# Patient Record
Sex: Female | Born: 1965
Health system: Southern US, Community
[De-identification: ages and names within clinical notes are randomized; demographics above are authoritative.]

## PROBLEM LIST (undated history)

## (undated) DIAGNOSIS — Z21 Asymptomatic human immunodeficiency virus [HIV] infection status: Secondary | ICD-10-CM

## (undated) DIAGNOSIS — I739 Peripheral vascular disease, unspecified: Secondary | ICD-10-CM

## (undated) DIAGNOSIS — Z973 Presence of spectacles and contact lenses: Secondary | ICD-10-CM

## (undated) DIAGNOSIS — E119 Type 2 diabetes mellitus without complications: Secondary | ICD-10-CM

## (undated) DIAGNOSIS — I499 Cardiac arrhythmia, unspecified: Secondary | ICD-10-CM

## (undated) DIAGNOSIS — N289 Disorder of kidney and ureter, unspecified: Secondary | ICD-10-CM

## (undated) DIAGNOSIS — C801 Malignant (primary) neoplasm, unspecified: Secondary | ICD-10-CM

## (undated) DIAGNOSIS — B20 Human immunodeficiency virus [HIV] disease: Secondary | ICD-10-CM

## (undated) DIAGNOSIS — T8859XA Other complications of anesthesia, initial encounter: Secondary | ICD-10-CM

## (undated) DIAGNOSIS — J45909 Unspecified asthma, uncomplicated: Secondary | ICD-10-CM

## (undated) DIAGNOSIS — H548 Legal blindness, as defined in USA: Secondary | ICD-10-CM

## (undated) DIAGNOSIS — Z8619 Personal history of other infectious and parasitic diseases: Secondary | ICD-10-CM

## (undated) DIAGNOSIS — Z9889 Other specified postprocedural states: Secondary | ICD-10-CM

## (undated) DIAGNOSIS — R002 Palpitations: Secondary | ICD-10-CM

## (undated) DIAGNOSIS — N939 Abnormal uterine and vaginal bleeding, unspecified: Secondary | ICD-10-CM

## (undated) DIAGNOSIS — M1990 Unspecified osteoarthritis, unspecified site: Secondary | ICD-10-CM

## (undated) DIAGNOSIS — I10 Essential (primary) hypertension: Secondary | ICD-10-CM

## (undated) DIAGNOSIS — A159 Respiratory tuberculosis unspecified: Secondary | ICD-10-CM

## (undated) DIAGNOSIS — D071 Carcinoma in situ of vulva: Secondary | ICD-10-CM

## (undated) DIAGNOSIS — E7800 Pure hypercholesterolemia, unspecified: Secondary | ICD-10-CM

## (undated) DIAGNOSIS — Q248 Other specified congenital malformations of heart: Secondary | ICD-10-CM

## (undated) DIAGNOSIS — J189 Pneumonia, unspecified organism: Secondary | ICD-10-CM

## (undated) DIAGNOSIS — D259 Leiomyoma of uterus, unspecified: Secondary | ICD-10-CM

## (undated) DIAGNOSIS — N182 Chronic kidney disease, stage 2 (mild): Secondary | ICD-10-CM

## (undated) DIAGNOSIS — K219 Gastro-esophageal reflux disease without esophagitis: Secondary | ICD-10-CM

## (undated) DIAGNOSIS — R7989 Other specified abnormal findings of blood chemistry: Secondary | ICD-10-CM

## (undated) HISTORY — PX: EYE SURGERY: SHX253

---

## 2002-12-18 DIAGNOSIS — G510 Bell's palsy: Secondary | ICD-10-CM

## 2002-12-18 HISTORY — PX: OTHER SURGICAL HISTORY: SHX169

## 2002-12-18 HISTORY — DX: Bell's palsy: G51.0

## 2004-12-18 HISTORY — PX: CORNEAL TRANSPLANT: SHX108

## 2006-12-18 HISTORY — PX: KNEE ARTHROSCOPY W/ ACL RECONSTRUCTION: SHX1858

## 2015-01-02 ENCOUNTER — Emergency Department (HOSPITAL_BASED_OUTPATIENT_CLINIC_OR_DEPARTMENT_OTHER)
Admission: EM | Admit: 2015-01-02 | Discharge: 2015-01-02 | Disposition: A | Discharge status: Home / Self Care | Payer: Medicare Other | Attending: Emergency Medicine | Admitting: Emergency Medicine

## 2015-01-02 ENCOUNTER — Encounter (HOSPITAL_BASED_OUTPATIENT_CLINIC_OR_DEPARTMENT_OTHER): Payer: Self-pay | Admitting: Emergency Medicine

## 2015-01-02 DIAGNOSIS — R519 Headache, unspecified: Secondary | ICD-10-CM

## 2015-01-02 DIAGNOSIS — I10 Essential (primary) hypertension: Secondary | ICD-10-CM | POA: Diagnosis not present

## 2015-01-02 DIAGNOSIS — Z794 Long term (current) use of insulin: Secondary | ICD-10-CM | POA: Insufficient documentation

## 2015-01-02 DIAGNOSIS — R51 Headache: Secondary | ICD-10-CM | POA: Diagnosis present

## 2015-01-02 DIAGNOSIS — E1165 Type 2 diabetes mellitus with hyperglycemia: Secondary | ICD-10-CM | POA: Insufficient documentation

## 2015-01-02 DIAGNOSIS — Z79899 Other long term (current) drug therapy: Secondary | ICD-10-CM | POA: Insufficient documentation

## 2015-01-02 DIAGNOSIS — R030 Elevated blood-pressure reading, without diagnosis of hypertension: Secondary | ICD-10-CM

## 2015-01-02 DIAGNOSIS — IMO0001 Reserved for inherently not codable concepts without codable children: Secondary | ICD-10-CM

## 2015-01-02 DIAGNOSIS — Z760 Encounter for issue of repeat prescription: Secondary | ICD-10-CM | POA: Diagnosis not present

## 2015-01-02 DIAGNOSIS — R739 Hyperglycemia, unspecified: Secondary | ICD-10-CM

## 2015-01-02 HISTORY — DX: Type 2 diabetes mellitus without complications: E11.9

## 2015-01-02 HISTORY — DX: Essential (primary) hypertension: I10

## 2015-01-02 LAB — CBG MONITORING, ED: Glucose-Capillary: 275 mg/dL — ABNORMAL HIGH (ref 70–99)

## 2015-01-02 MED ORDER — LISINOPRIL 10 MG PO TABS: 10.0000 mg | ORAL_TABLET | Freq: Every day | ORAL | Status: DC

## 2015-01-02 MED ORDER — ACETAMINOPHEN 500 MG PO TABS
1000.0000 mg | ORAL_TABLET | Freq: Once | ORAL | Status: AC
  Administered 2015-01-02: 1000 mg via ORAL
  Filled 2015-01-02: qty 2

## 2015-01-02 MED ORDER — INSULIN NPH ISOPHANE & REGULAR (70-30) 100 UNIT/ML ~~LOC~~ SUSP: 37.0000 [IU] | Freq: Two times a day (BID) | SUBCUTANEOUS | Status: DC

## 2015-01-02 MED ORDER — HYDROCHLOROTHIAZIDE 25 MG PO TABS: 25.0000 mg | ORAL_TABLET | Freq: Every day | ORAL | Status: DC

## 2015-01-02 MED ORDER — INSULIN ASPART 100 UNIT/ML ~~LOC~~ SOLN
10.0000 [IU] | Freq: Once | SUBCUTANEOUS | Status: AC
  Administered 2015-01-02: 10 [IU] via SUBCUTANEOUS
  Filled 2015-01-02: qty 1

## 2015-01-02 MED ORDER — LISINOPRIL 10 MG PO TABS
10.0000 mg | ORAL_TABLET | Freq: Once | ORAL | Status: AC
  Administered 2015-01-02: 10 mg via ORAL
  Filled 2015-01-02: qty 1

## 2015-01-02 MED ORDER — HYDROCHLOROTHIAZIDE 25 MG PO TABS
25.0000 mg | ORAL_TABLET | Freq: Once | ORAL | Status: AC
  Administered 2015-01-02: 25 mg via ORAL
  Filled 2015-01-02: qty 1

## 2015-01-02 MED ORDER — METFORMIN HCL 500 MG PO TABS: 500.0000 mg | ORAL_TABLET | Freq: Two times a day (BID) | ORAL | Status: DC

## 2015-01-02 NOTE — ED Provider Notes (Signed)
CSN: [id number]     Arrival date & time 01/02/15  1344 History   First MD Initiated Contact with Patient 01/02/15 1401     Chief Complaint  Patient presents with  . Headache     (Consider location/radiation/quality/duration/timing/severity/associated sxs/prior Treatment) HPI Comments: 49 year old female with a past medical history of diabetes, hypertension and enlarged heart presenting complaining of gradual onset headache 5 days. Patient reports headaches have been intermittent, located behind her eyes described as pressure-like and throbbing, worse at night. She feels like her blood pressure is high. She recently moved to the area from [place] and has been out of her blood pressure and diabetes medications over the past few days. She decreased her insulin dosing in order to get through until she could find doctor. She is supposed to take metformin, 500 mg twice a day, lisinopril 10 mg and hydrochlorothiazide 25 mg daily. She also takes NovoLog insulin twice daily, 37 mg. Denies vision changes, confusion, numbness, tingling, weakness, nausea, vomiting, photophobia or phonophobia, chest pain, shortness of breath, fever or chills.  Patient is a 49 y.o. female presenting with headaches. The history is provided by the patient and a friend.  Headache   Past Medical History  Diagnosis Date  . Diabetes mellitus without complication   . Hypertension   . Enlarged heart    Past Surgical History  Procedure Laterality Date  . Eye surgery    . Corneal transplant    . Cesarean section     No family history on file. History  Substance Use Topics  . Smoking status: Never Smoker   . Smokeless tobacco: Not on file  . Alcohol Use: Not on file   OB History    No data available     Review of Systems  Neurological: Positive for headaches.  All other systems reviewed and are negative.     Allergies  Bactrim  Home Medications   Prior to Admission medications   Medication Sig Start Date  End Date Taking? Authorizing Provider  hydrochlorothiazide (HYDRODIURIL) 25 MG tablet Take 1 tablet (25 mg total) by mouth daily. 01/02/15   Siobhan Zaro M Lesslie Mckeehan, PA-C  insulin NPH-regular Human (NOVOLIN 70/30) (70-30) 100 UNIT/ML injection Inject 37 Units into the skin 2 (two) times daily. 01/02/15   Gadge Hermiz M Kacelyn Rowzee, PA-C  lisinopril (PRINIVIL,ZESTRIL) 10 MG tablet Take 1 tablet (10 mg total) by mouth daily. 01/02/15   Carman Ching, PA-C  metFORMIN (GLUCOPHAGE) 500 MG tablet Take 1 tablet (500 mg total) by mouth 2 (two) times daily with a meal. 01/02/15   Shubh Chiara M Jona Erkkila, PA-C   BP 184/101 mmHg  Pulse 91  Temp(Src) 98.3 F (36.8 C) (Oral)  Resp 18  Ht 5\' 9"  (1.753 m)  Wt 247 lb (112.038 kg)  BMI 36.46 kg/m2  SpO2 98%  LMP 08/02/2014 Physical Exam  Constitutional: She is oriented to person, place, and time. She appears well-developed and well-nourished. No distress.  HENT:  Head: Normocephalic and atraumatic.  Mouth/Throat: Oropharynx is clear and moist.  Eyes: Conjunctivae and EOM are normal.  Right pupil round and reactive to light, left corneal transplant.  Neck: Normal range of motion. Neck supple.  No meningeal signs.  Cardiovascular: Normal rate, regular rhythm, normal heart sounds and intact distal pulses.   Pulmonary/Chest: Effort normal and breath sounds normal. No respiratory distress.  Abdominal: Soft. Bowel sounds are normal. There is no tenderness.  Musculoskeletal: Normal range of motion. She exhibits no edema.  Neurological: She is alert and oriented  to person, place, and time. She has normal strength. No cranial nerve deficit or sensory deficit. Coordination and gait normal.  Speech fluent, goal oriented. Equal grip strength bilateral. Moves limbs without ataxia.  Skin: Skin is warm and dry. No rash noted. She is not diaphoretic.  Psychiatric: She has a normal mood and affect. Her behavior is normal.  Nursing note and vitals reviewed.   ED Course  Procedures (including critical  care time) Labs Review Labs Reviewed  CBG MONITORING, ED - Abnormal; Notable for the following:    Glucose-Capillary 275 (*)    All other components within normal limits    Imaging Review No results found.   EKG Interpretation None      MDM   Final diagnoses:  Generalized headache  Elevated blood pressure  Hyperglycemia  Medication refill   Patient in no apparent distress. Hypertensive on arrival at 184/101, vitals otherwise stable. Headache most likely from high blood pressure. No associated chest pain, shortness of breath or vision changes. No red flags concerning patient's headache. No meningeal signs, no thunderclap appearance, no focal neurologic deficits. Doubt CVA, SAH, ICH or meningitis. She has been off of her medications as stated above. Headache significantly improved after receiving her daily blood pressure medications. She was also given her metformin and insulin dose for the day. Discussed importance of medication compliance, prescriptions for her home medications given. Resources given for PCP follow-up. BP at discharge 176/90. Stable for discharge. Return precautions given. Patient states understanding of treatment care plan and is agreeable.  Carman Ching, PA-C 01/02/15 [place], DO 01/03/15 [id number]

## 2015-01-02 NOTE — ED Notes (Signed)
Pt presents to ED with complaints of headache since monday

## 2015-01-02 NOTE — Discharge Instructions (Signed)
It is very important for you to take your medications daily. Follow up with one of the resources below to establish care with a primary care doctor, or the [facility].  Headaches, Frequently Asked Questions MIGRAINE HEADACHES Q: What is migraine? What causes it? How can I treat it? A: Generally, migraine headaches begin as a dull ache. Then they develop into a constant, throbbing, and pulsating pain. You may experience pain at the temples. You may experience pain at the front or back of one or both sides of the head. The pain is usually accompanied by a combination of:  Nausea.  Vomiting.  Sensitivity to light and noise. Some people (about 15%) experience an aura (see below) before an attack. The cause of migraine is believed to be chemical reactions in the brain. Treatment for migraine may include over-the-counter or prescription medications. It may also include self-help techniques. These include relaxation training and biofeedback.  Q: What is an aura? A: About 15% of people with migraine get an "aura". This is a sign of neurological symptoms that occur before a migraine headache. You may see wavy or jagged lines, dots, or flashing lights. You might experience tunnel vision or blind spots in one or both eyes. The aura can include visual or auditory hallucinations (something imagined). It may include disruptions in smell (such as strange odors), taste or touch. Other symptoms include:  Numbness.  A "pins and needles" sensation.  Difficulty in recalling or speaking the correct word. These neurological events may last as long as 60 minutes. These symptoms will fade as the headache begins. Q: What is a trigger? A: Certain physical or environmental factors can lead to or "trigger" a migraine. These include:  Foods.  Hormonal changes.  Weather.  Stress. It is important to remember that triggers are different for everyone. To help prevent migraine attacks, you need to figure out  which triggers affect you. Keep a headache diary. This is a good way to track triggers. The diary will help you talk to your healthcare professional about your condition. Q: Does weather affect migraines? A: Bright sunshine, hot, humid conditions, and drastic changes in barometric pressure may lead to, or "trigger," a migraine attack in some people. But studies have shown that weather does not act as a trigger for everyone with migraines. Q: What is the link between migraine and hormones? A: Hormones start and regulate many of your body's functions. Hormones keep your body in balance within a constantly changing environment. The levels of hormones in your body are unbalanced at times. Examples are during menstruation, pregnancy, or menopause. That can lead to a migraine attack. In fact, about three quarters of all women with migraine report that their attacks are related to the menstrual cycle.  Q: Is there an increased risk of stroke for migraine sufferers? A: The likelihood of a migraine attack causing a stroke is very remote. That is not to say that migraine sufferers cannot have a stroke associated with their migraines. In persons under age 87, the most common associated factor for stroke is migraine headache. But over the course of a person's normal life span, the occurrence of migraine headache may actually be associated with a reduced risk of dying from cerebrovascular disease due to stroke.  Q: What are acute medications for migraine? A: Acute medications are used to treat the pain of the headache after it has started. Examples over-the-counter medications, NSAIDs, ergots, and triptans.  Q: What are the triptans? A: Triptans are the newest  class of abortive medications. They are specifically targeted to treat migraine. Triptans are vasoconstrictors. They moderate some chemical reactions in the brain. The triptans work on receptors in your brain. Triptans help to restore the balance of a  neurotransmitter called serotonin. Fluctuations in levels of serotonin are thought to be a main cause of migraine.  Q: Are over-the-counter medications for migraine effective? A: Over-the-counter, or "OTC," medications may be effective in relieving mild to moderate pain and associated symptoms of migraine. But you should see your caregiver before beginning any treatment regimen for migraine.  Q: What are preventive medications for migraine? A: Preventive medications for migraine are sometimes referred to as "prophylactic" treatments. They are used to reduce the frequency, severity, and length of migraine attacks. Examples of preventive medications include antiepileptic medications, antidepressants, beta-blockers, calcium channel blockers, and NSAIDs (nonsteroidal anti-inflammatory drugs). Q: Why are anticonvulsants used to treat migraine? A: During the past few years, there has been an increased interest in antiepileptic drugs for the prevention of migraine. They are sometimes referred to as "anticonvulsants". Both epilepsy and migraine may be caused by similar reactions in the brain.  Q: Why are antidepressants used to treat migraine? A: Antidepressants are typically used to treat people with depression. They may reduce migraine frequency by regulating chemical levels, such as serotonin, in the brain.  Q: What alternative therapies are used to treat migraine? A: The term "alternative therapies" is often used to describe treatments considered outside the scope of conventional Western medicine. Examples of alternative therapy include acupuncture, acupressure, and yoga. Another common alternative treatment is herbal therapy. Some herbs are believed to relieve headache pain. Always discuss alternative therapies with your caregiver before proceeding. Some herbal products contain arsenic and other toxins. TENSION HEADACHES Q: What is a tension-type headache? What causes it? How can I treat it? A:  Tension-type headaches occur randomly. They are often the result of temporary stress, anxiety, fatigue, or anger. Symptoms include soreness in your temples, a tightening band-like sensation around your head (a "vice-like" ache). Symptoms can also include a pulling feeling, pressure sensations, and contracting head and neck muscles. The headache begins in your forehead, temples, or the back of your head and neck. Treatment for tension-type headache may include over-the-counter or prescription medications. Treatment may also include self-help techniques such as relaxation training and biofeedback. CLUSTER HEADACHES Q: What is a cluster headache? What causes it? How can I treat it? A: Cluster headache gets its name because the attacks come in groups. The pain arrives with little, if any, warning. It is usually on one side of the head. A tearing or bloodshot eye and a runny nose on the same side of the headache may also accompany the pain. Cluster headaches are believed to be caused by chemical reactions in the brain. They have been described as the most severe and intense of any headache type. Treatment for cluster headache includes prescription medication and oxygen. SINUS HEADACHES Q: What is a sinus headache? What causes it? How can I treat it? A: When a cavity in the bones of the face and skull (a sinus) becomes inflamed, the inflammation will cause localized pain. This condition is usually the result of an allergic reaction, a tumor, or an infection. If your headache is caused by a sinus blockage, such as an infection, you will probably have a fever. An x-ray will confirm a sinus blockage. Your caregiver's treatment might include antibiotics for the infection, as well as antihistamines or decongestants.  REBOUND HEADACHES Q:  What is a rebound headache? What causes it? How can I treat it? A: A pattern of taking acute headache medications too often can lead to a condition known as "rebound headache." A  pattern of taking too much headache medication includes taking it more than 2 days per week or in excessive amounts. That means more than the label or a caregiver advises. With rebound headaches, your medications not only stop relieving pain, they actually begin to cause headaches. Doctors treat rebound headache by tapering the medication that is being overused. Sometimes your caregiver will gradually substitute a different type of treatment or medication. Stopping may be a challenge. Regularly overusing a medication increases the potential for serious side effects. Consult a caregiver if you regularly use headache medications more than 2 days per week or more than the label advises. ADDITIONAL QUESTIONS AND ANSWERS Q: What is biofeedback? A: Biofeedback is a self-help treatment. Biofeedback uses special equipment to monitor your body's involuntary physical responses. Biofeedback monitors:  Breathing.  Pulse.  Heart rate.  Temperature.  Muscle tension.  Brain activity. Biofeedback helps you refine and perfect your relaxation exercises. You learn to control the physical responses that are related to stress. Once the technique has been mastered, you do not need the equipment any more. Q: Are headaches hereditary? A: Four out of five (80%) of people that suffer report a family history of migraine. Scientists are not sure if this is genetic or a family predisposition. Despite the uncertainty, a child has a 50% chance of having migraine if one parent suffers. The child has a 75% chance if both parents suffer.  Q: Can children get headaches? A: By the time they reach high school, most young people have experienced some type of headache. Many safe and effective approaches or medications can prevent a headache from occurring or stop it after it has begun.  Q: What type of doctor should I see to diagnose and treat my headache? A: Start with your primary caregiver. Discuss his or her experience and  approach to headaches. Discuss methods of classification, diagnosis, and treatment. Your caregiver may decide to recommend you to a headache specialist, depending upon your symptoms or other physical conditions. Having diabetes, allergies, etc., may require a more comprehensive and inclusive approach to your headache. The National Headache Foundation will provide, upon request, a list of [facility] physician members in your state. Document Released: 02/24/2004 Document Revised: 02/26/2012 Document Reviewed: 08/03/2008 [facility] Patient Information 2015 [place], [place]. This information is not intended to replace advice given to you by your health care provider. Make sure you discuss any questions you have with your health care provider.  Hypertension Hypertension, commonly called high blood pressure, is when the force of blood pumping through your arteries is too strong. Your arteries are the blood vessels that carry blood from your heart throughout your body. A blood pressure reading consists of a higher number over a lower number, such as 110/72. The higher number (systolic) is the pressure inside your arteries when your heart pumps. The lower number (diastolic) is the pressure inside your arteries when your heart relaxes. Ideally you want your blood pressure below 120/80. Hypertension forces your heart to work harder to pump blood. Your arteries may become narrow or stiff. Having hypertension puts you at risk for heart disease, stroke, and other problems.  RISK FACTORS Some risk factors for high blood pressure are controllable. Others are not.  Risk factors you cannot control include:   Race. You may be at  higher risk if you are African American.  Age. Risk increases with age.  Gender. Men are at higher risk than women before age 93 years. After age 15, women are at higher risk than men. Risk factors you can control include:  Not getting enough exercise or physical activity.  Being  overweight.  Getting too much fat, sugar, calories, or salt in your diet.  Drinking too much alcohol. SIGNS AND SYMPTOMS Hypertension does not usually cause signs or symptoms. Extremely high blood pressure (hypertensive crisis) may cause headache, anxiety, shortness of breath, and nosebleed. DIAGNOSIS  To check if you have hypertension, your health care provider will measure your blood pressure while you are seated, with your arm held at the level of your heart. It should be measured at least twice using the same arm. Certain conditions can cause a difference in blood pressure between your right and left arms. A blood pressure reading that is higher than normal on one occasion does not mean that you need treatment. If one blood pressure reading is high, ask your health care provider about having it checked again. TREATMENT  Treating high blood pressure includes making lifestyle changes and possibly taking medicine. Living a healthy lifestyle can help lower high blood pressure. You may need to change some of your habits. Lifestyle changes may include:  Following the DASH diet. This diet is high in fruits, vegetables, and whole grains. It is low in salt, red meat, and added sugars.  Getting at least 2 hours of brisk physical activity every week.  Losing weight if necessary.  Not smoking.  Limiting alcoholic beverages.  Learning ways to reduce stress. If lifestyle changes are not enough to get your blood pressure under control, your health care provider may prescribe medicine. You may need to take more than one. Work closely with your health care provider to understand the risks and benefits. HOME CARE INSTRUCTIONS  Have your blood pressure rechecked as directed by your health care provider.   Take medicines only as directed by your health care provider. Follow the directions carefully. Blood pressure medicines must be taken as prescribed. The medicine does not work as well when you skip  doses. Skipping doses also puts you at risk for problems.   Do not smoke.   Monitor your blood pressure at home as directed by your health care provider. SEEK MEDICAL CARE IF:   You think you are having a reaction to medicines taken.  You have recurrent headaches or feel dizzy.  You have swelling in your ankles.  You have trouble with your vision. SEEK IMMEDIATE MEDICAL CARE IF:  You develop a severe headache or confusion.  You have unusual weakness, numbness, or feel faint.  You have severe chest or abdominal pain.  You vomit repeatedly.  You have trouble breathing. MAKE SURE YOU:   Understand these instructions.  Will watch your condition.  Will get help right away if you are not doing well or get worse. Document Released: 12/04/2005 Document Revised: 04/20/2014 Document Reviewed: 09/26/2013 [facility] Patient Information 2015 [place], [place]. This information is not intended to replace advice given to you by your health care provider. Make sure you discuss any questions you have with your health care provider.  Hyperglycemia Hyperglycemia occurs when the glucose (sugar) in your blood is too high. Hyperglycemia can happen for many reasons, but it most often happens to people who do not know they have diabetes or are not managing their diabetes properly.  CAUSES  Whether you have  diabetes or not, there are other causes of hyperglycemia. Hyperglycemia can occur when you have diabetes, but it can also occur in other situations that you might not be as aware of, such as: Diabetes  If you have diabetes and are having problems controlling your blood glucose, hyperglycemia could occur because of some of the following reasons:  Not following your meal plan.  Not taking your diabetes medications or not taking it properly.  Exercising less or doing less activity than you normally do.  Being sick. Pre-diabetes  This cannot be ignored. Before people develop Type 2 diabetes,  they almost always have "pre-diabetes." This is when your blood glucose levels are higher than normal, but not yet high enough to be diagnosed as diabetes. Research has shown that some long-term damage to the body, especially the heart and circulatory system, may already be occurring during pre-diabetes. If you take action to manage your blood glucose when you have pre-diabetes, you may delay or prevent Type 2 diabetes from developing. Stress  If you have diabetes, you may be "diet" controlled or on oral medications or insulin to control your diabetes. However, you may find that your blood glucose is higher than usual in the hospital whether you have diabetes or not. This is often referred to as "stress hyperglycemia." Stress can elevate your blood glucose. This happens because of hormones put out by the body during times of stress. If stress has been the cause of your high blood glucose, it can be followed regularly by your caregiver. That way he/she can make sure your hyperglycemia does not continue to get worse or progress to diabetes. Steroids  Steroids are medications that act on the infection fighting system (immune system) to block inflammation or infection. One side effect can be a rise in blood glucose. Most people can produce enough extra insulin to allow for this rise, but for those who cannot, steroids make blood glucose levels go even higher. It is not unusual for steroid treatments to "uncover" diabetes that is developing. It is not always possible to determine if the hyperglycemia will go away after the steroids are stopped. A special blood test called an A1c is sometimes done to determine if your blood glucose was elevated before the steroids were started. SYMPTOMS  Thirsty.  Frequent urination.  Dry mouth.  Blurred vision.  Tired or fatigue.  Weakness.  Sleepy.  Tingling in feet or leg. DIAGNOSIS  Diagnosis is made by monitoring blood glucose in one or all of the following  ways:  A1c test. This is a chemical found in your blood.  Fingerstick blood glucose monitoring.  Laboratory results. TREATMENT  First, knowing the cause of the hyperglycemia is important before the hyperglycemia can be treated. Treatment may include, but is not be limited to:  Education.  Change or adjustment in medications.  Change or adjustment in meal plan.  Treatment for an illness, infection, etc.  More frequent blood glucose monitoring.  Change in exercise plan.  Decreasing or stopping steroids.  Lifestyle changes. HOME CARE INSTRUCTIONS   Test your blood glucose as directed.  Exercise regularly. Your caregiver will give you instructions about exercise. Pre-diabetes or diabetes which comes on with stress is helped by exercising.  Eat wholesome, balanced meals. Eat often and at regular, fixed times. Your caregiver or nutritionist will give you a meal plan to guide your sugar intake.  Being at an ideal weight is important. If needed, losing as little as 10 to 15 pounds may help improve  blood glucose levels. SEEK MEDICAL CARE IF:   You have questions about medicine, activity, or diet.  You continue to have symptoms (problems such as increased thirst, urination, or weight gain). SEEK IMMEDIATE MEDICAL CARE IF:   You are vomiting or have diarrhea.  Your breath smells fruity.  You are breathing faster or slower.  You are very sleepy or incoherent.  You have numbness, tingling, or pain in your feet or hands.  You have chest pain.  Your symptoms get worse even though you have been following your caregiver's orders.  If you have any other questions or concerns. Document Released: 05/30/2001 Document Revised: 02/26/2012 Document Reviewed: 04/01/2012 [facility] Patient Information 2015 [place], [place]. This information is not intended to replace advice given to you by your health care provider. Make sure you discuss any questions you have with your health care  provider.

## 2015-04-06 ENCOUNTER — Ambulatory Visit: Payer: Medicare Other

## 2015-09-28 ENCOUNTER — Other Ambulatory Visit: Payer: Self-pay | Admitting: Obstetrics and Gynecology

## 2015-09-28 ENCOUNTER — Other Ambulatory Visit (HOSPITAL_COMMUNITY)
Admission: RE | Admit: 2015-09-28 | Discharge: 2015-09-28 | Disposition: A | Discharge status: Home / Self Care | Payer: Medicare Other | Source: Ambulatory Visit | Attending: Obstetrics and Gynecology | Admitting: Obstetrics and Gynecology

## 2015-09-28 DIAGNOSIS — Z01419 Encounter for gynecological examination (general) (routine) without abnormal findings: Secondary | ICD-10-CM | POA: Diagnosis present

## 2015-09-28 DIAGNOSIS — Z1151 Encounter for screening for human papillomavirus (HPV): Secondary | ICD-10-CM | POA: Insufficient documentation

## 2015-09-29 LAB — CYTOLOGY - PAP

## 2015-10-13 ENCOUNTER — Other Ambulatory Visit (HOSPITAL_COMMUNITY)
Admission: RE | Admit: 2015-10-13 | Discharge: 2015-10-13 | Disposition: A | Discharge status: Home / Self Care | Payer: Medicare Other | Source: Ambulatory Visit | Attending: Internal Medicine | Admitting: Internal Medicine

## 2015-10-13 ENCOUNTER — Other Ambulatory Visit: Payer: Medicare Other

## 2015-10-13 ENCOUNTER — Other Ambulatory Visit: Payer: Self-pay

## 2015-10-13 DIAGNOSIS — Z113 Encounter for screening for infections with a predominantly sexual mode of transmission: Secondary | ICD-10-CM | POA: Diagnosis present

## 2015-10-13 DIAGNOSIS — B20 Human immunodeficiency virus [HIV] disease: Secondary | ICD-10-CM

## 2015-10-13 DIAGNOSIS — Z21 Asymptomatic human immunodeficiency virus [HIV] infection status: Secondary | ICD-10-CM

## 2015-10-13 LAB — CBC WITH DIFFERENTIAL/PLATELET
Basophils Absolute: 0 10*3/uL (ref 0.0–0.1)
Basophils Relative: 0 % (ref 0–1)
Eosinophils Absolute: 0.1 10*3/uL (ref 0.0–0.7)
Eosinophils Relative: 1 % (ref 0–5)
HCT: 37.4 % (ref 36.0–46.0)
Hemoglobin: 13.2 g/dL (ref 12.0–15.0)
Lymphocytes Relative: 32 % (ref 12–46)
Lymphs Abs: 3.1 10*3/uL (ref 0.7–4.0)
MCH: 32 pg (ref 26.0–34.0)
MCHC: 35.3 g/dL (ref 30.0–36.0)
MCV: 90.8 fL (ref 78.0–100.0)
MPV: 9.7 fL (ref 8.6–12.4)
Monocytes Absolute: 0.5 10*3/uL (ref 0.1–1.0)
Monocytes Relative: 5 % (ref 3–12)
Neutro Abs: 6.1 10*3/uL (ref 1.7–7.7)
Neutrophils Relative %: 62 % (ref 43–77)
Platelets: 427 10*3/uL — ABNORMAL HIGH (ref 150–400)
RBC: 4.12 MIL/uL (ref 3.87–5.11)
RDW: 13.5 % (ref 11.5–15.5)
WBC: 9.8 10*3/uL (ref 4.0–10.5)

## 2015-10-13 LAB — COMPLETE METABOLIC PANEL WITH GFR
ALT: 22 U/L (ref 6–29)
AST: 21 U/L (ref 10–35)
Albumin: 3.9 g/dL (ref 3.6–5.1)
Alkaline Phosphatase: 85 U/L (ref 33–115)
BUN: 15 mg/dL (ref 7–25)
CO2: 27 mmol/L (ref 20–31)
Calcium: 9.9 mg/dL (ref 8.6–10.2)
Chloride: 104 mmol/L (ref 98–110)
Creat: 0.74 mg/dL (ref 0.50–1.10)
GFR, Est African American: >89 mL/min (ref >=60)
GFR, Est Non African American: >89 mL/min (ref >=60)
Glucose, Bld: 91 mg/dL (ref 65–99)
Potassium: 4.4 mmol/L (ref 3.5–5.3)
Sodium: 142 mmol/L (ref 135–146)
Total Bilirubin: 0.3 mg/dL (ref 0.2–1.2)
Total Protein: 7.6 g/dL (ref 6.1–8.1)

## 2015-10-13 LAB — LIPID PANEL
Cholesterol: 203 mg/dL — ABNORMAL HIGH (ref 125–200)
HDL: 85 mg/dL (ref >=46)
LDL Cholesterol: 101 mg/dL (ref <130)
Total CHOL/HDL Ratio: 2.4 Ratio (ref <=5.0)
Triglycerides: 86 mg/dL (ref <150)
VLDL: 17 mg/dL (ref <30)

## 2015-10-14 LAB — T-HELPER CELL (CD4) - (RCID CLINIC ONLY)
CD4 % Helper T Cell: 16 % — ABNORMAL LOW (ref 33–55)
CD4 T Cell Abs: 500 /uL (ref 400–2700)

## 2015-10-14 LAB — RPR

## 2015-10-14 LAB — URINE CYTOLOGY ANCILLARY ONLY
Chlamydia: NEGATIVE
Neisseria Gonorrhea: NEGATIVE

## 2015-10-14 LAB — HEPATITIS C ANTIBODY: HCV Ab: NEGATIVE

## 2015-10-14 LAB — HIV-1 RNA ULTRAQUANT REFLEX TO GENTYP+
HIV 1 RNA Quant: 553 copies/mL — ABNORMAL HIGH (ref <20)
HIV-1 RNA Quant, Log: 2.74 Log copies/mL — ABNORMAL HIGH (ref <1.30)

## 2015-10-14 LAB — HEPATITIS B SURFACE ANTIBODY,QUALITATIVE: Hep B S Ab: NEGATIVE

## 2015-10-14 LAB — HEPATITIS B SURFACE ANTIGEN: Hepatitis B Surface Ag: NEGATIVE

## 2015-10-14 LAB — HEPATITIS B CORE ANTIBODY, TOTAL: Hep B Core Total Ab: NONREACTIVE

## 2015-10-14 LAB — HEPATITIS A ANTIBODY, TOTAL: Hep A Total Ab: NONREACTIVE

## 2015-10-15 ENCOUNTER — Encounter: Payer: Self-pay | Admitting: Gynecology

## 2015-10-15 ENCOUNTER — Ambulatory Visit: Payer: Medicare Other | Attending: Gynecology | Admitting: Gynecology

## 2015-10-15 VITALS — BP 153/80 | HR 18 | Temp 98.0°F | Resp 18 | Ht 69.0 in | Wt 242.3 lb

## 2015-10-15 DIAGNOSIS — D071 Carcinoma in situ of vulva: Secondary | ICD-10-CM | POA: Diagnosis not present

## 2015-10-15 NOTE — Patient Instructions (Signed)
Plan for a CO2 laser procedure of the vulva and wide local excision of the lesion on the mons pubis on December 9 with Dr. Fermin Schwab at the [facility]).  You will receive a phone call from the pre-surgical RN at the [facility] one to two days prior to your procedure to discuss your pre-op instructions.  Please call for any questions or concerns prior to that time.

## 2015-10-15 NOTE — Progress Notes (Signed)
Consult Note: Gyn-Onc   Katrina Wolfe 49 y.o. female  Chief Complaint  Patient presents with  . 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  Plan: I recommend laser vaporization of the vulvar lesions and wide local excision of the lesion on the mons. We will schedule the procedure be done as an outpatient on December 49  HPI: 49 year old [place] American female seen in consultation the request of Dr.Varnado regarding management of recurrent VIN 3. Patient presented with lesions on the vulva and mons which been biopsied showing both with severe dysplasia. Patient reports that she had laser vaporization of the vulva in 2010 in [place] for similar condition. She is not on any immunosuppressive therapies. Pap smears in the past of a normal period the patient is not had a menstrual period since July 2016.  Review of Systems:10 point review of systems is negative except as noted in interval history.   Vitals: Blood pressure 153/80, pulse 18, temperature 98 F (36.7 C), temperature source Oral, resp. rate 18, height 5\' 9"  (1.753 m), weight 242 lb 4.8 oz (109.907 kg), SpO2 100 %.  Physical Exam: General : The patient is a healthy woman in no acute distress.  HEENT: normocephalic, extraoccular movements normal; neck is supple without thyromegally  Lynphnodes: Supraclavicular and inguinal nodes not enlarged  Abdomen: Soft, non-tender, no ascites, no organomegally, no masses, no hernias  Pelvic:   Mons: There is a 2 cm lesion on the left mid mons which is somewhat exophytic. EGBUS: Normal female . There are raised lesions on the left labia minora and at the introitus in the midline and over to the right. There is no involvement of the anus. Vagina: Normal, no lesions  Urethra and Bladder: Normal, non-tender  Cervix: Surgically absent  Uterus: Surgically absent  Bi-manual examination: Non-tender; no adenxal  masses or nodularity  Rectal: normal sphincter tone, no masses, no blood  Lower extremities: No edema or varicosities. Normal range of motion      Allergies  Allergen Reactions  . Bactrim [Sulfamethoxazole-Trimethoprim] Hives    Past Medical History  Diagnosis Date  . Diabetes mellitus without complication ([place])   . Hypertension   . Enlarged heart     Past Surgical History  Procedure Laterality Date  . Eye surgery    . Corneal transplant    . Cesarean section    . Knee surgery Bilateral 01/2007    [place], [place] - per pt, she fell and had knee reconstruction    Current Outpatient Prescriptions  Medication Sig Dispense Refill  . atorvastatin (LIPITOR) 10 MG tablet     . ATRIPLA 600-200-300 MG tablet     . cloNIDine (CATAPRES) 0.3 MG tablet     . HUMALOG MIX 75/25 KWIKPEN (75-25) 100 UNIT/ML Kwikpen     . hydrochlorothiazide (HYDRODIURIL) 25 MG tablet Take 1 tablet (25 mg total) by mouth daily. 30 tablet 0  . lisinopril (PRINIVIL,ZESTRIL) 10 MG tablet Take 1 tablet (10 mg total) by mouth daily. 30 tablet 0  . metFORMIN (GLUCOPHAGE) 1000 MG tablet      No current facility-administered medications for this visit.    Social History   Social History  . Marital Status: Single    Spouse Name: N/A  . Number of Children: N/A  . Years of Education: N/A   Occupational History  . Not on file.   Social History Main Topics  .  Smoking status: Never Smoker   . Smokeless tobacco: Not on file  . Alcohol Use: No  . Drug Use: No  . Sexual Activity: Yes   Other Topics Concern  . Not on file   Social History Narrative    Family History  Problem Relation Age of Onset  . Hypertension Mother   . Diabetes Father   . Cancer Brother   . Breast cancer Layla Barter, MD 10/15/2015, 1:11 PM

## 2015-10-20 LAB — HLA B*5701: HLA-B*5701 w/rflx HLA-B High: NEGATIVE

## 2015-10-27 ENCOUNTER — Encounter: Payer: Self-pay | Admitting: Internal Medicine

## 2015-10-27 ENCOUNTER — Encounter: Payer: Self-pay | Admitting: *Deleted

## 2015-10-27 ENCOUNTER — Ambulatory Visit (INDEPENDENT_AMBULATORY_CARE_PROVIDER_SITE_OTHER): Payer: Medicare Other | Admitting: Internal Medicine

## 2015-10-27 VITALS — BP 174/96 | HR 71 | Temp 97.5°F | Ht 69.0 in | Wt 248.0 lb

## 2015-10-27 DIAGNOSIS — B20 Human immunodeficiency virus [HIV] disease: Secondary | ICD-10-CM | POA: Diagnosis not present

## 2015-10-27 DIAGNOSIS — E1122 Type 2 diabetes mellitus with diabetic chronic kidney disease: Secondary | ICD-10-CM

## 2015-10-27 DIAGNOSIS — I10 Essential (primary) hypertension: Secondary | ICD-10-CM

## 2015-10-27 DIAGNOSIS — E785 Hyperlipidemia, unspecified: Secondary | ICD-10-CM

## 2015-10-27 DIAGNOSIS — Z23 Encounter for immunization: Secondary | ICD-10-CM | POA: Diagnosis not present

## 2015-10-27 DIAGNOSIS — E119 Type 2 diabetes mellitus without complications: Secondary | ICD-10-CM | POA: Insufficient documentation

## 2015-10-27 MED ORDER — ELVITEG-COBIC-EMTRICIT-TENOFAF 150-150-200-10 MG PO TABS: 1.0000 | ORAL_TABLET | Freq: Every day | ORAL | Status: DC

## 2015-10-27 NOTE — Progress Notes (Signed)
Patient ID: Katrina Wolfe, female   DOB: 05/16/1966, 49 y.o.   MRN: [id number]      Rfv: establish care for HIV  Patient ID: Katrina Wolfe, female   DOB: [birth year], 49 y.o.   MRN: [id number]  HPI 49yo F with HIV disease, CD 4 count of 500/VL 553 ( oct 2016) on atripla. Also hx of HTN, DM, HLD.  Dr. Laney Potash, [place] followed her HIV disease. Dx 03/31/1997. Treated since 1998-03-31. Previously on kaletra/combivir then switched in 03-31-09 to [place]. Did run out of her atripla in the month august. Restarted on her atripla in September as dispensed by Dr. Deforest Hoyles.  Soc hx: Moved from [place], [place] roughly 1 year ago. Moved to be closer to her sister. Her husband is also HIV infected, who is also affiliated with the clinic but having difficult access to meds. Her son is 15yo. With autism. Goes to [employer]. She is a stay at home mom. Also has 19yo Son lives in [place]  PCP: dr. Lorenda Hatchet from [place].  Allergies  Allergen Reactions  . Bactrim [Sulfamethoxazole-Trimethoprim] Hives    Outpatient Encounter Prescriptions as of 10/27/2015  Medication Sig  . atorvastatin (LIPITOR) 10 MG tablet   . ATRIPLA 600-200-300 MG tablet   . cloNIDine (CATAPRES) 0.3 MG tablet   . HUMALOG MIX 75/25 KWIKPEN (75-25) 100 UNIT/ML Kwikpen   . hydrochlorothiazide (HYDRODIURIL) 25 MG tablet Take 1 tablet (25 mg total) by mouth daily.  Marland Kitchen lisinopril (PRINIVIL,ZESTRIL) 10 MG tablet Take 1 tablet (10 mg total) by mouth daily.  . metFORMIN (GLUCOPHAGE) 1000 MG tablet    No facility-administered encounter medications on file as of 10/27/2015.     Patient Active Problem List   Diagnosis Date Noted  . Severe vulvar dysplasia, histologically confirmed 10/15/2015  - hiv - htn - iddm2, poorly controlled - left eye blindness from childhood injury - bilateral knee surgery 08 - cornea transplant 06 - csection x 2;  84, 2001.  - hand surgery 01-Apr-2011 - cardiomyopathy? Childhood/ palpitations   Health Maintenance Due    Topic Date Due  . TETANUS/TDAP  08/16/1985  . INFLUENZA VACCINE  07/19/2015    Social History  Substance Use Topics  . Smoking status: Never Smoker   . Smokeless tobacco: Never Used  . Alcohol Use: No  family history includes Breast cancer in her cousin; Cancer in her brother; Diabetes in her father; Hypertension in her mother. young brother died of prostate ca in 03-31-00.   Review of Systems Review of Systems  Constitutional: Negative for fever, chills, diaphoresis, activity change, appetite change, fatigue and unexpected weight change.  HENT: Negative for congestion, sore throat, rhinorrhea, sneezing, trouble swallowing and sinus pressure.  Eyes: Negative for photophobia and visual disturbance.  Respiratory: Negative for cough, chest tightness, shortness of breath, wheezing and stridor.  Cardiovascular: Negative for chest pain, palpitations and leg swelling.  Gastrointestinal: Negative for nausea, vomiting, abdominal pain, diarrhea, constipation, blood in stool, abdominal distention and anal bleeding.  Genitourinary: Negative for dysuria, hematuria, flank pain and difficulty urinating.  Musculoskeletal: Negative for myalgias, back pain, joint swelling, arthralgias and gait problem.  Skin: Negative for color change, pallor, rash and wound.  Neurological: Negative for dizziness, tremors, weakness and light-headedness.  Hematological: Negative for adenopathy. Does not bruise/bleed easily.  Psychiatric/Behavioral: Negative for behavioral problems, confusion, sleep disturbance, dysphoric mood, decreased concentration and agitation.    Physical Exam   BP 174/96 mmHg  Pulse 71  Temp(Src) 97.5 F (36.4 C) (Oral)  Ht 5\' 9"  (1.753 m)  Wt 248 lb (112.492 kg)  BMI 36.61 kg/m2  LMP 06/18/2015 Physical Exam  Constitutional:  oriented to person, place, and time. appears well-developed and well-nourished. No distress.  HENT: [place]/AT, PERRLA, no scleral icterus Mouth/Throat: Oropharynx is clear  and moist. No oropharyngeal exudate.  Cardiovascular: Normal rate, regular rhythm and normal heart sounds. Exam reveals no gallop and no friction rub.  No murmur heard.  Pulmonary/Chest: Effort normal and breath sounds normal. No respiratory distress.  has no wheezes.  Neck = supple, no nuchal rigidity Abdominal: Soft. Bowel sounds are normal.  exhibits no distension. There is no tenderness.  Lymphadenopathy: no cervical adenopathy. No axillary adenopathy Neurological: alert and oriented to person, place, and time.  Skin: Skin is warm and dry. No rash noted. No erythema.  Psychiatric: a normal mood and affect.  behavior is normal.   Lab Results  Component Value Date   CD4TCELL 16* 10/13/2015   Lab Results  Component Value Date   CD4TABS 500 10/13/2015   Lab Results  Component Value Date   HIV1RNAQUANT 553* 10/13/2015   Lab Results  Component Value Date   HEPBSAB NEG 10/13/2015   No results found for: RPR  CBC Lab Results  Component Value Date   WBC 9.8 10/13/2015   RBC 4.12 10/13/2015   HGB 13.2 10/13/2015   HCT 37.4 10/13/2015   PLT 427* 10/13/2015   MCV 90.8 10/13/2015   MCH 32.0 10/13/2015   MCHC 35.3 10/13/2015   RDW 13.5 10/13/2015   LYMPHSABS 3.1 10/13/2015   MONOABS 0.5 10/13/2015   EOSABS 0.1 10/13/2015   BASOSABS 0.0 10/13/2015   BMET Lab Results  Component Value Date   NA 142 10/13/2015   K 4.4 10/13/2015   CL 104 10/13/2015   CO2 27 10/13/2015   GLUCOSE 91 10/13/2015   BUN 15 10/13/2015   CREATININE 0.74 10/13/2015   CALCIUM 9.9 10/13/2015   GFRNONAA >89 10/13/2015   GFRAA >89 10/13/2015     Assessment and Plan  hiv disease = will change her to genvoya. Will give co pay card, also discuss how to do adherence  Immunization = will give flu vaccination  htn = will have her follow up with dr. Carlisle Cater, insulin dependent = continue on current regimen. Encourage to check hgba1c at next visit to see if at goal  Hx of vulvar dysplasia  =will need to check pap at next visit  Hyperlipidemia = will need to check lipids. See if need to change dose on lipid lowering agent

## 2015-11-22 ENCOUNTER — Encounter (HOSPITAL_BASED_OUTPATIENT_CLINIC_OR_DEPARTMENT_OTHER): Payer: Self-pay | Admitting: *Deleted

## 2015-11-22 NOTE — Progress Notes (Signed)
NPO AFTER MN.  ARRIVE AT [id number].  NEED ISTAT AND EKG.  WILL TAKE LIPITOR, GENVOYA, AND CLONIDINE AM DOS W/ SIPS OF WATER.   PT HAS MEDICAL TRANSPORT TO AND FROM FACILITY, CONTACT [phone].  HUSBAND WILL BE CAREGIVER AT HOME.

## 2015-11-24 ENCOUNTER — Other Ambulatory Visit: Payer: Medicare Other

## 2015-11-24 DIAGNOSIS — B20 Human immunodeficiency virus [HIV] disease: Secondary | ICD-10-CM

## 2015-11-25 LAB — T-HELPER CELL (CD4) - (RCID CLINIC ONLY)
CD4 % Helper T Cell: 15 % — ABNORMAL LOW (ref 33–55)
CD4 T Cell Abs: 440 /uL (ref 400–2700)

## 2015-11-25 LAB — HIV-1 RNA QUANT-NO REFLEX-BLD
HIV 1 RNA Quant: 71 copies/mL — ABNORMAL HIGH (ref <20)
HIV-1 RNA Quant, Log: 1.85 Log copies/mL — ABNORMAL HIGH (ref <1.30)

## 2015-11-25 NOTE — Anesthesia Preprocedure Evaluation (Addendum)
Anesthesia Evaluation  Patient identified by MRN, date of birth, ID band Patient awake    Reviewed: Allergy & Precautions, NPO status , Patient's Chart, lab work & pertinent test results  Airway Mallampati: II  TM Distance: >3 FB Neck ROM: Full    Dental no notable dental hx.    Pulmonary neg pulmonary ROS,    Pulmonary exam normal breath sounds clear to auscultation       Cardiovascular hypertension, Pt. on medications Normal cardiovascular exam Rhythm:Regular Rate:Normal     Neuro/Psych negative neurological ROS  negative psych ROS   GI/Hepatic negative GI ROS, Neg liver ROS, GERD  ,  Endo/Other  diabetes, Type 2, Insulin Dependent, Oral Hypoglycemic Agents  Renal/GU Renal disease  negative genitourinary   Musculoskeletal negative musculoskeletal ROS (+)   Abdominal (+) + obese,   Peds negative pediatric ROS (+)  Hematology negative hematology ROS (+)   Anesthesia Other Findings   Reproductive/Obstetrics negative OB ROS                            Anesthesia Physical Anesthesia Plan  ASA: II  Anesthesia Plan: MAC   Post-op Pain Management:    Induction: Intravenous  Airway Management Planned:   Additional Equipment:   Intra-op Plan:   Post-operative Plan:   Informed Consent: I have reviewed the patients History and Physical, chart, labs and discussed the procedure including the risks, benefits and alternatives for the proposed anesthesia with the patient or authorized representative who has indicated his/her understanding and acceptance.   Dental advisory given  Plan Discussed with: CRNA  Anesthesia Plan Comments:         Anesthesia Quick Evaluation

## 2015-11-26 ENCOUNTER — Encounter (HOSPITAL_BASED_OUTPATIENT_CLINIC_OR_DEPARTMENT_OTHER)
Admission: RE | Disposition: A | Discharge status: Home / Self Care | Payer: Self-pay | Source: Ambulatory Visit | Attending: Gynecology

## 2015-11-26 ENCOUNTER — Emergency Department (HOSPITAL_COMMUNITY)
Admission: EM | Admit: 2015-11-26 | Discharge: 2015-11-26 | Disposition: A | Discharge status: Home / Self Care | Payer: Medicare Other | Source: Home / Self Care | Attending: Emergency Medicine | Admitting: Emergency Medicine

## 2015-11-26 ENCOUNTER — Ambulatory Visit (HOSPITAL_BASED_OUTPATIENT_CLINIC_OR_DEPARTMENT_OTHER): Payer: Medicare Other | Admitting: Anesthesiology

## 2015-11-26 ENCOUNTER — Encounter (HOSPITAL_COMMUNITY): Payer: Self-pay | Admitting: *Deleted

## 2015-11-26 ENCOUNTER — Encounter (HOSPITAL_BASED_OUTPATIENT_CLINIC_OR_DEPARTMENT_OTHER): Payer: Self-pay

## 2015-11-26 ENCOUNTER — Ambulatory Visit (HOSPITAL_BASED_OUTPATIENT_CLINIC_OR_DEPARTMENT_OTHER)
Admission: RE | Admit: 2015-11-26 | Discharge: 2015-11-26 | Disposition: A | Discharge status: Home / Self Care | Payer: Medicare Other | Source: Ambulatory Visit | Attending: Gynecology | Admitting: Gynecology

## 2015-11-26 DIAGNOSIS — Z794 Long term (current) use of insulin: Secondary | ICD-10-CM

## 2015-11-26 DIAGNOSIS — Z7984 Long term (current) use of oral hypoglycemic drugs: Secondary | ICD-10-CM | POA: Insufficient documentation

## 2015-11-26 DIAGNOSIS — Z86018 Personal history of other benign neoplasm: Secondary | ICD-10-CM | POA: Insufficient documentation

## 2015-11-26 DIAGNOSIS — R739 Hyperglycemia, unspecified: Secondary | ICD-10-CM

## 2015-11-26 DIAGNOSIS — Z8739 Personal history of other diseases of the musculoskeletal system and connective tissue: Secondary | ICD-10-CM

## 2015-11-26 DIAGNOSIS — M170 Bilateral primary osteoarthritis of knee: Secondary | ICD-10-CM | POA: Diagnosis not present

## 2015-11-26 DIAGNOSIS — N182 Chronic kidney disease, stage 2 (mild): Secondary | ICD-10-CM | POA: Insufficient documentation

## 2015-11-26 DIAGNOSIS — K219 Gastro-esophageal reflux disease without esophagitis: Secondary | ICD-10-CM | POA: Insufficient documentation

## 2015-11-26 DIAGNOSIS — Z8742 Personal history of other diseases of the female genital tract: Secondary | ICD-10-CM

## 2015-11-26 DIAGNOSIS — Z87412 Personal history of vulvar dysplasia: Secondary | ICD-10-CM | POA: Insufficient documentation

## 2015-11-26 DIAGNOSIS — Q248 Other specified congenital malformations of heart: Secondary | ICD-10-CM

## 2015-11-26 DIAGNOSIS — Z5309 Procedure and treatment not carried out because of other contraindication: Secondary | ICD-10-CM | POA: Insufficient documentation

## 2015-11-26 DIAGNOSIS — Z79899 Other long term (current) drug therapy: Secondary | ICD-10-CM | POA: Insufficient documentation

## 2015-11-26 DIAGNOSIS — B20 Human immunodeficiency virus [HIV] disease: Secondary | ICD-10-CM

## 2015-11-26 DIAGNOSIS — N9489 Other specified conditions associated with female genital organs and menstrual cycle: Secondary | ICD-10-CM | POA: Diagnosis not present

## 2015-11-26 DIAGNOSIS — E1165 Type 2 diabetes mellitus with hyperglycemia: Secondary | ICD-10-CM | POA: Insufficient documentation

## 2015-11-26 DIAGNOSIS — I129 Hypertensive chronic kidney disease with stage 1 through stage 4 chronic kidney disease, or unspecified chronic kidney disease: Secondary | ICD-10-CM | POA: Diagnosis not present

## 2015-11-26 DIAGNOSIS — E1122 Type 2 diabetes mellitus with diabetic chronic kidney disease: Secondary | ICD-10-CM

## 2015-11-26 DIAGNOSIS — H548 Legal blindness, as defined in USA: Secondary | ICD-10-CM

## 2015-11-26 DIAGNOSIS — I2510 Atherosclerotic heart disease of native coronary artery without angina pectoris: Secondary | ICD-10-CM | POA: Diagnosis not present

## 2015-11-26 DIAGNOSIS — H5442 Blindness, left eye, normal vision right eye: Secondary | ICD-10-CM | POA: Diagnosis not present

## 2015-11-26 HISTORY — DX: Human immunodeficiency virus (HIV) disease: B20

## 2015-11-26 HISTORY — DX: Asymptomatic human immunodeficiency virus (hiv) infection status: Z21

## 2015-11-26 HISTORY — DX: Gastro-esophageal reflux disease without esophagitis: K21.9

## 2015-11-26 HISTORY — DX: Other specified congenital malformations of heart: Q24.8

## 2015-11-26 HISTORY — DX: Chronic kidney disease, stage 2 (mild): N18.2

## 2015-11-26 HISTORY — DX: Abnormal uterine and vaginal bleeding, unspecified: N93.9

## 2015-11-26 HISTORY — DX: Leiomyoma of uterus, unspecified: D25.9

## 2015-11-26 HISTORY — DX: Carcinoma in situ of vulva: D07.1

## 2015-11-26 HISTORY — DX: Personal history of other infectious and parasitic diseases: Z86.19

## 2015-11-26 HISTORY — PX: CO2 LASER APPLICATION: SHX5778

## 2015-11-26 HISTORY — DX: Palpitations: R00.2

## 2015-11-26 HISTORY — DX: Presence of spectacles and contact lenses: Z97.3

## 2015-11-26 HISTORY — DX: Legal blindness, as defined in USA: H54.8

## 2015-11-26 HISTORY — DX: Unspecified osteoarthritis, unspecified site: M19.90

## 2015-11-26 HISTORY — DX: Type 2 diabetes mellitus without complications: E11.9

## 2015-11-26 LAB — CBC WITH DIFFERENTIAL/PLATELET
Basophils Absolute: 0 10*3/uL (ref 0.0–0.1)
Basophils Relative: 0 %
Eosinophils Absolute: 0.1 10*3/uL (ref 0.0–0.7)
Eosinophils Relative: 1 %
HCT: 38.5 % (ref 36.0–46.0)
Hemoglobin: 13 g/dL (ref 12.0–15.0)
Lymphocytes Relative: 25 %
Lymphs Abs: 2.3 10*3/uL (ref 0.7–4.0)
MCH: 31.9 pg (ref 26.0–34.0)
MCHC: 33.8 g/dL (ref 30.0–36.0)
MCV: 94.6 fL (ref 78.0–100.0)
Monocytes Absolute: 0.7 10*3/uL (ref 0.1–1.0)
Monocytes Relative: 7 %
Neutro Abs: 6.1 10*3/uL (ref 1.7–7.7)
Neutrophils Relative %: 67 %
Platelets: 285 10*3/uL (ref 150–400)
RBC: 4.07 MIL/uL (ref 3.87–5.11)
RDW: 13.1 % (ref 11.5–15.5)
WBC: 9.2 10*3/uL (ref 4.0–10.5)

## 2015-11-26 LAB — COMPREHENSIVE METABOLIC PANEL
ALT: 14 U/L (ref 14–54)
AST: 15 U/L (ref 15–41)
Albumin: 3.7 g/dL (ref 3.5–5.0)
Alkaline Phosphatase: 69 U/L (ref 38–126)
Anion gap: 10 (ref 5–15)
BUN: 21 mg/dL — ABNORMAL HIGH (ref 6–20)
CO2: 22 mmol/L (ref 22–32)
Calcium: 9.3 mg/dL (ref 8.9–10.3)
Chloride: 104 mmol/L (ref 101–111)
Creatinine, Ser: 1.21 mg/dL — ABNORMAL HIGH (ref 0.44–1.00)
GFR calc Af Amer: 60 mL/min — ABNORMAL LOW (ref >60)
GFR calc non Af Amer: 52 mL/min — ABNORMAL LOW (ref >60)
Glucose, Bld: 345 mg/dL — ABNORMAL HIGH (ref 65–99)
Potassium: 4 mmol/L (ref 3.5–5.1)
Sodium: 136 mmol/L (ref 135–145)
Total Bilirubin: 0.6 mg/dL (ref 0.3–1.2)
Total Protein: 7.6 g/dL (ref 6.5–8.1)

## 2015-11-26 LAB — URINALYSIS, ROUTINE W REFLEX MICROSCOPIC
Bilirubin Urine: NEGATIVE
Glucose, UA: >1000 mg/dL — AB
Hgb urine dipstick: NEGATIVE
Ketones, ur: NEGATIVE mg/dL
Leukocytes, UA: NEGATIVE
Nitrite: NEGATIVE
Protein, ur: NEGATIVE mg/dL
Specific Gravity, Urine: 1.022 (ref 1.005–1.030)
pH: 5.5 (ref 5.0–8.0)

## 2015-11-26 LAB — CBG MONITORING, ED
Glucose-Capillary: 353 mg/dL — ABNORMAL HIGH (ref 65–99)
Glucose-Capillary: 133 mg/dL — ABNORMAL HIGH (ref 65–99)

## 2015-11-26 LAB — URINE MICROSCOPIC-ADD ON: Bacteria, UA: NONE SEEN

## 2015-11-26 LAB — LIPASE, BLOOD: Lipase: 36 U/L (ref 11–51)

## 2015-11-26 LAB — POCT I-STAT 4, (NA,K, GLUC, HGB,HCT)
Glucose, Bld: 356 mg/dL — ABNORMAL HIGH (ref 65–99)
HCT: 40 % (ref 36.0–46.0)
Hemoglobin: 13.6 g/dL (ref 12.0–15.0)
Potassium: 4.2 mmol/L (ref 3.5–5.1)
Sodium: 137 mmol/L (ref 135–145)

## 2015-11-26 SURGERY — CO2 LASER APPLICATION
Anesthesia: Monitor Anesthesia Care

## 2015-11-26 MED ORDER — ONDANSETRON HCL 4 MG/2ML IJ SOLN
INTRAMUSCULAR | Status: AC
  Filled 2015-11-26: qty 2

## 2015-11-26 MED ORDER — ACETIC ACID 5 % SOLN
Status: AC
  Filled 2015-11-26: qty 500

## 2015-11-26 MED ORDER — "INSULIN SYRINGE 31G X 5/16"" 1 ML MISC": 1.0000 [IU] | Freq: Two times a day (BID) | Status: AC

## 2015-11-26 MED ORDER — PROPOFOL 500 MG/50ML IV EMUL
INTRAVENOUS | Status: AC
  Filled 2015-11-26: qty 50

## 2015-11-26 MED ORDER — MIDAZOLAM HCL 2 MG/2ML IJ SOLN
INTRAMUSCULAR | Status: AC
  Filled 2015-11-26: qty 2

## 2015-11-26 MED ORDER — KETOROLAC TROMETHAMINE 30 MG/ML IJ SOLN
INTRAMUSCULAR | Status: AC
  Filled 2015-11-26: qty 1

## 2015-11-26 MED ORDER — INSULIN LISPRO PROT & LISPRO (75-25 MIX) 100 UNIT/ML KWIKPEN: 44.0000 [IU] | PEN_INJECTOR | Freq: Once | SUBCUTANEOUS | Status: DC

## 2015-11-26 MED ORDER — SILVER SULFADIAZINE 1 % EX CREA
TOPICAL_CREAM | CUTANEOUS | Status: AC
  Filled 2015-11-26: qty 50

## 2015-11-26 MED ORDER — FERRIC SUBSULFATE 259 MG/GM EX SOLN
CUTANEOUS | Status: AC
  Filled 2015-11-26: qty 8

## 2015-11-26 MED ORDER — SODIUM CHLORIDE 0.9 % IV BOLUS (SEPSIS)
1000.0000 mL | Freq: Once | INTRAVENOUS | Status: AC
  Administered 2015-11-26: 1000 mL via INTRAVENOUS

## 2015-11-26 MED ORDER — LACTATED RINGERS IV SOLN
INTRAVENOUS | Status: DC
  Administered 2015-11-26: 08:00:00 via INTRAVENOUS
  Filled 2015-11-26: qty 1000

## 2015-11-26 MED ORDER — DEXAMETHASONE SODIUM PHOSPHATE 10 MG/ML IJ SOLN
INTRAMUSCULAR | Status: AC
  Filled 2015-11-26: qty 1

## 2015-11-26 MED ORDER — INSULIN ASPART PROT & ASPART (70-30 MIX) 100 UNIT/ML ~~LOC~~ SUSP
44.0000 [IU] | Freq: Once | SUBCUTANEOUS | Status: AC
  Administered 2015-11-26: 44 [IU] via SUBCUTANEOUS
  Filled 2015-11-26: qty 10

## 2015-11-26 MED ORDER — FENTANYL CITRATE (PF) 100 MCG/2ML IJ SOLN
INTRAMUSCULAR | Status: AC
  Filled 2015-11-26: qty 2

## 2015-11-26 MED ORDER — LIDOCAINE HCL (CARDIAC) 20 MG/ML IV SOLN
INTRAVENOUS | Status: AC
  Filled 2015-11-26: qty 5

## 2015-11-26 SURGICAL SUPPLY — 45 items
APPLICATOR COTTON TIP 6IN STRL (MISCELLANEOUS) ×4 IMPLANT
BAG URINE DRAINAGE (UROLOGICAL SUPPLIES) IMPLANT
BAG URINE LEG 19OZ MD ST LTX (BAG) IMPLANT
BLADE SURG 15 STRL LF DISP TIS (BLADE) IMPLANT
BLADE SURG 15 STRL SS (BLADE)
CANISTER SUCTION 1200CC (MISCELLANEOUS) IMPLANT
CANISTER SUCTION 2500CC (MISCELLANEOUS) IMPLANT
CATH FOLEY 2WAY SLVR  5CC 16FR (CATHETERS)
CATH FOLEY 2WAY SLVR 5CC 16FR (CATHETERS) IMPLANT
CATH ROBINSON RED A/P 16FR (CATHETERS) IMPLANT
CLOTH BEACON ORANGE TIMEOUT ST (SAFETY) ×2 IMPLANT
COVER BACK TABLE 60X90IN (DRAPES) ×2 IMPLANT
DEPRESSOR TONGUE BLADE STERILE (MISCELLANEOUS) ×2 IMPLANT
DRAPE LG THREE QUARTER DISP (DRAPES) IMPLANT
DRAPE UNDERBUTTOCKS STRL (DRAPE) ×2 IMPLANT
DRSG TELFA 3X8 NADH (GAUZE/BANDAGES/DRESSINGS) IMPLANT
ELECT BALL LEEP 3MM BLK (ELECTRODE) IMPLANT
ELECT REM PT RETURN 9FT ADLT (ELECTROSURGICAL) ×2
ELECTRODE REM PT RTRN 9FT ADLT (ELECTROSURGICAL) ×1 IMPLANT
GLOVE BIO SURGEON STRL SZ7.5 (GLOVE) ×4 IMPLANT
GOWN STRL REUS W/ TWL LRG LVL3 (GOWN DISPOSABLE) ×2 IMPLANT
GOWN STRL REUS W/TWL LRG LVL3 (GOWN DISPOSABLE) ×2
KIT ROOM TURNOVER WOR (KITS) ×2 IMPLANT
LEGGING LITHOTOMY PAIR STRL (DRAPES) IMPLANT
MANIFOLD NEPTUNE II (INSTRUMENTS) IMPLANT
NEEDLE HYPO 25X1 1.5 SAFETY (NEEDLE) IMPLANT
NS IRRIG 500ML POUR BTL (IV SOLUTION) IMPLANT
PACK BASIN DAY SURGERY FS (CUSTOM PROCEDURE TRAY) ×2 IMPLANT
PAD OB MATERNITY 4.3X12.25 (PERSONAL CARE ITEMS) ×2 IMPLANT
PAD PREP 24X48 CUFFED NSTRL (MISCELLANEOUS) ×2 IMPLANT
PENCIL BUTTON HOLSTER BLD 10FT (ELECTRODE) IMPLANT
SCOPETTES 8  STERILE (MISCELLANEOUS) ×2
SCOPETTES 8 STERILE (MISCELLANEOUS) ×2 IMPLANT
SUT VIC AB 2-0 SH 27 (SUTURE)
SUT VIC AB 2-0 SH 27XBRD (SUTURE) IMPLANT
SUT VIC AB 3-0 PS2 18 (SUTURE)
SUT VIC AB 3-0 PS2 18XBRD (SUTURE) IMPLANT
SYR CONTROL 10ML LL (SYRINGE) IMPLANT
TOWEL OR 17X24 6PK STRL BLUE (TOWEL DISPOSABLE) ×4 IMPLANT
TRAY DSU PREP LF (CUSTOM PROCEDURE TRAY) ×2 IMPLANT
TUBE CONNECTING 12X1/4 (SUCTIONS) ×2 IMPLANT
VACUUM HOSE 7/8X10 W/ WAND (MISCELLANEOUS) IMPLANT
VACUUM HOSE/TUBING 7/8INX6FT (MISCELLANEOUS) ×2 IMPLANT
WATER STERILE IRR 500ML POUR (IV SOLUTION) ×2 IMPLANT
YANKAUER SUCT BULB TIP NO VENT (SUCTIONS) ×2 IMPLANT

## 2015-11-26 NOTE — ED Provider Notes (Signed)
CSN: [id number]     Arrival date & time 11/26/15  [id number] History   First MD Initiated Contact with Patient 11/26/15 0802     Chief Complaint  Patient presents with  . Hyperglycemia     (Consider location/radiation/quality/duration/timing/severity/associated sxs/prior Treatment) HPI 49 year old female who presents with hyperglycemia. She has a history of HIV with a CD4 count of 440 in 11/24/2015, type 2 diabetes on insulin, stage II chronic kidney disease, and blindness out of her left eye. States that she was getting a preop evaluation for a pelvic procedure today, when she had an elevated point-of-care glucose. Was sent to the ED for evaluation. States that she has had long-standing hyperglycemia that has been difficult to control through her primary care physician. States that her brand of insulin was recently changed from Humalog to NovoLog one month ago into a higher dosage. States that she had forgotten to give herself insulin for a full day yesterday. States that she has had increased urination and increased thirst. Denies any chest pain, difficulty breathing, nausea or vomiting, diarrhea, severe abdominal pain, dysuria, fevers or chills, vision or speech changes, numbness or weakness.  Past Medical History  Diagnosis Date  . Hypertension   . HIV (human immunodeficiency virus infection) ([place])     [place]-- DR Carlyle Basques  . Type 2 diabetes mellitus ([place])   . VIN III (vulvar intraepithelial neoplasia III)     and Verrucoid lesion on the mons  . CKD (chronic kidney disease), stage II   . Congenital cardiomegaly     per pt has always been told since childhood and siblings   . Intermittent palpitations     mild-- no meds  . GERD (gastroesophageal reflux disease)   . Arthritis     knees  . Wears glasses   . Legally blind in left eye, as defined in [place]     trauma as child  . Uterine fibroid   . Abnormal uterine bleeding (AUB)   . History of  genital warts    Past Surgical History  Procedure Laterality Date  . Corneal transplant Left 2006    failed  . Cesarean section  2001  &  1984  . D & c hysteroscopy /  resection fibroid  2004  . Knee arthroscopy w/ acl reconstruction Bilateral 2008   Family History  Problem Relation Age of Onset  . Hypertension Mother   . Diabetes Father   . Cancer Brother   . Breast cancer Cousin    Social History  Substance Use Topics  . Smoking status: Never Smoker   . Smokeless tobacco: Never Used  . Alcohol Use: No   OB History    No data available     Review of Systems 10/14 systems reviewed and are negative other than those stated in the HPI    Allergies  Shellfish allergy and Bactrim  Home Medications   Prior to Admission medications   Medication Sig Start Date End Date Taking? Authorizing Provider  atorvastatin (LIPITOR) 10 MG tablet Take 10 mg by mouth every morning.  10/12/15  Yes Historical Provider, MD  calcium carbonate (TUMS - DOSED IN MG ELEMENTAL CALCIUM) 500 MG chewable tablet Chew 1 tablet by mouth as needed for indigestion or heartburn.   Yes Historical Provider, MD  CALCIUM PO Take 2 tablets by mouth daily.   Yes Historical Provider, MD  Cats Claw, Uncaria tomentosa, 500 MG CAPS Take 2 capsules by mouth every morning.  Yes Historical Provider, MD  cloNIDine (CATAPRES) 0.3 MG tablet Take 0.3 mg by mouth 2 (two) times daily.  10/06/15  Yes Historical Provider, MD  Coconut Oil 1000 MG CAPS Take 2 capsules by mouth daily.   Yes Historical Provider, MD  elvitegravir-cobicistat-emtricitabine-tenofovir (GENVOYA) 150-150-200-10 MG TABS tablet Take 1 tablet by mouth daily with breakfast. 10/27/15  Yes Carlyle Basques, MD  HUMALOG MIX 75/25 KWIKPEN (75-25) 100 UNIT/ML Kwikpen Inject 45 Units into the skin 2 (two) times daily.  10/06/15  Yes Historical Provider, MD  hydrochlorothiazide (HYDRODIURIL) 25 MG tablet Take 1 tablet (25 mg total) by mouth daily. 01/02/15  Yes Robyn M  Hess, PA-C  lisinopril (PRINIVIL,ZESTRIL) 10 MG tablet Take 1 tablet (10 mg total) by mouth daily. 01/02/15  Yes Robyn M Hess, PA-C  metFORMIN (GLUCOPHAGE) 1000 MG tablet Take 1,000 mg by mouth 2 (two) times daily with a meal.  09/09/15  Yes Historical Provider, MD  Insulin Syringe-Needle U-100 (INSULIN SYRINGE 1CC/31GX5/16") 31G X 5/16" 1 ML MISC 1 Units by Does not apply route 2 (two) times daily. 11/26/15   Forde Dandy, MD   BP 111/75 mmHg  Pulse 69  Temp(Src) 97.7 F (36.5 C) (Oral)  Resp 16  SpO2 100%  LMP 07/07/2015 (Exact Date) Physical Exam Physical Exam  Nursing note and vitals reviewed. Constitutional: Well developed, well nourished, non-toxic, and in no acute distress Head: Normocephalic and atraumatic.  Mouth/Throat: Oropharynx is clear and moist.  Neck: Normal range of motion. Neck supple.  Cardiovascular: Normal rate and regular rhythm.   Pulmonary/Chest: Effort normal and breath sounds normal.  Abdominal: Soft. There is no tenderness. There is no rebound and no guarding.  Musculoskeletal: Normal range of motion.  Neurological: Alert, no facial droop, fluent speech, moves all extremities symmetrically Skin: Skin is warm and dry.  Psychiatric: Cooperative  ED Course  Procedures (including critical care time) Labs Review Labs Reviewed  COMPREHENSIVE METABOLIC PANEL - Abnormal; Notable for the following:    Glucose, Bld 345 (*)    BUN 21 (*)    Creatinine, Ser 1.21 (*)    GFR calc non Af Amer 52 (*)    GFR calc Af Amer 60 (*)    All other components within normal limits  URINALYSIS, ROUTINE W REFLEX MICROSCOPIC (NOT AT [facility]) - Abnormal; Notable for the following:    Glucose, UA >1000 (*)    All other components within normal limits  URINE MICROSCOPIC-ADD ON - Abnormal; Notable for the following:    Squamous Epithelial / LPF 6-30 (*)    Casts HYALINE CASTS (*)    All other components within normal limits  CBG MONITORING, ED - Abnormal; Notable for the following:     Glucose-Capillary 353 (*)    All other components within normal limits  CBG MONITORING, ED - Abnormal; Notable for the following:    Glucose-Capillary 133 (*)    All other components within normal limits  CBC WITH DIFFERENTIAL/PLATELET  LIPASE, BLOOD    I have personally reviewed and evaluated these images and lab results as part of my medical decision-making.    MDM   Final diagnoses:  Hyperglycemia  Type 2 diabetes mellitus with stage 2 chronic kidney disease, with long-term current use of insulin ([place])    49 year old female with history of type 2 diabetes and CAD who presents with hyperglycemia. He is well-appearing and in no acute distress. Vital signs are non-concerning. Minimally dry on exam, but the remainder of exam is unremarkable. She has  point-of-care glucose in the 300s, without anion gap or evidence of metabolic acidosis. No other major electrolyte, metabolic derangements. No evidence of infection. Given IV fluids, and repeat point-of-care glucose is 133. She also received her home dose of long-acting insulin prior to this. It is felt appropriate for discharge home. Strict return and follow-up instructions are reviewed. Discussed the importance of medication compliance. Given a refill on her insulin syringes. She expressed understanding of discharge instructions felt comfortable to plan of care.    Forde Dandy, MD 11/26/15 [phone]

## 2015-11-26 NOTE — ED Notes (Signed)
Bed: WLPT2 Expected date:  Expected time:  Means of arrival:  Comments: Surgery center, hyperglycemia

## 2015-11-26 NOTE — Discharge Instructions (Signed)
Please take your insulin and diabetes medications as prescribed. Follow-up with your primary care doctor closely. Return without fail for worsening symptoms including vomiting and unable to keep down food or fluids, confusion, severe abdominal pain, chest pain, or any other symptoms concerning to you. We did not find any complications of your elevated blood sugar today.  Hyperglycemia High blood sugar (hyperglycemia) means that the level of sugar in your blood is higher than it should be. Signs of high blood sugar include:  Feeling thirsty.  Frequent peeing (urinating).  Feeling tired or sleepy.  Dry mouth.  Vision changes.  Feeling weak.  Feeling hungry but losing weight.  Numbness and tingling in your hands or feet.  Headache. When you ignore these signs, your blood sugar may keep going up. These problems may get worse, and other problems may begin. HOME CARE  Check your blood sugars as told by your doctor. Write down the numbers with the date and time.  Take the right amount of insulin or diabetes pills at the right time. Write down the dose with date and time.  Refill your insulin or diabetes pills before running out.  Watch what you eat. Follow your meal plan.  Drink liquids without sugar, such as water. Check with your doctor if you have kidney or heart disease.  Follow your doctor's orders for exercise. Exercise at the same time of day.  Keep your doctor's appointments. GET HELP RIGHT AWAY IF:   You have trouble thinking or are confused.  You have fast breathing with fruity smelling breath.  You pass out (faint).  You have 2 to 3 days of high blood sugars and you do not know why.  You have chest pain.  You are feeling sick to your stomach (nauseous) or throwing up (vomiting).  You have sudden vision changes. MAKE SURE YOU:   Understand these instructions.  Will watch your condition.  Will get help right away if you are not doing well or get worse.     This information is not intended to replace advice given to you by your health care provider. Make sure you discuss any questions you have with your health care provider.   Document Released: 10/01/2009 Document Revised: 12/25/2014 Document Reviewed: 08/10/2015 Elsevier Interactive Patient Education [employer].

## 2015-11-26 NOTE — ED Notes (Signed)
Pt hx HIV, DM, blind in left eye. Pt was scheduled to have laser eye surgery, left eye, but CBG was elevated at 350. Was sent to ED. CBG in ED 353. Denies pain.

## 2015-11-29 ENCOUNTER — Encounter (HOSPITAL_BASED_OUTPATIENT_CLINIC_OR_DEPARTMENT_OTHER): Payer: Self-pay | Admitting: Gynecology

## 2015-11-30 ENCOUNTER — Telehealth: Payer: Self-pay | Admitting: *Deleted

## 2015-11-30 NOTE — Telephone Encounter (Signed)
Called pt regarding increased blood sugars, pt will need medical clearance prior to rescheduling her procedure. Pt checking BS 2x day. Pt currently taking metformin and insulin. Pt advised her PCP is Dr. Lysle Rubens. Katrina Wolfe [id number] Dec 30 pt will see MD.  Hulen Skains Dr Lysle Rubens office request made for medical clearance on pt prior to procedure. Faxed request to [phone]

## 2015-12-08 ENCOUNTER — Ambulatory Visit (INDEPENDENT_AMBULATORY_CARE_PROVIDER_SITE_OTHER): Payer: Medicare Other | Admitting: Internal Medicine

## 2015-12-08 ENCOUNTER — Encounter: Payer: Self-pay | Admitting: Internal Medicine

## 2015-12-08 VITALS — BP 168/97 | HR 78 | Temp 98.8°F | Wt 247.0 lb

## 2015-12-08 DIAGNOSIS — E1122 Type 2 diabetes mellitus with diabetic chronic kidney disease: Secondary | ICD-10-CM | POA: Diagnosis not present

## 2015-12-08 DIAGNOSIS — I10 Essential (primary) hypertension: Secondary | ICD-10-CM

## 2015-12-08 DIAGNOSIS — B20 Human immunodeficiency virus [HIV] disease: Secondary | ICD-10-CM | POA: Diagnosis not present

## 2015-12-08 LAB — BASIC METABOLIC PANEL WITH GFR
BUN: 13 mg/dL (ref 7–25)
CO2: 29 mmol/L (ref 20–31)
Calcium: 10.3 mg/dL — ABNORMAL HIGH (ref 8.6–10.2)
Chloride: 104 mmol/L (ref 98–110)
Creat: 0.84 mg/dL (ref 0.50–1.10)
GFR, Est African American: >89 mL/min (ref >=60)
GFR, Est Non African American: 82 mL/min (ref >=60)
Glucose, Bld: 61 mg/dL — ABNORMAL LOW (ref 65–99)
Potassium: 4.9 mmol/L (ref 3.5–5.3)
Sodium: 142 mmol/L (ref 135–146)

## 2015-12-08 NOTE — Progress Notes (Signed)
Patient ID: Katrina Wolfe, female   DOB: 03-Feb-1966, 49 y.o.   MRN: [id number]       Patient ID: Katrina Wolfe, female   DOB: 05-23-1966, 49 y.o.   MRN: [id number]  HPI 49yo F with hiv disease ,CD 4 count of 440/VL 71 on genvoya. She is doing well from tolerating switch from [place]. She has noticed slightly loose stools but not increased in frequency. No diarrhea. She was to have surgical wart removal but postponed due to elevated BS. She had ran out of insulin needles. Now back on track with insulin plus metformin. Her am BS 101 today. She dr. Lorenda Hatchet on 12/30 for evaluation.  Outpatient Encounter Prescriptions as of 12/08/2015  Medication Sig  . atorvastatin (LIPITOR) 10 MG tablet Take 10 mg by mouth every morning.   . calcium carbonate (TUMS - DOSED IN MG ELEMENTAL CALCIUM) 500 MG chewable tablet Chew 1 tablet by mouth as needed for indigestion or heartburn.  Marland Kitchen CALCIUM PO Take 2 tablets by mouth daily.  . Cats Claw, Uncaria tomentosa, 500 MG CAPS Take 2 capsules by mouth every morning.   . cloNIDine (CATAPRES) 0.3 MG tablet Take 0.3 mg by mouth 2 (two) times daily.   . Coconut Oil 1000 MG CAPS Take 2 capsules by mouth daily.  Marland Kitchen elvitegravir-cobicistat-emtricitabine-tenofovir (GENVOYA) 150-150-200-10 MG TABS tablet Take 1 tablet by mouth daily with breakfast.  . HUMALOG MIX 75/25 KWIKPEN (75-25) 100 UNIT/ML Kwikpen Inject 45 Units into the skin 2 (two) times daily.   . hydrochlorothiazide (HYDRODIURIL) 25 MG tablet Take 1 tablet (25 mg total) by mouth daily.  . Insulin Syringe-Needle U-100 (INSULIN SYRINGE 1CC/31GX5/16") 31G X 5/16" 1 ML MISC 1 Units by Does not apply route 2 (two) times daily.  Marland Kitchen lisinopril (PRINIVIL,ZESTRIL) 10 MG tablet Take 1 tablet (10 mg total) by mouth daily.  . metFORMIN (GLUCOPHAGE) 1000 MG tablet Take 1,000 mg by mouth 2 (two) times daily with a meal.    No facility-administered encounter medications on file as of 12/08/2015.     Patient Active Problem List   Diagnosis Date Noted  . HIV disease ([place]) 10/27/2015  . DM type 2 (diabetes mellitus, type 2) ([place]) 10/27/2015  . Hyperlipidemia 10/27/2015  . Essential hypertension 10/27/2015  . Severe vulvar dysplasia, histologically confirmed 10/15/2015     Health Maintenance Due  Topic Date Due  . HEMOGLOBIN A1C  02/18/1966  . FOOT EXAM  08/16/1976  . OPHTHALMOLOGY EXAM  08/16/1976  . TETANUS/TDAP  08/16/1985     Review of Systems Positive pertinents listed in hpi Physical Exam   BP 168/97 mmHg  Pulse 78  Temp(Src) 98.8 F (37.1 C) (Oral)  Wt 247 lb (112.038 kg)  LMP 07/07/2015 (Exact Date) Physical Exam  Constitutional:  oriented to person, place, and time. appears well-developed and well-nourished. No distress.  HENT: [place]/AT,  no scleral icterus, left eye lens defect/loss of vision Mouth/Throat: Oropharynx is clear and moist. No oropharyngeal exudate.  Cardiovascular: Normal rate, regular rhythm and normal heart sounds. Exam reveals no gallop and no friction rub.  No murmur heard.  Pulmonary/Chest: Effort normal and breath sounds normal. No respiratory distress.  has no wheezes.  Neck = supple, no nuchal rigidity Abdominal: Soft. Bowel sounds are normal.  exhibits no distension. There is no tenderness.  Lymphadenopathy: no cervical adenopathy. No axillary adenopathy Neurological: alert and oriented to person, place, and time.  Skin: Skin is warm and dry. No rash noted. No erythema.  Psychiatric: a normal mood and affect.  behavior is normal.   Lab Results  Component Value Date   CD4TCELL 15* 11/24/2015   Lab Results  Component Value Date   CD4TABS 440 11/24/2015   CD4TABS 500 10/13/2015   Lab Results  Component Value Date   HIV1RNAQUANT 71* 11/24/2015   Lab Results  Component Value Date   HEPBSAB NEG 10/13/2015   No results found for: RPR  CBC Lab Results  Component Value Date   WBC 9.2 11/26/2015   RBC 4.07 11/26/2015   HGB 13.0 11/26/2015   HCT 38.5 11/26/2015    PLT 285 11/26/2015   MCV 94.6 11/26/2015   MCH 31.9 11/26/2015   MCHC 33.8 11/26/2015   RDW 13.1 11/26/2015   LYMPHSABS 2.3 11/26/2015   MONOABS 0.7 11/26/2015   EOSABS 0.1 11/26/2015   BASOSABS 0.0 11/26/2015   BMET Lab Results  Component Value Date   NA 136 11/26/2015   K 4.0 11/26/2015   CL 104 11/26/2015   CO2 22 11/26/2015   GLUCOSE 345* 11/26/2015   BUN 21* 11/26/2015   CREATININE 1.21* 11/26/2015   CALCIUM 9.3 11/26/2015   GFRNONAA 52* 11/26/2015   GFRAA 60* 11/26/2015     Assessment and Plan HIV disease = appears to tolerate switch. Will continue on genvoya. Will ask her to stop "cats claw" medication for possible drug interaction  Poorly controlled DM = appears to be restarting better regimen. Followed by Dr. Lorenda Hatchet  HTN = poorly controlled but also on several agents. To see dr. Lorenda Hatchet fon 12/30 for eval  Health maintenance = quantiferon  ckd 2=  Will recheck bmp

## 2015-12-10 LAB — QUANTIFERON TB GOLD ASSAY (BLOOD)
Interferon Gamma Release Assay: NEGATIVE
Mitogen value: >10 IU/mL
Quantiferon Nil Value: 0.11 IU/mL
Quantiferon Tb Ag Minus Nil Value: 0.13 IU/mL
TB Ag value: 0.24 IU/mL

## 2015-12-17 ENCOUNTER — Telehealth: Payer: Self-pay | Admitting: *Deleted

## 2015-12-17 NOTE — Telephone Encounter (Signed)
Received a call from Dr. Glenna Durand Office stating that Dr. Lysle Rubens was unable to give surgical clearance for Katrina Wolfe due to her blood sugars.The office will fax a letter to [facility] stating that they are unable to give clearance

## 2015-12-31 ENCOUNTER — Encounter (HOSPITAL_BASED_OUTPATIENT_CLINIC_OR_DEPARTMENT_OTHER): Payer: Self-pay | Admitting: *Deleted

## 2016-01-03 ENCOUNTER — Encounter (HOSPITAL_BASED_OUTPATIENT_CLINIC_OR_DEPARTMENT_OTHER): Payer: Self-pay | Admitting: *Deleted

## 2016-01-03 ENCOUNTER — Encounter: Payer: Self-pay | Admitting: Gynecologic Oncology

## 2016-01-03 NOTE — Progress Notes (Signed)
12/17/15: Received a phone call later today from Dr. Glenna Durand office stating the patient had medical clearance to proceed with the procedure.  Note faxed to the office.  01/03/16: Office note faxed to [facility] at [facility] with medical clearance.

## 2016-01-03 NOTE — Progress Notes (Addendum)
NPO AFTER MN.  ARRIVE AT 0715.  NEEDS ISTAT 8.  CURRENT EKG IN CHART AND EPIC.  WILL TAKE CLONIDINE AND LIPITOR AM DOS W/ SIPS OF WATER.  PT HAS MEDICAL TRANSPORT TO AND FROM FACILITY, CONTACT [phone]. HUSBAND WILL BE CAREGIVER AT HOME.

## 2016-01-06 NOTE — Anesthesia Preprocedure Evaluation (Addendum)
Anesthesia Evaluation  Patient identified by MRN, date of birth, ID band Patient awake    Reviewed: Allergy & Precautions, NPO status , Patient's Chart, lab work & pertinent test results  History of Anesthesia Complications Negative for: history of anesthetic complications  Airway Mallampati: II   Neck ROM: Full    Dental  (+) Teeth Intact, Dental Advisory Given   Pulmonary neg pulmonary ROS,    Pulmonary exam normal        Cardiovascular hypertension, Pt. on medications Normal cardiovascular exam     Neuro/Psych negative neurological ROS  negative psych ROS   GI/Hepatic GERD  Medicated,  Endo/Other  diabetesMorbid obesity  Renal/GU      Musculoskeletal   Abdominal   Peds  Hematology  (+) HIV,   Anesthesia Other Findings   Reproductive/Obstetrics                            Anesthesia Physical Anesthesia Plan  ASA: III  Anesthesia Plan: General   Post-op Pain Management:    Induction: Intravenous  Airway Management Planned: LMA and Oral ETT  Additional Equipment:   Intra-op Plan:   Post-operative Plan: Extubation in OR  Informed Consent: I have reviewed the patients History and Physical, chart, labs and discussed the procedure including the risks, benefits and alternatives for the proposed anesthesia with the patient or authorized representative who has indicated his/her understanding and acceptance.   Dental advisory given  Plan Discussed with: CRNA, Anesthesiologist and Surgeon  Anesthesia Plan Comments:        Anesthesia Quick Evaluation

## 2016-01-07 ENCOUNTER — Encounter (HOSPITAL_BASED_OUTPATIENT_CLINIC_OR_DEPARTMENT_OTHER): Payer: Self-pay | Admitting: Certified Registered"

## 2016-01-07 ENCOUNTER — Ambulatory Visit (HOSPITAL_BASED_OUTPATIENT_CLINIC_OR_DEPARTMENT_OTHER): Payer: Medicare Other | Admitting: Anesthesiology

## 2016-01-07 ENCOUNTER — Ambulatory Visit (HOSPITAL_BASED_OUTPATIENT_CLINIC_OR_DEPARTMENT_OTHER)
Admission: RE | Admit: 2016-01-07 | Discharge: 2016-01-07 | Disposition: A | Discharge status: Home / Self Care | Payer: Medicare Other | Source: Ambulatory Visit | Attending: Gynecology | Admitting: Gynecology

## 2016-01-07 ENCOUNTER — Encounter (HOSPITAL_BASED_OUTPATIENT_CLINIC_OR_DEPARTMENT_OTHER)
Admission: RE | Disposition: A | Discharge status: Home / Self Care | Payer: Self-pay | Source: Ambulatory Visit | Attending: Gynecology

## 2016-01-07 ENCOUNTER — Other Ambulatory Visit: Payer: Self-pay | Admitting: Gynecologic Oncology

## 2016-01-07 DIAGNOSIS — Z6836 Body mass index (BMI) 36.0-36.9, adult: Secondary | ICD-10-CM | POA: Insufficient documentation

## 2016-01-07 DIAGNOSIS — D071 Carcinoma in situ of vulva: Secondary | ICD-10-CM

## 2016-01-07 DIAGNOSIS — I10 Essential (primary) hypertension: Secondary | ICD-10-CM | POA: Diagnosis not present

## 2016-01-07 DIAGNOSIS — G8918 Other acute postprocedural pain: Secondary | ICD-10-CM

## 2016-01-07 DIAGNOSIS — Z794 Long term (current) use of insulin: Secondary | ICD-10-CM | POA: Insufficient documentation

## 2016-01-07 DIAGNOSIS — K219 Gastro-esophageal reflux disease without esophagitis: Secondary | ICD-10-CM | POA: Diagnosis not present

## 2016-01-07 DIAGNOSIS — Z79899 Other long term (current) drug therapy: Secondary | ICD-10-CM | POA: Diagnosis not present

## 2016-01-07 DIAGNOSIS — E119 Type 2 diabetes mellitus without complications: Secondary | ICD-10-CM | POA: Diagnosis not present

## 2016-01-07 DIAGNOSIS — N909 Noninflammatory disorder of vulva and perineum, unspecified: Secondary | ICD-10-CM | POA: Diagnosis present

## 2016-01-07 HISTORY — PX: VULVECTOMY: SHX1086

## 2016-01-07 HISTORY — PX: CO2 LASER APPLICATION: SHX5778

## 2016-01-07 LAB — POCT I-STAT, CHEM 8
BUN: 20 mg/dL (ref 6–20)
Calcium, Ion: 1.29 mmol/L — ABNORMAL HIGH (ref 1.12–1.23)
Chloride: 104 mmol/L (ref 101–111)
Creatinine, Ser: 1 mg/dL (ref 0.44–1.00)
Glucose, Bld: 143 mg/dL — ABNORMAL HIGH (ref 65–99)
HCT: 43 % (ref 36.0–46.0)
Hemoglobin: 14.6 g/dL (ref 12.0–15.0)
Potassium: 4.6 mmol/L (ref 3.5–5.1)
Sodium: 141 mmol/L (ref 135–145)
TCO2: 26 mmol/L (ref 0–100)

## 2016-01-07 LAB — GLUCOSE, CAPILLARY: Glucose-Capillary: 131 mg/dL — ABNORMAL HIGH (ref 65–99)

## 2016-01-07 SURGERY — CO2 LASER APPLICATION
Anesthesia: General | Site: Vulva

## 2016-01-07 MED ORDER — MIDAZOLAM HCL 5 MG/5ML IJ SOLN
INTRAMUSCULAR | Status: DC | PRN
  Administered 2016-01-07: 2 mg via INTRAVENOUS

## 2016-01-07 MED ORDER — STERILE WATER FOR IRRIGATION IR SOLN
Status: DC | PRN
  Administered 2016-01-07: 1000 mL

## 2016-01-07 MED ORDER — ACETIC ACID 5 % SOLN
Status: DC | PRN
  Administered 2016-01-07: 1 via TOPICAL

## 2016-01-07 MED ORDER — PROPOFOL 10 MG/ML IV BOLUS
INTRAVENOUS | Status: DC | PRN
  Administered 2016-01-07: 180 mg via INTRAVENOUS

## 2016-01-07 MED ORDER — ACETAMINOPHEN 650 MG RE SUPP
650.0000 mg | RECTAL | Status: DC | PRN
  Filled 2016-01-07: qty 1

## 2016-01-07 MED ORDER — LIDOCAINE HCL (CARDIAC) 20 MG/ML IV SOLN
INTRAVENOUS | Status: AC
  Filled 2016-01-07: qty 5

## 2016-01-07 MED ORDER — LIDOCAINE HCL (CARDIAC) 20 MG/ML IV SOLN
INTRAVENOUS | Status: DC | PRN
  Administered 2016-01-07: 80 mg via INTRAVENOUS

## 2016-01-07 MED ORDER — SODIUM CHLORIDE 0.9 % IV SOLN
250.0000 mL | INTRAVENOUS | Status: DC | PRN
  Filled 2016-01-07: qty 250

## 2016-01-07 MED ORDER — SCOPOLAMINE 1 MG/3DAYS TD PT72
MEDICATED_PATCH | TRANSDERMAL | Status: AC
  Filled 2016-01-07: qty 1

## 2016-01-07 MED ORDER — SODIUM CHLORIDE 0.9 % IJ SOLN
3.0000 mL | Freq: Two times a day (BID) | INTRAMUSCULAR | Status: DC
  Filled 2016-01-07: qty 3

## 2016-01-07 MED ORDER — FENTANYL CITRATE (PF) 100 MCG/2ML IJ SOLN
INTRAMUSCULAR | Status: AC
  Filled 2016-01-07: qty 2

## 2016-01-07 MED ORDER — MIDAZOLAM HCL 2 MG/2ML IJ SOLN
INTRAMUSCULAR | Status: AC
  Filled 2016-01-07: qty 2

## 2016-01-07 MED ORDER — SILVER SULFADIAZINE 1 % EX CREA
TOPICAL_CREAM | CUTANEOUS | Status: DC | PRN
  Administered 2016-01-07: 1 via TOPICAL

## 2016-01-07 MED ORDER — SODIUM CHLORIDE 0.9 % IJ SOLN
3.0000 mL | INTRAMUSCULAR | Status: DC | PRN
  Filled 2016-01-07: qty 3

## 2016-01-07 MED ORDER — LACTATED RINGERS IV SOLN
INTRAVENOUS | Status: DC
  Administered 2016-01-07: 08:00:00 via INTRAVENOUS
  Filled 2016-01-07: qty 1000

## 2016-01-07 MED ORDER — KETOROLAC TROMETHAMINE 15 MG/ML IJ SOLN
15.0000 mg | Freq: Four times a day (QID) | INTRAMUSCULAR | Status: DC
  Filled 2016-01-07: qty 1

## 2016-01-07 MED ORDER — OXYCODONE HCL 5 MG PO TABS
5.0000 mg | ORAL_TABLET | ORAL | Status: DC | PRN
  Filled 2016-01-07: qty 2

## 2016-01-07 MED ORDER — ACETAMINOPHEN 325 MG PO TABS
650.0000 mg | ORAL_TABLET | ORAL | Status: DC | PRN
  Filled 2016-01-07: qty 2

## 2016-01-07 MED ORDER — ONDANSETRON HCL 4 MG/2ML IJ SOLN
INTRAMUSCULAR | Status: DC | PRN
  Administered 2016-01-07: 4 mg via INTRAVENOUS

## 2016-01-07 MED ORDER — SILVER SULFADIAZINE 1 % EX CREA: 1.0000 "application " | TOPICAL_CREAM | Freq: Two times a day (BID) | CUTANEOUS | Status: DC

## 2016-01-07 MED ORDER — ONDANSETRON HCL 4 MG/2ML IJ SOLN
INTRAMUSCULAR | Status: AC
  Filled 2016-01-07: qty 2

## 2016-01-07 MED ORDER — OXYCODONE-ACETAMINOPHEN 5-325 MG PO TABS: 1.0000 | ORAL_TABLET | Freq: Four times a day (QID) | ORAL | Status: DC | PRN

## 2016-01-07 MED ORDER — FENTANYL CITRATE (PF) 100 MCG/2ML IJ SOLN
INTRAMUSCULAR | Status: DC | PRN
  Administered 2016-01-07 (×2): 25 ug via INTRAVENOUS
  Administered 2016-01-07: 50 ug via INTRAVENOUS
  Administered 2016-01-07: 25 ug via INTRAVENOUS

## 2016-01-07 MED ORDER — ACETAMINOPHEN 500 MG PO TABS
1000.0000 mg | ORAL_TABLET | Freq: Four times a day (QID) | ORAL | Status: DC
  Filled 2016-01-07: qty 2

## 2016-01-07 MED ORDER — PROPOFOL 10 MG/ML IV BOLUS
INTRAVENOUS | Status: AC
  Filled 2016-01-07: qty 20

## 2016-01-07 MED ORDER — SCOPOLAMINE 1 MG/3DAYS TD PT72
1.0000 | MEDICATED_PATCH | TRANSDERMAL | Status: DC
  Administered 2016-01-07: 1.5 mg via TRANSDERMAL
  Filled 2016-01-07: qty 1

## 2016-01-07 SURGICAL SUPPLY — 49 items
APPLICATOR COTTON TIP 6IN STRL (MISCELLANEOUS) ×6 IMPLANT
BAG URINE DRAINAGE (UROLOGICAL SUPPLIES) IMPLANT
BAG URINE LEG 19OZ MD ST LTX (BAG) IMPLANT
BLADE SURG 15 STRL LF DISP TIS (BLADE) ×2 IMPLANT
BLADE SURG 15 STRL SS (BLADE) ×1
CANISTER SUCTION 1200CC (MISCELLANEOUS) ×3 IMPLANT
CANISTER SUCTION 2500CC (MISCELLANEOUS) IMPLANT
CATH FOLEY 2WAY SLVR  5CC 16FR (CATHETERS)
CATH FOLEY 2WAY SLVR 5CC 16FR (CATHETERS) IMPLANT
CATH ROBINSON RED A/P 16FR (CATHETERS) IMPLANT
CLOTH BEACON ORANGE TIMEOUT ST (SAFETY) ×3 IMPLANT
COVER BACK TABLE 60X90IN (DRAPES) ×3 IMPLANT
DEPRESSOR TONGUE BLADE STERILE (MISCELLANEOUS) ×3 IMPLANT
DRAPE LG THREE QUARTER DISP (DRAPES) IMPLANT
DRAPE UNDERBUTTOCKS STRL (DRAPE) ×3 IMPLANT
DRSG TELFA 3X8 NADH (GAUZE/BANDAGES/DRESSINGS) IMPLANT
ELECT BALL LEEP 3MM BLK (ELECTRODE) IMPLANT
ELECT REM PT RETURN 9FT ADLT (ELECTROSURGICAL) ×3
ELECTRODE REM PT RTRN 9FT ADLT (ELECTROSURGICAL) ×2 IMPLANT
GLOVE BIO SURGEON STRL SZ 6.5 (GLOVE) ×3 IMPLANT
GLOVE BIO SURGEON STRL SZ7.5 (GLOVE) ×6 IMPLANT
GLOVE BIOGEL PI IND STRL 6.5 (GLOVE) ×4 IMPLANT
GLOVE BIOGEL PI INDICATOR 6.5 (GLOVE) ×2
GLOVE SURG SS PI 6.5 STRL IVOR (GLOVE) ×3 IMPLANT
GOWN STRL REUS W/ TWL LRG LVL3 (GOWN DISPOSABLE) ×6 IMPLANT
GOWN STRL REUS W/TWL LRG LVL3 (GOWN DISPOSABLE) ×3
KIT ROOM TURNOVER WOR (KITS) ×3 IMPLANT
LEGGING LITHOTOMY PAIR STRL (DRAPES) ×3 IMPLANT
MANIFOLD NEPTUNE II (INSTRUMENTS) IMPLANT
NEEDLE HYPO 25X1 1.5 SAFETY (NEEDLE) ×3 IMPLANT
NS IRRIG 500ML POUR BTL (IV SOLUTION) IMPLANT
PACK BASIN DAY SURGERY FS (CUSTOM PROCEDURE TRAY) ×3 IMPLANT
PAD OB MATERNITY 4.3X12.25 (PERSONAL CARE ITEMS) ×3 IMPLANT
PAD PREP 24X48 CUFFED NSTRL (MISCELLANEOUS) ×3 IMPLANT
PENCIL BUTTON HOLSTER BLD 10FT (ELECTRODE) IMPLANT
SCOPETTES 8  STERILE (MISCELLANEOUS) ×2
SCOPETTES 8 STERILE (MISCELLANEOUS) ×4 IMPLANT
SUT VIC AB 2-0 SH 27 (SUTURE)
SUT VIC AB 2-0 SH 27XBRD (SUTURE) IMPLANT
SUT VIC AB 3-0 PS2 18 (SUTURE)
SUT VIC AB 3-0 PS2 18XBRD (SUTURE) IMPLANT
SYR CONTROL 10ML LL (SYRINGE) IMPLANT
TOWEL OR 17X24 6PK STRL BLUE (TOWEL DISPOSABLE) ×6 IMPLANT
TRAY DSU PREP LF (CUSTOM PROCEDURE TRAY) ×3 IMPLANT
TUBE CONNECTING 12X1/4 (SUCTIONS) ×3 IMPLANT
VACUUM HOSE 7/8X10 W/ WAND (MISCELLANEOUS) IMPLANT
VACUUM HOSE/TUBING 7/8INX6FT (MISCELLANEOUS) ×3 IMPLANT
WATER STERILE IRR 500ML POUR (IV SOLUTION) ×3 IMPLANT
YANKAUER SUCT BULB TIP NO VENT (SUCTIONS) ×3 IMPLANT

## 2016-01-07 NOTE — Transfer of Care (Signed)
Immediate Anesthesia Transfer of Care Note  Patient: Katrina Wolfe  Procedure(s) Performed: Procedure(s) (LRB): CO2 LASER OF VULVAR (N/A) WIDE LOCAL EXCISION (N/A)  Patient Location: PACU  Anesthesia Type: General  Level of Consciousness: awake, oriented, sedated and patient cooperative  Airway & Oxygen Therapy: Patient Spontanous Breathing and Patient connected to face mask oxygen  Post-op Assessment: Report given to PACU RN and Post -op Vital signs reviewed and stable  Post vital signs: Reviewed and stable  Complications: No apparent anesthesia complications

## 2016-01-07 NOTE — Anesthesia Postprocedure Evaluation (Signed)
Anesthesia Post Note  Patient: Katrina Wolfe  Procedure(s) Performed: Procedure(s) (LRB): CO2 LASER OF VULVAR (N/A) WIDE LOCAL EXCISION (N/A)  Patient location during evaluation: PACU Anesthesia Type: General Level of consciousness: sedated Pain management: pain level controlled Vital Signs Assessment: post-procedure vital signs reviewed and stable Respiratory status: spontaneous breathing and respiratory function stable Cardiovascular status: stable Anesthetic complications: no    Last Vitals:  Filed Vitals:   01/07/16 1000 01/07/16 1015  BP: 132/88 139/95  Pulse: 72 70  Temp:    Resp: 19 17    Last Pain:  Filed Vitals:   01/07/16 1021  PainSc: [place]

## 2016-01-07 NOTE — H&P (Signed)
Katrina Wolfe 50 y.o. female  Chief Complaint  Patient presents with  . 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  Plan: I recommend laser vaporization of the vulvar lesions and wide local excision of the lesion on the mons. We will schedule the procedure be done as an outpatient on January 07, 3472  HPI: 50 year old [place] American female seen in consultation the request of Dr.Varnado regarding management of recurrent VIN 3. Patient presented with lesions on the vulva and mons which been biopsied showing both with severe dysplasia. Patient reports that she had laser vaporization of the vulva in 2010 in [place] for similar condition. She is not on any immunosuppressive therapies. Pap smears in the past of a normal period the patient is not had a menstrual period since July 2016.  Review of Systems:10 point review of systems is negative except as noted in interval history.   Vitals: Blood pressure 153/80, pulse 18, temperature 98 F (36.7 C), temperature source Oral, resp. rate 18, height 5\' 9"  (1.753 m), weight 242 lb 4.8 oz (109.907 kg), SpO2 100 %.  Physical Exam: General : The patient is a healthy woman in no acute distress.  HEENT: normocephalic, extraoccular movements normal; neck is supple without thyromegally  Lynphnodes: Supraclavicular and inguinal nodes not enlarged  Abdomen: Soft, non-tender, no ascites, no organomegally, no masses, no hernias  Pelvic:   Mons: There is a 2 cm lesion on the left mid mons which is somewhat exophytic. EGBUS: Normal female . There are raised lesions on the left labia minora and at the introitus in the midline and over to the right. There is no involvement of the anus. Vagina: Normal, no lesions  Urethra and Bladder: Normal, non-tender  Cervix: Surgically absent  Uterus: Surgically absent  Bi-manual examination: Non-tender; no adenxal  masses or nodularity  Rectal: normal sphincter tone, no masses, no blood  Lower extremities: No edema or varicosities. Normal range of motion      Allergies  Allergen Reactions  . Bactrim [Sulfamethoxazole-Trimethoprim] Hives    Past Medical History  Diagnosis Date  . Diabetes mellitus without complication ([facility])   . Hypertension   . Enlarged heart     Past Surgical History  Procedure Laterality Date  . Eye surgery    . Corneal transplant    . Cesarean section    . Knee surgery Bilateral 01/2007    [place], [place] - per pt, she fell and had knee reconstruction    Current Outpatient Prescriptions  Medication Sig Dispense Refill  . atorvastatin (LIPITOR) 10 MG tablet     . ATRIPLA 600-200-300 MG tablet     . cloNIDine (CATAPRES) 0.3 MG tablet     . HUMALOG MIX 75/25 KWIKPEN (75-25) 100 UNIT/ML Kwikpen     . hydrochlorothiazide (HYDRODIURIL) 25 MG tablet Take 1 tablet (25 mg total) by mouth daily. 30 tablet 0  . lisinopril (PRINIVIL,ZESTRIL) 10 MG tablet Take 1 tablet (10 mg total) by mouth daily. 30 tablet 0  . metFORMIN (GLUCOPHAGE) 1000 MG tablet      No current facility-administered medications for this visit.    Social History   Social History  . Marital Status: Single    Spouse Name: N/A  . Number of Children: N/A  . Years of Education: N/A   Occupational History  . Not on file.   Social History Main Topics  . Smoking status:  Never Smoker   . Smokeless tobacco: Not on file  . Alcohol Use: No  . Drug Use: No  . Sexual Activity: Yes   Other Topics Concern  . Not on file   Social History Narrative    Family History  Problem Relation Age of Onset  . Hypertension Mother   . Diabetes Father   . Cancer Brother   . Breast cancer Cousin       Alvino Chapel, MD

## 2016-01-07 NOTE — Interval H&P Note (Signed)
History and Physical Interval Note:  01/07/2016 8:35 AM  Katrina Wolfe  has presented today for surgery, with the diagnosis of VULVAR DYSPLASIA  The various methods of treatment have been discussed with the patient and family. After consideration of risks, benefits and other options for treatment, the patient has consented to  Procedure(s): CO2 LASER OF VULVAR (N/A) WIDE LOCAL EXCISION (N/A) as a surgical intervention .  The patient's history has been reviewed, patient examined, no change in status, stable for surgery.  I have reviewed the patient's chart and labs.  Questions were answered to the patient's satisfaction.     CLARKE-PEARSON,[name]

## 2016-01-07 NOTE — Discharge Instructions (Addendum)
Call your surgeon if you experience:   1.  Fever over 101.0. 2.  Inability to urinate. 3.  Nausea and/or vomiting. 4.  Extreme swelling or bruising at the surgical site. 5.  Continued bleeding from the incision. 6.  Increased pain, redness or drainage from the incision. 7.  Problems related to your pain medication. 8. Any problems or concerns. Post Anesthesia Home Care Instructions  Activity: Get plenty of rest for the remainder of the day. A responsible adult should stay with you for 24 hours following the procedure.  For the next 24 hours, DO NOT: -Drive a car -Paediatric nurse -Drink alcoholic beverages -Take any medication unless instructed by your physician -Make any legal decisions or sign important papers.  Meals: Start with liquid foods such as gelatin or soup. Progress to regular foods as tolerated. Avoid greasy, spicy, heavy foods. If nausea and/or vomiting occur, drink only clear liquids until the nausea and/or vomiting subsides. Call your physician if vomiting continues.  Special Instructions/Symptoms: Your throat may feel dry or sore from the anesthesia or the breathing tube placed in your throat during surgery. If this causes discomfort, gargle with warm salt water. The discomfort should disappear within 24 hours.  If you had a scopolamine patch placed behind your ear for the management of post- operative nausea and/or vomiting:  1. The medication in the patch is effective for 72 hours, after which it should be removed.  Wrap patch in a tissue and discard in the trash. Wash hands thoroughly with soap and water. 2. You may remove the patch earlier than 72 hours if you experience unpleasant side effects which may include dry mouth, dizziness or visual disturbances. 3. Avoid touching the patch. Wash your hands with soap and water after contact with the patch.

## 2016-01-07 NOTE — Anesthesia Procedure Notes (Signed)
Procedure Name: LMA Insertion Date/Time: 01/07/2016 8:47 AM Performed by: Denna Haggard D Pre-anesthesia Checklist: Patient identified, Emergency Drugs available, Suction available and Patient being monitored Patient Re-evaluated:Patient Re-evaluated prior to inductionOxygen Delivery Method: Circle System Utilized Preoxygenation: Pre-oxygenation with 100% oxygen Intubation Type: IV induction Ventilation: Mask ventilation without difficulty LMA: LMA inserted LMA Size: 4.0 Number of attempts: 1 Airway Equipment and Method: Bite block Placement Confirmation: positive ETCO2 Tube secured with: Tape Dental Injury: Teeth and Oropharynx as per pre-operative assessment

## 2016-01-07 NOTE — Op Note (Signed)
Katrina Wolfe  female MEDICAL RECORD [id number] DATE OF BIRTH: [birth year] PHYSICIAN: Marti Sleigh, M.D  01/07/2016   OPERATIVE REPORT  PREOPERATIVE DIAGNOSIS: Verrucous lesion on mons. VIN 3 of vulva  POSTOPERATIVE DIAGNOSIS: Same  PROCEDURE: Wide local excision of lesion on mons. CO2 laser vaporization of vulvar and buttocks lesion.  SURGEON: Marti Sleigh, M.D ASSISTANT:  ANESTHESIA: LMA  ESTIMATED BLOOD LOSS: Minimal  SURGICAL FINDINGS: On the left side of the mons there was a 2 cm exophytic lesion which was very friable. There was an exophytic lesion on the posterior right buttocks and white epithelium consistent with VIN 3 on the perineum and extending up the left labia minora.  PROCEDURE: The patient was brought to the operating room and after satisfactory attainment of general anesthesia was placed in lithotomy position in [place]. Surgical timeout was taken. The patient was prepped and draped. An elliptical incision was created around the lesion on the mons and the lesion resected. The lesion was marked on the left side with a suture. Hemostasis achieved with cautery. The skin was reapproximated with a running subcuticular suture of 3-0 Vicryl.  Attention was turned to the vulva. A slightly exophytic lesion on the buttocks was vaporized with the CO2 laser. Similarly, after applying acetic acid, all areas of white epithelium on the perineum and left labia minora were vaporized the CO2 laser down to the second surgical plane. Hemostasis was excellent. Silvadene cream was applied. The patient was awakened from anesthesia and taken to the recovery room in satisfactory condition. Sponge needle and isthmic counts correct 2  Marti Sleigh, M.D

## 2016-01-11 ENCOUNTER — Encounter (HOSPITAL_BASED_OUTPATIENT_CLINIC_OR_DEPARTMENT_OTHER): Payer: Self-pay | Admitting: Gynecology

## 2016-01-11 ENCOUNTER — Telehealth: Payer: Self-pay | Admitting: Gynecologic Oncology

## 2016-01-11 NOTE — Telephone Encounter (Signed)
Patient informed of path results.  Stating she is doing well post-operatively.  No concerns voiced.  Follow up appointment given.  Advised to call for any questions or concerns.

## 2016-01-28 ENCOUNTER — Ambulatory Visit: Payer: Medicare Other | Attending: Gynecology | Admitting: Gynecology

## 2016-01-28 ENCOUNTER — Encounter: Payer: Self-pay | Admitting: Gynecology

## 2016-01-28 VITALS — BP 131/76 | HR 65 | Temp 98.3°F | Resp 18 | Ht 69.0 in | Wt 242.1 lb

## 2016-01-28 DIAGNOSIS — N182 Chronic kidney disease, stage 2 (mild): Secondary | ICD-10-CM | POA: Diagnosis not present

## 2016-01-28 DIAGNOSIS — D071 Carcinoma in situ of vulva: Secondary | ICD-10-CM | POA: Insufficient documentation

## 2016-01-28 DIAGNOSIS — D259 Leiomyoma of uterus, unspecified: Secondary | ICD-10-CM | POA: Diagnosis not present

## 2016-01-28 DIAGNOSIS — Z8249 Family history of ischemic heart disease and other diseases of the circulatory system: Secondary | ICD-10-CM | POA: Diagnosis not present

## 2016-01-28 DIAGNOSIS — E1122 Type 2 diabetes mellitus with diabetic chronic kidney disease: Secondary | ICD-10-CM | POA: Insufficient documentation

## 2016-01-28 DIAGNOSIS — I129 Hypertensive chronic kidney disease with stage 1 through stage 4 chronic kidney disease, or unspecified chronic kidney disease: Secondary | ICD-10-CM | POA: Insufficient documentation

## 2016-01-28 DIAGNOSIS — Q248 Other specified congenital malformations of heart: Secondary | ICD-10-CM | POA: Diagnosis not present

## 2016-01-28 DIAGNOSIS — H5442 Blindness, left eye, normal vision right eye: Secondary | ICD-10-CM | POA: Insufficient documentation

## 2016-01-28 DIAGNOSIS — Z809 Family history of malignant neoplasm, unspecified: Secondary | ICD-10-CM | POA: Diagnosis not present

## 2016-01-28 DIAGNOSIS — K219 Gastro-esophageal reflux disease without esophagitis: Secondary | ICD-10-CM | POA: Diagnosis not present

## 2016-01-28 DIAGNOSIS — E119 Type 2 diabetes mellitus without complications: Secondary | ICD-10-CM | POA: Diagnosis not present

## 2016-01-28 DIAGNOSIS — M1990 Unspecified osteoarthritis, unspecified site: Secondary | ICD-10-CM | POA: Insufficient documentation

## 2016-01-28 DIAGNOSIS — Z7984 Long term (current) use of oral hypoglycemic drugs: Secondary | ICD-10-CM | POA: Insufficient documentation

## 2016-01-28 DIAGNOSIS — Z833 Family history of diabetes mellitus: Secondary | ICD-10-CM | POA: Diagnosis not present

## 2016-01-28 NOTE — Patient Instructions (Signed)
Doing well.  Continue with tub baths several times a day and use of silvadene cream.  Plan to follow up on March 27 or sooner if needed.

## 2016-01-28 NOTE — Progress Notes (Signed)
Consult Note: Gyn-Onc   Katrina Wolfe 50 y.o. female  Chief Complaint  Patient presents with  . VIN III    Post-Op follow up    Assessment : VIN 3 of the vulva and  mons. Good postoperative recovery today.  Plan:  Patient continue sitz baths and application Silvadene cream to the lasered areas on the vulva. She return to see me on March 27 for follow-up or as needed. She's encouraged to continue to keep her diabetes under control.  Interval history: Patient underwent wide local excision of a bruit choroid lesion of her mons on 01/07/2016. She also had extensive laser vaporization of vulvar VIN 3. Final pathology of the excisional biopsy of the mons showed VIN 3 with negative margins.  Since hospital discharge she has been using sitz baths twice a day and applying Silvadene cream. She reports that the vulvar areas are not in pain and she's had no difficulty.  HPI: 50 year old African American female seen in consultation the request of Dr.Varnado regarding management of recurrent VIN 3. Patient presented with lesions on the vulva and mons which been biopsied showing both with severe dysplasia. Patient reports that she had laser vaporization of the vulva in 2010 in [place] for similar condition. She is not on any immunosuppressive therapies. Pap smears in the past of a normal period the patient is not had a menstrual period since July 2016.  She underwent wide local excision of the lesion on her mons (VIN-III) as well as laser vaporization of several lesions of the vulva on 01/07/2016.  Review of Systems:10 point review of systems is negative except as noted in interval history.   Vitals: Blood pressure 131/76, pulse 65, temperature 98.3 F (36.8 C), temperature source Oral, resp. rate 18, height 5\' 9"  (1.753 m), weight 242 lb 1.6 oz (109.816 kg), last menstrual period 07/07/2015, SpO2 100 %.  Physical Exam: General : The patient is a healthy woman in no acute distress.  HEENT:  normocephalic, extraoccular movements normal; neck is supple without thyromegally  Lynphnodes: Supraclavicular and inguinal nodes not enlarged  Abdomen: Soft, non-tender, no ascites, no organomegally, no masses, no hernias  Pelvic:   Mons: The incision on the mons is healing well.. EGBUS: Normal female .  The 3 major areas were lasered on the vulva are all healing well and about 50% epithelialized. His no evidence of erythema cellulitis or infection. Vagina: Normal, no lesions  Urethra and Bladder: Normal, non-tender  Cervix: Surgically absent  Uterus: Surgically absent  Bi-manual examination: Non-tender; no adenxal masses or nodularity  Rectal: normal sphincter tone, no masses, no blood  Lower extremities: No edema or varicosities. Normal range of motion      Allergies  Allergen Reactions  . Shellfish Allergy Anaphylaxis    All types shellfish  . Bactrim [Sulfamethoxazole-Trimethoprim] Hives    Past Medical History  Diagnosis Date  . Hypertension   . HIV (human immunodeficiency virus infection) ([place])     [place]-- DR Carlyle Basques  . Type 2 diabetes mellitus ([place])   . VIN III (vulvar intraepithelial neoplasia III)     and Verrucoid lesion on the mons  . CKD (chronic kidney disease), stage II   . Congenital cardiomegaly     per pt has always been told since childhood and siblings   . Intermittent palpitations     mild-- no meds  . GERD (gastroesophageal reflux disease)   . Arthritis     knees  . Wears  glasses   . Legally blind in left eye, as defined in [place]     trauma as child  . Uterine fibroid   . Abnormal uterine bleeding (AUB)   . History of genital warts     Past Surgical History  Procedure Laterality Date  . Corneal transplant Left 2006    failed  . Cesarean section  2001  &  1984  . D & c hysteroscopy /  resection fibroid  2004  . Knee arthroscopy w/ acl reconstruction Bilateral 2008  . Co2 laser application N/A  [id number]    Procedure: CO2 LASER APPLICATION AND WIDE VOCAL EXCSION OF LESION ON MONS PUBIS;  Surgeon: Marti Sleigh, MD;  Location: [facility];  Service: Gynecology;  Laterality: N/A;   CASE CANCELLED   . Co2 laser application N/A [id number]    Procedure: CO2 LASER OF VULVAR;  Surgeon: Marti Sleigh, MD;  Location[facility];  Service: Gynecology;  Laterality: N/A;  . Vulvectomy N/A 01/07/2016    Procedure: WIDE LOCAL EXCISION;  Surgeon: Marti Sleigh, MD;  Location: [facility];  Service: Gynecology;  Laterality: N/A;  mons pubis as site    Current Outpatient Prescriptions  Medication Sig Dispense Refill  . atorvastatin (LIPITOR) 10 MG tablet Take 10 mg by mouth every morning.     . calcium carbonate (TUMS - DOSED IN MG ELEMENTAL CALCIUM) 500 MG chewable tablet Chew 1 tablet by mouth as needed for indigestion or heartburn.    Marland Kitchen CALCIUM PO Take 2 tablets by mouth daily.    . Cats Claw, Uncaria tomentosa, 500 MG CAPS Take 2 capsules by mouth every morning.     . cloNIDine (CATAPRES) 0.3 MG tablet Take 0.3 mg by mouth 2 (two) times daily.     . Coconut Oil 1000 MG CAPS Take 2 capsules by mouth daily.    Marland Kitchen elvitegravir-cobicistat-emtricitabine-tenofovir (GENVOYA) 150-150-200-10 MG TABS tablet Take 1 tablet by mouth daily with breakfast. (Patient taking differently: Take 1 tablet by mouth every evening. ) 30 tablet 11  . HUMALOG MIX 75/25 KWIKPEN (75-25) 100 UNIT/ML Kwikpen Inject 50 Units into the skin 2 (two) times daily.     . hydrochlorothiazide (HYDRODIURIL) 25 MG tablet Take 1 tablet (25 mg total) by mouth daily. 30 tablet 0  . Insulin Syringe-Needle U-100 (INSULIN SYRINGE 1CC/31GX5/16") 31G X 5/16" 1 ML MISC 1 Units by Does not apply route 2 (two) times daily. 30 each 0  . Lancets (ONETOUCH ULTRASOFT) lancets     . lisinopril (PRINIVIL,ZESTRIL) 10 MG tablet Take 1 tablet (10 mg total) by mouth daily. 30 tablet 0  .  metFORMIN (GLUCOPHAGE) 1000 MG tablet Take 1,000 mg by mouth 2 (two) times daily with a meal.     . ONE TOUCH ULTRA TEST test strip     . oxyCODONE-acetaminophen (PERCOCET/ROXICET) 5-325 MG tablet Take 1 tablet by mouth every 6 (six) hours as needed for severe pain. 20 tablet 0  . silver sulfADIAZINE (SILVADENE) 1 % cream Apply 1 application topically 2 (two) times daily. Apply to vulva twice daily 50 g 0   No current facility-administered medications for this visit.    Social History   Social History  . Marital Status: Married    Spouse Name: N/A  . Number of Children: N/A  . Years of Education: N/A   Occupational History  . Not on file.   Social History Main Topics  . Smoking status: Never Smoker   . Smokeless tobacco:  Never Used  . Alcohol Use: No  . Drug Use: No  . Sexual Activity: Yes    Birth Control/ Protection: None   Other Topics Concern  . Not on file   Social History Narrative    Family History  Problem Relation Age of Onset  . Hypertension Mother   . Diabetes Father   . Cancer Brother   . Breast cancer Layla Barter, MD 01/28/2016, 12:07 PM

## 2016-03-07 ENCOUNTER — Ambulatory Visit (INDEPENDENT_AMBULATORY_CARE_PROVIDER_SITE_OTHER): Payer: Medicare Other | Admitting: Internal Medicine

## 2016-03-07 ENCOUNTER — Encounter: Payer: Self-pay | Admitting: Internal Medicine

## 2016-03-07 VITALS — BP 147/85 | HR 83 | Temp 97.5°F | Wt 250.0 lb

## 2016-03-07 DIAGNOSIS — I10 Essential (primary) hypertension: Secondary | ICD-10-CM | POA: Diagnosis not present

## 2016-03-07 DIAGNOSIS — E1122 Type 2 diabetes mellitus with diabetic chronic kidney disease: Secondary | ICD-10-CM | POA: Diagnosis not present

## 2016-03-07 DIAGNOSIS — B20 Human immunodeficiency virus [HIV] disease: Secondary | ICD-10-CM

## 2016-03-07 DIAGNOSIS — Z87412 Personal history of vulvar dysplasia: Secondary | ICD-10-CM | POA: Diagnosis not present

## 2016-03-07 LAB — COMPLETE METABOLIC PANEL WITH GFR
ALT: 8 U/L (ref 6–29)
AST: 9 U/L — ABNORMAL LOW (ref 10–35)
Albumin: 3.6 g/dL (ref 3.6–5.1)
Alkaline Phosphatase: 62 U/L (ref 33–115)
BUN: 12 mg/dL (ref 7–25)
CO2: 24 mmol/L (ref 20–31)
Calcium: 9.2 mg/dL (ref 8.6–10.2)
Chloride: 103 mmol/L (ref 98–110)
Creat: 0.84 mg/dL (ref 0.50–1.10)
GFR, Est African American: >89 mL/min (ref >=60)
GFR, Est Non African American: 82 mL/min (ref >=60)
Glucose, Bld: 260 mg/dL — ABNORMAL HIGH (ref 65–99)
Potassium: 4.5 mmol/L (ref 3.5–5.3)
Sodium: 138 mmol/L (ref 135–146)
Total Bilirubin: 0.3 mg/dL (ref 0.2–1.2)
Total Protein: 7 g/dL (ref 6.1–8.1)

## 2016-03-07 LAB — CBC WITH DIFFERENTIAL/PLATELET
Basophils Absolute: 0 10*3/uL (ref 0.0–0.1)
Basophils Relative: 0 % (ref 0–1)
Eosinophils Absolute: 0.2 10*3/uL (ref 0.0–0.7)
Eosinophils Relative: 2 % (ref 0–5)
HCT: 35.4 % — ABNORMAL LOW (ref 36.0–46.0)
Hemoglobin: 12.2 g/dL (ref 12.0–15.0)
Lymphocytes Relative: 26 % (ref 12–46)
Lymphs Abs: 2.3 10*3/uL (ref 0.7–4.0)
MCH: 33.2 pg (ref 26.0–34.0)
MCHC: 34.5 g/dL (ref 30.0–36.0)
MCV: 96.5 fL (ref 78.0–100.0)
MPV: 9.8 fL (ref 8.6–12.4)
Monocytes Absolute: 0.5 10*3/uL (ref 0.1–1.0)
Monocytes Relative: 6 % (ref 3–12)
Neutro Abs: 5.9 10*3/uL (ref 1.7–7.7)
Neutrophils Relative %: 66 % (ref 43–77)
Platelets: 340 10*3/uL (ref 150–400)
RBC: 3.67 MIL/uL — ABNORMAL LOW (ref 3.87–5.11)
RDW: 14.3 % (ref 11.5–15.5)
WBC: 8.9 10*3/uL (ref 4.0–10.5)

## 2016-03-07 NOTE — Progress Notes (Signed)
Patient ID: Katrina Wolfe, female   DOB: 04/13/1966, 50 y.o.   MRN: [id number]       Patient ID: Katrina Wolfe, female   DOB: 03/17/1966, 50 y.o.   MRN: [id number]  HPI CD 4 count 440/VL71 on genvoya doing well with adherence. 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  Outpatient Encounter Prescriptions as of 03/07/2016  Medication Sig  . atorvastatin (LIPITOR) 10 MG tablet Take 10 mg by mouth every morning.   . calcium carbonate (TUMS - DOSED IN MG ELEMENTAL CALCIUM) 500 MG chewable tablet Chew 1 tablet by mouth as needed for indigestion or heartburn.  Marland Kitchen CALCIUM PO Take 2 tablets by mouth daily.  . Cats Claw, Uncaria tomentosa, 500 MG CAPS Take 2 capsules by mouth every morning.   . cloNIDine (CATAPRES) 0.3 MG tablet Take 0.3 mg by mouth 2 (two) times daily.   . Coconut Oil 1000 MG CAPS Take 2 capsules by mouth daily.  Marland Kitchen elvitegravir-cobicistat-emtricitabine-tenofovir (GENVOYA) 150-150-200-10 MG TABS tablet Take 1 tablet by mouth daily with breakfast. (Patient taking differently: Take 1 tablet by mouth every evening. )  . HUMALOG MIX 75/25 KWIKPEN (75-25) 100 UNIT/ML Kwikpen Inject 50 Units into the skin 2 (two) times daily.   . hydrochlorothiazide (HYDRODIURIL) 25 MG tablet Take 1 tablet (25 mg total) by mouth daily.  . Insulin Syringe-Needle U-100 (INSULIN SYRINGE 1CC/31GX5/16") 31G X 5/16" 1 ML MISC 1 Units by Does not apply route 2 (two) times daily.  . Lancets (ONETOUCH ULTRASOFT) lancets   . lisinopril (PRINIVIL,ZESTRIL) 10 MG tablet Take 1 tablet (10 mg total) by mouth daily.  . metFORMIN (GLUCOPHAGE) 1000 MG tablet Take 1,000 mg by mouth 2 (two) times daily with a meal.   . ONE TOUCH ULTRA TEST test strip   . silver sulfADIAZINE (SILVADENE) 1 % cream Apply 1 application topically 2 (two) times daily. Apply to vulva twice daily  . oxyCODONE-acetaminophen (PERCOCET/ROXICET) 5-325 MG tablet Take 1 tablet by mouth every 6 (six) hours  as needed for severe pain. (Patient not taking: Reported on 03/07/2016)   No facility-administered encounter medications on file as of 03/07/2016.     Patient Active Problem List   Diagnosis Date Noted  . HIV disease ([place]) 10/27/2015  . DM type 2 (diabetes mellitus, type 2) ([place]) 10/27/2015  . Hyperlipidemia 10/27/2015  . Essential hypertension 10/27/2015  . Severe vulvar dysplasia, histologically confirmed 10/15/2015     Health Maintenance Due  Topic Date Due  . HEMOGLOBIN A1C  21-Dec-1965  . FOOT EXAM  08/16/1976  . OPHTHALMOLOGY EXAM  08/16/1976  . TETANUS/TDAP  08/16/1985     Review of Systems Review of Systems  Constitutional: Negative for fever, chills, diaphoresis, activity change, appetite change, fatigue and unexpected weight change.  HENT: Negative for congestion, sore throat, rhinorrhea, sneezing, trouble swallowing and sinus pressure.  Eyes: Negative for photophobia and visual disturbance.  Respiratory: Negative for cough, chest tightness, shortness of breath, wheezing and stridor.  Cardiovascular: Negative for chest pain, palpitations and leg swelling.  Gastrointestinal: Negative for nausea, vomiting, abdominal pain, diarrhea, constipation, blood in stool, abdominal distention and anal bleeding.  Genitourinary: Negative for dysuria, hematuria, flank pain and difficulty urinating.  Musculoskeletal: Negative for myalgias, back pain, joint swelling, arthralgias and gait problem.  Skin: Negative for color change, pallor, rash and wound.  Neurological: Negative for dizziness, tremors, weakness and light-headedness.  Hematological: Negative for adenopathy. Does not bruise/bleed easily.  Psychiatric/Behavioral:  Negative for behavioral problems, confusion, sleep disturbance, dysphoric mood, decreased concentration and agitation.    Physical Exam   BP 147/85 mmHg  Pulse 83  Temp(Src) 97.5 F (36.4 C) (Oral)  Wt 250 lb (113.399 kg)  LMP 07/07/2015 (Exact Date)  Lab  Results  Component Value Date   CD4TCELL 15* 11/24/2015   Lab Results  Component Value Date   CD4TABS 440 11/24/2015   CD4TABS 500 10/13/2015   Lab Results  Component Value Date   HIV1RNAQUANT 71* 11/24/2015   Lab Results  Component Value Date   HEPBSAB NEG 10/13/2015   No results found for: RPR  CBC Lab Results  Component Value Date   WBC 9.2 11/26/2015   RBC 4.07 11/26/2015   HGB 14.6 01/07/2016   HCT 43.0 01/07/2016   PLT 285 11/26/2015   MCV 94.6 11/26/2015   MCH 31.9 11/26/2015   MCHC 33.8 11/26/2015   RDW 13.1 11/26/2015   LYMPHSABS 2.3 11/26/2015   MONOABS 0.7 11/26/2015   EOSABS 0.1 11/26/2015   BASOSABS 0.0 11/26/2015   BMET Lab Results  Component Value Date   NA 141 01/07/2016   K 4.6 01/07/2016   CL 104 01/07/2016   CO2 29 12/08/2015   GLUCOSE 143* 01/07/2016   BUN 20 01/07/2016   CREATININE 1.00 01/07/2016   CALCIUM 10.3* 12/08/2015   GFRNONAA 82 12/08/2015   GFRAA >89 12/08/2015     Assessment and Plan   HIV = will continue on genvoya and wil check labs today  htn = will repeat BP, not at goal. May need to titrate up lisinopril. She is unclear if she takes 10 vs. 20mg . i have asked her to take 20mg  and follow up with Dr. Lorenda Hatchet.  DM, poorly controlled = slowly better control. BS this am at 150s. She uses metformin 1000mg  bid plus 50 u humalog bid. Sees dr. Lorenda Hatchet soon for titration of meds. Still concern that she needs better optimization  Hx of severe vulvar dysplasia = she is continuing to do sitz bath. Her pathology showed VIN3 with clear margins which is good news. Will need to do routine follow up  rtc in 3 months

## 2016-03-08 LAB — T-HELPER CELL (CD4) - (RCID CLINIC ONLY)
CD4 % Helper T Cell: 15 % — ABNORMAL LOW (ref 33–55)
CD4 T Cell Abs: 330 /uL — ABNORMAL LOW (ref 400–2700)

## 2016-03-09 LAB — HIV-1 RNA QUANT-NO REFLEX-BLD
HIV 1 RNA Quant: <20 copies/mL (ref <20)
HIV-1 RNA Quant, Log: <1.3 Log copies/mL (ref <1.30)

## 2016-03-13 ENCOUNTER — Encounter: Payer: Self-pay | Admitting: Gynecology

## 2016-03-13 ENCOUNTER — Ambulatory Visit: Payer: Medicare Other | Attending: Gynecology | Admitting: Gynecology

## 2016-03-13 VITALS — BP 123/70 | HR 67 | Temp 97.6°F | Resp 19 | Wt 251.1 lb

## 2016-03-13 DIAGNOSIS — Z86008 Personal history of in-situ neoplasm of other site: Secondary | ICD-10-CM

## 2016-03-13 DIAGNOSIS — K219 Gastro-esophageal reflux disease without esophagitis: Secondary | ICD-10-CM | POA: Insufficient documentation

## 2016-03-13 DIAGNOSIS — Z794 Long term (current) use of insulin: Secondary | ICD-10-CM | POA: Insufficient documentation

## 2016-03-13 DIAGNOSIS — E119 Type 2 diabetes mellitus without complications: Secondary | ICD-10-CM | POA: Diagnosis not present

## 2016-03-13 DIAGNOSIS — H5442 Blindness, left eye, normal vision right eye: Secondary | ICD-10-CM | POA: Diagnosis not present

## 2016-03-13 DIAGNOSIS — N182 Chronic kidney disease, stage 2 (mild): Secondary | ICD-10-CM | POA: Insufficient documentation

## 2016-03-13 DIAGNOSIS — D071 Carcinoma in situ of vulva: Secondary | ICD-10-CM

## 2016-03-13 DIAGNOSIS — M1990 Unspecified osteoarthritis, unspecified site: Secondary | ICD-10-CM | POA: Insufficient documentation

## 2016-03-13 DIAGNOSIS — Z809 Family history of malignant neoplasm, unspecified: Secondary | ICD-10-CM | POA: Insufficient documentation

## 2016-03-13 DIAGNOSIS — Z8249 Family history of ischemic heart disease and other diseases of the circulatory system: Secondary | ICD-10-CM | POA: Diagnosis not present

## 2016-03-13 DIAGNOSIS — I129 Hypertensive chronic kidney disease with stage 1 through stage 4 chronic kidney disease, or unspecified chronic kidney disease: Secondary | ICD-10-CM | POA: Diagnosis not present

## 2016-03-13 DIAGNOSIS — Z833 Family history of diabetes mellitus: Secondary | ICD-10-CM | POA: Insufficient documentation

## 2016-03-13 DIAGNOSIS — D259 Leiomyoma of uterus, unspecified: Secondary | ICD-10-CM | POA: Diagnosis not present

## 2016-03-13 NOTE — Progress Notes (Signed)
Consult Note: Gyn-Onc   Katrina Wolfe 50 y.o. female  Chief Complaint  Patient presents with  . 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 No evidence of recurrent disease.  Plan:   The patient given information regarding symptoms associated with recurrent disease. She'll contact me she develops any new problems. Otherwise she return in 6 months.  Interval history: Patient underwent wide local excision of a verrocoid lesion of her mons on 01/07/2016. She also had extensive laser vaporization of vulvar VIN 3. Final pathology of the excisional biopsy of the mons showed VIN 3 with negative margins.   She reports that all surgical sites are now well-healed. She's not having any pain. She denies any other pelvic symptoms bleeding or discharge.  HPI: 50 year old African American female seen in consultation the request of Dr.Varnado regarding management of recurrent VIN 3. Patient presented with lesions on the vulva and mons which been biopsied showing both with severe dysplasia. Patient reports that she had laser vaporization of the vulva in 2010 in [place] for similar condition. She is not on any immunosuppressive therapies. Pap smears in the past of a normal period the patient is not had a menstrual period since July 2016.  She underwent wide local excision of the lesion on her mons (VIN-III) as well as laser vaporization of several lesions of the vulva on 01/07/2016.  Review of Systems:10 point review of systems is negative except as noted in interval history.   Vitals: Blood pressure 123/70, pulse 67, temperature 97.6 F (36.4 C), temperature source Oral, resp. rate 19, weight 251 lb 1.6 oz (113.898 kg), last menstrual period 07/07/2015, SpO2 100 %.  Physical Exam: General : The patient is a healthy woman in no acute distress.  HEENT: normocephalic, extraoccular movements normal; neck is supple without thyromegally  Lynphnodes: Supraclavicular and  inguinal nodes not enlarged  Abdomen: Soft, non-tender, no ascites, no organomegally, no masses, no hernias  Pelvic:   Mons: The incision on the mons is healed.. EGBUS: Normal female .  The 3 major areas were lasered on the vulva are completely healed. His no evidence of erythema cellulitis or infection. Vagina: Normal, no lesions  Urethra and Bladder: Normal, non-tender  Cervix: Surgically absent  Uterus: Surgically absent  Bi-manual examination: Non-tender; no adenxal masses or nodularity  Rectal: normal sphincter tone, no masses, no blood  Lower extremities: No edema or varicosities. Normal range of motion      Allergies  Allergen Reactions  . Shellfish Allergy Anaphylaxis    All types shellfish  . Bactrim [Sulfamethoxazole-Trimethoprim] Hives    Past Medical History  Diagnosis Date  . Hypertension   . HIV (human immunodeficiency virus infection) ([place])     [place]-- DR Carlyle Basques  . Type 2 diabetes mellitus ([place])   . VIN III (vulvar intraepithelial neoplasia III)     and Verrucoid lesion on the mons  . CKD (chronic kidney disease), stage II   . Congenital cardiomegaly     per pt has always been told since childhood and siblings   . Intermittent palpitations     mild-- no meds  . GERD (gastroesophageal reflux disease)   . Arthritis     knees  . Wears glasses   . Legally blind in left eye, as defined in [place]     trauma as child  . Uterine fibroid   . Abnormal uterine bleeding (  AUB)   . History of genital warts     Past Surgical History  Procedure Laterality Date  . Corneal transplant Left 2006    failed  . Cesarean section  2001  &  1984  . D & c hysteroscopy /  resection fibroid  2004  . Knee arthroscopy w/ acl reconstruction Bilateral 2008  . Co2 laser application N/A [id number]    Procedure: CO2 LASER APPLICATION AND WIDE VOCAL EXCSION OF LESION ON MONS PUBIS;  Surgeon: Marti Sleigh, MD;  Location: [facility];  Service: Gynecology;  Laterality: N/A;   CASE CANCELLED   . Co2 laser application N/A [id number]    Procedure: CO2 LASER OF VULVAR;  Surgeon: Marti Sleigh, MD;  Location[facility];  Service: Gynecology;  Laterality: N/A;  . Vulvectomy N/A 01/07/2016    Procedure: WIDE LOCAL EXCISION;  Surgeon: Marti Sleigh, MD;  Location[facility];  Service: Gynecology;  Laterality: N/A;  mons pubis as site    Current Outpatient Prescriptions  Medication Sig Dispense Refill  . atorvastatin (LIPITOR) 10 MG tablet Take 10 mg by mouth every morning.     . calcium carbonate (TUMS - DOSED IN MG ELEMENTAL CALCIUM) 500 MG chewable tablet Chew 1 tablet by mouth as needed for indigestion or heartburn.    Marland Kitchen CALCIUM PO Take 2 tablets by mouth daily.    . Cats Claw, Uncaria tomentosa, 500 MG CAPS Take 2 capsules by mouth every morning.     . cloNIDine (CATAPRES) 0.3 MG tablet Take 0.3 mg by mouth 2 (two) times daily.     . Coconut Oil 1000 MG CAPS Take 2 capsules by mouth daily.    Marland Kitchen elvitegravir-cobicistat-emtricitabine-tenofovir (GENVOYA) 150-150-200-10 MG TABS tablet Take 1 tablet by mouth daily with breakfast. (Patient taking differently: Take 1 tablet by mouth every evening. ) 30 tablet 11  . HUMALOG MIX 75/25 KWIKPEN (75-25) 100 UNIT/ML Kwikpen Inject 50 Units into the skin 2 (two) times daily.     . hydrochlorothiazide (HYDRODIURIL) 25 MG tablet Take 1 tablet (25 mg total) by mouth daily. 30 tablet 0  . Insulin Syringe-Needle U-100 (INSULIN SYRINGE 1CC/31GX5/16") 31G X 5/16" 1 ML MISC 1 Units by Does not apply route 2 (two) times daily. 30 each 0  . Lancets (ONETOUCH ULTRASOFT) lancets     . lisinopril (PRINIVIL,ZESTRIL) 10 MG tablet Take 1 tablet (10 mg total) by mouth daily. 30 tablet 0  . metFORMIN (GLUCOPHAGE) 1000 MG tablet Take 1,000 mg by mouth 2 (two) times daily with a meal.     . ONE TOUCH ULTRA TEST test strip     . silver sulfADIAZINE  (SILVADENE) 1 % cream Apply 1 application topically 2 (two) times daily. Apply to vulva twice daily 50 g 0   No current facility-administered medications for this visit.    Social History   Social History  . Marital Status: Married    Spouse Name: N/A  . Number of Children: N/A  . Years of Education: N/A   Occupational History  . Not on file.   Social History Main Topics  . Smoking status: Never Smoker   . Smokeless tobacco: Never Used  . Alcohol Use: No  . Drug Use: No  . Sexual Activity: Yes    Birth Control/ Protection: None   Other Topics Concern  . Not on file   Social History Narrative    Family History  Problem Relation Age of Onset  . Hypertension Mother   .  Diabetes Father   . Cancer Brother   . Breast cancer Layla Barter, MD 03/13/2016, 10:16 AM

## 2016-03-13 NOTE — Patient Instructions (Addendum)
Return to see [place] in 6 months or as needed , please call in late July to scheduled your appointment .  Thank you !

## 2016-06-08 ENCOUNTER — Other Ambulatory Visit: Payer: Self-pay | Admitting: Internal Medicine

## 2016-06-08 DIAGNOSIS — Z1231 Encounter for screening mammogram for malignant neoplasm of breast: Secondary | ICD-10-CM

## 2016-06-15 ENCOUNTER — Encounter: Payer: Self-pay | Admitting: Internal Medicine

## 2016-06-15 ENCOUNTER — Ambulatory Visit (INDEPENDENT_AMBULATORY_CARE_PROVIDER_SITE_OTHER): Payer: Medicare Other | Admitting: Internal Medicine

## 2016-06-15 VITALS — BP 138/86 | HR 68 | Temp 98.0°F | Ht 70.0 in | Wt 257.0 lb

## 2016-06-15 DIAGNOSIS — E1122 Type 2 diabetes mellitus with diabetic chronic kidney disease: Secondary | ICD-10-CM | POA: Diagnosis not present

## 2016-06-15 DIAGNOSIS — I10 Essential (primary) hypertension: Secondary | ICD-10-CM

## 2016-06-15 DIAGNOSIS — B20 Human immunodeficiency virus [HIV] disease: Secondary | ICD-10-CM

## 2016-06-15 DIAGNOSIS — H18892 Other specified disorders of cornea, left eye: Secondary | ICD-10-CM

## 2016-06-15 DIAGNOSIS — Z87412 Personal history of vulvar dysplasia: Secondary | ICD-10-CM | POA: Diagnosis not present

## 2016-06-15 NOTE — Progress Notes (Signed)
Patient ID: Katrina Wolfe, female   DOB: 1966-09-04, 50 y.o.   MRN: [id number]       Patient ID: Katrina Wolfe, female   DOB: February 15, 1966, 50 y.o.   MRN: [id number]  HPI 50yo F with hiv disease, CD 4 count 330/VL<20, on genvoya. Doing well with her adherence to hiv regimen. Has been seeing her pcp for diabetes, under better control. Recently saw optho and considering redoing corneal transplant to left eye  Outpatient Encounter Prescriptions as of 06/15/2016  Medication Sig  . atorvastatin (LIPITOR) 10 MG tablet Take 10 mg by mouth every morning.   . calcium carbonate (TUMS - DOSED IN MG ELEMENTAL CALCIUM) 500 MG chewable tablet Chew 1 tablet by mouth as needed for indigestion or heartburn.  Marland Kitchen CALCIUM PO Take 2 tablets by mouth daily.  . Cats Claw, Uncaria tomentosa, 500 MG CAPS Take 2 capsules by mouth every morning.   . cloNIDine (CATAPRES) 0.3 MG tablet Take 0.3 mg by mouth 2 (two) times daily.   . Coconut Oil 1000 MG CAPS Take 2 capsules by mouth daily.  Marland Kitchen elvitegravir-cobicistat-emtricitabine-tenofovir (GENVOYA) 150-150-200-10 MG TABS tablet Take 1 tablet by mouth daily with breakfast. (Patient taking differently: Take 1 tablet by mouth every evening. )  . HUMALOG MIX 75/25 KWIKPEN (75-25) 100 UNIT/ML Kwikpen Inject 50 Units into the skin 2 (two) times daily.   . hydrochlorothiazide (HYDRODIURIL) 25 MG tablet Take 1 tablet (25 mg total) by mouth daily.  . Insulin Syringe-Needle U-100 (INSULIN SYRINGE 1CC/31GX5/16") 31G X 5/16" 1 ML MISC 1 Units by Does not apply route 2 (two) times daily.  . Lancets (ONETOUCH ULTRASOFT) lancets   . lisinopril (PRINIVIL,ZESTRIL) 10 MG tablet Take 1 tablet (10 mg total) by mouth daily.  . metFORMIN (GLUCOPHAGE) 1000 MG tablet Take 1,000 mg by mouth 2 (two) times daily with a meal.   . ONE TOUCH ULTRA TEST test strip   . silver sulfADIAZINE (SILVADENE) 1 % cream Apply 1 application topically 2 (two) times daily. Apply to vulva twice daily (Patient not taking:  Reported on 06/15/2016)   No facility-administered encounter medications on file as of 06/15/2016.     Patient Active Problem List   Diagnosis Date Noted  . HIV disease ([place]) 10/27/2015  . DM type 2 (diabetes mellitus, type 2) ([place]) 10/27/2015  . Hyperlipidemia 10/27/2015  . Essential hypertension 10/27/2015  . Severe vulvar dysplasia, histologically confirmed 10/15/2015     Health Maintenance Due  Topic Date Due  . HEMOGLOBIN A1C  [birth year]  . FOOT EXAM  08/16/1976  . OPHTHALMOLOGY EXAM  08/16/1976  . TETANUS/TDAP  08/16/1985     Review of Systems Review of Systems  Constitutional: Negative for fever, chills, diaphoresis, activity change, appetite change, fatigue and unexpected weight change.  HENT: Negative for congestion, sore throat, rhinorrhea, sneezing, trouble swallowing and sinus pressure.  Eyes: Negative for photophobia and visual disturbance.  Respiratory: Negative for cough, chest tightness, shortness of breath, wheezing and stridor.  Cardiovascular: Negative for chest pain, palpitations and leg swelling.  Gastrointestinal: Negative for nausea, vomiting, abdominal pain, diarrhea, constipation, blood in stool, abdominal distention and anal bleeding.  Genitourinary: Negative for dysuria, hematuria, flank pain and difficulty urinating.  Musculoskeletal: Negative for myalgias, back pain, joint swelling, arthralgias and gait problem.  Skin: Negative for color change, pallor, rash and wound.  Neurological: Negative for dizziness, tremors, weakness and light-headedness.  Hematological: Negative for adenopathy. Does not bruise/bleed easily.  Psychiatric/Behavioral: Negative for behavioral problems, confusion, sleep disturbance, dysphoric mood, decreased  concentration and agitation.    Physical Exam   BP 138/86 mmHg  Pulse 68  Temp(Src) 98 F (36.7 C) (Oral)  Ht 5\' 10"  (1.778 m)  Wt 257 lb (116.574 kg)  BMI 36.88 kg/m2  LMP 07/07/2015 (Exact Date) Physical Exam    Constitutional:  oriented to person, place, and time. appears well-developed and well-nourished. No distress.  HENT: [place]/AT, left eye clouded lens, no scleral icterus Mouth/Throat: Oropharynx is clear and moist. No oropharyngeal exudate.  Cardiovascular: Normal rate, regular rhythm and normal heart sounds. Exam reveals no gallop and no friction rub.  No murmur heard.  Pulmonary/Chest: Effort normal and breath sounds normal. No respiratory distress.  has no wheezes.  Neck = supple, no nuchal rigidity Abdominal: Soft. Bowel sounds are normal.  exhibits no distension. There is no tenderness.  Lymphadenopathy: no cervical adenopathy. No axillary adenopathy Neurological: alert and oriented to person, place, and time.  Skin: Skin is warm and dry. No rash noted. No erythema.  Psychiatric: a normal mood and affect.  behavior is normal.    Lab Results  Component Value Date   CD4TCELL 15* 03/07/2016   Lab Results  Component Value Date   CD4TABS 330* 03/07/2016   CD4TABS 440 11/24/2015   CD4TABS 500 10/13/2015   Lab Results  Component Value Date   HIV1RNAQUANT <20 03/07/2016   Lab Results  Component Value Date   HEPBSAB NEG 10/13/2015   No results found for: RPR  CBC Lab Results  Component Value Date   WBC 8.9 03/07/2016   RBC 3.67* 03/07/2016   HGB 12.2 03/07/2016   HCT 35.4* 03/07/2016   PLT 340 03/07/2016   MCV 96.5 03/07/2016   MCH 33.2 03/07/2016   MCHC 34.5 03/07/2016   RDW 14.3 03/07/2016   LYMPHSABS 2.3 03/07/2016   MONOABS 0.5 03/07/2016   EOSABS 0.2 03/07/2016   BASOSABS 0.0 03/07/2016   BMET Lab Results  Component Value Date   NA 138 03/07/2016   K 4.5 03/07/2016   CL 103 03/07/2016   CO2 24 03/07/2016   GLUCOSE 260* 03/07/2016   BUN 12 03/07/2016   CREATININE 0.84 03/07/2016   CALCIUM 9.2 03/07/2016   GFRNONAA 82 03/07/2016   GFRAA >89 03/07/2016     Assessment and Plan   hiv disease = well controlled. Continue on genvoya  htn = fairly good  control at lisinopril 10mg  dosing. If it worsens at next visit, will increase dose  iddm 2 = reading march suggests poor control. She states that she has not had reading at home greater than 200. She has done some significant diet modification, thus not requiring increase in her insulin. She is following up with pcp  Vin 3 = has follow up appt in sept with gynecology  Left eye partial blindness =she is thinking about redoing corneal transplant to left eye. Previous childhood injury but her current cornea is clouded, optho offered redo to have some improvement in vision

## 2016-06-22 ENCOUNTER — Ambulatory Visit
Admission: RE | Admit: 2016-06-22 | Discharge: 2016-06-22 | Disposition: A | Discharge status: Home / Self Care | Payer: Medicare Other | Source: Ambulatory Visit | Attending: Internal Medicine | Admitting: Internal Medicine

## 2016-06-22 DIAGNOSIS — Z1231 Encounter for screening mammogram for malignant neoplasm of breast: Secondary | ICD-10-CM

## 2016-06-22 IMAGING — MG DIGITAL SCREENING BILATERAL MAMMOGRAM WITH CAD
4 series · 4 of 4 positions shown · non-contrast
Comparison: None.

CLINICAL DATA: Screening.

EXAM:
DIGITAL SCREENING BILATERAL MAMMOGRAM WITH CAD

[R CC]
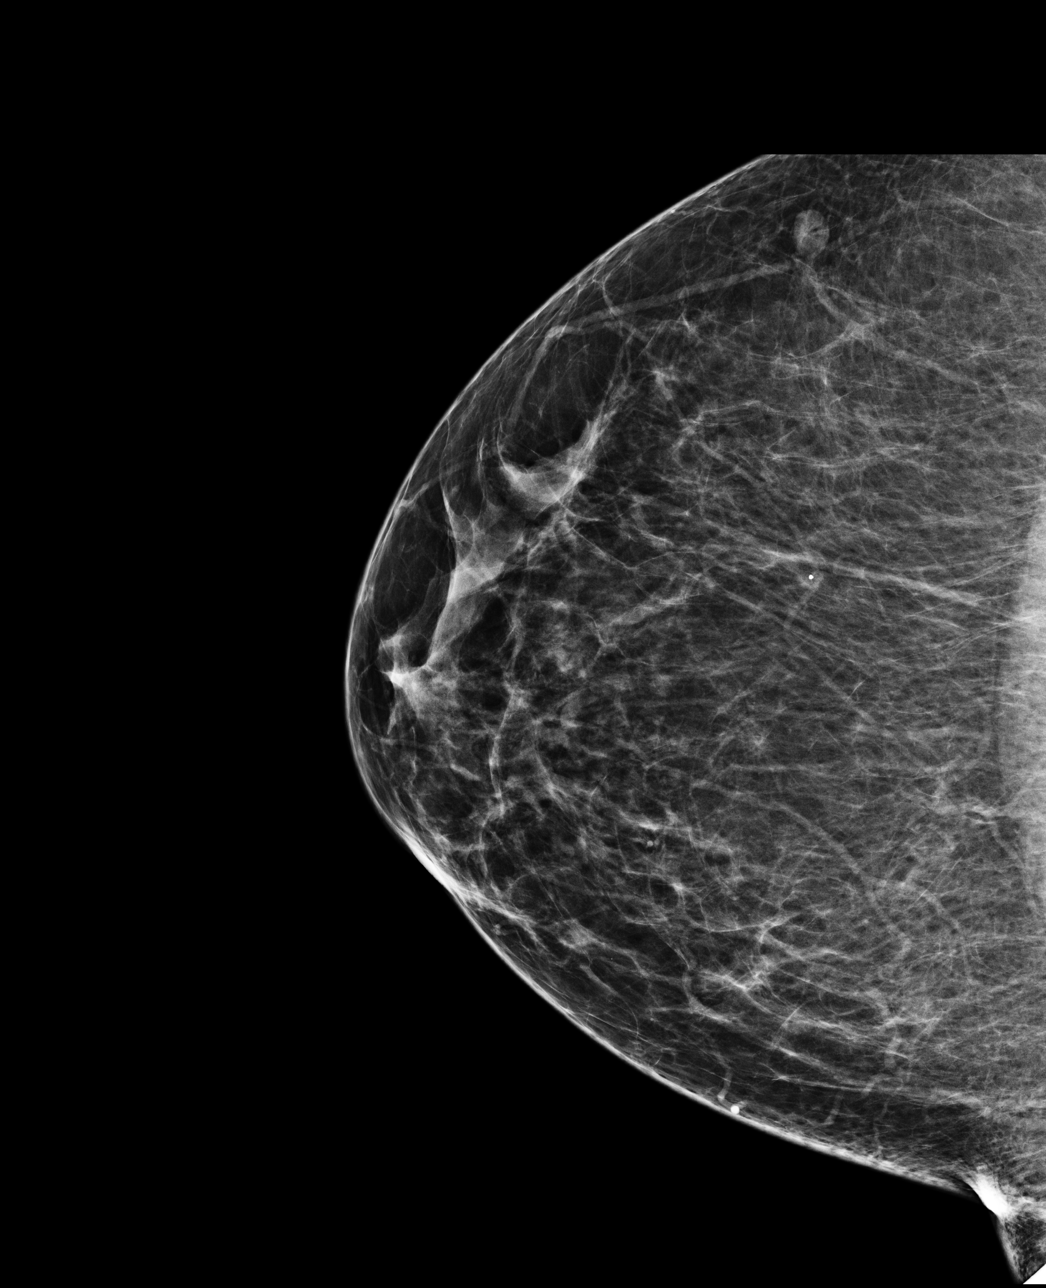

[L MLO]
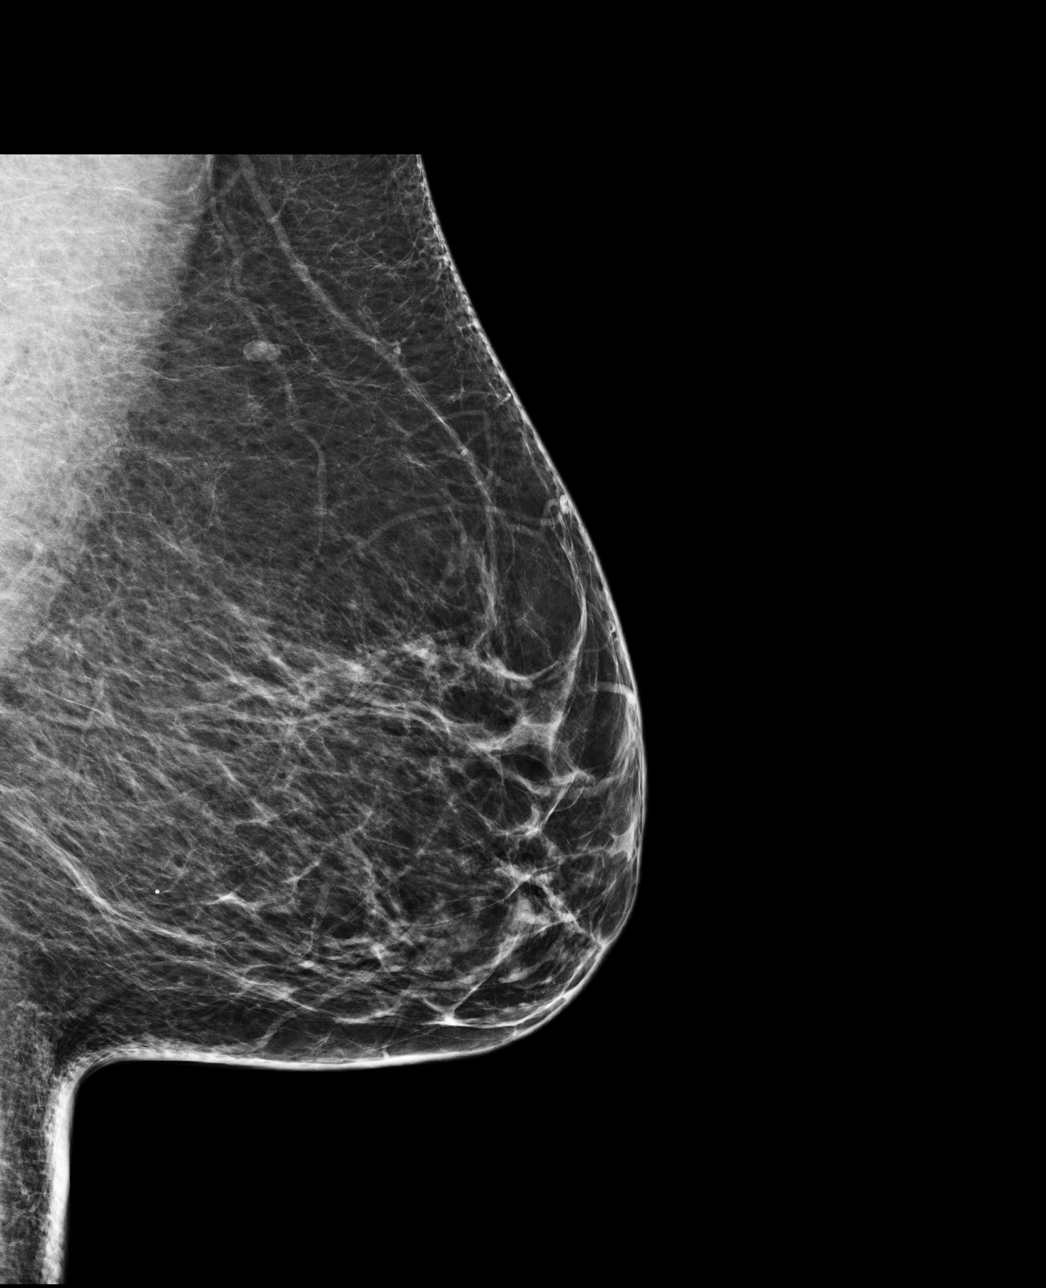

[R MLO]
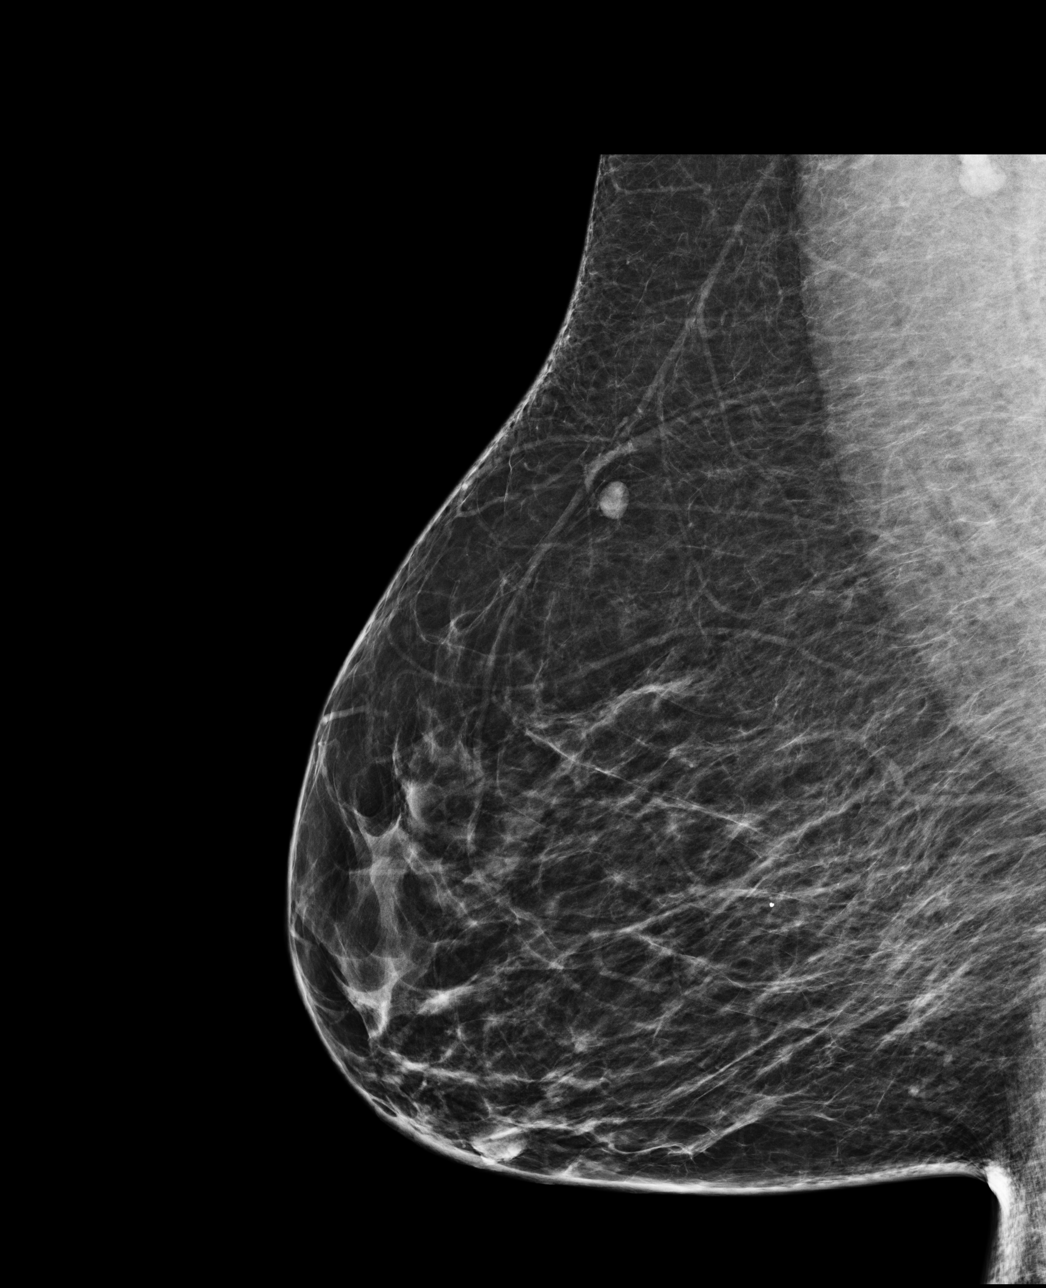

[L CC]
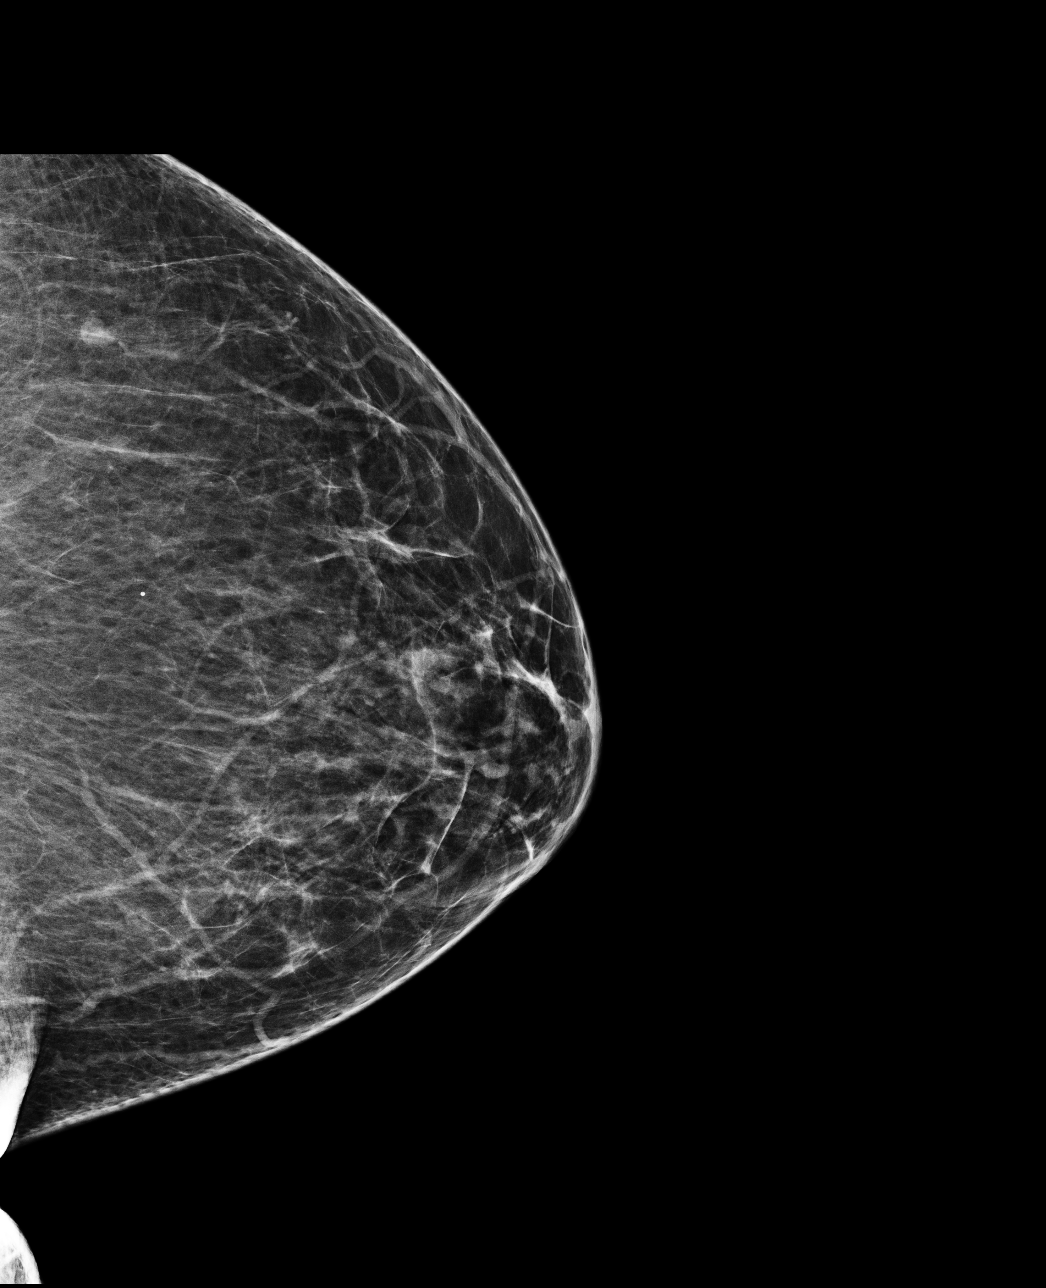

[4 of 4 positions shown; findings below may reference images not displayed]

ACR Breast Density Category b: There are scattered areas of
fibroglandular density.
FINDINGS: There are no findings suspicious for malignancy. Images were
processed with CAD.
IMPRESSION: No mammographic evidence of malignancy. A result letter of this
screening mammogram will be mailed directly to the patient.

RECOMMENDATION:
Screening mammogram in one year. (Code:[GD])

BI-RADS CATEGORY  1: Negative.

## 2016-08-17 ENCOUNTER — Other Ambulatory Visit: Payer: Medicare Other

## 2016-08-17 DIAGNOSIS — B20 Human immunodeficiency virus [HIV] disease: Secondary | ICD-10-CM

## 2016-08-17 DIAGNOSIS — Z79899 Other long term (current) drug therapy: Secondary | ICD-10-CM

## 2016-08-17 DIAGNOSIS — Z113 Encounter for screening for infections with a predominantly sexual mode of transmission: Secondary | ICD-10-CM

## 2016-08-17 LAB — CBC WITH DIFFERENTIAL/PLATELET
Basophils Absolute: 0 cells/uL (ref 0–200)
Basophils Relative: 0 %
Eosinophils Absolute: 660 cells/uL — ABNORMAL HIGH (ref 15–500)
Eosinophils Relative: 6 %
HCT: 34.1 % — ABNORMAL LOW (ref 35.0–45.0)
Hemoglobin: 11.5 g/dL — ABNORMAL LOW (ref 11.7–15.5)
Lymphocytes Relative: 26 %
Lymphs Abs: 2860 cells/uL (ref 850–3900)
MCH: 32.1 pg (ref 27.0–33.0)
MCHC: 33.7 g/dL (ref 32.0–36.0)
MCV: 95.3 fL (ref 80.0–100.0)
MPV: 10.4 fL (ref 7.5–12.5)
Monocytes Absolute: 770 cells/uL (ref 200–950)
Monocytes Relative: 7 %
Neutro Abs: 6710 cells/uL (ref 1500–7800)
Neutrophils Relative %: 61 %
Platelets: 347 10*3/uL (ref 140–400)
RBC: 3.58 MIL/uL — ABNORMAL LOW (ref 3.80–5.10)
RDW: 14.3 % (ref 11.0–15.0)
WBC: 11 10*3/uL — ABNORMAL HIGH (ref 3.8–10.8)

## 2016-08-17 LAB — COMPLETE METABOLIC PANEL WITH GFR
ALT: 8 U/L (ref 6–29)
AST: 10 U/L (ref 10–35)
Albumin: 3.6 g/dL (ref 3.6–5.1)
Alkaline Phosphatase: 66 U/L (ref 33–130)
BUN: 13 mg/dL (ref 7–25)
CO2: 20 mmol/L (ref 20–31)
Calcium: 9 mg/dL (ref 8.6–10.4)
Chloride: 110 mmol/L (ref 98–110)
Creat: 0.98 mg/dL (ref 0.50–1.05)
GFR, Est African American: 78 mL/min (ref >=60)
GFR, Est Non African American: 67 mL/min (ref >=60)
Glucose, Bld: 84 mg/dL (ref 65–99)
Potassium: 4 mmol/L (ref 3.5–5.3)
Sodium: 142 mmol/L (ref 135–146)
Total Bilirubin: 0.3 mg/dL (ref 0.2–1.2)
Total Protein: 7 g/dL (ref 6.1–8.1)

## 2016-08-17 LAB — LIPID PANEL
Cholesterol: 218 mg/dL — ABNORMAL HIGH (ref 125–200)
HDL: 90 mg/dL (ref >=46)
LDL Cholesterol: 112 mg/dL (ref <130)
Total CHOL/HDL Ratio: 2.4 Ratio (ref <=5.0)
Triglycerides: 80 mg/dL (ref <150)
VLDL: 16 mg/dL (ref <30)

## 2016-08-18 LAB — HIV-1 RNA QUANT-NO REFLEX-BLD
HIV 1 RNA Quant: <20 copies/mL (ref <20)
HIV-1 RNA Quant, Log: <1.3 Log copies/mL (ref <1.30)

## 2016-08-18 LAB — T-HELPER CELL (CD4) - (RCID CLINIC ONLY)
CD4 % Helper T Cell: 18 % — ABNORMAL LOW (ref 33–55)
CD4 T Cell Abs: 520 /uL (ref 400–2700)

## 2016-08-18 LAB — RPR

## 2016-08-31 ENCOUNTER — Encounter: Payer: Self-pay | Admitting: Internal Medicine

## 2016-08-31 ENCOUNTER — Ambulatory Visit (INDEPENDENT_AMBULATORY_CARE_PROVIDER_SITE_OTHER): Payer: Medicare Other | Admitting: Internal Medicine

## 2016-08-31 DIAGNOSIS — Z23 Encounter for immunization: Secondary | ICD-10-CM | POA: Diagnosis not present

## 2016-08-31 NOTE — Progress Notes (Signed)
Patient ID: Katrina Wolfe, female   DOB: 1966/02/23, 50 y.o.   MRN: [id number]  HPI Katrina Wolfe is a pleasant 50yo F with HIV disease, diabetes, HTN, HLD. CD 4 count of 520/VL<20, currently on genvoya.she is doing well with her medication. She denies any recent illnesses.  Outpatient Encounter Prescriptions as of 08/31/2016  Medication Sig  . atorvastatin (LIPITOR) 10 MG tablet Take 10 mg by mouth every morning.   . calcium carbonate (TUMS - DOSED IN MG ELEMENTAL CALCIUM) 500 MG chewable tablet Chew 1 tablet by mouth as needed for indigestion or heartburn.  Marland Kitchen CALCIUM PO Take 2 tablets by mouth daily.  . Cats Claw, Uncaria tomentosa, 500 MG CAPS Take 2 capsules by mouth every morning.   . cloNIDine (CATAPRES) 0.3 MG tablet Take 0.3 mg by mouth 2 (two) times daily.   . Coconut Oil 1000 MG CAPS Take 2 capsules by mouth daily.  Marland Kitchen elvitegravir-cobicistat-emtricitabine-tenofovir (GENVOYA) 150-150-200-10 MG TABS tablet Take 1 tablet by mouth daily with breakfast. (Patient taking differently: Take 1 tablet by mouth every evening. )  . HUMALOG MIX 75/25 KWIKPEN (75-25) 100 UNIT/ML Kwikpen Inject 50 Units into the skin 2 (two) times daily.   . hydrochlorothiazide (HYDRODIURIL) 25 MG tablet Take 1 tablet (25 mg total) by mouth daily.  . Insulin Syringe-Needle U-100 (INSULIN SYRINGE 1CC/31GX5/16") 31G X 5/16" 1 ML MISC 1 Units by Does not apply route 2 (two) times daily.  . Lancets (ONETOUCH ULTRASOFT) lancets   . lisinopril (PRINIVIL,ZESTRIL) 10 MG tablet Take 1 tablet (10 mg total) by mouth daily.  . metFORMIN (GLUCOPHAGE) 1000 MG tablet Take 1,000 mg by mouth 2 (two) times daily with a meal.   . ONE TOUCH ULTRA TEST test strip   . silver sulfADIAZINE (SILVADENE) 1 % cream Apply 1 application topically 2 (two) times daily. Apply to vulva twice daily (Patient not taking: Reported on 08/31/2016)   No facility-administered encounter medications on file as of 08/31/2016.      Patient Active Problem  List   Diagnosis Date Noted  . HIV disease ([place]) 10/27/2015  . DM type 2 (diabetes mellitus, type 2) ([place]) 10/27/2015  . Hyperlipidemia 10/27/2015  . Essential hypertension 10/27/2015  . Severe vulvar dysplasia, histologically confirmed 10/15/2015     Health Maintenance Due  Topic Date Due  . HEMOGLOBIN A1C  02[birth year]  . FOOT EXAM  08/16/1976  . OPHTHALMOLOGY EXAM  08/16/1976  . TETANUS/TDAP  08/16/1985  . INFLUENZA VACCINE  07/18/2016  . COLONOSCOPY  08/16/2016     Review of Systems 10 point ros is negative Physical Exam   BP (!) 147/92   Pulse 74   Temp 97.6 F (36.4 C)   Wt 258 lb (117 kg)   LMP 07/07/2015 Comment: AUB  SpO2 99%   BMI 37.02 kg/m   Constitutional:  oriented to person, place, and time. appears well-developed and well-nourished. No distress.  HENT: [place]/AT, left lens cloudy, no scleral icterus Mouth/Throat: Oropharynx is clear and moist. No oropharyngeal exudate.  Cardiovascular: Normal rate, regular rhythm and normal heart sounds. Exam reveals no gallop and no friction rub.  No murmur heard.  Pulmonary/Chest: Effort normal and breath sounds normal. No respiratory distress.  has no wheezes.  Neck = supple, no nuchal rigidity Abdominal: Soft. Bowel sounds are normal.  exhibits no distension. There is no tenderness.  Lymphadenopathy: no cervical adenopathy. No axillary adenopathy Neurological: alert and oriented to person, place, and time.  Skin: Skin is warm and dry. No rash  noted. No erythema.  Psychiatric: a normal mood and affect.  behavior is normal.   Lab Results  Component Value Date   CD4TCELL 18 (L) 08/17/2016   Lab Results  Component Value Date   CD4TABS 520 08/17/2016   CD4TABS 330 (L) 03/07/2016   CD4TABS 440 11/24/2015   Lab Results  Component Value Date   HIV1RNAQUANT <20 08/17/2016   Lab Results  Component Value Date   HEPBSAB NEG 10/13/2015   No results found for: RPR  CBC Lab Results  Component Value Date   WBC 11.0  (H) 08/17/2016   RBC 3.58 (L) 08/17/2016   HGB 11.5 (L) 08/17/2016   HCT 34.1 (L) 08/17/2016   PLT 347 08/17/2016   MCV 95.3 08/17/2016   MCH 32.1 08/17/2016   MCHC 33.7 08/17/2016   RDW 14.3 08/17/2016   LYMPHSABS 2,860 08/17/2016   MONOABS 770 08/17/2016   EOSABS 660 (H) 08/17/2016   BASOSABS 0 08/17/2016   BMET Lab Results  Component Value Date   NA 142 08/17/2016   K 4.0 08/17/2016   CL 110 08/17/2016   CO2 20 08/17/2016   GLUCOSE 84 08/17/2016   BUN 13 08/17/2016   CREATININE 0.98 08/17/2016   CALCIUM 9.0 08/17/2016   GFRNONAA 67 08/17/2016   GFRAA 78 08/17/2016     Assessment and Plan  hiv disease =well controlled,continue on genvoya  Health maintenance = plan on getting flu vaccine today  htn = repeat BP, sBP 140s. will increase lisinopril to 20mg  daily if still elevated. She sees her pcp at the end of the month, dr Lorenda Hatchet to re-evaluate  Left eye corneal cloudiness = has referral to ophtho for further eval

## 2016-10-29 ENCOUNTER — Other Ambulatory Visit: Payer: Self-pay | Admitting: Internal Medicine

## 2016-10-29 DIAGNOSIS — B20 Human immunodeficiency virus [HIV] disease: Secondary | ICD-10-CM

## 2016-11-23 ENCOUNTER — Other Ambulatory Visit (HOSPITAL_COMMUNITY)
Admission: RE | Admit: 2016-11-23 | Discharge: 2016-11-23 | Disposition: A | Discharge status: Home / Self Care | Payer: Medicare Other | Source: Ambulatory Visit | Attending: Infectious Diseases | Admitting: Infectious Diseases

## 2016-11-23 ENCOUNTER — Other Ambulatory Visit: Payer: Medicare Other

## 2016-11-23 DIAGNOSIS — Z113 Encounter for screening for infections with a predominantly sexual mode of transmission: Secondary | ICD-10-CM | POA: Insufficient documentation

## 2016-11-23 DIAGNOSIS — Z21 Asymptomatic human immunodeficiency virus [HIV] infection status: Secondary | ICD-10-CM

## 2016-11-23 DIAGNOSIS — B20 Human immunodeficiency virus [HIV] disease: Secondary | ICD-10-CM

## 2016-11-23 LAB — CBC
HCT: 37.2 % (ref 35.0–45.0)
Hemoglobin: 12.3 g/dL (ref 11.7–15.5)
MCH: 32.3 pg (ref 27.0–33.0)
MCHC: 33.1 g/dL (ref 32.0–36.0)
MCV: 97.6 fL (ref 80.0–100.0)
MPV: 10.5 fL (ref 7.5–12.5)
Platelets: 353 10*3/uL (ref 140–400)
RBC: 3.81 MIL/uL (ref 3.80–5.10)
RDW: 14 % (ref 11.0–15.0)
WBC: 9.1 10*3/uL (ref 3.8–10.8)

## 2016-11-23 LAB — COMPREHENSIVE METABOLIC PANEL
ALT: 14 U/L (ref 6–29)
AST: 14 U/L (ref 10–35)
Albumin: 4.2 g/dL (ref 3.6–5.1)
Alkaline Phosphatase: 68 U/L (ref 33–130)
BUN: 22 mg/dL (ref 7–25)
CO2: 23 mmol/L (ref 20–31)
Calcium: 10 mg/dL (ref 8.6–10.4)
Chloride: 103 mmol/L (ref 98–110)
Creat: 1.17 mg/dL — ABNORMAL HIGH (ref 0.50–1.05)
Glucose, Bld: 148 mg/dL — ABNORMAL HIGH (ref 65–99)
Potassium: 4.6 mmol/L (ref 3.5–5.3)
Sodium: 138 mmol/L (ref 135–146)
Total Bilirubin: 0.3 mg/dL (ref 0.2–1.2)
Total Protein: 7.8 g/dL (ref 6.1–8.1)

## 2016-11-24 LAB — T-HELPER CELL (CD4) - (RCID CLINIC ONLY)
CD4 % Helper T Cell: 19 % — ABNORMAL LOW (ref 33–55)
CD4 T Cell Abs: 550 /uL (ref 400–2700)

## 2016-11-24 LAB — URINE CYTOLOGY ANCILLARY ONLY
Chlamydia: NEGATIVE
Neisseria Gonorrhea: NEGATIVE

## 2016-11-25 LAB — HIV-1 RNA QUANT-NO REFLEX-BLD
HIV 1 RNA Quant: <20 copies/mL (ref <20)
HIV-1 RNA Quant, Log: <1.3 Log copies/mL (ref <1.30)

## 2016-11-28 ENCOUNTER — Other Ambulatory Visit: Payer: Self-pay | Admitting: Internal Medicine

## 2016-11-28 DIAGNOSIS — B20 Human immunodeficiency virus [HIV] disease: Secondary | ICD-10-CM

## 2016-12-07 ENCOUNTER — Encounter: Payer: Self-pay | Admitting: Internal Medicine

## 2016-12-07 ENCOUNTER — Telehealth: Payer: Self-pay | Admitting: Gynecology

## 2016-12-07 ENCOUNTER — Ambulatory Visit (INDEPENDENT_AMBULATORY_CARE_PROVIDER_SITE_OTHER): Payer: Medicare Other | Admitting: Internal Medicine

## 2016-12-07 VITALS — BP 117/74 | HR 92 | Temp 98.3°F | Ht 69.0 in | Wt 264.0 lb

## 2016-12-07 DIAGNOSIS — I10 Essential (primary) hypertension: Secondary | ICD-10-CM | POA: Diagnosis not present

## 2016-12-07 DIAGNOSIS — Z87412 Personal history of vulvar dysplasia: Secondary | ICD-10-CM | POA: Diagnosis not present

## 2016-12-07 DIAGNOSIS — Z21 Asymptomatic human immunodeficiency virus [HIV] infection status: Secondary | ICD-10-CM | POA: Diagnosis not present

## 2016-12-07 MED ORDER — MENINGOCOCCAL A C Y&W-135 OLIG IM SOLR
0.5000 mL | Freq: Once | INTRAMUSCULAR | Status: AC
  Administered 2016-12-07: 0.5 mL via INTRAMUSCULAR

## 2016-12-07 NOTE — Telephone Encounter (Signed)
Appt scheduled w/Clarke-Pearson on 12/29 at 815am for a f/u.

## 2016-12-07 NOTE — Patient Instructions (Signed)
Remember to get labs 2 wk prior to next visit

## 2016-12-07 NOTE — Progress Notes (Signed)
Rfv: hiv disease Patient ID: Katrina Wolfe, female   DOB: 07/28/1966, 50 y.o.   MRN: [id number]  HPI 50yo F with hiv disease, CD 4 count of 550/VL<20 on genvoya. She reports doing well with her adherence. She has also better control of her BS, keeping them below 200. She was to follow up with gyn-onc  With dr Carlis AbbottDianah Field for follow up on VIN III, but having difficult getting through to the clinic for appt that she was to have in sep 2017.  Outpatient Encounter Prescriptions as of 12/07/2016  Medication Sig  . atorvastatin (LIPITOR) 10 MG tablet Take 10 mg by mouth every morning.   . calcium carbonate (TUMS - DOSED IN MG ELEMENTAL CALCIUM) 500 MG chewable tablet Chew 1 tablet by mouth as needed for indigestion or heartburn.  Marland Kitchen CALCIUM PO Take 2 tablets by mouth daily.  . Cats Claw, Uncaria tomentosa, 500 MG CAPS Take 2 capsules by mouth every morning.   . cloNIDine (CATAPRES) 0.3 MG tablet Take 0.3 mg by mouth 2 (two) times daily.   . Coconut Oil 1000 MG CAPS Take 2 capsules by mouth daily.  . GENVOYA 150-150-200-10 MG TABS tablet TAKE 1 TABLET BY MOUTH DAILY WITH BREAKFAST  . HUMALOG MIX 75/25 KWIKPEN (75-25) 100 UNIT/ML Kwikpen Inject 50 Units into the skin 2 (two) times daily.   . hydrochlorothiazide (HYDRODIURIL) 25 MG tablet Take 1 tablet (25 mg total) by mouth daily.  . Insulin Syringe-Needle U-100 (INSULIN SYRINGE 1CC/31GX5/16") 31G X 5/16" 1 ML MISC 1 Units by Does not apply route 2 (two) times daily.  . Lancets (ONETOUCH ULTRASOFT) lancets   . lisinopril (PRINIVIL,ZESTRIL) 10 MG tablet Take 1 tablet (10 mg total) by mouth daily.  . metFORMIN (GLUCOPHAGE) 1000 MG tablet Take 1,000 mg by mouth 2 (two) times daily with a meal.   . ONE TOUCH ULTRA TEST test strip   . [DISCONTINUED] silver sulfADIAZINE (SILVADENE) 1 % cream Apply 1 application topically 2 (two) times daily. Apply to vulva twice daily   No facility-administered encounter medications on file as of 12/07/2016.       Patient Active Problem List   Diagnosis Date Noted  . HIV disease ([place]) 10/27/2015  . DM type 2 (diabetes mellitus, type 2) ([place]) 10/27/2015  . Hyperlipidemia 10/27/2015  . Essential hypertension 10/27/2015  . Severe vulvar dysplasia, histologically confirmed 10/15/2015     Health Maintenance Due  Topic Date Due  . HEMOGLOBIN A1C  Jul 30, 1966  . FOOT EXAM  08/16/1976  . OPHTHALMOLOGY EXAM  08/16/1976  . TETANUS/TDAP  08/16/1985  . COLONOSCOPY  08/16/2016     Review of Systems Review of Systems  Constitutional: Negative for fever, chills, diaphoresis, activity change, appetite change, fatigue and unexpected weight change.  HENT: Negative for congestion, sore throat, rhinorrhea, sneezing, trouble swallowing and sinus pressure.  Eyes: Negative for photophobia and visual disturbance.  Respiratory: Negative for cough, chest tightness, shortness of breath, wheezing and stridor.  Cardiovascular: Negative for chest pain, palpitations and leg swelling.  Gastrointestinal: Negative for nausea, vomiting, abdominal pain, diarrhea, constipation, blood in stool, abdominal distention and anal bleeding.  Genitourinary: Negative for dysuria, hematuria, flank pain and difficulty urinating.  Musculoskeletal: Negative for myalgias, back pain, joint swelling, arthralgias and gait problem.  Skin: Negative for color change, pallor, rash and wound.  Neurological: Negative for dizziness, tremors, weakness and light-headedness.  Hematological: Negative for adenopathy. Does not bruise/bleed easily.  Psychiatric/Behavioral: Negative for behavioral problems, confusion, sleep disturbance, dysphoric mood, decreased concentration  and agitation.    Physical Exam   BP 117/74   Pulse 92   Temp 98.3 F (36.8 C) (Oral)   Ht 5\' 9"  (1.753 m)   Wt 264 lb (119.7 kg)   LMP 07/07/2015 Comment: AUB  BMI 38.99 kg/m  Physical Exam  Constitutional:  oriented to person, place, and time. appears well-developed and  well-nourished. No distress.  HENT: [place]/AT, left eye blindness, no scleral icterus Mouth/Throat: Oropharynx is clear and moist. No oropharyngeal exudate.  Cardiovascular: Normal rate, regular rhythm and normal heart sounds. Exam reveals no gallop and no friction rub.  No murmur heard.  Pulmonary/Chest: Effort normal and breath sounds normal. No respiratory distress.  has no wheezes.  Neck = supple, no nuchal rigidity Abdominal: Soft. Bowel sounds are normal.  exhibits no distension. There is no tenderness.  Lymphadenopathy: no cervical adenopathy. No axillary adenopathy Neurological: alert and oriented to person, place, and time.  Skin: Skin is warm and dry. No rash noted. No erythema.  Psychiatric: a normal mood and affect.  behavior is normal.   Lab Results  Component Value Date   CD4TCELL 19 (L) 11/23/2016   Lab Results  Component Value Date   CD4TABS 550 11/23/2016   CD4TABS 520 08/17/2016   CD4TABS 330 (L) 03/07/2016   Lab Results  Component Value Date   HIV1RNAQUANT <20 11/23/2016   Lab Results  Component Value Date   HEPBSAB NEG 10/13/2015   No results found for: RPR  CBC Lab Results  Component Value Date   WBC 9.1 11/23/2016   RBC 3.81 11/23/2016   HGB 12.3 11/23/2016   HCT 37.2 11/23/2016   PLT 353 11/23/2016   MCV 97.6 11/23/2016   MCH 32.3 11/23/2016   MCHC 33.1 11/23/2016   RDW 14.0 11/23/2016   LYMPHSABS 2,860 08/17/2016   MONOABS 770 08/17/2016   EOSABS 660 (H) 08/17/2016   BASOSABS 0 08/17/2016   BMET Lab Results  Component Value Date   NA 138 11/23/2016   K 4.6 11/23/2016   CL 103 11/23/2016   CO2 23 11/23/2016   GLUCOSE 148 (H) 11/23/2016   BUN 22 11/23/2016   CREATININE 1.17 (H) 11/23/2016   CALCIUM 10.0 11/23/2016   GFRNONAA 67 08/17/2016   GFRAA 78 08/17/2016     Assessment and Plan hiv disease = well controlled, continue with genvoya  htn = no change in meds needed at this time well controlled  Health maintenance = will give  meningococcal vaccine  VIN III = have arranged follow up appt with dr Dianah Field clarke so that can make plans for next pap and surveillance studies

## 2016-12-15 ENCOUNTER — Ambulatory Visit: Payer: Medicare Other | Attending: Gynecology | Admitting: Gynecology

## 2016-12-15 ENCOUNTER — Encounter: Payer: Self-pay | Admitting: Gynecology

## 2016-12-15 VITALS — BP 137/77 | HR 65 | Temp 98.3°F | Resp 18 | Ht 69.0 in | Wt 267.1 lb

## 2016-12-15 DIAGNOSIS — Z833 Family history of diabetes mellitus: Secondary | ICD-10-CM | POA: Insufficient documentation

## 2016-12-15 DIAGNOSIS — Z91013 Allergy to seafood: Secondary | ICD-10-CM | POA: Insufficient documentation

## 2016-12-15 DIAGNOSIS — Z9889 Other specified postprocedural states: Secondary | ICD-10-CM | POA: Insufficient documentation

## 2016-12-15 DIAGNOSIS — Z809 Family history of malignant neoplasm, unspecified: Secondary | ICD-10-CM | POA: Insufficient documentation

## 2016-12-15 DIAGNOSIS — Z803 Family history of malignant neoplasm of breast: Secondary | ICD-10-CM | POA: Insufficient documentation

## 2016-12-15 DIAGNOSIS — Z86008 Personal history of in-situ neoplasm of other site: Secondary | ICD-10-CM

## 2016-12-15 DIAGNOSIS — H5462 Unqualified visual loss, left eye, normal vision right eye: Secondary | ICD-10-CM | POA: Diagnosis not present

## 2016-12-15 DIAGNOSIS — Z794 Long term (current) use of insulin: Secondary | ICD-10-CM | POA: Insufficient documentation

## 2016-12-15 DIAGNOSIS — D071 Carcinoma in situ of vulva: Secondary | ICD-10-CM | POA: Insufficient documentation

## 2016-12-15 DIAGNOSIS — I129 Hypertensive chronic kidney disease with stage 1 through stage 4 chronic kidney disease, or unspecified chronic kidney disease: Secondary | ICD-10-CM | POA: Diagnosis not present

## 2016-12-15 DIAGNOSIS — N182 Chronic kidney disease, stage 2 (mild): Secondary | ICD-10-CM | POA: Diagnosis not present

## 2016-12-15 DIAGNOSIS — Z8249 Family history of ischemic heart disease and other diseases of the circulatory system: Secondary | ICD-10-CM | POA: Insufficient documentation

## 2016-12-15 DIAGNOSIS — K219 Gastro-esophageal reflux disease without esophagitis: Secondary | ICD-10-CM | POA: Diagnosis not present

## 2016-12-15 DIAGNOSIS — E1122 Type 2 diabetes mellitus with diabetic chronic kidney disease: Secondary | ICD-10-CM | POA: Insufficient documentation

## 2016-12-15 DIAGNOSIS — Z882 Allergy status to sulfonamides status: Secondary | ICD-10-CM | POA: Diagnosis not present

## 2016-12-15 DIAGNOSIS — Z888 Allergy status to other drugs, medicaments and biological substances status: Secondary | ICD-10-CM | POA: Diagnosis not present

## 2016-12-15 NOTE — Progress Notes (Signed)
Consult Note: Gyn-Onc   Katrina Wolfe 50 y.o. female  No chief complaint on file.   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 No evidence of recurrent disease.  Plan:   The patient given information regarding symptoms associated with recurrent disease. She'll contact me she develops any new problems. Otherwise she return in 6 months.  Interval history: Patient underwent wide local excision of a verrocoid lesion of her mons on 01/07/2016. She also had extensive laser vaporization of vulvar VIN 3. Final pathology of the excisional biopsy of the mons showed VIN 3 with negative margins.   She has not noted any new lesions and has no symptoms. Specifically she is not having any pruritus. Otherwise her health has been good in the interim.. She denies any other pelvic symptoms bleeding or discharge. She denies any GI or GU symptoms.  HPI: 50 year old African American female seen in consultation the request of Dr.Varnado regarding management of recurrent VIN 3. Patient presented with lesions on the vulva and mons which been biopsied showing both with severe dysplasia. Patient reports that she had laser vaporization of the vulva in 2010 in [place] for similar condition. She is not on any immunosuppressive therapies. Pap smears in the past of a normal period the patient is not had a menstrual period since July 2016.  She underwent wide local excision of the lesion on her mons (VIN-III) as well as laser vaporization of several lesions of the vulva on 01/07/2016.  Review of Systems:10 point review of systems is negative except as noted in interval history.   Vitals: Blood pressure 137/77, pulse 65, temperature 98.3 F (36.8 C), temperature source Oral, resp. rate 18, height 5\' 9"  (1.753 m), weight 267 lb 1.6 oz (121.2 kg), last menstrual period 07/07/2015, SpO2 100 %.  Physical Exam: General : The patient is a healthy woman in no acute distress.  HEENT: normocephalic, extraoccular movements normal;  neck is supple without thyromegally  Lynphnodes: Supraclavicular and inguinal nodes not enlarged  Abdomen: Soft, non-tender, no ascites, no organomegally, no masses, no hernias  Pelvic:   Mons: The incision on the mons is healed. There are no new lesions... EGBUS: Normal female .   There are no lesions noted. Vagina: Normal, no lesions  Urethra and Bladder: Normal, non-tender  Cervix: Surgically absent  Uterus: Surgically absent  Bi-manual examination: Non-tender; no adenxal masses or nodularity  Rectal: normal sphincter tone, no masses, no blood , inspection the anus shows no lesions. Lower extremities: No edema or varicosities. Normal range of motion      Allergies  Allergen Reactions  . Shellfish Allergy Anaphylaxis    All types shellfish  . Bactrim [Sulfamethoxazole-Trimethoprim] Hives    Past Medical History:  Diagnosis Date  . Abnormal uterine bleeding (AUB)   . Arthritis    knees  . CKD (chronic kidney disease), stage II   . Congenital cardiomegaly    per pt has always been told since childhood and siblings   . GERD (gastroesophageal reflux disease)   . History of genital warts   . HIV (human immunodeficiency virus infection) ([place])    [place]-- DR Carlyle Basques  . Hypertension   . Intermittent palpitations    mild-- no meds  . Legally blind in left eye, as defined in [place]    trauma as child  . Type 2 diabetes mellitus ([place])   . Uterine fibroid   . VIN III (vulvar intraepithelial neoplasia III)  and Verrucoid lesion on the mons  . Wears glasses     Past Surgical History:  Procedure Laterality Date  . CESAREAN SECTION  2001  &  1984  . CO2 LASER APPLICATION N/A 29/08/2425   Procedure: CO2 LASER APPLICATION AND WIDE VOCAL EXCSION OF LESION ON MONS PUBIS;  Surgeon: Marti Sleigh, MD;  Location: [facility];  Service: Gynecology;  Laterality: N/A;   CASE CANCELLED   . CO2 LASER APPLICATION N/A 8/34/1962    Procedure: CO2 LASER OF VULVAR;  Surgeon: Marti Sleigh, MD;  Location: [facility];  Service: Gynecology;  Laterality: N/A;  . CORNEAL TRANSPLANT Left 2006   failed  . D & C HYSTEROSCOPY /  RESECTION FIBROID  2004  . KNEE ARTHROSCOPY W/ ACL RECONSTRUCTION Bilateral 2008  . VULVECTOMY N/A 01/07/2016   Procedure: WIDE LOCAL EXCISION;  Surgeon: Marti Sleigh, MD;  Location: [facility];  Service: Gynecology;  Laterality: N/A;  mons pubis as site    Current Outpatient Prescriptions  Medication Sig Dispense Refill  . atorvastatin (LIPITOR) 10 MG tablet Take 10 mg by mouth every morning.     . calcium carbonate (TUMS - DOSED IN MG ELEMENTAL CALCIUM) 500 MG chewable tablet Chew 1 tablet by mouth as needed for indigestion or heartburn.    Marland Kitchen CALCIUM PO Take 2 tablets by mouth daily.    . Cats Claw, Uncaria tomentosa, 500 MG CAPS Take 2 capsules by mouth every morning.     . cloNIDine (CATAPRES) 0.3 MG tablet Take 0.3 mg by mouth 2 (two) times daily.     . Coconut Oil 1000 MG CAPS Take 2 capsules by mouth daily.    . GENVOYA 150-150-200-10 MG TABS tablet TAKE 1 TABLET BY MOUTH DAILY WITH BREAKFAST 30 tablet 4  . HUMALOG MIX 75/25 KWIKPEN (75-25) 100 UNIT/ML Kwikpen Inject 50 Units into the skin 2 (two) times daily.     . hydrochlorothiazide (HYDRODIURIL) 25 MG tablet Take 1 tablet (25 mg total) by mouth daily. 30 tablet 0  . Insulin Syringe-Needle U-100 (INSULIN SYRINGE 1CC/31GX5/16") 31G X 5/16" 1 ML MISC 1 Units by Does not apply route 2 (two) times daily. 30 each 0  . Lancets (ONETOUCH ULTRASOFT) lancets     . lisinopril (PRINIVIL,ZESTRIL) 10 MG tablet Take 1 tablet (10 mg total) by mouth daily. 30 tablet 0  . metFORMIN (GLUCOPHAGE) 1000 MG tablet Take 1,000 mg by mouth 2 (two) times daily with a meal.     . ONE TOUCH ULTRA TEST test strip      No current facility-administered medications for this visit.     Social History   Social History  .  Marital status: Married    Spouse name: N/A  . Number of children: N/A  . Years of education: N/A   Occupational History  . Not on file.   Social History Main Topics  . Smoking status: Never Smoker  . Smokeless tobacco: Never Used  . Alcohol use No  . Drug use: No  . Sexual activity: Yes    Birth control/ protection: None   Other Topics Concern  . Not on file   Social History Narrative  . No narrative on file    Family History  Problem Relation Age of Onset  . Hypertension Mother   . Diabetes Father   . Cancer Brother   . Breast cancer Cousin       Marti Sleigh, MD 12/15/2016, 8:21 AM

## 2016-12-15 NOTE — Patient Instructions (Signed)
Return to see [place] in 6 months.

## 2017-05-06 ENCOUNTER — Other Ambulatory Visit: Payer: Self-pay | Admitting: Internal Medicine

## 2017-05-06 DIAGNOSIS — B20 Human immunodeficiency virus [HIV] disease: Secondary | ICD-10-CM

## 2017-05-17 ENCOUNTER — Telehealth: Payer: Self-pay | Admitting: *Deleted

## 2017-05-17 NOTE — Telephone Encounter (Signed)
Received fax from [facility] noting potential gap in care. Patient is diabetic and not on a statin. Patient's chart shows atorvastatin 10mg  as historic medication, this is not an active prescription. Please advise if you would like to restart this. Notice also received that patient is diabetic and diagnosed with hypertension They note she is not on an ACE, ARB, thaizide diuretic, or ccb. These were last prescribed 2016. Patient is following up on 6/21. Notice placed in MD box. Landis Gandy, RN

## 2017-05-18 ENCOUNTER — Other Ambulatory Visit: Payer: Medicare Other

## 2017-05-18 ENCOUNTER — Encounter: Payer: Self-pay | Admitting: Gynecology

## 2017-05-18 ENCOUNTER — Ambulatory Visit: Payer: Medicare Other | Attending: Gynecology | Admitting: Gynecology

## 2017-05-18 VITALS — BP 151/77 | HR 64 | Temp 98.1°F | Resp 18 | Wt 275.9 lb

## 2017-05-18 DIAGNOSIS — Z803 Family history of malignant neoplasm of breast: Secondary | ICD-10-CM | POA: Insufficient documentation

## 2017-05-18 DIAGNOSIS — Z8249 Family history of ischemic heart disease and other diseases of the circulatory system: Secondary | ICD-10-CM | POA: Insufficient documentation

## 2017-05-18 DIAGNOSIS — Z7984 Long term (current) use of oral hypoglycemic drugs: Secondary | ICD-10-CM | POA: Diagnosis not present

## 2017-05-18 DIAGNOSIS — Z86008 Personal history of in-situ neoplasm of other site: Secondary | ICD-10-CM | POA: Diagnosis present

## 2017-05-18 DIAGNOSIS — Z91013 Allergy to seafood: Secondary | ICD-10-CM | POA: Diagnosis not present

## 2017-05-18 DIAGNOSIS — Z833 Family history of diabetes mellitus: Secondary | ICD-10-CM | POA: Diagnosis not present

## 2017-05-18 DIAGNOSIS — K219 Gastro-esophageal reflux disease without esophagitis: Secondary | ICD-10-CM | POA: Diagnosis not present

## 2017-05-18 DIAGNOSIS — I129 Hypertensive chronic kidney disease with stage 1 through stage 4 chronic kidney disease, or unspecified chronic kidney disease: Secondary | ICD-10-CM | POA: Insufficient documentation

## 2017-05-18 DIAGNOSIS — Z882 Allergy status to sulfonamides status: Secondary | ICD-10-CM | POA: Insufficient documentation

## 2017-05-18 DIAGNOSIS — E1122 Type 2 diabetes mellitus with diabetic chronic kidney disease: Secondary | ICD-10-CM | POA: Insufficient documentation

## 2017-05-18 DIAGNOSIS — D071 Carcinoma in situ of vulva: Secondary | ICD-10-CM

## 2017-05-18 DIAGNOSIS — N182 Chronic kidney disease, stage 2 (mild): Secondary | ICD-10-CM | POA: Diagnosis not present

## 2017-05-18 DIAGNOSIS — H5462 Unqualified visual loss, left eye, normal vision right eye: Secondary | ICD-10-CM | POA: Insufficient documentation

## 2017-05-18 DIAGNOSIS — Z9889 Other specified postprocedural states: Secondary | ICD-10-CM | POA: Insufficient documentation

## 2017-05-18 DIAGNOSIS — B20 Human immunodeficiency virus [HIV] disease: Secondary | ICD-10-CM | POA: Diagnosis not present

## 2017-05-18 DIAGNOSIS — Z79899 Other long term (current) drug therapy: Secondary | ICD-10-CM | POA: Diagnosis not present

## 2017-05-18 NOTE — Progress Notes (Signed)
Consult Note: Gyn-Onc   Katrina Wolfe 51 y.o. female  Chief Complaint  Patient presents with  . 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 No evidence of recurrent disease.  Plan:   The patient given information regarding symptoms associated with recurrent disease. She'll contact me she develops any new problems. Otherwise she return in 6 months.  Interval history: Patient underwent wide local excision of a verrocoid lesion of her mons on 01/07/2016. She also had extensive laser vaporization of vulvar VIN 3. Final pathology of the excisional biopsy of the mons showed VIN 3 with negative margins.   She has not noted any new lesions and has no symptoms. Specifically she is not having any pruritus. Otherwise her health has been good in the interim.. She denies any other pelvic symptoms bleeding or discharge. She denies any GI or GU symptoms.  HPI: 51 year old African American female seen in consultation the request of Dr.Varnado regarding management of recurrent VIN 3. Patient presented with lesions on the vulva and mons which been biopsied showing both with severe dysplasia. Patient reports that she had laser vaporization of the vulva in 2010 in [place] for similar condition. She is not on any immunosuppressive therapies. Pap smears in the past of a normal period the patient is not had a menstrual period since July 2016.  She underwent wide local excision of the lesion on her mons (VIN-III) as well as laser vaporization of several lesions of the vulva on 01/07/2016.  Review of Systems:10 point review of systems is negative except as noted in interval history.   Vitals: Blood pressure (!) 151/77, pulse 64, temperature 98.1 F (36.7 C), resp. rate 18, weight 275 lb 14.4 oz (125.1 kg), last menstrual period 07/07/2015.  Physical Exam: General : The patient is a healthy woman in no acute distress.  HEENT: normocephalic, extraoccular movements normal; neck is supple without  thyromegally  Lynphnodes: Supraclavicular and inguinal nodes not enlarged  Abdomen: Soft, non-tender, no ascites, no organomegally, no masses, no hernias  Pelvic:   Mons. There are no new lesions... EGBUS: Normal female .   There are no lesions noted.   Lower extremities: No edema or varicosities. Normal range of motion      Allergies  Allergen Reactions  . Shellfish Allergy Anaphylaxis    All types shellfish  . Bactrim [Sulfamethoxazole-Trimethoprim] Hives    Past Medical History:  Diagnosis Date  . Abnormal uterine bleeding (AUB)   . Arthritis    knees  . CKD (chronic kidney disease), stage II   . Congenital cardiomegaly    per pt has always been told since childhood and siblings   . GERD (gastroesophageal reflux disease)   . History of genital warts   . HIV (human immunodeficiency virus infection) ([place])    [place]-- DR Carlyle Basques  . Hypertension   . Intermittent palpitations    mild-- no meds  . Legally blind in left eye, as defined in [place]    trauma as child  . Type 2 diabetes mellitus ([place])   . Uterine fibroid   . VIN III (vulvar intraepithelial neoplasia III)    and Verrucoid lesion on the mons  . Wears glasses     Past Surgical History:  Procedure Laterality Date  . CESAREAN SECTION  2001  &  1984  . CO2 LASER APPLICATION N/A 59/12/6382   Procedure: CO2 LASER APPLICATION AND WIDE VOCAL EXCSION OF LESION ON  MONS PUBIS;  Surgeon: Marti Sleigh, MD;  Location: [facility];  Service: Gynecology;  Laterality: N/A;   CASE CANCELLED   . CO2 LASER APPLICATION N/A 01/09/4824   Procedure: CO2 LASER OF VULVAR;  Surgeon: Marti Sleigh, MD;  Location: [facility];  Service: Gynecology;  Laterality: N/A;  . CORNEAL TRANSPLANT Left 2006   failed  . D & C HYSTEROSCOPY /  RESECTION FIBROID  2004  . KNEE ARTHROSCOPY W/ ACL RECONSTRUCTION Bilateral 2008  . VULVECTOMY N/A 01/07/2016    Procedure: WIDE LOCAL EXCISION;  Surgeon: Marti Sleigh, MD;  Location: [facility];  Service: Gynecology;  Laterality: N/A;  mons pubis as site    Current Outpatient Prescriptions  Medication Sig Dispense Refill  . atorvastatin (LIPITOR) 10 MG tablet Take 10 mg by mouth every morning.     . calcium carbonate (TUMS - DOSED IN MG ELEMENTAL CALCIUM) 500 MG chewable tablet Chew 1 tablet by mouth as needed for indigestion or heartburn.    Marland Kitchen CALCIUM PO Take 2 tablets by mouth daily.    . Cats Claw, Uncaria tomentosa, 500 MG CAPS Take 2 capsules by mouth every morning.     . cloNIDine (CATAPRES) 0.3 MG tablet Take 0.3 mg by mouth 2 (two) times daily.     . Coconut Oil 1000 MG CAPS Take 2 capsules by mouth daily.    . GENVOYA 150-150-200-10 MG TABS tablet TAKE 1 TABLET BY MOUTH DAILY WITH BREAKFAST 30 tablet 1  . HUMALOG MIX 75/25 KWIKPEN (75-25) 100 UNIT/ML Kwikpen Inject 50 Units into the skin 2 (two) times daily.     . hydrochlorothiazide (HYDRODIURIL) 25 MG tablet Take 1 tablet (25 mg total) by mouth daily. 30 tablet 0  . Insulin Syringe-Needle U-100 (INSULIN SYRINGE 1CC/31GX5/16") 31G X 5/16" 1 ML MISC 1 Units by Does not apply route 2 (two) times daily. 30 each 0  . Lancets (ONETOUCH ULTRASOFT) lancets     . lisinopril (PRINIVIL,ZESTRIL) 10 MG tablet Take 1 tablet (10 mg total) by mouth daily. 30 tablet 0  . metFORMIN (GLUCOPHAGE) 1000 MG tablet Take 1,000 mg by mouth 2 (two) times daily with a meal.     . ONE TOUCH ULTRA TEST test strip      No current facility-administered medications for this visit.     Social History   Social History  . Marital status: Married    Spouse name: N/A  . Number of children: N/A  . Years of education: N/A   Occupational History  . Not on file.   Social History Main Topics  . Smoking status: Never Smoker  . Smokeless tobacco: Never Used  . Alcohol use Not on file  . Drug use: Unknown  . Sexual activity: Yes    Birth control/  protection: None   Other Topics Concern  . Not on file   Social History Narrative  . No narrative on file    Family History  Problem Relation Age of Onset  . Hypertension Mother   . Diabetes Father   . Cancer Brother   . Breast cancer Cousin       Marti Sleigh, MD 05/18/2017, 8:36 AM

## 2017-05-18 NOTE — Patient Instructions (Signed)
Plan to follow up in six months or sooner.  Call for any new symptoms.

## 2017-05-24 ENCOUNTER — Other Ambulatory Visit: Payer: Medicare Other

## 2017-05-24 DIAGNOSIS — B20 Human immunodeficiency virus [HIV] disease: Secondary | ICD-10-CM

## 2017-05-24 LAB — CBC WITH DIFFERENTIAL/PLATELET
Basophils Absolute: 0 cells/uL (ref 0–200)
Basophils Relative: 0 %
Eosinophils Absolute: 240 cells/uL (ref 15–500)
Eosinophils Relative: 2 %
HCT: 35.4 % (ref 35.0–45.0)
Hemoglobin: 12 g/dL (ref 11.7–15.5)
Lymphocytes Relative: 21 %
Lymphs Abs: 2520 cells/uL (ref 850–3900)
MCH: 32.4 pg (ref 27.0–33.0)
MCHC: 33.9 g/dL (ref 32.0–36.0)
MCV: 95.7 fL (ref 80.0–100.0)
MPV: 10.2 fL (ref 7.5–12.5)
Monocytes Absolute: 840 cells/uL (ref 200–950)
Monocytes Relative: 7 %
Neutro Abs: 8400 cells/uL — ABNORMAL HIGH (ref 1500–7800)
Neutrophils Relative %: 70 %
Platelets: 354 10*3/uL (ref 140–400)
RBC: 3.7 MIL/uL — ABNORMAL LOW (ref 3.80–5.10)
RDW: 14.4 % (ref 11.0–15.0)
WBC: 12 10*3/uL — ABNORMAL HIGH (ref 3.8–10.8)

## 2017-05-24 LAB — COMPLETE METABOLIC PANEL WITH GFR
ALT: 11 U/L (ref 6–29)
AST: 11 U/L (ref 10–35)
Albumin: 3.5 g/dL — ABNORMAL LOW (ref 3.6–5.1)
Alkaline Phosphatase: 71 U/L (ref 33–130)
BUN: 10 mg/dL (ref 7–25)
CO2: 23 mmol/L (ref 20–31)
Calcium: 9.4 mg/dL (ref 8.6–10.4)
Chloride: 106 mmol/L (ref 98–110)
Creat: 1.05 mg/dL (ref 0.50–1.05)
GFR, Est African American: 72 mL/min (ref >=60)
GFR, Est Non African American: 62 mL/min (ref >=60)
Glucose, Bld: 137 mg/dL — ABNORMAL HIGH (ref 65–99)
Potassium: 4.2 mmol/L (ref 3.5–5.3)
Sodium: 140 mmol/L (ref 135–146)
Total Bilirubin: 0.3 mg/dL (ref 0.2–1.2)
Total Protein: 7.1 g/dL (ref 6.1–8.1)

## 2017-05-25 LAB — T-HELPER CELL (CD4) - (RCID CLINIC ONLY)
CD4 % Helper T Cell: 20 % — ABNORMAL LOW (ref 33–55)
CD4 T Cell Abs: 480 /uL (ref 400–2700)

## 2017-05-29 LAB — HIV-1 RNA QUANT-NO REFLEX-BLD
HIV 1 RNA Quant: <20 copies/mL
HIV-1 RNA Quant, Log: <1.3 Log copies/mL

## 2017-06-07 ENCOUNTER — Ambulatory Visit (INDEPENDENT_AMBULATORY_CARE_PROVIDER_SITE_OTHER): Payer: Medicare Other | Admitting: Internal Medicine

## 2017-06-07 VITALS — BP 175/92 | HR 79 | Temp 97.6°F | Ht 69.0 in | Wt 274.0 lb

## 2017-06-07 DIAGNOSIS — Z87412 Personal history of vulvar dysplasia: Secondary | ICD-10-CM

## 2017-06-07 DIAGNOSIS — J301 Allergic rhinitis due to pollen: Secondary | ICD-10-CM | POA: Diagnosis not present

## 2017-06-07 DIAGNOSIS — I10 Essential (primary) hypertension: Secondary | ICD-10-CM

## 2017-06-07 DIAGNOSIS — Z21 Asymptomatic human immunodeficiency virus [HIV] infection status: Secondary | ICD-10-CM

## 2017-06-07 NOTE — Progress Notes (Signed)
RFV: follow up for HIV disease  Patient ID: Katrina Wolfe, female   DOB: 30-Nov-1966, 51 y.o.   MRN: [id number]  HPI Katrina Wolfe is a 51yo F with hiv disease, Cd 4 count of 480/VL<20, June 2018, on genvoya. Overall well controlled. Also hx of VIN 3 saw ob/gyn beginning of June Who was reassured with exam, - has follow up in June. The patient denies any other issues. She states that she has been taking her BP meds at night, vitamins in the morning  But had headache yesterday that improved after taking her BP meds. No headache presently -- though BP is 150-170s. No recent illnesses other than nasal congestion which She thinks is due to seasonal allergies  Outpatient Encounter Prescriptions as of 06/07/2017  Medication Sig  . calcium carbonate (TUMS - DOSED IN MG ELEMENTAL CALCIUM) 500 MG chewable tablet Chew 1 tablet by mouth as needed for indigestion or heartburn.  . Cats Claw, Uncaria tomentosa, 500 MG CAPS Take 2 capsules by mouth every morning.   . cloNIDine (CATAPRES) 0.3 MG tablet Take 0.3 mg by mouth 2 (two) times daily.   . Coconut Oil 1000 MG CAPS Take 2 capsules by mouth daily.  . GENVOYA 150-150-200-10 MG TABS tablet TAKE 1 TABLET BY MOUTH DAILY WITH BREAKFAST  . HUMALOG MIX 75/25 KWIKPEN (75-25) 100 UNIT/ML Kwikpen Inject 50 Units into the skin 2 (two) times daily.   . hydrochlorothiazide (HYDRODIURIL) 25 MG tablet Take 1 tablet (25 mg total) by mouth daily.  . Insulin Syringe-Needle U-100 (INSULIN SYRINGE 1CC/31GX5/16") 31G X 5/16" 1 ML MISC 1 Units by Does not apply route 2 (two) times daily.  . Lancets (ONETOUCH ULTRASOFT) lancets   . metFORMIN (GLUCOPHAGE) 1000 MG tablet Take 1,000 mg by mouth 2 (two) times daily with a meal.   . ONE TOUCH ULTRA TEST test strip   . atorvastatin (LIPITOR) 10 MG tablet Take 10 mg by mouth every morning.   Marland Kitchen CALCIUM PO Take 2 tablets by mouth daily.  Marland Kitchen lisinopril (PRINIVIL,ZESTRIL) 10 MG tablet Take 1 tablet (10 mg total) by mouth daily. (Patient  not taking: Reported on 06/07/2017)   No facility-administered encounter medications on file as of 06/07/2017.      Patient Active Problem List   Diagnosis Date Noted  . HIV disease ([place]) 10/27/2015  . DM type 2 (diabetes mellitus, type 2) ([place]) 10/27/2015  . Hyperlipidemia 10/27/2015  . Essential hypertension 10/27/2015  . Severe vulvar dysplasia, histologically confirmed 10/15/2015     Health Maintenance Due  Topic Date Due  . HEMOGLOBIN A1C  11/28/1966  . FOOT EXAM  08/16/1976  . OPHTHALMOLOGY EXAM  08/16/1976  . TETANUS/TDAP  08/16/1985  . COLONOSCOPY  08/16/2016    Social History  Substance Use Topics  . Smoking status: Never Smoker  . Smokeless tobacco: Never Used  . Alcohol use Not on file   Review of Systems Per hpi, otherwise 12 point ros is negative Physical Exam   BP (!) 175/92   Pulse 79   Temp 97.6 F (36.4 C) (Oral)   Ht 5\' 9"  (1.753 m)   Wt 274 lb (124.3 kg)   LMP 07/07/2015 (Exact Date)   BMI 40.46 kg/m   Physical Exam  Constitutional:  oriented to person, place, and time. appears well-developed and well-nourished. No distress.  HENT: [place]/AT, PERRLA, no scleral icterus Mouth/Throat: Oropharynx is clear and moist. No oropharyngeal exudate.  Cardiovascular: Normal rate, regular rhythm and normal heart sounds. Exam reveals no gallop and  no friction rub.  No murmur heard.  Pulmonary/Chest: Effort normal and breath sounds normal. No respiratory distress.  has no wheezes.  Neck = supple, no nuchal rigidity Lymphadenopathy: no cervical adenopathy. No axillary adenopathy Neurological: alert and oriented to person, place, and time.  Skin: Skin is warm and dry. No rash noted. No erythema.  Psychiatric: a normal mood and affect.  behavior is normal.   Lab Results  Component Value Date   CD4TCELL 20 (L) 05/24/2017   Lab Results  Component Value Date   CD4TABS 480 05/24/2017   CD4TABS 550 11/23/2016   CD4TABS 520 08/17/2016   Lab Results  Component  Value Date   HIV1RNAQUANT <20 NOT DETECTED 05/24/2017   Lab Results  Component Value Date   HEPBSAB NEG 10/13/2015   Lab Results  Component Value Date   LABRPR NON REAC 08/17/2016    CBC Lab Results  Component Value Date   WBC 12.0 (H) 05/24/2017   RBC 3.70 (L) 05/24/2017   HGB 12.0 05/24/2017   HCT 35.4 05/24/2017   PLT 354 05/24/2017   MCV 95.7 05/24/2017   MCH 32.4 05/24/2017   MCHC 33.9 05/24/2017   RDW 14.4 05/24/2017   LYMPHSABS 2,520 05/24/2017   MONOABS 840 05/24/2017   EOSABS 240 05/24/2017    BMET Lab Results  Component Value Date   NA 140 05/24/2017   K 4.2 05/24/2017   CL 106 05/24/2017   CO2 23 05/24/2017   GLUCOSE 137 (H) 05/24/2017   BUN 10 05/24/2017   CREATININE 1.05 05/24/2017   CALCIUM 9.4 05/24/2017   GFRNONAA 62 05/24/2017   GFRAA 72 05/24/2017      Assessment and Plan  hiv disease = well controlled  HTN = poorly controlled at this visit, but unclear if she took her  BP meds this morning. Recommend to take meds in the am. She has follow up with dr Lorenda Hatchet next week for follow up on BP.   Seasonal allergies = reocmmend to use zyrtec instead of sudafed (which can also increase her BP)  Health maintenance = had colonoscopy at 61 yr to rule out cancer associated with VIN 3 which was negative. mammo next month.flu vac in the fall  VIN 3= in June had good report has follow up in sept

## 2017-06-11 ENCOUNTER — Other Ambulatory Visit: Payer: Self-pay | Admitting: Internal Medicine

## 2017-06-11 DIAGNOSIS — Z1231 Encounter for screening mammogram for malignant neoplasm of breast: Secondary | ICD-10-CM

## 2017-06-25 ENCOUNTER — Ambulatory Visit
Admission: RE | Admit: 2017-06-25 | Discharge: 2017-06-25 | Disposition: A | Discharge status: Home / Self Care | Payer: Medicare Other | Source: Ambulatory Visit | Attending: Internal Medicine | Admitting: Internal Medicine

## 2017-06-25 DIAGNOSIS — Z1231 Encounter for screening mammogram for malignant neoplasm of breast: Secondary | ICD-10-CM

## 2017-06-25 IMAGING — MG 2D DIGITAL SCREENING BILATERAL MAMMOGRAM WITH CAD AND ADJUNCT TO
8 of 12 series · 8 of 28 positions shown · non-contrast
Comparison: Previous exam(s).

CLINICAL DATA: Screening.

EXAM:
2D DIGITAL SCREENING BILATERAL MAMMOGRAM WITH CAD AND ADJUNCT TOMO

[R MLO synth-2D]
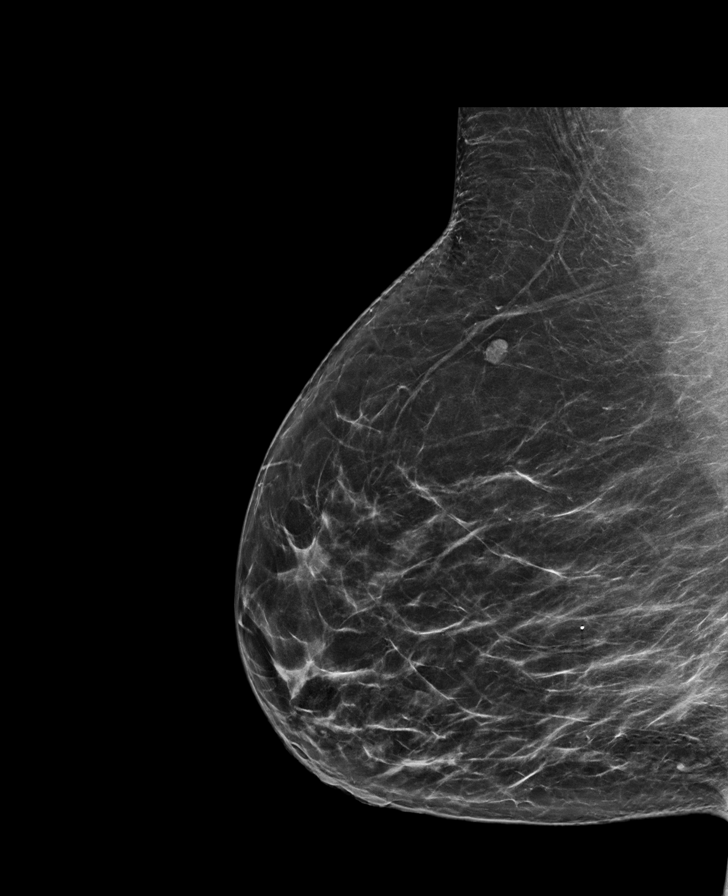

[L CC]
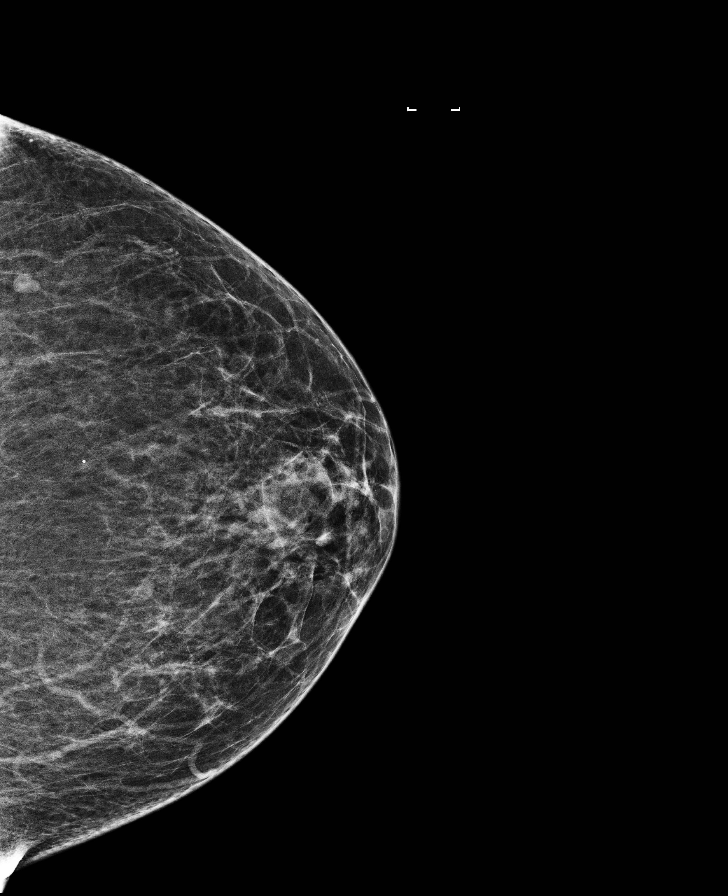

[R CC]
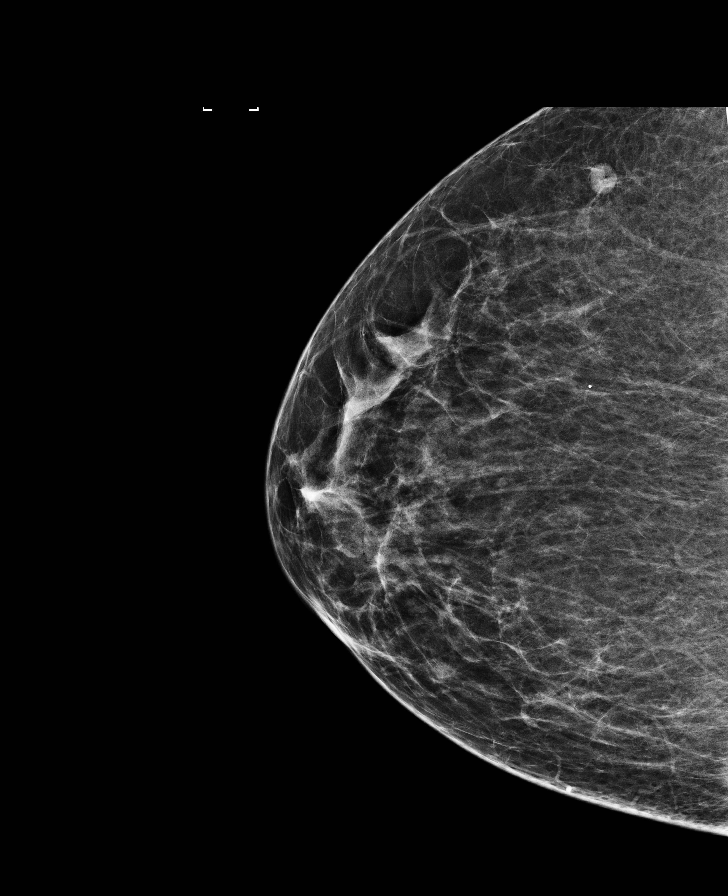

[R MLO]
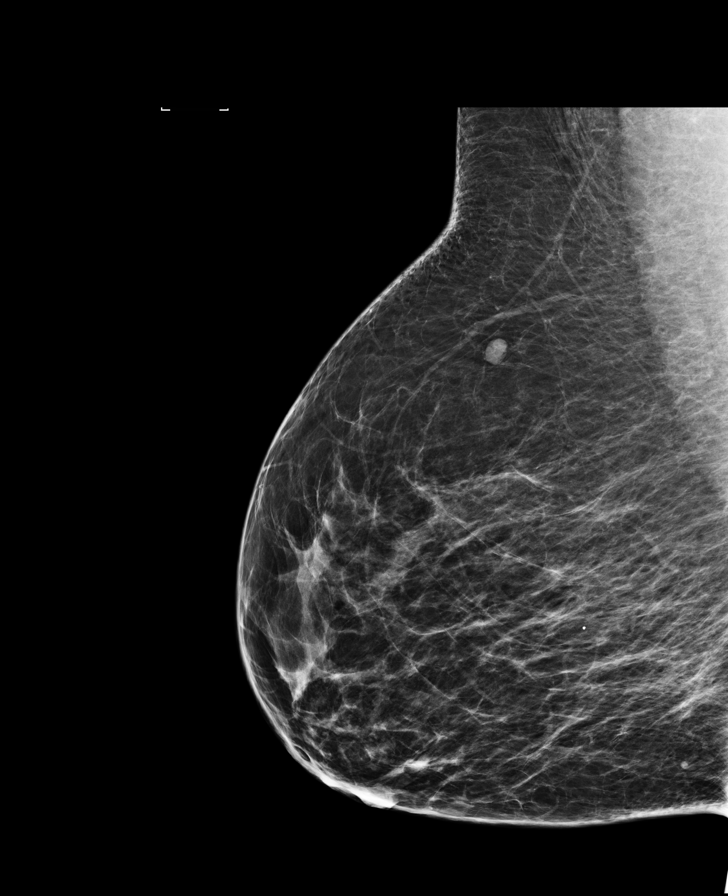

[L CC synth-2D]
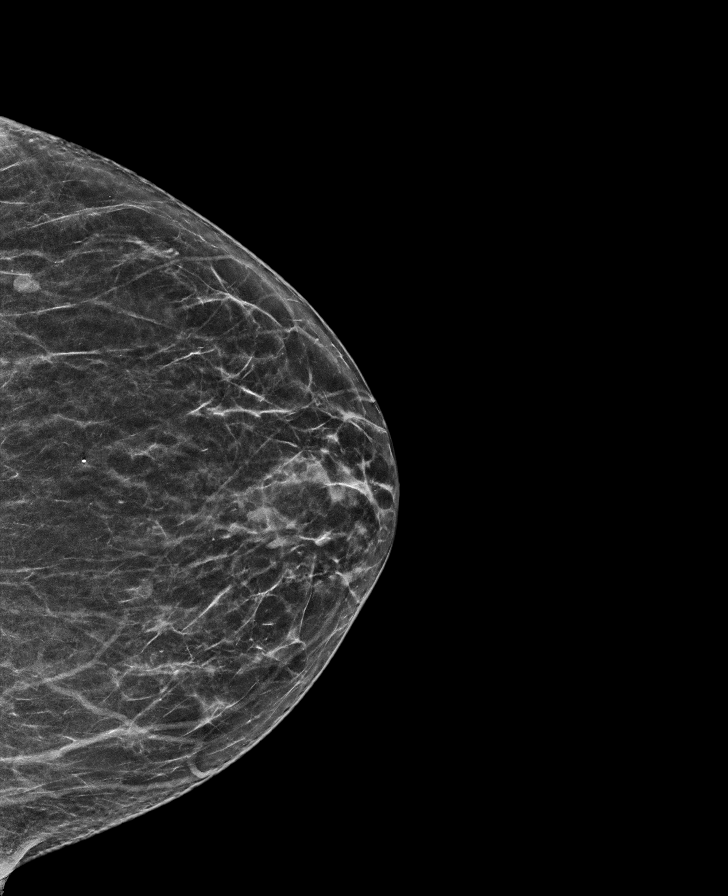

[L MLO synth-2D]
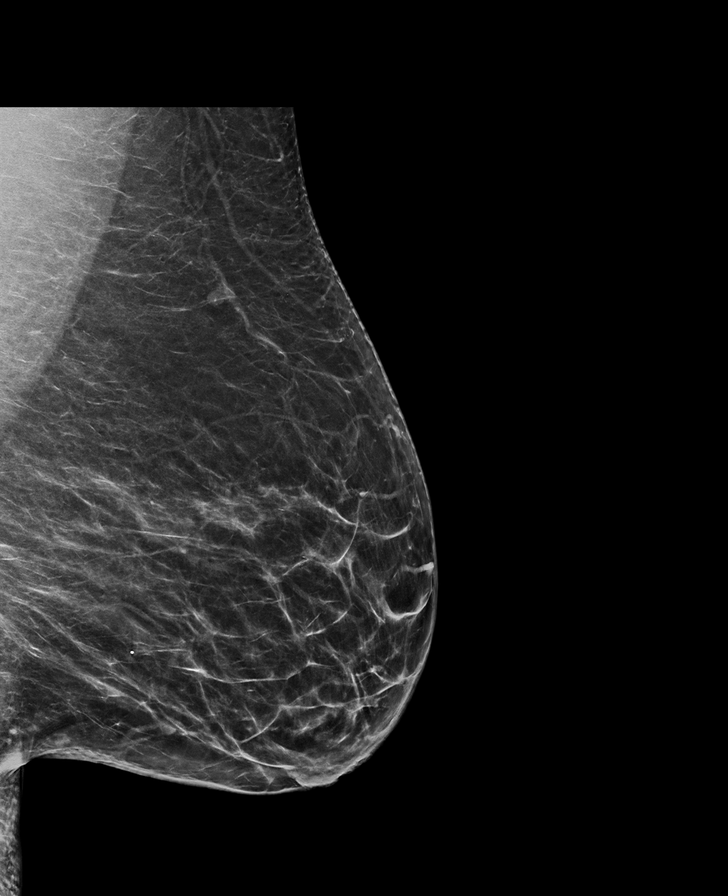

[R CC synth-2D]
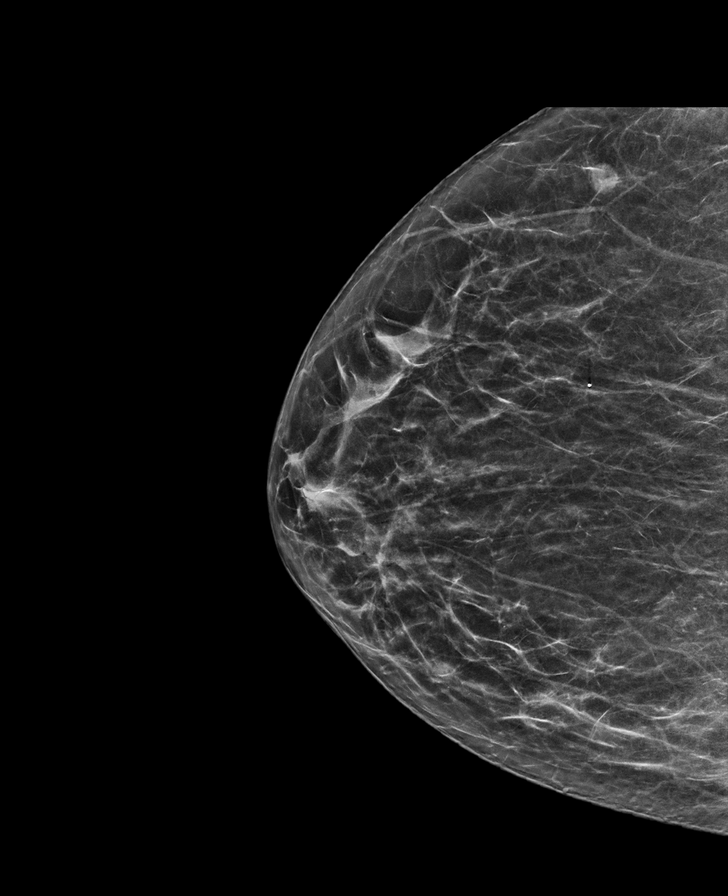

[L MLO]
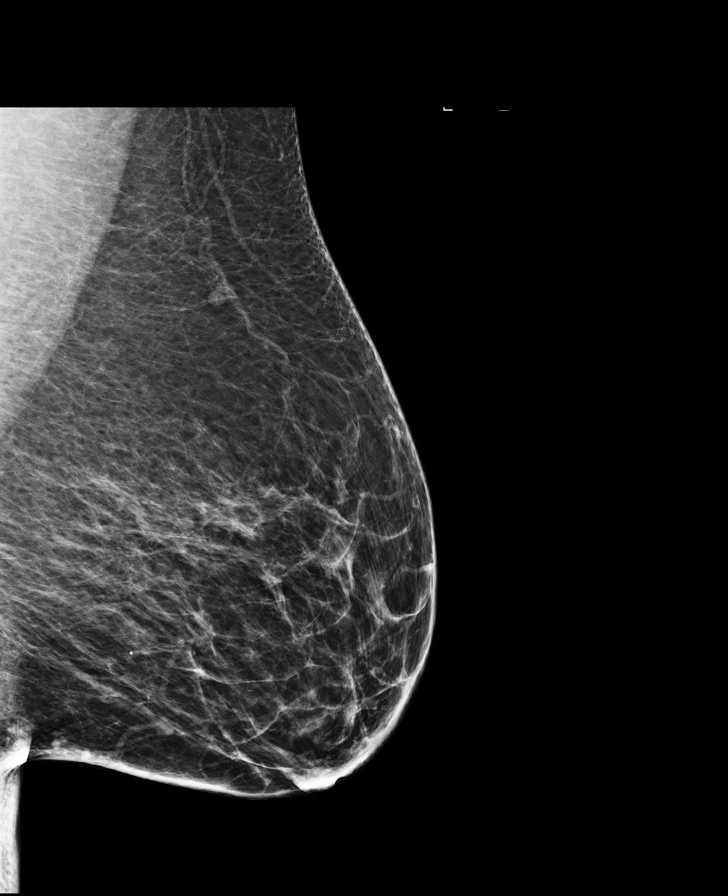

[8 of 28 positions shown; findings below may reference images not displayed]

ACR Breast Density Category b: There are scattered areas of
fibroglandular density.
FINDINGS: In the left breast, a possible mass warrants further evaluation. In
the right breast, no findings suspicious for malignancy.

Images were processed with CAD.
IMPRESSION: Further evaluation is suggested for possible mass in the left
breast.

RECOMMENDATION:
Diagnostic mammogram and possibly ultrasound of the left breast.
(Code:[WX])

The patient will be contacted regarding the findings, and additional
imaging will be scheduled.

BI-RADS CATEGORY  0: Incomplete. Need additional imaging evaluation
and/or prior mammograms for comparison.

## 2017-06-26 ENCOUNTER — Other Ambulatory Visit: Payer: Self-pay | Admitting: Internal Medicine

## 2017-06-26 DIAGNOSIS — R928 Other abnormal and inconclusive findings on diagnostic imaging of breast: Secondary | ICD-10-CM

## 2017-06-30 ENCOUNTER — Emergency Department (HOSPITAL_COMMUNITY)
Admission: EM | Admit: 2017-06-30 | Discharge: 2017-06-30 | Disposition: A | Discharge status: Home / Self Care | Payer: Medicare Other | Attending: Emergency Medicine | Admitting: Emergency Medicine

## 2017-06-30 ENCOUNTER — Emergency Department (HOSPITAL_COMMUNITY): Payer: Medicare Other

## 2017-06-30 ENCOUNTER — Encounter (HOSPITAL_COMMUNITY): Payer: Self-pay | Admitting: *Deleted

## 2017-06-30 ENCOUNTER — Emergency Department (HOSPITAL_BASED_OUTPATIENT_CLINIC_OR_DEPARTMENT_OTHER)
Admission: EM | Admit: 2017-06-30 | Discharge: 2017-06-30 | Disposition: A | Discharge status: Home / Self Care | Payer: Medicare Other | Source: Home / Self Care

## 2017-06-30 DIAGNOSIS — M7989 Other specified soft tissue disorders: Secondary | ICD-10-CM | POA: Diagnosis not present

## 2017-06-30 DIAGNOSIS — Y92000 Kitchen of unspecified non-institutional (private) residence as  the place of occurrence of the external cause: Secondary | ICD-10-CM | POA: Insufficient documentation

## 2017-06-30 DIAGNOSIS — Y999 Unspecified external cause status: Secondary | ICD-10-CM | POA: Diagnosis not present

## 2017-06-30 DIAGNOSIS — Y9301 Activity, walking, marching and hiking: Secondary | ICD-10-CM | POA: Insufficient documentation

## 2017-06-30 DIAGNOSIS — S99922A Unspecified injury of left foot, initial encounter: Secondary | ICD-10-CM | POA: Diagnosis present

## 2017-06-30 DIAGNOSIS — N182 Chronic kidney disease, stage 2 (mild): Secondary | ICD-10-CM | POA: Diagnosis not present

## 2017-06-30 DIAGNOSIS — I129 Hypertensive chronic kidney disease with stage 1 through stage 4 chronic kidney disease, or unspecified chronic kidney disease: Secondary | ICD-10-CM | POA: Diagnosis not present

## 2017-06-30 DIAGNOSIS — Z79899 Other long term (current) drug therapy: Secondary | ICD-10-CM | POA: Insufficient documentation

## 2017-06-30 DIAGNOSIS — S92352A Displaced fracture of fifth metatarsal bone, left foot, initial encounter for closed fracture: Secondary | ICD-10-CM | POA: Diagnosis not present

## 2017-06-30 DIAGNOSIS — Z794 Long term (current) use of insulin: Secondary | ICD-10-CM | POA: Insufficient documentation

## 2017-06-30 DIAGNOSIS — E1122 Type 2 diabetes mellitus with diabetic chronic kidney disease: Secondary | ICD-10-CM | POA: Insufficient documentation

## 2017-06-30 DIAGNOSIS — W228XXA Striking against or struck by other objects, initial encounter: Secondary | ICD-10-CM | POA: Diagnosis not present

## 2017-06-30 IMAGING — CR DG ANKLE COMPLETE 3+V*L*
3 series · 3 of 3 positions shown · non-contrast
Comparison: None

CLINICAL DATA: Took a step in her kitchen a week ago and heard
something crack, developed foot and ankle swelling, pain for 1 week,
initially got better for 3 days but now with worsening symptoms,
history hypertension, HIV, diabetes mellitus, stage II chronic
kidney disease

EXAM:
LEFT ANKLE COMPLETE - 3+ VIEW

[x ankle ap left]
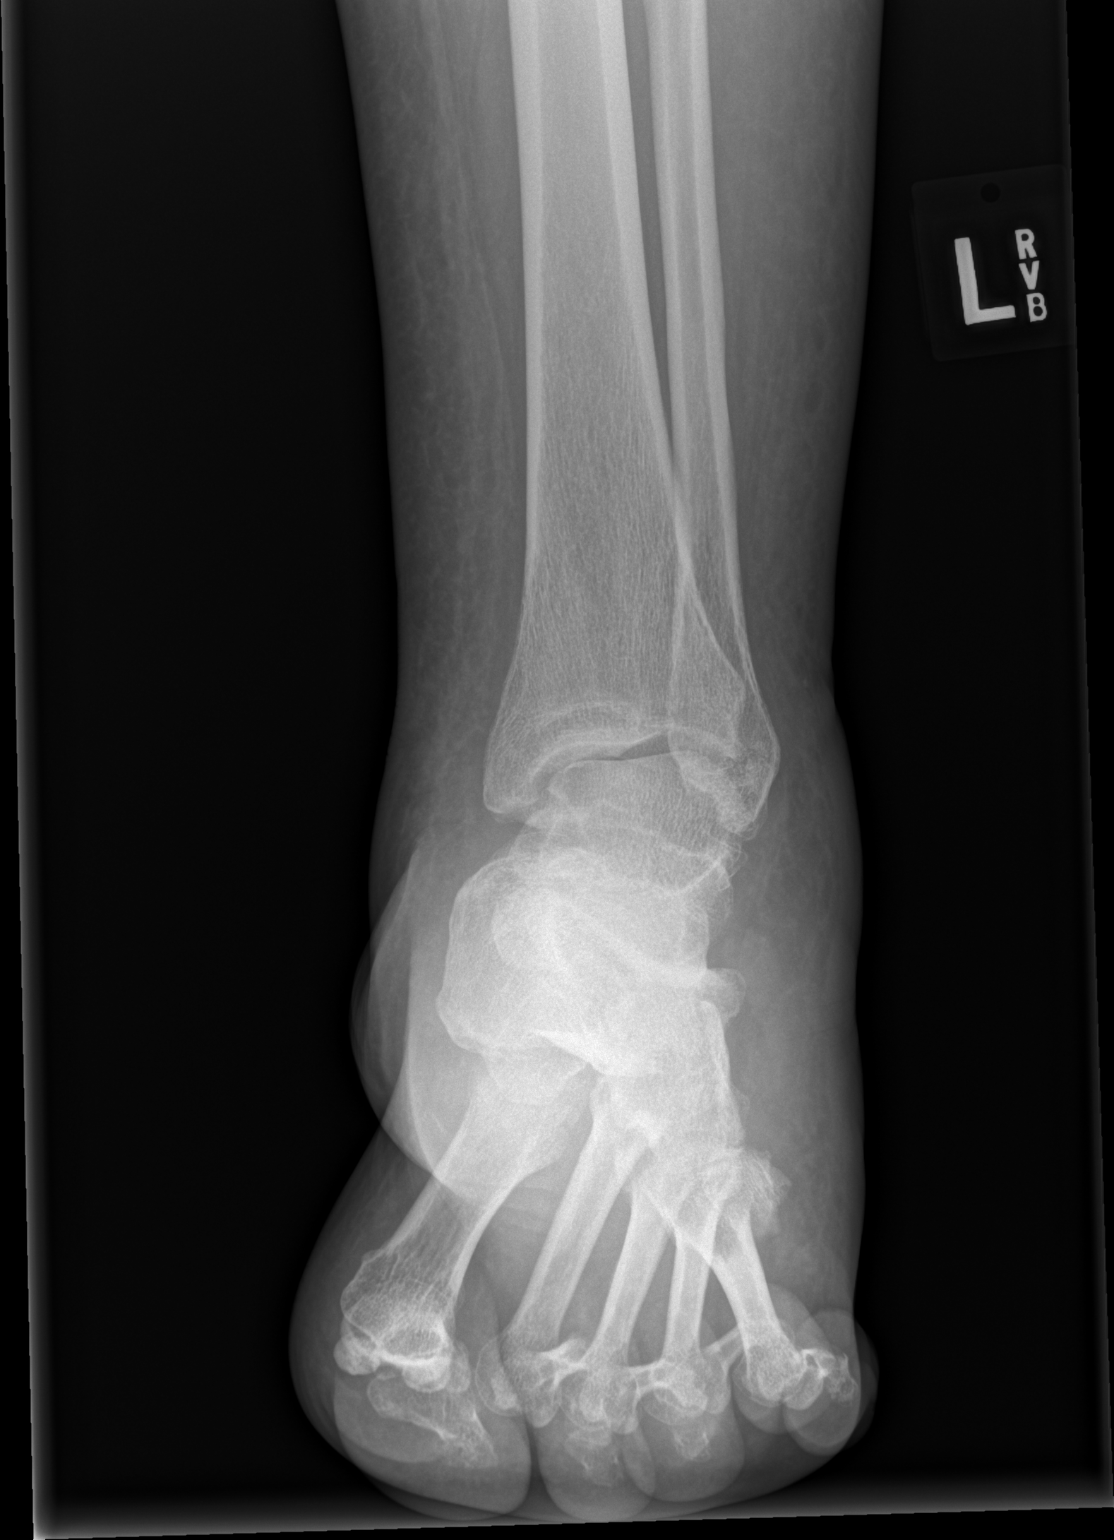

[x ankle obl left]
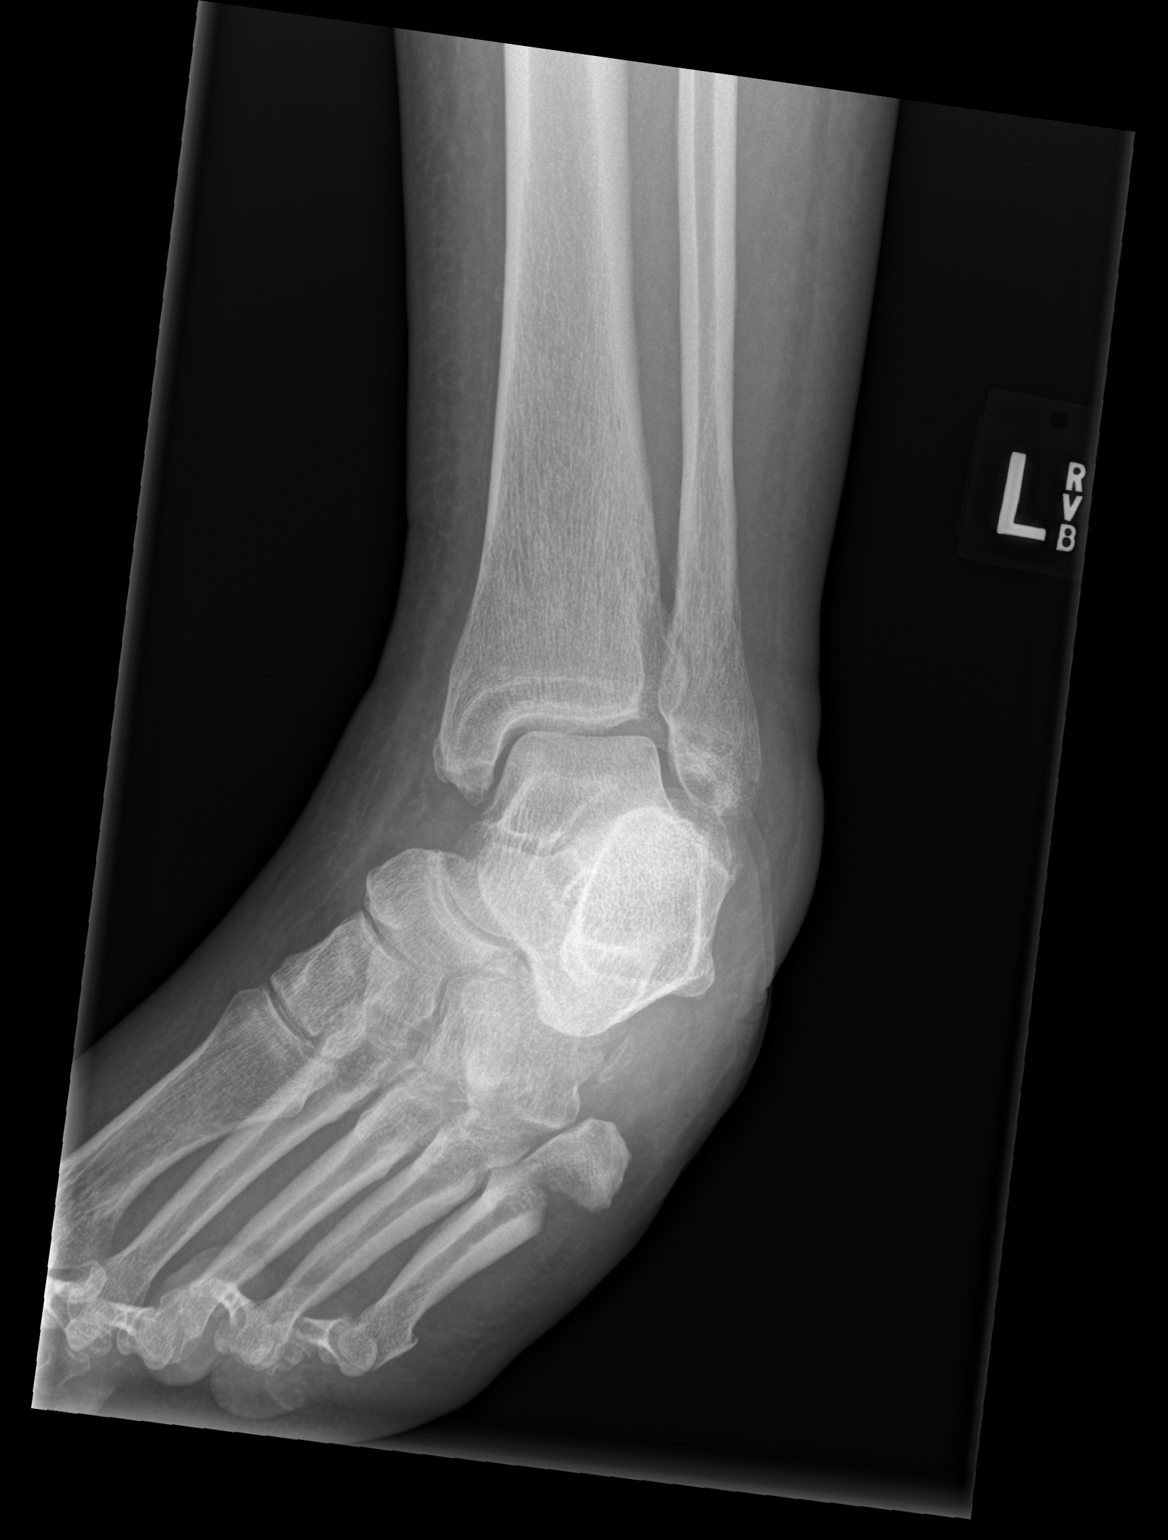

[x ankle lat left]
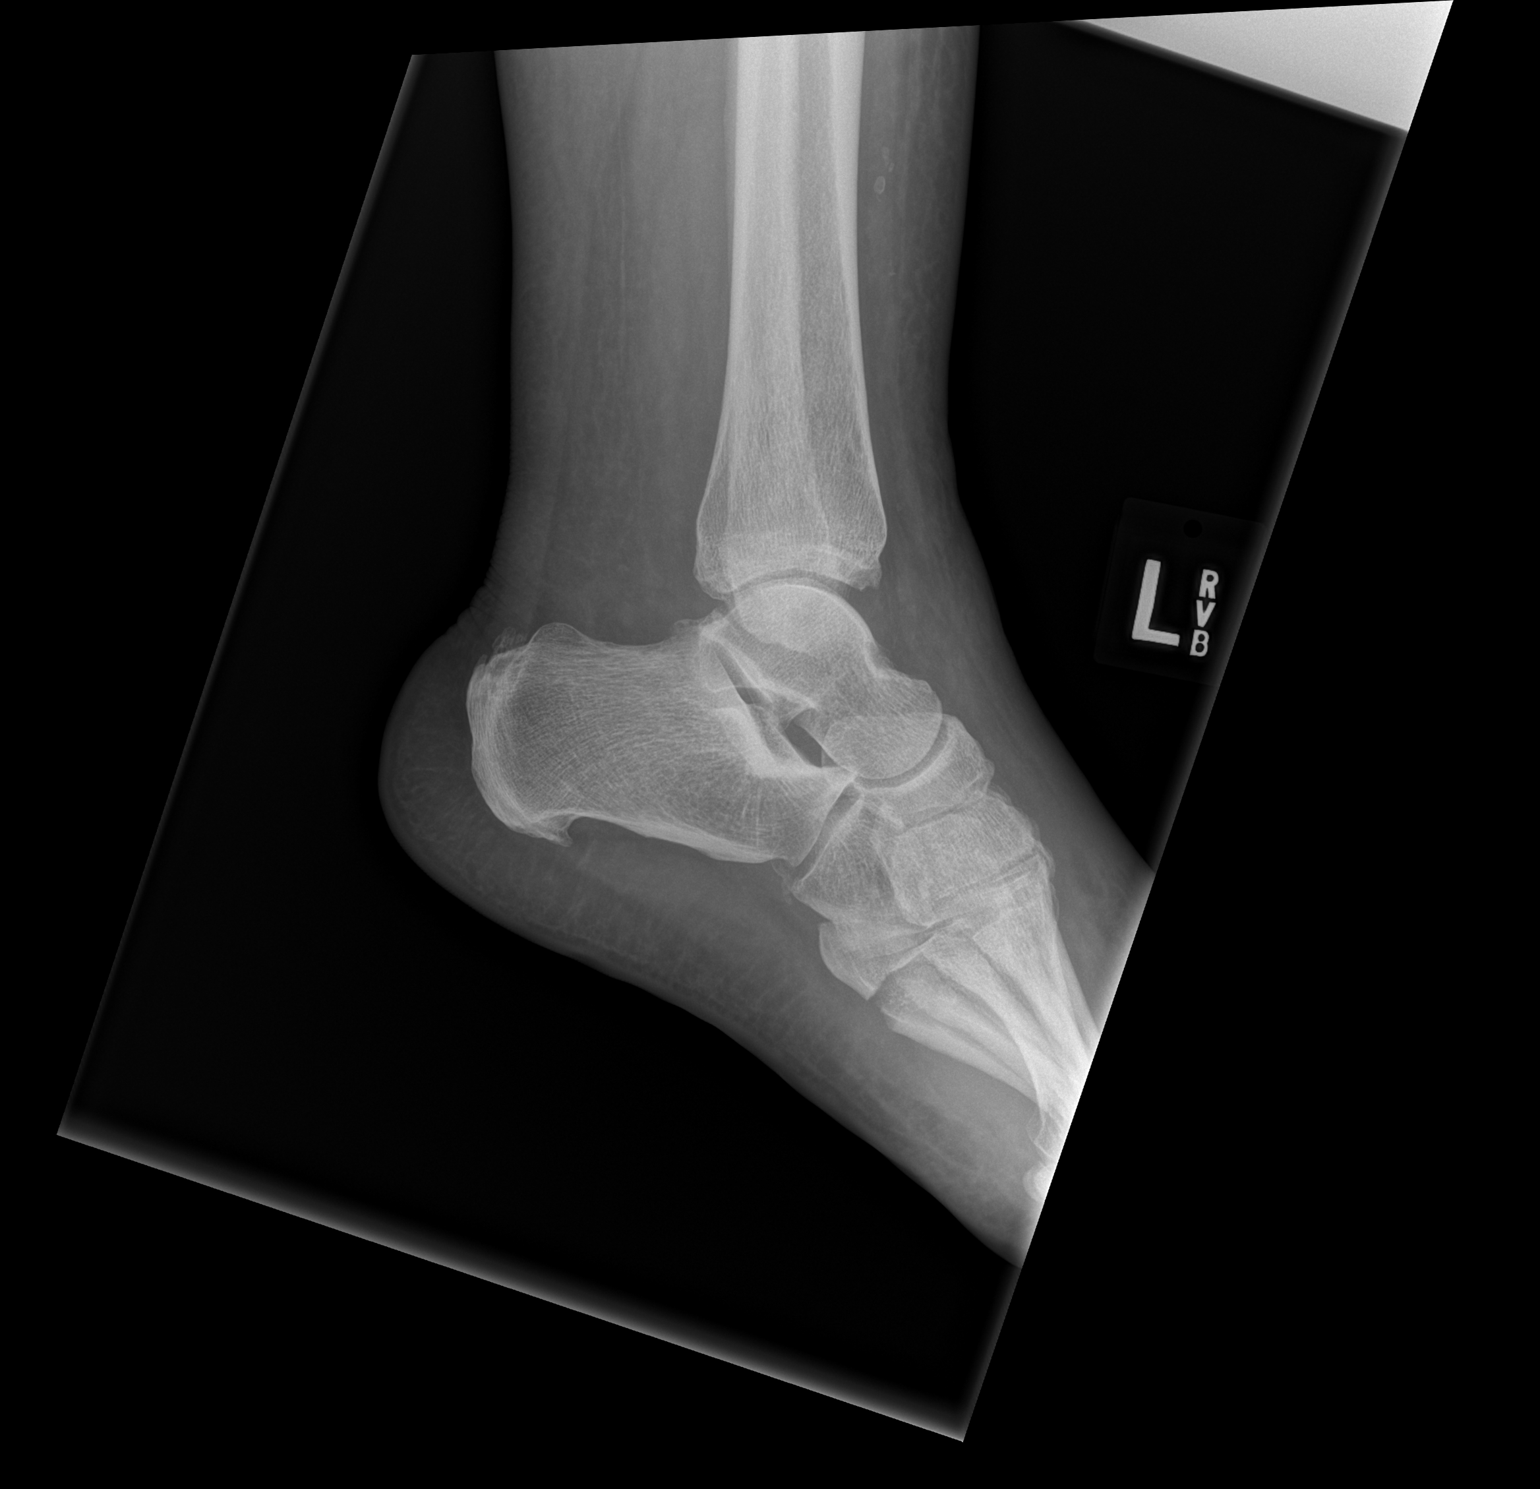

[3 of 3 positions shown; findings below may reference images not displayed]

FINDINGS: Diffuse soft tissue swelling lower leg, ankle, and into foot.

Osseous mineralization normal.

Joint spaces preserved.

Displaced fracture at base of fifth metatarsal.

Minimal bony resorption at fracture site consistent with subacute
age.

No additional fracture, dislocation, or bone destruction.

Small plantar calcaneal spur.
IMPRESSION: Displaced subacute fracture at base of LEFT fifth metatarsal.

## 2017-06-30 IMAGING — CR DG FOOT COMPLETE 3+V*L*
4 series · 4 of 4 positions shown · non-contrast
Comparison: None.

CLINICAL DATA: Took a step in her kitchen a week ago and heard
something crack, developed foot and ankle swelling, pain for 1 week,
initially got better for 3 days but now with worsening symptoms,
history hypertension, HIV, diabetes mellitus, stage II chronic
kidney disease

EXAM:
LEFT FOOT - COMPLETE 3+ VIEW

[x foot lat left]
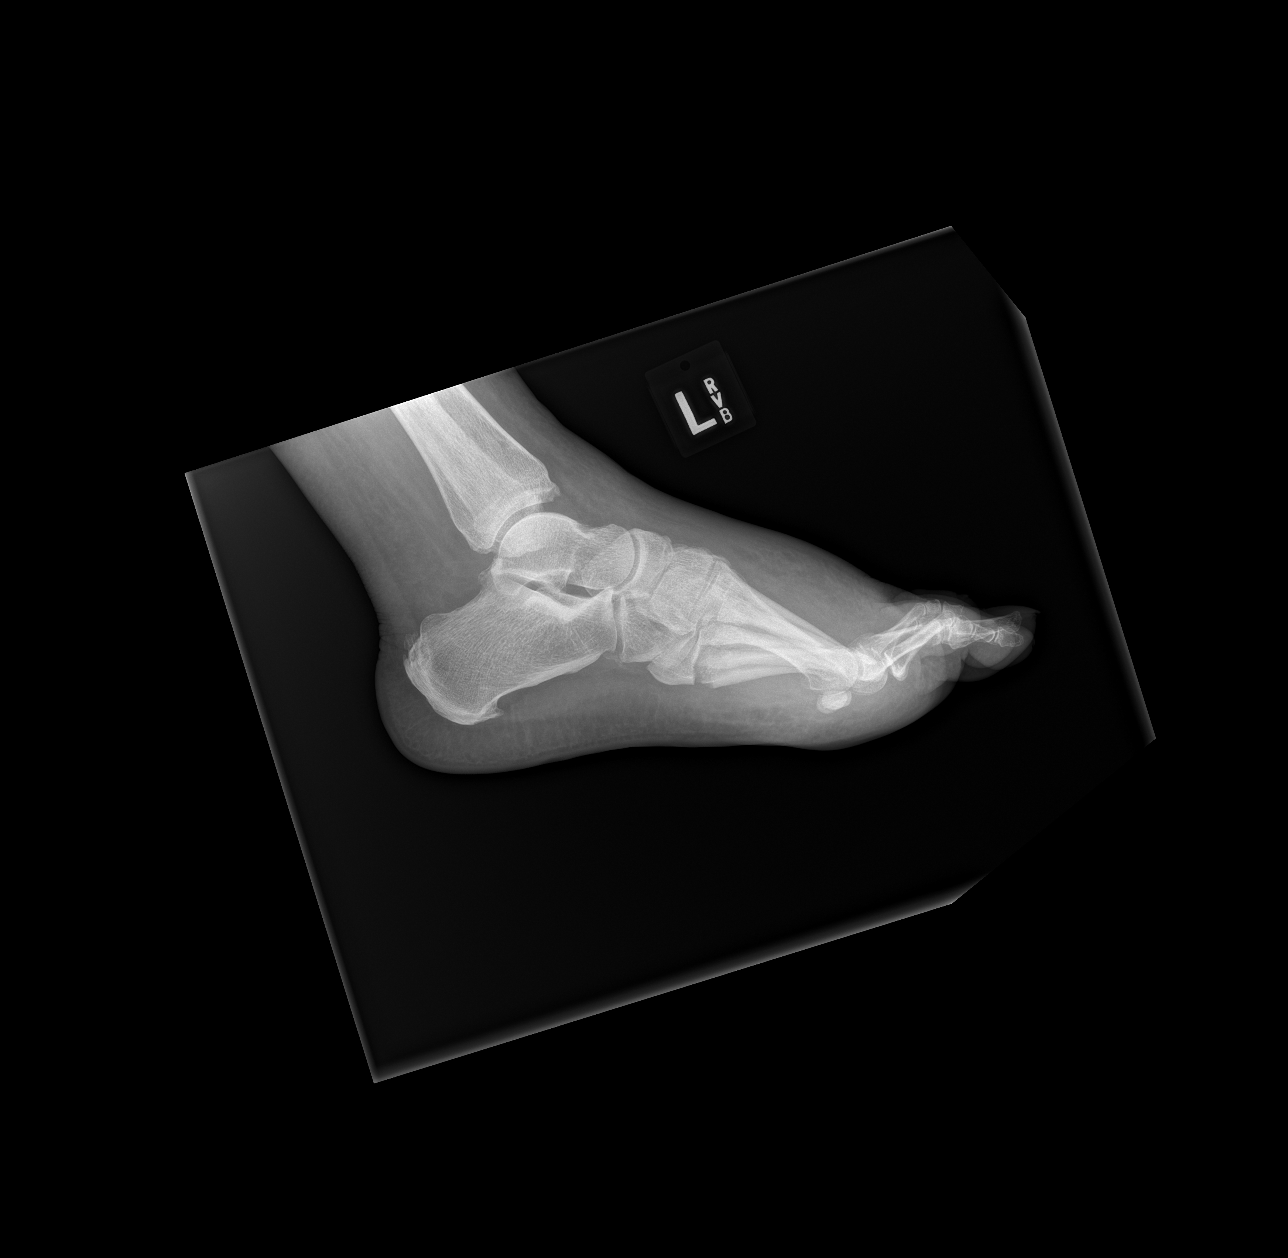

[x foot ap left]
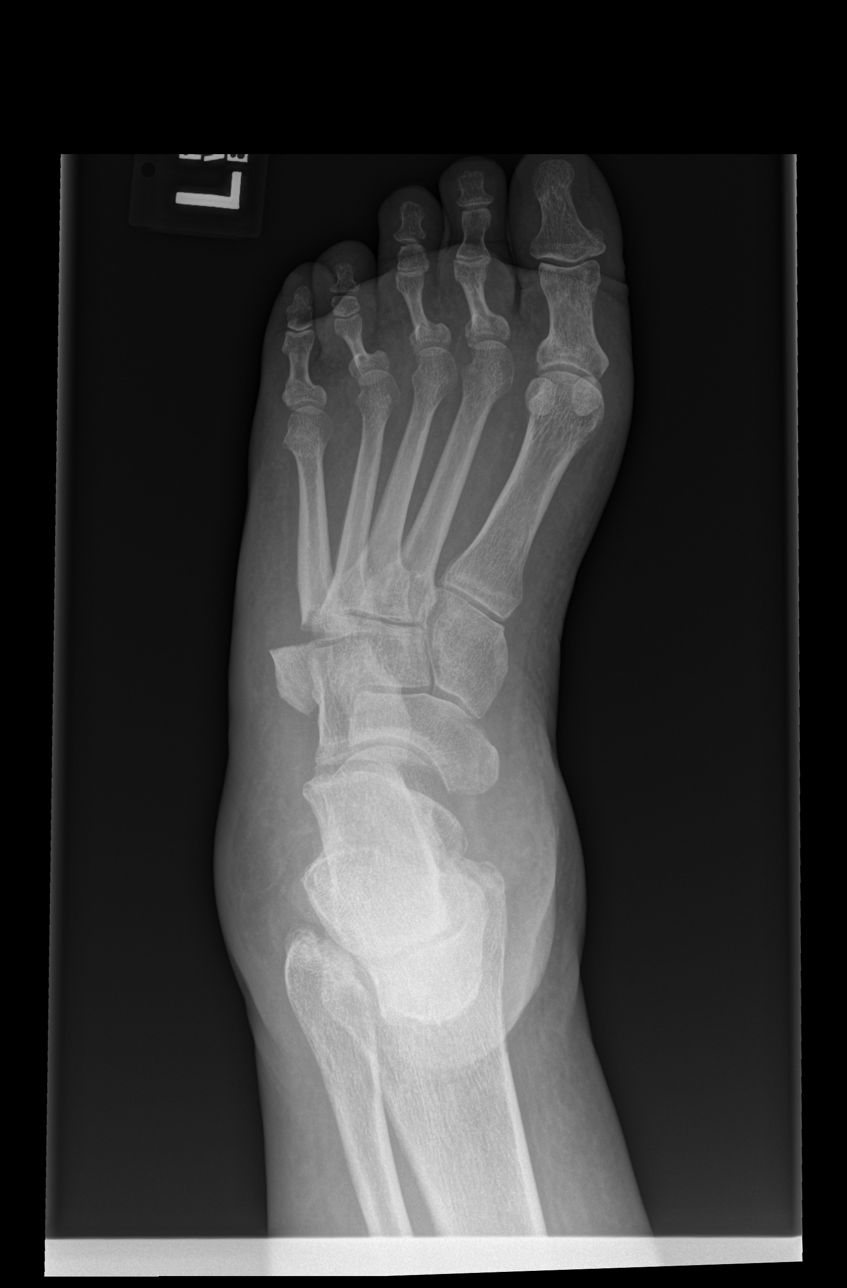

[x foot obl left (1 of 2)]
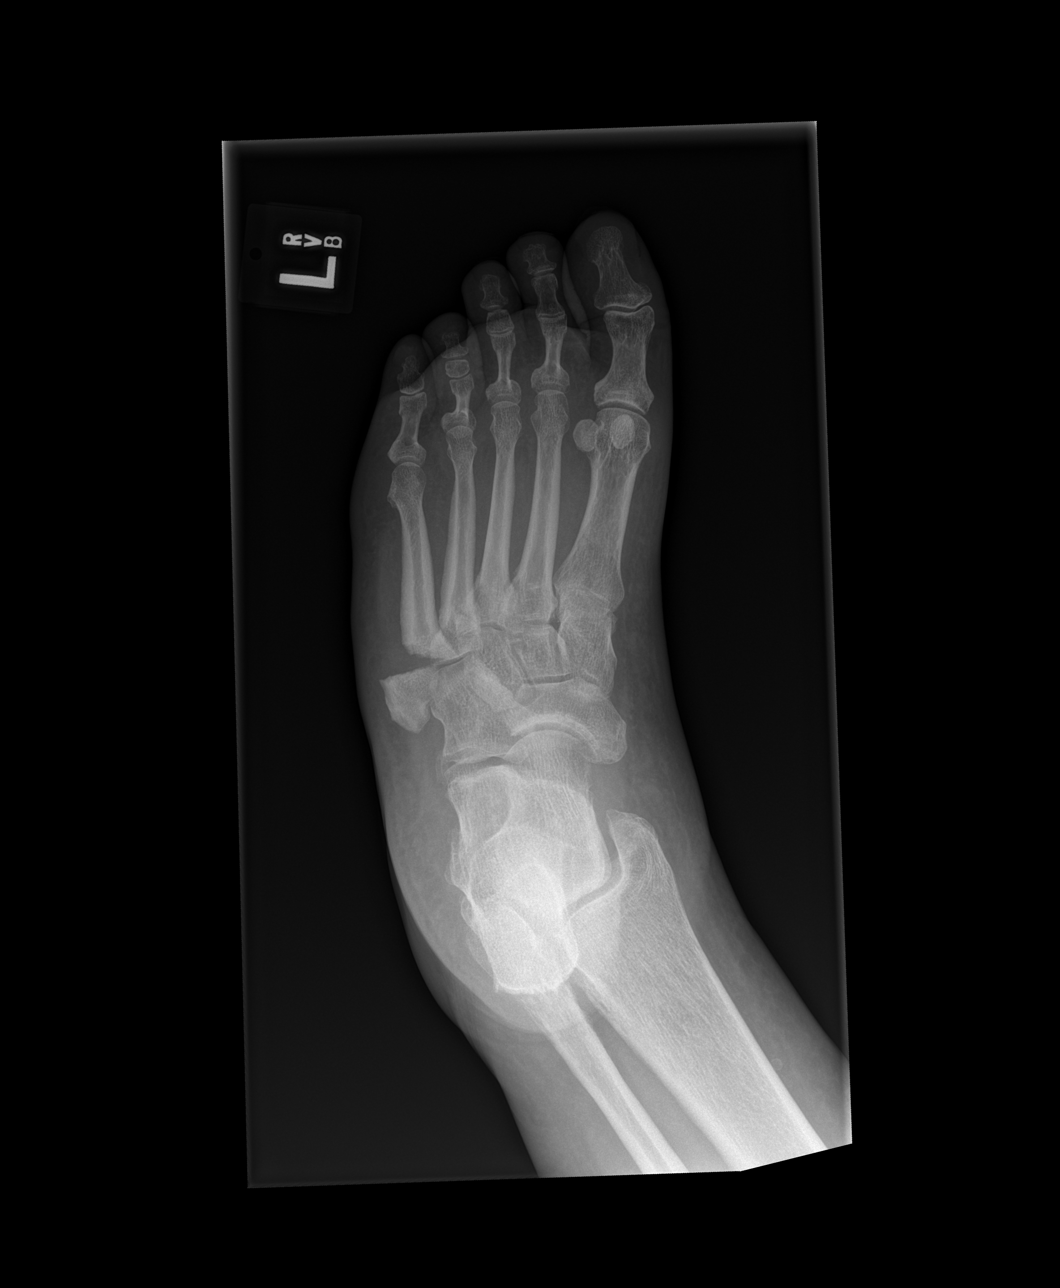

[x foot obl left (2 of 2)]
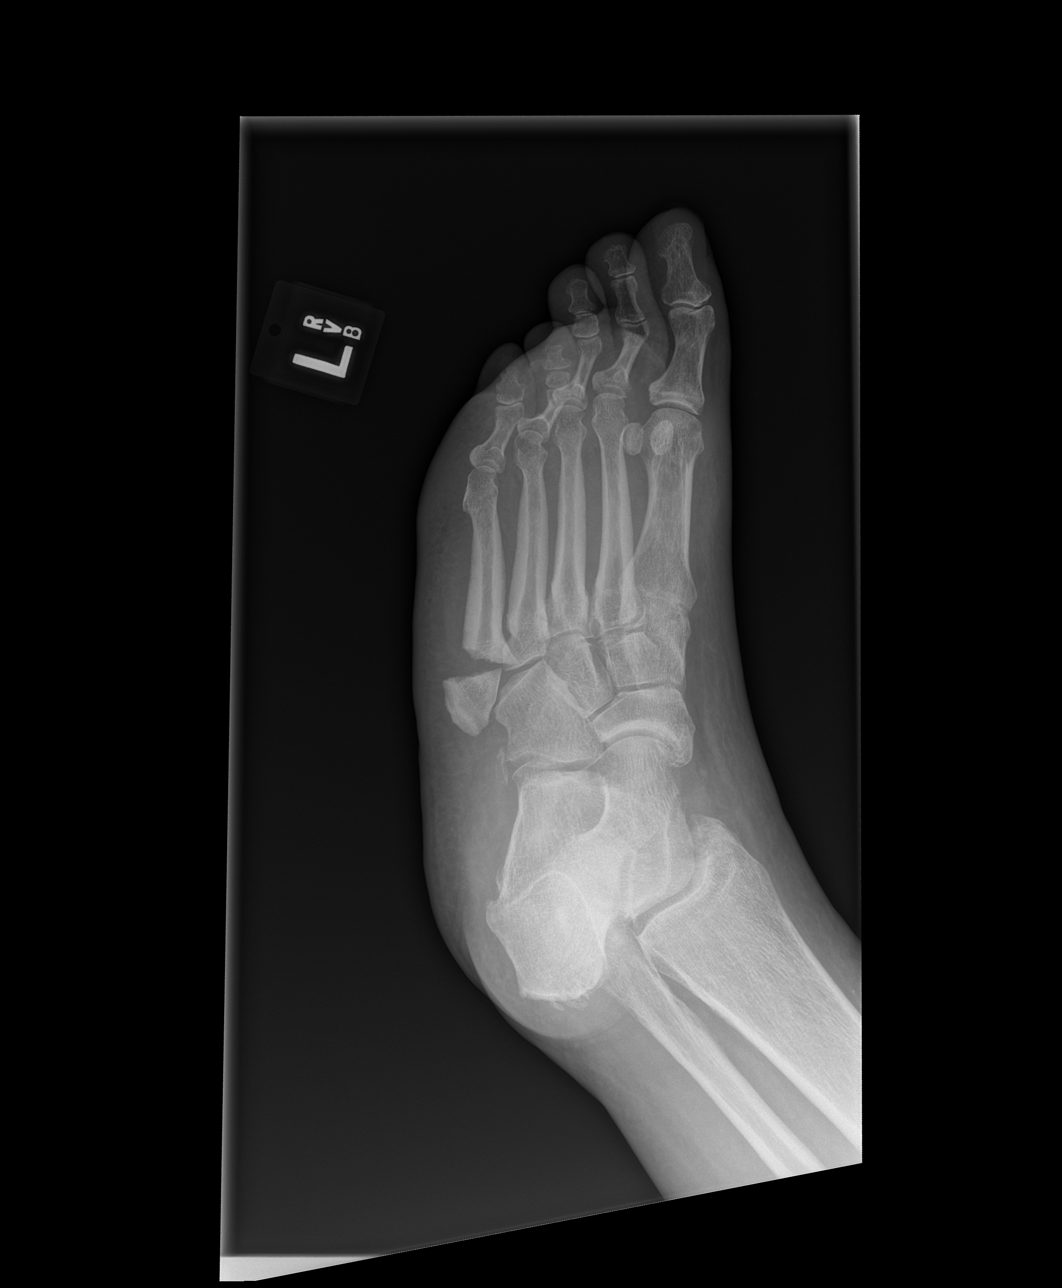

[4 of 4 positions shown; findings below may reference images not displayed]

FINDINGS: Mild osseous demineralization.

Joint spaces preserved.

Displaced fracture at base of LEFT fifth metatarsal.

No additional fracture, dislocation, or bone destruction.

Small plantar calcaneal spur.

Significant soft tissue swelling.
IMPRESSION: Displaced fracture at base of LEFT fifth metatarsal.

## 2017-06-30 MED ORDER — HYDROCODONE-ACETAMINOPHEN 5-325 MG PO TABS: 2.0000 | ORAL_TABLET | ORAL | 0 refills | Status: DC | PRN

## 2017-06-30 NOTE — ED Triage Notes (Signed)
Last Sat, stepped down and heard a cracking sound, since left foot painful and swollen

## 2017-06-30 NOTE — ED Provider Notes (Signed)
[facility] DEPT Provider Note   CSN: [id number] Arrival date & time: 06/30/17  1043  By signing my name below, I, Ephriam Jenkins, attest that this documentation has been prepared under the direction and in the presence of [employer].  Electronically Signed: Ephriam Jenkins, ED Scribe. 06/30/17. 11:47 AM.   History   Chief Complaint Chief Complaint  Patient presents with  . Foot Pain    HPI HPI Comments: Katrina Wolfe is a 51 y.o. female who presents to the Emergency Department complaining of persistent left foot pain that started approximately one week ago. Pt states that she was stepping into his kitchen at her house and reports hearing a "crack" sound and thought she may have stepped on something. She had immediate pain afterwards and notes that the following day her left foot was more swollen. She has tried soaking with Epsom salt with no relief. She reports pain to the lateral aspect of her left ankle and left fifth toe. She occasionally has swelling in her lower extremities due to her diabetes but never this significant. She notes that she has had some difficulty with ambulation within the past 2 days due to worsening swelling. Pt further reports subjective numbness to left fourth toe. No other associated symptoms.   The history is provided by the patient. No language interpreter was used.    Past Medical History:  Diagnosis Date  . Abnormal uterine bleeding (AUB)   . Arthritis    knees  . CKD (chronic kidney disease), stage II   . Congenital cardiomegaly    per pt has always been told since childhood and siblings   . GERD (gastroesophageal reflux disease)   . History of genital warts   . HIV (human immunodeficiency virus infection) ([place])    [place]-- DR Carlyle Basques  . Hypertension   . Intermittent palpitations    mild-- no meds  . Legally blind in left eye, as defined in [place]    trauma as child  . Type 2 diabetes mellitus ([place])     . Uterine fibroid   . VIN III (vulvar intraepithelial neoplasia III)    and Verrucoid lesion on the mons  . Wears glasses     Patient Active Problem List   Diagnosis Date Noted  . HIV disease ([place]) 10/27/2015  . DM type 2 (diabetes mellitus, type 2) ([place]) 10/27/2015  . Hyperlipidemia 10/27/2015  . Essential hypertension 10/27/2015  . Severe vulvar dysplasia, histologically confirmed 10/15/2015    Past Surgical History:  Procedure Laterality Date  . CESAREAN SECTION  2001  &  1984  . CO2 LASER APPLICATION N/A 42/04/9562   Procedure: CO2 LASER APPLICATION AND WIDE VOCAL EXCSION OF LESION ON MONS PUBIS;  Surgeon: Marti Sleigh, MD;  Location: [facility];  Service: Gynecology;  Laterality: N/A;   CASE CANCELLED   . CO2 LASER APPLICATION N/A 8/75/6433   Procedure: CO2 LASER OF VULVAR;  Surgeon: Marti Sleigh, MD;  Location[facility];  Service: Gynecology;  Laterality: N/A;  . CORNEAL TRANSPLANT Left 2006   failed  . D & C HYSTEROSCOPY /  RESECTION FIBROID  2004  . KNEE ARTHROSCOPY W/ ACL RECONSTRUCTION Bilateral 2008  . VULVECTOMY N/A 01/07/2016   Procedure: WIDE LOCAL EXCISION;  Surgeon: Marti Sleigh, MD;  Location: [facility];  Service: Gynecology;  Laterality: N/A;  mons pubis as site    OB History    No data available  Home Medications    Prior to Admission medications   Medication Sig Start Date End Date Taking? Authorizing Provider  atorvastatin (LIPITOR) 10 MG tablet Take 10 mg by mouth every morning.  10/12/15   [provider]  calcium carbonate (TUMS - DOSED IN MG ELEMENTAL CALCIUM) 500 MG chewable tablet Chew 1 tablet by mouth as needed for indigestion or heartburn.    [provider]  CALCIUM PO Take 2 tablets by mouth daily.    [provider]  Cats Claw, Uncaria tomentosa, 500 MG CAPS Take 2 capsules by mouth every morning.     [provider]   cloNIDine (CATAPRES) 0.3 MG tablet Take 0.3 mg by mouth 2 (two) times daily.  10/06/15   [provider]  Coconut Oil 1000 MG CAPS Take 2 capsules by mouth daily.    [provider]  GENVOYA 150-150-200-10 MG TABS tablet TAKE 1 TABLET BY MOUTH DAILY WITH BREAKFAST 05/07/17   Carlyle Basques, MD  HUMALOG MIX 75/25 KWIKPEN (75-25) 100 UNIT/ML Kwikpen Inject 50 Units into the skin 2 (two) times daily.  10/06/15   [provider]  hydrochlorothiazide (HYDRODIURIL) 25 MG tablet Take 1 tablet (25 mg total) by mouth daily. 01/02/15   Hess, Hessie Diener, PA-C  Insulin Syringe-Needle U-100 (INSULIN SYRINGE 1CC/31GX5/16") 31G X 5/16" 1 ML MISC 1 Units by Does not apply route 2 (two) times daily. 11/26/15   Forde Dandy, MD  Lancets [facility] ULTRASOFT) lancets  11/29/15   [provider]  lisinopril (PRINIVIL,ZESTRIL) 10 MG tablet Take 1 tablet (10 mg total) by mouth daily. Patient not taking: Reported on 06/07/2017 01/02/15   Carman Ching, PA-C  metFORMIN (GLUCOPHAGE) 1000 MG tablet Take 1,000 mg by mouth 2 (two) times daily with a meal.  09/09/15   [provider]  ONE TOUCH ULTRA TEST test strip  11/29/15   [provider]    Family History Family History  Problem Relation Age of Onset  . Hypertension Mother   . Diabetes Father   . Cancer Brother   . Breast cancer Cousin     Social History Social History  Substance Use Topics  . Smoking status: Never Smoker  . Smokeless tobacco: Never Used  . Alcohol use Not on file     Allergies   Shellfish allergy and Bactrim [sulfamethoxazole-trimethoprim]   Review of Systems Review of Systems  Musculoskeletal: Positive for arthralgias (left foot, left fifth toe) and gait problem (secondary to pain).       Swelling from left foot to mid-calf  Neurological: Positive for numbness (subjective numbness to left fourth toe).     Physical Exam Updated Vital Signs BP (!) 150/85 (BP Location: Left Arm)    Pulse 89   Temp 98.2 F (36.8 C) (Oral)   Resp 16   Ht 5' 9.5" (1.765 m)   Wt 270 lb (122.5 kg)   LMP 07/07/2015 (Exact Date)   SpO2 97%   BMI 39.30 kg/m   Physical Exam  Constitutional: She is oriented to person, place, and time. She appears well-developed and well-nourished. No distress.  HENT:  Head: Normocephalic and atraumatic.  Eyes: Pupils are equal, round, and reactive to light. Conjunctivae are normal. Right eye exhibits no discharge. Left eye exhibits no discharge. No scleral icterus.  Neck: Normal range of motion.  Cardiovascular: Normal rate.   Pulmonary/Chest: Effort normal. No respiratory distress.  Abdominal: She exhibits no distension.  Musculoskeletal: She exhibits edema and tenderness. She exhibits no  deformity.  Large amount of swelling extending from the left foot to mid-calf. TTP along the fifth metatarsal and fifth toe. Subjective numbness of the fourth toe.  Neurological: She is alert and oriented to person, place, and time.  Skin: Skin is warm and dry. She is not diaphoretic.  Psychiatric: She has a normal mood and affect. Her behavior is normal. Judgment normal.  Nursing note and vitals reviewed.    ED Treatments / Results  DIAGNOSTIC STUDIES: Oxygen Saturation is 97% on RA, normal by my interpretation.  COORDINATION OF CARE: 11:42 AM-Discussed treatment plan with pt at bedside and pt agreed to plan.   Labs (all labs ordered are listed, but only abnormal results are displayed) Labs Reviewed - No data to display  EKG  EKG Interpretation None       Radiology No results found.  Procedures Procedures (including critical care time)  SPLINT APPLICATION Date/Time: 0/21/1155, 13:31  Authorized by: Recardo Evangelist Consent: Verbal consent obtained. Risks and benefits: risks, benefits and alternatives were discussed Consent given by: patient Splint applied by: orthopedic technician Location details:  Splint type: CAM walker Supplies used:  CAM walker Post-procedure: The splinted body part was neurovascularly unchanged following the procedure. Patient tolerance: Patient tolerated the procedure well with no immediate complications.   Medications Ordered in ED Medications - No data to display   Initial Impression / Assessment and Plan / ED Course  I have reviewed the triage vital signs and the nursing notes.  Pertinent labs & imaging results that were available during my care of the patient were reviewed by me and considered in my medical decision making (see chart for details).  51 year old female presents with displaced 5th metatarsal fx. There is concern for pathologic fx since MOI was very mild and there is a large amount of swelling extending to the calf. DVT study was negative. Pt will be given CAM walker and crutches and made NWB till she can see Orthopedics. She verbalized understanding. Will send her with short course of pain medicine as well for fx and advised elevating above level of heart as much as possible. Return precautions were given.  Final Clinical Impressions(s) / ED Diagnoses   Final diagnoses:  Displaced fracture of fifth metatarsal bone, left foot, initial encounter for closed fracture    New Prescriptions New Prescriptions   No medications on file   I personally performed the services described in this documentation, which was scribed in my presence. The recorded information has been reviewed and is accurate.     Recardo Evangelist, PA-C 06/30/17 Alda Berthold    Charlesetta Shanks, MD 07/05/17 1700

## 2017-06-30 NOTE — Progress Notes (Signed)
**  Preliminary report by tech**  Left lower extremity venous duplex complete. There is no evidence of deep or superficial vein thrombosis involving the left lower extremity. All visualized vessels appear patent and compressible. There is no evidence of a Baker's cyst on the left. Results were given to Recardo Evangelist PA.  06/30/17 1:31 PM Carlos Levering RVT

## 2017-06-30 NOTE — Discharge Instructions (Signed)
Please use crutches and avoid putting weight on left foot until you see orthopedics Elevate left leg above level of heart Take pain medicine as needed

## 2017-06-30 NOTE — ED Triage Notes (Signed)
Technician at bedside for venous doppler

## 2017-06-30 NOTE — ED Notes (Signed)
Bed: WTR6 Expected date:  Expected time:  Means of arrival:  Comments: 

## 2017-07-04 ENCOUNTER — Ambulatory Visit
Admission: RE | Admit: 2017-07-04 | Discharge: 2017-07-04 | Disposition: A | Discharge status: Home / Self Care | Payer: Medicare Other | Source: Ambulatory Visit | Attending: Internal Medicine | Admitting: Internal Medicine

## 2017-07-04 ENCOUNTER — Other Ambulatory Visit: Payer: Self-pay | Admitting: Internal Medicine

## 2017-07-04 DIAGNOSIS — R928 Other abnormal and inconclusive findings on diagnostic imaging of breast: Secondary | ICD-10-CM

## 2017-07-04 DIAGNOSIS — N6320 Unspecified lump in the left breast, unspecified quadrant: Secondary | ICD-10-CM

## 2017-07-04 IMAGING — MG 2D DIGITAL DIAGNOSTIC UNILATERAL LEFT MAMMOGRAM WITH CAD AND ADJ
6 series · 6 of 14 positions shown · non-contrast
Comparison: Previous exam(s).

CLINICAL DATA: Screening recall for a possible left breast mass.

EXAM:
2D DIGITAL DIAGNOSTIC UNILATERAL LEFT MAMMOGRAM WITH CAD AND ADJUNCT
TOMO
LEFT BREAST ULTRASOUND

[L MLO]
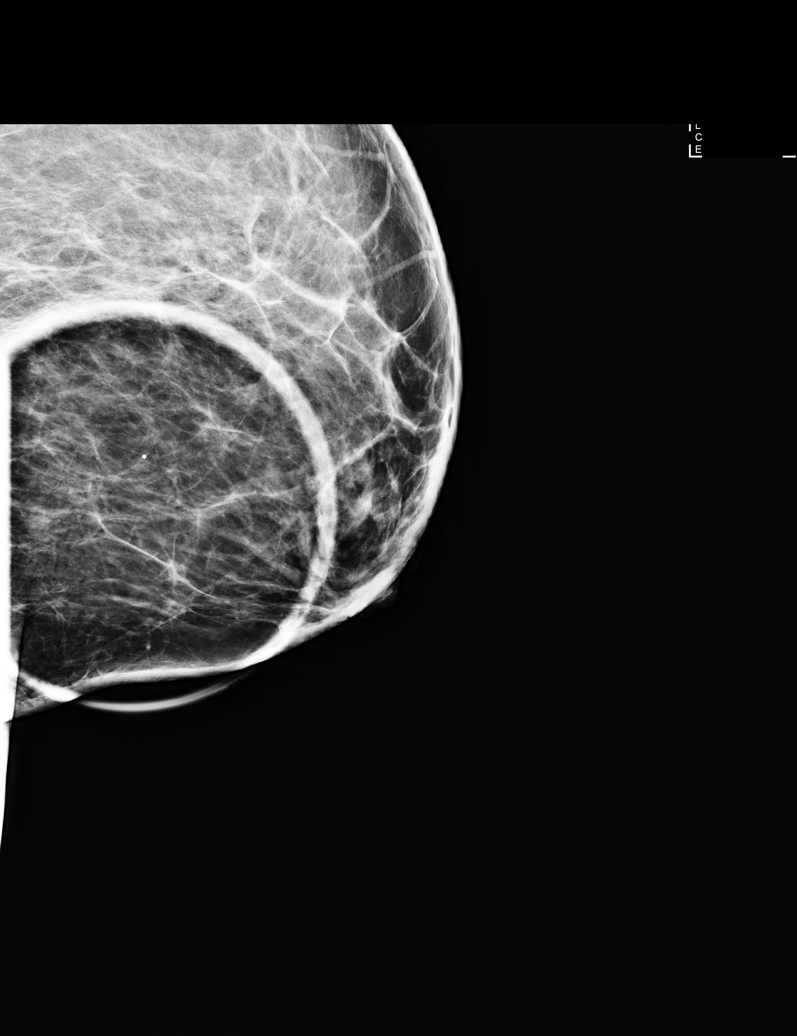

[L MLO synth-2D]
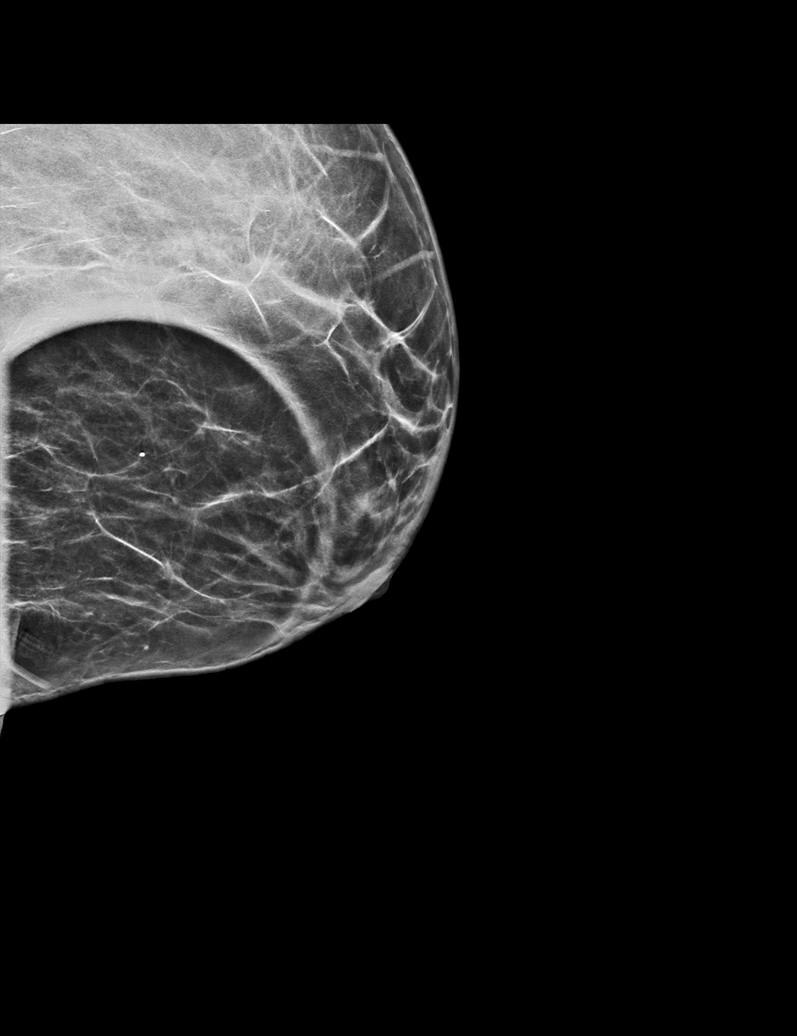

[L CC synth-2D]
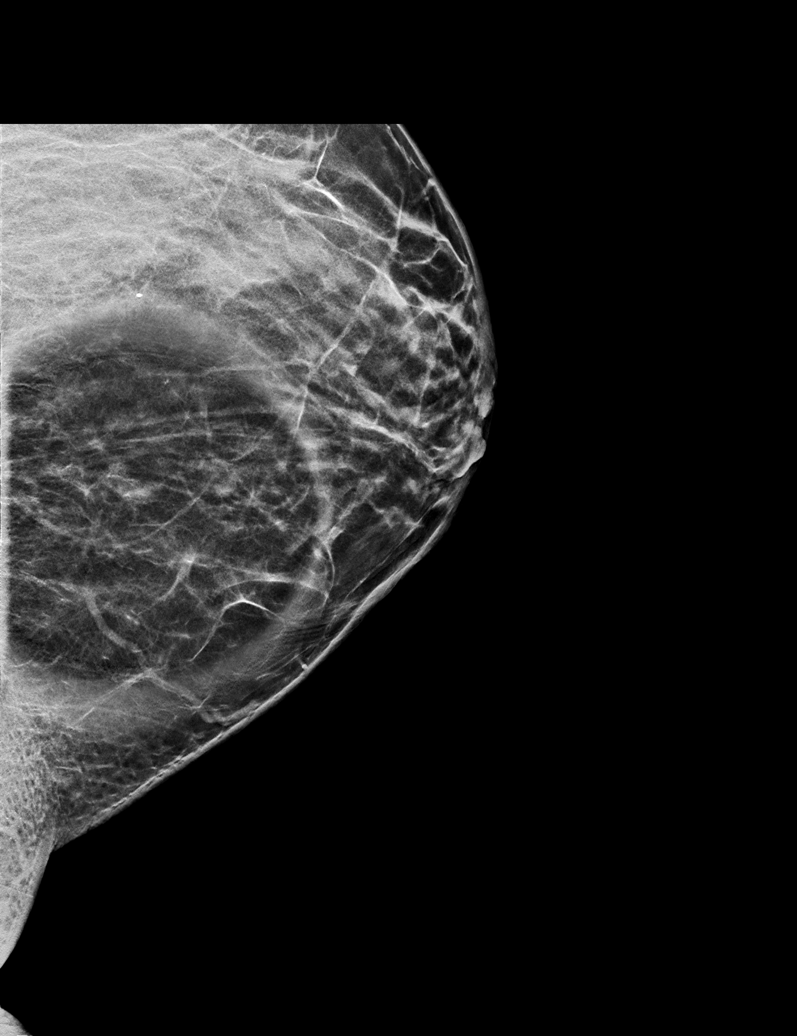

[L CC]
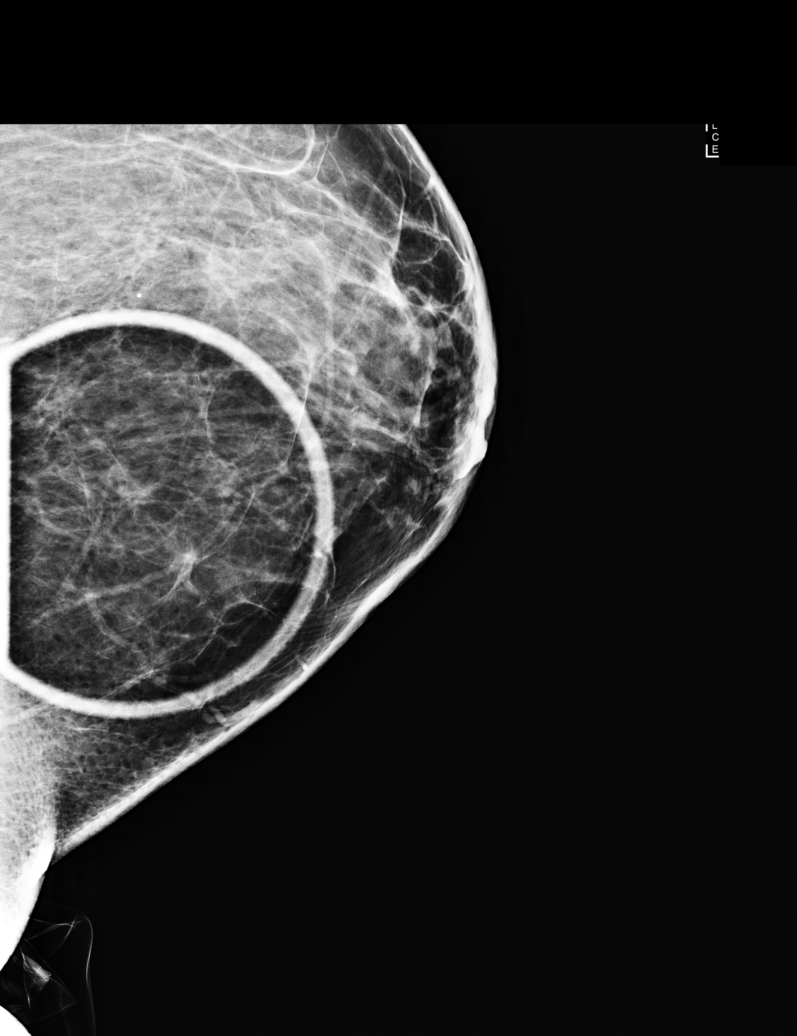

[L MLO tomo · tomo slice 28/55.0]
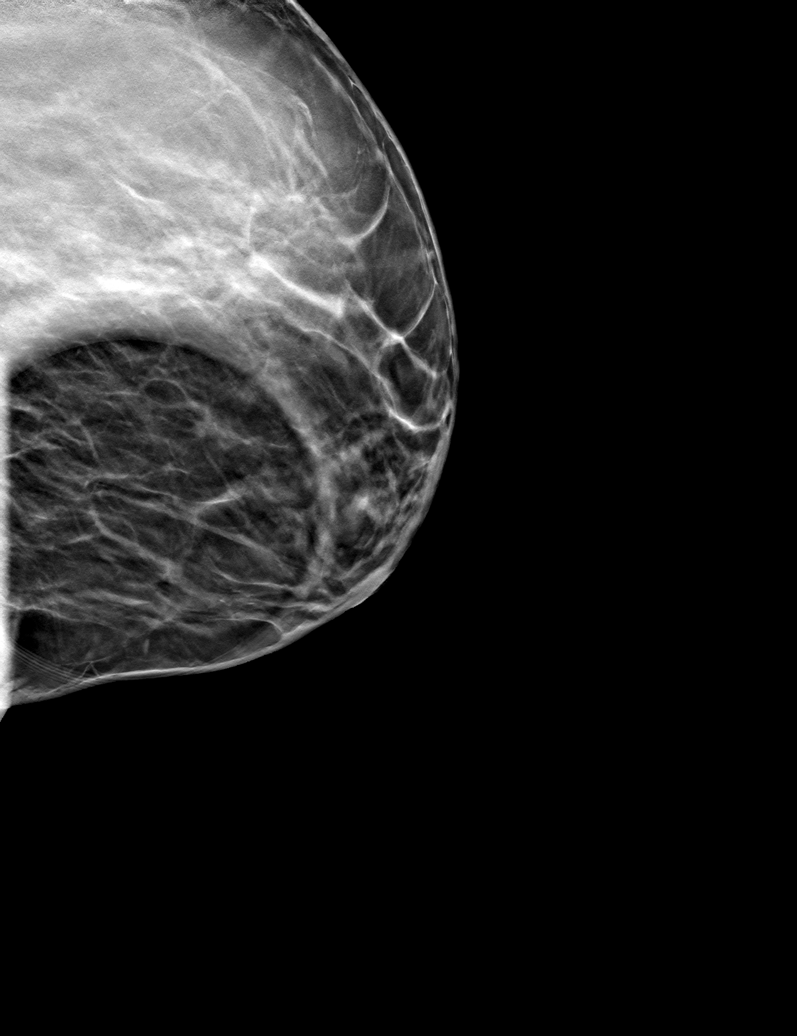

[L CC tomo · tomo slice 14/27.0]
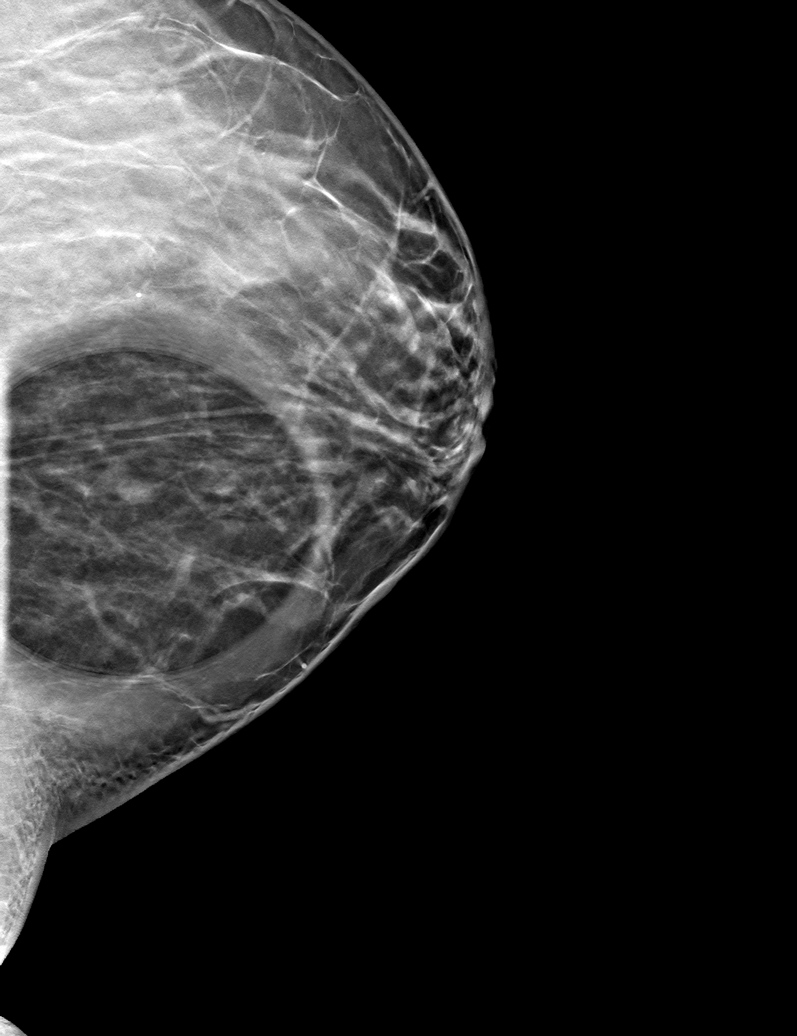

[6 of 14 positions shown; findings below may reference images not displayed]

ACR Breast Density Category b: There are scattered areas of
fibroglandular density.
FINDINGS: In the lower-inner quadrant of the left breast there is a possible
small obscured mass.

Mammographic images were processed with CAD.

Ultrasound targeted to the lower-inner quadrant of the left breast
at 8 o'clock, 2 cm from the nipple demonstrates a circumscribed
hypoechoic oval mass measuring 6 x 3 x 5 mm. No blood flow was
identified within the mass on color Doppler imaging.
IMPRESSION: There is a probably benign mass in the left breast at 8 o'clock.
This could represent fibrocystic change or a solid benign lesion
such as a fibroadenoma.

RECOMMENDATION:
Six-month follow-up left breast ultrasound is recommended.

I have discussed the findings and recommendations with the patient.
Results were also provided in writing at the conclusion of the
visit. If applicable, a reminder letter will be sent to the patient
regarding the next appointment.

BI-RADS CATEGORY  3: Probably benign.

## 2017-07-04 IMAGING — US ULTRASOUND LEFT BREAST LIMITED
1 series · 5 of 5 positions shown · non-contrast
Comparison: Previous exam(s).

CLINICAL DATA: Screening recall for a possible left breast mass.

EXAM:
2D DIGITAL DIAGNOSTIC UNILATERAL LEFT MAMMOGRAM WITH CAD AND ADJUNCT
TOMO
LEFT BREAST ULTRASOUND

[Series 1: ultrasound left breast limited · 0.05mm/px · 5 of 5 slices shown]
[im 1/5]
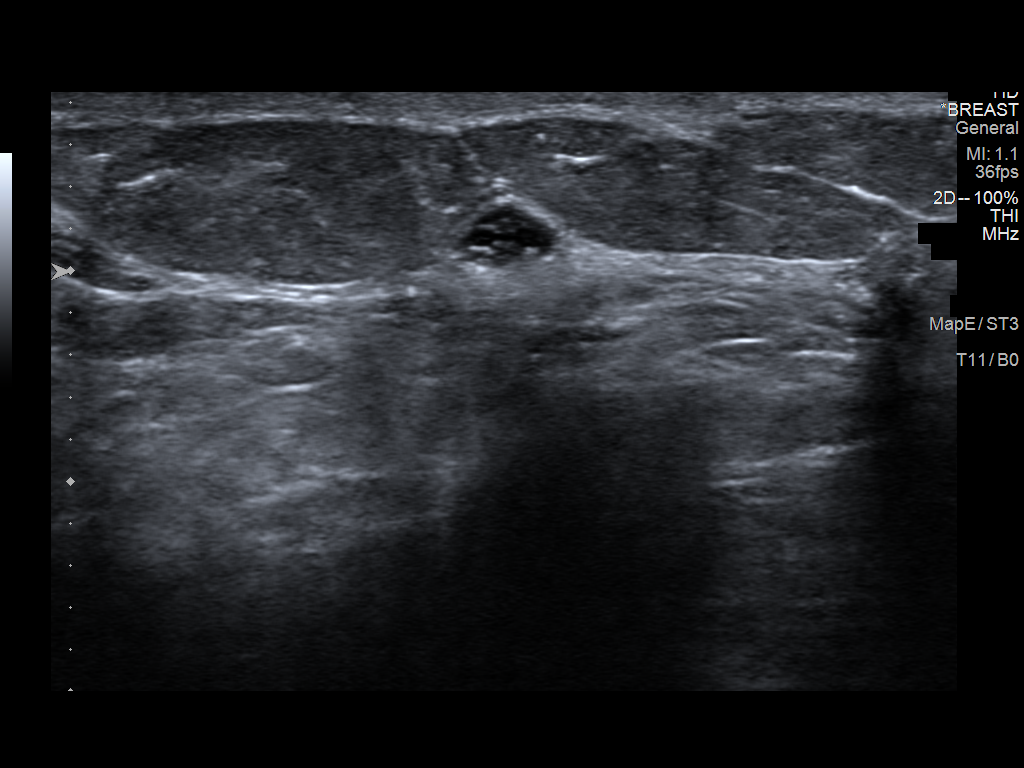
[im 2/5]
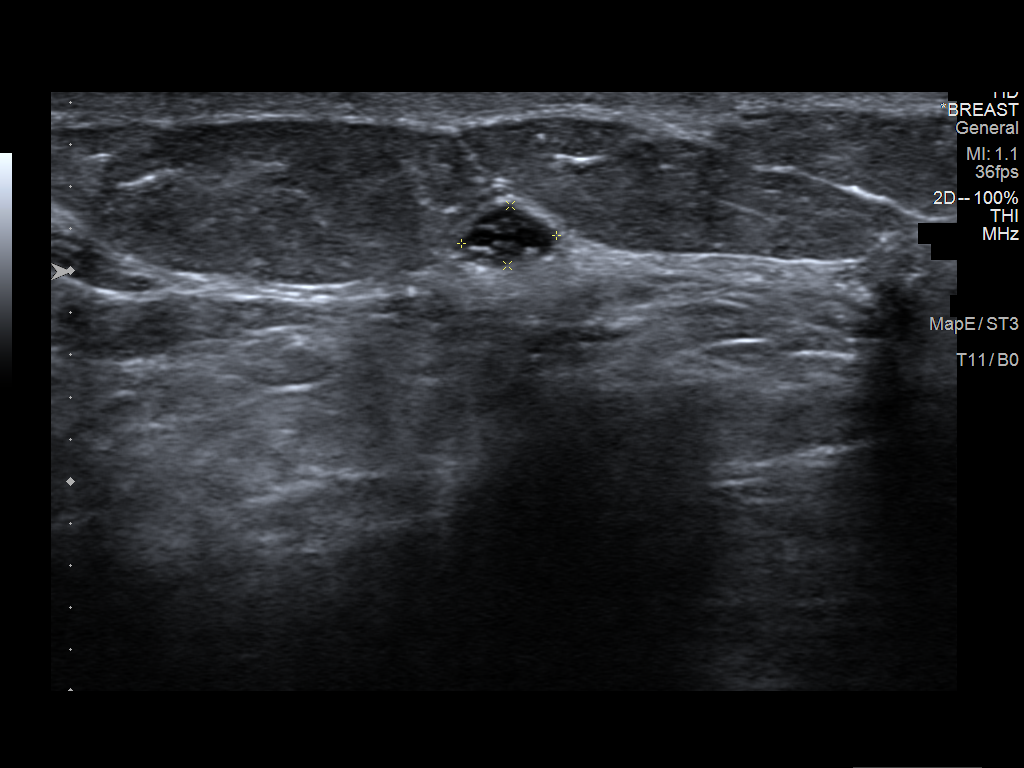
[im 3/5]
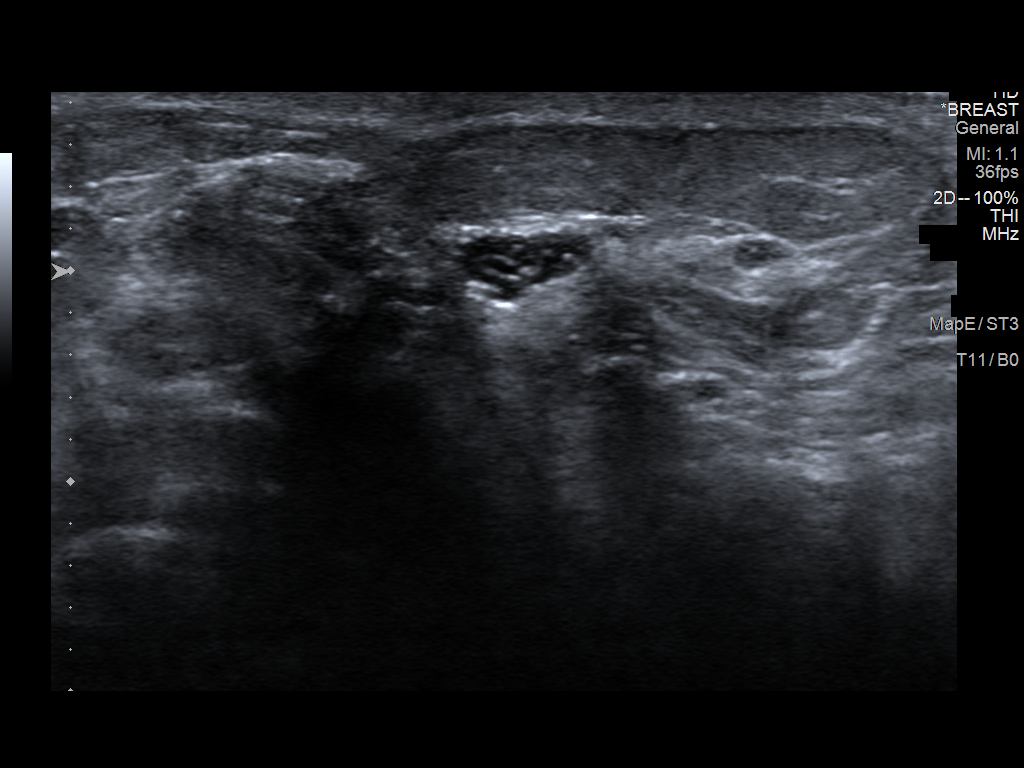
[im 4/5]
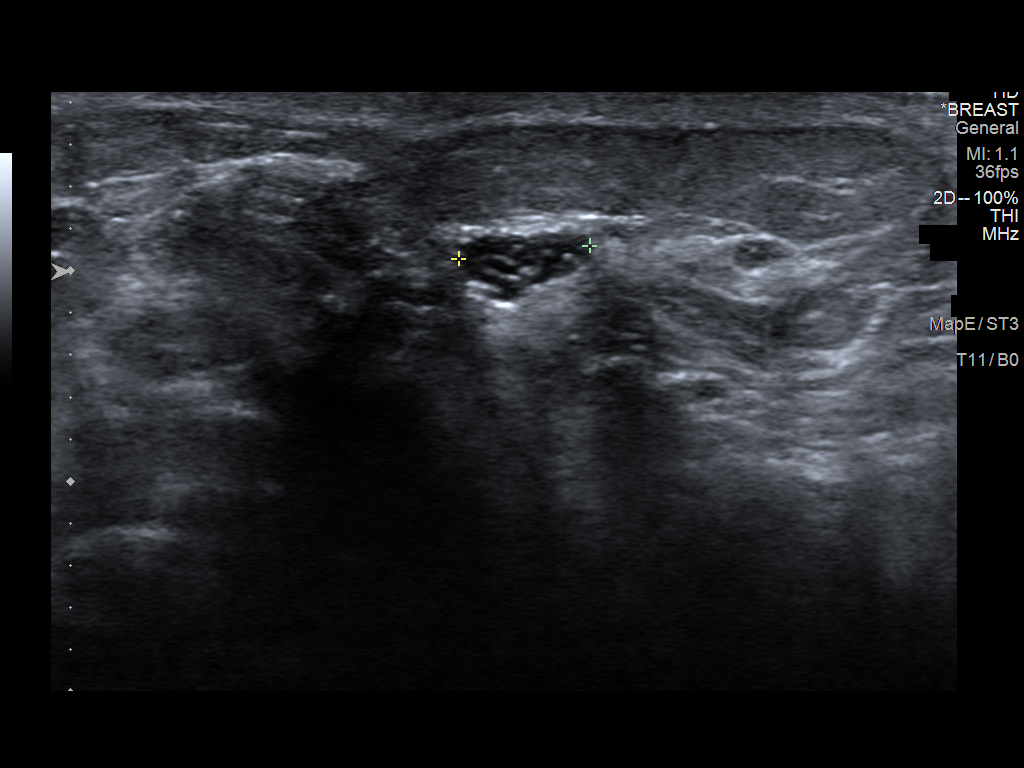
[im 5/5]
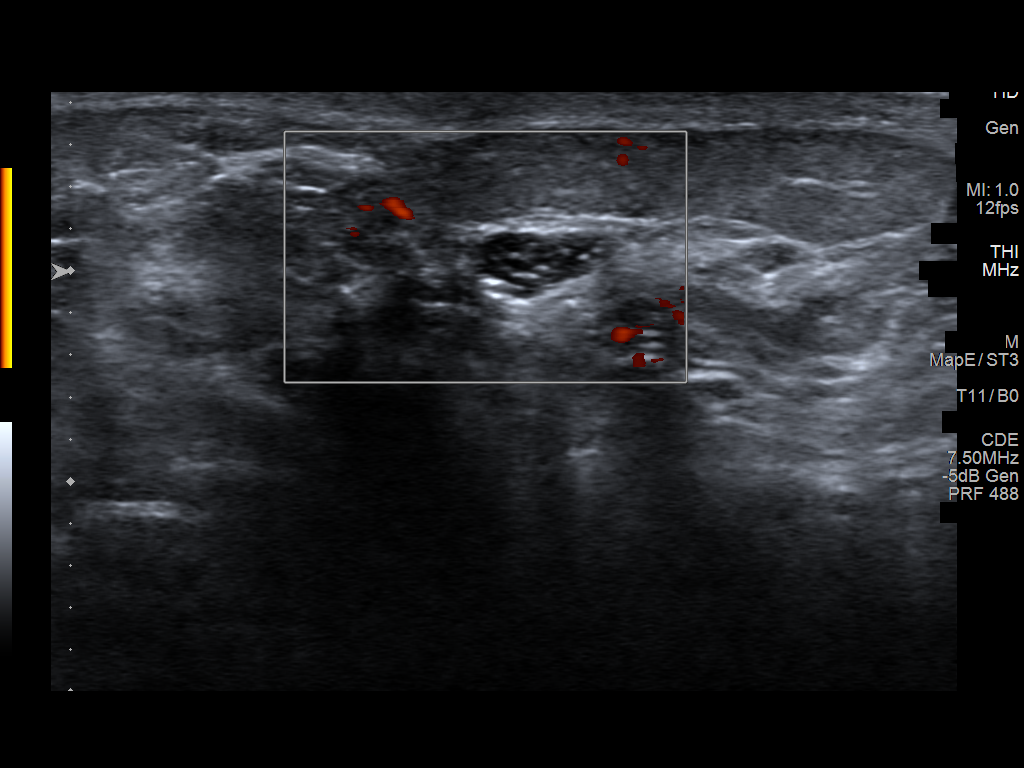

[5 of 5 positions shown; findings below may reference images not displayed]

ACR Breast Density Category b: There are scattered areas of
fibroglandular density.
FINDINGS: In the lower-inner quadrant of the left breast there is a possible
small obscured mass.

Mammographic images were processed with CAD.

Ultrasound targeted to the lower-inner quadrant of the left breast
at 8 o'clock, 2 cm from the nipple demonstrates a circumscribed
hypoechoic oval mass measuring 6 x 3 x 5 mm. No blood flow was
identified within the mass on color Doppler imaging.
IMPRESSION: There is a probably benign mass in the left breast at 8 o'clock.
This could represent fibrocystic change or a solid benign lesion
such as a fibroadenoma.

RECOMMENDATION:
Six-month follow-up left breast ultrasound is recommended.

I have discussed the findings and recommendations with the patient.
Results were also provided in writing at the conclusion of the
visit. If applicable, a reminder letter will be sent to the patient
regarding the next appointment.

BI-RADS CATEGORY  3: Probably benign.

## 2017-07-08 ENCOUNTER — Other Ambulatory Visit: Payer: Self-pay | Admitting: Internal Medicine

## 2017-07-08 DIAGNOSIS — B20 Human immunodeficiency virus [HIV] disease: Secondary | ICD-10-CM

## 2017-07-18 ENCOUNTER — Other Ambulatory Visit: Payer: Self-pay | Admitting: Orthopedic Surgery

## 2017-07-18 DIAGNOSIS — S92352A Displaced fracture of fifth metatarsal bone, left foot, initial encounter for closed fracture: Secondary | ICD-10-CM

## 2017-07-19 ENCOUNTER — Ambulatory Visit
Admission: RE | Admit: 2017-07-19 | Discharge: 2017-07-19 | Disposition: A | Discharge status: Home / Self Care | Payer: Medicare Other | Source: Ambulatory Visit | Attending: Orthopedic Surgery | Admitting: Orthopedic Surgery

## 2017-07-19 DIAGNOSIS — S92352A Displaced fracture of fifth metatarsal bone, left foot, initial encounter for closed fracture: Secondary | ICD-10-CM

## 2017-07-19 IMAGING — CT CT FOOT*L* W/O CM
1 of 4 series · 3 of 14 positions shown, 4 images · non-contrast
Comparison: None.

CLINICAL DATA: History of a twisting injury of the left foot
[DATE]. Lateral left foot pain and swelling since [DATE].
Initial encounter.

EXAM:
CT OF THE LEFT FOOT WITHOUT CONTRAST
TECHNIQUE: Multidetector CT imaging of the left foot was performed according to
the standard protocol. Multiplanar CT image reconstructions were
also generated.

[Series 4: soft tissue lower extremity · axial · 0.56mm/px · z∈[-254,-182]mm · 3 of 73 slices shown, 4 images]
[im 19/73  soft-tissue]
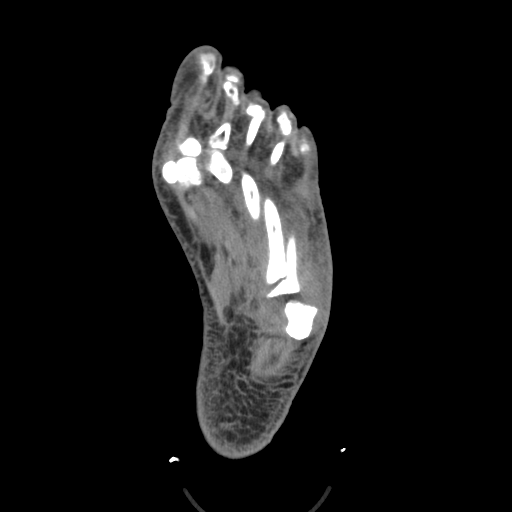
[im 19/73  bone]
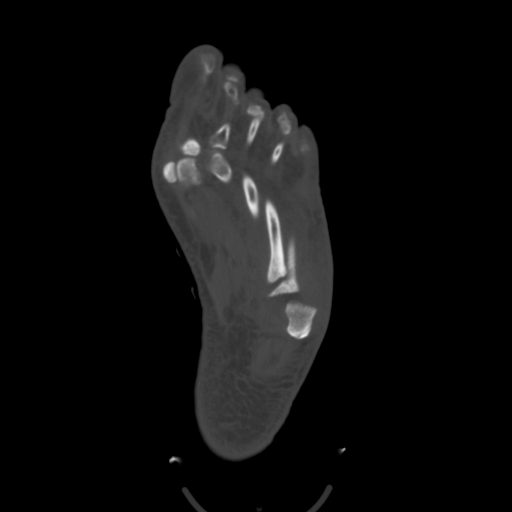
[im 37/73  bone]
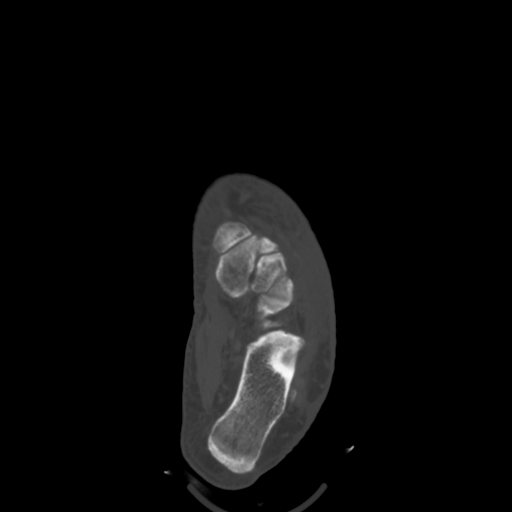
[im 55/73  bone]
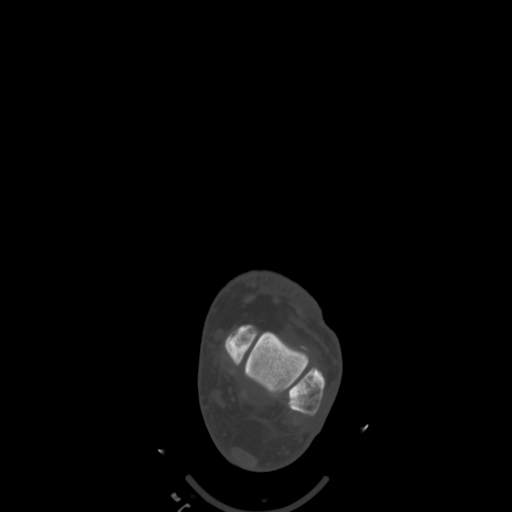

[3 of 14 positions shown; findings below may reference images not displayed]

FINDINGS: Bones/Joint/Cartilage

The patient has a mildly comminuted fracture through the lateral
base of the third metatarsal with intra-articular extension. Two
fracture fragments are identified off the superior, lateral corner
of the proximal third metatarsal. The fracture shows little to no
displacement.

There is a subtle nondisplaced fracture through the proximal
metaphysis of the fourth metatarsal. The fourth metatarsal is
medially subluxed off the cuboid approximately 0.4 cm. The lateral
margin of the fourth tarsometatarsal joint is abnormally widened.

The patient has a transverse fracture through the proximal fifth
metatarsal. The fracture is 2 cm distal to the metatarsal base. The
distal fracture fragment is inferiorly displaced 0.8 cm and fracture
fragments are distracted 0.8 cm. The proximal fracture fragment is
laterally subluxed off the cuboid.

No other fracture is identified. Small calcaneal spurs are noted. No
focal bony lesion.

Ligaments

Suboptimally assessed by CT.  The Lisfranc ligament appears intact.

Muscles and Tendons

Negative.

Soft tissues

Subcutaneous edema is present over the dorsum of the foot.
IMPRESSION: Fractures of the proximal third, fourth and fifth metatarsals. There
is disruption of the fourth and fifth tarsometatarsal joints as
described above.

Soft tissue swelling.

3-dimensional CT images were rendered by post-processing of the
original CT data at independent workstation. The 3-dimensional CT
images were interpreted, and findings were reported in the
accompanying complete CT report for this study.

## 2017-07-23 ENCOUNTER — Other Ambulatory Visit: Payer: Self-pay | Admitting: *Deleted

## 2017-07-23 DIAGNOSIS — B20 Human immunodeficiency virus [HIV] disease: Secondary | ICD-10-CM

## 2017-07-23 MED ORDER — ELVITEG-COBIC-EMTRICIT-TENOFAF 150-150-200-10 MG PO TABS: 1.0000 | ORAL_TABLET | Freq: Every day | ORAL | 6 refills | Status: DC

## 2017-07-27 ENCOUNTER — Encounter (HOSPITAL_BASED_OUTPATIENT_CLINIC_OR_DEPARTMENT_OTHER): Payer: Self-pay | Admitting: *Deleted

## 2017-07-27 ENCOUNTER — Other Ambulatory Visit: Payer: Self-pay | Admitting: Orthopedic Surgery

## 2017-08-01 ENCOUNTER — Other Ambulatory Visit: Payer: Self-pay

## 2017-08-01 ENCOUNTER — Encounter (HOSPITAL_BASED_OUTPATIENT_CLINIC_OR_DEPARTMENT_OTHER)
Admission: RE | Admit: 2017-08-01 | Discharge: 2017-08-01 | Disposition: A | Discharge status: Home / Self Care | Payer: Medicare Other | Source: Ambulatory Visit | Attending: Orthopedic Surgery | Admitting: Orthopedic Surgery

## 2017-08-01 DIAGNOSIS — N182 Chronic kidney disease, stage 2 (mild): Secondary | ICD-10-CM | POA: Diagnosis not present

## 2017-08-01 DIAGNOSIS — Z21 Asymptomatic human immunodeficiency virus [HIV] infection status: Secondary | ICD-10-CM | POA: Diagnosis not present

## 2017-08-01 DIAGNOSIS — Z79891 Long term (current) use of opiate analgesic: Secondary | ICD-10-CM | POA: Diagnosis not present

## 2017-08-01 DIAGNOSIS — M1990 Unspecified osteoarthritis, unspecified site: Secondary | ICD-10-CM | POA: Diagnosis not present

## 2017-08-01 DIAGNOSIS — Z794 Long term (current) use of insulin: Secondary | ICD-10-CM | POA: Diagnosis not present

## 2017-08-01 DIAGNOSIS — E1122 Type 2 diabetes mellitus with diabetic chronic kidney disease: Secondary | ICD-10-CM | POA: Diagnosis not present

## 2017-08-01 DIAGNOSIS — S92352A Displaced fracture of fifth metatarsal bone, left foot, initial encounter for closed fracture: Secondary | ICD-10-CM | POA: Diagnosis present

## 2017-08-01 DIAGNOSIS — Y9289 Other specified places as the place of occurrence of the external cause: Secondary | ICD-10-CM | POA: Diagnosis not present

## 2017-08-01 DIAGNOSIS — Z882 Allergy status to sulfonamides status: Secondary | ICD-10-CM | POA: Diagnosis not present

## 2017-08-01 DIAGNOSIS — X58XXXA Exposure to other specified factors, initial encounter: Secondary | ICD-10-CM | POA: Diagnosis not present

## 2017-08-01 DIAGNOSIS — K219 Gastro-esophageal reflux disease without esophagitis: Secondary | ICD-10-CM | POA: Diagnosis not present

## 2017-08-01 DIAGNOSIS — Z79899 Other long term (current) drug therapy: Secondary | ICD-10-CM | POA: Diagnosis not present

## 2017-08-01 DIAGNOSIS — I129 Hypertensive chronic kidney disease with stage 1 through stage 4 chronic kidney disease, or unspecified chronic kidney disease: Secondary | ICD-10-CM | POA: Diagnosis not present

## 2017-08-01 LAB — BASIC METABOLIC PANEL
Anion gap: 11 (ref 5–15)
BUN: 15 mg/dL (ref 6–20)
CO2: 21 mmol/L — ABNORMAL LOW (ref 22–32)
Calcium: 9.1 mg/dL (ref 8.9–10.3)
Chloride: 108 mmol/L (ref 101–111)
Creatinine, Ser: 1.06 mg/dL — ABNORMAL HIGH (ref 0.44–1.00)
GFR calc Af Amer: >60 mL/min (ref >60)
GFR calc non Af Amer: >60 mL/min (ref >60)
Glucose, Bld: 109 mg/dL — ABNORMAL HIGH (ref 65–99)
Potassium: 3.7 mmol/L (ref 3.5–5.1)
Sodium: 140 mmol/L (ref 135–145)

## 2017-08-02 ENCOUNTER — Ambulatory Visit (HOSPITAL_BASED_OUTPATIENT_CLINIC_OR_DEPARTMENT_OTHER): Payer: Medicare Other | Admitting: Certified Registered"

## 2017-08-02 ENCOUNTER — Ambulatory Visit (HOSPITAL_BASED_OUTPATIENT_CLINIC_OR_DEPARTMENT_OTHER)
Admission: RE | Admit: 2017-08-02 | Discharge: 2017-08-02 | Disposition: A | Discharge status: Home / Self Care | Payer: Medicare Other | Source: Ambulatory Visit | Attending: Orthopedic Surgery | Admitting: Orthopedic Surgery

## 2017-08-02 ENCOUNTER — Encounter (HOSPITAL_BASED_OUTPATIENT_CLINIC_OR_DEPARTMENT_OTHER)
Admission: RE | Disposition: A | Discharge status: Home / Self Care | Payer: Self-pay | Source: Ambulatory Visit | Attending: Orthopedic Surgery

## 2017-08-02 ENCOUNTER — Encounter (HOSPITAL_BASED_OUTPATIENT_CLINIC_OR_DEPARTMENT_OTHER): Payer: Self-pay

## 2017-08-02 DIAGNOSIS — Z21 Asymptomatic human immunodeficiency virus [HIV] infection status: Secondary | ICD-10-CM | POA: Diagnosis not present

## 2017-08-02 DIAGNOSIS — S92352A Displaced fracture of fifth metatarsal bone, left foot, initial encounter for closed fracture: Secondary | ICD-10-CM | POA: Insufficient documentation

## 2017-08-02 DIAGNOSIS — E1122 Type 2 diabetes mellitus with diabetic chronic kidney disease: Secondary | ICD-10-CM | POA: Insufficient documentation

## 2017-08-02 DIAGNOSIS — M1990 Unspecified osteoarthritis, unspecified site: Secondary | ICD-10-CM | POA: Insufficient documentation

## 2017-08-02 DIAGNOSIS — Z79891 Long term (current) use of opiate analgesic: Secondary | ICD-10-CM | POA: Insufficient documentation

## 2017-08-02 DIAGNOSIS — Z882 Allergy status to sulfonamides status: Secondary | ICD-10-CM | POA: Insufficient documentation

## 2017-08-02 DIAGNOSIS — X58XXXA Exposure to other specified factors, initial encounter: Secondary | ICD-10-CM | POA: Insufficient documentation

## 2017-08-02 DIAGNOSIS — I129 Hypertensive chronic kidney disease with stage 1 through stage 4 chronic kidney disease, or unspecified chronic kidney disease: Secondary | ICD-10-CM | POA: Insufficient documentation

## 2017-08-02 DIAGNOSIS — Z794 Long term (current) use of insulin: Secondary | ICD-10-CM | POA: Insufficient documentation

## 2017-08-02 DIAGNOSIS — Y9289 Other specified places as the place of occurrence of the external cause: Secondary | ICD-10-CM | POA: Insufficient documentation

## 2017-08-02 DIAGNOSIS — K219 Gastro-esophageal reflux disease without esophagitis: Secondary | ICD-10-CM | POA: Insufficient documentation

## 2017-08-02 DIAGNOSIS — Z79899 Other long term (current) drug therapy: Secondary | ICD-10-CM | POA: Insufficient documentation

## 2017-08-02 DIAGNOSIS — N182 Chronic kidney disease, stage 2 (mild): Secondary | ICD-10-CM | POA: Insufficient documentation

## 2017-08-02 HISTORY — DX: Unspecified asthma, uncomplicated: J45.909

## 2017-08-02 HISTORY — PX: ORIF TOE FRACTURE: SHX5032

## 2017-08-02 LAB — GLUCOSE, CAPILLARY
Glucose-Capillary: 145 mg/dL — ABNORMAL HIGH (ref 65–99)
Glucose-Capillary: 154 mg/dL — ABNORMAL HIGH (ref 65–99)

## 2017-08-02 SURGERY — OPEN REDUCTION INTERNAL FIXATION (ORIF) METATARSAL (TOE) FRACTURE
Anesthesia: General | Site: Foot | Laterality: Left

## 2017-08-02 MED ORDER — CEFAZOLIN SODIUM-DEXTROSE 2-4 GM/100ML-% IV SOLN
2.0000 g | INTRAVENOUS | Status: AC
  Administered 2017-08-02: 2 g via INTRAVENOUS

## 2017-08-02 MED ORDER — ASPIRIN EC 81 MG PO TBEC: 81.0000 mg | DELAYED_RELEASE_TABLET | Freq: Two times a day (BID) | ORAL | 0 refills | Status: DC

## 2017-08-02 MED ORDER — BUPIVACAINE-EPINEPHRINE (PF) 0.5% -1:200000 IJ SOLN: INTRAMUSCULAR | Status: DC | PRN

## 2017-08-02 MED ORDER — LIDOCAINE HCL (CARDIAC) 20 MG/ML IV SOLN
INTRAVENOUS | Status: DC | PRN
  Administered 2017-08-02: 30 mg via INTRAVENOUS

## 2017-08-02 MED ORDER — MIDAZOLAM HCL 2 MG/2ML IJ SOLN
1.0000 mg | INTRAMUSCULAR | Status: DC | PRN
  Administered 2017-08-02: 1 mg via INTRAVENOUS

## 2017-08-02 MED ORDER — SCOPOLAMINE 1 MG/3DAYS TD PT72: 1.0000 | MEDICATED_PATCH | Freq: Once | TRANSDERMAL | Status: DC | PRN

## 2017-08-02 MED ORDER — FENTANYL CITRATE (PF) 100 MCG/2ML IJ SOLN
50.0000 ug | INTRAMUSCULAR | Status: DC | PRN
  Administered 2017-08-02: 50 ug via INTRAVENOUS

## 2017-08-02 MED ORDER — CEFAZOLIN SODIUM-DEXTROSE 2-4 GM/100ML-% IV SOLN
INTRAVENOUS | Status: AC
  Filled 2017-08-02: qty 100

## 2017-08-02 MED ORDER — ROPIVACAINE HCL 7.5 MG/ML IJ SOLN: INTRAMUSCULAR | Status: DC | PRN

## 2017-08-02 MED ORDER — PROPOFOL 10 MG/ML IV BOLUS
INTRAVENOUS | Status: DC | PRN
  Administered 2017-08-02: 200 mg via INTRAVENOUS

## 2017-08-02 MED ORDER — BUPIVACAINE-EPINEPHRINE (PF) 0.5% -1:200000 IJ SOLN
INTRAMUSCULAR | Status: DC | PRN
  Administered 2017-08-02: 30 mL via PERINEURAL

## 2017-08-02 MED ORDER — CHLORHEXIDINE GLUCONATE 4 % EX LIQD: 60.0000 mL | Freq: Once | CUTANEOUS | Status: DC

## 2017-08-02 MED ORDER — DEXAMETHASONE SODIUM PHOSPHATE 10 MG/ML IJ SOLN
INTRAMUSCULAR | Status: DC | PRN
  Administered 2017-08-02: 4 mg via INTRAVENOUS

## 2017-08-02 MED ORDER — FENTANYL CITRATE (PF) 100 MCG/2ML IJ SOLN
INTRAMUSCULAR | Status: AC
  Filled 2017-08-02: qty 2

## 2017-08-02 MED ORDER — LACTATED RINGERS IV SOLN
INTRAVENOUS | Status: DC
  Administered 2017-08-02 (×2): via INTRAVENOUS

## 2017-08-02 MED ORDER — 0.9 % SODIUM CHLORIDE (POUR BTL) OPTIME
TOPICAL | Status: DC | PRN
  Administered 2017-08-02: 200 mL

## 2017-08-02 MED ORDER — OXYCODONE HCL 5 MG PO TABS: 5.0000 mg | ORAL_TABLET | ORAL | 0 refills | Status: DC | PRN

## 2017-08-02 MED ORDER — MIDAZOLAM HCL 2 MG/2ML IJ SOLN
INTRAMUSCULAR | Status: AC
  Filled 2017-08-02: qty 2

## 2017-08-02 MED ORDER — DOCUSATE SODIUM 100 MG PO CAPS: 100.0000 mg | ORAL_CAPSULE | Freq: Two times a day (BID) | ORAL | 0 refills | Status: DC

## 2017-08-02 MED ORDER — SODIUM CHLORIDE 0.9 % IV SOLN: INTRAVENOUS | Status: DC

## 2017-08-02 MED ORDER — SENNA 8.6 MG PO TABS: 2.0000 | ORAL_TABLET | Freq: Two times a day (BID) | ORAL | 0 refills | Status: DC

## 2017-08-02 MED ORDER — VANCOMYCIN HCL 500 MG IV SOLR
INTRAVENOUS | Status: DC | PRN
  Administered 2017-08-02: 500 mg via TOPICAL

## 2017-08-02 MED ORDER — ONDANSETRON HCL 4 MG/2ML IJ SOLN
INTRAMUSCULAR | Status: DC | PRN
  Administered 2017-08-02: 4 mg via INTRAVENOUS

## 2017-08-02 SURGICAL SUPPLY — 79 items
BANDAGE ESMARK 6X9 LF (GAUZE/BANDAGES/DRESSINGS) IMPLANT
BIT DRILL 1.7 (BIT) ×3 IMPLANT
BIT DRILL 1.7 LOW PROFILE (BIT) ×3 IMPLANT
BLADE SURG 15 STRL LF DISP TIS (BLADE) ×2 IMPLANT
BLADE SURG 15 STRL SS (BLADE) ×4
BNDG COHESIVE 4X5 TAN STRL (GAUZE/BANDAGES/DRESSINGS) ×3 IMPLANT
BNDG COHESIVE 6X5 TAN STRL LF (GAUZE/BANDAGES/DRESSINGS) ×3 IMPLANT
BNDG CONFORM 2 STRL LF (GAUZE/BANDAGES/DRESSINGS) IMPLANT
BNDG ESMARK 4X9 LF (GAUZE/BANDAGES/DRESSINGS) IMPLANT
BNDG ESMARK 6X9 LF (GAUZE/BANDAGES/DRESSINGS)
BONE MATRIX DEMINERALIZED 1CC (Bone Implant) ×3 IMPLANT
CANISTER SUCT 1200ML W/VALVE (MISCELLANEOUS) ×3 IMPLANT
CHLORAPREP W/TINT 26ML (MISCELLANEOUS) ×3 IMPLANT
COVER BACK TABLE 60X90IN (DRAPES) ×3 IMPLANT
CUFF TOURNIQUET SINGLE 34IN LL (TOURNIQUET CUFF) ×3 IMPLANT
DECANTER SPIKE VIAL GLASS SM (MISCELLANEOUS) IMPLANT
DRAPE EXTREMITY T 121X128X90 (DRAPE) ×3 IMPLANT
DRAPE OEC MINIVIEW 54X84 (DRAPES) ×3 IMPLANT
DRAPE U-SHAPE 47X51 STRL (DRAPES) ×3 IMPLANT
DRSG MEPITEL 4X7.2 (GAUZE/BANDAGES/DRESSINGS) ×3 IMPLANT
DRSG PAD ABDOMINAL 8X10 ST (GAUZE/BANDAGES/DRESSINGS) ×3 IMPLANT
ELECT REM PT RETURN 9FT ADLT (ELECTROSURGICAL) ×3
ELECTRODE REM PT RTRN 9FT ADLT (ELECTROSURGICAL) ×1 IMPLANT
GAUZE SPONGE 4X4 12PLY STRL (GAUZE/BANDAGES/DRESSINGS) ×3 IMPLANT
GLOVE BIO SURGEON STRL SZ 6.5 (GLOVE) ×2 IMPLANT
GLOVE BIO SURGEON STRL SZ8 (GLOVE) ×3 IMPLANT
GLOVE BIO SURGEONS STRL SZ 6.5 (GLOVE) ×1
GLOVE BIOGEL M STRL SZ7.5 (GLOVE) ×3 IMPLANT
GLOVE BIOGEL PI IND STRL 7.0 (GLOVE) ×1 IMPLANT
GLOVE BIOGEL PI IND STRL 8 (GLOVE) ×3 IMPLANT
GLOVE BIOGEL PI INDICATOR 7.0 (GLOVE) ×2
GLOVE BIOGEL PI INDICATOR 8 (GLOVE) ×6
GLOVE ECLIPSE 8.0 STRL XLNG CF (GLOVE) ×3 IMPLANT
GOWN STRL REUS W/ TWL LRG LVL3 (GOWN DISPOSABLE) ×1 IMPLANT
GOWN STRL REUS W/ TWL XL LVL3 (GOWN DISPOSABLE) ×3 IMPLANT
GOWN STRL REUS W/TWL LRG LVL3 (GOWN DISPOSABLE) ×2
GOWN STRL REUS W/TWL XL LVL3 (GOWN DISPOSABLE) ×6
GUIDEWIRE 1.6 (WIRE) ×2
GUIDEWIRE ORTH 157X1.6XTROC (WIRE) ×1 IMPLANT
K-WIRE BB-TAK (WIRE) ×3
KWIRE BB-TAK (WIRE) ×1 IMPLANT
NEEDLE HYPO 22GX1.5 SAFETY (NEEDLE) IMPLANT
NS IRRIG 1000ML POUR BTL (IV SOLUTION) ×3 IMPLANT
PACK BASIN DAY SURGERY FS (CUSTOM PROCEDURE TRAY) ×3 IMPLANT
PAD CAST 4YDX4 CTTN HI CHSV (CAST SUPPLIES) ×1 IMPLANT
PADDING CAST ABS 4INX4YD NS (CAST SUPPLIES)
PADDING CAST ABS COTTON 4X4 ST (CAST SUPPLIES) IMPLANT
PADDING CAST COTTON 4X4 STRL (CAST SUPPLIES) ×2
PADDING CAST COTTON 6X4 STRL (CAST SUPPLIES) ×3 IMPLANT
PENCIL BUTTON HOLSTER BLD 10FT (ELECTRODE) ×3 IMPLANT
PLATE 5TH METATARSAL HOOK (Plate) ×3 IMPLANT
SANITIZER HAND PURELL 535ML FO (MISCELLANEOUS) ×3 IMPLANT
SCREW LOCK 14X2.4XSTVA (Screw) ×1 IMPLANT
SCREW LOCKING 2.4X14 (Screw) ×2 IMPLANT
SCREW LP CORTEX 2.4X20MM (Screw) ×3 IMPLANT
SCREW VAL TI 2.4X12MM (Screw) ×3 IMPLANT
SCREW VAL TI 2.4X18 (Screw) ×6 IMPLANT
SCREW VAR TI 2.4X20 (Screw) ×6 IMPLANT
SHEET MEDIUM DRAPE 40X70 STRL (DRAPES) ×3 IMPLANT
SLEEVE SCD COMPRESS KNEE MED (MISCELLANEOUS) ×3 IMPLANT
SPLINT FAST PLASTER 5X30 (CAST SUPPLIES) ×40
SPLINT PLASTER CAST FAST 5X30 (CAST SUPPLIES) ×20 IMPLANT
SPONGE LAP 18X18 X RAY DECT (DISPOSABLE) ×3 IMPLANT
STOCKINETTE 6  STRL (DRAPES) ×2
STOCKINETTE 6 STRL (DRAPES) ×1 IMPLANT
SUCTION FRAZIER HANDLE 10FR (MISCELLANEOUS) ×2
SUCTION TUBE FRAZIER 10FR DISP (MISCELLANEOUS) ×1 IMPLANT
SUT ETHILON 3 0 PS 1 (SUTURE) ×6 IMPLANT
SUT FIBERWIRE #2 38 T-5 BLUE (SUTURE)
SUT MNCRL AB 3-0 PS2 18 (SUTURE) ×3 IMPLANT
SUT VIC AB 0 SH 27 (SUTURE) IMPLANT
SUT VIC AB 2-0 SH 27 (SUTURE) ×2
SUT VIC AB 2-0 SH 27XBRD (SUTURE) ×1 IMPLANT
SUTURE FIBERWR #2 38 T-5 BLUE (SUTURE) IMPLANT
SYR BULB 3OZ (MISCELLANEOUS) ×3 IMPLANT
SYR CONTROL 10ML LL (SYRINGE) IMPLANT
TOWEL OR 17X24 6PK STRL BLUE (TOWEL DISPOSABLE) ×6 IMPLANT
TUBE CONNECTING 20'X1/4 (TUBING) ×1
TUBE CONNECTING 20X1/4 (TUBING) ×2 IMPLANT

## 2017-08-02 NOTE — Transfer of Care (Signed)
Immediate Anesthesia Transfer of Care Note  Patient: Katrina Wolfe  Procedure(s) Performed: Procedure(s): OPEN REDUCTION INTERNAL FIXATION (ORIF) FIFTH METATARSAL (TOE) BASE FRACTURE (Left)  Patient Location: PACU  Anesthesia Type:GA combined with regional for post-op pain  Level of Consciousness: awake, alert , oriented and patient cooperative  Airway & Oxygen Therapy: Patient Spontanous Breathing and Patient connected to face mask oxygen  Post-op Assessment: Report given to RN and Post -op Vital signs reviewed and stable  Post vital signs: Reviewed and stable  Last Vitals:  Vitals:   08/02/17 1100 08/02/17 1105  BP:  (!) 155/88  Pulse: 77 75  Resp: 15 15  Temp:    SpO2: 100% 100%    Last Pain:  Vitals:   08/02/17 1005  TempSrc: Oral  PainSc: 3       Patients Stated Pain Goal: 1 (13/64/38 3779)  Complications: No apparent anesthesia complications

## 2017-08-02 NOTE — Progress Notes (Signed)
Assisted Dr. Gifford Shave with left, ultrasound guided, popliteal block. Side rails up, monitors on throughout procedure. See vital signs in flow sheet. Tolerated Procedure well.

## 2017-08-02 NOTE — Anesthesia Postprocedure Evaluation (Signed)
Anesthesia Post Note  Patient: Katrina Wolfe  Procedure(s) Performed: Procedure(s) (LRB): OPEN REDUCTION INTERNAL FIXATION (ORIF) FIFTH METATARSAL (TOE) BASE FRACTURE (Left)     Patient location during evaluation: PACU Anesthesia Type: General Level of consciousness: awake and alert Pain management: pain level controlled Vital Signs Assessment: post-procedure vital signs reviewed and stable Respiratory status: spontaneous breathing, nonlabored ventilation and respiratory function stable Cardiovascular status: blood pressure returned to baseline and stable Postop Assessment: no signs of nausea or vomiting Anesthetic complications: no    Last Vitals:  Vitals:   08/02/17 1315 08/02/17 1336  BP: (!) 150/88 (!) 167/88  Pulse: 75 72  Resp: 18 18  Temp:  36.5 C  SpO2: 95% 94%    Last Pain:  Vitals:   08/02/17 1336  TempSrc:   PainSc: 0-No pain                 Catalina Gravel

## 2017-08-02 NOTE — Op Note (Signed)
08/02/2017  12:52 PM  PATIENT:  Katrina Wolfe  51 y.o. female  PRE-OPERATIVE DIAGNOSIS:  Left 5th metatarsal base fracture  POST-OPERATIVE DIAGNOSIS:  Left 5th metatarsal base fracture  Procedure(s): 1.  Open treatment of left 5th MT fracture with internal fixation 2.  AP, oblique and lateral xrays of the left  SURGEON:  Wylene Simmer, MD  ASSISTANT: Mechele Claude, PA-C  ANESTHESIA:   General, regional  EBL:  minimal   TOURNIQUET:   Total Tourniquet Time Documented: Thigh (Left) - 58 minutes Total: Thigh (Left) - 58 minutes  COMPLICATIONS:  None apparent  DISPOSITION:  Extubated, awake and stable to recovery.  Indications for procedure:  The patient is a 51 year old female with past medical history significant for HIV and diabetes. She sustained an injury to her left foot about 6 weeks ago. X-rays reveal a displaced fracture of the fifth metatarsal base. This is a Jones fracture that is significantly displaced. She presents today for operative treatment of this injury.  The risks and benefits of the alternative treatment options have been discussed in detail.  The patient wishes to proceed with surgery and specifically understands risks of bleeding, infection, nerve damage, blood clots, need for additional surgery, amputation and death.   PROCEDURE IN DETAIL:After pre operative consent was obtained, and the correct operative site was identified, the patient was brought to the operating room and placed supine on the OR table.  Anesthesia was administered.  Pre-operative antibiotics were administered.  A surgical timeout was taken.  The left lower extremity was then prepped and draped in standard sterile fashion with a tourniquet around the thigh. The extremity was exsanguinated and the tourniquet was inflated to 250 mmHg. A longitudinal incision was made over the fifth metatarsal base and extended proximally along the course of the peroneals. Dissection was carried down through the  skin and subcutaneous tissues. Fracture site was identified. It was cleaned of all fibrous tissue and hematoma. The fracture was mobilized and reduced. It was provisionally pinned. An Arthrex fifth metatarsal hook plate was selected. It was placed over the base of the fifth metatarsal and impacted into position. It was secured at the proximal fragment with 3 locking screws. Fracture was then compressed. The distal oblong hole was drilled and the screw inserted in order to compress the fracture. The remaining holes were drilled and filled with bicortical locking screws. AP oblique and lateral ray grafts showed appropriate reduction of the fracture and appropriate position and length of the hardware. Demineralized bone matrix and cancellous chips were packed into the defect plantarly and laterally. The wound was irrigated copiously. Think most powder was pickled and the wound. Deep subcutaneous changes tissues were approximated with inverted simple sutures of 2-0 Vicryl. Skin incision was closed with horizontal mattress sutures of 3-0 nylon. Sterile dressings were applied followed by a well-padded short leg splint. Tourniquet was released after a patient of the dressings. Patient was awakened from anesthesia and transported to the recovery room in stable condition.   FOLLOW UP PLAN: Nonweightbearing on the left lower extremity. Follow-up with me in the office in 2 weeks for suture removal and conversion to a cast. Plan nonweightbearing for 6 weeks total. Aspirin 81 mg by mouth twice a day for DVT prophylaxis.   RADIOGRAPHS: AP oblique and lateral ray grafts left foot are obtained intraoperatively. These show interval reduction and fixation of the fifth metatarsal base fracture. Hardware is appropriate position and of the appropriate lengths. No other acute injuries are noted.  Mechele Claude PA-C was present and scrubbed for the duration of the operative case. His assistance assistance was essential in  positioning the patient, prepping and draping, gaining maintaining exposure, performing the operation, closing and dressing the wounds and applying the splint.

## 2017-08-02 NOTE — H&P (Signed)
Katrina Wolfe is an 51 y.o. female.   Chief Complaint: left foot pain HPI: 51 y/o female with PMH of HIV and diabetes injured her left foot a few weeks ago.  She has a displaced 5th MT base Jones fracture.  She presents today for operative treatment of this displaced and unstable fracture.  Past Medical History:  Diagnosis Date  . Abnormal uterine bleeding (AUB)   . Arthritis    knees  . Asthma    as child, no problems in 20 yrs  . CKD (chronic kidney disease), stage II   . Congenital cardiomegaly    per pt has always been told since childhood and siblings   . GERD (gastroesophageal reflux disease)   . History of genital warts   . HIV (human immunodeficiency virus infection) ([place])    [place]-- DR Carlyle Basques  . Hypertension   . Intermittent palpitations    mild-- no meds  . Legally blind in left eye, as defined in [place]    trauma as child  . Type 2 diabetes mellitus ([place])   . Uterine fibroid   . VIN III (vulvar intraepithelial neoplasia III)    and Verrucoid lesion on the mons  . Wears glasses     Past Surgical History:  Procedure Laterality Date  . CESAREAN SECTION  2001  &  1984  . CO2 LASER APPLICATION N/A 32/05/7123   Procedure: CO2 LASER APPLICATION AND WIDE VOCAL EXCSION OF LESION ON MONS PUBIS;  Surgeon: Marti Sleigh, MD;  Location: [facility];  Service: Gynecology;  Laterality: N/A;   CASE CANCELLED   . CO2 LASER APPLICATION N/A 5/80/9983   Procedure: CO2 LASER OF VULVAR;  Surgeon: Marti Sleigh, MD;  Location: [facility];  Service: Gynecology;  Laterality: N/A;  . CORNEAL TRANSPLANT Left 2006   failed  . D & C HYSTEROSCOPY /  RESECTION FIBROID  2004  . KNEE ARTHROSCOPY W/ ACL RECONSTRUCTION Bilateral 2008  . VULVECTOMY N/A 01/07/2016   Procedure: WIDE LOCAL EXCISION;  Surgeon: Marti Sleigh, MD;  Location: [facility];  Service: Gynecology;  Laterality: N/A;   mons pubis as site    Family History  Problem Relation Age of Onset  . Hypertension Mother   . Diabetes Father   . Cancer Brother   . Breast cancer Cousin    Social History:  reports that she has never smoked. She has never used smokeless tobacco. She reports that she does not drink alcohol or use drugs.  Allergies:  Allergies  Allergen Reactions  . Shellfish Allergy Anaphylaxis    All types shellfish  . Bactrim [Sulfamethoxazole-Trimethoprim] Hives    Medications Prior to Admission  Medication Sig Dispense Refill  . atorvastatin (LIPITOR) 10 MG tablet Take 10 mg by mouth every morning.     . calcium carbonate (TUMS - DOSED IN MG ELEMENTAL CALCIUM) 500 MG chewable tablet Chew 1 tablet by mouth as needed for indigestion or heartburn.    Marland Kitchen CALCIUM PO Take 2 tablets by mouth daily.    . Cats Claw, Uncaria tomentosa, 500 MG CAPS Take 2 capsules by mouth every morning.     . cloNIDine (CATAPRES) 0.3 MG tablet Take 0.3 mg by mouth 2 (two) times daily.     . Coconut Oil 1000 MG CAPS Take 2 capsules by mouth daily.    Marland Kitchen elvitegravir-cobicistat-emtricitabine-tenofovir (GENVOYA) 150-150-200-10 MG TABS tablet Take 1 tablet by mouth daily with breakfast. 30 tablet 6  .  HUMALOG MIX 75/25 KWIKPEN (75-25) 100 UNIT/ML Kwikpen Inject 50 Units into the skin 2 (two) times daily.     . hydrochlorothiazide (HYDRODIURIL) 25 MG tablet Take 1 tablet (25 mg total) by mouth daily. 30 tablet 0  . HYDROcodone-acetaminophen (NORCO/VICODIN) 5-325 MG tablet Take 2 tablets by mouth every 4 (four) hours as needed. 10 tablet 0  . Insulin Syringe-Needle U-100 (INSULIN SYRINGE 1CC/31GX5/16") 31G X 5/16" 1 ML MISC 1 Units by Does not apply route 2 (two) times daily. 30 each 0  . lisinopril (PRINIVIL,ZESTRIL) 10 MG tablet Take 1 tablet (10 mg total) by mouth daily. 30 tablet 0  . metFORMIN (GLUCOPHAGE) 1000 MG tablet Take 1,000 mg by mouth 2 (two) times daily with a meal.     . ONE TOUCH ULTRA TEST test strip     .  Lancets (ONETOUCH ULTRASOFT) lancets       Results for orders placed or performed during the hospital encounter of August 23, 2017 (from the past 48 hour(s))  Basic metabolic panel     Status: Abnormal   Collection Time: 08/01/17  8:30 AM  Result Value Ref Range   Sodium 140 135 - 145 mmol/L   Potassium 3.7 3.5 - 5.1 mmol/L   Chloride 108 101 - 111 mmol/L   CO2 21 (L) 22 - 32 mmol/L   Glucose, Bld 109 (H) 65 - 99 mg/dL   BUN 15 6 - 20 mg/dL   Creatinine, Ser 1.06 (H) 0.44 - 1.00 mg/dL   Calcium 9.1 8.9 - 10.3 mg/dL   GFR calc non Af Amer >60 >60 mL/min   GFR calc Af Amer >60 >60 mL/min    Comment: (NOTE) The eGFR has been calculated using the CKD EPI equation. This calculation has not been validated in all clinical situations. eGFR's persistently <60 mL/min signify possible Chronic Kidney Disease.    Anion gap 11 5 - 15  Glucose, capillary     Status: Abnormal   Collection Time: Aug 23, 2017 11:04 AM  Result Value Ref Range   Glucose-Capillary 145 (H) 65 - 99 mg/dL   No results found.  ROS  No recent f/c/n/v/wt loss  Blood pressure 132/68, pulse 71, temperature 97.9 F (36.6 C), temperature source Oral, resp. rate 18, height 5' 9" (1.753 m), weight 122.5 kg (270 lb), last menstrual period 05/07/2015, SpO2 100 %. Physical Exam  wn wd woman in nad  A and O x 4.  Mood and affect normal.  EOMI.  resp unlabored.  L foot with heatlhy skin.  No lymphadenopathy.  5/5 strength in PF and DF of the ankle and toes.  Sens to LT intact at the foot laterally.  Brisk cap refill at the toes.  TTP at the 5th MT base.   Assessment/Plan L 5th MT base Jones fracture (closed, displaced) - to OR for operative treatment.  The risks and benefits of the alternative treatment options have been discussed in detail.  The patient wishes to proceed with surgery and specifically understands risks of bleeding, infection, nerve damage, blood clots, need for additional surgery, amputation and death.   Wylene Simmer,  MD 23-Aug-2017, 11:06 AM

## 2017-08-02 NOTE — Discharge Instructions (Addendum)
Wylene Simmer, MD [place]  Please read the following information regarding your care after surgery.  Medications  You only need a prescription for the narcotic pain medicine (ex. oxycodone, Percocet, Norco).  All of the other medicines listed below are available over the counter. X acetominophen (Tylenol) 650 mg every 4-6 hours as you need for minor pain X oxycodone as prescribed for moderate to severe pain X Aleve 2 pills twice a day for the first 3 days after surgery.  Narcotic pain medicine (ex. oxycodone, Percocet, Vicodin) will cause constipation.  To prevent this problem, take the following medicines while you are taking any pain medicine. X docusate sodium (Colace) 100 mg twice a day X senna (Senokot) 2 tablets twice a day  X To help prevent blood clots, take a baby aspirin (81 mg) twice a day after surgery.  You should also get up every hour while you are awake to move around.    Weight Bearing X Do not bear any weight on the operated leg or foot.  Cast / Splint / Dressing X Keep your splint, cast or dressing clean and dry.  Dont put anything (coat hanger, pencil, etc) down inside of it.  If it gets damp, use a hair dryer on the cool setting to dry it.  If it gets soaked, call the office to schedule an appointment for a cast change.   After your dressing, cast or splint is removed; you may shower, but do not soak or scrub the wound.  Allow the water to run over it, and then gently pat it dry.  Swelling It is normal for you to have swelling where you had surgery.  To reduce swelling and pain, keep your toes above your nose for at least 3 days after surgery.  It may be necessary to keep your foot or leg elevated for several weeks.  If it hurts, it should be elevated.  Follow Up Call my office at [phone] when you are discharged from the hospital or surgery center to schedule an appointment to be seen two weeks after surgery.  Call my office at [phone] if  you develop a fever >101.5 F, nausea, vomiting, bleeding from the surgical site or severe pain.     Post Anesthesia Home Care Instructions  Activity: Get plenty of rest for the remainder of the day. A responsible individual must stay with you for 24 hours following the procedure.  For the next 24 hours, DO NOT: -Drive a car -Paediatric nurse -Drink alcoholic beverages -Take any medication unless instructed by your physician -Make any legal decisions or sign important papers.  Meals: Start with liquid foods such as gelatin or soup. Progress to regular foods as tolerated. Avoid greasy, spicy, heavy foods. If nausea and/or vomiting occur, drink only clear liquids until the nausea and/or vomiting subsides. Call your physician if vomiting continues.  Special Instructions/Symptoms: Your throat may feel dry or sore from the anesthesia or the breathing tube placed in your throat during surgery. If this causes discomfort, gargle with warm salt water. The discomfort should disappear within 24 hours.  If you had a scopolamine patch placed behind your ear for the management of post- operative nausea and/or vomiting:  1. The medication in the patch is effective for 72 hours, after which it should be removed.  Wrap patch in a tissue and discard in the trash. Wash hands thoroughly with soap and water. 2. You may remove the patch earlier than 72 hours if you experience unpleasant side  effects which may include dry mouth, dizziness or visual disturbances. 3. Avoid touching the patch. Wash your hands with soap and water after contact with the patch.

## 2017-08-02 NOTE — Anesthesia Preprocedure Evaluation (Signed)
Anesthesia Evaluation  Patient identified by MRN, date of birth, ID band Patient awake    Reviewed: Allergy & Precautions, NPO status , Patient's Chart, lab work & pertinent test results  Airway Mallampati: II  TM Distance: >3 FB Neck ROM: Full    Dental  (+) Teeth Intact, Dental Advisory Given   Pulmonary asthma ,    Pulmonary exam normal breath sounds clear to auscultation       Cardiovascular hypertension, Pt. on medications Normal cardiovascular exam Rhythm:Regular Rate:Normal  Congenital cardiomegaly   Neuro/Psych Blind in left eye  negative psych ROS   GI/Hepatic negative GI ROS,   Endo/Other  diabetes, Type 2, Oral Hypoglycemic AgentsMorbid obesity  Renal/GU Renal InsufficiencyRenal disease     Musculoskeletal negative musculoskeletal ROS (+)   Abdominal   Peds  Hematology  (+) HIV,   Anesthesia Other Findings Day of surgery medications reviewed with the patient.  Reproductive/Obstetrics                             Anesthesia Physical Anesthesia Plan  ASA: III  Anesthesia Plan: General   Post-op Pain Management:  Regional for Post-op pain   Induction: Intravenous  PONV Risk Score and Plan: 3 and Ondansetron, Dexamethasone and Midazolam  Airway Management Planned: LMA  Additional Equipment:   Intra-op Plan:   Post-operative Plan: Extubation in OR  Informed Consent: I have reviewed the patients History and Physical, chart, labs and discussed the procedure including the risks, benefits and alternatives for the proposed anesthesia with the patient or authorized representative who has indicated his/her understanding and acceptance.   Dental advisory given  Plan Discussed with: CRNA  Anesthesia Plan Comments: (Risks/benefits of general anesthesia discussed with patient including risk of damage to teeth, lips, gum, and tongue, nausea/vomiting, allergic reactions to  medications, and the possibility of heart attack, stroke and death.  All patient questions answered.  Patient wishes to proceed.)        Anesthesia Quick Evaluation

## 2017-08-02 NOTE — Anesthesia Procedure Notes (Signed)
Procedure Name: LMA Insertion Date/Time: 08/02/2017 11:15 AM Performed by: Kandon Hosking D Pre-anesthesia Checklist: Patient identified, Emergency Drugs available, Suction available and Patient being monitored Patient Re-evaluated:Patient Re-evaluated prior to induction Oxygen Delivery Method: Circle system utilized Preoxygenation: Pre-oxygenation with 100% oxygen Induction Type: IV induction Ventilation: Mask ventilation without difficulty LMA: LMA inserted LMA Size: 4.0 Number of attempts: 1 Airway Equipment and Method: Bite block Placement Confirmation: positive ETCO2 Tube secured with: Tape Dental Injury: Teeth and Oropharynx as per pre-operative assessment

## 2017-08-02 NOTE — Anesthesia Procedure Notes (Signed)
Anesthesia Regional Block: Popliteal block   Pre-Anesthetic Checklist: ,, timeout performed, Correct Patient, Correct Site, Correct Laterality, Correct Procedure, Correct Position, site marked, Risks and benefits discussed,  Surgical consent,  Pre-op evaluation,  At surgeon's request and post-op pain management  Laterality: Left  Prep: chloraprep       Needles:  Injection technique: Single-shot  Needle Type: Echogenic Needle     Needle Length: 9cm  Needle Gauge: 21     Additional Needles:   Procedures: ultrasound guided,,,,,,,,  Narrative:  Start time: 08/02/2017 10:45 AM End time: 08/02/2017 10:55 AM Injection made incrementally with aspirations every 5 mL.  Performed by: Personally  Anesthesiologist: Catalina Gravel  Additional Notes: No pain on injection. No increased resistance to injection. Injection made in 5cc increments.  Good needle visualization.  Patient tolerated procedure well.

## 2017-08-03 ENCOUNTER — Encounter (HOSPITAL_BASED_OUTPATIENT_CLINIC_OR_DEPARTMENT_OTHER): Payer: Self-pay | Admitting: Orthopedic Surgery

## 2017-11-21 ENCOUNTER — Telehealth: Payer: Self-pay | Admitting: *Deleted

## 2017-11-21 NOTE — Telephone Encounter (Signed)
Called and moved the appt on December 10th from 9:15am to 10:30am.

## 2017-11-26 ENCOUNTER — Ambulatory Visit: Payer: Medicare Other | Admitting: Gynecology

## 2017-12-06 ENCOUNTER — Other Ambulatory Visit (HOSPITAL_COMMUNITY)
Admission: RE | Admit: 2017-12-06 | Discharge: 2017-12-06 | Disposition: A | Discharge status: Home / Self Care | Payer: Medicare Other | Source: Ambulatory Visit | Attending: Internal Medicine | Admitting: Internal Medicine

## 2017-12-06 ENCOUNTER — Other Ambulatory Visit: Payer: Medicare Other

## 2017-12-06 DIAGNOSIS — Z113 Encounter for screening for infections with a predominantly sexual mode of transmission: Secondary | ICD-10-CM | POA: Diagnosis present

## 2017-12-06 DIAGNOSIS — B20 Human immunodeficiency virus [HIV] disease: Secondary | ICD-10-CM

## 2017-12-07 LAB — COMPLETE METABOLIC PANEL WITH GFR
AG Ratio: 1.1 (calc) (ref 1.0–2.5)
ALT: 10 U/L (ref 6–29)
AST: 11 U/L (ref 10–35)
Albumin: 4 g/dL (ref 3.6–5.1)
Alkaline phosphatase (APISO): 74 U/L (ref 33–130)
BUN/Creatinine Ratio: 13 (calc) (ref 6–22)
BUN: 14 mg/dL (ref 7–25)
CO2: 22 mmol/L (ref 20–32)
Calcium: 9.5 mg/dL (ref 8.6–10.4)
Chloride: 104 mmol/L (ref 98–110)
Creat: 1.06 mg/dL — ABNORMAL HIGH (ref 0.50–1.05)
GFR, Est African American: 70 mL/min/{1.73_m2} (ref >=60)
GFR, Est Non African American: 61 mL/min/{1.73_m2} (ref >=60)
Globulin: 3.6 g/dL (calc) (ref 1.9–3.7)
Glucose, Bld: 155 mg/dL — ABNORMAL HIGH (ref 65–99)
Potassium: 4 mmol/L (ref 3.5–5.3)
Sodium: 139 mmol/L (ref 135–146)
Total Bilirubin: 0.4 mg/dL (ref 0.2–1.2)
Total Protein: 7.6 g/dL (ref 6.1–8.1)

## 2017-12-07 LAB — CBC WITH DIFFERENTIAL/PLATELET
Basophils Absolute: 11 cells/uL (ref 0–200)
Basophils Relative: 0.1 %
Eosinophils Absolute: 130 cells/uL (ref 15–500)
Eosinophils Relative: 1.2 %
HCT: 36.6 % (ref 35.0–45.0)
Hemoglobin: 12.6 g/dL (ref 11.7–15.5)
Lymphs Abs: 2408 cells/uL (ref 850–3900)
MCH: 32.1 pg (ref 27.0–33.0)
MCHC: 34.4 g/dL (ref 32.0–36.0)
MCV: 93.1 fL (ref 80.0–100.0)
MPV: 11.5 fL (ref 7.5–12.5)
Monocytes Relative: 5.8 %
Neutro Abs: 7625 cells/uL (ref 1500–7800)
Neutrophils Relative %: 70.6 %
Platelets: 329 10*3/uL (ref 140–400)
RBC: 3.93 10*6/uL (ref 3.80–5.10)
RDW: 12.6 % (ref 11.0–15.0)
Total Lymphocyte: 22.3 %
WBC mixed population: 626 cells/uL (ref 200–950)
WBC: 10.8 10*3/uL (ref 3.8–10.8)

## 2017-12-07 LAB — URINE CYTOLOGY ANCILLARY ONLY
Chlamydia: NEGATIVE
Neisseria Gonorrhea: NEGATIVE

## 2017-12-07 LAB — RPR: RPR Ser Ql: NONREACTIVE

## 2017-12-07 LAB — T-HELPER CELL (CD4) - (RCID CLINIC ONLY)
CD4 % Helper T Cell: 20 % — ABNORMAL LOW (ref 33–55)
CD4 T Cell Abs: 550 /uL (ref 400–2700)

## 2017-12-12 LAB — HIV-1 RNA QUANT-NO REFLEX-BLD
HIV 1 RNA Quant: <20 copies/mL — AB
HIV-1 RNA Quant, Log: <1.3 Log copies/mL — AB

## 2017-12-20 ENCOUNTER — Ambulatory Visit: Payer: Medicare Other | Admitting: Internal Medicine

## 2017-12-20 ENCOUNTER — Telehealth: Payer: Self-pay | Admitting: *Deleted

## 2017-12-20 NOTE — Telephone Encounter (Signed)
Called and moved the patient's appt from 1/4 to 1/11.

## 2017-12-21 ENCOUNTER — Ambulatory Visit: Payer: Medicare Other | Admitting: Gynecology

## 2017-12-28 ENCOUNTER — Inpatient Hospital Stay: Payer: Medicare Other | Attending: Gynecology | Admitting: Gynecology

## 2017-12-28 ENCOUNTER — Encounter: Payer: Self-pay | Admitting: Gynecology

## 2017-12-28 VITALS — BP 172/93 | HR 82 | Temp 98.4°F | Resp 20

## 2017-12-28 DIAGNOSIS — E1122 Type 2 diabetes mellitus with diabetic chronic kidney disease: Secondary | ICD-10-CM

## 2017-12-28 DIAGNOSIS — Z79899 Other long term (current) drug therapy: Secondary | ICD-10-CM | POA: Diagnosis not present

## 2017-12-28 DIAGNOSIS — N182 Chronic kidney disease, stage 2 (mild): Secondary | ICD-10-CM | POA: Diagnosis not present

## 2017-12-28 DIAGNOSIS — K219 Gastro-esophageal reflux disease without esophagitis: Secondary | ICD-10-CM | POA: Insufficient documentation

## 2017-12-28 DIAGNOSIS — Z7984 Long term (current) use of oral hypoglycemic drugs: Secondary | ICD-10-CM

## 2017-12-28 DIAGNOSIS — D071 Carcinoma in situ of vulva: Secondary | ICD-10-CM | POA: Insufficient documentation

## 2017-12-28 DIAGNOSIS — I129 Hypertensive chronic kidney disease with stage 1 through stage 4 chronic kidney disease, or unspecified chronic kidney disease: Secondary | ICD-10-CM | POA: Insufficient documentation

## 2017-12-28 DIAGNOSIS — Z7982 Long term (current) use of aspirin: Secondary | ICD-10-CM | POA: Insufficient documentation

## 2017-12-28 NOTE — Progress Notes (Signed)
Consult Note: Gyn-Onc   Katrina Wolfe 52 y.o. female  Chief Complaint  Patient presents with  . 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 No evidence of recurrent disease.  Plan:   The patient given information regarding symptoms associated with recurrent disease. She'll contact me she develops any new problems. Otherwise she return in 6 months.  Interval history: Patient underwent wide local excision of a verrocoid lesion of her mons on 01/07/2016. She also had extensive laser vaporization of vulvar VIN 3. Final pathology of the excisional biopsy of the mons showed VIN 3 with negative margins.   She has not noted any new lesions and has no symptoms. Specifically she is not having any pruritus. Otherwise her health has been good in the interim except for a fracture of her foot... She denies any other pelvic symptoms bleeding or discharge. She denies any GI or GU symptoms.  HPI: 52 year old African American female seen in consultation the request of Dr.Varnado regarding management of recurrent VIN 3. Patient presented with lesions on the vulva and mons which been biopsied showing both with severe dysplasia. Patient reports that she had laser vaporization of the vulva in 2010 in [place] for similar condition. She is not on any immunosuppressive therapies. Pap smears in the past of a normal period the patient is not had a menstrual period since July 2016.  She underwent wide local excision of the lesion on her mons (VIN-III) as well as laser vaporization of several lesions of the vulva on 01/07/2016.  Review of Systems:10 point review of systems is negative except as noted in interval history.   Vitals: Blood pressure (!) 172/93, pulse 82, temperature 98.4 F (36.9 C), temperature source Oral, resp. rate 20, last menstrual period 07/07/2015, SpO2 99 %.  Physical Exam: General : The patient is a healthy woman in no acute distress.  HEENT:  normocephalic, extraoccular movements normal; neck is supple without thyromegally  Lynphnodes: Supraclavicular and inguinal nodes not enlarged  Abdomen: Soft, non-tender, no ascites, no organomegally, no masses, no hernias  Pelvic:  Mons. There are no new lesions... EGBUS: Normal female .   There are no lesions noted.  All lasered areas have healed well   Lower extremities: No edema or varicosities. Normal range of motion      Allergies  Allergen Reactions  . Shellfish Allergy Anaphylaxis    All types shellfish  . Bactrim [Sulfamethoxazole-Trimethoprim] Hives    Past Medical History:  Diagnosis Date  . Abnormal uterine bleeding (AUB)   . Arthritis    knees  . Asthma    as child, no problems in 20 yrs  . CKD (chronic kidney disease), stage II   . Congenital cardiomegaly    per pt has always been told since childhood and siblings   . GERD (gastroesophageal reflux disease)   . History of genital warts   . HIV (human immunodeficiency virus infection) ([place])    [place]-- DR Carlyle Basques  . Hypertension   . Intermittent palpitations    mild-- no meds  . Legally blind in left eye, as defined in [place]    trauma as child  . Type 2 diabetes mellitus ([place])   . Uterine fibroid   . VIN III (vulvar intraepithelial neoplasia III)    and Verrucoid lesion on the mons  . Wears glasses     Past Surgical History:  Procedure Laterality Date  .  CESAREAN SECTION  2001  &  1984  . CO2 LASER APPLICATION N/A 29/04/6212   Procedure: CO2 LASER APPLICATION AND WIDE VOCAL EXCSION OF LESION ON MONS PUBIS;  Surgeon: Marti Sleigh, MD;  Location: [facility];  Service: Gynecology;  Laterality: N/A;   CASE CANCELLED   . CO2 LASER APPLICATION N/A 0/86/5784   Procedure: CO2 LASER OF VULVAR;  Surgeon: Marti Sleigh, MD;  Location: [facility];  Service: Gynecology;  Laterality: N/A;  . CORNEAL TRANSPLANT Left 2006   failed   . D & C HYSTEROSCOPY /  RESECTION FIBROID  2004  . KNEE ARTHROSCOPY W/ ACL RECONSTRUCTION Bilateral 2008  . ORIF TOE FRACTURE Left 08/02/2017   Procedure: OPEN REDUCTION INTERNAL FIXATION (ORIF) FIFTH METATARSAL (TOE) BASE FRACTURE;  Surgeon: Wylene Simmer, MD;  Location: [facility];  Service: Orthopedics;  Laterality: Left;  Marland Kitchen VULVECTOMY N/A 01/07/2016   Procedure: WIDE LOCAL EXCISION;  Surgeon: Marti Sleigh, MD;  Location: [facility];  Service: Gynecology;  Laterality: N/A;  mons pubis as site    Current Outpatient Medications  Medication Sig Dispense Refill  . aspirin EC 81 MG tablet Take 1 tablet (81 mg total) by mouth 2 (two) times daily. 84 tablet 0  . atorvastatin (LIPITOR) 10 MG tablet Take 10 mg by mouth every morning.     . calcium carbonate (TUMS - DOSED IN MG ELEMENTAL CALCIUM) 500 MG chewable tablet Chew 1 tablet by mouth as needed for indigestion or heartburn.    Marland Kitchen CALCIUM PO Take 2 tablets by mouth daily.    . Cats Claw, Uncaria tomentosa, 500 MG CAPS Take 2 capsules by mouth every morning.     . cloNIDine (CATAPRES) 0.3 MG tablet Take 0.3 mg by mouth 2 (two) times daily.     . Coconut Oil 1000 MG CAPS Take 2 capsules by mouth daily.    Marland Kitchen docusate sodium (COLACE) 100 MG capsule Take 1 capsule (100 mg total) by mouth 2 (two) times daily. While taking narcotic pain medicine. 30 capsule 0  . elvitegravir-cobicistat-emtricitabine-tenofovir (GENVOYA) 150-150-200-10 MG TABS tablet Take 1 tablet by mouth daily with breakfast. 30 tablet 6  . HUMALOG MIX 75/25 KWIKPEN (75-25) 100 UNIT/ML Kwikpen Inject 50 Units into the skin 2 (two) times daily.     . hydrochlorothiazide (HYDRODIURIL) 25 MG tablet Take 1 tablet (25 mg total) by mouth daily. 30 tablet 0  . HYDROcodone-acetaminophen (NORCO/VICODIN) 5-325 MG tablet Take 2 tablets by mouth every 4 (four) hours as needed. 10 tablet 0  . Insulin Syringe-Needle U-100 (INSULIN SYRINGE 1CC/31GX5/16") 31G X  5/16" 1 ML MISC 1 Units by Does not apply route 2 (two) times daily. 30 each 0  . Lancets (ONETOUCH ULTRASOFT) lancets     . lisinopril (PRINIVIL,ZESTRIL) 10 MG tablet Take 1 tablet (10 mg total) by mouth daily. 30 tablet 0  . metFORMIN (GLUCOPHAGE) 1000 MG tablet Take 1,000 mg by mouth 2 (two) times daily with a meal.     . ONE TOUCH ULTRA TEST test strip     . oxyCODONE (ROXICODONE) 5 MG immediate release tablet Take 1-2 tablets (5-10 mg total) by mouth every 4 (four) hours as needed for moderate pain or severe pain. For no more than 3 days. 30 tablet 0  . senna (SENOKOT) 8.6 MG TABS tablet Take 2 tablets (17.2 mg total) by mouth 2 (two) times daily. 30 each 0  . VICTOZA 18 MG/3ML SOPN      No  current facility-administered medications for this visit.     Social History   Socioeconomic History  . Marital status: Married    Spouse name: Not on file  . Number of children: Not on file  . Years of education: Not on file  . Highest education level: Not on file  Social Needs  . Financial resource strain: Not on file  . Food insecurity - worry: Not on file  . Food insecurity - inability: Not on file  . Transportation needs - medical: Not on file  . Transportation needs - non-medical: Not on file  Occupational History  . Not on file  Tobacco Use  . Smoking status: Never Smoker  . Smokeless tobacco: Never Used  Substance and Sexual Activity  . Alcohol use: No    Alcohol/week: 0.0 oz  . Drug use: No  . Sexual activity: Yes    Birth control/protection: None  Other Topics Concern  . Not on file  Social History Narrative  . Not on file    Family History  Problem Relation Age of Onset  . Hypertension Mother   . Diabetes Father   . Cancer Brother   . Breast cancer Cousin       Marti Sleigh, MD 12/28/2017, 11:58 AM

## 2017-12-28 NOTE — Patient Instructions (Signed)
Plan to follow up in six months or sooner if needed.  Please call [phone] in March or April to schedule your appointment for July 2019.

## 2018-01-04 ENCOUNTER — Ambulatory Visit
Admission: RE | Admit: 2018-01-04 | Discharge: 2018-01-04 | Disposition: A | Discharge status: Home / Self Care | Payer: Medicare Other | Source: Ambulatory Visit | Attending: Internal Medicine | Admitting: Internal Medicine

## 2018-01-04 ENCOUNTER — Other Ambulatory Visit: Payer: Self-pay | Admitting: Internal Medicine

## 2018-01-04 DIAGNOSIS — N6320 Unspecified lump in the left breast, unspecified quadrant: Secondary | ICD-10-CM

## 2018-01-04 IMAGING — US ULTRASOUND LEFT BREAST LIMITED
1 series · 4 of 4 positions shown · non-contrast
Comparison: [DATE] and earlier studies
COMPARISON: [9M] TS and earlier

CLINICAL DATA: Six-month follow-up for probably benign mass in the
left breast.

EXAM:
ULTRASOUND OF THE LEFT BREAST

[Series 1: ultrasound left breast limited · 0.05mm/px · 4 of 4 slices shown]
[im 1/4]
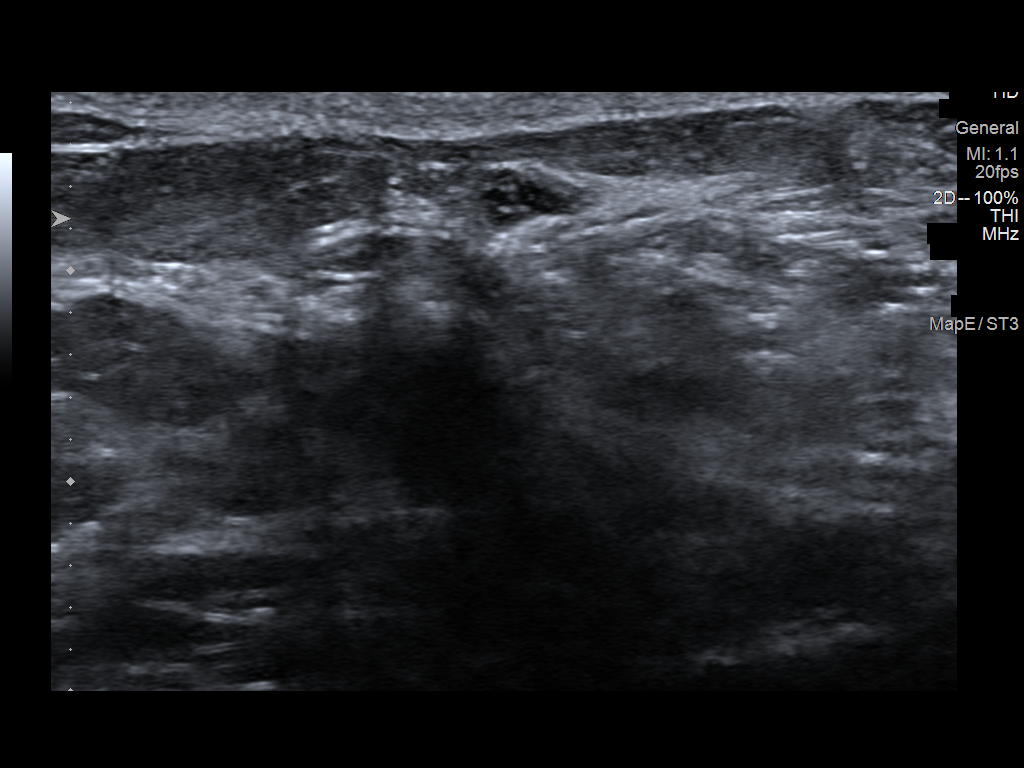
[im 2/4]
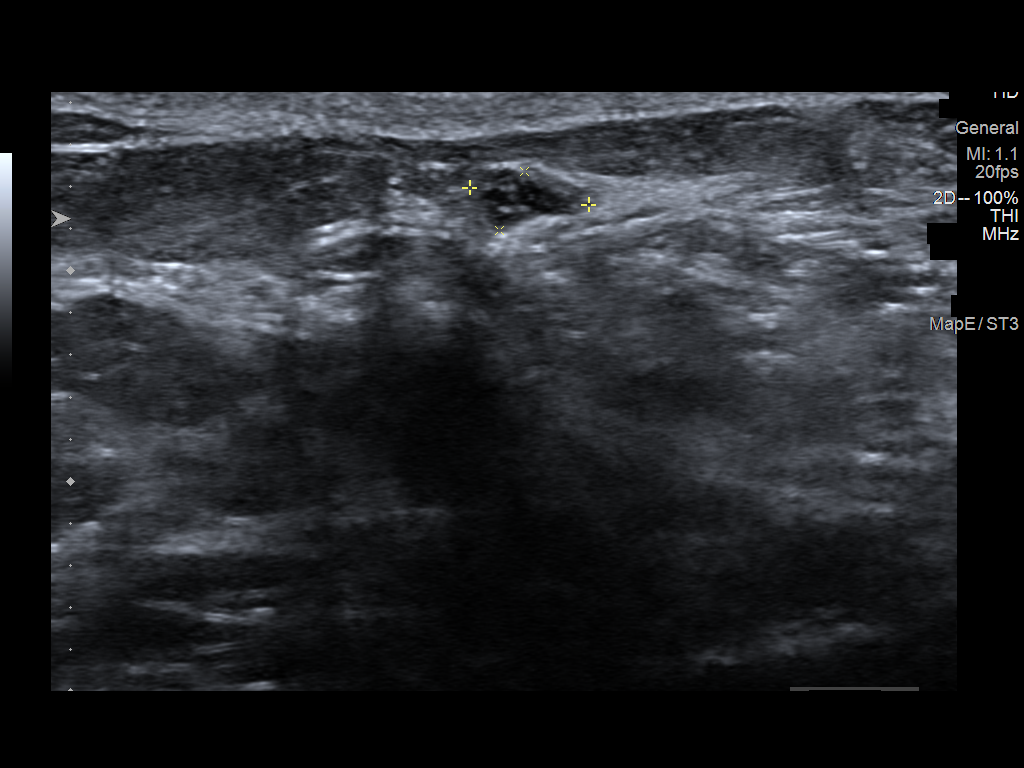
[im 3/4]
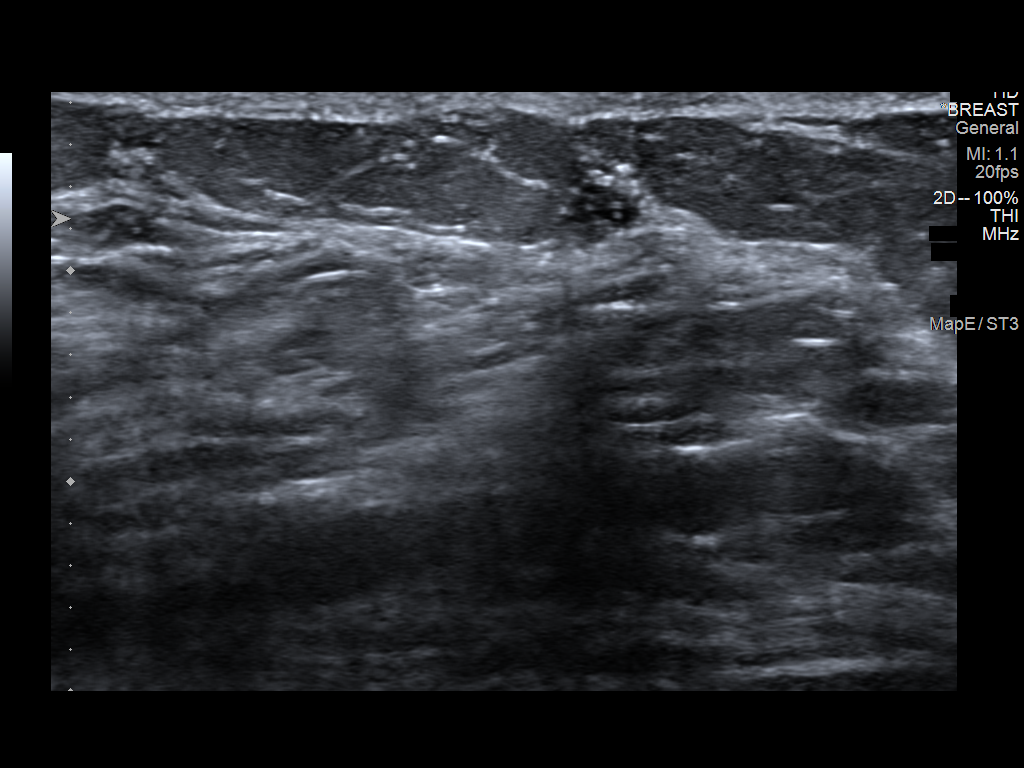
[im 4/4]
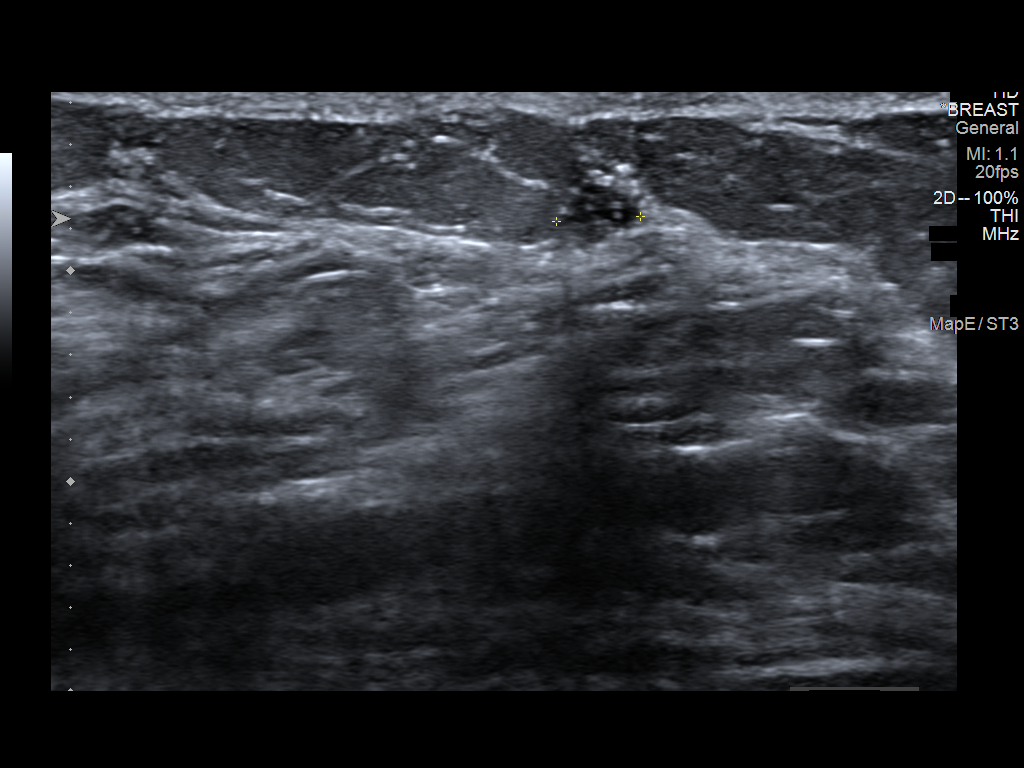

[4 of 4 positions shown; findings below may reference images not displayed]

FINDINGS: On physical exam, I palpate no abnormality in the lower inner
quadrant of the left breast.

Targeted ultrasound is performed, showing a circumscribed hypoechoic
mass with internal echoes in the 8 o'clock location of the left
breast 2 centimeters from the nipple. There is associated posterior
acoustic enhancement and no internal blood flow. Mass measures 0.4 x
0.6 x 0.3 centimeters. Mass is stable compared to prior study.
IMPRESSION: Stable appearance of probably benign left breast mass. No ultrasound
evidence for malignancy.

RECOMMENDATION:
Bilateral diagnostic mammogram and left breast ultrasound
recommended in [DATE].

I have discussed the findings and recommendations with the patient.
Results were also provided in writing at the conclusion of the
visit. If applicable, a reminder letter will be sent to the patient
regarding the next appointment.

BI-RADS CATEGORY  3: Probably benign.

## 2018-01-29 ENCOUNTER — Encounter: Payer: Self-pay | Admitting: Internal Medicine

## 2018-01-29 ENCOUNTER — Ambulatory Visit (INDEPENDENT_AMBULATORY_CARE_PROVIDER_SITE_OTHER): Payer: Medicare Other | Admitting: Internal Medicine

## 2018-01-29 VITALS — BP 163/88 | HR 88 | Temp 98.7°F | Wt 271.0 lb

## 2018-01-29 DIAGNOSIS — B20 Human immunodeficiency virus [HIV] disease: Secondary | ICD-10-CM

## 2018-01-29 DIAGNOSIS — Z87412 Personal history of vulvar dysplasia: Secondary | ICD-10-CM

## 2018-01-29 DIAGNOSIS — I10 Essential (primary) hypertension: Secondary | ICD-10-CM

## 2018-01-29 NOTE — Patient Instructions (Signed)
Labs 2 wks before visit

## 2018-01-29 NOTE — Progress Notes (Signed)
RFV: follow up for hiv disease  Patient ID: Katrina Wolfe, female   DOB: 1966-01-04, 52 y.o.   MRN: [id number]  HPI 52yo F with well controlled hiv disease, CD 4 count of 550/VL<20 (dec 2018), VIN 3 - no recurrence at recent gyn visit in January. She reports being in good health. She reports her DM under good control  Outpatient Encounter Medications as of 01/29/2018  Medication Sig  . aspirin EC 81 MG tablet Take 1 tablet (81 mg total) by mouth 2 (two) times daily.  Marland Kitchen atorvastatin (LIPITOR) 10 MG tablet Take 10 mg by mouth every morning.   . calcium carbonate (TUMS - DOSED IN MG ELEMENTAL CALCIUM) 500 MG chewable tablet Chew 1 tablet by mouth as needed for indigestion or heartburn.  Marland Kitchen CALCIUM PO Take 2 tablets by mouth daily.  . Cats Claw, Uncaria tomentosa, 500 MG CAPS Take 2 capsules by mouth every morning.   . cloNIDine (CATAPRES) 0.3 MG tablet Take 0.3 mg by mouth 2 (two) times daily.   . Coconut Oil 1000 MG CAPS Take 2 capsules by mouth daily.  Marland Kitchen docusate sodium (COLACE) 100 MG capsule Take 1 capsule (100 mg total) by mouth 2 (two) times daily. While taking narcotic pain medicine.  . elvitegravir-cobicistat-emtricitabine-tenofovir (GENVOYA) 150-150-200-10 MG TABS tablet Take 1 tablet by mouth daily with breakfast.  . HUMALOG MIX 75/25 KWIKPEN (75-25) 100 UNIT/ML Kwikpen Inject 50 Units into the skin 2 (two) times daily.   . hydrochlorothiazide (HYDRODIURIL) 25 MG tablet Take 1 tablet (25 mg total) by mouth daily.  Marland Kitchen HYDROcodone-acetaminophen (NORCO/VICODIN) 5-325 MG tablet Take 2 tablets by mouth every 4 (four) hours as needed.  . Insulin Syringe-Needle U-100 (INSULIN SYRINGE 1CC/31GX5/16") 31G X 5/16" 1 ML MISC 1 Units by Does not apply route 2 (two) times daily.  . Lancets (ONETOUCH ULTRASOFT) lancets   . lisinopril (PRINIVIL,ZESTRIL) 10 MG tablet Take 1 tablet (10 mg total) by mouth daily.  . metFORMIN (GLUCOPHAGE) 1000 MG tablet Take 1,000 mg by mouth 2 (two) times daily with a  meal.   . ONE TOUCH ULTRA TEST test strip   . oxyCODONE (ROXICODONE) 5 MG immediate release tablet Take 1-2 tablets (5-10 mg total) by mouth every 4 (four) hours as needed for moderate pain or severe pain. For no more than 3 days.  Marland Kitchen senna (SENOKOT) 8.6 MG TABS tablet Take 2 tablets (17.2 mg total) by mouth 2 (two) times daily.  Marland Kitchen VICTOZA 18 MG/3ML SOPN    No facility-administered encounter medications on file as of 01/29/2018.      Patient Active Problem List   Diagnosis Date Noted  . HIV disease ([place]) 10/27/2015  . DM type 2 (diabetes mellitus, type 2) ([place]) 10/27/2015  . Hyperlipidemia 10/27/2015  . Essential hypertension 10/27/2015  . Severe vulvar dysplasia, histologically confirmed 10/15/2015     Health Maintenance Due  Topic Date Due  . HEMOGLOBIN A1C  25-Nov-1966  . FOOT EXAM  08/16/1976  . OPHTHALMOLOGY EXAM  08/16/1976  . TETANUS/TDAP  08/16/1985  . COLONOSCOPY  08/16/2016  . INFLUENZA VACCINE  07/18/2017     Review of Systems Review of Systems  Constitutional: Negative for fever, chills, diaphoresis, activity change, appetite change, fatigue and unexpected weight change.  HENT: Negative for congestion, sore throat, rhinorrhea, sneezing, trouble swallowing and sinus pressure.  Eyes: Negative for photophobia and visual disturbance that are new Respiratory: Negative for cough, chest tightness, shortness of breath, wheezing and stridor.  Cardiovascular: Negative for chest pain, palpitations and  leg swelling.  Gastrointestinal: Negative for nausea, vomiting, abdominal pain, diarrhea, constipation, blood in stool, abdominal distention and anal bleeding.  Genitourinary: Negative for dysuria, hematuria, flank pain and difficulty urinating.  Musculoskeletal: Negative for myalgias, back pain, joint swelling, arthralgias and gait problem.  Skin: Negative for color change, pallor, rash and wound.  Neurological: Negative for dizziness, tremors, weakness and light-headedness.    Hematological: Negative for adenopathy. Does not bruise/bleed easily.  Psychiatric/Behavioral: Negative for behavioral problems, confusion, sleep disturbance, dysphoric mood, decreased concentration and agitation.    Physical Exam   BP (!) 163/88   Pulse 88   Temp 98.7 F (37.1 C) (Oral)   Wt 271 lb (122.9 kg)   LMP 07/07/2015 (Exact Date)   BMI 40.02 kg/m   Physical Exam  Constitutional:  oriented to person, place, and time. appears well-developed and well-nourished. No distress.  HENT: [place]/AT, left eye blindness, no scleral icterus Mouth/Throat: Oropharynx is clear and moist. No oropharyngeal exudate.  Cardiovascular: Normal rate, regular rhythm and normal heart sounds. Exam reveals no gallop and no friction rub.  No murmur heard.  Pulmonary/Chest: Effort normal and breath sounds normal. No respiratory distress.  has no wheezes.  Neck = supple, no nuchal rigidity Abdominal: Soft. Bowel sounds are normal.  exhibits no distension. There is no tenderness.  Lymphadenopathy: no cervical adenopathy. No axillary adenopathy Neurological: alert and oriented to person, place, and time.  Skin: Skin is warm and dry. No rash noted. No erythema.  Psychiatric: a normal mood and affect.  behavior is normal.   Lab Results  Component Value Date   CD4TCELL 20 (L) 12/06/2017   Lab Results  Component Value Date   CD4TABS 550 12/06/2017   CD4TABS 480 05/24/2017   CD4TABS 550 11/23/2016   Lab Results  Component Value Date   HIV1RNAQUANT <20 DETECTED (A) 12/06/2017   Lab Results  Component Value Date   HEPBSAB NEG 10/13/2015   Lab Results  Component Value Date   LABRPR NON-REACTIVE 12/06/2017    CBC Lab Results  Component Value Date   WBC 10.8 12/06/2017   RBC 3.93 12/06/2017   HGB 12.6 12/06/2017   HCT 36.6 12/06/2017   PLT 329 12/06/2017   MCV 93.1 12/06/2017   MCH 32.1 12/06/2017   MCHC 34.4 12/06/2017   RDW 12.6 12/06/2017   LYMPHSABS 2,408 12/06/2017   MONOABS 840  05/24/2017   EOSABS 130 12/06/2017    BMET Lab Results  Component Value Date   NA 139 12/06/2017   K 4.0 12/06/2017   CL 104 12/06/2017   CO2 22 12/06/2017   GLUCOSE 155 (H) 12/06/2017   BUN 14 12/06/2017   CREATININE 1.06 (H) 12/06/2017   CALCIUM 9.5 12/06/2017   GFRNONAA 61 12/06/2017   GFRAA 70 12/06/2017      Assessment and Plan  htn = she has not taken her bp meds this morning. She usually takes later in the day. She saw her pcp yesterday and her BP was well controlled  hiv = well controlled, cotntinue on current regimen  Health maintenance = received flu vaccine in sept through dr hussein's office  VINIII = stable, no new lesions, has been seen routinely by gyn

## 2018-03-08 DIAGNOSIS — M7672 Peroneal tendinitis, left leg: Secondary | ICD-10-CM | POA: Insufficient documentation

## 2018-03-08 HISTORY — DX: Peroneal tendinitis, left leg: M76.72

## 2018-07-09 ENCOUNTER — Ambulatory Visit
Admission: RE | Admit: 2018-07-09 | Discharge: 2018-07-09 | Disposition: A | Discharge status: Home / Self Care | Payer: Medicare Other | Source: Ambulatory Visit | Attending: Internal Medicine | Admitting: Internal Medicine

## 2018-07-09 DIAGNOSIS — N6320 Unspecified lump in the left breast, unspecified quadrant: Secondary | ICD-10-CM

## 2018-07-09 IMAGING — US ULTRASOUND LEFT BREAST LIMITED
1 series · 4 of 4 positions shown · non-contrast
Comparison: Previous exam(s).

CLINICAL DATA: Six-month follow-up of a left breast mass.

EXAM:
DIGITAL DIAGNOSTIC BILATERAL MAMMOGRAM WITH CAD AND TOMO
ULTRASOUND LEFT BREAST

[Series 1: ultrasound left breast limited · 0.06mm/px · 4 of 4 slices shown]
[im 1/4]
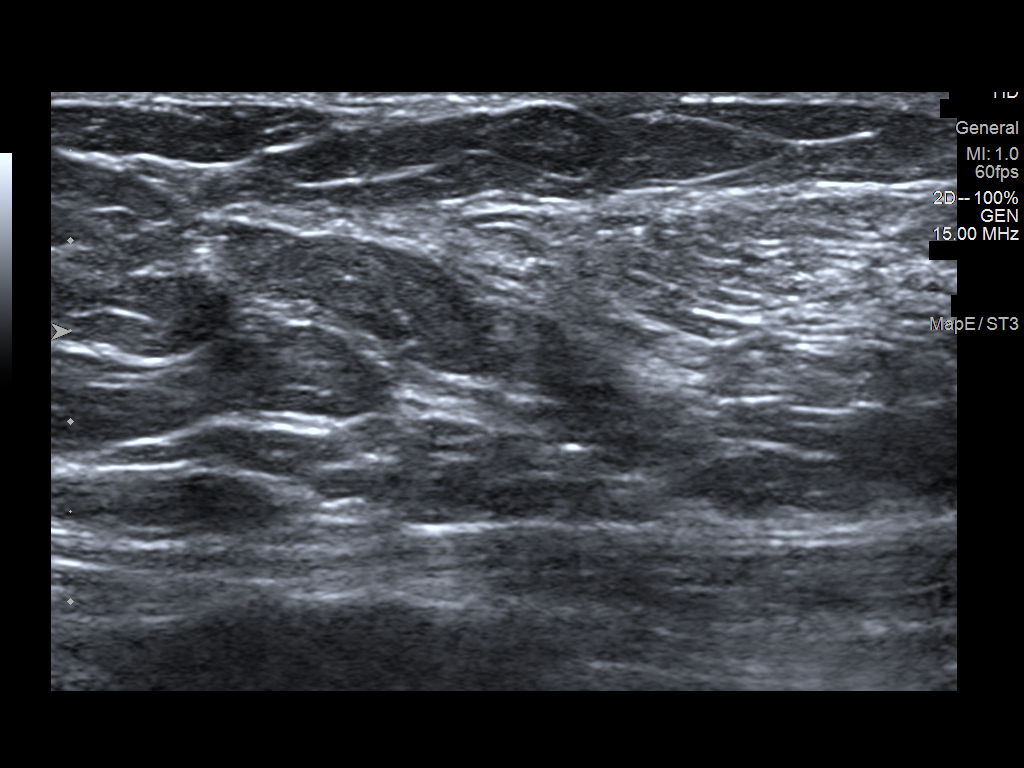
[im 2/4]
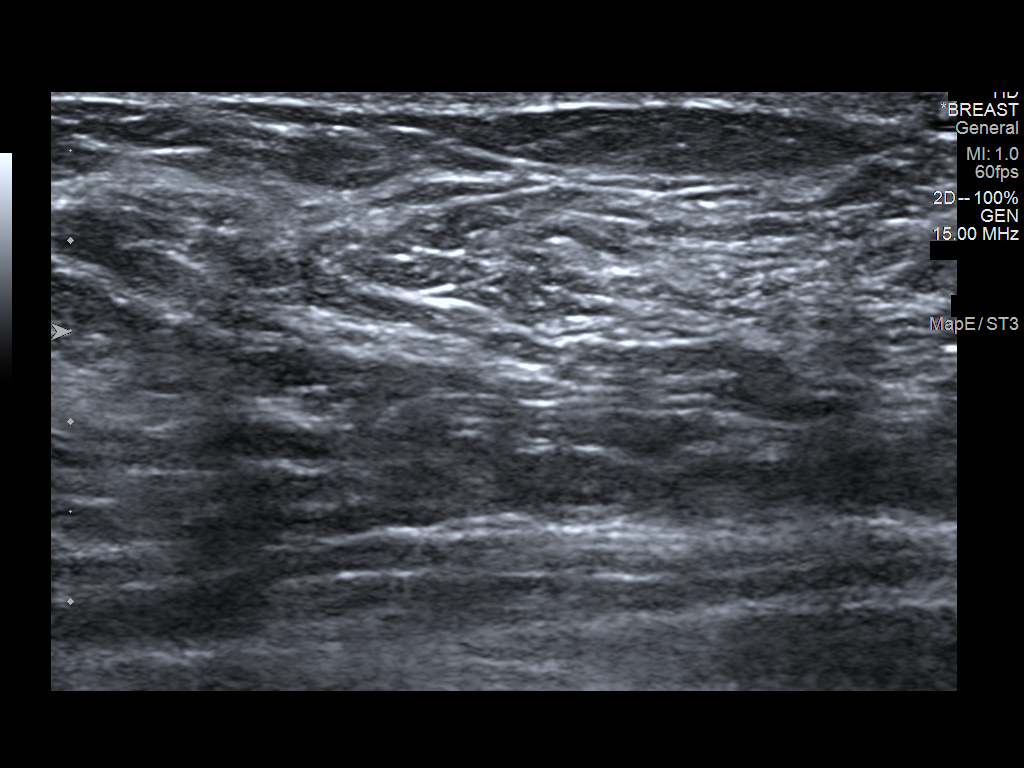
[im 3/4]
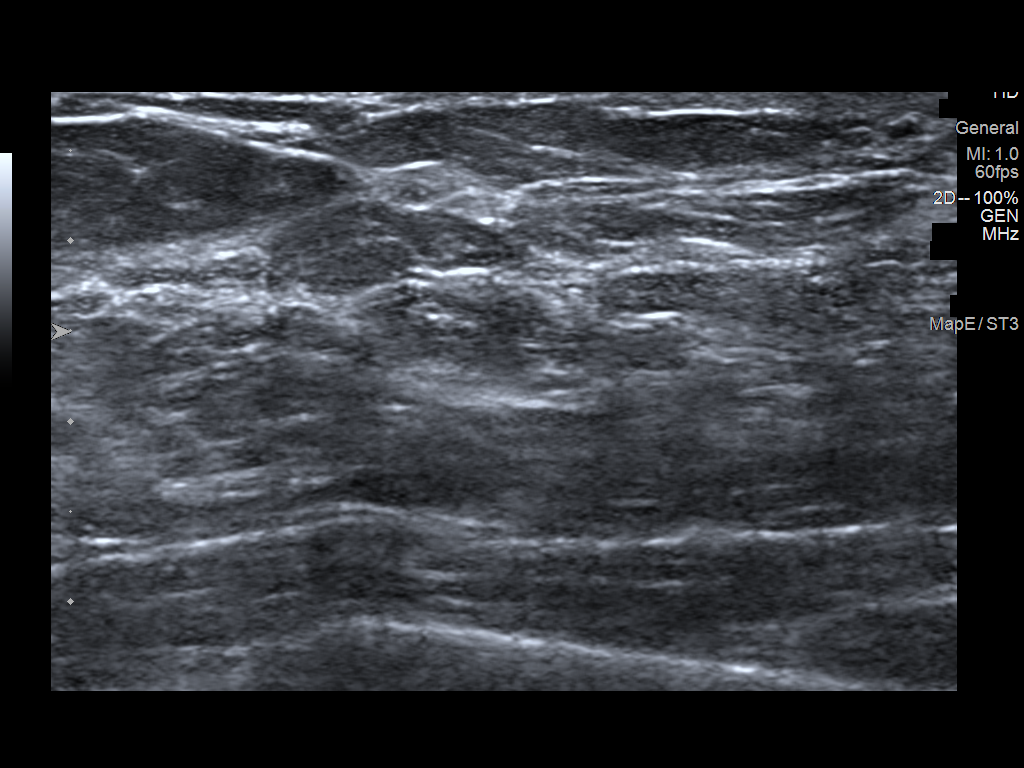
[im 4/4]
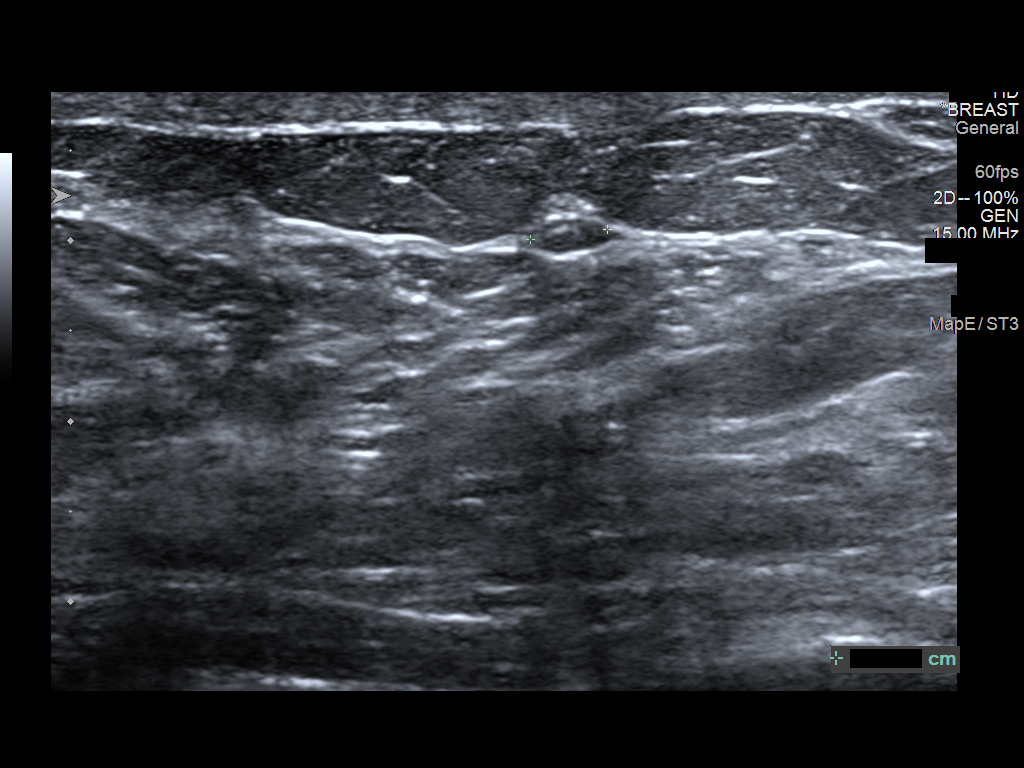

[4 of 4 positions shown; findings below may reference images not displayed]

ACR Breast Density Category b: There are scattered areas of
fibroglandular density.
FINDINGS: The mass in the medial inferior left breast has resolved
mammographically. There are vascular calcifications in the upper
outer left breast. No other abnormalities.

Mammographic images were processed with CAD.

On physical exam, no suspicious lumps identified.

Targeted ultrasound is performed, showing near complete resolution
of the medial inferior left breast mass, consistent with a benign
etiology.
IMPRESSION: No mammographic or sonographic evidence of malignancy.

RECOMMENDATION:
Annual screening mammography.

I have discussed the findings and recommendations with the patient.
Results were also provided in writing at the conclusion of the
visit. If applicable, a reminder letter will be sent to the patient
regarding the next appointment.

BI-RADS CATEGORY  2: Benign.

## 2018-07-09 IMAGING — MG DIGITAL DIAGNOSTIC BILATERAL MAMMOGRAM WITH TOMO AND CAD
6 of 10 series · 6 of 26 positions shown · non-contrast
Comparison: Previous exam(s).

CLINICAL DATA: Six-month follow-up of a left breast mass.

EXAM:
DIGITAL DIAGNOSTIC BILATERAL MAMMOGRAM WITH CAD AND TOMO
ULTRASOUND LEFT BREAST

[L CC]
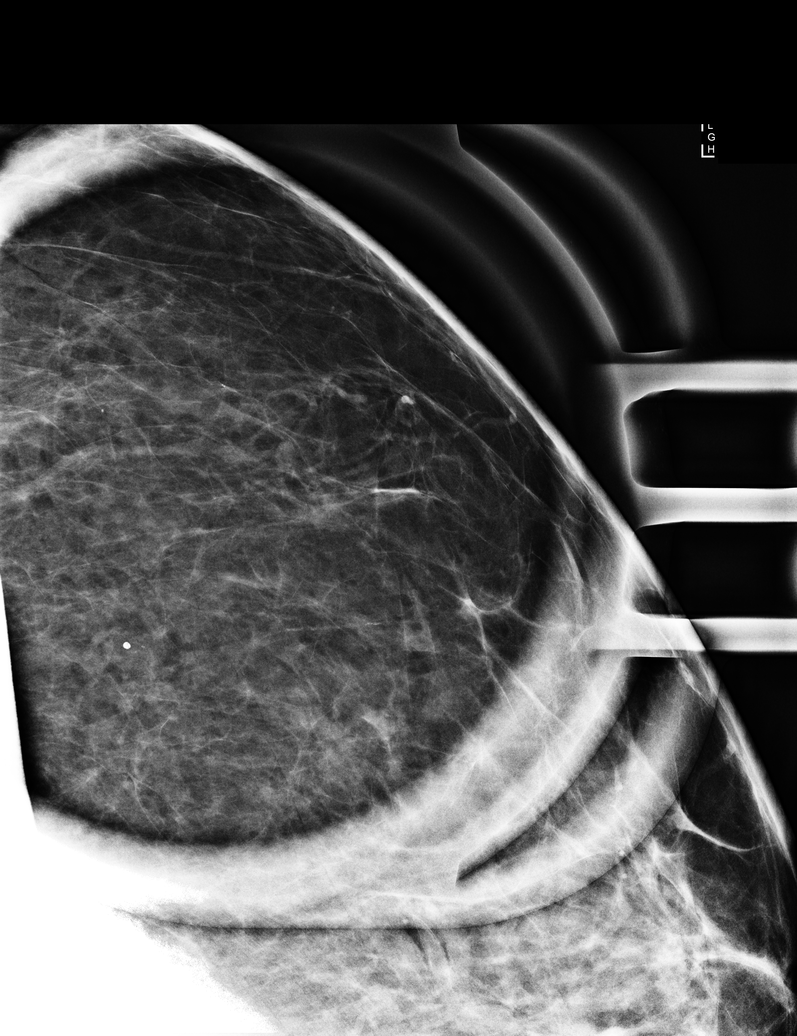

[L ML]
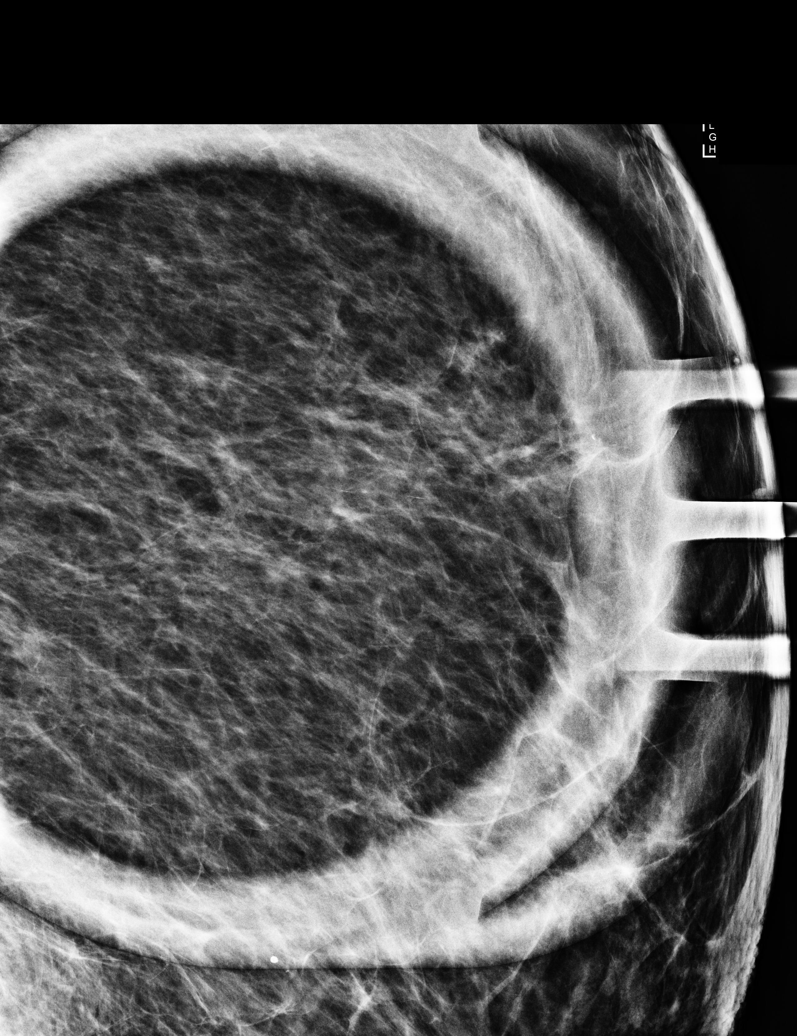

[L CC synth-2D]
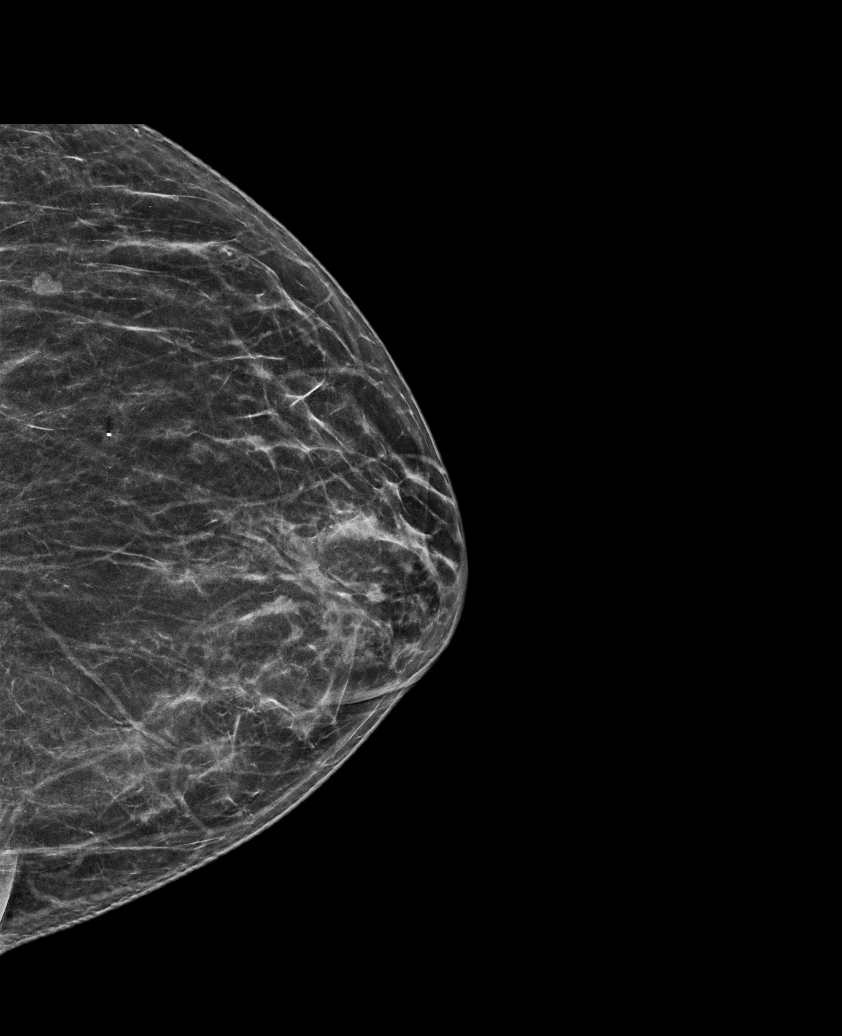

[R CC synth-2D]
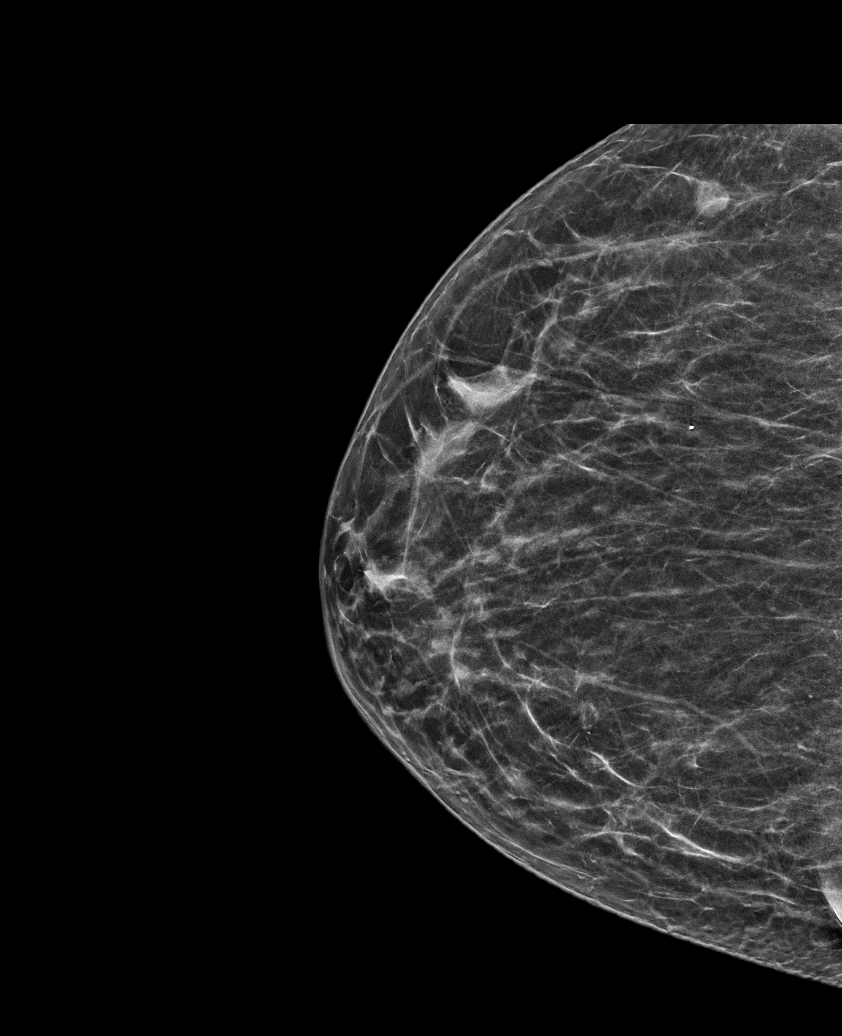

[L MLO synth-2D]
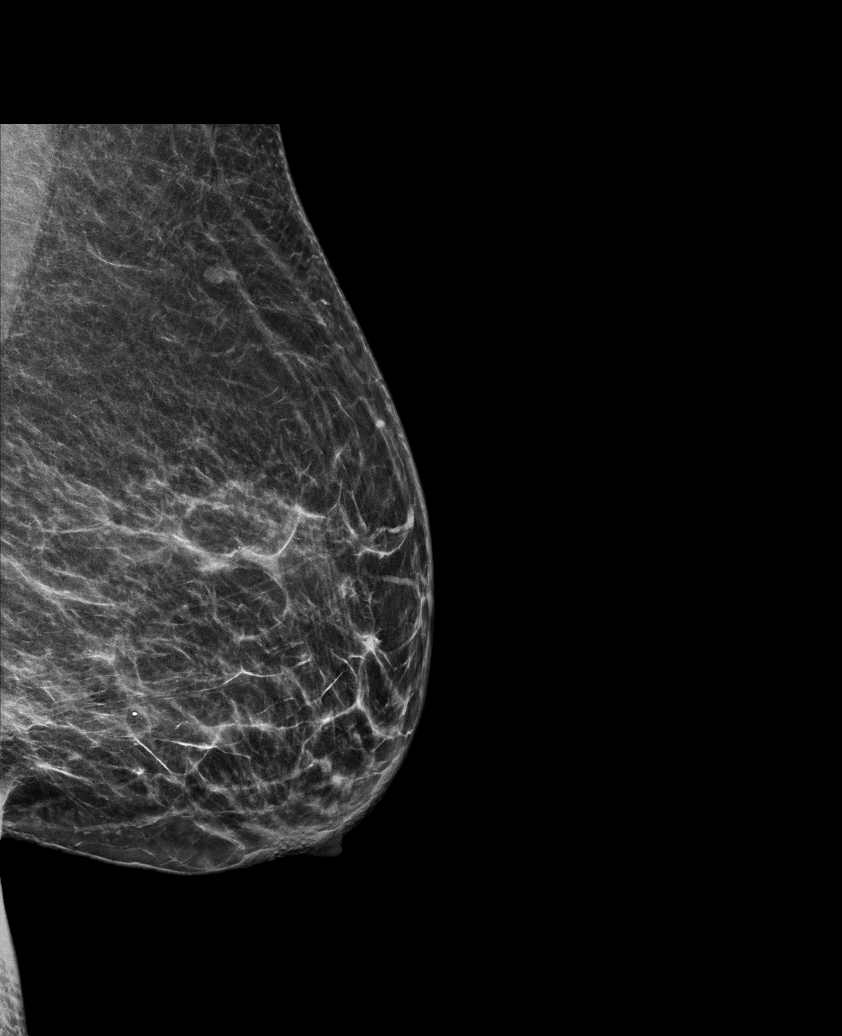

[R MLO synth-2D]
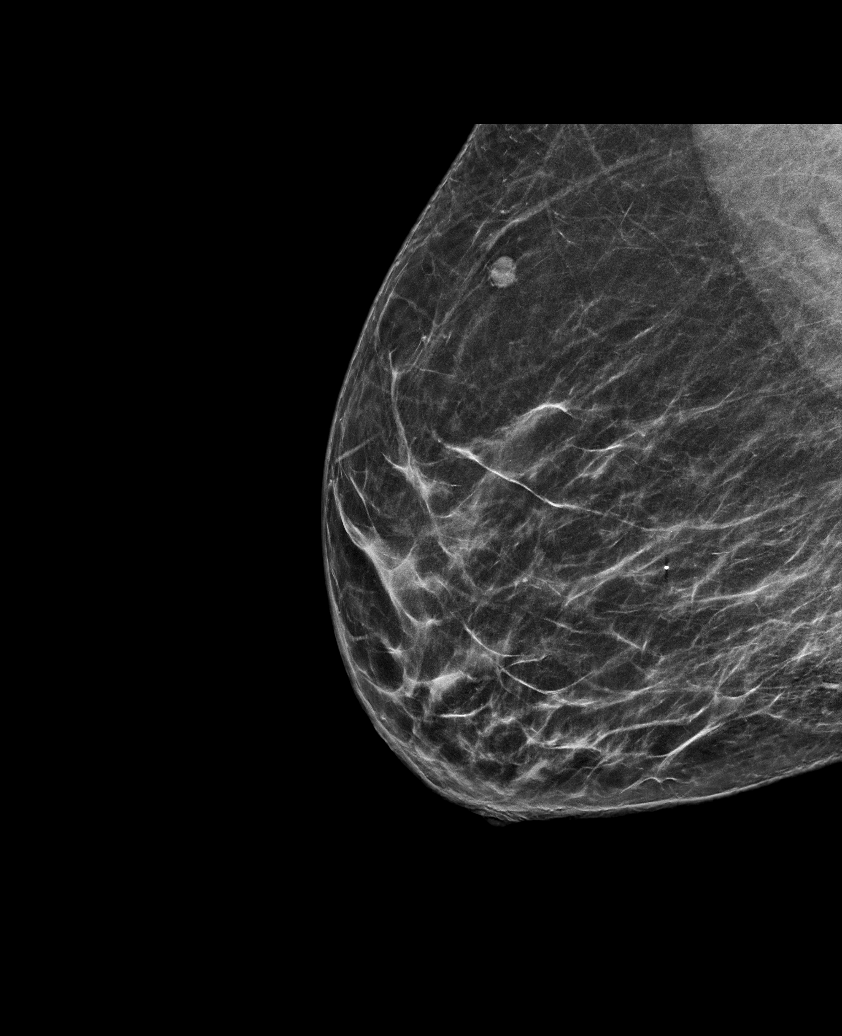

[6 of 26 positions shown; findings below may reference images not displayed]

ACR Breast Density Category b: There are scattered areas of
fibroglandular density.
FINDINGS: The mass in the medial inferior left breast has resolved
mammographically. There are vascular calcifications in the upper
outer left breast. No other abnormalities.

Mammographic images were processed with CAD.

On physical exam, no suspicious lumps identified.

Targeted ultrasound is performed, showing near complete resolution
of the medial inferior left breast mass, consistent with a benign
etiology.
IMPRESSION: No mammographic or sonographic evidence of malignancy.

RECOMMENDATION:
Annual screening mammography.

I have discussed the findings and recommendations with the patient.
Results were also provided in writing at the conclusion of the
visit. If applicable, a reminder letter will be sent to the patient
regarding the next appointment.

BI-RADS CATEGORY  2: Benign.

## 2018-07-12 ENCOUNTER — Ambulatory Visit
Admission: RE | Admit: 2018-07-12 | Discharge: 2018-07-12 | Disposition: A | Discharge status: Home / Self Care | Payer: Medicare Other | Source: Ambulatory Visit | Attending: Internal Medicine | Admitting: Internal Medicine

## 2018-07-12 ENCOUNTER — Other Ambulatory Visit: Payer: Self-pay | Admitting: Internal Medicine

## 2018-07-12 DIAGNOSIS — L97519 Non-pressure chronic ulcer of other part of right foot with unspecified severity: Secondary | ICD-10-CM

## 2018-07-12 IMAGING — DX DG FOOT 2V*R*
2 series · 2 of 2 positions shown · non-contrast
Comparison: No prior.

CLINICAL DATA: Right foot ulcer.

EXAM:
RIGHT FOOT - 2 VIEW

[dg foot 2 views right (1 of 2)]
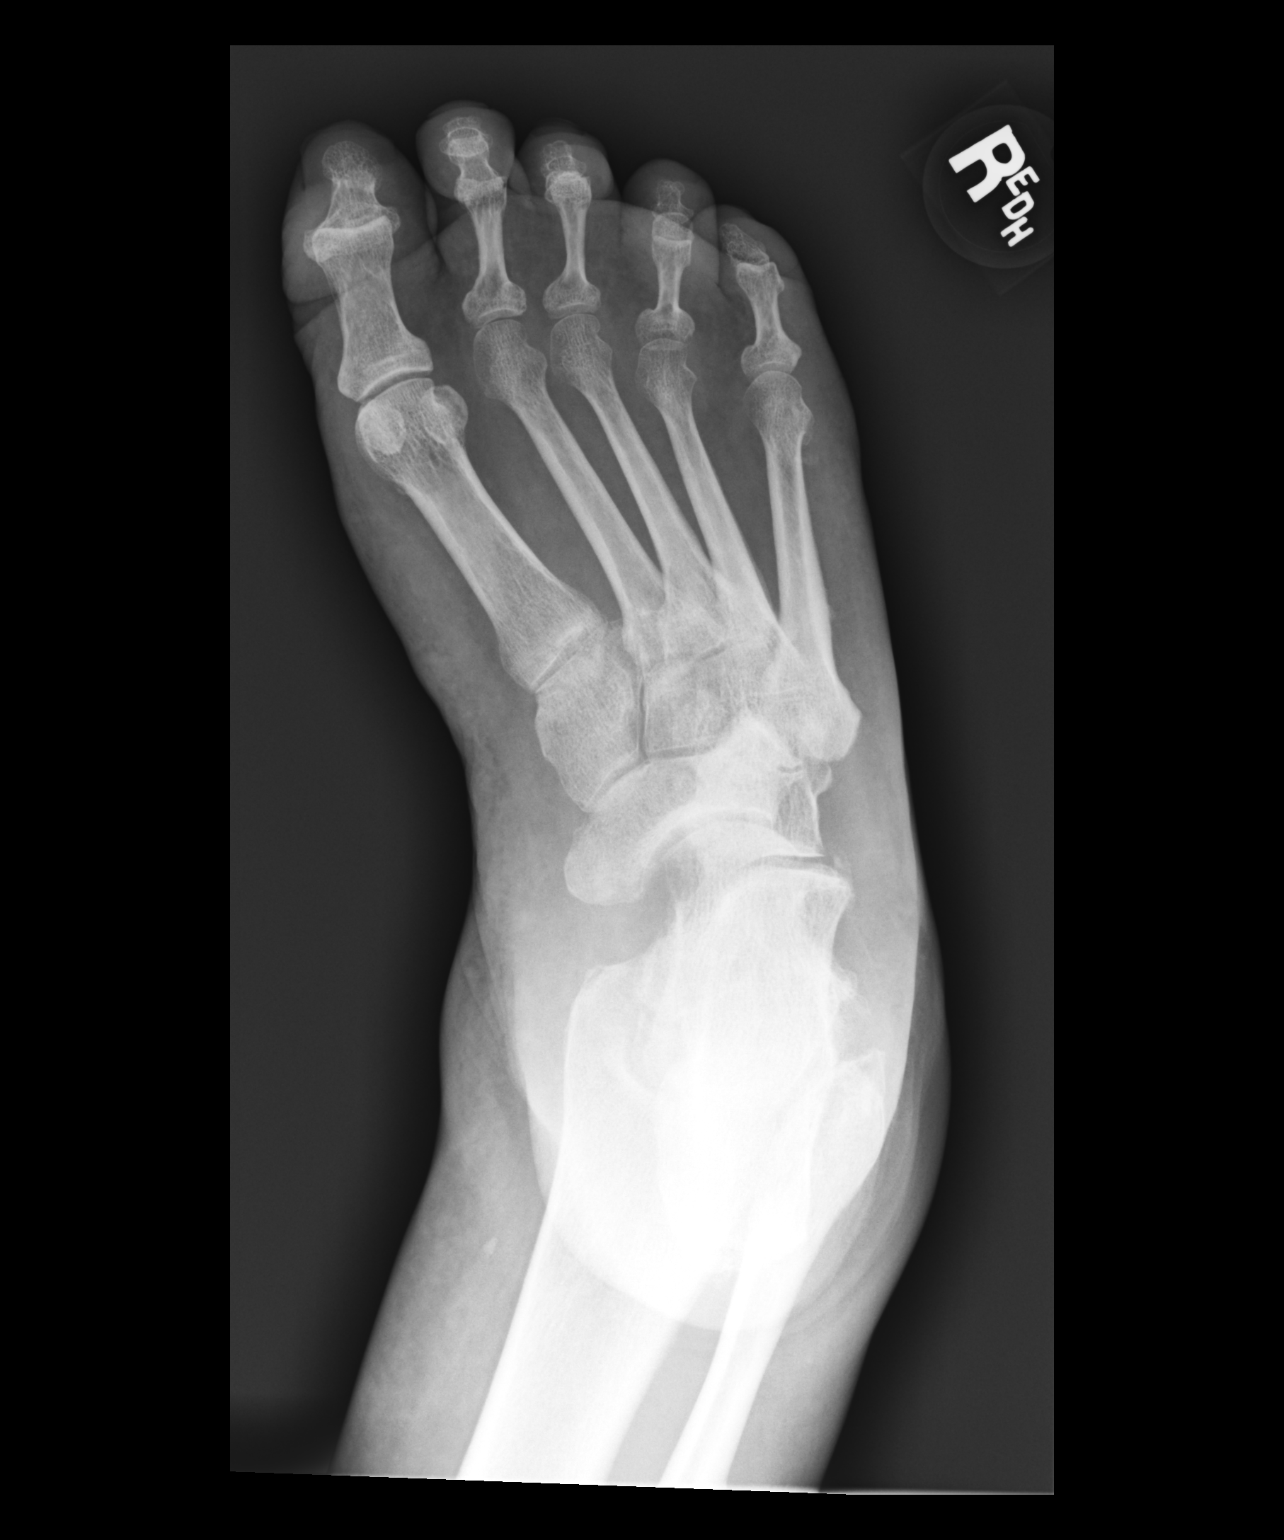

[dg foot 2 views right (2 of 2)]
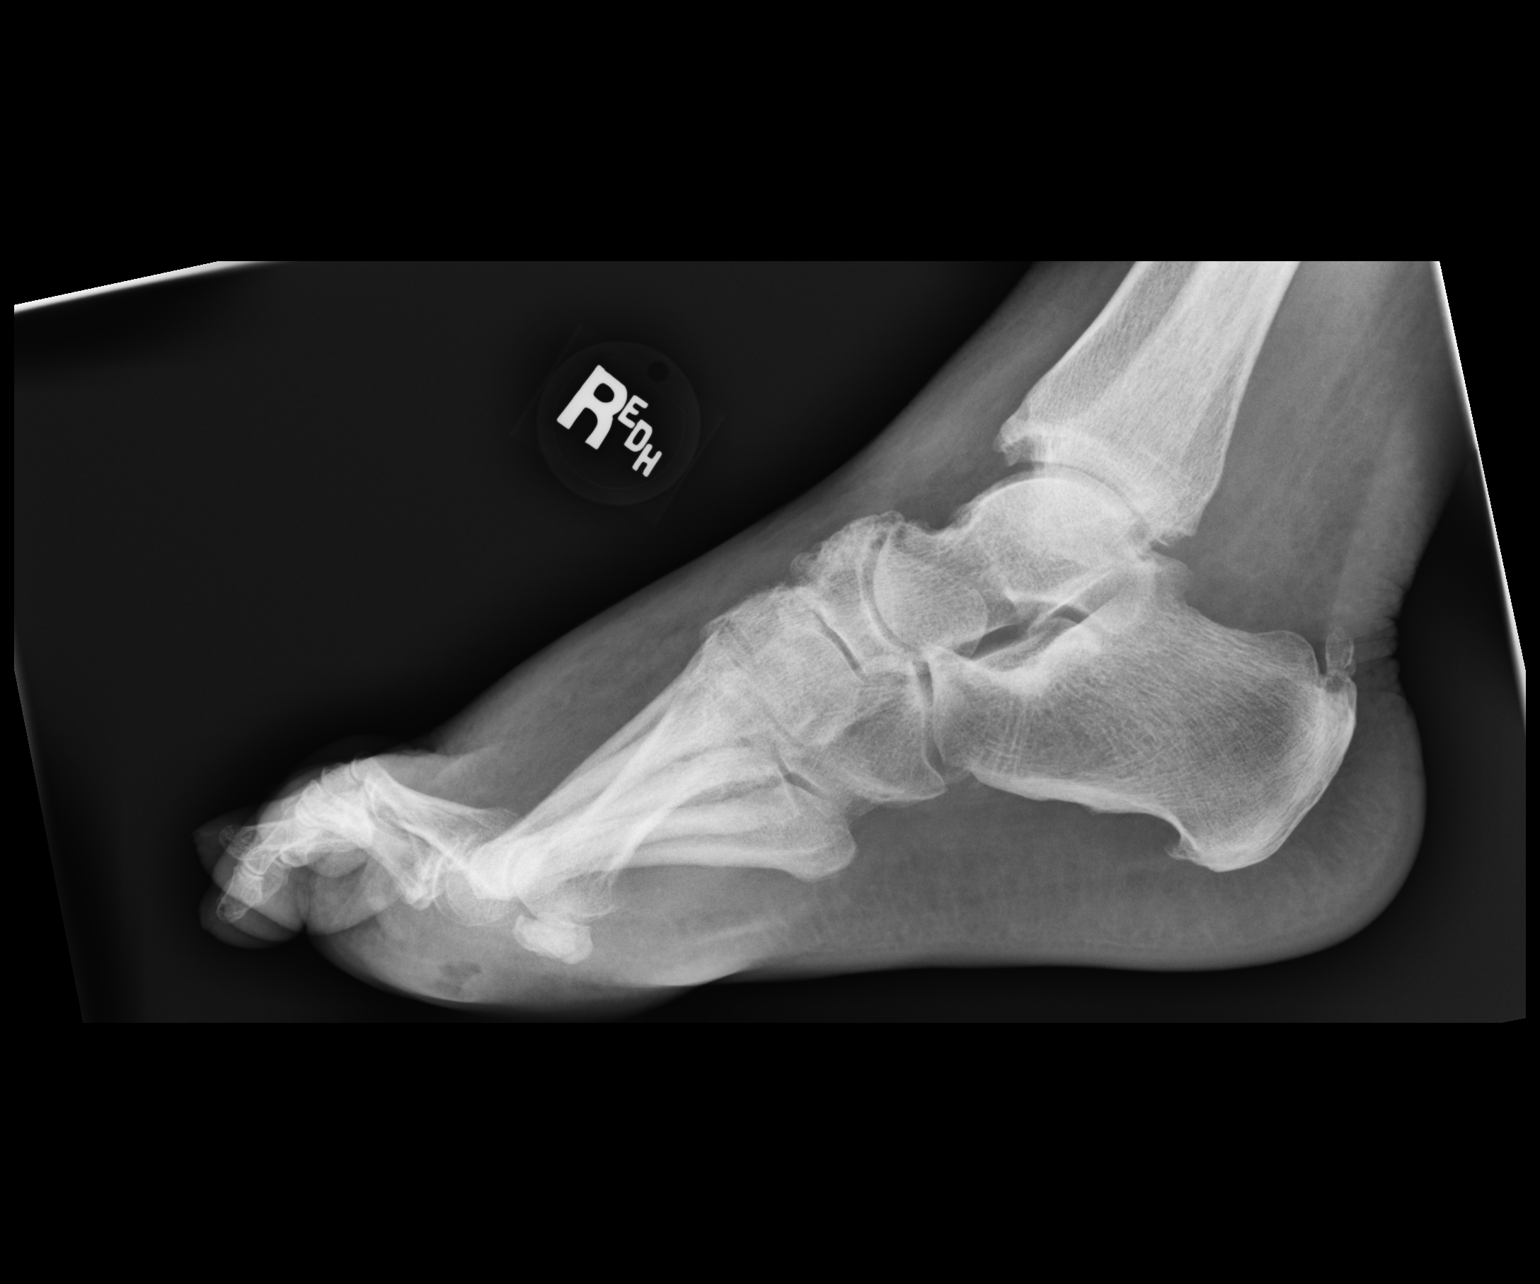

[2 of 2 positions shown; findings below may reference images not displayed]

FINDINGS: No acute bony or joint abnormality. No evidence of fracture or
dislocation. No bony erosive change. Soft tissue ulceration noted
over the plantar portion of the distal foot. No radiopaque foreign
body.
IMPRESSION: Soft tissue ulceration noted over the plantar portion of the distal
foot. No acute bony abnormality. No bony erosions.

## 2018-07-14 ENCOUNTER — Other Ambulatory Visit: Payer: Self-pay | Admitting: Internal Medicine

## 2018-07-14 DIAGNOSIS — B20 Human immunodeficiency virus [HIV] disease: Secondary | ICD-10-CM

## 2018-07-15 ENCOUNTER — Ambulatory Visit (INDEPENDENT_AMBULATORY_CARE_PROVIDER_SITE_OTHER): Payer: Medicare Other | Admitting: Orthopedic Surgery

## 2018-07-15 ENCOUNTER — Encounter (INDEPENDENT_AMBULATORY_CARE_PROVIDER_SITE_OTHER): Payer: Self-pay | Admitting: Orthopedic Surgery

## 2018-07-15 VITALS — Ht 69.0 in | Wt 271.0 lb

## 2018-07-15 DIAGNOSIS — L97411 Non-pressure chronic ulcer of right heel and midfoot limited to breakdown of skin: Secondary | ICD-10-CM

## 2018-07-15 DIAGNOSIS — E1142 Type 2 diabetes mellitus with diabetic polyneuropathy: Secondary | ICD-10-CM

## 2018-07-15 NOTE — Progress Notes (Signed)
Office Visit Note   Patient: Katrina Wolfe           Date of Birth: February 07, 1966           MRN: [id number] Visit Date: 07/15/2018              Requested by: Carlyle Basques, MD [address], [place] [address] PCP: Carlyle Basques, MD  Chief Complaint  Patient presents with  . Right Foot - Open Wound      HPI: Patient is a 52 year old woman with diabetic insensate neuropathy most recent hemoglobin A1c of 7.1.  Patient presents with an ulcer beneath the fourth metatarsal head of the right foot she is in a fracture boot on the left for 1/5 metatarsal stress fracture.  Assessment & Plan: Visit Diagnoses:  1. Midfoot skin ulcer, right, limited to breakdown of skin ([place])   2. Diabetic polyneuropathy associated with type 2 diabetes mellitus ([place])     Plan: Patient will continue her oral antibiotics we tried her in a postoperative shoe when she states that this did not give her enough support we will place her in a fracture boot on the right with that felt relieving donut to unload pressure from the fourth metatarsal head.  Reevaluate in 3 weeks.  Follow-Up Instructions: Return in about 3 weeks (around 08/05/2018).   Ortho Exam  Patient is alert, oriented, no adenopathy, well-dressed, normal affect, normal respiratory effort. Examination patient does have swelling in the right lower extremity.  I cannot palpate a pulse.  Doppler was used and patient has dopplerable triphasic pulses with better circulation in the posterior tibial artery compared to the dorsalis pedis.  Patient has an ulcer beneath the fourth metatarsal head with no surrounding cellulitis no odor no drainage.  After informed consent a 10 blade knife was used to debride the skin and soft tissue back to healthy viable granulation tissue this was touched with silver nitrate.  The ulcer is 10 mm in diameter 5 mm deep this does probe down to the capsule of the fourth toe.  Imaging: No results found. No  images are attached to the encounter.  Labs: No results found for: HGBA1C, ESRSEDRATE, CRP, LABURIC, REPTSTATUS, GRAMSTAIN, CULT, LABORGA   Lab Results  Component Value Date   ALBUMIN 3.5 (L) 05/24/2017   ALBUMIN 4.2 11/23/2016   ALBUMIN 3.6 08/17/2016    Body mass index is 40.02 kg/m.  Orders:  No orders of the defined types were placed in this encounter.  No orders of the defined types were placed in this encounter.    Procedures: No procedures performed  Clinical Data: No additional findings.  ROS:  All other systems negative, except as noted in the HPI. Review of Systems  Objective: Vital Signs: Ht 5\' 9"  (1.753 m)   Wt 271 lb (122.9 kg)   LMP 07/07/2015 (Exact Date)   BMI 40.02 kg/m   Specialty Comments:  No specialty comments available.  PMFS History: Patient Active Problem List   Diagnosis Date Noted  . HIV disease ([place]) 10/27/2015  . DM type 2 (diabetes mellitus, type 2) ([place]) 10/27/2015  . Hyperlipidemia 10/27/2015  . Essential hypertension 10/27/2015  . Severe vulvar dysplasia, histologically confirmed 10/15/2015   Past Medical History:  Diagnosis Date  . Abnormal uterine bleeding (AUB)   . Arthritis    knees  . Asthma    as child, no problems in 20 yrs  . CKD (chronic kidney disease), stage II   . Congenital  cardiomegaly    per pt has always been told since childhood and siblings   . GERD (gastroesophageal reflux disease)   . History of genital warts   . HIV (human immunodeficiency virus infection) ([place])    [place]-- DR [place]le Basques  . Hypertension   . Intermittent palpitations    mild-- no meds  . Legally blind in left eye, as defined in [place]    trauma as child  . Type 2 diabetes mellitus ([place])   . Uterine fibroid   . VIN III (vulvar intraepithelial neoplasia III)    and Verrucoid lesion on the mons  . Wears glasses     Family History  Problem Relation Age of Onset  . Hypertension Mother   .  Diabetes Father   . Cancer Brother   . Breast cancer Cousin     Past Surgical History:  Procedure Laterality Date  . CESAREAN SECTION  2001  &  1984  . CO2 LASER APPLICATION N/A 54/05/2702   Procedure: CO2 LASER APPLICATION AND WIDE VOCAL EXCSION OF LESION ON MONS PUBIS;  Surgeon: Marti Sleigh, MD;  Location: [facility];  Service: Gynecology;  Laterality: N/A;   CASE CANCELLED   . CO2 LASER APPLICATION N/A 5/00/9381   Procedure: CO2 LASER OF VULVAR;  Surgeon: Marti Sleigh, MD;  Location[facility];  Service: Gynecology;  Laterality: N/A;  . CORNEAL TRANSPLANT Left 2006   failed  . D & C HYSTEROSCOPY /  RESECTION FIBROID  2004  . KNEE ARTHROSCOPY W/ ACL RECONSTRUCTION Bilateral 2008  . ORIF TOE FRACTURE Left 08/02/2017   Procedure: OPEN REDUCTION INTERNAL FIXATION (ORIF) FIFTH METATARSAL (TOE) BASE FRACTURE;  Surgeon: Wylene Simmer, MD;  Location: [facility];  Service: Orthopedics;  Laterality: Left;  Marland Kitchen VULVECTOMY N/A 01/07/2016   Procedure: WIDE LOCAL EXCISION;  Surgeon: Marti Sleigh, MD;  Location: [facility];  Service: Gynecology;  Laterality: N/A;  mons pubis as site   Social History   Occupational History  . Not on file  Tobacco Use  . Smoking status: Never Smoker  . Smokeless tobacco: Never Used  Substance and Sexual Activity  . Alcohol use: No    Alcohol/week: 0.0 oz  . Drug use: No  . Sexual activity: Yes    Birth control/protection: None

## 2018-07-22 ENCOUNTER — Ambulatory Visit (INDEPENDENT_AMBULATORY_CARE_PROVIDER_SITE_OTHER): Payer: Self-pay | Admitting: Orthopedic Surgery

## 2018-07-29 ENCOUNTER — Other Ambulatory Visit: Payer: Medicare Other

## 2018-07-29 DIAGNOSIS — B20 Human immunodeficiency virus [HIV] disease: Secondary | ICD-10-CM

## 2018-07-30 LAB — T-HELPER CELL (CD4) - (RCID CLINIC ONLY)
CD4 % Helper T Cell: 19 % — ABNORMAL LOW (ref 33–55)
CD4 T Cell Abs: 490 /uL (ref 400–2700)

## 2018-07-31 LAB — CBC WITH DIFFERENTIAL/PLATELET
Basophils Absolute: 30 cells/uL (ref 0–200)
Basophils Relative: 0.3 %
Eosinophils Absolute: 198 cells/uL (ref 15–500)
Eosinophils Relative: 2 %
HCT: 37.7 % (ref 35.0–45.0)
Hemoglobin: 13.2 g/dL (ref 11.7–15.5)
Lymphs Abs: 2376 cells/uL (ref 850–3900)
MCH: 32.1 pg (ref 27.0–33.0)
MCHC: 35 g/dL (ref 32.0–36.0)
MCV: 91.7 fL (ref 80.0–100.0)
MPV: 11.2 fL (ref 7.5–12.5)
Monocytes Relative: 5.8 %
Neutro Abs: 6722 cells/uL (ref 1500–7800)
Neutrophils Relative %: 67.9 %
Platelets: 270 10*3/uL (ref 140–400)
RBC: 4.11 10*6/uL (ref 3.80–5.10)
RDW: 12.7 % (ref 11.0–15.0)
Total Lymphocyte: 24 %
WBC mixed population: 574 cells/uL (ref 200–950)
WBC: 9.9 10*3/uL (ref 3.8–10.8)

## 2018-07-31 LAB — BASIC METABOLIC PANEL
BUN/Creatinine Ratio: 13 (calc) (ref 6–22)
BUN: 16 mg/dL (ref 7–25)
CO2: 28 mmol/L (ref 20–32)
Calcium: 9.5 mg/dL (ref 8.6–10.4)
Chloride: 104 mmol/L (ref 98–110)
Creat: 1.21 mg/dL — ABNORMAL HIGH (ref 0.50–1.05)
Glucose, Bld: 128 mg/dL — ABNORMAL HIGH (ref 65–99)
Potassium: 4.5 mmol/L (ref 3.5–5.3)
Sodium: 140 mmol/L (ref 135–146)

## 2018-07-31 LAB — HIV-1 RNA QUANT-NO REFLEX-BLD
HIV 1 RNA Quant: <20 copies/mL — AB
HIV-1 RNA Quant, Log: <1.3 Log copies/mL — AB

## 2018-08-05 ENCOUNTER — Encounter (INDEPENDENT_AMBULATORY_CARE_PROVIDER_SITE_OTHER): Payer: Self-pay | Admitting: Orthopedic Surgery

## 2018-08-05 ENCOUNTER — Ambulatory Visit (INDEPENDENT_AMBULATORY_CARE_PROVIDER_SITE_OTHER): Payer: Medicare Other | Admitting: Orthopedic Surgery

## 2018-08-05 VITALS — Ht 69.0 in | Wt 271.0 lb

## 2018-08-05 DIAGNOSIS — L97411 Non-pressure chronic ulcer of right heel and midfoot limited to breakdown of skin: Secondary | ICD-10-CM

## 2018-08-05 DIAGNOSIS — E1142 Type 2 diabetes mellitus with diabetic polyneuropathy: Secondary | ICD-10-CM

## 2018-08-05 NOTE — Progress Notes (Signed)
Office Visit Note   Patient: Katrina Wolfe           Date of Birth: 1966/07/20           MRN: [id number] Visit Date: 08/05/2018              Requested by: [place]le Basques, MD [address] [place], [place] [address] PCP: [place] Basques, MD  Chief Complaint  Patient presents with  . Right Foot - Wound Check      HPI: Patient is a 52 year old woman diabetic insensate neuropathy Waggoner grade 1 ulcer fourth metatarsal head right foot she is currently wearing a fracture boot and a felt relieving donut.  Assessment & Plan: Visit Diagnoses:  1. Midfoot skin ulcer, right, limited to breakdown of skin ([place])   2. Diabetic polyneuropathy associated with type 2 diabetes mellitus ([place])     Plan: Ulcer was debrided of skin and soft tissues she will continue with Dial soap cleansing continue with the fracture boot continue with the felt relieving donut continue with a Band-Aid.  Follow-Up Instructions: Return in about 4 weeks (around 09/02/2018).   Ortho Exam  Patient is alert, oriented, no adenopathy, well-dressed, normal affect, normal respiratory effort. Examination patient has an antalgic gait she uses a walker.  She has fracture boots bilaterally.  She has a Waggoner grade 1 ulcer beneath the fourth metatarsal head of the right foot.  After informed consent a 10 blade knife was used to debride the skin and soft tissue back to bleeding viable granulation tissue this was touched with silver nitrate the ulcer is 2 cm in diameter 3 mm deep and a Band-Aid was applied there is no exposed bone or tendon no purulence no cellulitis no signs of infection.  Imaging: No results found. No images are attached to the encounter.  Labs: No results found for: HGBA1C, ESRSEDRATE, CRP, LABURIC, REPTSTATUS, GRAMSTAIN, CULT, LABORGA   Lab Results  Component Value Date   ALBUMIN 3.5 (L) 05/24/2017   ALBUMIN 4.2 11/23/2016   ALBUMIN 3.6 08/17/2016    Body mass index is 40.02  kg/m.  Orders:  No orders of the defined types were placed in this encounter.  No orders of the defined types were placed in this encounter.    Procedures: No procedures performed  Clinical Data: No additional findings.  ROS:  All other systems negative, except as noted in the HPI. Review of Systems  Objective: Vital Signs: Ht 5\' 9"  (1.753 m)   Wt 271 lb (122.9 kg)   LMP 07/07/2015 (Exact Date)   BMI 40.02 kg/m   Specialty Comments:  No specialty comments available.  PMFS History: Patient Active Problem List   Diagnosis Date Noted  . HIV disease ([place]) 10/27/2015  . DM type 2 (diabetes mellitus, type 2) ([place]) 10/27/2015  . Hyperlipidemia 10/27/2015  . Essential hypertension 10/27/2015  . Severe vulvar dysplasia, histologically confirmed 10/15/2015   Past Medical History:  Diagnosis Date  . Abnormal uterine bleeding (AUB)   . Arthritis    knees  . Asthma    as child, no problems in 20 yrs  . CKD (chronic kidney disease), stage II   . Congenital cardiomegaly    per pt has always been told since childhood and siblings   . GERD (gastroesophageal reflux disease)   . History of genital warts   . HIV (human immunodeficiency virus infection) ([place])    [place]-- DR [place] Basques  . Hypertension   .  Intermittent palpitations    mild-- no meds  . Legally blind in left eye, as defined in [place]    trauma as child  . Type 2 diabetes mellitus ([place])   . Uterine fibroid   . VIN III (vulvar intraepithelial neoplasia III)    and Verrucoid lesion on the mons  . Wears glasses     Family History  Problem Relation Age of Onset  . Hypertension Mother   . Diabetes Father   . Cancer Brother   . Breast cancer Cousin     Past Surgical History:  Procedure Laterality Date  . CESAREAN SECTION  2001  &  1984  . CO2 LASER APPLICATION N/A 22/03/8249   Procedure: CO2 LASER APPLICATION AND WIDE VOCAL EXCSION OF LESION ON MONS PUBIS;  Surgeon:  Marti Sleigh, MD;  Location: [facility];  Service: Gynecology;  Laterality: N/A;   CASE CANCELLED   . CO2 LASER APPLICATION N/A 0/37/0488   Procedure: CO2 LASER OF VULVAR;  Surgeon: Marti Sleigh, MD;  Location: [facility];  Service: Gynecology;  Laterality: N/A;  . CORNEAL TRANSPLANT Left 2006   failed  . D & C HYSTEROSCOPY /  RESECTION FIBROID  2004  . KNEE ARTHROSCOPY W/ ACL RECONSTRUCTION Bilateral 2008  . ORIF TOE FRACTURE Left 08/02/2017   Procedure: OPEN REDUCTION INTERNAL FIXATION (ORIF) FIFTH METATARSAL (TOE) BASE FRACTURE;  Surgeon: Wylene Simmer, MD;  Location: [facility];  Service: Orthopedics;  Laterality: Left;  Marland Kitchen VULVECTOMY N/A 01/07/2016   Procedure: WIDE LOCAL EXCISION;  Surgeon: Marti Sleigh, MD;  Location: [facility];  Service: Gynecology;  Laterality: N/A;  mons pubis as site   Social History   Occupational History  . Not on file  Tobacco Use  . Smoking status: Never Smoker  . Smokeless tobacco: Never Used  Substance and Sexual Activity  . Alcohol use: No    Alcohol/week: 0.0 standard drinks  . Drug use: No  . Sexual activity: Yes    Birth control/protection: None

## 2018-08-12 ENCOUNTER — Encounter: Payer: Self-pay | Admitting: Internal Medicine

## 2018-08-12 ENCOUNTER — Ambulatory Visit (INDEPENDENT_AMBULATORY_CARE_PROVIDER_SITE_OTHER): Payer: Medicare Other | Admitting: Internal Medicine

## 2018-08-12 ENCOUNTER — Telehealth: Payer: Self-pay | Admitting: *Deleted

## 2018-08-12 VITALS — BP 156/93 | HR 74 | Temp 97.9°F | Ht 69.0 in | Wt 265.0 lb

## 2018-08-12 DIAGNOSIS — E11621 Type 2 diabetes mellitus with foot ulcer: Secondary | ICD-10-CM

## 2018-08-12 DIAGNOSIS — L97509 Non-pressure chronic ulcer of other part of unspecified foot with unspecified severity: Secondary | ICD-10-CM | POA: Diagnosis not present

## 2018-08-12 DIAGNOSIS — B20 Human immunodeficiency virus [HIV] disease: Secondary | ICD-10-CM | POA: Diagnosis not present

## 2018-08-12 DIAGNOSIS — Z23 Encounter for immunization: Secondary | ICD-10-CM

## 2018-08-12 MED ORDER — BICTEGRAVIR-EMTRICITAB-TENOFOV 50-200-25 MG PO TABS: 1.0000 | ORAL_TABLET | Freq: Every day | ORAL | 11 refills | Status: DC

## 2018-08-12 NOTE — Patient Instructions (Signed)
Please come back in 4-6 wk to repeat blood work

## 2018-08-12 NOTE — Telephone Encounter (Signed)
When screened for depression during office visit, patient had PHQ2= 4. RN unable to complete PHQ9 due to time constraints, but offered introduction to counselor. Patient agreed, stated she has been feeling depressed lately, does not have much support.  She has connected once with [facility] - RN will alert them for reengagement.  Patient would also like a call from [facility] to set up counseling. Landis Gandy, RN

## 2018-08-12 NOTE — Progress Notes (Signed)
RFV: follow up for hiv disease  Patient ID: Katrina Wolfe, female   DOB: [birth year], 52 y.o.   MRN: [id number]  HPI Katrina Wolfe is a 52yo F with hiv disease, Cd 4 count of 490/VL<20 on genvoya but also has DM c/b diabetic insensate neuropathy Waggoner grade 1 ulcer fourth metatarsal head right foot she is currently wearing a fracture boot and a felt relieving donut. She has been following up with dr duda for management of her right foot ulcer.   Outpatient Encounter Medications as of 08/12/2018  Medication Sig  . aspirin EC 81 MG tablet Take 1 tablet (81 mg total) by mouth 2 (two) times daily.  Marland Kitchen atorvastatin (LIPITOR) 10 MG tablet Take 10 mg by mouth every morning.   . calcium carbonate (TUMS - DOSED IN MG ELEMENTAL CALCIUM) 500 MG chewable tablet Chew 1 tablet by mouth as needed for indigestion or heartburn.  Marland Kitchen CALCIUM PO Take 2 tablets by mouth daily.  . Cats Claw, Uncaria tomentosa, 500 MG CAPS Take 2 capsules by mouth every morning.   . cloNIDine (CATAPRES) 0.3 MG tablet Take 0.3 mg by mouth 2 (two) times daily.   . Coconut Oil 1000 MG CAPS Take 2 capsules by mouth daily.  Marland Kitchen docusate sodium (COLACE) 100 MG capsule Take 1 capsule (100 mg total) by mouth 2 (two) times daily. While taking narcotic pain medicine.  . elvitegravir-cobicistat-emtricitabine-tenofovir (GENVOYA) 150-150-200-10 MG TABS tablet Take 1 tablet by mouth daily with breakfast.  . GENVOYA 150-150-200-10 MG TABS tablet TAKE 1 TABLET BY MOUTH DAILY WITH BREAKFAST  . HUMALOG MIX 75/25 KWIKPEN (75-25) 100 UNIT/ML Kwikpen Inject 50 Units into the skin 2 (two) times daily.   . hydrochlorothiazide (HYDRODIURIL) 25 MG tablet Take 1 tablet (25 mg total) by mouth daily.  Marland Kitchen HYDROcodone-acetaminophen (NORCO/VICODIN) 5-325 MG tablet Take 2 tablets by mouth every 4 (four) hours as needed.  . Insulin Syringe-Needle U-100 (INSULIN SYRINGE 1CC/31GX5/16") 31G X 5/16" 1 ML MISC 1 Units by Does not apply route 2 (two) times daily.  . Lancets  (ONETOUCH ULTRASOFT) lancets   . lisinopril (PRINIVIL,ZESTRIL) 10 MG tablet Take 1 tablet (10 mg total) by mouth daily.  . metFORMIN (GLUCOPHAGE) 1000 MG tablet Take 1,000 mg by mouth 2 (two) times daily with a meal.   . ONE TOUCH ULTRA TEST test strip   . oxyCODONE (ROXICODONE) 5 MG immediate release tablet Take 1-2 tablets (5-10 mg total) by mouth every 4 (four) hours as needed for moderate pain or severe pain. For no more than 3 days.  Marland Kitchen senna (SENOKOT) 8.6 MG TABS tablet Take 2 tablets (17.2 mg total) by mouth 2 (two) times daily.  Marland Kitchen VICTOZA 18 MG/3ML SOPN    No facility-administered encounter medications on file as of 08/12/2018.      Patient Active Problem List   Diagnosis Date Noted  . HIV disease ([place]) 10/27/2015  . DM type 2 (diabetes mellitus, type 2) ([place]) 10/27/2015  . Hyperlipidemia 10/27/2015  . Essential hypertension 10/27/2015  . Severe vulvar dysplasia, histologically confirmed 10/15/2015     Health Maintenance Due  Topic Date Due  . HEMOGLOBIN A1C  1966-05-07  . FOOT EXAM  08/16/1976  . OPHTHALMOLOGY EXAM  08/16/1976  . TETANUS/TDAP  08/16/1985  . COLONOSCOPY  08/16/2016  . INFLUENZA VACCINE  07/18/2018     Review of Systems Decreased sensation in feet. Decrease eye sight- stable. Otherwise 12 point ros is negative Physical Exam  BP (!) 156/93   Pulse 74   Temp  97.9 F (36.6 C) (Oral)   Ht 5\' 9"  (1.753 m)   Wt 265 lb (120.2 kg)   LMP 07/29/2018 (Exact Date)   BMI 39.13 kg/m '. Physical Exam  Constitutional:  oriented to person, place, and time. appears well-developed and well-nourished. No distress.  HENT: [place]/AT, PERRLA, no scleral icterus Mouth/Throat: Oropharynx is clear and moist. No oropharyngeal exudate.  Cardiovascular: Normal rate, regular rhythm and normal heart sounds. Exam reveals no gallop and no friction rub.  No murmur heard.  Pulmonary/Chest: Effort normal and breath sounds normal. No respiratory distress.  has no wheezes.  Neck =  supple, no nuchal rigidity Abdominal: Soft. Bowel sounds are normal.  exhibits no distension. There is no tenderness.  Lymphadenopathy: no cervical adenopathy. No axillary adenopathy Neurological: alert and oriented to person, place, and time.  Skin: Skin is warm and dry. No rash noted. No erythema.  Psychiatric: a normal mood and affect.  behavior is normal.    Lab Results  Component Value Date   CD4TCELL 19 (L) 07/29/2018   Lab Results  Component Value Date   CD4TABS 490 07/29/2018   CD4TABS 550 12/06/2017   CD4TABS 480 05/24/2017   Lab Results  Component Value Date   HIV1RNAQUANT <20 DETECTED (A) 07/29/2018   Lab Results  Component Value Date   HEPBSAB NEG 10/13/2015   Lab Results  Component Value Date   LABRPR NON-REACTIVE 12/06/2017    CBC Lab Results  Component Value Date   WBC 9.9 07/29/2018   RBC 4.11 07/29/2018   HGB 13.2 07/29/2018   HCT 37.7 07/29/2018   PLT 270 07/29/2018   MCV 91.7 07/29/2018   MCH 32.1 07/29/2018   MCHC 35.0 07/29/2018   RDW 12.7 07/29/2018   LYMPHSABS 2,376 07/29/2018   MONOABS 840 05/24/2017   EOSABS 198 07/29/2018    BMET Lab Results  Component Value Date   NA 140 07/29/2018   K 4.5 07/29/2018   CL 104 07/29/2018   CO2 28 07/29/2018   GLUCOSE 128 (H) 07/29/2018   BUN 16 07/29/2018   CREATININE 1.21 (H) 07/29/2018   CALCIUM 9.5 07/29/2018   GFRNONAA 61 12/06/2017   GFRAA 70 12/06/2017      Assessment and Plan  hiv disease= well controlled. wil change to biktarvy  ckd 2 = see if cr decreases since stopping cobi based regimen  DM foot ulcer =continue with local care  Health maintenance = will give prevnar 13 tdoay

## 2018-08-21 ENCOUNTER — Institutional Professional Consult (permissible substitution): Payer: Medicare Other | Admitting: Licensed Clinical Social Worker

## 2018-08-29 ENCOUNTER — Telehealth: Payer: Self-pay

## 2018-08-29 NOTE — Telephone Encounter (Signed)
Incoming call from patient regarding making appt for f/u with Dr Aldean Ast.  Appt given for Sept 24th at 9:30 am, arrive at 9:15 am.  Pt aggreable and no other needs per pt at this time.

## 2018-08-29 NOTE — Telephone Encounter (Signed)
Called patient regarding pap smear. Patient is on the overdue pap list. Ms. Katrina Wolfe states she is scheduled for a pap on 10/16. [place]

## 2018-09-02 ENCOUNTER — Ambulatory Visit (INDEPENDENT_AMBULATORY_CARE_PROVIDER_SITE_OTHER): Payer: Medicare Other | Admitting: Orthopedic Surgery

## 2018-09-09 ENCOUNTER — Ambulatory Visit (INDEPENDENT_AMBULATORY_CARE_PROVIDER_SITE_OTHER): Payer: Medicare Other | Admitting: Physician Assistant

## 2018-09-09 ENCOUNTER — Encounter (INDEPENDENT_AMBULATORY_CARE_PROVIDER_SITE_OTHER): Payer: Self-pay | Admitting: Orthopedic Surgery

## 2018-09-09 VITALS — Ht 69.0 in | Wt 265.0 lb

## 2018-09-09 DIAGNOSIS — E1142 Type 2 diabetes mellitus with diabetic polyneuropathy: Secondary | ICD-10-CM

## 2018-09-09 DIAGNOSIS — L97411 Non-pressure chronic ulcer of right heel and midfoot limited to breakdown of skin: Secondary | ICD-10-CM

## 2018-09-09 NOTE — Progress Notes (Signed)
Office Visit Note   Patient: Katrina Wolfe           Date of Birth: April 04, 1966           MRN: [id number] Visit Date: 09/09/2018              Requested by: [place] Basques, MD [address] [place], [place] [address] PCP: [place] Basques, MD  Chief Complaint  Patient presents with  . Right Foot - Follow-up      HPI: Patient is a 52 year old female who is seen for follow-up for a ulcer of the plantar surface of the right midfoot.  She has been walking in a cam walker boot.  She has a felt donut for pressure relief.  She reports that feeling okay and she really does not have much sensation there.  She reports no drainage from the area.  Assessment & Plan: Visit Diagnoses:  1. Midfoot skin ulcer, right, limited to breakdown of skin ([place])   2. Diabetic polyneuropathy associated with type 2 diabetes mellitus ([place])     Plan: The Waggoner grade 1 ulcer of the right midfoot plantar surface was debrided sharply after informed consent and the patient tolerated this well.  She will continue to walk with a walker boot and felt though not to pressure relieve the area.  Follow-Up Instructions: Return in about 4 weeks (around 10/07/2018).   Ortho Exam  Patient is alert, oriented, no adenopathy, well-dressed, normal affect, normal respiratory effort. Patient presents with a cam walker boot.  There is peri-ulcer moisture/maceration. The minor grade 1 ulcer beneath the fourth metatarsal head was debrided after informed consent with a #10 blade knife.  Skin and soft tissue and callus were debrided back.  Residual ulcer is approximately 2 cm in diameter and 3 mm in depth with no visible bone or tendon.  No purulent drainage.  No signs of cellulitis. Imaging: No results found. No images are attached to the encounter.  Labs: No results found for: HGBA1C, ESRSEDRATE, CRP, LABURIC, REPTSTATUS, GRAMSTAIN, CULT, LABORGA   Lab Results  Component Value Date   ALBUMIN 3.5 (L)  05/24/2017   ALBUMIN 4.2 11/23/2016   ALBUMIN 3.6 08/17/2016    Body mass index is 39.13 kg/m.  Orders:  No orders of the defined types were placed in this encounter.  No orders of the defined types were placed in this encounter.    Procedures: No procedures performed  Clinical Data: No additional findings.  ROS:  All other systems negative, except as noted in the HPI. Review of Systems  Objective: Vital Signs: Ht 5\' 9"  (1.753 m)   Wt 265 lb (120.2 kg)   LMP 07/29/2018 (Exact Date)   BMI 39.13 kg/m   Specialty Comments:  No specialty comments available.  PMFS History: Patient Active Problem List   Diagnosis Date Noted  . HIV disease ([place]) 10/27/2015  . DM type 2 (diabetes mellitus, type 2) ([place]) 10/27/2015  . Hyperlipidemia 10/27/2015  . Essential hypertension 10/27/2015  . Severe vulvar dysplasia, histologically confirmed 10/15/2015   Past Medical History:  Diagnosis Date  . Abnormal uterine bleeding (AUB)   . Arthritis    knees  . Asthma    as child, no problems in 20 yrs  . CKD (chronic kidney disease), stage II   . Congenital cardiomegaly    per pt has always been told since childhood and siblings   . GERD (gastroesophageal reflux disease)   . History of genital warts   .  HIV (human immunodeficiency virus infection) ([place])    [place]-- DR [place] Basques  . Hypertension   . Intermittent palpitations    mild-- no meds  . Legally blind in left eye, as defined in [place]    trauma as child  . Type 2 diabetes mellitus ([place])   . Uterine fibroid   . VIN III (vulvar intraepithelial neoplasia III)    and Verrucoid lesion on the mons  . Wears glasses     Family History  Problem Relation Age of Onset  . Hypertension Mother   . Diabetes Father   . Cancer Brother   . Breast cancer Cousin     Past Surgical History:  Procedure Laterality Date  . CESAREAN SECTION  2001  &  1984  . CO2 LASER APPLICATION N/A 32/05/7123    Procedure: CO2 LASER APPLICATION AND WIDE VOCAL EXCSION OF LESION ON MONS PUBIS;  Surgeon: Marti Sleigh, MD;  Location: [facility];  Service: Gynecology;  Laterality: N/A;   CASE CANCELLED   . CO2 LASER APPLICATION N/A 5/80/9983   Procedure: CO2 LASER OF VULVAR;  Surgeon: Marti Sleigh, MD;  Location: [facility];  Service: Gynecology;  Laterality: N/A;  . CORNEAL TRANSPLANT Left 2006   failed  . D & C HYSTEROSCOPY /  RESECTION FIBROID  2004  . KNEE ARTHROSCOPY W/ ACL RECONSTRUCTION Bilateral 2008  . ORIF TOE FRACTURE Left 08/02/2017   Procedure: OPEN REDUCTION INTERNAL FIXATION (ORIF) FIFTH METATARSAL (TOE) BASE FRACTURE;  Surgeon: Wylene Simmer, MD;  Location: [facility];  Service: Orthopedics;  Laterality: Left;  Marland Kitchen VULVECTOMY N/A 01/07/2016   Procedure: WIDE LOCAL EXCISION;  Surgeon: Marti Sleigh, MD;  Location: [facility];  Service: Gynecology;  Laterality: N/A;  mons pubis as site   Social History   Occupational History  . Not on file  Tobacco Use  . Smoking status: Never Smoker  . Smokeless tobacco: Never Used  Substance and Sexual Activity  . Alcohol use: No    Alcohol/week: 0.0 standard drinks  . Drug use: No  . Sexual activity: Yes    Partners: Male    Birth control/protection: None, Condom    Comment: declined condoms - has at home

## 2018-09-10 ENCOUNTER — Encounter: Payer: Self-pay | Admitting: Gynecology

## 2018-09-10 ENCOUNTER — Inpatient Hospital Stay: Payer: Medicare Other | Attending: Gynecology | Admitting: Gynecology

## 2018-09-10 ENCOUNTER — Encounter (INDEPENDENT_AMBULATORY_CARE_PROVIDER_SITE_OTHER): Payer: Self-pay | Admitting: Physician Assistant

## 2018-09-10 VITALS — BP 156/83 | HR 72 | Temp 97.7°F | Resp 18 | Ht 69.0 in | Wt 264.0 lb

## 2018-09-10 DIAGNOSIS — B20 Human immunodeficiency virus [HIV] disease: Secondary | ICD-10-CM | POA: Diagnosis not present

## 2018-09-10 DIAGNOSIS — D071 Carcinoma in situ of vulva: Secondary | ICD-10-CM

## 2018-09-10 NOTE — Patient Instructions (Signed)
Plan to follow up in one year or sooner if needed or if new symptoms arise.  Please call our office in July or August 2020 at [phone] to schedule for September 2020.

## 2018-09-10 NOTE — Progress Notes (Signed)
Consult Note: Gyn-Onc   Katrina Wolfe 52 y.o. female  Chief Complaint  Patient presents with  . 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 No evidence of recurrent disease.  Plan:   The patient given information regarding symptoms associated with recurrent disease. She'll contact me she develops any new problems. Otherwise she return in 1 year.  Interval history: Patient underwent wide local excision of a verrocoid lesion of her mons on 01/07/2016. She also had extensive laser vaporization of vulvar VIN 3. Final pathology of the excisional biopsy of the mons showed VIN 3 with negative margins.   She has not noted any new lesions and has no symptoms. Specifically she is not having any pruritus. Otherwise her health has been good ... She denies any other pelvic symptoms bleeding or discharge. She denies any GI or GU symptoms.  HPI: 52 year old African American female seen in consultation the request of Dr.Varnado regarding management of recurrent VIN 3. Patient presented with lesions on the vulva and mons which been biopsied showing both with severe dysplasia. Patient reports that she had laser vaporization of the vulva in 2010 in [place] for similar condition. She is not on any immunosuppressive therapies. Pap smears in the past of a normal period the patient is not had a menstrual period since July 2016.  She underwent wide local excision of the lesion on her mons (VIN-III) as well as laser vaporization of several lesions of the vulva on 01/07/2016.  Review of Systems:10 point review of systems is negative except as noted in interval history.   Vitals: Blood pressure (!) 156/83, pulse 72, temperature 97.7 F (36.5 C), temperature source Oral, resp. rate 18, height 5\' 9"  (1.753 m), weight 264 lb (119.7 kg), last menstrual period 07/29/2018, SpO2 99 %.  Physical Exam: General : The patient is a healthy woman in no acute distress.  HEENT:  normocephalic, extraoccular movements normal; neck is supple without thyromegally  Lynphnodes: Supraclavicular and inguinal nodes not enlarged  Abdomen: Soft, non-tender, no ascites, no organomegally, no masses, no hernias  Pelvic:  Mons. There are no new lesions... EGBUS: Normal female .   There are no lesions noted.  All lasered areas have healed well   Lower extremities: No edema or varicosities. Normal range of motion      Allergies  Allergen Reactions  . Shellfish Allergy Anaphylaxis    All types shellfish  . Bactrim [Sulfamethoxazole-Trimethoprim] Hives    Past Medical History:  Diagnosis Date  . Abnormal uterine bleeding (AUB)   . Arthritis    knees  . Asthma    as child, no problems in 20 yrs  . CKD (chronic kidney disease), stage II   . Congenital cardiomegaly    per pt has always been told since childhood and siblings   . GERD (gastroesophageal reflux disease)   . History of genital warts   . HIV (human immunodeficiency virus infection) ([place])    [place]-- DR Carlyle Basques  . Hypertension   . Intermittent palpitations    mild-- no meds  . Legally blind in left eye, as defined in [place]    trauma as child  . Type 2 diabetes mellitus ([place])   . Uterine fibroid   . VIN III (vulvar intraepithelial neoplasia III)    and Verrucoid lesion on the mons  . Wears glasses     Past Surgical History:  Procedure Laterality Date  .  CESAREAN SECTION  2001  &  1984  . CO2 LASER APPLICATION N/A 11/23/5169   Procedure: CO2 LASER APPLICATION AND WIDE VOCAL EXCSION OF LESION ON MONS PUBIS;  Surgeon: Marti Sleigh, MD;  Location: [facility];  Service: Gynecology;  Laterality: N/A;   CASE CANCELLED   . CO2 LASER APPLICATION N/A 0/17/4944   Procedure: CO2 LASER OF VULVAR;  Surgeon: Marti Sleigh, MD;  Location: [facility];  Service: Gynecology;  Laterality: N/A;  . CORNEAL TRANSPLANT Left 2006   failed   . D & C HYSTEROSCOPY /  RESECTION FIBROID  2004  . KNEE ARTHROSCOPY W/ ACL RECONSTRUCTION Bilateral 2008  . ORIF TOE FRACTURE Left 08/02/2017   Procedure: OPEN REDUCTION INTERNAL FIXATION (ORIF) FIFTH METATARSAL (TOE) BASE FRACTURE;  Surgeon: Wylene Simmer, MD;  Location: [facility];  Service: Orthopedics;  Laterality: Left;  Marland Kitchen VULVECTOMY N/A 01/07/2016   Procedure: WIDE LOCAL EXCISION;  Surgeon: Marti Sleigh, MD;  Location: [facility];  Service: Gynecology;  Laterality: N/A;  mons pubis as site    Current Outpatient Medications  Medication Sig Dispense Refill  . aspirin EC 81 MG tablet Take 1 tablet (81 mg total) by mouth 2 (two) times daily. 84 tablet 0  . atorvastatin (LIPITOR) 10 MG tablet Take 10 mg by mouth every morning.     . bictegravir-emtricitabine-tenofovir AF (BIKTARVY) 50-200-25 MG TABS tablet Take 1 tablet by mouth daily. 30 tablet 11  . calcium carbonate (TUMS - DOSED IN MG ELEMENTAL CALCIUM) 500 MG chewable tablet Chew 1 tablet by mouth as needed for indigestion or heartburn.    . Cats Claw, Uncaria tomentosa, 500 MG CAPS Take 2 capsules by mouth every morning.     . cloNIDine (CATAPRES) 0.3 MG tablet Take 0.3 mg by mouth 2 (two) times daily.     . Coconut Oil 1000 MG CAPS Take 2 capsules by mouth daily.    Marland Kitchen docusate sodium (COLACE) 100 MG capsule Take 1 capsule (100 mg total) by mouth 2 (two) times daily. While taking narcotic pain medicine. (Patient taking differently: Take 100 mg by mouth as needed. While taking narcotic pain medicine.) 30 capsule 0  . gabapentin (NEURONTIN) 100 MG capsule Take 100 mg by mouth 3 (three) times daily.    Marland Kitchen glimepiride (AMARYL) 2 MG tablet Take 2 mg by mouth daily.    Marland Kitchen HUMALOG MIX 75/25 KWIKPEN (75-25) 100 UNIT/ML Kwikpen Inject 56 Units into the skin 2 (two) times daily.     . hydrochlorothiazide (HYDRODIURIL) 25 MG tablet Take 1 tablet (25 mg total) by mouth daily. 30 tablet 0  .  HYDROcodone-acetaminophen (NORCO/VICODIN) 5-325 MG tablet Take 1 tablet by mouth as needed.     . Insulin Syringe-Needle U-100 (INSULIN SYRINGE 1CC/31GX5/16") 31G X 5/16" 1 ML MISC 1 Units by Does not apply route 2 (two) times daily. 30 each 0  . Lancets (ONETOUCH ULTRASOFT) lancets     . lisinopril (PRINIVIL,ZESTRIL) 10 MG tablet Take 1 tablet (10 mg total) by mouth daily. 30 tablet 0  . metFORMIN (GLUCOPHAGE) 1000 MG tablet Take 1,000 mg by mouth 2 (two) times daily with a meal.     . ONE TOUCH ULTRA TEST test strip     . senna (SENOKOT) 8.6 MG TABS tablet Take 2 tablets (17.2 mg total) by mouth 2 (two) times daily. 30 each 0  . VICTOZA 18 MG/3ML SOPN 1.8 mg daily.      No current facility-administered medications for  this visit.     Social History   Socioeconomic History  . Marital status: Married    Spouse name: Not on file  . Number of children: Not on file  . Years of education: Not on file  . Highest education level: Not on file  Occupational History  . Not on file  Social Needs  . Financial resource strain: Not on file  . Food insecurity:    Worry: Not on file    Inability: Not on file  . Transportation needs:    Medical: Not on file    Non-medical: Not on file  Tobacco Use  . Smoking status: Never Smoker  . Smokeless tobacco: Never Used  Substance and Sexual Activity  . Alcohol use: No    Alcohol/week: 0.0 standard drinks  . Drug use: No  . Sexual activity: Yes    Partners: Male    Birth control/protection: None, Condom    Comment: declined condoms - has at home  Lifestyle  . Physical activity:    Days per week: Not on file    Minutes per session: Not on file  . Stress: Not on file  Relationships  . Social connections:    Talks on phone: Not on file    Gets together: Not on file    Attends religious service: Not on file    Active member of club or organization: Not on file    Attends meetings of clubs or organizations: Not on file    Relationship status:  Not on file  . Intimate partner violence:    Fear of current or ex partner: Not on file    Emotionally abused: Not on file    Physically abused: Not on file    Forced sexual activity: Not on file  Other Topics Concern  . Not on file  Social History Narrative  . Not on file    Family History  Problem Relation Age of Onset  . Hypertension Mother   . Diabetes Father   . Cancer Brother   . Breast cancer Cousin       Marti Sleigh, MD 09/10/2018, 9:53 AM

## 2018-09-12 ENCOUNTER — Other Ambulatory Visit: Payer: Self-pay | Admitting: Internal Medicine

## 2018-09-12 DIAGNOSIS — B20 Human immunodeficiency virus [HIV] disease: Secondary | ICD-10-CM

## 2018-09-16 ENCOUNTER — Other Ambulatory Visit: Payer: Medicare Other

## 2018-09-16 DIAGNOSIS — B20 Human immunodeficiency virus [HIV] disease: Secondary | ICD-10-CM

## 2018-09-17 LAB — T-HELPER CELL (CD4) - (RCID CLINIC ONLY)
CD4 % Helper T Cell: 20 % — ABNORMAL LOW (ref 33–55)
CD4 T Cell Abs: 540 /uL (ref 400–2700)

## 2018-09-18 LAB — CBC WITH DIFFERENTIAL/PLATELET
Basophils Absolute: 29 cells/uL (ref 0–200)
Basophils Relative: 0.3 %
Eosinophils Absolute: 144 cells/uL (ref 15–500)
Eosinophils Relative: 1.5 %
HCT: 39.2 % (ref 35.0–45.0)
Hemoglobin: 13.6 g/dL (ref 11.7–15.5)
Lymphs Abs: 2390 cells/uL (ref 850–3900)
MCH: 31.9 pg (ref 27.0–33.0)
MCHC: 34.7 g/dL (ref 32.0–36.0)
MCV: 91.8 fL (ref 80.0–100.0)
MPV: 10.7 fL (ref 7.5–12.5)
Monocytes Relative: 6.8 %
Neutro Abs: 6384 cells/uL (ref 1500–7800)
Neutrophils Relative %: 66.5 %
Platelets: 373 10*3/uL (ref 140–400)
RBC: 4.27 10*6/uL (ref 3.80–5.10)
RDW: 12.7 % (ref 11.0–15.0)
Total Lymphocyte: 24.9 %
WBC mixed population: 653 cells/uL (ref 200–950)
WBC: 9.6 10*3/uL (ref 3.8–10.8)

## 2018-09-18 LAB — BASIC METABOLIC PANEL
BUN/Creatinine Ratio: 11 (calc) (ref 6–22)
BUN: 14 mg/dL (ref 7–25)
CO2: 23 mmol/L (ref 20–32)
Calcium: 9.1 mg/dL (ref 8.6–10.4)
Chloride: 103 mmol/L (ref 98–110)
Creat: 1.24 mg/dL — ABNORMAL HIGH (ref 0.50–1.05)
Glucose, Bld: 178 mg/dL — ABNORMAL HIGH (ref 65–99)
Potassium: 3.9 mmol/L (ref 3.5–5.3)
Sodium: 138 mmol/L (ref 135–146)

## 2018-09-18 LAB — HIV-1 RNA QUANT-NO REFLEX-BLD
HIV 1 RNA Quant: <20 copies/mL
HIV-1 RNA Quant, Log: <1.3 Log copies/mL

## 2018-10-07 ENCOUNTER — Ambulatory Visit (INDEPENDENT_AMBULATORY_CARE_PROVIDER_SITE_OTHER): Payer: Medicare Other | Admitting: Orthopedic Surgery

## 2018-10-17 ENCOUNTER — Ambulatory Visit (INDEPENDENT_AMBULATORY_CARE_PROVIDER_SITE_OTHER): Payer: Medicare Other | Admitting: Orthopedic Surgery

## 2018-10-24 ENCOUNTER — Ambulatory Visit (INDEPENDENT_AMBULATORY_CARE_PROVIDER_SITE_OTHER): Payer: Medicare Other | Admitting: Physician Assistant

## 2018-10-24 ENCOUNTER — Encounter (INDEPENDENT_AMBULATORY_CARE_PROVIDER_SITE_OTHER): Payer: Self-pay | Admitting: Physician Assistant

## 2018-10-24 VITALS — Ht 69.0 in | Wt 264.0 lb

## 2018-10-24 DIAGNOSIS — E1142 Type 2 diabetes mellitus with diabetic polyneuropathy: Secondary | ICD-10-CM

## 2018-10-24 DIAGNOSIS — L97411 Non-pressure chronic ulcer of right heel and midfoot limited to breakdown of skin: Secondary | ICD-10-CM | POA: Diagnosis not present

## 2018-10-24 MED ORDER — MUPIROCIN 2 % EX OINT: 1.0000 "application " | TOPICAL_OINTMENT | Freq: Every day | CUTANEOUS | 0 refills | Status: DC

## 2018-10-24 MED ORDER — DOXYCYCLINE HYCLATE 100 MG PO CAPS: 100.0000 mg | ORAL_CAPSULE | Freq: Two times a day (BID) | ORAL | 1 refills | Status: DC

## 2018-10-24 NOTE — Progress Notes (Signed)
Office Visit Note   Patient: Katrina Wolfe           Date of Birth: [birth year]           MRN: [id number] Visit Date: 10/24/2018              Requested by: Carlyle Basques, MD [address] [place], [place] [address] PCP: Wenda Low, MD  Chief Complaint  Patient presents with  . Right Foot - Wound Check      HPI: The patient is a 52 yo female who is seen for follow up of an ulcer of the plantar surface of the right mid foot.  She reports odor from this ulcer since last week and now notes some swelling of the right foot for the past 2 days. She denies fever or chills or redness of the foot. She is walking with a walker boot with a felt donut in place to try and take pressure off the ulcer. She reports blood sugars in the 200's.    Assessment & Plan: Visit Diagnoses:  1. Midfoot skin ulcer, right, limited to breakdown of skin ([employer])   2. Diabetic polyneuropathy associated with type 2 diabetes mellitus ([place])     Plan: Will start Doxycycline 100 mg BID and bactroban to the right foot ulcer daily after cleaning with Dial soap and water. Follow up early next week. Concerned about the increased depth and odor from the ulcer over the past week. Patient instructed to stay off the right foot as much as possible.   Follow-Up Instructions: Return in about 5 days (around 10/29/2018).   Ortho Exam  Patient is alert, oriented, no adenopathy, well-dressed, normal affect, normal respiratory effort. The right foot ulcer has increased depth this visit with breakdown to the muscle layer now.   Imaging: No results found. No images are attached to the encounter.  Labs: No results found for: HGBA1C, ESRSEDRATE, CRP, LABURIC, REPTSTATUS, GRAMSTAIN, CULT, LABORGA   Lab Results  Component Value Date   ALBUMIN 3.5 (L) 05/24/2017   ALBUMIN 4.2 11/23/2016   ALBUMIN 3.6 08/17/2016    Body mass index is 38.99 kg/m.  Orders:  No orders of the defined types were placed in  this encounter.  Meds ordered this encounter  Medications  . doxycycline (VIBRAMYCIN) 100 MG capsule    Sig: Take 1 capsule (100 mg total) by mouth 2 (two) times daily.    Dispense:  28 capsule    Refill:  1  . mupirocin ointment (BACTROBAN) 2 %    Sig: Apply 1 application topically daily. Apply to right foot ulcer daily after cleanoiong    Dispense:  22 g    Refill:  0     Procedures: No procedures performed  Clinical Data: No additional findings.  ROS:  All other systems negative, except as noted in the HPI. Review of Systems  Objective: Vital Signs: Ht 5\' 9"  (1.753 m)   Wt 264 lb (119.7 kg)   LMP 07/29/2018 (Exact Date)   BMI 38.99 kg/m   Specialty Comments:  No specialty comments available.  PMFS History: Patient Active Problem List   Diagnosis Date Noted  . HIV disease ([place]) 10/27/2015  . DM type 2 (diabetes mellitus, type 2) ([place]) 10/27/2015  . Hyperlipidemia 10/27/2015  . Essential hypertension 10/27/2015  . Severe vulvar dysplasia, histologically confirmed 10/15/2015   Past Medical History:  Diagnosis Date  . Abnormal uterine bleeding (AUB)   . Arthritis    knees  .  Asthma    as child, no problems in 20 yrs  . CKD (chronic kidney disease), stage II   . Congenital cardiomegaly    per pt has always been told since childhood and siblings   . GERD (gastroesophageal reflux disease)   . History of genital warts   . HIV (human immunodeficiency virus infection) ([place])    [place]-- DR Carlyle Basques  . Hypertension   . Intermittent palpitations    mild-- no meds  . Legally blind in left eye, as defined in [place]    trauma as child  . Type 2 diabetes mellitus ([place])   . Uterine fibroid   . VIN III (vulvar intraepithelial neoplasia III)    and Verrucoid lesion on the mons  . Wears glasses     Family History  Problem Relation Age of Onset  . Hypertension Mother   . Diabetes Father   . Cancer Brother   . Breast cancer  Cousin     Past Surgical History:  Procedure Laterality Date  . CESAREAN SECTION  2001  &  1984  . CO2 LASER APPLICATION N/A 67/12/2456   Procedure: CO2 LASER APPLICATION AND WIDE VOCAL EXCSION OF LESION ON MONS PUBIS;  Surgeon: Marti Sleigh, MD;  Location: [facility];  Service: Gynecology;  Laterality: N/A;   CASE CANCELLED   . CO2 LASER APPLICATION N/A 0/99/8338   Procedure: CO2 LASER OF VULVAR;  Surgeon: Marti Sleigh, MD;  Location: [facility];  Service: Gynecology;  Laterality: N/A;  . CORNEAL TRANSPLANT Left 2006   failed  . D & C HYSTEROSCOPY /  RESECTION FIBROID  2004  . KNEE ARTHROSCOPY W/ ACL RECONSTRUCTION Bilateral 2008  . ORIF TOE FRACTURE Left 08/02/2017   Procedure: OPEN REDUCTION INTERNAL FIXATION (ORIF) FIFTH METATARSAL (TOE) BASE FRACTURE;  Surgeon: Wylene Simmer, MD;  Location: [facility];  Service: Orthopedics;  Laterality: Left;  Marland Kitchen VULVECTOMY N/A 01/07/2016   Procedure: WIDE LOCAL EXCISION;  Surgeon: Marti Sleigh, MD;  Location: [facility];  Service: Gynecology;  Laterality: N/A;  mons pubis as site   Social History   Occupational History  . Not on file  Tobacco Use  . Smoking status: Never Smoker  . Smokeless tobacco: Never Used  Substance and Sexual Activity  . Alcohol use: No    Alcohol/week: 0.0 standard drinks  . Drug use: No  . Sexual activity: Yes    Partners: Male    Birth control/protection: None, Condom    Comment: declined condoms - has at home

## 2018-10-29 ENCOUNTER — Encounter (INDEPENDENT_AMBULATORY_CARE_PROVIDER_SITE_OTHER): Payer: Self-pay | Admitting: Orthopedic Surgery

## 2018-10-29 ENCOUNTER — Ambulatory Visit (INDEPENDENT_AMBULATORY_CARE_PROVIDER_SITE_OTHER): Payer: Medicare Other | Admitting: Physician Assistant

## 2018-10-29 VITALS — Ht 69.0 in | Wt 264.0 lb

## 2018-10-29 DIAGNOSIS — L97411 Non-pressure chronic ulcer of right heel and midfoot limited to breakdown of skin: Secondary | ICD-10-CM | POA: Diagnosis not present

## 2018-10-29 DIAGNOSIS — E1142 Type 2 diabetes mellitus with diabetic polyneuropathy: Secondary | ICD-10-CM

## 2018-10-30 ENCOUNTER — Encounter (INDEPENDENT_AMBULATORY_CARE_PROVIDER_SITE_OTHER): Payer: Self-pay | Admitting: Physician Assistant

## 2018-10-30 NOTE — Progress Notes (Addendum)
Office Visit Note   Patient: Katrina Wolfe           Date of Birth: 08-Sep-1966           MRN: [id number] Visit Date: 10/29/2018              Requested by: Wenda Low, MD [address] [place], [place] [address] PCP: Wenda Low, MD  Chief Complaint  Patient presents with  . Right Foot - Pain, Wound Check      HPI: The patient is a 52 yo female who is seen for follow up of a diabetic insensate neuropathy [place] grade 1 ulcer fourth metatarsal head right foot she is currently wearing a fracture boot and a felt relieving donut . She has been on Doxycycline for the past week. She reports some nausea with the Doxycycline, but otherwise feeling in her usual state of health. She is also using some Bactroban ointment to the ulcer. She reports more drainage and odor over the past week as well. She does try to stay off the right foot as much as possible and walks with a walker. She denies any fever or chills or redness over the ulcerated area.   Assessment & Plan: Visit Diagnoses:  1. Midfoot skin ulcer, right, limited to breakdown of skin ([place])   2. Diabetic polyneuropathy associated with type 2 diabetes mellitus ([place])     Plan: After informed consent, the right foot ulcer was debrided for skin and soft tissue and the patient was instructed to continue to off load the area was much as possible, elevate the right foot when able. We are going to change the local wound care to iodosorb gel daily after cleaning the foot. Follow up in 2 weeks.   Follow-Up Instructions: Return in about 2 weeks (around 11/12/2018).   Ortho Exam  Patient is alert, oriented, no adenopathy, well-dressed, normal affect, normal respiratory effort. After informed consent the right plantar foot ulcer was debrided with a #10 blade knife and the residual ulcer is quarter sized with undermining circumferentially. There is scant thin drainage, but some odor.   Imaging: No results found. No images  are attached to the encounter.  Labs: No results found for: HGBA1C, ESRSEDRATE, CRP, LABURIC, REPTSTATUS, GRAMSTAIN, CULT, LABORGA   Lab Results  Component Value Date   ALBUMIN 3.5 (L) 05/24/2017   ALBUMIN 4.2 11/23/2016   ALBUMIN 3.6 08/17/2016    Body mass index is 38.99 kg/m.  Orders:  No orders of the defined types were placed in this encounter.  No orders of the defined types were placed in this encounter.    Procedures: No procedures performed  Clinical Data: No additional findings.  ROS:  All other systems negative, except as noted in the HPI. Review of Systems  Objective: Vital Signs: Ht 5\' 9"  (1.753 m)   Wt 264 lb (119.7 kg)   LMP 07/29/2018 (Exact Date)   BMI 38.99 kg/m   Specialty Comments:  No specialty comments available.  PMFS History: Patient Active Problem List   Diagnosis Date Noted  . HIV disease ([place]) 10/27/2015  . DM type 2 (diabetes mellitus, type 2) ([place]) 10/27/2015  . Hyperlipidemia 10/27/2015  . Essential hypertension 10/27/2015  . Severe vulvar dysplasia, histologically confirmed 10/15/2015   Past Medical History:  Diagnosis Date  . Abnormal uterine bleeding (AUB)   . Arthritis    knees  . Asthma    as child, no problems in 20 yrs  . CKD (chronic kidney  disease), stage II   . Congenital cardiomegaly    per pt has always been told since childhood and siblings   . GERD (gastroesophageal reflux disease)   . History of genital warts   . HIV (human immunodeficiency virus infection) ([place])    [place]-- DR Carlyle Basques  . Hypertension   . Intermittent palpitations    mild-- no meds  . Legally blind in left eye, as defined in [place]    trauma as child  . Type 2 diabetes mellitus ([place])   . Uterine fibroid   . VIN III (vulvar intraepithelial neoplasia III)    and Verrucoid lesion on the mons  . Wears glasses     Family History  Problem Relation Age of Onset  . Hypertension Mother   .  Diabetes Father   . Cancer Brother   . Breast cancer Cousin     Past Surgical History:  Procedure Laterality Date  . CESAREAN SECTION  2001  &  1984  . CO2 LASER APPLICATION N/A 53/06/4826   Procedure: CO2 LASER APPLICATION AND WIDE VOCAL EXCSION OF LESION ON MONS PUBIS;  Surgeon: Marti Sleigh, MD;  Location: [facility];  Service: Gynecology;  Laterality: N/A;   CASE CANCELLED   . CO2 LASER APPLICATION N/A 0/78/6754   Procedure: CO2 LASER OF VULVAR;  Surgeon: Marti Sleigh, MD;  [facility];  Service: Gynecology;  Laterality: N/A;  . CORNEAL TRANSPLANT Left 2006   failed  . D & C HYSTEROSCOPY /  RESECTION FIBROID  2004  . KNEE ARTHROSCOPY W/ ACL RECONSTRUCTION Bilateral 2008  . ORIF TOE FRACTURE Left 08/02/2017   Procedure: OPEN REDUCTION INTERNAL FIXATION (ORIF) FIFTH METATARSAL (TOE) BASE FRACTURE;  Surgeon: Wylene Simmer, MD;  Location: [facility];  Service: Orthopedics;  Laterality: Left;  Marland Kitchen VULVECTOMY N/A 01/07/2016   Procedure: WIDE LOCAL EXCISION;  Surgeon: Marti Sleigh, MD;  Location[facility];  Service: Gynecology;  Laterality: N/A;  mons pubis as site   Social History   Occupational History  . Not on file  Tobacco Use  . Smoking status: Never Smoker  . Smokeless tobacco: Never Used  Substance and Sexual Activity  . Alcohol use: No    Alcohol/week: 0.0 standard drinks  . Drug use: No  . Sexual activity: Yes    Partners: Male    Birth control/protection: None, Condom    Comment: declined condoms - has at home

## 2018-11-12 ENCOUNTER — Ambulatory Visit (INDEPENDENT_AMBULATORY_CARE_PROVIDER_SITE_OTHER): Payer: Medicare Other | Admitting: Physician Assistant

## 2018-11-12 ENCOUNTER — Encounter (INDEPENDENT_AMBULATORY_CARE_PROVIDER_SITE_OTHER): Payer: Self-pay | Admitting: Physician Assistant

## 2018-11-12 VITALS — Ht 69.0 in | Wt 264.0 lb

## 2018-11-12 DIAGNOSIS — L97411 Non-pressure chronic ulcer of right heel and midfoot limited to breakdown of skin: Secondary | ICD-10-CM

## 2018-11-12 DIAGNOSIS — E1142 Type 2 diabetes mellitus with diabetic polyneuropathy: Secondary | ICD-10-CM

## 2018-11-12 MED ORDER — OXYCODONE-ACETAMINOPHEN 5-325 MG PO TABS: 1.0000 | ORAL_TABLET | Freq: Four times a day (QID) | ORAL | 0 refills | Status: DC | PRN

## 2018-11-12 NOTE — Progress Notes (Signed)
Office Visit Note   Patient: Katrina Wolfe           Date of Birth: 12/23/1965           MRN: [id number] Visit Date: 11/12/2018              Requested by: Wenda Low, MD [address] [place], [place] [address] PCP: Wenda Low, MD  Chief Complaint  Patient presents with  . Right Foot - Follow-up      HPI: The patient is a 52 year old female who is seen for follow-up of a diabetic insensate neuropathy in the related Waggoner grade 1 ulcer of the fourth metatarsal head of the right foot.  She continues to wear her fracture boot and a felt donut for pressure relief.  She is completed a course of doxycycline.  She has been using some iodine or Betadine to the wound and the drainage and odor are actually improved but there is still residual maceration about the ulcerated area.  She denies any fever or chills.  She is ambulating with a walker and trying to stay off of the right foot is much as possible.  Assessment & Plan: Visit Diagnoses:  1. Midfoot skin ulcer, right, limited to breakdown of skin ([place])   2. Diabetic polyneuropathy associated with type 2 diabetes mellitus ([place])     Plan: After informed consent the right foot ulcer area was debrided to bleeding tissue with a #10 blade knife and the patient tolerated this well.  We are going to use some Iodosorb gel to the area daily after showering and washing the foot with Dial soap and water.  She will follow-up in about 10 days.  Follow-Up Instructions: Return in about 9 days (around 11/21/2018).   Ortho Exam  Patient is alert, oriented, no adenopathy, well-dressed, normal affect, normal respiratory effort. After informed consent the right foot ulcer over the plantar surface was debrided for skin and soft tissue to healthy bleeding tissue with a #10 blade knife.  Hemostasis was achieved with silver nitrate.  There is less drainage and less odor overall but continued maceration of the periwound area.  The ulcer is  approximately 1 cm in diameter of the central area with some maceration of the periwound area to 2-1/2 cm and this was sharply debrided to healthy appearing skin.  Imaging: No results found. No images are attached to the encounter.  Labs: No results found for: HGBA1C, ESRSEDRATE, CRP, LABURIC, REPTSTATUS, GRAMSTAIN, CULT, LABORGA   Lab Results  Component Value Date   ALBUMIN 3.5 (L) 05/24/2017   ALBUMIN 4.2 11/23/2016   ALBUMIN 3.6 08/17/2016    Body mass index is 38.99 kg/m.  Orders:  No orders of the defined types were placed in this encounter.  Meds ordered this encounter  Medications  . DISCONTD: oxyCODONE-acetaminophen (PERCOCET/ROXICET) 5-325 MG tablet    Sig: Take 1 tablet by mouth every 6 (six) hours as needed for moderate pain or severe pain.    Dispense:  30 tablet    Refill:  0     Procedures: No procedures performed  Clinical Data: No additional findings.  ROS:  All other systems negative, except as noted in the HPI. Review of Systems  Objective: Vital Signs: Ht 5\' 9"  (1.753 m)   Wt 264 lb (119.7 kg)   LMP 07/29/2018 (Exact Date)   BMI 38.99 kg/m   Specialty Comments:  No specialty comments available.  PMFS History: Patient Active Problem List   Diagnosis Date  Noted  . HIV disease ([place]) 10/27/2015  . DM type 2 (diabetes mellitus, type 2) ([place]) 10/27/2015  . Hyperlipidemia 10/27/2015  . Essential hypertension 10/27/2015  . Severe vulvar dysplasia, histologically confirmed 10/15/2015   Past Medical History:  Diagnosis Date  . Abnormal uterine bleeding (AUB)   . Arthritis    knees  . Asthma    as child, no problems in 20 yrs  . CKD (chronic kidney disease), stage II   . Congenital cardiomegaly    per pt has always been told since childhood and siblings   . GERD (gastroesophageal reflux disease)   . History of genital warts   . HIV (human immunodeficiency virus infection) ([place])    [place]-- DR Carlyle Basques  . Hypertension   . Intermittent palpitations    mild-- no meds  . Legally blind in left eye, as defined in [place]    trauma as child  . Type 2 diabetes mellitus ([place])   . Uterine fibroid   . VIN III (vulvar intraepithelial neoplasia III)    and Verrucoid lesion on the mons  . Wears glasses     Family History  Problem Relation Age of Onset  . Hypertension Mother   . Diabetes Father   . Cancer Brother   . Breast cancer Cousin     Past Surgical History:  Procedure Laterality Date  . CESAREAN SECTION  2001  &  1984  . CO2 LASER APPLICATION N/A 16/12/958   Procedure: CO2 LASER APPLICATION AND WIDE VOCAL EXCSION OF LESION ON MONS PUBIS;  Surgeon: Marti Sleigh, MD;  Location: [facility];  Service: Gynecology;  Laterality: N/A;   CASE CANCELLED   . CO2 LASER APPLICATION N/A 4/54/0981   Procedure: CO2 LASER OF VULVAR;  Surgeon: Marti Sleigh, MD;  Location[facility];  Service: Gynecology;  Laterality: N/A;  . CORNEAL TRANSPLANT Left 2006   failed  . D & C HYSTEROSCOPY /  RESECTION FIBROID  2004  . KNEE ARTHROSCOPY W/ ACL RECONSTRUCTION Bilateral 2008  . ORIF TOE FRACTURE Left 08/02/2017   Procedure: OPEN REDUCTION INTERNAL FIXATION (ORIF) FIFTH METATARSAL (TOE) BASE FRACTURE;  Surgeon: Wylene Simmer, MD;  Location: [facility];  Service: Orthopedics;  Laterality: Left;  Marland Kitchen VULVECTOMY N/A 01/07/2016   Procedure: WIDE LOCAL EXCISION;  Surgeon: Marti Sleigh, MD;  Location: [facility];  Service: Gynecology;  Laterality: N/A;  mons pubis as site   Social History   Occupational History  . Not on file  Tobacco Use  . Smoking status: Never Smoker  . Smokeless tobacco: Never Used  Substance and Sexual Activity  . Alcohol use: No    Alcohol/week: 0.0 standard drinks  . Drug use: No  . Sexual activity: Yes    Partners: Male    Birth control/protection: None, Condom    Comment: declined condoms -  has at home

## 2018-11-18 ENCOUNTER — Ambulatory Visit (INDEPENDENT_AMBULATORY_CARE_PROVIDER_SITE_OTHER): Payer: Medicare Other | Admitting: Internal Medicine

## 2018-11-18 ENCOUNTER — Encounter: Payer: Self-pay | Admitting: Internal Medicine

## 2018-11-18 VITALS — BP 149/90 | HR 80 | Temp 97.6°F | Ht 69.5 in | Wt 267.0 lb

## 2018-11-18 DIAGNOSIS — B20 Human immunodeficiency virus [HIV] disease: Secondary | ICD-10-CM | POA: Diagnosis not present

## 2018-11-18 DIAGNOSIS — Z23 Encounter for immunization: Secondary | ICD-10-CM | POA: Diagnosis not present

## 2018-11-18 DIAGNOSIS — L97509 Non-pressure chronic ulcer of other part of unspecified foot with unspecified severity: Secondary | ICD-10-CM

## 2018-11-18 DIAGNOSIS — Z792 Long term (current) use of antibiotics: Secondary | ICD-10-CM

## 2018-11-18 DIAGNOSIS — E11621 Type 2 diabetes mellitus with foot ulcer: Secondary | ICD-10-CM | POA: Diagnosis not present

## 2018-11-18 NOTE — Progress Notes (Signed)
RFV: follow up  For hiv disease  Patient ID: Katrina Wolfe, female   DOB: 17-Jul-1966, 52 y.o.   MRN: [id number]  HPI Katrina Wolfe is a 52yo F with hiv disease, on biktarvy, 540/VL<20 in September. She reports that she is doing well but  has recently had diabetic foot ulcer where she was started doxycycline and continues to get wound care by dr duda. She reports her plantar ulcer looking better. No fever, chills, nightsweats, no purulent drainage on dressing.  Outpatient Encounter Medications as of 11/18/2018  Medication Sig  . aspirin EC 81 MG tablet Take 1 tablet (81 mg total) by mouth 2 (two) times daily.  Marland Kitchen atorvastatin (LIPITOR) 10 MG tablet Take 10 mg by mouth every morning.   . bictegravir-emtricitabine-tenofovir AF (BIKTARVY) 50-200-25 MG TABS tablet Take 1 tablet by mouth daily.  . calcium carbonate (TUMS - DOSED IN MG ELEMENTAL CALCIUM) 500 MG chewable tablet Chew 1 tablet by mouth as needed for indigestion or heartburn.  . Cats Claw, Uncaria tomentosa, 500 MG CAPS Take 2 capsules by mouth every morning.   . cloNIDine (CATAPRES) 0.3 MG tablet Take 0.3 mg by mouth 2 (two) times daily.   . Coconut Oil 1000 MG CAPS Take 2 capsules by mouth daily.  Marland Kitchen docusate sodium (COLACE) 100 MG capsule Take 1 capsule (100 mg total) by mouth 2 (two) times daily. While taking narcotic pain medicine. (Patient taking differently: Take 100 mg by mouth as needed. While taking narcotic pain medicine.)  . doxycycline (VIBRAMYCIN) 100 MG capsule Take 1 capsule (100 mg total) by mouth 2 (two) times daily.  Marland Kitchen gabapentin (NEURONTIN) 100 MG capsule Take 100 mg by mouth 3 (three) times daily.  Marland Kitchen glimepiride (AMARYL) 2 MG tablet Take 2 mg by mouth daily.  Marland Kitchen HUMALOG MIX 75/25 KWIKPEN (75-25) 100 UNIT/ML Kwikpen Inject 56 Units into the skin 2 (two) times daily.   . hydrochlorothiazide (HYDRODIURIL) 25 MG tablet Take 1 tablet (25 mg total) by mouth daily.  . Insulin Syringe-Needle U-100 (INSULIN SYRINGE 1CC/31GX5/16")  31G X 5/16" 1 ML MISC 1 Units by Does not apply route 2 (two) times daily.  . Lancets (ONETOUCH ULTRASOFT) lancets   . lisinopril (PRINIVIL,ZESTRIL) 10 MG tablet Take 1 tablet (10 mg total) by mouth daily.  . metFORMIN (GLUCOPHAGE) 1000 MG tablet Take 1,000 mg by mouth 2 (two) times daily with a meal.   . mupirocin ointment (BACTROBAN) 2 % Apply 1 application topically daily. Apply to right foot ulcer daily after cleanoiong  . ONE TOUCH ULTRA TEST test strip   . senna (SENOKOT) 8.6 MG TABS tablet Take 2 tablets (17.2 mg total) by mouth 2 (two) times daily.  Marland Kitchen VICTOZA 18 MG/3ML SOPN 1.8 mg daily.    No facility-administered encounter medications on file as of 11/18/2018.      Patient Active Problem List   Diagnosis Date Noted  . HIV disease ([place]) 10/27/2015  . DM type 2 (diabetes mellitus, type 2) ([place]) 10/27/2015  . Hyperlipidemia 10/27/2015  . Essential hypertension 10/27/2015  . Severe vulvar dysplasia, histologically confirmed 10/15/2015     Health Maintenance Due  Topic Date Due  . HEMOGLOBIN A1C  25-Mar-1966  . FOOT EXAM  08/16/1976  . OPHTHALMOLOGY EXAM  08/16/1976  . TETANUS/TDAP  08/16/1985  . COLONOSCOPY  08/16/2016  . INFLUENZA VACCINE  07/18/2018     Review of Systems Review of Systems  Constitutional: Negative for fever, chills, diaphoresis, activity change, appetite change, fatigue and unexpected weight change.  HENT:  Negative for congestion, sore throat, rhinorrhea, sneezing, trouble swallowing and sinus pressure.  Eyes: Negative for photophobia and visual disturbance.  Respiratory: Negative for cough, chest tightness, shortness of breath, wheezing and stridor.  Cardiovascular: Negative for chest pain, palpitations and leg swelling.  Gastrointestinal: Negative for nausea, vomiting, abdominal pain, diarrhea, constipation, blood in stool, abdominal distention and anal bleeding.  Genitourinary: Negative for dysuria, hematuria, flank pain and difficulty urinating.    Musculoskeletal: Negative for myalgias, back pain, joint swelling, arthralgias and gait problem.  Skin: +  Draining wound  Neurological: Negative for dizziness, tremors, weakness and light-headedness.  Hematological: Negative for adenopathy. Does not bruise/bleed easily.  Psychiatric/Behavioral: Negative for behavioral problems, confusion, sleep disturbance, dysphoric mood, decreased concentration and agitation.    Physical Exam   BP (!) 149/90   Pulse 80   Temp 97.6 F (36.4 C) (Oral)   Ht 5' 9.5" (1.765 m)   Wt 267 lb (121.1 kg)   LMP 10/22/2018   BMI 38.86 kg/m   Physical Exam  Constitutional:  oriented to person, place, and time. appears well-developed and well-nourished. No distress.  HENT: [place]/AT, PERRLA, no scleral icterus Mouth/Throat: Oropharynx is clear and moist. No oropharyngeal exudate.  Cardiovascular: Normal rate, regular rhythm and normal heart sounds. Exam reveals no gallop and no friction rub.  No murmur heard.  Pulmonary/Chest: Effort normal and breath sounds normal. No respiratory distress.  has no wheezes.  Neck = supple, no nuchal rigidity Abdominal: Soft. Bowel sounds are normal.  exhibits no distension. There is no tenderness.  Lymphadenopathy: no cervical adenopathy. No axillary adenopathy Neurological: alert and oriented to person, place, and time.  Skin: right foot wrapped Psychiatric: a normal mood and affect.  behavior is normal.   Lab Results  Component Value Date   CD4TCELL 20 (L) 09/16/2018   Lab Results  Component Value Date   CD4TABS 540 09/16/2018   CD4TABS 490 07/29/2018   CD4TABS 550 12/06/2017   Lab Results  Component Value Date   HIV1RNAQUANT <20 NOT DETECTED 09/16/2018   Lab Results  Component Value Date   HEPBSAB NEG 10/13/2015   Lab Results  Component Value Date   LABRPR NON-REACTIVE 12/06/2017    CBC Lab Results  Component Value Date   WBC 9.6 09/16/2018   RBC 4.27 09/16/2018   HGB 13.6 09/16/2018   HCT 39.2  09/16/2018   PLT 373 09/16/2018   MCV 91.8 09/16/2018   MCH 31.9 09/16/2018   MCHC 34.7 09/16/2018   RDW 12.7 09/16/2018   LYMPHSABS 2,390 09/16/2018   MONOABS 840 05/24/2017   EOSABS 144 09/16/2018    BMET Lab Results  Component Value Date   NA 138 09/16/2018   K 3.9 09/16/2018   CL 103 09/16/2018   CO2 23 09/16/2018   GLUCOSE 178 (H) 09/16/2018   BUN 14 09/16/2018   CREATININE 1.24 (H) 09/16/2018   CALCIUM 9.1 09/16/2018   GFRNONAA 61 12/06/2017   GFRAA 70 12/06/2017    Assessment and Plan  hiv disease = well controlled. Will see back in 4 months, labs before next visit  Health maintenance = women' s health exam by her gyn is coming up in January. She is due for tdap today. Otherwise is uptodate  Foot ulcer = continue on doxycycline. Has follow up with dr duda this coming week  rtc in 4 months

## 2018-11-18 NOTE — Patient Instructions (Signed)
Please have labs done 2 wk prior to next visit

## 2018-11-21 ENCOUNTER — Ambulatory Visit (INDEPENDENT_AMBULATORY_CARE_PROVIDER_SITE_OTHER): Payer: Self-pay

## 2018-11-21 ENCOUNTER — Ambulatory Visit (INDEPENDENT_AMBULATORY_CARE_PROVIDER_SITE_OTHER): Payer: Medicare Other | Admitting: Physician Assistant

## 2018-11-21 ENCOUNTER — Encounter (INDEPENDENT_AMBULATORY_CARE_PROVIDER_SITE_OTHER): Payer: Self-pay | Admitting: Physician Assistant

## 2018-11-21 VITALS — Ht 69.5 in | Wt 267.0 lb

## 2018-11-21 DIAGNOSIS — E1142 Type 2 diabetes mellitus with diabetic polyneuropathy: Secondary | ICD-10-CM

## 2018-11-21 DIAGNOSIS — L97411 Non-pressure chronic ulcer of right heel and midfoot limited to breakdown of skin: Secondary | ICD-10-CM | POA: Diagnosis not present

## 2018-11-21 MED ORDER — DOXYCYCLINE HYCLATE 100 MG PO CAPS: 100.0000 mg | ORAL_CAPSULE | Freq: Two times a day (BID) | ORAL | 1 refills | Status: DC

## 2018-11-22 ENCOUNTER — Encounter (INDEPENDENT_AMBULATORY_CARE_PROVIDER_SITE_OTHER): Payer: Self-pay | Admitting: Physician Assistant

## 2018-11-22 NOTE — Progress Notes (Addendum)
Office Visit Note   Patient: Katrina Wolfe           Date of Birth: 03-20-1966           MRN: [id number] Visit Date: 11/21/2018              Requested by: Wenda Low, MD [address] [place], [place] [address] PCP: Wenda Low, MD  Chief Complaint  Patient presents with  . Right Foot - Follow-up      HPI: The patient is a 52 year old woman who is seen for follow-up of a diabetic insensate neuropathy with Waggoner grade 1 ulcer of the fourth metatarsal head of the right foot.  She continues to wear fracture boot and he felt donut for pressure relief.  The ulcer has dried [place] and she notes less odor but she is having some new onset of pain over the area.  She does have good pedal pulses.  She has been utilizing Iodosorb gel to the area.  Assessment & Plan: Visit Diagnoses:  1. Midfoot skin ulcer, right, limited to breakdown of skin ([place])   2. Diabetic polyneuropathy associated with type 2 diabetes mellitus ([place])     Plan: We are going to do another course of doxycycline 100 mg p.o. twice daily.  She will continue Iodosorb gel to the area daily after cleansing.  She will follow-up in 1 week or sooner should she have difficulties in the interim.  Follow-Up Instructions: Return in about 1 week (around 11/28/2018).   Ortho Exam  Patient is alert, oriented, no adenopathy, well-dressed, normal affect, normal respiratory effort. The ulcer of the right foot over the plantar surface fourth metatarsal head is dry with no odor but continued maceration and whitening of the periwound area.  The ulcer is approximately 1 cm to 1.5 cm diameter with maceration of the periwound to 2 cm.  The wound tracks to muscle.  There are no signs of cellulitis of the foot or lower extremity.  Imaging: No results found. No images are attached to the encounter.  Labs: No results found for: HGBA1C, ESRSEDRATE, CRP, LABURIC, REPTSTATUS, GRAMSTAIN, CULT, LABORGA   Lab Results    Component Value Date   ALBUMIN 3.5 (L) 05/24/2017   ALBUMIN 4.2 11/23/2016   ALBUMIN 3.6 08/17/2016    Body mass index is 38.86 kg/m.  Orders:  Orders Placed This Encounter  Procedures  . XR Foot Complete Right   Meds ordered this encounter  Medications  . doxycycline (VIBRAMYCIN) 100 MG capsule    Sig: Take 1 capsule (100 mg total) by mouth 2 (two) times daily.    Dispense:  28 capsule    Refill:  1     Procedures: No procedures performed  Clinical Data: No additional findings.  ROS:  All other systems negative, except as noted in the HPI. Review of Systems  Objective: Vital Signs: Ht 5' 9.5" (1.765 m)   Wt 267 lb (121.1 kg)   LMP 10/22/2018   BMI 38.86 kg/m   Specialty Comments:  No specialty comments available.  PMFS History: Patient Active Problem List   Diagnosis Date Noted  . HIV disease ([place]) 10/27/2015  . DM type 2 (diabetes mellitus, type 2) ([place]) 10/27/2015  . Hyperlipidemia 10/27/2015  . Essential hypertension 10/27/2015  . Severe vulvar dysplasia, histologically confirmed 10/15/2015   Past Medical History:  Diagnosis Date  . Abnormal uterine bleeding (AUB)   . Arthritis    knees  . Asthma    as  child, no problems in 20 yrs  . CKD (chronic kidney disease), stage II   . Congenital cardiomegaly    per pt has always been told since childhood and siblings   . GERD (gastroesophageal reflux disease)   . History of genital warts   . HIV (human immunodeficiency virus infection) ([place])    [place]-- DR Carlyle Basques  . Hypertension   . Intermittent palpitations    mild-- no meds  . Legally blind in left eye, as defined in [place]    trauma as child  . Type 2 diabetes mellitus ([place])   . Uterine fibroid   . VIN III (vulvar intraepithelial neoplasia III)    and Verrucoid lesion on the mons  . Wears glasses     Family History  Problem Relation Age of Onset  . Hypertension Mother   . Diabetes Father   .  Cancer Brother   . Breast cancer Cousin     Past Surgical History:  Procedure Laterality Date  . CESAREAN SECTION  2001  &  1984  . CO2 LASER APPLICATION N/A 16/12/958   Procedure: CO2 LASER APPLICATION AND WIDE VOCAL EXCSION OF LESION ON MONS PUBIS;  Surgeon: Marti Sleigh, MD;  Location: [facility];  Service: Gynecology;  Laterality: N/A;   CASE CANCELLED   . CO2 LASER APPLICATION N/A 4/54/0981   Procedure: CO2 LASER OF VULVAR;  Surgeon: Marti Sleigh, MD;  Location: [facility];  Service: Gynecology;  Laterality: N/A;  . CORNEAL TRANSPLANT Left 2006   failed  . D & C HYSTEROSCOPY /  RESECTION FIBROID  2004  . KNEE ARTHROSCOPY W/ ACL RECONSTRUCTION Bilateral 2008  . ORIF TOE FRACTURE Left 08/02/2017   Procedure: OPEN REDUCTION INTERNAL FIXATION (ORIF) FIFTH METATARSAL (TOE) BASE FRACTURE;  Surgeon: Wylene Simmer, MD;  Location: [facility];  Service: Orthopedics;  Laterality: Left;  Marland Kitchen VULVECTOMY N/A 01/07/2016   Procedure: WIDE LOCAL EXCISION;  Surgeon: Marti Sleigh, MD;  Location: [facility];  Service: Gynecology;  Laterality: N/A;  mons pubis as site   Social History   Occupational History  . Not on file  Tobacco Use  . Smoking status: Never Smoker  . Smokeless tobacco: Never Used  Substance and Sexual Activity  . Alcohol use: No    Alcohol/week: 0.0 standard drinks  . Drug use: No  . Sexual activity: Yes    Partners: Male    Birth control/protection: None, Condom    Comment: declined condoms - has at home

## 2018-11-28 ENCOUNTER — Encounter (INDEPENDENT_AMBULATORY_CARE_PROVIDER_SITE_OTHER): Payer: Self-pay | Admitting: Physician Assistant

## 2018-11-28 ENCOUNTER — Ambulatory Visit (INDEPENDENT_AMBULATORY_CARE_PROVIDER_SITE_OTHER): Payer: Medicare Other | Admitting: Physician Assistant

## 2018-11-28 VITALS — Ht 69.5 in | Wt 267.0 lb

## 2018-11-28 DIAGNOSIS — E1142 Type 2 diabetes mellitus with diabetic polyneuropathy: Secondary | ICD-10-CM | POA: Diagnosis not present

## 2018-11-28 DIAGNOSIS — L97411 Non-pressure chronic ulcer of right heel and midfoot limited to breakdown of skin: Secondary | ICD-10-CM | POA: Diagnosis not present

## 2018-11-28 NOTE — Progress Notes (Signed)
Office Visit Note   Patient: Katrina Wolfe           Date of Birth: 12-06-1966           MRN: [id number] Visit Date: 11/28/2018              Requested by: Wenda Low, MD [address] [place], [place] [address] PCP: Wenda Low, MD  Chief Complaint  Patient presents with  . Right Foot - Pain, Follow-up      HPI: The patient is a 52 year old woman who is seen for follow-up of a diabetic insensate Waggoner grade 1 ulcer of the fourth metatarsal head of the right foot.  She is in a fracture boot and utilizing a felt though not for pressure relief.  She has been using Iodosorb gel more recently to the ulcer and reports less drainage and less odor.  She is also been on another course of doxycycline 100 mg p.o. twice daily.  She reports some intermittent swelling about the right foot and ankle.  She is ambulatory with bilateral fracture boots ambulating with a rolling walker.  Assessment & Plan: Visit Diagnoses:  1. Midfoot skin ulcer, right, limited to breakdown of skin ([place])   2. Diabetic polyneuropathy associated with type 2 diabetes mellitus ([place])     Plan: After informed consent the right foot ulcer was debrided to healthy-appearing skin and soft tissue.  She is going to continue doxycycline 100 mg p.o. twice daily.  Continue Iodosorb gel.  Continue to elevate and offload right foot as much possible.  Follow-Up Instructions: Return in about 1 week (around 12/05/2018).   Ortho Exam  Patient is alert, oriented, no adenopathy, well-dressed, normal affect, normal respiratory effort. The right foot ulcer was debrided of callus and soft tissue with a #10 blade knife including the periwound area to bleeding and healthy appearing tissue.  The ulcer tracks to muscle but no deeper I do not see any visible tendon or bone.  The wound measures 1 x 1.5 x 0.3 cm.  There is minimal drainage and mild odor today.  Imaging: No results found. No images are attached to the  encounter.  Labs: No results found for: HGBA1C, ESRSEDRATE, CRP, LABURIC, REPTSTATUS, GRAMSTAIN, CULT, LABORGA   Lab Results  Component Value Date   ALBUMIN 3.5 (L) 05/24/2017   ALBUMIN 4.2 11/23/2016   ALBUMIN 3.6 08/17/2016    Body mass index is 38.86 kg/m.  Orders:  No orders of the defined types were placed in this encounter.  No orders of the defined types were placed in this encounter.    Procedures: No procedures performed  Clinical Data: No additional findings.  ROS:  All other systems negative, except as noted in the HPI. Review of Systems  Objective: Vital Signs: Ht 5' 9.5" (1.765 m)   Wt 267 lb (121.1 kg)   LMP 10/22/2018   BMI 38.86 kg/m   Specialty Comments:  No specialty comments available.  PMFS History: Patient Active Problem List   Diagnosis Date Noted  . HIV disease ([place]) 10/27/2015  . DM type 2 (diabetes mellitus, type 2) ([place]) 10/27/2015  . Hyperlipidemia 10/27/2015  . Essential hypertension 10/27/2015  . Severe vulvar dysplasia, histologically confirmed 10/15/2015   Past Medical History:  Diagnosis Date  . Abnormal uterine bleeding (AUB)   . Arthritis    knees  . Asthma    as child, no problems in 20 yrs  . CKD (chronic kidney disease), stage II   .  Congenital cardiomegaly    per pt has always been told since childhood and siblings   . GERD (gastroesophageal reflux disease)   . History of genital warts   . HIV (human immunodeficiency virus infection) ([place])    [place]-- DR Carlyle Basques  . Hypertension   . Intermittent palpitations    mild-- no meds  . Legally blind in left eye, as defined in [place]    trauma as child  . Type 2 diabetes mellitus ([place])   . Uterine fibroid   . VIN III (vulvar intraepithelial neoplasia III)    and Verrucoid lesion on the mons  . Wears glasses     Family History  Problem Relation Age of Onset  . Hypertension Mother   . Diabetes Father   . Cancer Brother     . Breast cancer Cousin     Past Surgical History:  Procedure Laterality Date  . CESAREAN SECTION  2001  &  1984  . CO2 LASER APPLICATION N/A 65/06/9037   Procedure: CO2 LASER APPLICATION AND WIDE VOCAL EXCSION OF LESION ON MONS PUBIS;  Surgeon: Marti Sleigh, MD;  Location: [facility];  Service: Gynecology;  Laterality: N/A;   CASE CANCELLED   . CO2 LASER APPLICATION N/A 3/33/8329   Procedure: CO2 LASER OF VULVAR;  Surgeon: Marti Sleigh, MD;  Location: [facility];  Service: Gynecology;  Laterality: N/A;  . CORNEAL TRANSPLANT Left 2006   failed  . D & C HYSTEROSCOPY /  RESECTION FIBROID  2004  . KNEE ARTHROSCOPY W/ ACL RECONSTRUCTION Bilateral 2008  . ORIF TOE FRACTURE Left 08/02/2017   Procedure: OPEN REDUCTION INTERNAL FIXATION (ORIF) FIFTH METATARSAL (TOE) BASE FRACTURE;  Surgeon: Wylene Simmer, MD;  Location: [facility];  Service: Orthopedics;  Laterality: Left;  Marland Kitchen VULVECTOMY N/A 01/07/2016   Procedure: WIDE LOCAL EXCISION;  Surgeon: Marti Sleigh, MD;  Location[facility];  Service: Gynecology;  Laterality: N/A;  mons pubis as site   Social History   Occupational History  . Not on file  Tobacco Use  . Smoking status: Never Smoker  . Smokeless tobacco: Never Used  Substance and Sexual Activity  . Alcohol use: No    Alcohol/week: 0.0 standard drinks  . Drug use: No  . Sexual activity: Yes    Partners: Male    Birth control/protection: None, Condom    Comment: declined condoms - has at home

## 2018-11-29 ENCOUNTER — Encounter (INDEPENDENT_AMBULATORY_CARE_PROVIDER_SITE_OTHER): Payer: Self-pay | Admitting: Physician Assistant

## 2018-12-05 ENCOUNTER — Encounter (INDEPENDENT_AMBULATORY_CARE_PROVIDER_SITE_OTHER): Payer: Self-pay | Admitting: Physician Assistant

## 2018-12-05 ENCOUNTER — Ambulatory Visit (INDEPENDENT_AMBULATORY_CARE_PROVIDER_SITE_OTHER): Payer: Medicare Other | Admitting: Orthopedic Surgery

## 2018-12-05 VITALS — Ht 69.5 in | Wt 267.0 lb

## 2018-12-05 DIAGNOSIS — E1142 Type 2 diabetes mellitus with diabetic polyneuropathy: Secondary | ICD-10-CM

## 2018-12-05 DIAGNOSIS — L97411 Non-pressure chronic ulcer of right heel and midfoot limited to breakdown of skin: Secondary | ICD-10-CM

## 2018-12-07 ENCOUNTER — Encounter (INDEPENDENT_AMBULATORY_CARE_PROVIDER_SITE_OTHER): Payer: Self-pay | Admitting: Orthopedic Surgery

## 2018-12-07 NOTE — Progress Notes (Signed)
Office Visit Note   Patient: Katrina Wolfe           Date of Birth: [birth year]           MRN: [id number] Visit Date: 12/05/2018              Requested by: Wenda Low, MD [address] [place], [place] [address] PCP: Wenda Low, MD  Chief Complaint  Patient presents with  . Right Foot - Follow-up, Pain      HPI: Patient is a 52 year old woman diabetic insensate neuropathy Wegner grade 1 ulcer beneath the fourth metatarsal head right foot.  Patient has been weightbearing in a fracture boot with a felt relieving donut to unload pressure.  She states she has had a small amount of drainage.  She is on doxycycline twice a day and she is using Iodosorb for dressing changes daily.  Assessment & Plan: Visit Diagnoses:  1. Midfoot skin ulcer, right, limited to breakdown of skin ([place])   2. Diabetic polyneuropathy associated with type 2 diabetes mellitus ([place])     Plan: The ulcer does not probe to bone again discussed the importance of minimizing weightbearing for healing.  We will have her continue with the Iodosorb dressing changes continue with the pressure relieving boot follow-up in 2 weeks.  Follow-Up Instructions: Return in about 2 weeks (around 12/19/2018).   Ortho Exam  Patient is alert, oriented, no adenopathy, well-dressed, normal affect, normal respiratory effort. Examination patient has an antalgic gait she has a good dorsalis pedis pulse.  She has a Medical illustrator grade 1 ulcer beneath the fourth metatarsal head.  This does not probe to bone.  After informed consent the skin and soft tissue was debrided from around the ulcer this left a wound that was 10 mm in diameter 5 mm deep this does not probe to bone or tendon.  Silver nitrate was used for hemostasis Iodosorb dressing was applied.  Imaging: No results found. No images are attached to the encounter.  Labs: No results found for: HGBA1C, ESRSEDRATE, CRP, LABURIC, REPTSTATUS, GRAMSTAIN, CULT, LABORGA   Lab  Results  Component Value Date   ALBUMIN 3.5 (L) 05/24/2017   ALBUMIN 4.2 11/23/2016   ALBUMIN 3.6 08/17/2016    Body mass index is 38.86 kg/m.  Orders:  No orders of the defined types were placed in this encounter.  No orders of the defined types were placed in this encounter.    Procedures: No procedures performed  Clinical Data: No additional findings.  ROS:  All other systems negative, except as noted in the HPI. Review of Systems  Objective: Vital Signs: Ht 5' 9.5" (1.765 m)   Wt 267 lb (121.1 kg)   LMP 10/22/2018   BMI 38.86 kg/m   Specialty Comments:  No specialty comments available.  PMFS History: Patient Active Problem List   Diagnosis Date Noted  . HIV disease ([place]) 10/27/2015  . DM type 2 (diabetes mellitus, type 2) ([place]) 10/27/2015  . Hyperlipidemia 10/27/2015  . Essential hypertension 10/27/2015  . Severe vulvar dysplasia, histologically confirmed 10/15/2015   Past Medical History:  Diagnosis Date  . Abnormal uterine bleeding (AUB)   . Arthritis    knees  . Asthma    as child, no problems in 20 yrs  . CKD (chronic kidney disease), stage II   . Congenital cardiomegaly    per pt has always been told since childhood and siblings   . GERD (gastroesophageal reflux disease)   . History of  genital warts   . HIV (human immunodeficiency virus infection) ([place])    [place]-- DR Carlyle Basques  . Hypertension   . Intermittent palpitations    mild-- no meds  . Legally blind in left eye, as defined in [place]    trauma as child  . Type 2 diabetes mellitus ([place])   . Uterine fibroid   . VIN III (vulvar intraepithelial neoplasia III)    and Verrucoid lesion on the mons  . Wears glasses     Family History  Problem Relation Age of Onset  . Hypertension Mother   . Diabetes Father   . Cancer Brother   . Breast cancer Cousin     Past Surgical History:  Procedure Laterality Date  . CESAREAN SECTION  2001  &  1984  .  CO2 LASER APPLICATION N/A 69/05/7892   Procedure: CO2 LASER APPLICATION AND WIDE VOCAL EXCSION OF LESION ON MONS PUBIS;  Surgeon: Marti Sleigh, MD;  Location: [facility];  Service: Gynecology;  Laterality: N/A;   CASE CANCELLED   . CO2 LASER APPLICATION N/A 07/27/1750   Procedure: CO2 LASER OF VULVAR;  Surgeon: Marti Sleigh, MD;  Location: [facility];  Service: Gynecology;  Laterality: N/A;  . CORNEAL TRANSPLANT Left 2006   failed  . D & C HYSTEROSCOPY /  RESECTION FIBROID  2004  . KNEE ARTHROSCOPY W/ ACL RECONSTRUCTION Bilateral 2008  . ORIF TOE FRACTURE Left 08/02/2017   Procedure: OPEN REDUCTION INTERNAL FIXATION (ORIF) FIFTH METATARSAL (TOE) BASE FRACTURE;  Surgeon: Wylene Simmer, MD;  Location: [facility];  Service: Orthopedics;  Laterality: Left;  Marland Kitchen VULVECTOMY N/A 01/07/2016   Procedure: WIDE LOCAL EXCISION;  Surgeon: Marti Sleigh, MD;  [facility];  Service: Gynecology;  Laterality: N/A;  mons pubis as site   Social History   Occupational History  . Not on file  Tobacco Use  . Smoking status: Never Smoker  . Smokeless tobacco: Never Used  Substance and Sexual Activity  . Alcohol use: No    Alcohol/week: 0.0 standard drinks  . Drug use: No  . Sexual activity: Yes    Partners: Male    Birth control/protection: None, Condom    Comment: declined condoms - has at home

## 2018-12-19 ENCOUNTER — Ambulatory Visit (INDEPENDENT_AMBULATORY_CARE_PROVIDER_SITE_OTHER): Payer: Medicare Other | Admitting: Physician Assistant

## 2018-12-23 ENCOUNTER — Ambulatory Visit (INDEPENDENT_AMBULATORY_CARE_PROVIDER_SITE_OTHER): Payer: Medicare Other | Admitting: Physician Assistant

## 2018-12-30 ENCOUNTER — Ambulatory Visit (INDEPENDENT_AMBULATORY_CARE_PROVIDER_SITE_OTHER): Payer: Medicare Other | Admitting: Orthopedic Surgery

## 2018-12-30 ENCOUNTER — Encounter (INDEPENDENT_AMBULATORY_CARE_PROVIDER_SITE_OTHER): Payer: Self-pay | Admitting: Physician Assistant

## 2018-12-30 VITALS — Ht 69.5 in | Wt 267.0 lb

## 2018-12-30 DIAGNOSIS — E1142 Type 2 diabetes mellitus with diabetic polyneuropathy: Secondary | ICD-10-CM

## 2018-12-30 DIAGNOSIS — L97411 Non-pressure chronic ulcer of right heel and midfoot limited to breakdown of skin: Secondary | ICD-10-CM

## 2018-12-30 NOTE — Progress Notes (Signed)
Office Visit Note   Patient: Katrina Wolfe           Date of Birth: 1966-09-11           MRN: [id number] Visit Date: 12/30/2018              Requested by: Wenda Low, MD [address] [place], [place] [address] PCP: Wenda Low, MD  Chief Complaint  Patient presents with  . Right Foot - Follow-up      HPI: Patient is a 53 year old woman diabetic insensate neuropathy ambulates with fracture boots bilaterally due to the equina varus contracture both lower extremities patient states she has had recurrent ulceration beneath the fourth metatarsal head of the right foot and is developed an ulcer over the posterior aspect of the left heel.  Assessment & Plan: Visit Diagnoses:  1. Midfoot skin ulcer, right, limited to breakdown of skin ([place])   2. Diabetic polyneuropathy associated with type 2 diabetes mellitus ([place])     Plan: Ulcers were debrided of skin and soft tissue x2.  Continue with protective shoe wear.  Follow-Up Instructions: Return in about 4 weeks (around 01/27/2019).   Ortho Exam  Patient is alert, oriented, no adenopathy, well-dressed, normal affect, normal respiratory effort. Examination patient has a new ulcer over the posterior aspect of the left heel.  After informed consent a 10 blade knife was used to debride the skin and soft tissue back to healthy viable tissue.  Ulcer is 10 mm in diameter 2 mm deep.  Examination the right foot she has a Wegner grade 1 ulcer beneath the fourth metatarsal head.  After informed consent a 10 blade knife was used to debride the skin and soft tissue back to healthy viable granulation tissue the ulcer is 15 mm in diameter 3 mm deep a Band-Aid was applied.  Patient does have equinus varus contractures of both lower extremities with insensate neuropathy.  She has good pulses.  She has an antalgic gait.  Imaging: No results found. No images are attached to the encounter.  Labs: No results found for: HGBA1C,  ESRSEDRATE, CRP, LABURIC, REPTSTATUS, GRAMSTAIN, CULT, LABORGA   Lab Results  Component Value Date   ALBUMIN 3.5 (L) 05/24/2017   ALBUMIN 4.2 11/23/2016   ALBUMIN 3.6 08/17/2016    Body mass index is 38.86 kg/m.  Orders:  No orders of the defined types were placed in this encounter.  No orders of the defined types were placed in this encounter.    Procedures: No procedures performed  Clinical Data: No additional findings.  ROS:  All other systems negative, except as noted in the HPI. Review of Systems  Objective: Vital Signs: Ht 5' 9.5" (1.765 m)   Wt 267 lb (121.1 kg)   LMP 10/22/2018   BMI 38.86 kg/m   Specialty Comments:  No specialty comments available.  PMFS History: Patient Active Problem List   Diagnosis Date Noted  . HIV disease ([place]) 10/27/2015  . DM type 2 (diabetes mellitus, type 2) ([place]) 10/27/2015  . Hyperlipidemia 10/27/2015  . Essential hypertension 10/27/2015  . Severe vulvar dysplasia, histologically confirmed 10/15/2015   Past Medical History:  Diagnosis Date  . Abnormal uterine bleeding (AUB)   . Arthritis    knees  . Asthma    as child, no problems in 20 yrs  . CKD (chronic kidney disease), stage II   . Congenital cardiomegaly    per pt has always been told since childhood and siblings   .  GERD (gastroesophageal reflux disease)   . History of genital warts   . HIV (human immunodeficiency virus infection) ([place])    [place]-- DR Carlyle Basques  . Hypertension   . Intermittent palpitations    mild-- no meds  . Legally blind in left eye, as defined in [place]    trauma as child  . Type 2 diabetes mellitus ([place])   . Uterine fibroid   . VIN III (vulvar intraepithelial neoplasia III)    and Verrucoid lesion on the mons  . Wears glasses     Family History  Problem Relation Age of Onset  . Hypertension Mother   . Diabetes Father   . Cancer Brother   . Breast cancer Cousin     Past Surgical  History:  Procedure Laterality Date  . CESAREAN SECTION  2001  &  1984  . CO2 LASER APPLICATION N/A 70/03/8888   Procedure: CO2 LASER APPLICATION AND WIDE VOCAL EXCSION OF LESION ON MONS PUBIS;  Surgeon: Marti Sleigh, MD;  Location: [facility];  Service: Gynecology;  Laterality: N/A;   CASE CANCELLED   . CO2 LASER APPLICATION N/A 1/69/4503   Procedure: CO2 LASER OF VULVAR;  Surgeon: Marti Sleigh, MD;  Location: [facility];  Service: Gynecology;  Laterality: N/A;  . CORNEAL TRANSPLANT Left 2006   failed  . D & C HYSTEROSCOPY /  RESECTION FIBROID  2004  . KNEE ARTHROSCOPY W/ ACL RECONSTRUCTION Bilateral 2008  . ORIF TOE FRACTURE Left 08/02/2017   Procedure: OPEN REDUCTION INTERNAL FIXATION (ORIF) FIFTH METATARSAL (TOE) BASE FRACTURE;  Surgeon: Wylene Simmer, MD;  Location: [facility];  Service: Orthopedics;  Laterality: Left;  Marland Kitchen VULVECTOMY N/A 01/07/2016   Procedure: WIDE LOCAL EXCISION;  Surgeon: Marti Sleigh, MD;  Location: [facility];  Service: Gynecology;  Laterality: N/A;  mons pubis as site   Social History   Occupational History  . Not on file  Tobacco Use  . Smoking status: Never Smoker  . Smokeless tobacco: Never Used  Substance and Sexual Activity  . Alcohol use: No    Alcohol/week: 0.0 standard drinks  . Drug use: No  . Sexual activity: Yes    Partners: Male    Birth control/protection: None, Condom    Comment: declined condoms - has at home

## 2019-01-07 ENCOUNTER — Other Ambulatory Visit: Payer: Self-pay | Admitting: Obstetrics and Gynecology

## 2019-01-07 ENCOUNTER — Other Ambulatory Visit (HOSPITAL_COMMUNITY)
Admission: RE | Admit: 2019-01-07 | Discharge: 2019-01-07 | Disposition: A | Discharge status: Home / Self Care | Payer: Medicare Other | Source: Ambulatory Visit | Attending: Nurse Practitioner | Admitting: Nurse Practitioner

## 2019-01-07 DIAGNOSIS — Z01419 Encounter for gynecological examination (general) (routine) without abnormal findings: Secondary | ICD-10-CM | POA: Insufficient documentation

## 2019-01-10 LAB — CYTOLOGY - PAP
Diagnosis: NEGATIVE
HPV: NOT DETECTED

## 2019-01-27 ENCOUNTER — Ambulatory Visit (INDEPENDENT_AMBULATORY_CARE_PROVIDER_SITE_OTHER): Payer: Medicare Other | Admitting: Physician Assistant

## 2019-01-27 ENCOUNTER — Encounter (INDEPENDENT_AMBULATORY_CARE_PROVIDER_SITE_OTHER): Payer: Self-pay | Admitting: Physician Assistant

## 2019-01-27 VITALS — Ht 69.0 in | Wt 267.0 lb

## 2019-01-27 DIAGNOSIS — L97411 Non-pressure chronic ulcer of right heel and midfoot limited to breakdown of skin: Secondary | ICD-10-CM

## 2019-01-27 DIAGNOSIS — L84 Corns and callosities: Secondary | ICD-10-CM

## 2019-01-27 DIAGNOSIS — E1142 Type 2 diabetes mellitus with diabetic polyneuropathy: Secondary | ICD-10-CM | POA: Diagnosis not present

## 2019-01-27 NOTE — Progress Notes (Signed)
Office Visit Note   Patient: Katrina Wolfe           Date of Birth: Oct 17, 1966           MRN: [id number] Visit Date: 01/27/2019              Requested by: Wenda Low, MD [address] [place],  [address] PCP: Wenda Low, MD  Chief Complaint  Patient presents with  . Left Foot - Follow-up  . Right Foot - Follow-up      HPI: The patient is a 53 year old woman with insensate diabetic neuropathy who ambulates with fracture boots bilaterally due to equina varus contracture of both lower extremities.  She continues to have ulceration over the right foot fourth metatarsal head over the plantar surface and she has some callus developing over the posterior/lateral aspect of the left heel  Assessment & Plan: Visit Diagnoses:  1. Midfoot skin ulcer, right, limited to breakdown of skin ([place])   2. Diabetic polyneuropathy associated with type 2 diabetes mellitus ([facility])   3. Callus of foot     Plan: After informed consent the right foot plantar surface Waggoner grade 1 ulcer was debrided with a #10 blade knife and the patient tolerated this well.  She will continue to use Iodosorb to the area.  The callus over the left posterior lateral ankle was also debrided and the patient tolerated this well.  She will continue to offload and elevate as much as possible.  She does ambulate with a walker.  She will follow-up in 2 weeks.  Follow-Up Instructions: Return in about 2 weeks (around 02/10/2019).   Ortho Exam  Patient is alert, oriented, no adenopathy, well-dressed, normal affect, normal respiratory effort. The ulcer over the right plantar fourth metatarsal head area is gradually improving.  It is approximately 1 cm in diameter and 1 to 2 mm deep.  After informed consent this was debrided with a #10 blade knife of skin and soft tissue back to healthy viable appearing granulation.  Iodosorb was applied to the area after debridement.  She has good pedal pulse. The left  posterior lateral heel was debrided for thick callus with a #10 blade knife back to healthy appearing tissue.  There is no clear underlying ulceration.  She has good pedal pulse.  Imaging: No results found. No images are attached to the encounter.  Labs: No results found for: HGBA1C, ESRSEDRATE, CRP, LABURIC, REPTSTATUS, GRAMSTAIN, CULT, LABORGA   Lab Results  Component Value Date   ALBUMIN 3.5 (L) 05/24/2017   ALBUMIN 4.2 11/23/2016   ALBUMIN 3.6 08/17/2016    Body mass index is 39.43 kg/m.  Orders:  No orders of the defined types were placed in this encounter.  No orders of the defined types were placed in this encounter.    Procedures: No procedures performed  Clinical Data: No additional findings.  ROS:  All other systems negative, except as noted in the HPI. Review of Systems  Objective: Vital Signs: Ht 5\' 9"  (1.753 m)   Wt 267 lb (121.1 kg)   LMP 10/22/2018   BMI 39.43 kg/m   Specialty Comments:  No specialty comments available.  PMFS History: Patient Active Problem List   Diagnosis Date Noted  . HIV disease ([place]) 10/27/2015  . DM type 2 (diabetes mellitus, type 2) ([place]) 10/27/2015  . Hyperlipidemia 10/27/2015  . Essential hypertension 10/27/2015  . Severe vulvar dysplasia, histologically confirmed 10/15/2015   Past Medical History:  Diagnosis Date  .  Abnormal uterine bleeding (AUB)   . Arthritis    knees  . Asthma    as child, no problems in 20 yrs  . CKD (chronic kidney disease), stage II   . Congenital cardiomegaly    per pt has always been told since childhood and siblings   . GERD (gastroesophageal reflux disease)   . History of genital warts   . HIV (human immunodeficiency virus infection) ([place])    [place]-- DR Carlyle Basques  . Hypertension   . Intermittent palpitations    mild-- no meds  . Legally blind in left eye, as defined in [place]    trauma as child  . Type 2 diabetes mellitus ([place])   .  Uterine fibroid   . VIN III (vulvar intraepithelial neoplasia III)    and Verrucoid lesion on the mons  . Wears glasses     Family History  Problem Relation Age of Onset  . Hypertension Mother   . Diabetes Father   . Cancer Brother   . Breast cancer Cousin     Past Surgical History:  Procedure Laterality Date  . CESAREAN SECTION  2001  &  1984  . CO2 LASER APPLICATION N/A 74/01/5955   Procedure: CO2 LASER APPLICATION AND WIDE VOCAL EXCSION OF LESION ON MONS PUBIS;  Surgeon: Marti Sleigh, MD;  Location: [facility];  Service: Gynecology;  Laterality: N/A;   CASE CANCELLED   . CO2 LASER APPLICATION N/A 3/87/5643   Procedure: CO2 LASER OF VULVAR;  Surgeon: Marti Sleigh, MD;  [facility];  Service: Gynecology;  Laterality: N/A;  . CORNEAL TRANSPLANT Left 2006   failed  . D & C HYSTEROSCOPY /  RESECTION FIBROID  2004  . KNEE ARTHROSCOPY W/ ACL RECONSTRUCTION Bilateral 2008  . ORIF TOE FRACTURE Left 08/02/2017   Procedure: OPEN REDUCTION INTERNAL FIXATION (ORIF) FIFTH METATARSAL (TOE) BASE FRACTURE;  Surgeon: Wylene Simmer, MD;  Location: [facility];  Service: Orthopedics;  Laterality: Left;  Marland Kitchen VULVECTOMY N/A 01/07/2016   Procedure: WIDE LOCAL EXCISION;  Surgeon: Marti Sleigh, MD;  Location: [facility];  Service: Gynecology;  Laterality: N/A;  mons pubis as site   Social History   Occupational History  . Not on file  Tobacco Use  . Smoking status: Never Smoker  . Smokeless tobacco: Never Used  Substance and Sexual Activity  . Alcohol use: No    Alcohol/week: 0.0 standard drinks  . Drug use: No  . Sexual activity: Yes    Partners: Male    Birth control/protection: None, Condom    Comment: declined condoms - has at home

## 2019-02-10 ENCOUNTER — Telehealth (INDEPENDENT_AMBULATORY_CARE_PROVIDER_SITE_OTHER): Payer: Self-pay | Admitting: Physician Assistant

## 2019-02-10 NOTE — Telephone Encounter (Signed)
Pt called asking if Shawn faxed the papers for her medical transportation.

## 2019-02-11 NOTE — Telephone Encounter (Signed)
I called pt and advised that this paperwork was complete and had been faxed 02/03/2019

## 2019-02-13 ENCOUNTER — Telehealth (INDEPENDENT_AMBULATORY_CARE_PROVIDER_SITE_OTHER): Payer: Self-pay | Admitting: Physician Assistant

## 2019-02-13 NOTE — Telephone Encounter (Signed)
I know that we filled out paperwork on this pt and that we faxed it either the early aprt of last week or this week. I can not find it in the chart do you still have a copy? [facility] is saying that they have not received anything.

## 2019-02-13 NOTE — Telephone Encounter (Signed)
Patient called to wanted to make sure the paperwork was faxed to the correct fax#  For [employer] eligibility department  Patient said she spoke with [employer] and they did not receive the paperwork. The fax# is [phone]  The number to contact patient is [phone]

## 2019-02-14 ENCOUNTER — Telehealth (INDEPENDENT_AMBULATORY_CARE_PROVIDER_SITE_OTHER): Payer: Self-pay

## 2019-02-14 NOTE — Telephone Encounter (Signed)
refaxed [phone]

## 2019-02-14 NOTE — Telephone Encounter (Signed)
Called pt to advise that we faxed the paperwork again today. I have the revised paperwork at my desk if she finds that [facility] still does not receive the forms I can mail them to her if she would like.

## 2019-02-17 ENCOUNTER — Ambulatory Visit (INDEPENDENT_AMBULATORY_CARE_PROVIDER_SITE_OTHER): Payer: Medicare Other | Admitting: Physician Assistant

## 2019-02-17 ENCOUNTER — Encounter (INDEPENDENT_AMBULATORY_CARE_PROVIDER_SITE_OTHER): Payer: Self-pay | Admitting: Physician Assistant

## 2019-02-17 VITALS — Ht 69.0 in | Wt 267.0 lb

## 2019-02-17 DIAGNOSIS — E1142 Type 2 diabetes mellitus with diabetic polyneuropathy: Secondary | ICD-10-CM

## 2019-02-17 DIAGNOSIS — L84 Corns and callosities: Secondary | ICD-10-CM

## 2019-02-17 DIAGNOSIS — L97411 Non-pressure chronic ulcer of right heel and midfoot limited to breakdown of skin: Secondary | ICD-10-CM

## 2019-02-17 NOTE — Progress Notes (Signed)
Office Visit Note   Patient: Katrina Wolfe           Date of Birth: 1966-07-29           MRN: [id number] Visit Date: 02/17/2019              Requested by: Wenda Low, MD [address] [place], [place] [address] PCP: Wenda Low, MD  Chief Complaint  Patient presents with  . Right Foot - Follow-up      HPI: Patient is a 53 year old woman with insensate diabetic neuropathy who is seen for follow-up of her right foot ulceration.  She has had a chronic ulceration over the right foot fourth metatarsal head and some callus over the base of the fifth metatarsal.  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  She reports she is doing much better and pleased with her progress.  She has been using some Iodosorb ointment to the ulcerated area on the right foot.  Assessment & Plan: Visit Diagnoses:  1. Midfoot skin ulcer, right, limited to breakdown of skin ([place])   2. Diabetic polyneuropathy associated with type 2 diabetes mellitus ([facility])   3. Callus of foot     Plan: After informed consent the right foot fourth metatarsal head ulcer was debrided with a #10 blade knife and the patient tolerated this well.  Continue Iodosorb ointment to the ulcerated area daily after cleaning with Dial soap and water.  Continue fracture boots for ambulation and offloading and elevation as much as possible.  Follow-up in 4 weeks.  Follow-Up Instructions: Return in about 4 weeks (around 03/17/2019).   Ortho Exam  Patient is alert, oriented, no adenopathy, well-dressed, normal affect, normal respiratory effort. The right foot fourth metatarsal head ulcer was debrided and the residual wound is 6 x 3 x 1 mm without signs of periwound irritation, cellulitis or infection.  There is no odor.  There is scant serous drainage.  There is some new callus without ulceration over the lateral base of the fifth metatarsal on the right foot and this was  debrided as well.  The patient has palpable pedal pulses.  She has clawing of varus contractures of both lower extremities and insensate neuropathy.  She ambulates with a rolling walker.  Imaging: No results found. No images are attached to the encounter.  Labs: No results found for: HGBA1C, ESRSEDRATE, CRP, LABURIC, REPTSTATUS, GRAMSTAIN, CULT, LABORGA   Lab Results  Component Value Date   ALBUMIN 3.5 (L) 05/24/2017   ALBUMIN 4.2 11/23/2016   ALBUMIN 3.6 08/17/2016    Body mass index is 39.43 kg/m.  Orders:  No orders of the defined types were placed in this encounter.  No orders of the defined types were placed in this encounter.    Procedures: No procedures performed  Clinical Data: No additional findings.  ROS:  All other systems negative, except as noted in the HPI. Review of Systems  Objective: Vital Signs: Ht 5\' 9"  (1.753 m)   Wt 267 lb (121.1 kg)   LMP 10/22/2018   BMI 39.43 kg/m   Specialty Comments:  No specialty comments available.  PMFS History: Patient Active Problem List   Diagnosis Date Noted  . HIV disease ([place]) 10/27/2015  . DM type 2 (diabetes mellitus, type 2) ([place]) 10/27/2015  . Hyperlipidemia 10/27/2015  . Essential hypertension 10/27/2015  . Severe vulvar dysplasia, histologically confirmed 10/15/2015   Past Medical History:  Diagnosis Date  .  Abnormal uterine bleeding (AUB)   . Arthritis    knees  . Asthma    as child, no problems in 20 yrs  . CKD (chronic kidney disease), stage II   . Congenital cardiomegaly    per pt has always been told since childhood and siblings   . GERD (gastroesophageal reflux disease)   . History of genital warts   . HIV (human immunodeficiency virus infection) ([place])    [place]-- DR Carlyle Basques  . Hypertension   . Intermittent palpitations    mild-- no meds  . Legally blind in left eye, as defined in [place]    trauma as child  . Type 2 diabetes mellitus ([place])    . Uterine fibroid   . VIN III (vulvar intraepithelial neoplasia III)    and Verrucoid lesion on the mons  . Wears glasses     Family History  Problem Relation Age of Onset  . Hypertension Mother   . Diabetes Father   . Cancer Brother   . Breast cancer Cousin     Past Surgical History:  Procedure Laterality Date  . CESAREAN SECTION  2001  &  1984  . CO2 LASER APPLICATION N/A 36/12/4429   Procedure: CO2 LASER APPLICATION AND WIDE VOCAL EXCSION OF LESION ON MONS PUBIS;  Surgeon: Marti Sleigh, MD;  Location: [facility];  Service: Gynecology;  Laterality: N/A;   CASE CANCELLED   . CO2 LASER APPLICATION N/A 5/40/0867   Procedure: CO2 LASER OF VULVAR;  Surgeon: Marti Sleigh, MD;  Location: [facility];  Service: Gynecology;  Laterality: N/A;  . CORNEAL TRANSPLANT Left 2006   failed  . D & C HYSTEROSCOPY /  RESECTION FIBROID  2004  . KNEE ARTHROSCOPY W/ ACL RECONSTRUCTION Bilateral 2008  . ORIF TOE FRACTURE Left 08/02/2017   Procedure: OPEN REDUCTION INTERNAL FIXATION (ORIF) FIFTH METATARSAL (TOE) BASE FRACTURE;  Surgeon: Wylene Simmer, MD;  Location: [facility];  Service: Orthopedics;  Laterality: Left;  Marland Kitchen VULVECTOMY N/A 01/07/2016   Procedure: WIDE LOCAL EXCISION;  Surgeon: Marti Sleigh, MD;  Location: [facility];  Service: Gynecology;  Laterality: N/A;  mons pubis as site   Social History   Occupational History  . Not on file  Tobacco Use  . Smoking status: Never Smoker  . Smokeless tobacco: Never Used  Substance and Sexual Activity  . Alcohol use: No    Alcohol/week: 0.0 standard drinks  . Drug use: No  . Sexual activity: Yes    Partners: Male    Birth control/protection: None, Condom    Comment: declined condoms - has at home

## 2019-03-01 ENCOUNTER — Other Ambulatory Visit: Payer: Self-pay

## 2019-03-01 ENCOUNTER — Emergency Department (HOSPITAL_COMMUNITY): Payer: Medicare Other

## 2019-03-01 ENCOUNTER — Emergency Department (HOSPITAL_COMMUNITY)
Admission: EM | Admit: 2019-03-01 | Discharge: 2019-03-01 | Disposition: A | Discharge status: Home / Self Care | Payer: Medicare Other | Attending: Emergency Medicine | Admitting: Emergency Medicine

## 2019-03-01 ENCOUNTER — Emergency Department (HOSPITAL_BASED_OUTPATIENT_CLINIC_OR_DEPARTMENT_OTHER): Payer: Medicare Other

## 2019-03-01 ENCOUNTER — Encounter (HOSPITAL_COMMUNITY): Payer: Self-pay | Admitting: *Deleted

## 2019-03-01 DIAGNOSIS — B20 Human immunodeficiency virus [HIV] disease: Secondary | ICD-10-CM | POA: Insufficient documentation

## 2019-03-01 DIAGNOSIS — Z794 Long term (current) use of insulin: Secondary | ICD-10-CM | POA: Diagnosis not present

## 2019-03-01 DIAGNOSIS — Z8544 Personal history of malignant neoplasm of other female genital organs: Secondary | ICD-10-CM | POA: Diagnosis not present

## 2019-03-01 DIAGNOSIS — Y999 Unspecified external cause status: Secondary | ICD-10-CM | POA: Insufficient documentation

## 2019-03-01 DIAGNOSIS — I129 Hypertensive chronic kidney disease with stage 1 through stage 4 chronic kidney disease, or unspecified chronic kidney disease: Secondary | ICD-10-CM | POA: Insufficient documentation

## 2019-03-01 DIAGNOSIS — Z7982 Long term (current) use of aspirin: Secondary | ICD-10-CM | POA: Insufficient documentation

## 2019-03-01 DIAGNOSIS — X58XXXA Exposure to other specified factors, initial encounter: Secondary | ICD-10-CM | POA: Diagnosis not present

## 2019-03-01 DIAGNOSIS — Z79899 Other long term (current) drug therapy: Secondary | ICD-10-CM | POA: Insufficient documentation

## 2019-03-01 DIAGNOSIS — M79662 Pain in left lower leg: Secondary | ICD-10-CM | POA: Diagnosis present

## 2019-03-01 DIAGNOSIS — S81802A Unspecified open wound, left lower leg, initial encounter: Secondary | ICD-10-CM

## 2019-03-01 DIAGNOSIS — E1122 Type 2 diabetes mellitus with diabetic chronic kidney disease: Secondary | ICD-10-CM | POA: Insufficient documentation

## 2019-03-01 DIAGNOSIS — S91302A Unspecified open wound, left foot, initial encounter: Secondary | ICD-10-CM | POA: Insufficient documentation

## 2019-03-01 DIAGNOSIS — Y939 Activity, unspecified: Secondary | ICD-10-CM | POA: Insufficient documentation

## 2019-03-01 DIAGNOSIS — S91301A Unspecified open wound, right foot, initial encounter: Secondary | ICD-10-CM | POA: Insufficient documentation

## 2019-03-01 DIAGNOSIS — Y929 Unspecified place or not applicable: Secondary | ICD-10-CM | POA: Diagnosis not present

## 2019-03-01 DIAGNOSIS — L03116 Cellulitis of left lower limb: Secondary | ICD-10-CM

## 2019-03-01 DIAGNOSIS — R609 Edema, unspecified: Secondary | ICD-10-CM | POA: Diagnosis not present

## 2019-03-01 DIAGNOSIS — N182 Chronic kidney disease, stage 2 (mild): Secondary | ICD-10-CM | POA: Diagnosis not present

## 2019-03-01 LAB — CBC WITH DIFFERENTIAL/PLATELET
Abs Immature Granulocytes: 0.05 10*3/uL (ref 0.00–0.07)
Basophils Absolute: 0 10*3/uL (ref 0.0–0.1)
Basophils Relative: 0 %
Eosinophils Absolute: 0.1 10*3/uL (ref 0.0–0.5)
Eosinophils Relative: 1 %
HCT: 37.2 % (ref 36.0–46.0)
Hemoglobin: 12 g/dL (ref 12.0–15.0)
Immature Granulocytes: 0 %
Lymphocytes Relative: 13 %
Lymphs Abs: 2.2 10*3/uL (ref 0.7–4.0)
MCH: 32.1 pg (ref 26.0–34.0)
MCHC: 32.3 g/dL (ref 30.0–36.0)
MCV: 99.5 fL (ref 80.0–100.0)
Monocytes Absolute: 1 10*3/uL (ref 0.1–1.0)
Monocytes Relative: 6 %
Neutro Abs: 12.9 10*3/uL — ABNORMAL HIGH (ref 1.7–7.7)
Neutrophils Relative %: 80 %
Platelets: 373 10*3/uL (ref 150–400)
RBC: 3.74 MIL/uL — ABNORMAL LOW (ref 3.87–5.11)
RDW: 13.2 % (ref 11.5–15.5)
WBC: 16.3 10*3/uL — ABNORMAL HIGH (ref 4.0–10.5)
nRBC: 0 % (ref 0.0–0.2)

## 2019-03-01 LAB — COMPREHENSIVE METABOLIC PANEL
ALT: 17 U/L (ref 0–44)
AST: 19 U/L (ref 15–41)
Albumin: 3.9 g/dL (ref 3.5–5.0)
Alkaline Phosphatase: 80 U/L (ref 38–126)
Anion gap: 7 (ref 5–15)
BUN: 9 mg/dL (ref 6–20)
CO2: 26 mmol/L (ref 22–32)
Calcium: 8.9 mg/dL (ref 8.9–10.3)
Chloride: 108 mmol/L (ref 98–111)
Creatinine, Ser: 1.01 mg/dL — ABNORMAL HIGH (ref 0.44–1.00)
GFR calc Af Amer: >60 mL/min (ref >60)
GFR calc non Af Amer: >60 mL/min (ref >60)
Glucose, Bld: 123 mg/dL — ABNORMAL HIGH (ref 70–99)
Potassium: 3.4 mmol/L — ABNORMAL LOW (ref 3.5–5.1)
Sodium: 141 mmol/L (ref 135–145)
Total Bilirubin: 0.4 mg/dL (ref 0.3–1.2)
Total Protein: 7.9 g/dL (ref 6.5–8.1)

## 2019-03-01 LAB — PROTIME-INR
INR: 1 (ref 0.8–1.2)
Prothrombin Time: 13.3 seconds (ref 11.4–15.2)

## 2019-03-01 IMAGING — CR LEFT FOOT - COMPLETE 3+ VIEW
3 series · 3 of 3 positions shown · non-contrast
Comparison: [DATE]

CLINICAL DATA: Pain and swelling to the left leg for a few days.
Bleeding to the lateral side of the left foot since this morning.
History of diabetes. Surgery to left foot 2 years ago.

EXAM:
LEFT FOOT - COMPLETE 3+ VIEW

[x foot ap left]
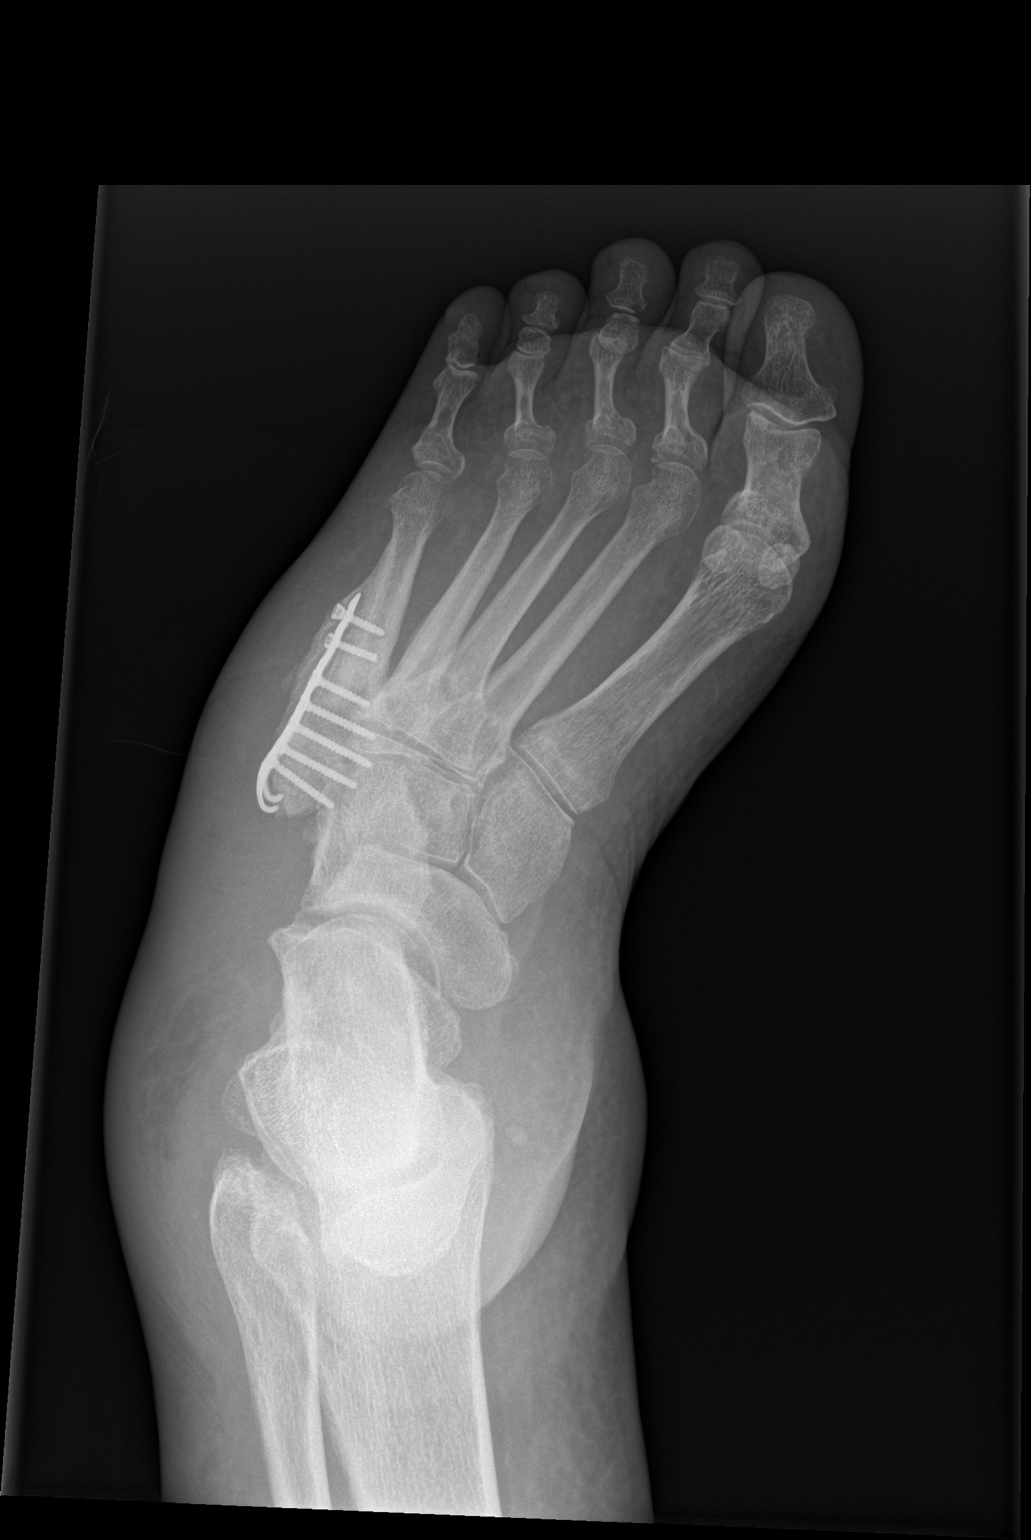

[x foot obl left]
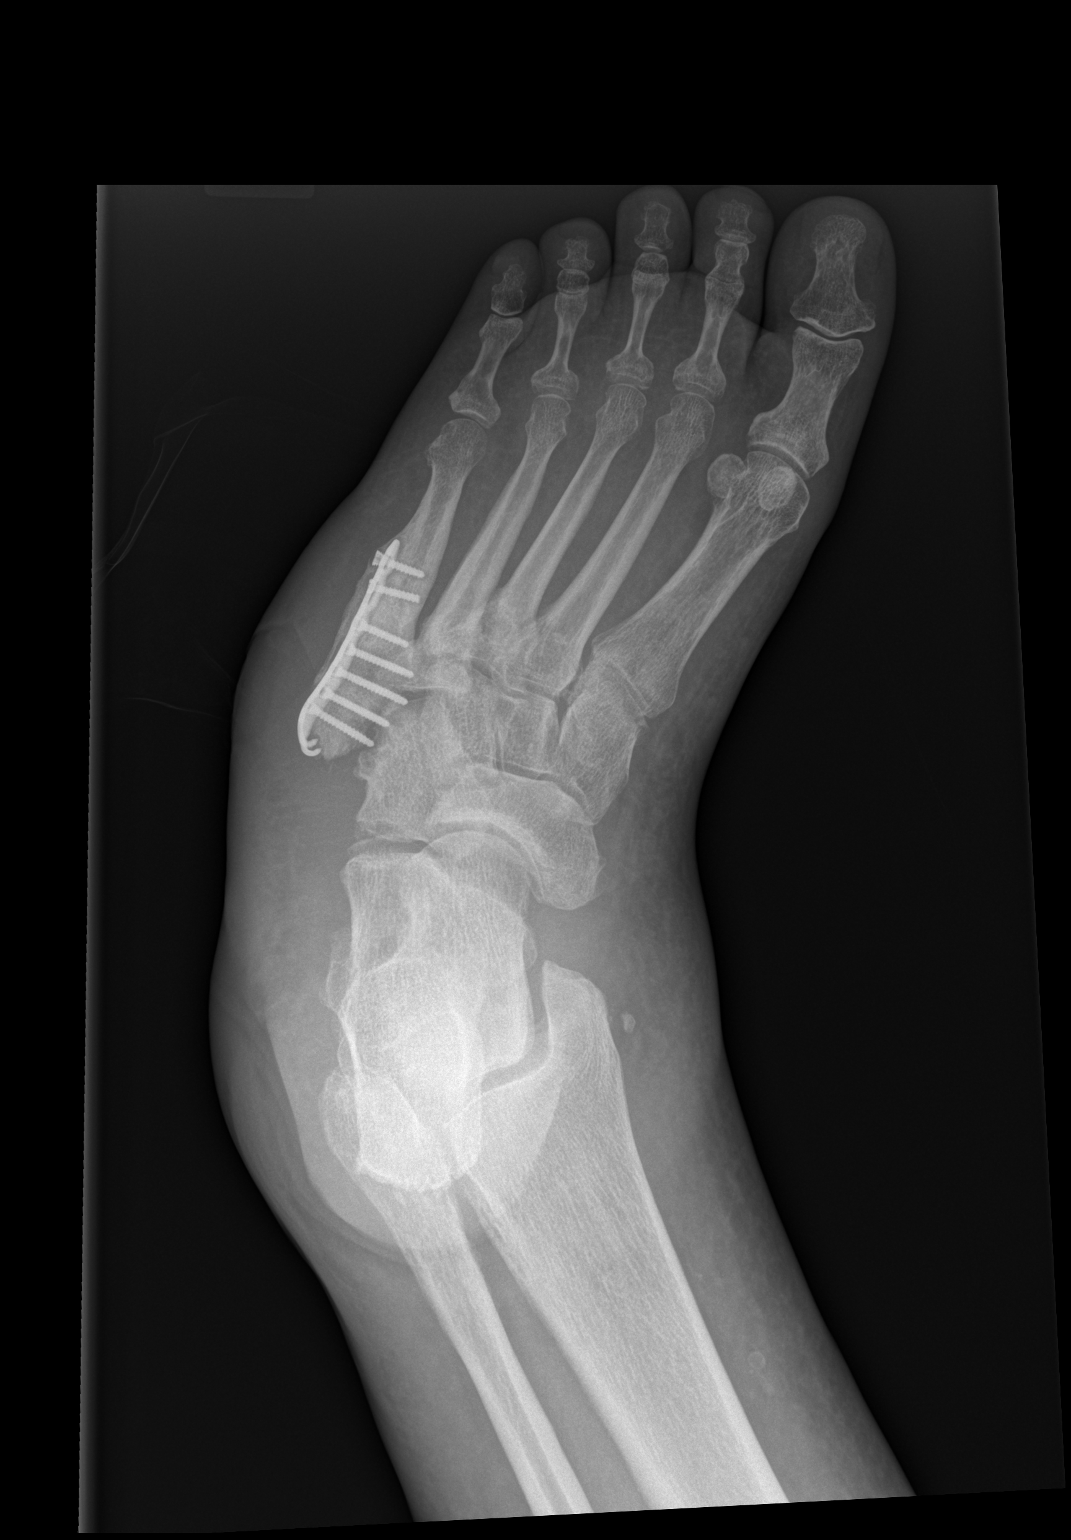

[x foot lat left]
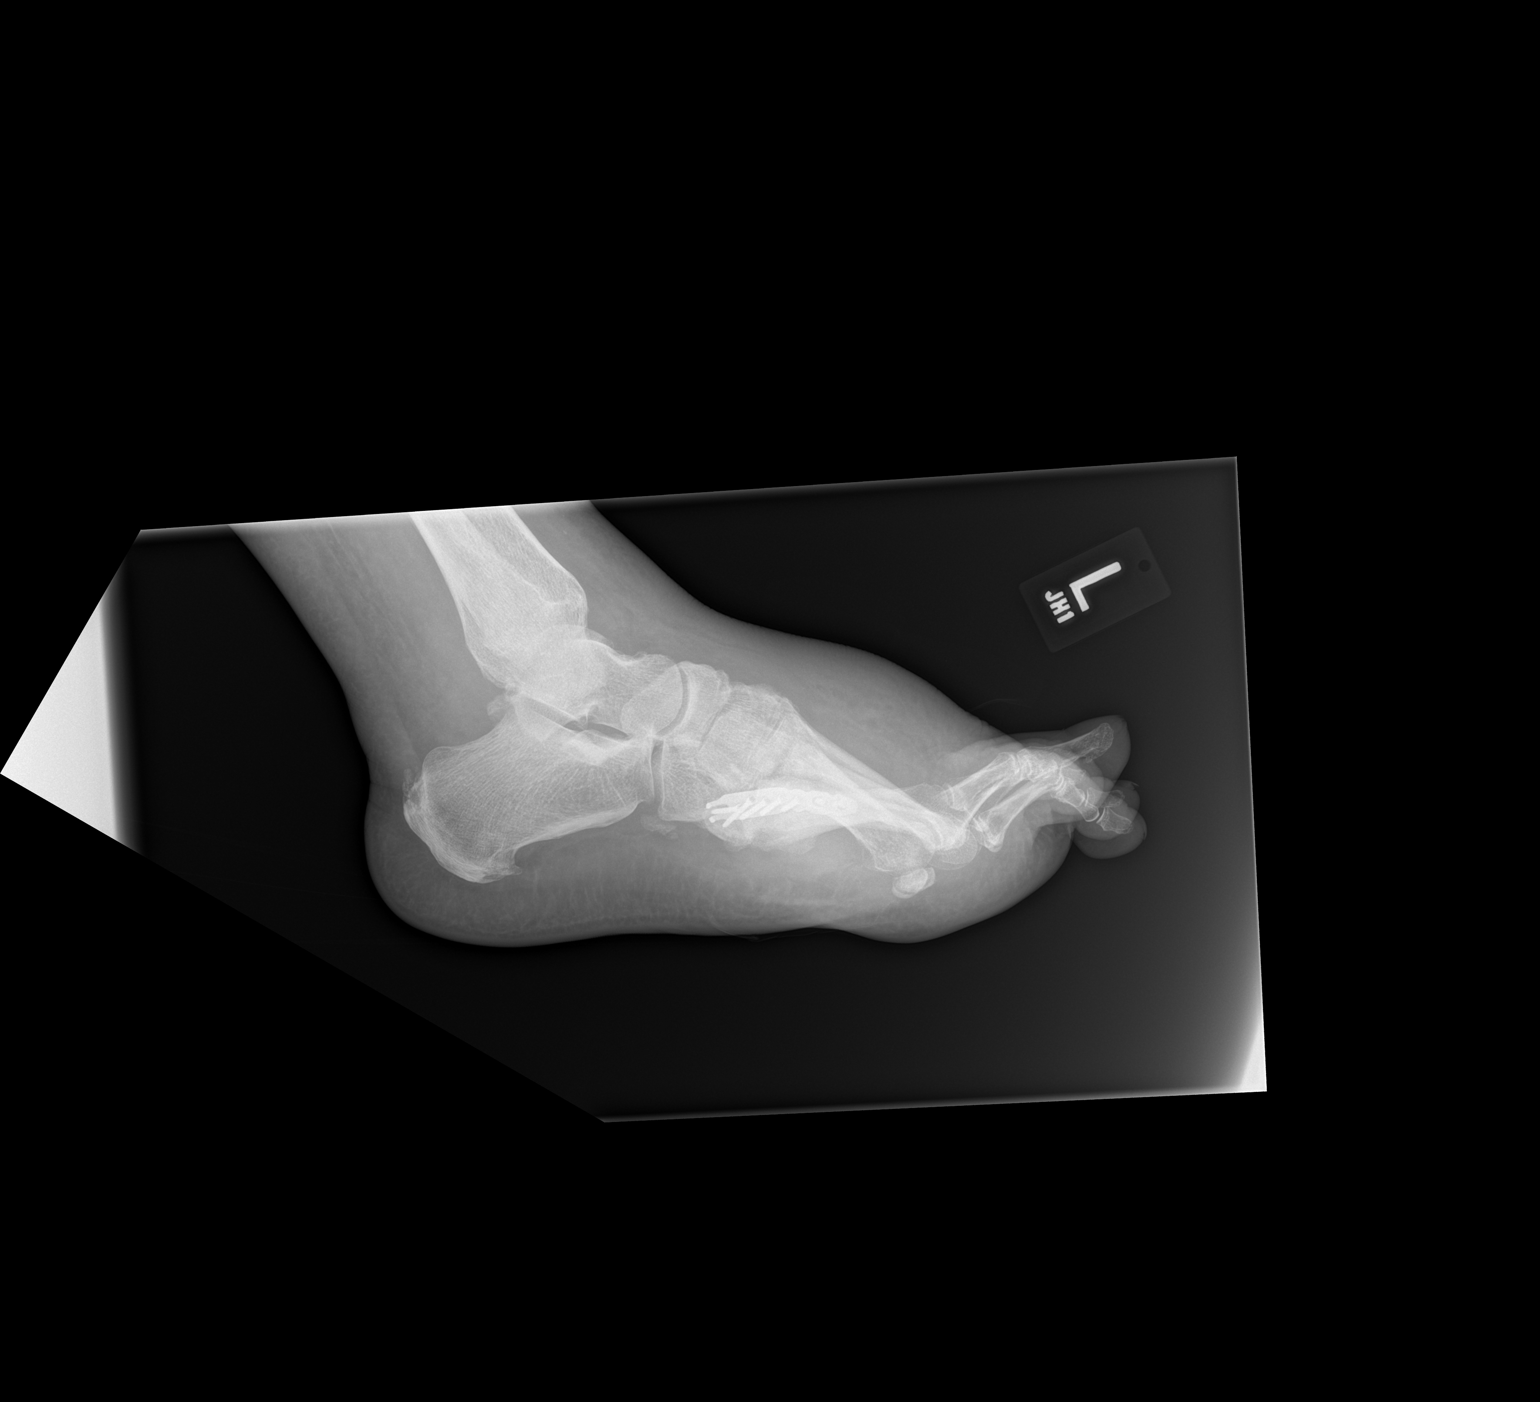

[3 of 3 positions shown; findings below may reference images not displayed]

FINDINGS: Postoperative changes with plate and screw fixation of the proximal
left fifth metatarsal bone. Surgical hardware appears intact. There
is prominent callus formation around the plate. No acute fracture or
dislocation identified. Prominent soft tissue swelling over the left
forefoot. Degenerative changes in the intertarsal joints. No
specific evidence of acute osteomyelitis, but changes of prominent
callus formation around the surgical plate could indicate normal
bone healing or chronic osteomyelitis.
IMPRESSION: Postoperative changes with plate and screw fixation of the proximal
left fifth metatarsal bone. Prominent callus formation around the
surgical p[REDACTED] represent normal bone healing or chronic
osteomyelitis. Prominent soft tissue swelling over the left
forefoot. No acute bony abnormalities.

## 2019-03-01 MED ORDER — DOXYCYCLINE HYCLATE 100 MG PO CAPS: 100.0000 mg | ORAL_CAPSULE | Freq: Two times a day (BID) | ORAL | 0 refills | Status: AC

## 2019-03-01 NOTE — ED Provider Notes (Signed)
[facility] DEPT Provider Note   CSN: [id number] Arrival date & time: 03/01/19  1641    History   Chief Complaint Chief Complaint  Patient presents with  . Leg Swelling  . Leg Pain    HPI Katrina Wolfe is a 53 y.o. female.     HPI 53 year old female with a history of HIV, compliant with her medications with last CD4 count of 540, diabetes, CKD, hypertension presents with concern for left leg swelling and pain.  Reports that for the last 2 days, she is had increasing left leg swelling.  Reports that the pain is primarily towards the back of her left calf, and is rated a brought about a 5 out of 10.  Reports she had a little bit of swelling Wednesday night but it has increased significantly since then.  Reports today she noted some bleeding from a wound on the bottom of her left foot.  Reports she did not notice the wound until yesterday because of the bleeding.  Has neuropathy and does not have pain. Last had her foot looked at 1 month ago.  She has had a history of surgery on her left foot in the past.  Denies fevers, chest pain, shortness of breath.  Reports that she has been ambulatory in her walking boot.   Past Medical History:  Diagnosis Date  . Abnormal uterine bleeding (AUB)   . Arthritis    knees  . Asthma    as child, no problems in 20 yrs  . CKD (chronic kidney disease), stage II   . Congenital cardiomegaly    per pt has always been told since childhood and siblings   . GERD (gastroesophageal reflux disease)   . History of genital warts   . HIV (human immunodeficiency virus infection) ([place])    [place]-- DR Carlyle Basques  . Hypertension   . Intermittent palpitations    mild-- no meds  . Legally blind in left eye, as defined in [place]    trauma as child  . Type 2 diabetes mellitus ([place])   . Uterine fibroid   . VIN III (vulvar intraepithelial neoplasia III)    and Verrucoid lesion on the mons  .  Wears glasses     Patient Active Problem List   Diagnosis Date Noted  . HIV disease ([place]) 10/27/2015  . DM type 2 (diabetes mellitus, type 2) ([place]) 10/27/2015  . Hyperlipidemia 10/27/2015  . Essential hypertension 10/27/2015  . Severe vulvar dysplasia, histologically confirmed 10/15/2015    Past Surgical History:  Procedure Laterality Date  . CESAREAN SECTION  2001  &  1984  . CO2 LASER APPLICATION N/A 50/01/7740   Procedure: CO2 LASER APPLICATION AND WIDE VOCAL EXCSION OF LESION ON MONS PUBIS;  Surgeon: Marti Sleigh, MD;  Location: [facility];  Service: Gynecology;  Laterality: N/A;   CASE CANCELLED   . CO2 LASER APPLICATION N/A 2/87/8676   Procedure: CO2 LASER OF VULVAR;  Surgeon: Marti Sleigh, MD;  Location: [facility];  Service: Gynecology;  Laterality: N/A;  . CORNEAL TRANSPLANT Left 2006   failed  . D & C HYSTEROSCOPY /  RESECTION FIBROID  2004  . KNEE ARTHROSCOPY W/ ACL RECONSTRUCTION Bilateral 2008  . ORIF TOE FRACTURE Left 08/02/2017   Procedure: OPEN REDUCTION INTERNAL FIXATION (ORIF) FIFTH METATARSAL (TOE) BASE FRACTURE;  Surgeon: Wylene Simmer, MD;  Location: [facility];  Service: Orthopedics;  Laterality: Left;  Marland Kitchen VULVECTOMY  N/A 01/07/2016   Procedure: WIDE LOCAL EXCISION;  Surgeon: Marti Sleigh, MD;  Location: [facility];  Service: Gynecology;  Laterality: N/A;  mons pubis as site     OB History   No obstetric history on file.      Home Medications    Prior to Admission medications   Medication Sig Start Date End Date Taking? Authorizing Provider  aspirin EC 81 MG tablet Take 1 tablet (81 mg total) by mouth 2 (two) times daily. 08/02/17  Yes Corky Sing, PA-C  aspirin-acetaminophen-caffeine (EXCEDRIN MIGRAINE) [id number] MG tablet Take 2 tablets by mouth daily as needed for headache or migraine.   Yes [provider]  atorvastatin (LIPITOR) 10 MG tablet Take 10  mg by mouth every morning.  10/12/15  Yes [provider]  bictegravir-emtricitabine-tenofovir AF (BIKTARVY) 50-200-25 MG TABS tablet Take 1 tablet by mouth daily. 08/12/18  Yes Carlyle Basques, MD  calcium carbonate (TUMS - DOSED IN MG ELEMENTAL CALCIUM) 500 MG chewable tablet Chew 1 tablet by mouth as needed for indigestion or heartburn.   Yes [provider]  cloNIDine (CATAPRES) 0.3 MG tablet Take 0.3 mg by mouth 2 (two) times daily.  10/06/15  Yes [provider]  Coconut Oil 1000 MG CAPS Take 2 capsules by mouth daily.   Yes [provider]  docusate sodium (COLACE) 100 MG capsule Take 1 capsule (100 mg total) by mouth 2 (two) times daily. While taking narcotic pain medicine. Patient taking differently: Take 100 mg by mouth as needed. While taking narcotic pain medicine. 08/02/17  Yes Corky Sing, PA-C  gabapentin (NEURONTIN) 100 MG capsule Take 100 mg by mouth 3 (three) times daily.   Yes [provider]  glimepiride (AMARYL) 2 MG tablet Take 2 mg by mouth daily.   Yes [provider]  HUMALOG MIX 75/25 KWIKPEN (75-25) 100 UNIT/ML Kwikpen Inject 56 Units into the skin 2 (two) times daily.  10/06/15  Yes [provider]  hydrochlorothiazide (HYDRODIURIL) 25 MG tablet Take 1 tablet (25 mg total) by mouth daily. 01/02/15  Yes Hess, Robyn M, PA-C  Insulin Syringe-Needle U-100 (INSULIN SYRINGE 1CC/31GX5/16") 31G X 5/16" 1 ML MISC 1 Units by Does not apply route 2 (two) times daily. 11/26/15  Yes Forde Dandy, MD  Lancets [facility] ULTRASOFT) lancets  11/29/15  Yes [provider]  lisinopril (PRINIVIL,ZESTRIL) 10 MG tablet Take 1 tablet (10 mg total) by mouth daily. 01/02/15  Yes Hess, Robyn M, PA-C  metFORMIN (GLUCOPHAGE) 1000 MG tablet Take 1,000 mg by mouth 2 (two) times daily with a meal.  09/09/15  Yes [provider]  mupirocin ointment (BACTROBAN) 2 % Apply 1 application topically daily. Apply to right foot  ulcer daily after cleanoiong 10/24/18  Yes Rayburn, Neta Mends, PA-C  ONE TOUCH ULTRA TEST test strip  11/29/15  Yes [provider]  VICTOZA 18 MG/3ML SOPN 1.8 mg daily.  12/13/17  Yes [provider]  doxycycline (VIBRAMYCIN) 100 MG capsule Take 1 capsule (100 mg total) by mouth 2 (two) times daily for 7 days. 03/01/19 03/08/19  Gareth Morgan, MD    Family History Family History  Problem Relation Age of Onset  . Hypertension Mother   . Diabetes Father   . Cancer Brother   . Breast cancer Cousin     Social History Social History   Tobacco Use  . Smoking status: Never Smoker  . Smokeless tobacco: Never Used  Substance Use Topics  . Alcohol  use: No    Alcohol/week: 0.0 standard drinks  . Drug use: No     Allergies   Shellfish allergy and Bactrim [sulfamethoxazole-trimethoprim]   Review of Systems Review of Systems  Constitutional: Negative for fever.  HENT: Negative for sore throat.   Eyes: Negative for visual disturbance.  Respiratory: Negative for cough and shortness of breath.   Cardiovascular: Positive for leg swelling. Negative for chest pain.  Gastrointestinal: Negative for abdominal pain, nausea and vomiting.  Genitourinary: Negative for difficulty urinating.  Musculoskeletal: Positive for arthralgias. Negative for back pain and neck pain.  Skin: Positive for rash and wound.  Neurological: Negative for syncope, light-headedness and headaches.     Physical Exam Updated Vital Signs BP (!) 160/80   Pulse 88   Temp 98.3 F (36.8 C) (Oral)   Resp 18   LMP 10/22/2018   SpO2 99%   Physical Exam Vitals signs and nursing note reviewed.  Constitutional:      General: She is not in acute distress.    Appearance: She is well-developed. She is not diaphoretic.  HENT:     Head: Normocephalic and atraumatic.  Eyes:     Conjunctiva/sclera: Conjunctivae normal.  Neck:     Musculoskeletal: Normal range of motion.  Cardiovascular:      Rate and Rhythm: Normal rate and regular rhythm.     Heart sounds: Normal heart sounds. No murmur. No friction rub. No gallop.   Pulmonary:     Effort: Pulmonary effort is normal. No respiratory distress.     Breath sounds: Normal breath sounds. No wheezing or rales.  Abdominal:     General: There is no distension.     Palpations: Abdomen is soft.     Tenderness: There is no abdominal tenderness. There is no guarding.  Musculoskeletal:        General: No tenderness.     Left lower leg: Edema present.     Comments: 3x4cm wound left lateral plantar foot Mild erythema/warmth of lower leg with edema extending from lower leg into left foot Right foot with 1cm wound to plantar surface  Skin:    General: Skin is warm and dry.     Findings: No erythema or rash.  Neurological:     Mental Status: She is alert and oriented to person, place, and time.     GCS: GCS eye subscore is 4. GCS verbal subscore is 5. GCS motor subscore is 6.      ED Treatments / Results  Labs (all labs ordered are listed, but only abnormal results are displayed) Labs Reviewed  CBC WITH DIFFERENTIAL/PLATELET - Abnormal; Notable for the following components:      Result Value   WBC 16.3 (*)    RBC 3.74 (*)    Neutro Abs 12.9 (*)    All other components within normal limits  COMPREHENSIVE METABOLIC PANEL - Abnormal; Notable for the following components:   Potassium 3.4 (*)    Glucose, Bld 123 (*)    Creatinine, Ser 1.01 (*)    All other components within normal limits  PROTIME-INR    EKG None  Radiology Dg Foot Complete Left  Result Date: 03/01/2019 CLINICAL DATA:  Pain and swelling to the left leg for a few days. Bleeding to the lateral side of the left foot since this morning. History of diabetes. Surgery to left foot 2 years ago. EXAM: LEFT FOOT - COMPLETE 3+ VIEW COMPARISON:  06/30/2017 FINDINGS: Postoperative changes with plate and screw fixation of the  proximal left fifth metatarsal bone. Surgical  hardware appears intact. There is prominent callus formation around the plate. No acute fracture or dislocation identified. Prominent soft tissue swelling over the left forefoot. Degenerative changes in the intertarsal joints. No specific evidence of acute osteomyelitis, but changes of prominent callus formation around the surgical plate could indicate normal bone healing or chronic osteomyelitis. IMPRESSION: Postoperative changes with plate and screw fixation of the proximal left fifth metatarsal bone. Prominent callus formation around the surgical plate may represent normal bone healing or chronic osteomyelitis. Prominent soft tissue swelling over the left forefoot. No acute bony abnormalities. Electronically Signed   By: Lucienne Capers M.D.   On: 03/01/2019 20:42    Procedures Procedures (including critical care time)  Medications Ordered in ED Medications - No data to display   Initial Impression / Assessment and Plan / ED Course  I have reviewed the triage vital signs and the nursing notes.  Pertinent labs & imaging results that were available during my care of the patient were reviewed by me and considered in my medical decision making (see chart for details).        53 year old female with a history of HIV, compliant with her medications with last CD4 count of 540, diabetes, CKD, hypertension presents with concern for left leg swelling and pain.    She has pulses to the bilateral lower extremities, no sign of acute arterial occlusion.  DVT study is negative.  She has warmth and erythema of the left lower extremity, concerning for possible cellulitis in the absence of DVT.  CBC shows a white blood cell count of 16.3.  X-ray was obtained which shows an area of bone healing versus chronic osteomyelitis.  Discussed with orthopedics, Dr. Lorin Mercy. She had previous left foot surgery 87yrs ago with Dr. Doran Durand but is most recently following up with Dr. Sharol Given.  Discussed that given she is well  appearing, no tachycardia, no hypotension, afebrile feel she is appropriate for trial of outpatient antibiotics with close follow up with Orthopedics for question of chronic osteo versus normal bone healing and Dr. Lorin Mercy in agreement  She is comfortable with this plan and understands reasons to return.   Final Clinical Impressions(s) / ED Diagnoses   Final diagnoses:  Cellulitis of left lower extremity  Wound of left lower extremity, initial encounter    ED Discharge Orders         Ordered    doxycycline (VIBRAMYCIN) 100 MG capsule  2 times daily     03/01/19 2146           Gareth Morgan, MD 03/02/19 [phone]

## 2019-03-01 NOTE — ED Triage Notes (Signed)
Pt complains of swelling and pain to left leg for the past few days. Pt noticed the lateral side of left foot was bleeding this morning. Pt has hx of diabetes and surgery to left foot 2 yeas ago. Pt states she has been wearing a boot for the past 2 years.

## 2019-03-01 NOTE — Progress Notes (Signed)
VASCULAR LAB PRELIMINARY  PRELIMINARY  PRELIMINARY  PRELIMINARY  Left lower extremity venous duplex completed.    Preliminary report:  See CV proc for results  Gave Dr. Billy Fischer preliminary results  Mauro Kaufmann, Harold Moncus, RVT 03/01/2019, 6:57 PM

## 2019-03-10 ENCOUNTER — Other Ambulatory Visit: Payer: Self-pay

## 2019-03-10 ENCOUNTER — Ambulatory Visit (INDEPENDENT_AMBULATORY_CARE_PROVIDER_SITE_OTHER): Payer: Medicare Other | Admitting: Orthopedic Surgery

## 2019-03-10 ENCOUNTER — Encounter (INDEPENDENT_AMBULATORY_CARE_PROVIDER_SITE_OTHER): Payer: Self-pay | Admitting: Orthopedic Surgery

## 2019-03-10 VITALS — Ht 69.0 in | Wt 267.0 lb

## 2019-03-10 DIAGNOSIS — M21372 Foot drop, left foot: Secondary | ICD-10-CM

## 2019-03-10 DIAGNOSIS — I10 Essential (primary) hypertension: Secondary | ICD-10-CM

## 2019-03-10 DIAGNOSIS — E11621 Type 2 diabetes mellitus with foot ulcer: Secondary | ICD-10-CM | POA: Diagnosis not present

## 2019-03-10 DIAGNOSIS — B20 Human immunodeficiency virus [HIV] disease: Secondary | ICD-10-CM

## 2019-03-10 DIAGNOSIS — L97521 Non-pressure chronic ulcer of other part of left foot limited to breakdown of skin: Secondary | ICD-10-CM

## 2019-03-10 DIAGNOSIS — M21371 Foot drop, right foot: Secondary | ICD-10-CM | POA: Diagnosis not present

## 2019-03-10 DIAGNOSIS — E785 Hyperlipidemia, unspecified: Secondary | ICD-10-CM

## 2019-03-10 NOTE — Progress Notes (Signed)
Office Visit Note   Patient: Katrina Wolfe           Date of Birth: June 10, 1966           MRN: [id number] Visit Date: 03/10/2019              Requested by: Wenda Low, MD [address] [place], [place] [address] PCP: Wenda Low, MD  Chief Complaint  Patient presents with  . Left Foot - Wound Check      HPI: Patient is a 53 year old woman with bilateral foot drop cavovarus deformity wearing a fracture boot on the left that was recently seen in the emergency room for a new ulcer over the base of the fifth metatarsal left foot.  Patient is completed a course of doxycycline for 1 week.  Assessment & Plan: Visit Diagnoses:  1. Non-pressure chronic ulcer of other part of left foot limited to breakdown of skin ([place])     Plan: We will have her continue with Iodosorb dressing changes for this new ulcer wear a felt donut at all times with an Ace wrap in the fracture boot follow-up in 2 weeks.  Discussed that if were not showing any improvement we may need to proceed with some type of casting.    Follow-Up Instructions: Return in about 2 weeks (around 03/24/2019).   Ortho Exam  Patient is alert, oriented, no adenopathy, well-dressed, normal affect, normal respiratory effort. Examination patient has a cavovarus foot drop on the left.  She has a new large ulcer over the base of the fifth metatarsal status post ORIF of the fifth metatarsal about 2 years ago.  Recent radiographs shows soft tissue defect but no destructive bony changes.  Patient has a good dorsalis pedis pulse there is no cellulitis no drainage no fluctuance no signs of abscess or infection.  She does have a cavovarus foot drop.  After informed consent a 10 blade knife was used to debride the skin and soft tissue back to healthy viable bleeding tissue.  Silver nitrate was used for hemostasis the ulcer is 3 cm in diameter 2 mm deep.  Iodosorb 2 x 2 felt donut and Ace wrap was applied.  Imaging: No results  found. No images are attached to the encounter.  Labs: No results found for: HGBA1C, ESRSEDRATE, CRP, LABURIC, REPTSTATUS, GRAMSTAIN, CULT, LABORGA   Lab Results  Component Value Date   ALBUMIN 3.9 03/01/2019   ALBUMIN 3.5 (L) 05/24/2017   ALBUMIN 4.2 11/23/2016    Body mass index is 39.43 kg/m.  Orders:  No orders of the defined types were placed in this encounter.  No orders of the defined types were placed in this encounter.    Procedures: No procedures performed  Clinical Data: No additional findings.  ROS:  All other systems negative, except as noted in the HPI. Review of Systems  Objective: Vital Signs: Ht 5\' 9"  (1.753 m)   Wt 267 lb (121.1 kg)   LMP 10/22/2018   BMI 39.43 kg/m   Specialty Comments:  No specialty comments available.  PMFS History: Patient Active Problem List   Diagnosis Date Noted  . HIV disease ([place]) 10/27/2015  . DM type 2 (diabetes mellitus, type 2) ([place]) 10/27/2015  . Hyperlipidemia 10/27/2015  . Essential hypertension 10/27/2015  . Severe vulvar dysplasia, histologically confirmed 10/15/2015   Past Medical History:  Diagnosis Date  . Abnormal uterine bleeding (AUB)   . Arthritis    knees  . Asthma    as  child, no problems in 20 yrs  . CKD (chronic kidney disease), stage II   . Congenital cardiomegaly    per pt has always been told since childhood and siblings   . GERD (gastroesophageal reflux disease)   . History of genital warts   . HIV (human immunodeficiency virus infection) ([place])    [place]-- DR Carlyle Basques  . Hypertension   . Intermittent palpitations    mild-- no meds  . Legally blind in left eye, as defined in [place]    trauma as child  . Type 2 diabetes mellitus ([place])   . Uterine fibroid   . VIN III (vulvar intraepithelial neoplasia III)    and Verrucoid lesion on the mons  . Wears glasses     Family History  Problem Relation Age of Onset  . Hypertension Mother   .  Diabetes Father   . Cancer Brother   . Breast cancer Cousin     Past Surgical History:  Procedure Laterality Date  . CESAREAN SECTION  2001  &  1984  . CO2 LASER APPLICATION N/A 64/02/8380   Procedure: CO2 LASER APPLICATION AND WIDE VOCAL EXCSION OF LESION ON MONS PUBIS;  Surgeon: Marti Sleigh, MD;  Location: [facility];  Service: Gynecology;  Laterality: N/A;   CASE CANCELLED   . CO2 LASER APPLICATION N/A 8/40/3754   Procedure: CO2 LASER OF VULVAR;  Surgeon: Marti Sleigh, MD;  Location: [facility];  Service: Gynecology;  Laterality: N/A;  . CORNEAL TRANSPLANT Left 2006   failed  . D & C HYSTEROSCOPY /  RESECTION FIBROID  2004  . KNEE ARTHROSCOPY W/ ACL RECONSTRUCTION Bilateral 2008  . ORIF TOE FRACTURE Left 08/02/2017   Procedure: OPEN REDUCTION INTERNAL FIXATION (ORIF) FIFTH METATARSAL (TOE) BASE FRACTURE;  Surgeon: Wylene Simmer, MD;  Location: [facility];  Service: Orthopedics;  Laterality: Left;  Marland Kitchen VULVECTOMY N/A 01/07/2016   Procedure: WIDE LOCAL EXCISION;  Surgeon: Marti Sleigh, MD;  Location: [facility];  Service: Gynecology;  Laterality: N/A;  mons pubis as site   Social History   Occupational History  . Not on file  Tobacco Use  . Smoking status: Never Smoker  . Smokeless tobacco: Never Used  Substance and Sexual Activity  . Alcohol use: No    Alcohol/week: 0.0 standard drinks  . Drug use: No  . Sexual activity: Yes    Partners: Male    Birth control/protection: None, Condom    Comment: declined condoms - has at home

## 2019-03-19 ENCOUNTER — Ambulatory Visit (INDEPENDENT_AMBULATORY_CARE_PROVIDER_SITE_OTHER): Payer: Medicare Other | Admitting: Family

## 2019-03-20 ENCOUNTER — Other Ambulatory Visit: Payer: Self-pay

## 2019-03-20 ENCOUNTER — Other Ambulatory Visit: Payer: Medicare Other

## 2019-03-20 ENCOUNTER — Telehealth (INDEPENDENT_AMBULATORY_CARE_PROVIDER_SITE_OTHER): Payer: Self-pay | Admitting: Radiology

## 2019-03-20 DIAGNOSIS — B20 Human immunodeficiency virus [HIV] disease: Secondary | ICD-10-CM

## 2019-03-20 NOTE — Telephone Encounter (Signed)
Called and spoke with patient, patient answered NO to all pre screening questions for appointment on 4/6

## 2019-03-21 LAB — T-HELPER CELL (CD4) - (RCID CLINIC ONLY)
CD4 % Helper T Cell: 24 % — ABNORMAL LOW (ref 33–55)
CD4 T Cell Abs: 590 /uL (ref 400–2700)

## 2019-03-24 ENCOUNTER — Encounter (INDEPENDENT_AMBULATORY_CARE_PROVIDER_SITE_OTHER): Payer: Self-pay | Admitting: Orthopedic Surgery

## 2019-03-24 ENCOUNTER — Ambulatory Visit (INDEPENDENT_AMBULATORY_CARE_PROVIDER_SITE_OTHER): Payer: Medicare Other | Admitting: Physician Assistant

## 2019-03-24 ENCOUNTER — Other Ambulatory Visit: Payer: Self-pay

## 2019-03-24 VITALS — Ht 69.0 in | Wt 267.0 lb

## 2019-03-24 DIAGNOSIS — L97411 Non-pressure chronic ulcer of right heel and midfoot limited to breakdown of skin: Secondary | ICD-10-CM

## 2019-03-24 DIAGNOSIS — L97522 Non-pressure chronic ulcer of other part of left foot with fat layer exposed: Secondary | ICD-10-CM

## 2019-03-24 DIAGNOSIS — Z794 Long term (current) use of insulin: Secondary | ICD-10-CM

## 2019-03-24 DIAGNOSIS — E1142 Type 2 diabetes mellitus with diabetic polyneuropathy: Secondary | ICD-10-CM | POA: Diagnosis not present

## 2019-03-24 MED ORDER — HYDROCODONE-ACETAMINOPHEN 5-325 MG PO TABS: 1.0000 | ORAL_TABLET | Freq: Four times a day (QID) | ORAL | 0 refills | Status: DC | PRN

## 2019-03-24 NOTE — Progress Notes (Signed)
Office Visit Note   Patient: Katrina Wolfe           Date of Birth: [birth year]           MRN: [id number] Visit Date: 03/24/2019              Requested by: Wenda Low, MD [address] [place], [place] [address] PCP: Wenda Low, MD  Chief Complaint  Patient presents with  . Left Foot - Follow-up      HPI: The patient is a 53 year old woman with insensate diabetic neuropathy who is seen for follow-up of her bilateral foot ulcerations.  She has had chronic ulceration over the right foot fourth metatarsal head and some callus over the base of the fifth metatarsal and this has improved.  Her equina varus on the left foot however is worsened and she developed a large ulceration over the lateral foot.  We have been utilizing Iodosorb dressing changes to the areas Ace wrapping and fracture boots but she continues to walk in severe equina varus over the left foot.  Assessment & Plan: Visit Diagnoses:  1. Ulcer of left foot, with fat layer exposed ([place])   2. Midfoot skin ulcer, right, limited to breakdown of skin ([place])   3. Type 2 diabetes mellitus with diabetic polyneuropathy, with long-term current use of insulin ([facility])     Plan: The left foot was placed in a short leg cast with the foot as close to neutral positioning as possible, with a felt donut over the left lateral foot ulceration and silver alginate over this area.  She can walk with a fracture boot.  On the right side she can continue with Iodosorb dressing changes although these areas are much improved and nearly resolved.  Follow-Up Instructions: Return in about 1 week (around 03/31/2019).   Ortho Exam  Patient is alert, oriented, no adenopathy, well-dressed, normal affect, normal respiratory effort. The right foot ulcerations have nearly healed.  She does have some callus over the fourth and fifth metatarsals and these were debrided but they appear to be doing well.  We did place her in a silver compression  stocking for the right foot.  The left foot is in severe equina varus and she has a large ulceration with out periwound signs of infection or cellulitis in the foot.  The ulcer is about 3 cm in diameter with a pink wound bed with moderate serous appearing drainage.  She has palpable pedal pulses.  Imaging: No results found. No images are attached to the encounter.  Labs: No results found for: HGBA1C, ESRSEDRATE, CRP, LABURIC, REPTSTATUS, GRAMSTAIN, CULT, LABORGA   Lab Results  Component Value Date   ALBUMIN 3.9 03/01/2019   ALBUMIN 3.5 (L) 05/24/2017   ALBUMIN 4.2 11/23/2016    Body mass index is 39.43 kg/m.  Orders:  No orders of the defined types were placed in this encounter.  Meds ordered this encounter  Medications  . HYDROcodone-acetaminophen (NORCO/VICODIN) 5-325 MG tablet    Sig: Take 1 tablet by mouth every 6 (six) hours as needed for moderate pain.    Dispense:  30 tablet    Refill:  0     Procedures: No procedures performed  Clinical Data: No additional findings.  ROS:  All other systems negative, except as noted in the HPI. Review of Systems  Objective: Vital Signs: Ht 5\' 9"  (1.753 m)   Wt 267 lb (121.1 kg)   LMP 10/22/2018   BMI 39.43 kg/m  Specialty Comments:  No specialty comments available.  PMFS History: Patient Active Problem List   Diagnosis Date Noted  . HIV disease ([place]) 10/27/2015  . DM type 2 (diabetes mellitus, type 2) ([place]) 10/27/2015  . Hyperlipidemia 10/27/2015  . Essential hypertension 10/27/2015  . Severe vulvar dysplasia, histologically confirmed 10/15/2015   Past Medical History:  Diagnosis Date  . Abnormal uterine bleeding (AUB)   . Arthritis    knees  . Asthma    as child, no problems in 20 yrs  . CKD (chronic kidney disease), stage II   . Congenital cardiomegaly    per pt has always been told since childhood and siblings   . GERD (gastroesophageal reflux disease)   . History of genital warts   . HIV (human  immunodeficiency virus infection) ([place])    [place]-- DR Carlyle Basques  . Hypertension   . Intermittent palpitations    mild-- no meds  . Legally blind in left eye, as defined in [place]    trauma as child  . Type 2 diabetes mellitus ([place])   . Uterine fibroid   . VIN III (vulvar intraepithelial neoplasia III)    and Verrucoid lesion on the mons  . Wears glasses     Family History  Problem Relation Age of Onset  . Hypertension Mother   . Diabetes Father   . Cancer Brother   . Breast cancer Cousin     Past Surgical History:  Procedure Laterality Date  . CESAREAN SECTION  2001  &  1984  . CO2 LASER APPLICATION N/A 37/0/4888   Procedure: CO2 LASER APPLICATION AND WIDE VOCAL EXCSION OF LESION ON MONS PUBIS;  Surgeon: Marti Sleigh, MD;  Location: [facility];  Service: Gynecology;  Laterality: N/A;   CASE CANCELLED   . CO2 LASER APPLICATION N/A 09/02/9449   Procedure: CO2 LASER OF VULVAR;  Surgeon: Marti Sleigh, MD;  Location: [facility];  Service: Gynecology;  Laterality: N/A;  . CORNEAL TRANSPLANT Left 2006   failed  . D & C HYSTEROSCOPY /  RESECTION FIBROID  2004  . KNEE ARTHROSCOPY W/ ACL RECONSTRUCTION Bilateral 2008  . ORIF TOE FRACTURE Left 08/02/2017   Procedure: OPEN REDUCTION INTERNAL FIXATION (ORIF) FIFTH METATARSAL (TOE) BASE FRACTURE;  Surgeon: Wylene Simmer, MD;  Location: [facility];  Service: Orthopedics;  Laterality: Left;  Marland Kitchen VULVECTOMY N/A 01/07/2016   Procedure: WIDE LOCAL EXCISION;  Surgeon: Marti Sleigh, MD;  Location: [facility];  Service: Gynecology;  Laterality: N/A;  mons pubis as site   Social History   Occupational History  . Not on file  Tobacco Use  . Smoking status: Never Smoker  . Smokeless tobacco: Never Used  Substance and Sexual Activity  . Alcohol use: No    Alcohol/week: 0.0 standard drinks  . Drug use: No  . Sexual activity:  Yes    Partners: Male    Birth control/protection: None, Condom    Comment: declined condoms - has at home

## 2019-03-26 ENCOUNTER — Encounter (INDEPENDENT_AMBULATORY_CARE_PROVIDER_SITE_OTHER): Payer: Self-pay | Admitting: Physician Assistant

## 2019-03-28 LAB — RPR: RPR Ser Ql: NONREACTIVE

## 2019-03-28 LAB — CBC WITH DIFFERENTIAL/PLATELET
Absolute Monocytes: 637 cells/uL (ref 200–950)
Basophils Absolute: 43 cells/uL (ref 0–200)
Basophils Relative: 0.4 %
Eosinophils Absolute: 205 cells/uL (ref 15–500)
Eosinophils Relative: 1.9 %
HCT: 34.8 % — ABNORMAL LOW (ref 35.0–45.0)
Hemoglobin: 11.8 g/dL (ref 11.7–15.5)
Lymphs Abs: 2495 cells/uL (ref 850–3900)
MCH: 31.2 pg (ref 27.0–33.0)
MCHC: 33.9 g/dL (ref 32.0–36.0)
MCV: 92.1 fL (ref 80.0–100.0)
MPV: 10.9 fL (ref 7.5–12.5)
Monocytes Relative: 5.9 %
Neutro Abs: 7420 cells/uL (ref 1500–7800)
Neutrophils Relative %: 68.7 %
Platelets: 433 10*3/uL — ABNORMAL HIGH (ref 140–400)
RBC: 3.78 10*6/uL — ABNORMAL LOW (ref 3.80–5.10)
RDW: 12.2 % (ref 11.0–15.0)
Total Lymphocyte: 23.1 %
WBC: 10.8 10*3/uL (ref 3.8–10.8)

## 2019-03-28 LAB — BASIC METABOLIC PANEL
BUN/Creatinine Ratio: 13 (calc) (ref 6–22)
BUN: 14 mg/dL (ref 7–25)
CO2: 25 mmol/L (ref 20–32)
Calcium: 8.8 mg/dL (ref 8.6–10.4)
Chloride: 104 mmol/L (ref 98–110)
Creat: 1.06 mg/dL — ABNORMAL HIGH (ref 0.50–1.05)
Glucose, Bld: 206 mg/dL — ABNORMAL HIGH (ref 65–99)
Potassium: 4.5 mmol/L (ref 3.5–5.3)
Sodium: 138 mmol/L (ref 135–146)

## 2019-03-28 LAB — HIV-1 RNA QUANT-NO REFLEX-BLD
HIV 1 RNA Quant: 21 copies/mL — ABNORMAL HIGH
HIV-1 RNA Quant, Log: 1.32 Log copies/mL — ABNORMAL HIGH

## 2019-03-31 ENCOUNTER — Encounter (INDEPENDENT_AMBULATORY_CARE_PROVIDER_SITE_OTHER): Payer: Self-pay | Admitting: Orthopedic Surgery

## 2019-03-31 ENCOUNTER — Ambulatory Visit (INDEPENDENT_AMBULATORY_CARE_PROVIDER_SITE_OTHER): Payer: Medicare Other | Admitting: Orthopedic Surgery

## 2019-03-31 ENCOUNTER — Other Ambulatory Visit: Payer: Self-pay

## 2019-03-31 ENCOUNTER — Ambulatory Visit (INDEPENDENT_AMBULATORY_CARE_PROVIDER_SITE_OTHER): Payer: Self-pay

## 2019-03-31 VITALS — Ht 69.0 in | Wt 267.0 lb

## 2019-03-31 DIAGNOSIS — L97522 Non-pressure chronic ulcer of other part of left foot with fat layer exposed: Secondary | ICD-10-CM

## 2019-03-31 DIAGNOSIS — T847XXS Infection and inflammatory reaction due to other internal orthopedic prosthetic devices, implants and grafts, sequela: Secondary | ICD-10-CM | POA: Diagnosis not present

## 2019-03-31 NOTE — Progress Notes (Signed)
Office Visit Note   Patient: Katrina Wolfe           Date of Birth: 04-02-1966           MRN: [id number] Visit Date: 03/31/2019              Requested by: Wenda Low, MD [address] [place], [place] [address] PCP: Wenda Low, MD  Chief Complaint  Patient presents with  . Left Foot - Follow-up    Ulcer pt in [facility]      HPI: Patient is a 53 year old woman who presents in follow-up for a large ulcer of the lateral aspect of her left foot.  She is about 2 years out from open reduction internal fixation of the fifth metatarsal.  Patient has been treated with offloading most recently she was placed in a cast to offload the lateral aspect of her foot.  Patient presents with increased drainage maceration over the wound.  Assessment & Plan: Visit Diagnoses:  1. Ulcer of left foot, with fat layer exposed ([place])   2. Hardware complicating wound infection, sequela     Plan: Due to the nature of the wound the retained hardware and bony changes will need to plan for partial excision of the fifth metatarsal removal of the deep retained hardware obtain deep cultures patient may require 6 weeks of IV antibiotics through a PICC line may consider placing antibiotic beads.  Will obtain deep cultures at the time of surgery will hold on antibiotics to ensure we get good cultures from surgery.  Follow-Up Instructions: Return in about 1 week (around 04/07/2019).   Ortho Exam  Patient is alert, oriented, no adenopathy, well-dressed, normal affect, normal respiratory effort. Examination patient has good triphasic dorsalis pedis and posterior tibial pulse.  She has a large hyper granulating wound over the fifth metatarsal.  The ulcer probes almost all the way down to the hardware.  Ulcer is 4 cm in diameter with proud flesh.  Imaging: Xr Foot Complete Left  Result Date: 03/31/2019 3 view radiographs of the left foot show some lucency around the hardware but no significant  destructive lytic changes no hardware failure.    Labs: No results found for: HGBA1C, ESRSEDRATE, CRP, LABURIC, REPTSTATUS, GRAMSTAIN, CULT, LABORGA   Lab Results  Component Value Date   ALBUMIN 3.9 03/01/2019   ALBUMIN 3.5 (L) 05/24/2017   ALBUMIN 4.2 11/23/2016    Body mass index is 39.43 kg/m.  Orders:  Orders Placed This Encounter  Procedures  . XR Foot Complete Left   No orders of the defined types were placed in this encounter.    Procedures: No procedures performed  Clinical Data: No additional findings.  ROS:  All other systems negative, except as noted in the HPI. Review of Systems  Objective: Vital Signs: Ht 5\' 9"  (1.753 m)   Wt 267 lb (121.1 kg)   LMP 10/22/2018   BMI 39.43 kg/m   Specialty Comments:  No specialty comments available.  PMFS History: Patient Active Problem List   Diagnosis Date Noted  . HIV disease ([place]) 10/27/2015  . DM type 2 (diabetes mellitus, type 2) ([place]) 10/27/2015  . Hyperlipidemia 10/27/2015  . Essential hypertension 10/27/2015  . Severe vulvar dysplasia, histologically confirmed 10/15/2015   Past Medical History:  Diagnosis Date  . Abnormal uterine bleeding (AUB)   . Arthritis    knees  . Asthma    as child, no problems in 20 yrs  . CKD (chronic kidney disease), stage  II   . Congenital cardiomegaly    per pt has always been told since childhood and siblings   . GERD (gastroesophageal reflux disease)   . History of genital warts   . HIV (human immunodeficiency virus infection) ([place])    [place]-- DR Carlyle Basques  . Hypertension   . Intermittent palpitations    mild-- no meds  . Legally blind in left eye, as defined in [place]    trauma as child  . Type 2 diabetes mellitus ([place])   . Uterine fibroid   . VIN III (vulvar intraepithelial neoplasia III)    and Verrucoid lesion on the mons  . Wears glasses     Family History  Problem Relation Age of Onset  . Hypertension  Mother   . Diabetes Father   . Cancer Brother   . Breast cancer Cousin     Past Surgical History:  Procedure Laterality Date  . CESAREAN SECTION  2001  &  1984  . CO2 LASER APPLICATION N/A 17/03/813   Procedure: CO2 LASER APPLICATION AND WIDE VOCAL EXCSION OF LESION ON MONS PUBIS;  Surgeon: Marti Sleigh, MD;  Location: [facility];  Service: Gynecology;  Laterality: N/A;   CASE CANCELLED   . CO2 LASER APPLICATION N/A 4/81/8563   Procedure: CO2 LASER OF VULVAR;  Surgeon: Marti Sleigh, MD;  Location: [facility];  Service: Gynecology;  Laterality: N/A;  . CORNEAL TRANSPLANT Left 2006   failed  . D & C HYSTEROSCOPY /  RESECTION FIBROID  2004  . KNEE ARTHROSCOPY W/ ACL RECONSTRUCTION Bilateral 2008  . ORIF TOE FRACTURE Left 08/02/2017   Procedure: OPEN REDUCTION INTERNAL FIXATION (ORIF) FIFTH METATARSAL (TOE) BASE FRACTURE;  Surgeon: Wylene Simmer, MD;  Location: [facility];  Service: Orthopedics;  Laterality: Left;  Marland Kitchen VULVECTOMY N/A 01/07/2016   Procedure: WIDE LOCAL EXCISION;  Surgeon: Marti Sleigh, MD;  Location: [facility];  Service: Gynecology;  Laterality: N/A;  mons pubis as site   Social History   Occupational History  . Not on file  Tobacco Use  . Smoking status: Never Smoker  . Smokeless tobacco: Never Used  Substance and Sexual Activity  . Alcohol use: No    Alcohol/week: 0.0 standard drinks  . Drug use: No  . Sexual activity: Yes    Partners: Male    Birth control/protection: None, Condom    Comment: declined condoms - has at home

## 2019-04-01 ENCOUNTER — Ambulatory Visit (INDEPENDENT_AMBULATORY_CARE_PROVIDER_SITE_OTHER): Payer: Self-pay | Admitting: Physician Assistant

## 2019-04-02 ENCOUNTER — Telehealth: Payer: Self-pay | Admitting: Family

## 2019-04-02 NOTE — Progress Notes (Signed)
Subjective:    Patient ID: Katrina Wolfe, female    DOB: [birth year], 53 y.o.   MRN: [id number]  Chief Complaint  Patient presents with  . Follow-up     HPI:  Katrina Wolfe is a 53 y.o. female with HIV disease last seen in the office on 11/18/2018 with good adherence and tolerance to her ART regimen of Biktarvy.  Previous viral load was undetectable with CD4 count of 540.  Most recent blood work completed on 03/20/2019 with continued viral load suppression and CD4 count of 590.  Renal function, electrolytes, and white/red blood cells within normal ranges.  RPR was nonreactive.  Healthcare maintenance due includes colon cancer screening.  Katrina Wolfe has been taking her Biktarvy as prescribed with no adverse side effects or missed doses.  Feeling well today.  She does have left foot pain and is scheduled for surgery tomorrow to have hardware removed secondary to infection by Dr. Sharol Given  Denies fevers, chills, night sweats, headaches, changes in vision, neck pain/stiffness, nausea, diarrhea, vomiting, lesions or rashes.   Katrina Wolfe remains covered through [place] healthcare Medicare and has no problems obtaining her medication.  Denies feelings of being down, depressed, or hopeless over the last 2 weeks.  Not currently sexually active and declines condoms.  She has not had a colonoscopy and would like to defer to primary care.   Allergies  Allergen Reactions  . Shellfish Allergy Anaphylaxis    All types shellfish  . Bactrim [Sulfamethoxazole-Trimethoprim] Hives      Outpatient Medications Prior to Visit  Medication Sig Dispense Refill  . aspirin EC 81 MG tablet Take 1 tablet (81 mg total) by mouth 2 (two) times daily. 84 tablet 0  . aspirin-acetaminophen-caffeine (EXCEDRIN MIGRAINE) 250-250-65 MG tablet Take 2 tablets by mouth daily as needed for headache or migraine.    Marland Kitchen atorvastatin (LIPITOR) 10 MG tablet Take 10 mg by mouth every morning.     Marland Kitchen atropine 1 % ophthalmic solution Place 1  drop into both eyes 2 (two) times daily.    . bictegravir-emtricitabine-tenofovir AF (BIKTARVY) 50-200-25 MG TABS tablet Take 1 tablet by mouth daily. 30 tablet 11  . calcium carbonate (TUMS - DOSED IN MG ELEMENTAL CALCIUM) 500 MG chewable tablet Chew 1 tablet by mouth as needed for indigestion or heartburn.    . cloNIDine (CATAPRES) 0.3 MG tablet Take 0.3 mg by mouth 2 (two) times daily.     Marland Kitchen gabapentin (NEURONTIN) 100 MG capsule Take 100 mg by mouth 3 (three) times daily.    Marland Kitchen glimepiride (AMARYL) 2 MG tablet Take 2 mg by mouth daily.    Marland Kitchen HUMALOG MIX 75/25 KWIKPEN (75-25) 100 UNIT/ML Kwikpen Inject 56 Units into the skin 2 (two) times daily.     Marland Kitchen HYDROcodone-acetaminophen (NORCO/VICODIN) 5-325 MG tablet Take 1 tablet by mouth every 6 (six) hours as needed for moderate pain. 30 tablet 0  . Insulin Syringe-Needle U-100 (INSULIN SYRINGE 1CC/31GX5/16") 31G X 5/16" 1 ML MISC 1 Units by Does not apply route 2 (two) times daily. 30 each 0  . Lancets (ONETOUCH ULTRASOFT) lancets     . lisinopril (PRINIVIL,ZESTRIL) 10 MG tablet Take 1 tablet (10 mg total) by mouth daily. 30 tablet 0  . lisinopril-hydrochlorothiazide (PRINZIDE,ZESTORETIC) 20-25 MG tablet Take 1 tablet by mouth daily.    . metFORMIN (GLUCOPHAGE) 1000 MG tablet Take 1,000 mg by mouth daily with breakfast.     . ONE TOUCH ULTRA TEST test strip     . VICTOZA  18 MG/3ML SOPN Inject 1.8 mg into the skin every morning.      No facility-administered medications prior to visit.      Past Medical History:  Diagnosis Date  . Abnormal uterine bleeding (AUB)   . Arthritis    knees, fingers  . Asthma    as child, no problems in 20 yrs  . Cancer ([place])   . CKD (chronic kidney disease), stage II   . Congenital cardiomegaly    per pt has always been told since childhood and siblings   . GERD (gastroesophageal reflux disease)   . History of genital warts   . HIV (human immunodeficiency virus infection) ([place])    [place]-- DR Carlyle Basques  . Hypertension   . Intermittent palpitations    mild-- no meds  . Legally blind in left eye, as defined in [place]    trauma as child  . Tuberculosis    ? 2005 or 2007  . Type 2 diabetes mellitus ([facility])    Type II   . Uterine fibroid   . VIN III (vulvar intraepithelial neoplasia III)    and Verrucoid lesion on the mons  . Wears glasses      Past Surgical History:  Procedure Laterality Date  . CESAREAN SECTION  2001  &  1984  . CO2 LASER APPLICATION N/A 70/12/7791   Procedure: CO2 LASER APPLICATION AND WIDE VOCAL EXCSION OF LESION ON MONS PUBIS;  Surgeon: Marti Sleigh, MD;  Location: [facility];  Service: Gynecology;  Laterality: N/A;   CASE CANCELLED   . CO2 LASER APPLICATION N/A 08/21/91   Procedure: CO2 LASER OF VULVAR;  Surgeon: Marti Sleigh, MD;  Location[facility];  Service: Gynecology;  Laterality: N/A;  . CORNEAL TRANSPLANT Left 2006   failed  . D & C HYSTEROSCOPY /  RESECTION FIBROID  2004  . KNEE ARTHROSCOPY W/ ACL RECONSTRUCTION Bilateral 2008  . ORIF TOE FRACTURE Left 08/02/2017   Procedure: OPEN REDUCTION INTERNAL FIXATION (ORIF) FIFTH METATARSAL (TOE) BASE FRACTURE;  Surgeon: Wylene Simmer, MD;  Location: [facility];  Service: Orthopedics;  Laterality: Left;  Marland Kitchen VULVECTOMY N/A 01/07/2016   Procedure: WIDE LOCAL EXCISION;  Surgeon: Marti Sleigh, MD;  Location: [facility];  Service: Gynecology;  Laterality: N/A;  mons pubis as site     Review of Systems  Constitutional: Negative for appetite change, chills, diaphoresis, fatigue, fever and unexpected weight change.  Eyes:       Negative for acute change in vision  Respiratory: Negative for chest tightness, shortness of breath and wheezing.   Cardiovascular: Negative for chest pain.  Gastrointestinal: Negative for diarrhea, nausea and vomiting.  Genitourinary: Negative for dysuria, pelvic pain  and vaginal discharge.  Musculoskeletal: Negative for neck pain and neck stiffness.  Skin: Negative for rash.  Neurological: Negative for seizures, syncope, weakness and headaches.  Hematological: Negative for adenopathy. Does not bruise/bleed easily.  Psychiatric/Behavioral: Negative for hallucinations.      Objective:    BP (!) 166/83   Pulse 80   Temp (!) 97.4 F (36.3 C) (Oral)   Wt 235 lb (106.6 kg)   LMP 02/20/2019   BMI 34.70 kg/m  Nursing note and vital signs reviewed.  Physical Exam Constitutional:      General: She is not in acute distress.    Appearance: She is well-developed.  Eyes:     Conjunctiva/sclera: Conjunctivae normal.  Neck:  Musculoskeletal: Neck supple.  Cardiovascular:     Rate and Rhythm: Normal rate and regular rhythm.     Heart sounds: Normal heart sounds. No murmur. No friction rub. No gallop.   Pulmonary:     Effort: Pulmonary effort is normal. No respiratory distress.     Breath sounds: Normal breath sounds. No wheezing or rales.  Chest:     Chest wall: No tenderness.  Abdominal:     General: Bowel sounds are normal.     Palpations: Abdomen is soft.     Tenderness: There is no abdominal tenderness.  Musculoskeletal:     Comments: Cam walker noted on left foot  Lymphadenopathy:     Cervical: No cervical adenopathy.  Skin:    General: Skin is warm and dry.     Findings: No rash.  Neurological:     Mental Status: She is alert and oriented to person, place, and time.  Psychiatric:        Behavior: Behavior normal.        Thought Content: Thought content normal.        Judgment: Judgment normal.        Assessment & Plan:   Problem List Items Addressed This Visit      Cardiovascular and Mediastinum   Essential hypertension    Blood pressure elevated today.  Continue current medications with changes per PCP.        Other   HIV disease ([place]) - Primary    Ms. Trigg has well-controlled HIV disease with good adherence and  tolerance to her ART regimen of Biktarvy.  She has no signs/symptoms of opportunistic infection or progressive HIV disease at present.  She has no problems obtaining her medications.  Continue current dose of Biktarvy.  Plan for follow-up in 4 months or sooner if needed with lab work 1 to 2 weeks prior to appointment.      Relevant Orders   HIV-1 RNA quant-no reflex-bld   T-helper cell (CD4)- ([facility] clinic only)   Comprehensive metabolic panel   Healthcare maintenance     Due for second dose of Menveo.  Declined today due to upcoming surgery.  Discussed importance of safe sexual practice to reduce risk of acquisition and transmission of STI.         I am having Delainee Hammonds maintain her lisinopril, atorvastatin, cloNIDine, metFORMIN, HumaLOG Mix 75/25 KwikPen, calcium carbonate, INSULIN SYRINGE 1CC/31GX5/16", ONE TOUCH ULTRA TEST, onetouch ultrasoft, aspirin EC, Victoza, gabapentin, glimepiride, bictegravir-emtricitabine-tenofovir AF, aspirin-acetaminophen-caffeine, HYDROcodone-acetaminophen, lisinopril-hydrochlorothiazide, and atropine.   Follow-up: Return in about 4 months (around 08/03/2019), or if symptoms worsen or fail to improve.   Terri Piedra, MSN, FNP-C Nurse Practitioner [facility] [place] number: [phone]

## 2019-04-02 NOTE — Telephone Encounter (Signed)
COVID-19 Pre-Screening Questions: ° °Do you currently have a fever (>100 °F), chills or unexplained body aches? no  ° °Are you currently experiencing new cough, shortness of breath, sore throat, runny nose?no  °•  °Have you recently travelled outside the state of [place] in the last 14 days? no °•  °Have you been in contact with someone that is currently pending confirmation of Covid19 testing or has been confirmed to have the Covid19 virus? no °

## 2019-04-03 ENCOUNTER — Ambulatory Visit (INDEPENDENT_AMBULATORY_CARE_PROVIDER_SITE_OTHER): Payer: Medicare Other | Admitting: Family

## 2019-04-03 ENCOUNTER — Other Ambulatory Visit: Payer: Self-pay

## 2019-04-03 ENCOUNTER — Encounter: Payer: Self-pay | Admitting: Family

## 2019-04-03 ENCOUNTER — Ambulatory Visit (INDEPENDENT_AMBULATORY_CARE_PROVIDER_SITE_OTHER): Payer: Self-pay | Admitting: Physician Assistant

## 2019-04-03 ENCOUNTER — Encounter (HOSPITAL_COMMUNITY): Payer: Self-pay | Admitting: *Deleted

## 2019-04-03 VITALS — BP 166/83 | HR 80 | Temp 97.4°F | Wt 235.0 lb

## 2019-04-03 DIAGNOSIS — B20 Human immunodeficiency virus [HIV] disease: Secondary | ICD-10-CM

## 2019-04-03 DIAGNOSIS — Z0000 Encounter for general adult medical examination without abnormal findings: Secondary | ICD-10-CM | POA: Diagnosis not present

## 2019-04-03 DIAGNOSIS — I10 Essential (primary) hypertension: Secondary | ICD-10-CM

## 2019-04-03 NOTE — Assessment & Plan Note (Signed)
   Due for second dose of Menveo.  Declined today due to upcoming surgery.  Discussed importance of safe sexual practice to reduce risk of acquisition and transmission of STI.

## 2019-04-03 NOTE — Assessment & Plan Note (Signed)
Katrina Wolfe has well-controlled HIV disease with good adherence and tolerance to her ART regimen of Biktarvy.  She has no signs/symptoms of opportunistic infection or progressive HIV disease at present.  She has no problems obtaining her medications.  Continue current dose of Biktarvy.  Plan for follow-up in 4 months or sooner if needed with lab work 1 to 2 weeks prior to appointment.

## 2019-04-03 NOTE — Patient Instructions (Signed)
Nice to see you.  Please continue to take your [place] as prescribed daily.  Best wishes with your surgery tomorrow.   Plan for follow up in 4 months or sooner if needed with lab work 1-2 weeks prior to your appointment with Dr. Baxter Flattery.

## 2019-04-03 NOTE — Assessment & Plan Note (Signed)
Blood pressure elevated today.  Continue current medications with changes per PCP.

## 2019-04-03 NOTE — Progress Notes (Addendum)
Katrina Wolfe denies chest pain or shortness of breath.  PCP is Dr. Lysle Rubens, I will request last office note and labs, EKG if available.  Patient has chronic kidney disease and Dr Lysle Rubens is following.  Katrina Wolfe has type II diabetes, she reports that CBGs run 125- 140.  I instructed patient to take 70% of 70/25 tonight- 38 units and if CBG is greater than 70.  I also instructed her to not take pills for diabetes in am.I instructed patient to check CBG after awaking and every 2 hours until arrival  to the hospital.  I Instructed patient if CBG is less than 70 to drink  1/2 of a clear juice. Recheck CBG in 15 minutes then call pre- op desk at [phone] for further instructions. If scheduled to receive Insulin, do not take Insulin. Patient denies that she or her family has experienced any of the following: Cough Fever >100.4 Runny Nose Sore Throat Difficulty breathing/ shortness of breath Travel in past 14 days- none.

## 2019-04-03 NOTE — Anesthesia Preprocedure Evaluation (Signed)
Anesthesia Evaluation  Patient identified by MRN, date of birth, ID band Patient awake    Reviewed: Allergy & Precautions, NPO status , Patient's Chart, lab work & pertinent test results  Airway Mallampati: II  TM Distance: >3 FB Neck ROM: Full    Dental  (+) Teeth Intact, Dental Advisory Given   Pulmonary asthma ,    Pulmonary exam normal breath sounds clear to auscultation       Cardiovascular hypertension, Pt. on medications Normal cardiovascular exam Rhythm:Regular Rate:Normal  Congenital cardiomegaly   Neuro/Psych Blind in left eye  negative psych ROS   GI/Hepatic GERD  ,  Endo/Other  diabetes, Type 2, Oral Hypoglycemic AgentsMorbid obesity  Renal/GU Renal InsufficiencyRenal disease     Musculoskeletal negative musculoskeletal ROS (+)   Abdominal   Peds  Hematology  (+) HIV, HIV    Anesthesia Other Findings Day of surgery medications reviewed with the patient.  Reproductive/Obstetrics                             Anesthesia Physical  Anesthesia Plan  ASA: III  Anesthesia Plan: General   Post-op Pain Management:    Induction: Intravenous  PONV Risk Score and Plan: 3 and Ondansetron, Dexamethasone and Midazolam  Airway Management Planned: LMA and Oral ETT  Additional Equipment:   Intra-op Plan:   Post-operative Plan: Extubation in OR  Informed Consent: I have reviewed the patients History and Physical, chart, labs and discussed the procedure including the risks, benefits and alternatives for the proposed anesthesia with the patient or authorized representative who has indicated his/her understanding and acceptance.     Dental advisory given  Plan Discussed with: CRNA and Anesthesiologist  Anesthesia Plan Comments:         Anesthesia Quick Evaluation

## 2019-04-04 ENCOUNTER — Inpatient Hospital Stay (HOSPITAL_COMMUNITY)
Admission: RE | Admit: 2019-04-04 | Discharge: 2019-04-08 | DRG: 496 | Disposition: A | Discharge status: Home Health | Payer: Medicare Other | Attending: Orthopedic Surgery | Admitting: Orthopedic Surgery

## 2019-04-04 ENCOUNTER — Encounter (HOSPITAL_COMMUNITY)
Admission: RE | Disposition: A | Discharge status: Home Health | Payer: Self-pay | Source: Home / Self Care | Attending: Orthopedic Surgery

## 2019-04-04 ENCOUNTER — Other Ambulatory Visit: Payer: Self-pay

## 2019-04-04 ENCOUNTER — Inpatient Hospital Stay (HOSPITAL_COMMUNITY): Payer: Medicare Other | Admitting: Anesthesiology

## 2019-04-04 ENCOUNTER — Encounter (HOSPITAL_COMMUNITY): Payer: Self-pay

## 2019-04-04 DIAGNOSIS — S92352B Displaced fracture of fifth metatarsal bone, left foot, initial encounter for open fracture: Secondary | ICD-10-CM

## 2019-04-04 DIAGNOSIS — L97529 Non-pressure chronic ulcer of other part of left foot with unspecified severity: Secondary | ICD-10-CM | POA: Diagnosis present

## 2019-04-04 DIAGNOSIS — Z79899 Other long term (current) drug therapy: Secondary | ICD-10-CM | POA: Diagnosis not present

## 2019-04-04 DIAGNOSIS — Z21 Asymptomatic human immunodeficiency virus [HIV] infection status: Secondary | ICD-10-CM | POA: Diagnosis present

## 2019-04-04 DIAGNOSIS — X58XXXD Exposure to other specified factors, subsequent encounter: Secondary | ICD-10-CM | POA: Diagnosis present

## 2019-04-04 DIAGNOSIS — I129 Hypertensive chronic kidney disease with stage 1 through stage 4 chronic kidney disease, or unspecified chronic kidney disease: Secondary | ICD-10-CM | POA: Diagnosis present

## 2019-04-04 DIAGNOSIS — Z833 Family history of diabetes mellitus: Secondary | ICD-10-CM | POA: Diagnosis not present

## 2019-04-04 DIAGNOSIS — M86272 Subacute osteomyelitis, left ankle and foot: Secondary | ICD-10-CM

## 2019-04-04 DIAGNOSIS — Z7982 Long term (current) use of aspirin: Secondary | ICD-10-CM

## 2019-04-04 DIAGNOSIS — T8469XA Infection and inflammatory reaction due to internal fixation device of other site, initial encounter: Secondary | ICD-10-CM | POA: Diagnosis present

## 2019-04-04 DIAGNOSIS — H548 Legal blindness, as defined in USA: Secondary | ICD-10-CM | POA: Diagnosis present

## 2019-04-04 DIAGNOSIS — T847XXS Infection and inflammatory reaction due to other internal orthopedic prosthetic devices, implants and grafts, sequela: Secondary | ICD-10-CM

## 2019-04-04 DIAGNOSIS — B965 Pseudomonas (aeruginosa) (mallei) (pseudomallei) as the cause of diseases classified elsewhere: Secondary | ICD-10-CM | POA: Diagnosis present

## 2019-04-04 DIAGNOSIS — Y848 Other medical procedures as the cause of abnormal reaction of the patient, or of later complication, without mention of misadventure at the time of the procedure: Secondary | ICD-10-CM | POA: Diagnosis present

## 2019-04-04 DIAGNOSIS — S92352K Displaced fracture of fifth metatarsal bone, left foot, subsequent encounter for fracture with nonunion: Secondary | ICD-10-CM | POA: Diagnosis not present

## 2019-04-04 DIAGNOSIS — N182 Chronic kidney disease, stage 2 (mild): Secondary | ICD-10-CM | POA: Diagnosis present

## 2019-04-04 DIAGNOSIS — M79675 Pain in left toe(s): Secondary | ICD-10-CM | POA: Diagnosis present

## 2019-04-04 DIAGNOSIS — B9561 Methicillin susceptible Staphylococcus aureus infection as the cause of diseases classified elsewhere: Secondary | ICD-10-CM | POA: Diagnosis present

## 2019-04-04 DIAGNOSIS — E1169 Type 2 diabetes mellitus with other specified complication: Secondary | ICD-10-CM | POA: Diagnosis present

## 2019-04-04 DIAGNOSIS — E11621 Type 2 diabetes mellitus with foot ulcer: Secondary | ICD-10-CM | POA: Diagnosis present

## 2019-04-04 DIAGNOSIS — Z794 Long term (current) use of insulin: Secondary | ICD-10-CM | POA: Diagnosis not present

## 2019-04-04 DIAGNOSIS — E1122 Type 2 diabetes mellitus with diabetic chronic kidney disease: Secondary | ICD-10-CM | POA: Diagnosis present

## 2019-04-04 DIAGNOSIS — T847XXA Infection and inflammatory reaction due to other internal orthopedic prosthetic devices, implants and grafts, initial encounter: Secondary | ICD-10-CM

## 2019-04-04 DIAGNOSIS — M869 Osteomyelitis, unspecified: Secondary | ICD-10-CM | POA: Diagnosis present

## 2019-04-04 HISTORY — PX: HARDWARE REMOVAL: SHX979

## 2019-04-04 HISTORY — DX: Respiratory tuberculosis unspecified: A15.9

## 2019-04-04 HISTORY — DX: Malignant (primary) neoplasm, unspecified: C80.1

## 2019-04-04 LAB — BASIC METABOLIC PANEL
Anion gap: 11 (ref 5–15)
BUN: 10 mg/dL (ref 6–20)
CO2: 19 mmol/L — ABNORMAL LOW (ref 22–32)
Calcium: 8.7 mg/dL — ABNORMAL LOW (ref 8.9–10.3)
Chloride: 106 mmol/L (ref 98–111)
Creatinine, Ser: 1.03 mg/dL — ABNORMAL HIGH (ref 0.44–1.00)
GFR calc Af Amer: >60 mL/min (ref >60)
GFR calc non Af Amer: >60 mL/min (ref >60)
Glucose, Bld: 176 mg/dL — ABNORMAL HIGH (ref 70–99)
Potassium: 3.6 mmol/L (ref 3.5–5.1)
Sodium: 136 mmol/L (ref 135–145)

## 2019-04-04 LAB — GLUCOSE, CAPILLARY
Glucose-Capillary: 162 mg/dL — ABNORMAL HIGH (ref 70–99)
Glucose-Capillary: 147 mg/dL — ABNORMAL HIGH (ref 70–99)
Glucose-Capillary: 143 mg/dL — ABNORMAL HIGH (ref 70–99)
Glucose-Capillary: 169 mg/dL — ABNORMAL HIGH (ref 70–99)
Glucose-Capillary: 227 mg/dL — ABNORMAL HIGH (ref 70–99)
Glucose-Capillary: 245 mg/dL — ABNORMAL HIGH (ref 70–99)

## 2019-04-04 LAB — CBC
HCT: 34 % — ABNORMAL LOW (ref 36.0–46.0)
Hemoglobin: 10.6 g/dL — ABNORMAL LOW (ref 12.0–15.0)
MCH: 29.9 pg (ref 26.0–34.0)
MCHC: 31.2 g/dL (ref 30.0–36.0)
MCV: 96 fL (ref 80.0–100.0)
Platelets: 412 10*3/uL — ABNORMAL HIGH (ref 150–400)
RBC: 3.54 MIL/uL — ABNORMAL LOW (ref 3.87–5.11)
RDW: 13.2 % (ref 11.5–15.5)
WBC: 9.7 10*3/uL (ref 4.0–10.5)
nRBC: 0 % (ref 0.0–0.2)

## 2019-04-04 LAB — POCT PREGNANCY, URINE: Preg Test, Ur: NEGATIVE

## 2019-04-04 SURGERY — REMOVAL, HARDWARE
Anesthesia: General | Site: Foot | Laterality: Left

## 2019-04-04 MED ORDER — MORPHINE SULFATE (PF) 2 MG/ML IV SOLN: 0.5000 mg | INTRAVENOUS | Status: DC | PRN

## 2019-04-04 MED ORDER — DEXAMETHASONE SODIUM PHOSPHATE 10 MG/ML IJ SOLN
INTRAMUSCULAR | Status: AC
  Filled 2019-04-04: qty 1

## 2019-04-04 MED ORDER — ASPIRIN EC 81 MG PO TBEC
81.0000 mg | DELAYED_RELEASE_TABLET | Freq: Two times a day (BID) | ORAL | Status: DC
  Administered 2019-04-04 – 2019-04-08 (×9): 81 mg via ORAL
  Filled 2019-04-04 (×9): qty 1

## 2019-04-04 MED ORDER — ATROPINE SULFATE 1 % OP SOLN
1.0000 [drp] | Freq: Two times a day (BID) | OPHTHALMIC | Status: DC
  Administered 2019-04-04 – 2019-04-08 (×9): 1 [drp] via OPHTHALMIC
  Filled 2019-04-04 (×3): qty 2

## 2019-04-04 MED ORDER — METFORMIN HCL ER 500 MG PO TB24
1000.0000 mg | ORAL_TABLET | Freq: Every day | ORAL | Status: DC
  Administered 2019-04-05 – 2019-04-08 (×4): 1000 mg via ORAL
  Filled 2019-04-04 (×4): qty 2

## 2019-04-04 MED ORDER — ATORVASTATIN CALCIUM 10 MG PO TABS
10.0000 mg | ORAL_TABLET | Freq: Every morning | ORAL | Status: DC
  Administered 2019-04-04 – 2019-04-08 (×5): 10 mg via ORAL
  Filled 2019-04-04 (×5): qty 1

## 2019-04-04 MED ORDER — CEFAZOLIN SODIUM-DEXTROSE 2-4 GM/100ML-% IV SOLN
2.0000 g | INTRAVENOUS | Status: AC
  Administered 2019-04-04: 1 g via INTRAVENOUS
  Administered 2019-04-04: 2 g via INTRAVENOUS

## 2019-04-04 MED ORDER — INSULIN ASPART 100 UNIT/ML ~~LOC~~ SOLN
0.0000 [IU] | Freq: Three times a day (TID) | SUBCUTANEOUS | Status: DC
  Administered 2019-04-04: 2 [IU] via SUBCUTANEOUS
  Administered 2019-04-04: 3 [IU] via SUBCUTANEOUS
  Administered 2019-04-05 (×2): 2 [IU] via SUBCUTANEOUS
  Administered 2019-04-05: 1 [IU] via SUBCUTANEOUS
  Administered 2019-04-06 (×2): 2 [IU] via SUBCUTANEOUS
  Administered 2019-04-07: 1 [IU] via SUBCUTANEOUS
  Administered 2019-04-07: 2 [IU] via SUBCUTANEOUS

## 2019-04-04 MED ORDER — LIRAGLUTIDE 18 MG/3ML ~~LOC~~ SOPN: 1.8000 mg | PEN_INJECTOR | SUBCUTANEOUS | Status: DC

## 2019-04-04 MED ORDER — DOCUSATE SODIUM 100 MG PO CAPS
100.0000 mg | ORAL_CAPSULE | Freq: Two times a day (BID) | ORAL | Status: DC
  Administered 2019-04-04 – 2019-04-08 (×9): 100 mg via ORAL
  Filled 2019-04-04 (×9): qty 1

## 2019-04-04 MED ORDER — LIDOCAINE 2% (20 MG/ML) 5 ML SYRINGE
INTRAMUSCULAR | Status: DC | PRN
  Administered 2019-04-04: 100 mg via INTRAVENOUS

## 2019-04-04 MED ORDER — FENTANYL CITRATE (PF) 100 MCG/2ML IJ SOLN
INTRAMUSCULAR | Status: DC | PRN
  Administered 2019-04-04: 100 ug via INTRAVENOUS

## 2019-04-04 MED ORDER — CEFAZOLIN SODIUM 1 G IJ SOLR
INTRAMUSCULAR | Status: AC
  Filled 2019-04-04: qty 10

## 2019-04-04 MED ORDER — SUCCINYLCHOLINE CHLORIDE 200 MG/10ML IV SOSY
PREFILLED_SYRINGE | INTRAVENOUS | Status: AC
  Filled 2019-04-04: qty 10

## 2019-04-04 MED ORDER — 0.9 % SODIUM CHLORIDE (POUR BTL) OPTIME
TOPICAL | Status: DC | PRN
  Administered 2019-04-04: 1000 mL

## 2019-04-04 MED ORDER — DEXAMETHASONE SODIUM PHOSPHATE 10 MG/ML IJ SOLN
INTRAMUSCULAR | Status: DC | PRN
  Administered 2019-04-04: 4 mg via INTRAVENOUS

## 2019-04-04 MED ORDER — FENTANYL CITRATE (PF) 250 MCG/5ML IJ SOLN
INTRAMUSCULAR | Status: AC
  Filled 2019-04-04: qty 5

## 2019-04-04 MED ORDER — OXYCODONE HCL 5 MG PO TABS: 5.0000 mg | ORAL_TABLET | Freq: Once | ORAL | Status: DC | PRN

## 2019-04-04 MED ORDER — MEPERIDINE HCL 50 MG/ML IJ SOLN: 6.2500 mg | INTRAMUSCULAR | Status: DC | PRN

## 2019-04-04 MED ORDER — GLIMEPIRIDE 4 MG PO TABS
2.0000 mg | ORAL_TABLET | Freq: Every day | ORAL | Status: DC
  Administered 2019-04-05 – 2019-04-08 (×4): 2 mg via ORAL
  Filled 2019-04-04 (×4): qty 1

## 2019-04-04 MED ORDER — METOCLOPRAMIDE HCL 5 MG PO TABS: 5.0000 mg | ORAL_TABLET | Freq: Three times a day (TID) | ORAL | Status: DC | PRN

## 2019-04-04 MED ORDER — LISINOPRIL-HYDROCHLOROTHIAZIDE 20-25 MG PO TABS: 1.0000 | ORAL_TABLET | Freq: Every day | ORAL | Status: DC

## 2019-04-04 MED ORDER — ACETAMINOPHEN 325 MG PO TABS: 325.0000 mg | ORAL_TABLET | ORAL | Status: DC | PRN

## 2019-04-04 MED ORDER — PROPOFOL 10 MG/ML IV BOLUS
INTRAVENOUS | Status: DC | PRN
  Administered 2019-04-04: 30 mg via INTRAVENOUS
  Administered 2019-04-04: 150 mg via INTRAVENOUS

## 2019-04-04 MED ORDER — FENTANYL CITRATE (PF) 100 MCG/2ML IJ SOLN: 25.0000 ug | INTRAMUSCULAR | Status: DC | PRN

## 2019-04-04 MED ORDER — ONDANSETRON HCL 4 MG/2ML IJ SOLN
INTRAMUSCULAR | Status: AC
  Filled 2019-04-04: qty 2

## 2019-04-04 MED ORDER — LIDOCAINE 2% (20 MG/ML) 5 ML SYRINGE
INTRAMUSCULAR | Status: AC
  Filled 2019-04-04: qty 5

## 2019-04-04 MED ORDER — VANCOMYCIN HCL 500 MG IV SOLR
INTRAVENOUS | Status: DC | PRN
  Administered 2019-04-04: 500 mg

## 2019-04-04 MED ORDER — VANCOMYCIN HCL 10 G IV SOLR
2000.0000 mg | Freq: Once | INTRAVENOUS | Status: AC
  Administered 2019-04-04: 13:00:00 2000 mg via INTRAVENOUS
  Filled 2019-04-04: qty 2000

## 2019-04-04 MED ORDER — GABAPENTIN 100 MG PO CAPS
100.0000 mg | ORAL_CAPSULE | Freq: Three times a day (TID) | ORAL | Status: DC
  Administered 2019-04-04 – 2019-04-08 (×13): 100 mg via ORAL
  Filled 2019-04-04 (×13): qty 1

## 2019-04-04 MED ORDER — ONDANSETRON HCL 4 MG PO TABS: 4.0000 mg | ORAL_TABLET | Freq: Four times a day (QID) | ORAL | Status: DC | PRN

## 2019-04-04 MED ORDER — ONDANSETRON HCL 4 MG/2ML IJ SOLN: 4.0000 mg | Freq: Once | INTRAMUSCULAR | Status: DC | PRN

## 2019-04-04 MED ORDER — INSULIN ASPART PROT & ASPART (70-30 MIX) 100 UNIT/ML ~~LOC~~ SUSP
40.0000 [IU] | Freq: Two times a day (BID) | SUBCUTANEOUS | Status: DC
  Administered 2019-04-04 – 2019-04-08 (×8): 40 [IU] via SUBCUTANEOUS
  Filled 2019-04-04: qty 10

## 2019-04-04 MED ORDER — ACETAMINOPHEN 500 MG PO TABS
500.0000 mg | ORAL_TABLET | Freq: Four times a day (QID) | ORAL | Status: AC
  Administered 2019-04-04 (×3): 500 mg via ORAL
  Filled 2019-04-04 (×4): qty 1

## 2019-04-04 MED ORDER — CHLORHEXIDINE GLUCONATE 4 % EX LIQD: 60.0000 mL | Freq: Once | CUTANEOUS | Status: DC

## 2019-04-04 MED ORDER — HYDROCODONE-ACETAMINOPHEN 7.5-325 MG PO TABS
1.0000 | ORAL_TABLET | ORAL | Status: DC | PRN
  Administered 2019-04-04 (×2): 2 via ORAL
  Administered 2019-04-05 – 2019-04-06 (×2): 1 via ORAL
  Administered 2019-04-06: 2 via ORAL
  Administered 2019-04-06 – 2019-04-07 (×2): 1 via ORAL
  Filled 2019-04-04: qty 2
  Filled 2019-04-04 (×4): qty 1
  Filled 2019-04-04 (×2): qty 2

## 2019-04-04 MED ORDER — ACETAMINOPHEN 160 MG/5ML PO SOLN: 325.0000 mg | ORAL | Status: DC | PRN

## 2019-04-04 MED ORDER — LISINOPRIL 20 MG PO TABS
20.0000 mg | ORAL_TABLET | Freq: Every day | ORAL | Status: DC
  Administered 2019-04-04 – 2019-04-08 (×5): 20 mg via ORAL
  Filled 2019-04-04 (×5): qty 1

## 2019-04-04 MED ORDER — GENTAMICIN SULFATE 40 MG/ML IJ SOLN
INTRAMUSCULAR | Status: DC | PRN
  Administered 2019-04-04 (×2): 80 mg

## 2019-04-04 MED ORDER — VANCOMYCIN HCL 10 G IV SOLR
1500.0000 mg | Freq: Two times a day (BID) | INTRAVENOUS | Status: DC
  Administered 2019-04-05 – 2019-04-07 (×5): 1500 mg via INTRAVENOUS
  Filled 2019-04-04 (×8): qty 1500

## 2019-04-04 MED ORDER — CALCIUM CARBONATE ANTACID 500 MG PO CHEW: 1.0000 | CHEWABLE_TABLET | ORAL | Status: DC | PRN

## 2019-04-04 MED ORDER — PROPOFOL 10 MG/ML IV BOLUS
INTRAVENOUS | Status: AC
  Filled 2019-04-04: qty 20

## 2019-04-04 MED ORDER — METOCLOPRAMIDE HCL 5 MG/ML IJ SOLN: 5.0000 mg | Freq: Three times a day (TID) | INTRAMUSCULAR | Status: DC | PRN

## 2019-04-04 MED ORDER — MIDAZOLAM HCL 5 MG/5ML IJ SOLN
INTRAMUSCULAR | Status: DC | PRN
  Administered 2019-04-04: 2 mg via INTRAVENOUS

## 2019-04-04 MED ORDER — ONDANSETRON HCL 4 MG/2ML IJ SOLN
INTRAMUSCULAR | Status: DC | PRN
  Administered 2019-04-04: 4 mg via INTRAVENOUS

## 2019-04-04 MED ORDER — GENTAMICIN SULFATE 40 MG/ML IJ SOLN
INTRAMUSCULAR | Status: AC
  Filled 2019-04-04: qty 2

## 2019-04-04 MED ORDER — INSULIN ASPART PROT & ASPART (70-30 MIX) 100 UNIT/ML ~~LOC~~ SUSP
40.0000 [IU] | Freq: Two times a day (BID) | SUBCUTANEOUS | Status: DC
  Filled 2019-04-04: qty 10

## 2019-04-04 MED ORDER — MIDAZOLAM HCL 2 MG/2ML IJ SOLN
INTRAMUSCULAR | Status: AC
  Filled 2019-04-04: qty 2

## 2019-04-04 MED ORDER — ONDANSETRON HCL 4 MG/2ML IJ SOLN: 4.0000 mg | Freq: Four times a day (QID) | INTRAMUSCULAR | Status: DC | PRN

## 2019-04-04 MED ORDER — SUCCINYLCHOLINE CHLORIDE 200 MG/10ML IV SOSY
PREFILLED_SYRINGE | INTRAVENOUS | Status: DC | PRN
  Administered 2019-04-04: 140 mg via INTRAVENOUS

## 2019-04-04 MED ORDER — ACETAMINOPHEN 325 MG PO TABS
325.0000 mg | ORAL_TABLET | Freq: Four times a day (QID) | ORAL | Status: DC | PRN
  Filled 2019-04-04: qty 2

## 2019-04-04 MED ORDER — LACTATED RINGERS IV SOLN
INTRAVENOUS | Status: DC | PRN
  Administered 2019-04-04: 07:00:00 via INTRAVENOUS

## 2019-04-04 MED ORDER — HYDROCHLOROTHIAZIDE 25 MG PO TABS
25.0000 mg | ORAL_TABLET | Freq: Every day | ORAL | Status: DC
  Administered 2019-04-04 – 2019-04-08 (×5): 25 mg via ORAL
  Filled 2019-04-04 (×5): qty 1

## 2019-04-04 MED ORDER — OXYCODONE HCL 5 MG/5ML PO SOLN: 5.0000 mg | Freq: Once | ORAL | Status: DC | PRN

## 2019-04-04 MED ORDER — BICTEGRAVIR-EMTRICITAB-TENOFOV 50-200-25 MG PO TABS
1.0000 | ORAL_TABLET | Freq: Every day | ORAL | Status: DC
  Administered 2019-04-04 – 2019-04-08 (×5): 1 via ORAL
  Filled 2019-04-04 (×5): qty 1

## 2019-04-04 MED ORDER — CLONIDINE HCL 0.2 MG PO TABS
0.3000 mg | ORAL_TABLET | Freq: Two times a day (BID) | ORAL | Status: DC
  Administered 2019-04-04 – 2019-04-08 (×9): 0.3 mg via ORAL
  Filled 2019-04-04 (×9): qty 1

## 2019-04-04 MED ORDER — HYDROCODONE-ACETAMINOPHEN 5-325 MG PO TABS
1.0000 | ORAL_TABLET | ORAL | Status: DC | PRN
  Administered 2019-04-04: 1 via ORAL
  Administered 2019-04-05 – 2019-04-07 (×3): 2 via ORAL
  Filled 2019-04-04: qty 2
  Filled 2019-04-04: qty 1
  Filled 2019-04-04 (×2): qty 2

## 2019-04-04 MED ORDER — VANCOMYCIN HCL 500 MG IV SOLR
INTRAVENOUS | Status: AC
  Filled 2019-04-04: qty 500

## 2019-04-04 SURGICAL SUPPLY — 57 items
BANDAGE ESMARK 6X9 LF (GAUZE/BANDAGES/DRESSINGS) IMPLANT
BLADE SURG 10 STRL SS (BLADE) ×2 IMPLANT
BNDG COHESIVE 4X5 TAN STRL (GAUZE/BANDAGES/DRESSINGS) ×2 IMPLANT
BNDG ESMARK 6X9 LF (GAUZE/BANDAGES/DRESSINGS)
BNDG GAUZE ELAST 4 BULKY (GAUZE/BANDAGES/DRESSINGS) IMPLANT
CANISTER WOUND CARE 500ML ATS (WOUND CARE) ×2 IMPLANT
CHLORAPREP W/TINT 26 (MISCELLANEOUS) ×2 IMPLANT
CONT SPEC 4OZ CLIKSEAL STRL BL (MISCELLANEOUS) ×2 IMPLANT
COVER SURGICAL LIGHT HANDLE (MISCELLANEOUS) ×6 IMPLANT
COVER WAND RF STERILE (DRAPES) IMPLANT
CUFF TOURNIQUET SINGLE 34IN LL (TOURNIQUET CUFF) IMPLANT
CUFF TOURNIQUET SINGLE 44IN (TOURNIQUET CUFF) IMPLANT
DRAPE C-ARM 42X72 X-RAY (DRAPES) IMPLANT
DRAPE INCISE IOBAN 66X45 STRL (DRAPES) IMPLANT
DRAPE INCISE IOBAN 85X60 (DRAPES) ×2 IMPLANT
DRAPE ORTHO SPLIT 77X108 STRL (DRAPES) ×1
DRAPE SURG ORHT 6 SPLT 77X108 (DRAPES) ×1 IMPLANT
DRAPE U-SHAPE 47X51 STRL (DRAPES) ×2 IMPLANT
DRESSING PREVENA PLUS CUSTOM (GAUZE/BANDAGES/DRESSINGS) ×1 IMPLANT
DRSG EMULSION OIL 3X3 NADH (GAUZE/BANDAGES/DRESSINGS) IMPLANT
DRSG PAD ABDOMINAL 8X10 ST (GAUZE/BANDAGES/DRESSINGS) IMPLANT
DRSG PREVENA PLUS CUSTOM (GAUZE/BANDAGES/DRESSINGS) ×2
DURAPREP 26ML APPLICATOR (WOUND CARE) IMPLANT
ELECT REM PT RETURN 9FT ADLT (ELECTROSURGICAL) ×2
ELECTRODE REM PT RTRN 9FT ADLT (ELECTROSURGICAL) ×1 IMPLANT
GAUZE SPONGE 4X4 12PLY STRL (GAUZE/BANDAGES/DRESSINGS) IMPLANT
GLOVE BIOGEL PI IND STRL 7.0 (GLOVE) ×1 IMPLANT
GLOVE BIOGEL PI IND STRL 9 (GLOVE) ×1 IMPLANT
GLOVE BIOGEL PI INDICATOR 7.0 (GLOVE) ×1
GLOVE BIOGEL PI INDICATOR 9 (GLOVE) ×1
GLOVE SURG ORTHO 9.0 STRL STRW (GLOVE) ×2 IMPLANT
GLOVE SURG SS PI 7.0 STRL IVOR (GLOVE) ×2 IMPLANT
GOWN STRL REUS W/ TWL XL LVL3 (GOWN DISPOSABLE) ×1 IMPLANT
GOWN STRL REUS W/TWL XL LVL3 (GOWN DISPOSABLE) ×1
KIT BASIN OR (CUSTOM PROCEDURE TRAY) ×2 IMPLANT
KIT STIMULAN RAPID CURE 5CC (Orthopedic Implant) ×2 IMPLANT
KIT TURNOVER KIT B (KITS) ×2 IMPLANT
MANIFOLD NEPTUNE II (INSTRUMENTS) IMPLANT
NS IRRIG 1000ML POUR BTL (IV SOLUTION) ×2 IMPLANT
PACK ORTHO EXTREMITY (CUSTOM PROCEDURE TRAY) ×2 IMPLANT
PAD ARMBOARD 7.5X6 YLW CONV (MISCELLANEOUS) ×2 IMPLANT
SPONGE LAP 18X18 RF (DISPOSABLE) IMPLANT
STAPLER VISISTAT 35W (STAPLE) IMPLANT
STOCKINETTE IMPERVIOUS 9X36 MD (GAUZE/BANDAGES/DRESSINGS) IMPLANT
SUT ETHILON 2 0 PSLX (SUTURE) ×4 IMPLANT
SUT VIC AB 0 CT1 27 (SUTURE)
SUT VIC AB 0 CT1 27XBRD ANBCTR (SUTURE) IMPLANT
SUT VIC AB 2-0 CT1 27 (SUTURE)
SUT VIC AB 2-0 CT1 TAPERPNT 27 (SUTURE) IMPLANT
SWAB COLLECTION DEVICE MRSA (MISCELLANEOUS) ×2 IMPLANT
SWAB CULTURE ESWAB REG 1ML (MISCELLANEOUS) ×2 IMPLANT
TOWEL OR 17X24 6PK STRL BLUE (TOWEL DISPOSABLE) IMPLANT
TOWEL OR 17X26 10 PK STRL BLUE (TOWEL DISPOSABLE) ×2 IMPLANT
TUBE CONNECTING 12X1/4 (SUCTIONS) ×2 IMPLANT
UNDERPAD 30X30 (UNDERPADS AND DIAPERS) ×2 IMPLANT
WATER STERILE IRR 1000ML POUR (IV SOLUTION) IMPLANT
YANKAUER SUCT BULB TIP NO VENT (SUCTIONS) ×2 IMPLANT

## 2019-04-04 NOTE — Evaluation (Signed)
Physical Therapy Evaluation Patient Details Name: Kyisha Fowle MRN: [id number] DOB: 1966-07-16 Today's Date: 04/04/2019   History of Present Illness  Patient is a 53 y/o female who presents s/p excision of 5th MT bone and removal of hardware secondary to osteomyelitis. PMH includes DM. HTN, HIV, CKD, ca, blindness left eye.  Clinical Impression  Patient presents with impaired mobility s/p above surgery. Pt Mod I PTA using rollator for ambulation. Today, pt tolerated transfers and SPT to chair with min-Mod A for balance/safety. Pt attempted hopping with RW but unable to without knee buckling. Requires cues to maintain NWB LLE throughout activity. Likely will need w/c at home as main source of mobility. Pt reports she has support from spouse and son at home as well. Will need emergency transport home as pt has 8-9 stairs to climb to get into home and will not be able to hop or butt scoot. Education re: elevation, exercises and ways to get into home safely (butt scoot, hop). If pt does not progress, or does not have necessary help at home, will need rehab. Will follow acutely to maximize independence and mobility prior to return home.     Follow Up Recommendations Home health PT;Supervision for mobility/OOB;Supervision/Assistance - 24 hour    Equipment Recommendations  Wheelchair cushion (measurements PT);Wheelchair (measurements PT);3in1 (PT)    Recommendations for Other Services       Precautions / Restrictions Precautions Precautions: Fall Restrictions Weight Bearing Restrictions: Yes LLE Weight Bearing: Non weight bearing      Mobility  Bed Mobility Overal bed mobility: Modified Independent             General bed mobility comments: Use of rail and increased time but no assist needed.  Transfers Overall transfer level: Needs assistance Equipment used: Rolling walker (2 wheeled) Transfers: Sit to/from [facility] Sit to Stand: Min assist Stand pivot  transfers: Min assist;Mod assist       General transfer comment: Assist to power to standing with cues for NWB LLE, heavy reliance on RW for support. RLe locked into extension. Attempted to take a hop forward but pt with knee buckling noted asnd needed Mod A for balance, right knee stability.  Able to pivot to chair with Min A for RW management/balance.   Ambulation/Gait             General Gait Details: Unable  Stairs            Wheelchair Mobility    Modified Rankin (Stroke Patients Only)       Balance Overall balance assessment: Needs assistance Sitting-balance support: Feet supported;No upper extremity supported Sitting balance-Leahy Scale: Good     Standing balance support: During functional activity Standing balance-Leahy Scale: Poor Standing balance comment: Requires BUE support and external support in standing esp due to NWB LLE.                             Pertinent Vitals/Pain Pain Assessment: No/denies pain    Home Living Family/patient expects to be discharged to:: Private residence Living Arrangements: Spouse/significant other;Children Available Help at Discharge: Family;Available 24 hours/day Type of Home: House Home Access: Stairs to enter Entrance Stairs-Rails: Right Entrance Stairs-Number of Steps: 8-9 Home Layout: One level Home Equipment: Walker - 2 wheels;Shower seat;Walker - 4 wheels;Hand held shower head      Prior Function Level of Independence: Independent         Comments: Family helped transport walker up/down  stairs. Using RW for ambulation. Does the cooking.     Hand Dominance   Dominant Hand: Right    Extremity/Trunk Assessment   Upper Extremity Assessment Upper Extremity Assessment: Defer to OT evaluation    Lower Extremity Assessment Lower Extremity Assessment: LLE deficits/detail LLE Deficits / Details: able to wiggle toes and left LLE off bed without difficultyl  LLE Sensation: decreased light  touch       Communication   Communication: No difficulties  Cognition Arousal/Alertness: Awake/alert Behavior During Therapy: WFL for tasks assessed/performed Overall Cognitive Status: Within Functional Limits for tasks assessed                                        General Comments      Exercises     Assessment/Plan    PT Assessment Patient needs continued PT services  PT Problem List Decreased strength;Decreased balance;Decreased mobility;Impaired sensation;Decreased skin integrity;Decreased knowledge of use of DME;Decreased knowledge of precautions       PT Treatment Interventions DME instruction;Functional mobility training;Balance training;Patient/family education;Gait training;Therapeutic activities;Therapeutic exercise;Wheelchair mobility training;Stair training    PT Goals (Current goals can be found in the Care Plan section)  Acute Rehab PT Goals Patient Stated Goal: to go home PT Goal Formulation: With patient Time For Goal Achievement: 04/18/19 Potential to Achieve Goals: Good    Frequency Min 5X/week   Barriers to discharge Inaccessible home environment stairs to get into home    Co-evaluation               AM-PAC PT "6 Clicks" Mobility  Outcome Measure Help needed turning from your back to your side while in a flat bed without using bedrails?: A Little Help needed moving from lying on your back to sitting on the side of a flat bed without using bedrails?: A Little Help needed moving to and from a bed to a chair (including a wheelchair)?: A Little Help needed standing up from a chair using your arms (e.g., wheelchair or bedside chair)?: A Lot Help needed to walk in hospital room?: A Lot Help needed climbing 3-5 steps with a railing? : Total 6 Click Score: 14    End of Session Equipment Utilized During Treatment: Gait belt Activity Tolerance: Patient tolerated treatment well Patient left: in chair;with call bell/phone within  reach;with chair alarm set Nurse Communication: Mobility status PT Visit Diagnosis: Difficulty in walking, not elsewhere classified (R26.2);Unsteadiness on feet (R26.81)    Time: 1212-1226 PT Time Calculation (min) (ACUTE ONLY): 14 min   Charges:   PT Evaluation $PT Eval Moderate Complexity: 1 Mod          Wray Kearns, PT, DPT Acute Rehabilitation Services Pager [phone] Office [phone]      [place] 04/04/2019, 1:41 PM

## 2019-04-04 NOTE — Transfer of Care (Signed)
Immediate Anesthesia Transfer of Care Note  Patient: Katrina Wolfe  Procedure(s) Performed: EXCISION BONE AND REMOVE DEEP HARDWARE LEFT 5TH METATARSAL (Left Foot)  Patient Location: PACU  Anesthesia Type:General  Level of Consciousness: drowsy and patient cooperative  Airway & Oxygen Therapy: Patient Spontanous Breathing and Patient connected to nasal cannula oxygen  Post-op Assessment: Report given to RN, Post -op Vital signs reviewed and stable and Patient moving all extremities  Post vital signs: Reviewed and stable  Last Vitals:  Vitals Value Taken Time  BP 159/92 04/04/2019  8:26 AM  Temp    Pulse 82 04/04/2019  8:26 AM  Resp 19 04/04/2019  8:26 AM  SpO2 100 % 04/04/2019  8:26 AM  Vitals shown include unvalidated device data.  Last Pain:  Vitals:   04/04/19 0545  TempSrc: Oral  PainSc: 0-No pain      Patients Stated Pain Goal: 1 (09/81/19 1478)  Complications: No apparent anesthesia complications

## 2019-04-04 NOTE — H&P (Signed)
Katrina Wolfe is an 53 y.o. female.   Chief Complaint: Osteomyelitis of left fifth metatarsal with retained hardware HPI: Patient is a 53 year old woman who presents for follow-up of a large ulceration on the lateral aspect of her left foot.  She is 2 years out from open reduction internal fixation of the fifth metatarsal.  She was placed in a full contact cast to offload the lateral aspect of her foot but presents with worsening drainage and maceration over the wound.  Radiographs obtained today show lucency around the hardware but no significant destructive lytic changes with no hardware failure.  We will plan for partial excision of the fifth metatarsal with removal of her deep retained hardware and obtain deep cultures.  Discussed with the patient that she will likely require at least 6 weeks of intravenous antibiotic therapy through a PICC line and possible placement of antibiotic beads.  The patient wishes to proceed with surgery at this time.  Past Medical History:  Diagnosis Date  . Abnormal uterine bleeding (AUB)   . Arthritis    knees, fingers  . Asthma    as child, no problems in 20 yrs  . Cancer ([place])   . CKD (chronic kidney disease), stage II   . Congenital cardiomegaly    per pt has always been told since childhood and siblings   . GERD (gastroesophageal reflux disease)   . History of genital warts   . HIV (human immunodeficiency virus infection) ([place])    [place]-- DR Carlyle Basques  . Hypertension   . Intermittent palpitations    mild-- no meds  . Legally blind in left eye, as defined in [place]    trauma as child  . Tuberculosis    ? 2005 or 2007  . Type 2 diabetes mellitus ([facility])    Type II   . Uterine fibroid   . VIN III (vulvar intraepithelial neoplasia III)    and Verrucoid lesion on the mons  . Wears glasses     Past Surgical History:  Procedure Laterality Date  . CESAREAN SECTION  2001  &  1984  . CO2 LASER APPLICATION N/A  73/01/2024   Procedure: CO2 LASER APPLICATION AND WIDE VOCAL EXCSION OF LESION ON MONS PUBIS;  Surgeon: Marti Sleigh, MD;  Location: [facility];  Service: Gynecology;  Laterality: N/A;   CASE CANCELLED   . CO2 LASER APPLICATION N/A 04/13/622   Procedure: CO2 LASER OF VULVAR;  Surgeon: Marti Sleigh, MD;  Location[facility];  Service: Gynecology;  Laterality: N/A;  . CORNEAL TRANSPLANT Left 2006   failed  . D & C HYSTEROSCOPY /  RESECTION FIBROID  2004  . KNEE ARTHROSCOPY W/ ACL RECONSTRUCTION Bilateral 2008  . ORIF TOE FRACTURE Left 08/02/2017   Procedure: OPEN REDUCTION INTERNAL FIXATION (ORIF) FIFTH METATARSAL (TOE) BASE FRACTURE;  Surgeon: Wylene Simmer, MD;  Location: [facility];  Service: Orthopedics;  Laterality: Left;  Marland Kitchen VULVECTOMY N/A 01/07/2016   Procedure: WIDE LOCAL EXCISION;  Surgeon: Marti Sleigh, MD;  Location: [facility];  Service: Gynecology;  Laterality: N/A;  mons pubis as site    Family History  Problem Relation Age of Onset  . Hypertension Mother   . Diabetes Father   . Cancer Brother   . Breast cancer Cousin    Social History:  reports that she has never smoked. She has never used smokeless tobacco. She reports that she does not drink alcohol or  use drugs.  Allergies:  Allergies  Allergen Reactions  . Shellfish Allergy Anaphylaxis    All types shellfish  . Bactrim [Sulfamethoxazole-Trimethoprim] Hives    Medications Prior to Admission  Medication Sig Dispense Refill  . aspirin EC 81 MG tablet Take 1 tablet (81 mg total) by mouth 2 (two) times daily. 84 tablet 0  . aspirin-acetaminophen-caffeine (EXCEDRIN MIGRAINE) 250-250-65 MG tablet Take 2 tablets by mouth daily as needed for headache or migraine.    Marland Kitchen atorvastatin (LIPITOR) 10 MG tablet Take 10 mg by mouth every morning.     Marland Kitchen atropine 1 % ophthalmic solution Place 1 drop into both eyes 2 (two) times daily.    .  bictegravir-emtricitabine-tenofovir AF (BIKTARVY) 50-200-25 MG TABS tablet Take 1 tablet by mouth daily. 30 tablet 11  . calcium carbonate (TUMS - DOSED IN MG ELEMENTAL CALCIUM) 500 MG chewable tablet Chew 1 tablet by mouth as needed for indigestion or heartburn.    . cloNIDine (CATAPRES) 0.3 MG tablet Take 0.3 mg by mouth 2 (two) times daily.     Marland Kitchen gabapentin (NEURONTIN) 100 MG capsule Take 100 mg by mouth 3 (three) times daily.    Marland Kitchen glimepiride (AMARYL) 2 MG tablet Take 2 mg by mouth daily.    Marland Kitchen HUMALOG MIX 75/25 KWIKPEN (75-25) 100 UNIT/ML Kwikpen Inject 56 Units into the skin 2 (two) times daily.     Marland Kitchen HYDROcodone-acetaminophen (NORCO/VICODIN) 5-325 MG tablet Take 1 tablet by mouth every 6 (six) hours as needed for moderate pain. 30 tablet 0  . lisinopril-hydrochlorothiazide (PRINZIDE,ZESTORETIC) 20-25 MG tablet Take 1 tablet by mouth daily.    . metFORMIN (GLUCOPHAGE) 1000 MG tablet Take 1,000 mg by mouth daily with breakfast.     . VICTOZA 18 MG/3ML SOPN Inject 1.8 mg into the skin every morning.     . Insulin Syringe-Needle U-100 (INSULIN SYRINGE 1CC/31GX5/16") 31G X 5/16" 1 ML MISC 1 Units by Does not apply route 2 (two) times daily. 30 each 0  . Lancets (ONETOUCH ULTRASOFT) lancets     . lisinopril (PRINIVIL,ZESTRIL) 10 MG tablet Take 1 tablet (10 mg total) by mouth daily. 30 tablet 0  . ONE TOUCH ULTRA TEST test strip       Results for orders placed or performed during the hospital encounter of 04/04/19 (from the past 48 hour(s))  Glucose, capillary     Status: Abnormal   Collection Time: 04/04/19  5:43 AM  Result Value Ref Range   Glucose-Capillary 162 (H) 70 - 99 mg/dL  Pregnancy, urine POC     Status: None   Collection Time: 04/04/19  6:24 AM  Result Value Ref Range   Preg Test, Ur NEGATIVE NEGATIVE    Comment:        THE SENSITIVITY OF THIS METHODOLOGY IS >24 mIU/mL    No results found.  Review of Systems  All other systems reviewed and are negative.   Blood pressure  (!) 163/88, pulse 89, temperature 98.2 F (36.8 C), temperature source Oral, resp. rate 18, height 5\' 9"  (1.753 m), weight 120.2 kg, last menstrual period 02/28/2019, SpO2 98 %. Physical Exam  Constitutional: She is oriented to person, place, and time. She appears well-developed and well-nourished. No distress.  HENT:  Head: Normocephalic and atraumatic.  Neck: No tracheal deviation present. No thyromegaly present.  Cardiovascular: Normal rate, regular rhythm and intact distal pulses.  Respiratory: Effort normal. No stridor. No respiratory distress.  GI: Soft. She exhibits no distension.  Musculoskeletal:  Comments: Examination patient has good triphasic dorsalis pedis and posterior tibial pulse.  She has a large hyper granulating wound over the fifth metatarsal.  The ulcer probes almost all the way down to the hardware.  Ulcer is 4 cm in diameter with proud flesh.  Neurological: She is alert and oriented to person, place, and time.  Skin: Skin is warm.  Psychiatric: She has a normal mood and affect. Her behavior is normal. Thought content normal.     Assessment/Plan Osteomyelitis left 5th metatarsal - plan excision of bone and removal of deep hardware- The procedure and possible risks and benefits were discussed with the patient including the risks of bleeding, infection, neurovascular injury and possible need for further surgery and the patient's questions were answered to her satisfaction. She desires to proceed with surgery at this time.   Erlinda Hong, PA-C 04/04/2019, 6:47 AM

## 2019-04-04 NOTE — Progress Notes (Addendum)
Pharmacy Antibiotic Note  Katrina Wolfe is a 53 y.o. female admitted on 04/04/2019 with osteomyelitis.  Pharmacy has been consulted for Vancomycin dosing.  Vancomycin 1500 mg IV Q 12 hrs. Goal AUC 400-550. Expected AUC: 445 SCr used: 1.03  Plan: Vancomycin 2000 mg IV x 1, then Vancomycin 1500 mg IV Q 12 hrs. Monitor renal function, C&S and vanc levels as needed.  Height: 5\' 9"  (175.3 cm) Weight: 265 lb (120.2 kg) IBW/kg (Calculated) : 66.2  Temp (24hrs), Avg:97.6 F (36.4 C), Min:97.3 F (36.3 C), Max:98.2 F (36.8 C)  Recent Labs  Lab 04/04/19 0620  WBC 9.7  CREATININE 1.03*    Estimated Creatinine Clearance: 88.6 mL/min (A) (by C-G formula based on SCr of 1.03 mg/dL (H)).    Allergies  Allergen Reactions  . Shellfish Allergy Anaphylaxis    All types shellfish  . Bactrim [Sulfamethoxazole-Trimethoprim] Hives    Antimicrobials this admission: Vanc 4/17 >>   Thank you for allowing pharmacy to be a part of this patient's care.  Alanda Slim, PharmD, [facility] Clinical Pharmacist Please see AMION for all Pharmacists' Contact Phone Numbers 04/04/2019, 11:54 AM

## 2019-04-04 NOTE — Anesthesia Postprocedure Evaluation (Signed)
Anesthesia Post Note  Patient: Katrina Wolfe  Procedure(s) Performed: EXCISION BONE AND REMOVE DEEP HARDWARE LEFT 5TH METATARSAL (Left Foot)     Anesthesia Post Evaluation  Last Vitals:  Vitals:   04/04/19 0956 04/04/19 1010  BP: (!) 142/86 125/83  Pulse: 76 77  Resp: 16 16  Temp:    SpO2: 100% 99%    Last Pain:  Vitals:   04/04/19 0545  TempSrc: Oral  PainSc: 0-No pain                 Dhruvi Crenshaw

## 2019-04-04 NOTE — Anesthesia Procedure Notes (Signed)
Procedure Name: Intubation Date/Time: 04/04/2019 7:36 AM Performed by: Moshe Salisbury, CRNA Pre-anesthesia Checklist: Patient identified, Emergency Drugs available, Suction available and Patient being monitored Patient Re-evaluated:Patient Re-evaluated prior to induction Oxygen Delivery Method: Circle System Utilized Preoxygenation: Pre-oxygenation with 100% oxygen Induction Type: IV induction and Rapid sequence Laryngoscope Size: Mac and 3 Grade View: Grade II Tube type: Oral Tube size: 7.5 mm Number of attempts: 1 Airway Equipment and Method: Stylet Placement Confirmation: ETT inserted through vocal cords under direct vision,  positive ETCO2 and breath sounds checked- equal and bilateral Secured at: 22 cm Tube secured with: Tape Dental Injury: Teeth and Oropharynx as per pre-operative assessment

## 2019-04-04 NOTE — Op Note (Signed)
04/04/2019  8:25 AM  PATIENT:  Katrina Wolfe    PRE-OPERATIVE DIAGNOSIS:  Osteomyelitis Left 5th Metatarsal with Retained Hardware and nonunion of the fifth metatarsal fracture.  POST-OPERATIVE DIAGNOSIS:  Same  PROCEDURE:  EXCISION BONE OF THE FIFTH METATARSAL, REMOVE DEEP HARDWARE LEFT 5TH METATARSAL, Excision of infected skin and soft tissue and muscle. Local tissue rearrangement for wound closure 4 x 10 cm. Application of wound VAC. Tissue sent for cultures.  SURGEON:  Newt Minion, MD  PHYSICIAN ASSISTANT:None ANESTHESIA:   General  PREOPERATIVE INDICATIONS:  Katrina Wolfe is a  53 y.o. female with a diagnosis of Osteomyelitis Left 5th Metatarsal with Retained Hardware who failed conservative measures and elected for surgical management.    The risks benefits and alternatives were discussed with the patient preoperatively including but not limited to the risks of infection, bleeding, nerve injury, cardiopulmonary complications, the need for revision surgery, among others, and the patient was willing to proceed.  OPERATIVE IMPLANTS: Stimulant antibiotic beads with 500 mg vancomycin and 160 mg gentamicin  @ENCIMAGES @  OPERATIVE FINDINGS: Ulcerative tissue extending down to bone and hardware with a nonunion of the fracture.  OPERATIVE PROCEDURE: Patient was brought the operating room and underwent a general anesthetic.  After adequate levels anesthesia were obtained patient's left lower extremity was prepped using ChloraPrep draped into a sterile field a timeout was called.  A football shaped elliptical incision was made around the ulcerative tissue.  This left a wound that was 10 x 4 cm.  This was carried sharply down to bone and muscle soft tissue and skin was sent for tissue cultures.  There was fibrous tissue on top of the hardware and an osteotome was used to remove all fibrous bone from on top of the hardware.  The hardware was removed without complications the screws were  loose.  Patient had a complete nonunion at the fracture site with no bridging callus.  This was then further debrided back to bleeding viable bone further excision of skin soft tissue muscle and bone that was exposed to the open wound was resected.  The wound was irrigated with normal saline the nonunion site was packed with the stimulant antibiotic beads with 500 mg vancomycin and 160 mg gentamicin.  Local tissue rearrangement was used to close the wound 10 x 4 cm.  A customizable Praveena VAC was applied this had a good suction fit this was overwrapped with Coban patient was extubated taken the PACU in stable condition.   DISCHARGE PLANNING:  Antibiotic duration: We will start IV vancomycin she received 3 g of Kefzol preoperatively.  Cultures pending.  Weightbearing: Nonweightbearing on the left  Pain medication: Opioid pathway  Dressing care/ Wound VAC: Wound VAC in the left to remain in place for 1 week  Ambulatory devices: Walker and crutches  Discharge to: Discharge to home possibly Monday when cultures are finalized.  Follow-up: In the office 1 week post operative.

## 2019-04-05 LAB — GLUCOSE, CAPILLARY
Glucose-Capillary: 196 mg/dL — ABNORMAL HIGH (ref 70–99)
Glucose-Capillary: 168 mg/dL — ABNORMAL HIGH (ref 70–99)
Glucose-Capillary: 150 mg/dL — ABNORMAL HIGH (ref 70–99)
Glucose-Capillary: 175 mg/dL — ABNORMAL HIGH (ref 70–99)

## 2019-04-05 NOTE — Progress Notes (Signed)
Patient ID: Katrina Wolfe, female   DOB: [birth year], 53 y.o.   MRN: [id number] Patient is postoperative day 1 without complaints.  She had removal of deep retained hardware partial excision of the fifth metatarsal she had a large nonunion with an abscess that went down to this area.  The nonunion site is currently packed with antibiotic beads.  There is 100 cc in the wound VAC canister anticipate discharge to home on Monday.

## 2019-04-05 NOTE — Progress Notes (Addendum)
Physical Therapy Treatment Patient Details Name: Katrina Wolfe MRN: [id number] DOB: [birth year] Today's Date: 04/05/2019    History of Present Illness Patient is a 53 y/o female who presents s/p excision of 5th MT bone and removal of hardware secondary to osteomyelitis. PMH includes DM. HTN, HIV, CKD, ca, blindness left eye.    PT Comments    Pt seen for mobility progression. However, she remains very limited with functional mobility secondary to weakness. She required min-mod A x2 for transfers with use of RW. Pt would continue to benefit from skilled physical therapy services at this time while admitted and after d/c to address the below listed limitations in order to improve overall safety and independence with functional mobility.  If pt does not progress, or does not have necessary assistance at home, will need rehab.    Follow Up Recommendations  Home health PT;Supervision for mobility/OOB;Supervision/Assistance - 24 hour     Equipment Recommendations  Wheelchair cushion (measurements PT);Wheelchair (measurements PT);3in1 (PT)    Recommendations for Other Services       Precautions / Restrictions Precautions Precautions: Fall Precaution Comments: wound VAC Restrictions Weight Bearing Restrictions: Yes LLE Weight Bearing: Non weight bearing    Mobility  Bed Mobility Overal bed mobility: Modified Independent                Transfers Overall transfer level: Needs assistance Equipment used: Rolling walker (2 wheeled) Transfers: Sit to/from [facility] Sit to Stand: Mod assist;+2 safety/equipment;From elevated surface Stand pivot transfers: Min assist;+2 safety/equipment       General transfer comment: increased time and effort needed, cueing for safe hand placement and technique, assistance needed to power into standing from EOB and initially for stability as pt very unsteady, min A x2 for pivotal movement to chair towards pt's R  side  Ambulation/Gait             General Gait Details: pt unable to hop on R LE   Stairs             Wheelchair Mobility    Modified Rankin (Stroke Patients Only)       Balance Overall balance assessment: Needs assistance Sitting-balance support: Feet supported;No upper extremity supported Sitting balance-Leahy Scale: Good     Standing balance support: During functional activity;Bilateral upper extremity supported Standing balance-Leahy Scale: Poor Standing balance comment: Requires BUE support and external support in standing                            Cognition Arousal/Alertness: Awake/alert Behavior During Therapy: WFL for tasks assessed/performed Overall Cognitive Status: Within Functional Limits for tasks assessed                                        Exercises General Exercises - Lower Extremity Long Arc Quad: AROM;Strengthening;Left;10 reps;Seated Hip Flexion/Marching: AROM;Strengthening;Left;10 reps;Seated Other Exercises Other Exercises: chair push-ups x5    General Comments        Pertinent Vitals/Pain Pain Assessment: 0-10 Pain Score: 5  Pain Location: L foot Pain Descriptors / Indicators: Sore Pain Intervention(s): Monitored during session;Repositioned    Home Living                      Prior Function            PT Goals (current goals can now be  found in the care plan section) Acute Rehab PT Goals PT Goal Formulation: With patient Time For Goal Achievement: 04/18/19 Potential to Achieve Goals: Good Progress towards PT goals: Progressing toward goals    Frequency    Min 5X/week      PT Plan Current plan remains appropriate    Co-evaluation              AM-PAC PT "6 Clicks" Mobility   Outcome Measure  Help needed turning from your back to your side while in a flat bed without using bedrails?: None Help needed moving from lying on your back to sitting on the side of a  flat bed without using bedrails?: None Help needed moving to and from a bed to a chair (including a wheelchair)?: A Lot Help needed standing up from a chair using your arms (e.g., wheelchair or bedside chair)?: A Lot Help needed to walk in hospital room?: Total Help needed climbing 3-5 steps with a railing? : Total 6 Click Score: 14    End of Session Equipment Utilized During Treatment: Gait belt Activity Tolerance: Patient tolerated treatment well Patient left: in chair;with call bell/phone within reach;with chair alarm set Nurse Communication: Mobility status PT Visit Diagnosis: Difficulty in walking, not elsewhere classified (R26.2);Unsteadiness on feet (R26.81)     Time: 1347-1400 PT Time Calculation (min) (ACUTE ONLY): 13 min  Charges:  $Therapeutic Activity: 8-22 mins                     Sherie Don, [place], DPT  Acute Rehabilitation Services Pager [phone] Office [place] 04/05/2019, 2:59 PM

## 2019-04-06 LAB — GLUCOSE, CAPILLARY
Glucose-Capillary: 106 mg/dL — ABNORMAL HIGH (ref 70–99)
Glucose-Capillary: 152 mg/dL — ABNORMAL HIGH (ref 70–99)
Glucose-Capillary: 196 mg/dL — ABNORMAL HIGH (ref 70–99)
Glucose-Capillary: 118 mg/dL — ABNORMAL HIGH (ref 70–99)

## 2019-04-06 MED ORDER — PIPERACILLIN-TAZOBACTAM 3.375 G IVPB
3.3750 g | Freq: Three times a day (TID) | INTRAVENOUS | Status: DC
  Administered 2019-04-06 – 2019-04-07 (×3): 3.375 g via INTRAVENOUS
  Filled 2019-04-06 (×3): qty 50

## 2019-04-06 NOTE — Progress Notes (Signed)
Nursing called, apparently microbiology called and reported that patient's cultures are growing gram negative rods. I will start zosyn for better broad spectrum directed at gram negative bacteria.

## 2019-04-06 NOTE — Progress Notes (Signed)
Pharmacy Antibiotic Note  Katrina Wolfe is a 53 y.o. female admitted on 04/04/2019 with osteomyelitis.  Pharmacy has been consulted for Vancomycin dosing, now to add Zosyn with Pseudomonas growing within wound culture.   Plan: -Continue vancomycin 1500mg  IV q12h -Add Zosyn 3.375g IV EI q8h -Follow-up cultures, renal function  Height: 5\' 9"  (175.3 cm) Weight: 265 lb (120.2 kg) IBW/kg (Calculated) : 66.2  Temp (24hrs), Avg:98.3 F (36.8 C), Min:98 F (36.7 C), Max:98.6 F (37 C)  Recent Labs  Lab 04/04/19 0620  WBC 9.7  CREATININE 1.03*    Estimated Creatinine Clearance: 88.6 mL/min (A) (by C-G formula based on SCr of 1.03 mg/dL (H)).    Allergies  Allergen Reactions  . Shellfish Allergy Anaphylaxis    All types shellfish  . Bactrim [Sulfamethoxazole-Trimethoprim] Hives    Antimicrobials this admission: Vancomycin 4/17 >>  Zosyn 4/19 >>  Thank you for allowing pharmacy to be a part of this patient's care.  Arrie Senate, PharmD, BCPS Clinical Pharmacist [phone] Please check AMION for all [place] numbers 04/06/2019

## 2019-04-06 NOTE — Progress Notes (Addendum)
Patient ID: Katrina Wolfe, female   DOB: 1966/03/30, 53 y.o.   MRN: [id number] Dressing left forefoot intact, VAC is intact. No complaints.  IV antibiotics and local antibiotic beads. D/C home tomorrow per Dr. Sharol Given.

## 2019-04-06 NOTE — Evaluation (Signed)
Occupational Therapy Evaluation Patient Details Name: Katrina Wolfe MRN: [id number] DOB: 02/19/1966 Today's Date: 04/06/2019    History of Present Illness Patient is a 53 y/o female who presents s/p excision of 5th MT bone and removal of hardware secondary to osteomyelitis. PMH includes DM. HTN, HIV, CKD, ca, blindness left eye.   Clinical Impression   PTA patinet reports independent with ADLs/mobility using RW.  Admitted for above and limited by problem list below, including pain to L LE, NWB L LE, generalized weakness and impaired balance. Pt requires setup for seated UB ADLs, min to mod assist for LB ADLs, mod assist for basic transfers using RW. Patient educated on precautions, safety, DME,  ADL compensatory techniques and recommendations.  Reviewed use of lateral lean techniques for ADLs, use of 3:1 for toilet seat and sponge bathing until improved mobility ease. She reports she will have 24/7 support, and her family can physically assist as needed. Patient will benefit from continued OT services while admitted and after dc at [facility] level in order to optimize independence and safety with ADLs/mobility.     Follow Up Recommendations  Home health OT;Supervision/Assistance - 24 hour    Equipment Recommendations  3 in 1 bedside commode    Recommendations for Other Services       Precautions / Restrictions Precautions Precautions: Fall Precaution Comments: wound VAC Restrictions Weight Bearing Restrictions: Yes LLE Weight Bearing: Non weight bearing      Mobility Bed Mobility Overal bed mobility: Modified Independent             General bed mobility comments: Use of rail and increased time but no assist needed.  Transfers Overall transfer level: Needs assistance Equipment used: Rolling walker (2 wheeled) Transfers: Sit to/from [facility] Sit to Stand: Mod assist;From elevated surface Stand pivot transfers: Mod assist       General transfer comment:  increased time and effort, cueing for hand placement and technique requiring mod assist to power up into standing maintaining NWB L LE with cueing pivoting to chair on R side    Balance Overall balance assessment: Needs assistance Sitting-balance support: Feet supported;No upper extremity supported Sitting balance-Leahy Scale: Good     Standing balance support: During functional activity;Bilateral upper extremity supported Standing balance-Leahy Scale: Poor Standing balance comment: Requires BUE support and external support in standing                           ADL either performed or assessed with clinical judgement   ADL Overall ADL's : Needs assistance/impaired     Grooming: Set up;Sitting   Upper Body Bathing: Set up;Sitting   Lower Body Bathing: Minimal assistance;Sit to/from stand;Sitting/lateral leans Lower Body Bathing Details (indicate cue type and reason): reviewed techniques for lateral lean bathing for increased independence and safety Upper Body Dressing : Set up;Sitting   Lower Body Dressing: Sitting/lateral leans;Moderate assistance;Sit to/from stand Lower Body Dressing Details (indicate cue type and reason): reviewed lateral lean techniques, able to manage R sock in sitting; if standing requires assist to pull pants over hips  Toilet Transfer: Moderate assistance;Stand-pivot;RW Toilet Transfer Details (indicate cue type and reason): simulated to recliner, mod assist to power up to standing          Functional mobility during ADLs: Minimal assistance;Moderate assistance;Rolling walker General ADL Comments: limited by generalized weakness, NWB L LE, impaired balance      Vision Baseline Vision/History: (blind L eye )  Perception     Praxis      Pertinent Vitals/Pain Pain Assessment: Faces Faces Pain Scale: Hurts little more Pain Location: L foot Pain Descriptors / Indicators: Sore Pain Intervention(s): Monitored during  session;Repositioned     Hand Dominance Right   Extremity/Trunk Assessment Upper Extremity Assessment Upper Extremity Assessment: Overall WFL for tasks assessed   Lower Extremity Assessment Lower Extremity Assessment: Defer to PT evaluation       Communication Communication Communication: No difficulties   Cognition Arousal/Alertness: Awake/alert Behavior During Therapy: WFL for tasks assessed/performed Overall Cognitive Status: Within Functional Limits for tasks assessed                                     General Comments       Exercises Other Exercises Other Exercises: chair pushups x 10   Shoulder Instructions      Home Living Family/patient expects to be discharged to:: Private residence Living Arrangements: Spouse/significant other;Children Available Help at Discharge: Family;Available 24 hours/day Type of Home: House Home Access: Stairs to enter [employer] of Steps: 8-9 Entrance Stairs-Rails: Right Home Layout: One level     Bathroom Shower/Tub: Teacher, early years/pre: Standard     Home Equipment: Environmental consultant - 2 wheels;Shower seat;Walker - 4 wheels;Hand held shower head          Prior Functioning/Environment Level of Independence: Independent        Comments: Family helped transport walker up/down stairs. Using RW for ambulation. Does the cooking.        OT Problem List: Decreased strength;Impaired balance (sitting and/or standing);Decreased activity tolerance;Decreased safety awareness;Decreased knowledge of use of DME or AE;Decreased knowledge of precautions;Pain      OT Treatment/Interventions: Self-care/ADL training;Therapeutic exercise;DME and/or AE instruction;Therapeutic activities;Patient/family education;Balance training    OT Goals(Current goals can be found in the care plan section) Acute Rehab OT Goals Patient Stated Goal: to go home OT Goal Formulation: With patient Time For Goal Achievement:  04/20/19 Potential to Achieve Goals: Good  OT Frequency: Min 2X/week   Barriers to D/C:            Co-evaluation              AM-PAC OT "6 Clicks" Daily Activity     Outcome Measure Help from another person eating meals?: None Help from another person taking care of personal grooming?: None Help from another person toileting, which includes using toliet, bedpan, or urinal?: A Lot Help from another person bathing (including washing, rinsing, drying)?: A Little Help from another person to put on and taking off regular upper body clothing?: None Help from another person to put on and taking off regular lower body clothing?: A Lot 6 Click Score: 19   End of Session Equipment Utilized During Treatment: Gait belt;Rolling walker Nurse Communication: Mobility status  Activity Tolerance: Patient tolerated treatment well Patient left: in chair;with call bell/phone within reach;with nursing/sitter in room  OT Visit Diagnosis: Other abnormalities of gait and mobility (R26.89);Pain Pain - Right/Left: Left Pain - part of body: Ankle and joints of foot                Time: 3710-6269 OT Time Calculation (min): 16 min Charges:  OT General Charges $OT Visit: 1 Visit OT Evaluation $OT Eval Moderate Complexity: 1 Mod  Delight Stare, OT Acute Rehabilitation Services Pager [phone] Office [phone]   Delight Stare 04/06/2019,  10:55 AM

## 2019-04-06 NOTE — Progress Notes (Signed)
Received call from micro stating patients culture is growing gram negative rods. ID notified.

## 2019-04-07 ENCOUNTER — Encounter (HOSPITAL_COMMUNITY): Payer: Self-pay | Admitting: Orthopedic Surgery

## 2019-04-07 LAB — BASIC METABOLIC PANEL
Anion gap: 11 (ref 5–15)
BUN: 12 mg/dL (ref 6–20)
CO2: 23 mmol/L (ref 22–32)
Calcium: 8.7 mg/dL — ABNORMAL LOW (ref 8.9–10.3)
Chloride: 103 mmol/L (ref 98–111)
Creatinine, Ser: 1.15 mg/dL — ABNORMAL HIGH (ref 0.44–1.00)
GFR calc Af Amer: >60 mL/min (ref >60)
GFR calc non Af Amer: 55 mL/min — ABNORMAL LOW (ref >60)
Glucose, Bld: 188 mg/dL — ABNORMAL HIGH (ref 70–99)
Potassium: 3.9 mmol/L (ref 3.5–5.1)
Sodium: 137 mmol/L (ref 135–145)

## 2019-04-07 LAB — VANCOMYCIN, TROUGH: Vancomycin Tr: 24 ug/mL (ref 15–20)

## 2019-04-07 LAB — GLUCOSE, CAPILLARY
Glucose-Capillary: 106 mg/dL — ABNORMAL HIGH (ref 70–99)
Glucose-Capillary: 192 mg/dL — ABNORMAL HIGH (ref 70–99)
Glucose-Capillary: 126 mg/dL — ABNORMAL HIGH (ref 70–99)
Glucose-Capillary: 111 mg/dL — ABNORMAL HIGH (ref 70–99)

## 2019-04-07 MED ORDER — SODIUM CHLORIDE 0.9 % IV SOLN
2.0000 g | Freq: Three times a day (TID) | INTRAVENOUS | Status: DC
  Administered 2019-04-07 – 2019-04-08 (×3): 2 g via INTRAVENOUS
  Filled 2019-04-07 (×6): qty 2

## 2019-04-07 NOTE — Care Management (Signed)
    Durable Medical Equipment  (From admission, onward)         Start     Ordered   04/07/19 1108  For home use only DME 3 n 1  Once     04/07/19 1107   04/07/19 1107  For home use only DME lightweight manual wheelchair with seat cushion  Once    Comments:  Patient suffers from  [place], REMOVE DEEP HARDWARE LEFT 5TH METATARSAL, Excision of infected skin and soft tissue and muscle. Local tissue rearrangement for wound closure 4 x 10 cm. Application of wound VAC. which impairs their ability to perform daily activities like ambulating in the home.  A cane will not resolve  issue with performing activities of daily living. A wheelchair will allow patient to safely perform daily activities. Patient is not able to propel themselves in the home using a standard weight wheelchair due to [place], REMOVE DEEP HARDWARE LEFT 5TH METATARSAL, Excision of infected skin and soft tissue and muscle. Local tissue rearrangement for wound closure 4 x 10 cm. Application of wound VAC.Marland Kitchen Patient can self propel in the lightweight wheelchair.  Accessories: elevating leg rests (ELRs), wheel locks, extensions and anti-tippers.  Seat and back cushion   04/07/19 1107

## 2019-04-07 NOTE — Progress Notes (Signed)
Occupational Therapy Treatment Patient Details Name: Katrina Wolfe MRN: [id number] DOB: 19-Jul-1966 Today's Date: 04/07/2019    History of present illness Patient is a 53 y/o female who presents s/p excision of 5th MT bone and removal of hardware secondary to osteomyelitis. PMH includes DM. HTN, HIV, CKD, ca, blindness left eye.   OT comments  Pt progressing towards established OT goals. Pt performing functional transfers with Min Guard-Min A for safety and stability; reliant on RW for support. Pt maintaining WB status throughout session. Educating pt on AE, lateral leaning techniques, and management of wound vac during LB ADLs. Pt motivated to participate in therapy and reports her husband and son will be available to assist her at home. Continue to recommend dc to home with HHOT.    Follow Up Recommendations  Home health OT;Supervision/Assistance - 24 hour    Equipment Recommendations  3 in 1 bedside commode    Recommendations for Other Services      Precautions / Restrictions Precautions Precautions: Fall Precaution Comments: wound VAC Restrictions Weight Bearing Restrictions: Yes LLE Weight Bearing: Non weight bearing       Mobility Bed Mobility Overal bed mobility: Modified Independent             General bed mobility comments: Use of rail and increased time but no assist needed.  Transfers Overall transfer level: Needs assistance Equipment used: Rolling walker (2 wheeled) Transfers: Sit to/from [facility] Sit to Stand: From elevated surface;Min assist Stand pivot transfers: Min guard       General transfer comment: Pt requiring elevated surface to stand from due to weakness and Min A for power up into standing. Min Guard A required for safety during pivot to recliner. Pt maintaining NWB status well. Decreased safety with descent and educating pt on using UEs to lower herself carefully.    Balance Overall balance assessment: Needs  assistance Sitting-balance support: Feet supported;No upper extremity supported Sitting balance-Leahy Scale: Good     Standing balance support: During functional activity;Bilateral upper extremity supported Standing balance-Leahy Scale: Poor Standing balance comment: Requires BUE support and external support in standing                           ADL either performed or assessed with clinical judgement   ADL Overall ADL's : Needs assistance/impaired Eating/Feeding: Independent             Lower Body Bathing Details (indicate cue type and reason): Educating pt on bathing techniques     Lower Body Dressing: Minimal assistance;Sit to/from stand;Sitting/lateral leans Lower Body Dressing Details (indicate cue type and reason): Educating pt on management of wouind vac and on AE for LB dressing to optimize safety and independence. Also educating pt on laterally leaning to don LB clothing due to decreased balance in standing.  Toilet Transfer: Scientist, product/process development Details (indicate cue type and reason): Simulated to recliner with Min Guard A for safety         Functional mobility during ADLs: Rolling walker;Min guard(stand pivot to recliner) General ADL Comments: Motivated to participate in therapy     Vision       Perception     Praxis      Cognition Arousal/Alertness: Awake/alert Behavior During Therapy: WFL for tasks assessed/performed Overall Cognitive Status: Within Functional Limits for tasks assessed  General Comments: Motivated to partcipate in therapyu        Exercises     Shoulder Instructions       General Comments Wound vac in place    Pertinent Vitals/ Pain       Pain Assessment: Faces Faces Pain Scale: Hurts little more Pain Location: L foot Pain Descriptors / Indicators: Sore Pain Intervention(s): Monitored during session;Limited activity within patient's  tolerance;Repositioned  Home Living                                          Prior Functioning/Environment              Frequency  Min 2X/week        Progress Toward Goals  OT Goals(current goals can now be found in the care plan section)  Progress towards OT goals: Progressing toward goals  Acute Rehab OT Goals Patient Stated Goal: to go home OT Goal Formulation: With patient Time For Goal Achievement: 04/20/19 Potential to Achieve Goals: Good ADL Goals Pt Will Perform Lower Body Bathing: with supervision;sitting/lateral leans;with adaptive equipment Pt Will Perform Lower Body Dressing: with supervision;sitting/lateral leans;with adaptive equipment Pt Will Transfer to Toilet: with supervision;stand pivot transfer;squat pivot transfer;bedside commode Pt Will Perform Toileting - Clothing Manipulation and hygiene: sitting/lateral leans;with supervision  Plan Discharge plan remains appropriate    Co-evaluation                 AM-PAC OT "6 Clicks" Daily Activity     Outcome Measure   Help from another person eating meals?: None Help from another person taking care of personal grooming?: None Help from another person toileting, which includes using toliet, bedpan, or urinal?: A Little Help from another person bathing (including washing, rinsing, drying)?: A Little Help from another person to put on and taking off regular upper body clothing?: None Help from another person to put on and taking off regular lower body clothing?: A Little 6 Click Score: 21    End of Session Equipment Utilized During Treatment: Rolling walker  OT Visit Diagnosis: Other abnormalities of gait and mobility (R26.89);Pain Pain - Right/Left: Left Pain - part of body: Ankle and joints of foot   Activity Tolerance Patient tolerated treatment well   Patient Left in chair;with call bell/phone within reach   Nurse Communication Mobility status        Time:  6433-2951 OT Time Calculation (min): 16 min  Charges: OT General Charges $OT Visit: 1 Visit OT Treatments $Self Care/Home Management : 8-22 mins  [place], OTR/L Acute Rehab Pager: [phone] Office: [place] 04/07/2019, 8:29 AM

## 2019-04-07 NOTE — Plan of Care (Signed)

## 2019-04-07 NOTE — Progress Notes (Signed)
Physical Therapy Treatment Patient Details Name: Katrina Wolfe MRN: [id number] DOB: 1966-07-07 Today's Date: 04/07/2019    History of Present Illness Patient is a 53 y/o female who presents s/p excision of 5th MT bone and removal of hardware secondary to osteomyelitis. PMH includes DM. HTN, HIV, CKD, ca, blindness left eye.    PT Comments    Pt performed functional mobility including sit to stand and stand pivot.  Pt requiring moderate assistance to stand and min assistance to pivot.  Poor eccentric loading to return to seated surface.  Plan for HHPT and support from family remain appropriate.  Pt limited due to weakness in R quad.  She is unable to progress gait.     Follow Up Recommendations  Home health PT;Supervision for mobility/OOB;Supervision/Assistance - 24 hour     Equipment Recommendations  Wheelchair cushion (measurements PT);Wheelchair (measurements PT);3in1 (PT)    Recommendations for Other Services       Precautions / Restrictions Precautions Precautions: Fall Precaution Comments: wound VAC Restrictions Weight Bearing Restrictions: Yes LLE Weight Bearing: Non weight bearing    Mobility  Bed Mobility Overal bed mobility: Modified Independent             General bed mobility comments: Use of rail and increased time but no assist needed to return to bed.    Transfers Overall transfer level: Needs assistance Equipment used: Rolling walker (2 wheeled) Transfers: Sit to/from [facility] Sit to Stand: Mod assist Stand pivot transfers: Min assist       General transfer comment: Pt required moderate assistance to stand from comode and recliner.  Pt performed stand pivot x2 with poor eccentric loading returning to seated surface after 1 st stand pivot.    Ambulation/Gait Ambulation/Gait assistance: (NT unable due to weakness in R knee.  )           General Gait Details: pt unable to hop on R LE   Stairs             Wheelchair  Mobility    Modified Rankin (Stroke Patients Only)       Balance Overall balance assessment: Needs assistance   Sitting balance-Leahy Scale: Good       Standing balance-Leahy Scale: Poor Standing balance comment: Requires BUE support and external support in standing                            Cognition Arousal/Alertness: Awake/alert Behavior During Therapy: WFL for tasks assessed/performed Overall Cognitive Status: Within Functional Limits for tasks assessed                                 General Comments: Motivated to partcipate in therapy      Exercises General Exercises - Lower Extremity Ankle Circles/Pumps: AROM;Both;10 reps;Supine Quad Sets: AROM;Both;10 reps;Supine Heel Slides: AAROM;Both;10 reps;Supine;AROM Hip ABduction/ADduction: AROM;AAROM;Both;10 reps;Supine Straight Leg Raises: AAROM;Both;10 reps;Supine    General Comments        Pertinent Vitals/Pain Pain Assessment: Faces Pain Score: 5  Pain Location: L foot Pain Descriptors / Indicators: Sore Pain Intervention(s): Monitored during session;Repositioned    Home Living                      Prior Function            PT Goals (current goals can now be found in the care plan section) Acute  Rehab PT Goals Patient Stated Goal: to go home Potential to Achieve Goals: Good Progress towards PT goals: Progressing toward goals    Frequency    Min 5X/week      PT Plan Current plan remains appropriate    Co-evaluation              AM-PAC PT "6 Clicks" Mobility   Outcome Measure  Help needed turning from your back to your side while in a flat bed without using bedrails?: None Help needed moving from lying on your back to sitting on the side of a flat bed without using bedrails?: None Help needed moving to and from a bed to a chair (including a wheelchair)?: A Lot Help needed standing up from a chair using your arms (e.g., wheelchair or bedside chair)?:  A Lot Help needed to walk in hospital room?: Total Help needed climbing 3-5 steps with a railing? : Total 6 Click Score: 14    End of Session Equipment Utilized During Treatment: Gait belt Activity Tolerance: Patient tolerated treatment well Patient left: in chair;with call bell/phone within reach;with chair alarm set Nurse Communication: Mobility status PT Visit Diagnosis: Difficulty in walking, not elsewhere classified (R26.2);Unsteadiness on feet (R26.81)     Time: 3557-3220 PT Time Calculation (min) (ACUTE ONLY): 18 min  Charges:  $Therapeutic Activity: 8-22 mins                     Governor Rooks, PTA Acute Rehabilitation Services Pager [phone] Office [phone]     Talar Fraley Eli Hose 04/07/2019, 5:32 PM

## 2019-04-07 NOTE — TOC Initial Note (Addendum)
Transition of Care [facility]) - Initial/Assessment Note    Patient Details  Name: Katrina Wolfe MRN: [id number] Date of Birth: 08/09/1966  [facility] CM/SW Contact:    Marilu Favre, RN Phone Number: 04/07/2019, 10:59 AM  Clinical Narrative:                 Patient from home. Lives with her husband and has an adult son home for a few days at present  Provided Medicare.gov home health agency list. Patient is reviewing.   Confirmed face sheet information.   Patient will discharge with Preveena VAC. Same in room and patient understanding nurse will change her from hospital VAC to [facility] prior to discharge and it stays on about one week.   Patient aware she may discharge to home with IV antibiotics and she will be taught how to adminster IV ABX because home health RN will not be present every time a dose is due.   Patient has rolling walker at home, needs 3 in 1 and wheelchair, same ordered through [place]  Patient decided on [place], referral given to [facility]  Expected Discharge Plan: [place] Barriers to Discharge: Continued Medical Work up   Patient Goals and CMS Choice Patient states their goals for this hospitalization and ongoing recovery are:: to go home  CMS Medicare.gov Compare Post Acute Care list provided to:: Patient Choice offered to / list presented to : Patient  Expected Discharge Plan and Services Expected Discharge Plan: [place]   Discharge Planning Services: CM Consult Post Acute Care Choice: Home Health, Durable Medical Equipment Living arrangements for the past 2 months: Single Family Home Expected Discharge Date: 04/07/19               DME Arranged: 3-N-1, Youth worker wheelchair with seat cushion DME Agency: [facility] HH Arranged: PT, OT HH Agency: [place]  Prior Living Arrangements/Services Living arrangements for the past 2 months: Single Family  Home Lives with:: Spouse Patient language and need for interpreter reviewed:: Yes Do you feel safe going back to the place where you live?: Yes      Need for Family Participation in Patient Care: No (Comment) Care giver support system in place?: No (comment) Current home services: DME Criminal Activity/Legal Involvement Pertinent to Current Situation/Hospitalization: No - Comment as needed  Activities of Daily Living Home Assistive Devices/Equipment: Walker (specify type), CBG Meter, Blood pressure cuff, Eyeglasses, Shower chair with back ADL Screening (condition at time of admission) Patient's cognitive ability adequate to safely complete daily activities?: Yes Is the patient deaf or have difficulty hearing?: No Does the patient have difficulty seeing, even when wearing glasses/contacts?: No Does the patient have difficulty concentrating, remembering, or making decisions?: No Patient able to express need for assistance with ADLs?: Yes Does the patient have difficulty dressing or bathing?: No Independently performs ADLs?: Yes (appropriate for developmental age) Does the patient have difficulty walking or climbing stairs?: Yes Weakness of Legs: Left Weakness of Arms/Hands: None  Permission Sought/Granted Permission sought to share information with : Case Manager Permission granted to share information with : Yes, Verbal Permission Granted     Permission granted to share info w AGENCY: [facility]         Emotional Assessment Appearance:: Appears stated age Attitude/Demeanor/Rapport: Engaged Affect (typically observed): Accepting Orientation: : Oriented to Self, Oriented to Place, Oriented to  Time, Oriented to Situation   Psych Involvement: No (comment)  Admission diagnosis:  Osteomyelitis Left 5th Metatarsal with Retained Hardware Patient Active Problem List   Diagnosis Date Noted  . Osteomyelitis of foot ([place]) 04/04/2019  . Hardware complicating wound infection  ([place])   . Subacute osteomyelitis, left ankle and foot ([place])   . Open displaced fracture of fifth metatarsal bone of left foot   . Healthcare maintenance 04/03/2019  . HIV disease ([place]) 10/27/2015  . DM type 2 (diabetes mellitus, type 2) ([place]) 10/27/2015  . Hyperlipidemia 10/27/2015  . Essential hypertension 10/27/2015  . Severe vulvar dysplasia, histologically confirmed 10/15/2015   PCP:  Wenda Low, MD Pharmacy:   [facility], [address] [place] [address] Phone: [phone] Fax: [phone]     Social Determinants of Health (SDOH) Interventions    Readmission Risk Interventions No flowsheet data found.

## 2019-04-07 NOTE — Progress Notes (Signed)
CRITICAL VALUE ALERT  Critical Value:  Vanc 24  Date & Time Notied:  04/07/2019 at 1250  Provider Notified: Sharol Given, MD  Orders Received/Actions taken: No new orders at this time

## 2019-04-07 NOTE — Progress Notes (Signed)
Subjective: 3 Days Post-Op Procedure(s) (LRB): EXCISION BONE AND REMOVE DEEP HARDWARE LEFT 5TH METATARSAL (Left) Patient reports pain as mild.    Objective: Vital signs in last 24 hours: Temp:  [98.2 F (36.8 C)-98.6 F (37 C)] 98.4 F (36.9 C) (04/20 0500) Pulse Rate:  [81-86] 83 (04/20 0900) Resp:  [17-18] 18 (04/20 0500) BP: (134-155)/(62-79) 155/78 (04/20 0900) SpO2:  [97 %-99 %] 98 % (04/20 0500)  Intake/Output from previous day: 04/19 0701 - 04/20 0700 In: 3214 [P.O.:537; IV Piggyback:2677] Out: 20 [Drains:20] Intake/Output this shift: Total I/O In: 100 [P.O.:100] Out: 550 [Urine:550]  No results for input(s): HGB in the last 72 hours. No results for input(s): WBC, RBC, HCT, PLT in the last 72 hours. No results for input(s): NA, K, CL, CO2, BUN, CREATININE, GLUCOSE, CALCIUM in the last 72 hours. No results for input(s): LABPT, INR in the last 72 hours.  VAC dressing intact over the left foot, VAC canister with 100 cc of drainage ( total of 150 cc in canister currently) .   Assessment/Plan: 3 Days Post-Op Procedure(s) (LRB): EXCISION BONE AND REMOVE DEEP HARDWARE LEFT 5TH METATARSAL (Left) Operative cultures with Pseuodomonas a. , Serratia m. And Staph a. With Staph a. - will hopefully be able to DC on po Cipro tomorrow. Will plan to switch to [facility] likely tomorrow.     Erlinda Hong, PA-C 04/07/2019, 11:46 AM  [place]

## 2019-04-08 LAB — AEROBIC/ANAEROBIC CULTURE W GRAM STAIN (SURGICAL/DEEP WOUND)

## 2019-04-08 LAB — AEROBIC/ANAEROBIC CULTURE (SURGICAL/DEEP WOUND)

## 2019-04-08 LAB — GLUCOSE, CAPILLARY: Glucose-Capillary: 109 mg/dL — ABNORMAL HIGH (ref 70–99)

## 2019-04-08 MED ORDER — CIPROFLOXACIN HCL 500 MG PO TABS: 500.0000 mg | ORAL_TABLET | Freq: Two times a day (BID) | ORAL | 0 refills | Status: DC

## 2019-04-08 MED ORDER — HYDROCODONE-ACETAMINOPHEN 5-325 MG PO TABS: 1.0000 | ORAL_TABLET | ORAL | 0 refills | Status: DC | PRN

## 2019-04-08 NOTE — Progress Notes (Signed)
Occupational Therapy Treatment Patient Details Name: Katrina Wolfe MRN: [id number] DOB: 1966/12/12 Today's Date: 04/08/2019    History of present illness Patient is a 53 y/o female who presents s/p excision of 5th MT bone and removal of hardware secondary to osteomyelitis. PMH includes DM. HTN, HIV, CKD, ca, blindness left eye.   OT comments  Pt demonstrates ability to complete sponge bath and basic stand pivot transfer with RW min (A) from elevated surface. Pt unsuccessful sit<>stand from low surface x4 during session. Pt able to maintain NWB L LE. Pt will have family support and educated about home setup for safety. Pt educated on snacks and BSC being at stand pivot distance when alone to prevent falls.   Follow Up Recommendations  Home health OT;Supervision/Assistance - 24 hour    Equipment Recommendations  (all DME delivered and in room)    Recommendations for Other Services      Precautions / Restrictions Precautions Precautions: Fall Precaution Comments: wound VAC Restrictions Weight Bearing Restrictions: Yes LLE Weight Bearing: Non weight bearing       Mobility Bed Mobility Overal bed mobility: Modified Independent                Transfers Overall transfer level: Needs assistance Equipment used: Rolling walker (2 wheeled) Transfers: Sit to/from [facility] Sit to Stand: Min assist;From elevated surface Stand pivot transfers: Min guard       General transfer comment: pt requires elevated surface to help with transfers. pt educated on use of pillows or beach towels to make surfaces higher     Balance                                           ADL either performed or assessed with clinical judgement   ADL Overall ADL's : Needs assistance/impaired Eating/Feeding: Independent   Grooming: Modified independent;Sitting   Upper Body Bathing: Modified independent   Lower Body Bathing: Supervison/ safety;Bed level(lateral  leans)   Upper Body Dressing : Independent   Lower Body Dressing: Modified independent Lower Body Dressing Details (indicate cue type and reason): pt able to thread wound vac through clothing and dressing LB for home Toilet Transfer: Min guard;RW Toilet Transfer Details (indicate cue type and reason): pt is only able to stand pivot at this time           General ADL Comments: pt at EOB bathing on arrival. pt demonstrates LB bathing with lateral leans . pt educated on w/c locking brakes, cushions, elevating leg rest and use of rails on wheels. pt positioned in chair for breakfast     Vision       Perception     Praxis      Cognition Arousal/Alertness: Awake/alert Behavior During Therapy: WFL for tasks assessed/performed Overall Cognitive Status: Within Functional Limits for tasks assessed                                          Exercises     Shoulder Instructions       General Comments wound vac changes during session by RN. pt educated on wearing wound vac necklace and how to manage    Pertinent Vitals/ Pain       Pain Assessment: No/denies pain  Home Living  Prior Functioning/Environment              Frequency  Min 2X/week        Progress Toward Goals  OT Goals(current goals can now be found in the care plan section)  Progress towards OT goals: Progressing toward goals;Goals met/education completed, patient discharged from OT  Acute Rehab OT Goals Patient Stated Goal: to go home OT Goal Formulation: With patient Time For Goal Achievement: 04/20/19 Potential to Achieve Goals: Good ADL Goals Pt Will Perform Lower Body Bathing: with supervision;sitting/lateral leans;with adaptive equipment Pt Will Perform Lower Body Dressing: with supervision;sitting/lateral leans;with adaptive equipment Pt Will Transfer to Toilet: with supervision;stand pivot transfer;squat pivot  transfer;bedside commode Pt Will Perform Toileting - Clothing Manipulation and hygiene: sitting/lateral leans;with supervision  Plan Discharge plan remains appropriate    Co-evaluation                 AM-PAC OT "6 Clicks" Daily Activity     Outcome Measure   Help from another person eating meals?: None Help from another person taking care of personal grooming?: None Help from another person toileting, which includes using toliet, bedpan, or urinal?: A Little Help from another person bathing (including washing, rinsing, drying)?: A Little Help from another person to put on and taking off regular upper body clothing?: None Help from another person to put on and taking off regular lower body clothing?: A Little 6 Click Score: 21    End of Session Equipment Utilized During Treatment: Rolling walker  OT Visit Diagnosis: Other abnormalities of gait and mobility (R26.89);Pain Pain - Right/Left: Left Pain - part of body: Ankle and joints of foot   Activity Tolerance Patient tolerated treatment well   Patient Left in chair;with call bell/phone within reach;with chair alarm set   Nurse Communication Mobility status;Precautions;Weight bearing status        Time: 0830-0900 OT Time Calculation (min): 30 min  Charges: OT General Charges $OT Visit: 1 Visit OT Treatments $Self Care/Home Management : 23-37 mins   Jeri Modena, OTR/L  Acute Rehabilitation Services Pager: [phone] Office: [phone] .    Jeri Modena 04/08/2019, 9:52 AM

## 2019-04-08 NOTE — Progress Notes (Signed)
Subjective: 4 Days Post-Op Procedure(s) (LRB): EXCISION BONE AND REMOVE DEEP HARDWARE LEFT 5TH METATARSAL (Left) Patient reports pain as mild.    Objective: Vital signs in last 24 hours: Temp:  [98.8 F (37.1 C)-100.2 F (37.9 C)] 100.2 F (37.9 C) (04/21 0419) Pulse Rate:  [82-97] 87 (04/21 0419) Resp:  [16-17] 16 (04/21 0419) BP: (116-172)/(66-87) 137/76 (04/21 0419) SpO2:  [96 %-98 %] 97 % (04/21 0419)  Intake/Output from previous day: 04/20 0701 - 04/21 0700 In: 520 [P.O.:320; IV Piggyback:200] Out: 900 [Urine:875; Drains:25] Intake/Output this shift: No intake/output data recorded.  No results for input(s): HGB in the last 72 hours. No results for input(s): WBC, RBC, HCT, PLT in the last 72 hours. Recent Labs    04/07/19 1132  NA 137  K 3.9  CL 103  CO2 23  BUN 12  CREATININE 1.15*  GLUCOSE 188*  CALCIUM 8.7*   No results for input(s): LABPT, INR in the last 72 hours.  VAC intact and functioning well over the left foot and about 25 cc drainage over the past 24 hours (175 cc total in canister)    Assessment/Plan: 4 Days Post-Op Procedure(s) (LRB): EXCISION BONE AND REMOVE DEEP HARDWARE LEFT 5TH METATARSAL (Left)  DC home with HHC ID- Pseudomonas and Serratia both sensitive to Cipro. Will DC home on Cipro x 4 weeks. DC with Prevena VAC. Follow up in the office next Monday.    Erlinda Hong, PA-C 04/08/2019, 7:06 AM  [place]

## 2019-04-08 NOTE — Progress Notes (Signed)
Katrina Wolfe to be D/C'd  per MD order. Discussed with the patient and all questions fully answered.  VSS, Skin clean, dry and intact without evidence of skin break down, no evidence of skin tears noted.  I.  An After Visit Summary was printed and given to the patient. Patient received prescription.  D/c education completed with patient/family including follow up instructions, medication list, d/c activities limitations if indicated, with other d/c instructions as indicated by MD - patient able to verbalize understanding, all questions fully answered.   Patient instructed to return to ED, call 911, or call MD for any changes in condition.   Patient to be escorted via [place], and D/C home via private auto.

## 2019-04-08 NOTE — Progress Notes (Addendum)
Physical Therapy Treatment Patient Details Name: Katrina Wolfe MRN: [id number] DOB: 07-08-1966 Today's Date: 04/08/2019    History of Present Illness Patient is a 53 y/o female who presents s/p excision of 5th MT bone and removal of hardware secondary to osteomyelitis. PMH includes DM. HTN, HIV, CKD, ca, blindness left eye.    PT Comments    On arrival patient supine in bed.  Educated pt on use of WC and lengthened foot rests for home use.  Pt WC in room too small and informed CM to swap for larger size for d/c home.  Pt performed WC mobility, application of brakes and how to remove leg rest.  Plan for HHPT this am.   Follow Up Recommendations  Home health PT;Supervision for mobility/OOB;Supervision/Assistance - 24 hour     Equipment Recommendations  Wheelchair cushion (measurements PT);Wheelchair (measurements PT);3in1 (PT)    Recommendations for Other Services       Precautions / Restrictions Precautions Precautions: Fall Precaution Comments: wound VAC Restrictions Weight Bearing Restrictions: Yes LLE Weight Bearing: Non weight bearing    Mobility  Bed Mobility Overal bed mobility: Modified Independent             General bed mobility comments: Use of rail and increased time but no assist needed to return to bed.    Transfers Overall transfer level: Needs assistance Equipment used: Rolling walker (2 wheeled) Transfers: Sit to/from [facility] Sit to Stand: Min assist;From elevated surface Stand pivot transfers: Min guard       General transfer comment: Pt required cues for hand placement and forward weight shifting to improve ease of transfers.    Ambulation/Gait Ambulation/Gait assistance: (NT)               Theme park manager mobility: Yes Wheelchair propulsion: Both upper extremities Wheelchair parts: Supervision/cueing Distance: [address] Details  (indicate cue type and reason): Cues for turning and forward propulsion.  Lengthened foot rests and educated on parts of the WC.    Modified Rankin (Stroke Patients Only)       Balance     Sitting balance-Leahy Scale: Good       Standing balance-Leahy Scale: Poor                              Cognition Arousal/Alertness: Awake/alert Behavior During Therapy: WFL for tasks assessed/performed Overall Cognitive Status: Within Functional Limits for tasks assessed                                 General Comments: Motivated to partcipate in therapy      Exercises      General Comments        Pertinent Vitals/Pain Pain Assessment: No/denies pain Faces Pain Scale: Hurts little more Pain Location: L foot Pain Descriptors / Indicators: Sore Pain Intervention(s): Monitored during session;Repositioned    Home Living                      Prior Function            PT Goals (current goals can now be found in the care plan section) Acute Rehab PT Goals Patient Stated Goal: to go home Potential to Achieve Goals: Good Progress towards PT goals: Progressing toward goals  Frequency    Min 5X/week      PT Plan Current plan remains appropriate    Co-evaluation              AM-PAC PT "6 Clicks" Mobility   Outcome Measure  Help needed turning from your back to your side while in a flat bed without using bedrails?: None Help needed moving from lying on your back to sitting on the side of a flat bed without using bedrails?: None Help needed moving to and from a bed to a chair (including a wheelchair)?: A Little Help needed standing up from a chair using your arms (e.g., wheelchair or bedside chair)?: A Little Help needed to walk in hospital room?: Total Help needed climbing 3-5 steps with a railing? : Total 6 Click Score: 16    End of Session Equipment Utilized During Treatment: Gait belt Activity Tolerance: Patient tolerated  treatment well Patient left: in chair;with call bell/phone within reach;with chair alarm set Nurse Communication: Mobility status PT Visit Diagnosis: Difficulty in walking, not elsewhere classified (R26.2);Unsteadiness on feet (R26.81)     Time: 0930-1000 PT Time Calculation (min) (ACUTE ONLY): 30 min  Charges:  $Therapeutic Activity: 8-22 mins $Wheel Chair Management: 8-22 mins                     Governor Rooks, PTA Acute Rehabilitation Services Pager [phone] Office [phone]     Danira Nylander Eli Hose 04/08/2019, 5:51 PM

## 2019-04-08 NOTE — Discharge Instructions (Signed)
Keep left foot elevated as much as possible and avoid weight bearing on left foot as much as possible.   Keep Prevena VAC plugged into wall outlet whenever possible to keep machine charged.

## 2019-04-08 NOTE — Discharge Summary (Signed)
Discharge Diagnoses:  Active Problems:   Hardware complicating wound infection ([place])   Subacute osteomyelitis, left ankle and foot ([place])   Open displaced fracture of fifth metatarsal bone of left foot   Osteomyelitis of foot (HCC)   Surgeries: Procedure(s): EXCISION BONE AND REMOVE DEEP HARDWARE LEFT 5TH METATARSAL on 04/04/2019    Consultants:   Discharged Condition: Improved  Hospital Course: Katrina Wolfe is an 53 y.o. female who was admitted 04/04/2019 with a chief complaint of ulcer over left lateral foot, with a final diagnosis of Osteomyelitis Left 5th Metatarsal with Retained Hardware.  Patient was brought to the operating room on 04/04/2019 and underwent Procedure(s): EXCISION BONE AND REMOVE DEEP HARDWARE LEFT 5TH METATARSAL.    Patient was given perioperative antibiotics:  Anti-infectives (From admission, onward)   Start     Dose/Rate Route Frequency Ordered Stop   04/08/19 0000  ciprofloxacin (CIPRO) 500 MG tablet     500 mg Oral 2 times daily 04/08/19 0713 05/08/19 2359   04/07/19 1400  ceFEPIme (MAXIPIME) 2 g in sodium chloride 0.9 % 100 mL IVPB     2 g 200 mL/hr over 30 Minutes Intravenous Every 8 hours 04/07/19 1212     04/06/19 1700  piperacillin-tazobactam (ZOSYN) IVPB 3.375 g  Status:  Discontinued     3.375 g 12.5 mL/hr over 240 Minutes Intravenous Every 8 hours 04/06/19 1648 04/07/19 1212   04/05/19 0030  vancomycin (VANCOCIN) 1,500 mg in sodium chloride 0.9 % 500 mL IVPB  Status:  Discontinued     1,500 mg 250 mL/hr over 120 Minutes Intravenous Every 12 hours 04/04/19 1156 04/07/19 1212   04/04/19 1200  vancomycin (VANCOCIN) 2,000 mg in sodium chloride 0.9 % 500 mL IVPB     2,000 mg 250 mL/hr over 120 Minutes Intravenous  Once 04/04/19 1156 04/04/19 1502   04/04/19 1130  bictegravir-emtricitabine-tenofovir AF (BIKTARVY) 50-200-25 MG per tablet 1 tablet     1 tablet Oral Daily 04/04/19 1118     04/04/19 0750  gentamicin (GARAMYCIN) injection  Status:   Discontinued       As needed 04/04/19 0753 04/04/19 0821   04/04/19 0748  vancomycin (VANCOCIN) powder  Status:  Discontinued       As needed 04/04/19 0753 04/04/19 0821   04/04/19 0600  ceFAZolin (ANCEF) IVPB 2g/100 mL premix     2 g 200 mL/hr over 30 Minutes Intravenous On call to O.R. 04/04/19 4696 04/04/19 0740    .  Patient was given sequential compression devices, early ambulation, and aspirin for DVT prophylaxis.  Recent vital signs:  Patient Vitals for the past 24 hrs:  BP Temp Temp src Pulse Resp SpO2  04/08/19 0419 137/76 100.2 F (37.9 C) Oral 87 16 97 %  04/07/19 2117 (!) 172/87 99 F (37.2 C) Oral 97 16 97 %  04/07/19 2114 (!) 170/81 99.8 F (37.7 C) Oral 96 - 96 %  04/07/19 1358 116/66 98.8 F (37.1 C) Oral 82 17 98 %  04/07/19 0900 (!) 155/78 - - 83 - -  .  Recent laboratory studies: No results found.  Discharge Medications:   Allergies as of 04/08/2019      Reactions   Shellfish Allergy Anaphylaxis   All types shellfish   Bactrim [sulfamethoxazole-trimethoprim] Hives      Medication List    TAKE these medications   aspirin EC 81 MG tablet Take 1 tablet (81 mg total) by mouth 2 (two) times daily.   aspirin-acetaminophen-caffeine 250-250-65 MG tablet Commonly  known as:  EXCEDRIN MIGRAINE Take 2 tablets by mouth daily as needed for headache or migraine.   atorvastatin 10 MG tablet Commonly known as:  LIPITOR Take 10 mg by mouth every morning.   atropine 1 % ophthalmic solution Place 1 drop into both eyes 2 (two) times daily.   bictegravir-emtricitabine-tenofovir AF 50-200-25 MG Tabs tablet Commonly known as:  BIKTARVY Take 1 tablet by mouth daily.   calcium carbonate 500 MG chewable tablet Commonly known as:  TUMS - dosed in mg elemental calcium Chew 1 tablet by mouth as needed for indigestion or heartburn.   ciprofloxacin 500 MG tablet Commonly known as:  Cipro Take 1 tablet (500 mg total) by mouth 2 (two) times daily for 30 days.    cloNIDine 0.3 MG tablet Commonly known as:  CATAPRES Take 0.3 mg by mouth 2 (two) times daily.   gabapentin 100 MG capsule Commonly known as:  NEURONTIN Take 100 mg by mouth 3 (three) times daily.   glimepiride 2 MG tablet Commonly known as:  AMARYL Take 2 mg by mouth daily.   HumaLOG Mix 75/25 KwikPen (75-25) 100 UNIT/ML Kwikpen Generic drug:  Insulin Lispro Prot & Lispro Inject 56 Units into the skin 2 (two) times daily.   HYDROcodone-acetaminophen 5-325 MG tablet Commonly known as:  NORCO/VICODIN Take 1 tablet by mouth every 4 (four) hours as needed for moderate pain. What changed:  when to take this   INSULIN SYRINGE 1CC/31GX5/16" 31G X 5/16" 1 ML Misc 1 Units by Does not apply route 2 (two) times daily.   lisinopril 10 MG tablet Commonly known as:  ZESTRIL Take 1 tablet (10 mg total) by mouth daily.   lisinopril-hydrochlorothiazide 20-25 MG tablet Commonly known as:  ZESTORETIC Take 1 tablet by mouth daily.   metFORMIN 1000 MG tablet Commonly known as:  GLUCOPHAGE Take 1,000 mg by mouth daily with breakfast.   ONE TOUCH ULTRA TEST test strip Generic drug:  glucose blood   onetouch ultrasoft lancets   Victoza 18 MG/3ML Sopn Generic drug:  liraglutide Inject 1.8 mg into the skin every morning.            Durable Medical Equipment  (From admission, onward)         Start     Ordered   04/07/19 1108  For home use only DME 3 n 1  Once     04/07/19 1107   04/07/19 1107  For home use only DME lightweight manual wheelchair with seat cushion  Once    Comments:  Patient suffers from  [place], REMOVE DEEP HARDWARE LEFT 5TH METATARSAL, Excision of infected skin and soft tissue and muscle. Local tissue rearrangement for wound closure 4 x 10 cm. Application of wound VAC. which impairs their ability to perform daily activities like ambulating in the home.  A cane will not resolve  issue with performing activities of daily living. A  wheelchair will allow patient to safely perform daily activities. Patient is not able to propel themselves in the home using a standard weight wheelchair due to [place], REMOVE DEEP HARDWARE LEFT 5TH METATARSAL, Excision of infected skin and soft tissue and muscle. Local tissue rearrangement for wound closure 4 x 10 cm. Application of wound VAC.Marland Kitchen Patient can self propel in the lightweight wheelchair.  Accessories: elevating leg rests (ELRs), wheel locks, extensions and anti-tippers.  Seat and back cushion   04/07/19 1107  Diagnostic Studies: Xr Foot Complete Left  Result Date: 03/31/2019 3 view radiographs of the left foot show some lucency around the hardware but no significant destructive lytic changes no hardware failure.   Patient benefited maximally from their hospital stay and there were no complications.     Disposition: Discharge disposition: 01-Home or Self Care      Discharge Instructions    Call MD / Call 911   Complete by:  As directed    If you experience chest pain or shortness of breath, CALL 911 and be transported to the hospital emergency room.  If you develope a fever above 101 F, pus (white drainage) or increased drainage or redness at the wound, or calf pain, call your surgeon's office.   Constipation Prevention   Complete by:  As directed    Drink plenty of fluids.  Prune juice may be helpful.  You may use a stool softener, such as Colace (over the counter) 100 mg twice a day.  Use MiraLax (over the counter) for constipation as needed.   Diet - low sodium heart healthy   Complete by:  As directed    Increase activity slowly as tolerated   Complete by:  As directed      Follow-up Information    [facility].   Why:  [facility]       Newt Minion, MD. Schedule an appointment as soon as possible for a visit on 04/14/2019.   Specialty:  Orthopedic Surgery Contact information: [place] [address] [phone]            Signed: Ulysees Barns 04/08/2019, 7:16 AM [employer] [phone]

## 2019-04-10 ENCOUNTER — Telehealth (INDEPENDENT_AMBULATORY_CARE_PROVIDER_SITE_OTHER): Payer: Self-pay | Admitting: Orthopedic Surgery

## 2019-04-10 NOTE — Telephone Encounter (Signed)
VO was called in for patient. Brandon understood.

## 2019-04-10 NOTE — Telephone Encounter (Signed)
I called pt and she states that the socked she had the vac plugged into was not a good one and the vac was charging once moved to another wall. No problem to report pt will call back with any additional questions.

## 2019-04-10 NOTE — Telephone Encounter (Signed)
Katrina Wolfe with [facility] called.  He needs orders for Katrina Wolfe's occupational therapy: once a week x 4 weeks.  Call back # is [phone].

## 2019-04-10 NOTE — Telephone Encounter (Signed)
Patient having an issue with the wound vac. Please call ASAP. Patient's first post op visit is Monday April 27th.

## 2019-04-11 ENCOUNTER — Telehealth (INDEPENDENT_AMBULATORY_CARE_PROVIDER_SITE_OTHER): Payer: Self-pay

## 2019-04-11 NOTE — Telephone Encounter (Signed)
Patient called and advised that her wound vac was beeping. I then asked if the vac was plugged in and she stated she did have it in and it was on 4 lights and I informed her to turn off the vac and turn it back on and she stated it stopped beeping. If she has any more concerns she should call back since her appt is scheduled for Monday. Patient can come in today if needed.

## 2019-04-14 ENCOUNTER — Other Ambulatory Visit: Payer: Self-pay

## 2019-04-14 ENCOUNTER — Encounter (INDEPENDENT_AMBULATORY_CARE_PROVIDER_SITE_OTHER): Payer: Self-pay | Admitting: Orthopedic Surgery

## 2019-04-14 ENCOUNTER — Ambulatory Visit (INDEPENDENT_AMBULATORY_CARE_PROVIDER_SITE_OTHER): Payer: Medicare Other | Admitting: Orthopedic Surgery

## 2019-04-14 VITALS — Ht 69.0 in | Wt 265.0 lb

## 2019-04-14 DIAGNOSIS — E1142 Type 2 diabetes mellitus with diabetic polyneuropathy: Secondary | ICD-10-CM

## 2019-04-14 DIAGNOSIS — T847XXS Infection and inflammatory reaction due to other internal orthopedic prosthetic devices, implants and grafts, sequela: Secondary | ICD-10-CM

## 2019-04-14 DIAGNOSIS — M86272 Subacute osteomyelitis, left ankle and foot: Secondary | ICD-10-CM

## 2019-04-15 ENCOUNTER — Encounter (INDEPENDENT_AMBULATORY_CARE_PROVIDER_SITE_OTHER): Payer: Self-pay | Admitting: Orthopedic Surgery

## 2019-04-15 NOTE — Progress Notes (Signed)
Office Visit Note   Patient: Katrina Wolfe           Date of Birth: Jan 03, 1966           MRN: [id number] Visit Date: 04/14/2019              Requested by: Wenda Low, MD [address] [place] [address] [place], [place] [address] PCP: Wenda Low, MD  Chief Complaint  Patient presents with  . Left Foot - Routine Post Op    04/04/2019 left foot excision of bone and removal of HDW 5th MT      HPI: Patient is a 53 year old woman who presents status post removal of deep retained hardware secondary to infection with removal of the fibrous nonunion and placement of antibiotic beads.  Patient was discharged on a wound VAC.  She states the wound VAC has not been working for 2 days.  Assessment & Plan: Visit Diagnoses:  1. Hardware complicating wound infection, sequela   2. Diabetic polyneuropathy associated with type 2 diabetes mellitus ([place])     Plan: We will start washing the foot with soap and water and dry dressing changes daily follow-up in 1 week we will place her in a PRAFO and she is to be strict nonweightbearing on the left foot.  Discussed the risk of wound breakdown with weightbearing.  Follow-Up Instructions: Return in about 1 week (around 04/21/2019).   Ortho Exam  Patient is alert, oriented, no adenopathy, well-dressed, normal affect, normal respiratory effort. Examination there is significant maceration with exudative bloody fluid macerating the foot.  This was removed.  The wound has slight dehiscence with antibiotic beads leaving the wound from the beads that were packed in the fibrous nonunion of the fifth metatarsal.  Imaging: No results found. No images are attached to the encounter.  Labs: Lab Results  Component Value Date   REPTSTATUS 04/08/2019 FINAL 04/04/2019   GRAMSTAIN  04/04/2019    FEW WBC PRESENT,BOTH PMN AND MONONUCLEAR NO ORGANISMS SEEN    CULT  04/04/2019    FEW PSEUDOMONAS AERUGINOSA FEW SERRATIA MARCESCENS FEW STAPHYLOCOCCUS AUREUS  CRITICAL RESULT CALLED TO, READ BACK BY AND VERIFIED WITH: RN Suzan Garibaldi [phone] FCP NO ANAEROBES ISOLATED Performed at [facility], [place] [address] [place] [address], [place], [place] [address]    LABORGA PSEUDOMONAS AERUGINOSA 04/04/2019   LABORGA SERRATIA MARCESCENS 04/04/2019   LABORGA STAPHYLOCOCCUS AUREUS 04/04/2019     Lab Results  Component Value Date   ALBUMIN 3.9 03/01/2019   ALBUMIN 3.5 (L) 05/24/2017   ALBUMIN 4.2 11/23/2016    Body mass index is 39.13 kg/m.  Orders:  No orders of the defined types were placed in this encounter.  No orders of the defined types were placed in this encounter.    Procedures: No procedures performed  Clinical Data: No additional findings.  ROS:  All other systems negative, except as noted in the HPI. Review of Systems  Objective: Vital Signs: Ht 5\' 9"  (1.753 m)   Wt 265 lb (120.2 kg)   BMI 39.13 kg/m   Specialty Comments:  No specialty comments available.  PMFS History: Patient Active Problem List   Diagnosis Date Noted  . Osteomyelitis of foot ([place]) 04/04/2019  . Hardware complicating wound infection ([place])   . Subacute osteomyelitis, left ankle and foot ([place])   . Open displaced fracture of fifth metatarsal bone of left foot   . Healthcare maintenance 04/03/2019  . HIV disease ([place]) 10/27/2015  . DM type 2 (diabetes mellitus,  type 2) ([place]) 10/27/2015  . Hyperlipidemia 10/27/2015  . Essential hypertension 10/27/2015  . Severe vulvar dysplasia, histologically confirmed 10/15/2015   Past Medical History:  Diagnosis Date  . Abnormal uterine bleeding (AUB)   . Arthritis    knees, fingers  . Asthma    as child, no problems in 20 yrs  . Cancer ([place])   . CKD (chronic kidney disease), stage II   . Congenital cardiomegaly    per pt has always been told since childhood and siblings   . GERD (gastroesophageal reflux disease)   . History of genital warts   . HIV (human immunodeficiency virus infection) ([place])    [place]-- DR Carlyle Basques  . Hypertension   . Intermittent palpitations    mild-- no meds  . Legally blind in left eye, as defined in [place]    trauma as child  . Tuberculosis    ? 2005 or 2007  . Type 2 diabetes mellitus (HCC)    Type II   . Uterine fibroid   . VIN III (vulvar intraepithelial neoplasia III)    and Verrucoid lesion on the mons  . Wears glasses     Family History  Problem Relation Age of Onset  . Hypertension Mother   . Diabetes Father   . Cancer Brother   . Breast cancer Cousin     Past Surgical History:  Procedure Laterality Date  . CESAREAN SECTION  2001  &  1984  . CO2 LASER APPLICATION N/A 69/05/2951   Procedure: CO2 LASER APPLICATION AND WIDE VOCAL EXCSION OF LESION ON MONS PUBIS;  Surgeon: Marti Sleigh, MD;  Location: [facility];  Service: Gynecology;  Laterality: N/A;   CASE CANCELLED   . CO2 LASER APPLICATION N/A 8/41/3244   Procedure: CO2 LASER OF VULVAR;  Surgeon: Marti Sleigh, MD;  Location[facility];  Service: Gynecology;  Laterality: N/A;  . CORNEAL TRANSPLANT Left 2006   failed  . D & C HYSTEROSCOPY /  RESECTION FIBROID  2004  . HARDWARE REMOVAL Left 04/04/2019   Procedure: EXCISION BONE AND REMOVE DEEP HARDWARE LEFT 5TH METATARSAL;  Surgeon: Newt Minion, MD;  Location: [facility];  Service: Orthopedics;  Laterality: Left;  . KNEE ARTHROSCOPY W/ ACL RECONSTRUCTION Bilateral 2008  . ORIF TOE FRACTURE Left 08/02/2017   Procedure: OPEN REDUCTION INTERNAL FIXATION (ORIF) FIFTH METATARSAL (TOE) BASE FRACTURE;  Surgeon: Wylene Simmer, MD;  Location: [facility];  Service: Orthopedics;  Laterality: Left;  Marland Kitchen VULVECTOMY N/A 01/07/2016   Procedure: WIDE LOCAL EXCISION;  Surgeon: Marti Sleigh, MD;  Location: [facility];  Service: Gynecology;  Laterality: N/A;  mons pubis as site   Social History   Occupational History  . Not on file  Tobacco Use   . Smoking status: Never Smoker  . Smokeless tobacco: Never Used  Substance and Sexual Activity  . Alcohol use: No    Alcohol/week: 0.0 standard drinks  . Drug use: No  . Sexual activity: Yes    Partners: Male    Birth control/protection: None, Condom    Comment: declined condoms - has at home

## 2019-04-21 ENCOUNTER — Encounter: Payer: Self-pay | Admitting: Orthopedic Surgery

## 2019-04-21 ENCOUNTER — Ambulatory Visit (INDEPENDENT_AMBULATORY_CARE_PROVIDER_SITE_OTHER): Payer: Medicare Other | Admitting: Physician Assistant

## 2019-04-21 ENCOUNTER — Other Ambulatory Visit: Payer: Self-pay

## 2019-04-21 VITALS — Ht 69.0 in | Wt 265.0 lb

## 2019-04-21 DIAGNOSIS — E1142 Type 2 diabetes mellitus with diabetic polyneuropathy: Secondary | ICD-10-CM

## 2019-04-21 DIAGNOSIS — T847XXS Infection and inflammatory reaction due to other internal orthopedic prosthetic devices, implants and grafts, sequela: Secondary | ICD-10-CM

## 2019-04-21 DIAGNOSIS — E11610 Type 2 diabetes mellitus with diabetic neuropathic arthropathy: Secondary | ICD-10-CM

## 2019-04-21 NOTE — Progress Notes (Signed)
Office Visit Note   Patient: Katrina Wolfe           Date of Birth: June 01, 1966           MRN: [id number] Visit Date: 04/21/2019              Requested by: Wenda Low, MD [address] [place] [address] [place], [place] [address] PCP: Wenda Low, MD  Chief Complaint  Patient presents with  . Left Foot - Routine Post Op    04/04/2019 left foot excision bone and removal HDW 5th MT      HPI: The patient is a 53 yo woman who is seen for post operative follow up following removal of deep retained hardware secondray to infection of the left 5th metatarsal with removal of the fibrous nonunion and placement of antibiotic beads 04/04/2019.  Cultures grew Pseudomonas, Serratia and MSSA, all sensitive to Cipro.   Assessment & Plan: Visit Diagnoses:  1. Hardware complicating wound infection, sequela   2. Diabetic polyneuropathy associated with type 2 diabetes mellitus (HCC)   3. Charcot's arthropathy associated with type 2 diabetes mellitus ([place])     Plan: Begin Vive XL compression sock around the clock after daily washing of the area with soap and water. Continue PRAFO boot. Continue Cipro 500 mg BID for total of 30 days from DC from hospital. Follow up in 1 week.   Follow-Up Instructions: Return in about 1 week (around 04/28/2019).   Ortho Exam  Patient is alert, oriented, no adenopathy, well-dressed, normal affect, normal respiratory effort. The left lateral incision is moderately widened and sutures are under tension. Moderate edema with pink subcutaneous tissue exposed. No odor, moderate serous drainage and maceration over the inferior incision line. There is tenderness with gentle cleaning of the wound bed with gauze. Good palpable pedal pulse. Left calf 42 cm.   Imaging: No results found. No images are attached to the encounter.  Labs: Lab Results  Component Value Date   REPTSTATUS 04/08/2019 FINAL 04/04/2019   GRAMSTAIN  04/04/2019    FEW WBC PRESENT,BOTH PMN AND  MONONUCLEAR NO ORGANISMS SEEN    CULT  04/04/2019    FEW PSEUDOMONAS AERUGINOSA FEW SERRATIA MARCESCENS FEW STAPHYLOCOCCUS AUREUS CRITICAL RESULT CALLED TO, READ BACK BY AND VERIFIED WITH: RN Suzan Garibaldi [phone] FCP NO ANAEROBES ISOLATED Performed at [facility], [place] [address] [place], [place] [address]    LABORGA PSEUDOMONAS AERUGINOSA 04/04/2019   LABORGA SERRATIA MARCESCENS 04/04/2019   LABORGA STAPHYLOCOCCUS AUREUS 04/04/2019     Lab Results  Component Value Date   ALBUMIN 3.9 03/01/2019   ALBUMIN 3.5 (L) 05/24/2017   ALBUMIN 4.2 11/23/2016    Body mass index is 39.13 kg/m.  Orders:  No orders of the defined types were placed in this encounter.  No orders of the defined types were placed in this encounter.    Procedures: No procedures performed  Clinical Data: No additional findings.  ROS:  All other systems negative, except as noted in the HPI. Review of Systems  Objective: Vital Signs: Ht 5\' 9"  (1.753 m)   Wt 265 lb (120.2 kg)   BMI 39.13 kg/m   Specialty Comments:  No specialty comments available.  PMFS History: Patient Active Problem List   Diagnosis Date Noted  . Osteomyelitis of foot ([place]) 04/04/2019  . Hardware complicating wound infection ([place])   . Subacute osteomyelitis, left ankle and foot ([place])   . Open displaced fracture of fifth metatarsal bone of left foot   .  Healthcare maintenance 04/03/2019  . HIV disease ([place]) 10/27/2015  . DM type 2 (diabetes mellitus, type 2) ([place]) 10/27/2015  . Hyperlipidemia 10/27/2015  . Essential hypertension 10/27/2015  . Severe vulvar dysplasia, histologically confirmed 10/15/2015   Past Medical History:  Diagnosis Date  . Abnormal uterine bleeding (AUB)   . Arthritis    knees, fingers  . Asthma    as child, no problems in 20 yrs  . Cancer ([place])   . CKD (chronic kidney disease), stage II   . Congenital cardiomegaly    per pt has always been told since childhood and siblings   . GERD  (gastroesophageal reflux disease)   . History of genital warts   . HIV (human immunodeficiency virus infection) ([place])    [place]-- DR Carlyle Basques  . Hypertension   . Intermittent palpitations    mild-- no meds  . Legally blind in left eye, as defined in [place]    trauma as child  . Tuberculosis    ? 2005 or 2007  . Type 2 diabetes mellitus (HCC)    Type II   . Uterine fibroid   . VIN III (vulvar intraepithelial neoplasia III)    and Verrucoid lesion on the mons  . Wears glasses     Family History  Problem Relation Age of Onset  . Hypertension Mother   . Diabetes Father   . Cancer Brother   . Breast cancer Cousin     Past Surgical History:  Procedure Laterality Date  . CESAREAN SECTION  2001  &  1984  . CO2 LASER APPLICATION N/A 44/0/1027   Procedure: CO2 LASER APPLICATION AND WIDE VOCAL EXCSION OF LESION ON MONS PUBIS;  Surgeon: Marti Sleigh, MD;  Location: [facility];  Service: Gynecology;  Laterality: N/A;   CASE CANCELLED   . CO2 LASER APPLICATION N/A 2/53/6644   Procedure: CO2 LASER OF VULVAR;  Surgeon: Marti Sleigh, MD;  Location[facility];  Service: Gynecology;  Laterality: N/A;  . CORNEAL TRANSPLANT Left 2006   failed  . D & C HYSTEROSCOPY /  RESECTION FIBROID  2004  . HARDWARE REMOVAL Left 04/04/2019   Procedure: EXCISION BONE AND REMOVE DEEP HARDWARE LEFT 5TH METATARSAL;  Surgeon: Newt Minion, MD;  Location: [facility];  Service: Orthopedics;  Laterality: Left;  . KNEE ARTHROSCOPY W/ ACL RECONSTRUCTION Bilateral 2008  . ORIF TOE FRACTURE Left 08/02/2017   Procedure: OPEN REDUCTION INTERNAL FIXATION (ORIF) FIFTH METATARSAL (TOE) BASE FRACTURE;  Surgeon: Wylene Simmer, MD;  Location: [facility];  Service: Orthopedics;  Laterality: Left;  Marland Kitchen VULVECTOMY N/A 01/07/2016   Procedure: WIDE LOCAL EXCISION;  Surgeon: Marti Sleigh, MD;  Location: [facility];   Service: Gynecology;  Laterality: N/A;  mons pubis as site   Social History   Occupational History  . Not on file  Tobacco Use  . Smoking status: Never Smoker  . Smokeless tobacco: Never Used  Substance and Sexual Activity  . Alcohol use: No    Alcohol/week: 0.0 standard drinks  . Drug use: No  . Sexual activity: Yes    Partners: Male    Birth control/protection: None, Condom    Comment: declined condoms - has at home

## 2019-04-28 ENCOUNTER — Other Ambulatory Visit: Payer: Self-pay

## 2019-04-28 ENCOUNTER — Encounter: Payer: Self-pay | Admitting: Orthopedic Surgery

## 2019-04-28 ENCOUNTER — Ambulatory Visit (INDEPENDENT_AMBULATORY_CARE_PROVIDER_SITE_OTHER): Payer: Medicare Other | Admitting: Orthopedic Surgery

## 2019-04-28 VITALS — Ht 69.0 in | Wt 265.0 lb

## 2019-04-28 DIAGNOSIS — T847XXS Infection and inflammatory reaction due to other internal orthopedic prosthetic devices, implants and grafts, sequela: Secondary | ICD-10-CM

## 2019-04-28 DIAGNOSIS — E11610 Type 2 diabetes mellitus with diabetic neuropathic arthropathy: Secondary | ICD-10-CM

## 2019-04-28 DIAGNOSIS — E1142 Type 2 diabetes mellitus with diabetic polyneuropathy: Secondary | ICD-10-CM

## 2019-04-28 NOTE — Progress Notes (Signed)
Office Visit Note   Patient: Katrina Wolfe           Date of Birth: [birth year]           MRN: [id number] Visit Date: 04/28/2019              Requested by: Wenda Low, MD [address] [place], [place] [address] PCP: Wenda Low, MD  Chief Complaint  Patient presents with  . Left Foot - Routine Post Op    04/04/2019 left foot excision bone removal HDW 5th MT      HPI:  The patient is a 53 yo woman who is seen for post operative follow up following removal of deep retained hardware secondary to infectoin of the left 5th metatarsal with removal of fibrous non union and placement of antibiotics beads on 04/04/2019. Cultures grew Pseudomonas, Serratia and MSSA, all sensitive to Cipro and she remains on Cipro.  She has been using a dry dressing to the left foot and Ace wrap and diabetic socks as she has been unable to obtain medical compression socks. She is using a PRAFO boot.   Assessment & Plan: Visit Diagnoses:  1. Hardware complicating wound infection, sequela   2. Diabetic polyneuropathy associated with type 2 diabetes mellitus (HCC)   3. Charcot's arthropathy associated with type 2 diabetes mellitus ([place])     Plan: Continue daily washing of the left foot and dry dressing and Ace wrap and diabetic socks. Continue Cipro. Continue PRAFO boot. Follow up in 1 week.   Follow-Up Instructions: Return in about 1 week (around 05/05/2019).   Ortho Exam  Patient is alert, oriented, no adenopathy, well-dressed, normal affect, normal respiratory effort. The left foot incision looks improved. There is less edema and the sutures remain under some tension. Some yellow tissue over the wound bed with scant bloody drainage. Some maceration about the incisional area. No odor and scant drainage. Palpable pedal pulses.     Imaging: No results found. No images are attached to the encounter.  Labs: Lab Results  Component Value Date   REPTSTATUS 04/08/2019 FINAL 04/04/2019    GRAMSTAIN  04/04/2019    FEW WBC PRESENT,BOTH PMN AND MONONUCLEAR NO ORGANISMS SEEN    CULT  04/04/2019    FEW PSEUDOMONAS AERUGINOSA FEW SERRATIA MARCESCENS FEW STAPHYLOCOCCUS AUREUS CRITICAL RESULT CALLED TO, READ BACK BY AND VERIFIED WITH: RN Suzan Garibaldi [phone] FCP NO ANAEROBES ISOLATED Performed at [facility], [place] [address], [place], [place] [address]    LABORGA PSEUDOMONAS AERUGINOSA 04/04/2019   LABORGA SERRATIA MARCESCENS 04/04/2019   LABORGA STAPHYLOCOCCUS AUREUS 04/04/2019     Lab Results  Component Value Date   ALBUMIN 3.9 03/01/2019   ALBUMIN 3.5 (L) 05/24/2017   ALBUMIN 4.2 11/23/2016    Body mass index is 39.13 kg/m.  Orders:  No orders of the defined types were placed in this encounter.  No orders of the defined types were placed in this encounter.    Procedures: No procedures performed  Clinical Data: No additional findings.  ROS:  All other systems negative, except as noted in the HPI. Review of Systems  Objective: Vital Signs: Ht 5\' 9"  (1.753 m)   Wt 265 lb (120.2 kg)   BMI 39.13 kg/m   Specialty Comments:  No specialty comments available.  PMFS History: Patient Active Problem List   Diagnosis Date Noted  . Osteomyelitis of foot ([place]) 04/04/2019  . Hardware complicating wound infection ([place])   . Subacute osteomyelitis,  left ankle and foot ([place])   . Open displaced fracture of fifth metatarsal bone of left foot   . Healthcare maintenance 04/03/2019  . HIV disease ([place]) 10/27/2015  . DM type 2 (diabetes mellitus, type 2) ([place]) 10/27/2015  . Hyperlipidemia 10/27/2015  . Essential hypertension 10/27/2015  . Severe vulvar dysplasia, histologically confirmed 10/15/2015   Past Medical History:  Diagnosis Date  . Abnormal uterine bleeding (AUB)   . Arthritis    knees, fingers  . Asthma    as child, no problems in 20 yrs  . Cancer ([place])   . CKD (chronic kidney disease), stage II   . Congenital cardiomegaly    per pt  has always been told since childhood and siblings   . GERD (gastroesophageal reflux disease)   . History of genital warts   . HIV (human immunodeficiency virus infection) ([place])    [place]-- DR Carlyle Basques  . Hypertension   . Intermittent palpitations    mild-- no meds  . Legally blind in left eye, as defined in [place]    trauma as child  . Tuberculosis    ? 2005 or 2007  . Type 2 diabetes mellitus (HCC)    Type II   . Uterine fibroid   . VIN III (vulvar intraepithelial neoplasia III)    and Verrucoid lesion on the mons  . Wears glasses     Family History  Problem Relation Age of Onset  . Hypertension Mother   . Diabetes Father   . Cancer Brother   . Breast cancer Cousin     Past Surgical History:  Procedure Laterality Date  . CESAREAN SECTION  2001  &  1984  . CO2 LASER APPLICATION N/A 60/03/5408   Procedure: CO2 LASER APPLICATION AND WIDE VOCAL EXCSION OF LESION ON MONS PUBIS;  Surgeon: Marti Sleigh, MD;  Location: [facility];  Service: Gynecology;  Laterality: N/A;   CASE CANCELLED   . CO2 LASER APPLICATION N/A 07/28/9146   Procedure: CO2 LASER OF VULVAR;  Surgeon: Marti Sleigh, MD;  Location: [facility];  Service: Gynecology;  Laterality: N/A;  . CORNEAL TRANSPLANT Left 2006   failed  . D & C HYSTEROSCOPY /  RESECTION FIBROID  2004  . HARDWARE REMOVAL Left 04/04/2019   Procedure: EXCISION BONE AND REMOVE DEEP HARDWARE LEFT 5TH METATARSAL;  Surgeon: Newt Minion, MD;  Location: [facility];  Service: Orthopedics;  Laterality: Left;  . KNEE ARTHROSCOPY W/ ACL RECONSTRUCTION Bilateral 2008  . ORIF TOE FRACTURE Left 08/02/2017   Procedure: OPEN REDUCTION INTERNAL FIXATION (ORIF) FIFTH METATARSAL (TOE) BASE FRACTURE;  Surgeon: Wylene Simmer, MD;  Location: [facility];  Service: Orthopedics;  Laterality: Left;  Marland Kitchen VULVECTOMY N/A 01/07/2016   Procedure: WIDE LOCAL EXCISION;  Surgeon: Marti Sleigh, MD;  Location: [facility];  Service: Gynecology;  Laterality: N/A;  mons pubis as site   Social History   Occupational History  . Not on file  Tobacco Use  . Smoking status: Never Smoker  . Smokeless tobacco: Never Used  Substance and Sexual Activity  . Alcohol use: No    Alcohol/week: 0.0 standard drinks  . Drug use: No  . Sexual activity: Yes    Partners: Male    Birth control/protection: None, Condom    Comment: declined condoms - has at home

## 2019-05-08 ENCOUNTER — Encounter: Payer: Self-pay | Admitting: Orthopedic Surgery

## 2019-05-08 ENCOUNTER — Ambulatory Visit (INDEPENDENT_AMBULATORY_CARE_PROVIDER_SITE_OTHER): Payer: Medicare Other | Admitting: Orthopedic Surgery

## 2019-05-08 ENCOUNTER — Other Ambulatory Visit: Payer: Self-pay

## 2019-05-08 VITALS — Ht 69.0 in | Wt 265.0 lb

## 2019-05-08 DIAGNOSIS — E11610 Type 2 diabetes mellitus with diabetic neuropathic arthropathy: Secondary | ICD-10-CM

## 2019-05-08 DIAGNOSIS — T847XXS Infection and inflammatory reaction due to other internal orthopedic prosthetic devices, implants and grafts, sequela: Secondary | ICD-10-CM

## 2019-05-08 DIAGNOSIS — E1142 Type 2 diabetes mellitus with diabetic polyneuropathy: Secondary | ICD-10-CM

## 2019-05-08 MED ORDER — CIPROFLOXACIN HCL 500 MG PO TABS: 500.0000 mg | ORAL_TABLET | Freq: Two times a day (BID) | ORAL | 0 refills | Status: AC

## 2019-05-08 NOTE — Progress Notes (Signed)
Office Visit Note   Patient: Katrina Wolfe           Date of Birth: 1966-05-21           MRN: [id number] Visit Date: 05/08/2019              Requested by: Wenda Low, MD [place] [address] [place], [place] [address] PCP: Wenda Low, MD  Chief Complaint  Patient presents with  . Left Foot - Routine Post Op    04/04/19 left foot excision bone removal hardware 5th MT      HPI: The patient is a 53 yo woman who is seen for post operative follow up following removal of deep retained hardware secondary to infection of the left 5th metatrasal with removal of fibrous non union and placement of antibiotic beads on 04/04/2019. She has been wearing a medical compression sock directly over the foot and then gauze and Ace wrap to control the drainage. She feels the drainage has improved. She has been wearing a PRAFO and reports her heel is less painful.  Cultures grew Pseudomonas, Serratia and MSSA, all sensitive to Cipro and we will continue on Cipro.   Assessment & Plan: Visit Diagnoses:  1. Hardware complicating wound infection, sequela   2. Diabetic polyneuropathy associated with type 2 diabetes mellitus (HCC)   3. Charcot's arthropathy associated with type 2 diabetes mellitus ([place])     Plan:Continue daily washing and apply medical compression sock and then gauze and Ace wrapping as needed if still draining a lot. Continue PRAFO boot. Continue Cipro 500 mg BID   Follow-Up Instructions: Return in about 1 week (around 05/15/2019).   Ortho Exam  Patient is alert, oriented, no adenopathy, well-dressed, normal affect, normal respiratory effort. The wound improving with decreased drainage and swelling. The proximal and distal sutures were removed. There is no odor. She has palpable pedal pulses. No signs of cellulitis of the foot.     Imaging: No results found. No images are attached to the encounter.  Labs: Lab Results  Component Value Date   REPTSTATUS 04/08/2019  FINAL 04/04/2019   GRAMSTAIN  04/04/2019    FEW WBC PRESENT,BOTH PMN AND MONONUCLEAR NO ORGANISMS SEEN    CULT  04/04/2019    FEW PSEUDOMONAS AERUGINOSA FEW SERRATIA MARCESCENS FEW STAPHYLOCOCCUS AUREUS CRITICAL RESULT CALLED TO, READ BACK BY AND VERIFIED WITH: RN Suzan Garibaldi [phone] FCP NO ANAEROBES ISOLATED Performed at [facility], [place] [address], [place], [place] [address]    LABORGA PSEUDOMONAS AERUGINOSA 04/04/2019   LABORGA SERRATIA MARCESCENS 04/04/2019   LABORGA STAPHYLOCOCCUS AUREUS 04/04/2019     Lab Results  Component Value Date   ALBUMIN 3.9 03/01/2019   ALBUMIN 3.5 (L) 05/24/2017   ALBUMIN 4.2 11/23/2016    Body mass index is 39.13 kg/m.  Orders:  No orders of the defined types were placed in this encounter.  Meds ordered this encounter  Medications  . ciprofloxacin (CIPRO) 500 MG tablet    Sig: Take 1 tablet (500 mg total) by mouth 2 (two) times daily for 30 days.    Dispense:  60 tablet    Refill:  0     Procedures: No procedures performed  Clinical Data: No additional findings.  ROS:  All other systems negative, except as noted in the HPI. Review of Systems  Objective: Vital Signs: Ht 5\' 9"  (1.753 m)   Wt 265 lb (120.2 kg)   BMI 39.13 kg/m   Specialty Comments:  No  specialty comments available.  PMFS History: Patient Active Problem List   Diagnosis Date Noted  . Osteomyelitis of foot ([place]) 04/04/2019  . Hardware complicating wound infection ([place])   . Subacute osteomyelitis, left ankle and foot ([place])   . Open displaced fracture of fifth metatarsal bone of left foot   . Healthcare maintenance 04/03/2019  . HIV disease ([place]) 10/27/2015  . DM type 2 (diabetes mellitus, type 2) ([place]) 10/27/2015  . Hyperlipidemia 10/27/2015  . Essential hypertension 10/27/2015  . Severe vulvar dysplasia, histologically confirmed 10/15/2015   Past Medical History:  Diagnosis Date  . Abnormal uterine bleeding (AUB)   . Arthritis    knees,  fingers  . Asthma    as child, no problems in 20 yrs  . Cancer ([place])   . CKD (chronic kidney disease), stage II   . Congenital cardiomegaly    per pt has always been told since childhood and siblings   . GERD (gastroesophageal reflux disease)   . History of genital warts   . HIV (human immunodeficiency virus infection) ([place])    [place]-- DR Carlyle Basques  . Hypertension   . Intermittent palpitations    mild-- no meds  . Legally blind in left eye, as defined in [place]    trauma as child  . Tuberculosis    ? 2005 or 2007  . Type 2 diabetes mellitus (HCC)    Type II   . Uterine fibroid   . VIN III (vulvar intraepithelial neoplasia III)    and Verrucoid lesion on the mons  . Wears glasses     Family History  Problem Relation Age of On[place]  . Hypertension Mother   . Diabetes Father   . Cancer Brother   . Breast cancer Cousin     Past Surgical History:  Procedure Laterality Date  . CESAREAN SECTION  2001  &  1984  . CO2 LASER APPLICATION N/A 45/03/980   Procedure: CO2 LASER APPLICATION AND WIDE VOCAL EXCSION OF LESION ON MONS PUBIS;  Surgeon: Marti Sleigh, MD;  Location: [facility];  Service: Gynecology;  Laterality: N/A;   CASE CANCELLED   . CO2 LASER APPLICATION N/A 1/91/4782   Procedure: CO2 LASER OF VULVAR;  Surgeon: Marti Sleigh, MD;  Location[facility];  Service: Gynecology;  Laterality: N/A;  . CORNEAL TRANSPLANT Left 2006   failed  . D & C HYSTEROSCOPY /  RESECTION FIBROID  2004  . HARDWARE REMOVAL Left 04/04/2019   Procedure: EXCISION BONE AND REMOVE DEEP HARDWARE LEFT 5TH METATARSAL;  Surgeon: Newt Minion, MD;  Location: [facility];  Service: Orthopedics;  Laterality: Left;  . KNEE ARTHROSCOPY W/ ACL RECONSTRUCTION Bilateral 2008  . ORIF TOE FRACTURE Left 08/02/2017   Procedure: OPEN REDUCTION INTERNAL FIXATION (ORIF) FIFTH METATARSAL (TOE) BASE FRACTURE;  Surgeon: Wylene Simmer, MD;   Location: [facility];  Service: Orthopedics;  Laterality: Left;  Marland Kitchen VULVECTOMY N/A 01/07/2016   Procedure: WIDE LOCAL EXCISION;  Surgeon: Marti Sleigh, MD;  Location: [facility];  Service: Gynecology;  Laterality: N/A;  mons pubis as site   Social History   Occupational History  . Not on file  Tobacco Use  . Smoking status: Never Smoker  . Smokeless tobacco: Never Used  Substance and Sexual Activity  . Alcohol use: No    Alcohol/week: 0.0 standard drinks  . Drug use: No  . Sexual activity: Yes    Partners: Male  Birth control/protection: None, Condom    Comment: declined condoms - has at home

## 2019-05-18 ENCOUNTER — Telehealth: Payer: Self-pay | Admitting: *Deleted

## 2019-05-18 NOTE — Telephone Encounter (Signed)
Left message for patient to call [phone] to discuss possible labwork related to visit on 05/08/2019 at [facility] in [place].

## 2019-05-19 ENCOUNTER — Ambulatory Visit: Payer: Medicare Other | Admitting: Orthopedic Surgery

## 2019-05-19 ENCOUNTER — Telehealth: Payer: Self-pay

## 2019-05-19 DIAGNOSIS — Z20822 Contact with and (suspected) exposure to covid-19: Secondary | ICD-10-CM

## 2019-05-19 NOTE — Telephone Encounter (Signed)
Patient returned call, advised of the potential exposure to COVID-19 at the last OV on 05/08/19 and that free testing is being offered if agreeable. She says she doesn't have transportation. I advised if she decides to be tested and arranges transportation to call back to schedule to [phone] 7a-7p Monday through Friday, she verbalized understanding.

## 2019-05-19 NOTE — Telephone Encounter (Signed)
Account billing,  Returned call from  Patient stating she was contacted on Saturday 05/17/19 . She would like the Covid testing.  Provided a lab appointment for Tuesday May 20, 2019 at 10:00 am.   Patient voiced understanding.

## 2019-05-20 ENCOUNTER — Other Ambulatory Visit: Payer: Self-pay

## 2019-05-20 DIAGNOSIS — Z20822 Contact with and (suspected) exposure to covid-19: Secondary | ICD-10-CM

## 2019-05-21 LAB — NOVEL CORONAVIRUS, NAA: SARS-CoV-2, NAA: NOT DETECTED

## 2019-05-22 ENCOUNTER — Telehealth: Payer: Self-pay | Admitting: Orthopedic Surgery

## 2019-05-22 NOTE — Telephone Encounter (Signed)
[name] called stating she missed a call this morning regarding her testing results.  She said she was tested on Tuesday of this week. She called the phone number that was left for her to return to the call to. When she called that number, they told her that line was only for physicians.    She asks if someone could call her with test results and how to further proceed.  Thanks

## 2019-05-23 NOTE — Telephone Encounter (Signed)
Patient was called and informed that her results were not in at this time, she stated that she can call back on Monday.

## 2019-05-29 ENCOUNTER — Telehealth: Payer: Self-pay | Admitting: Orthopedic Surgery

## 2019-05-29 ENCOUNTER — Telehealth: Payer: Self-pay

## 2019-05-29 NOTE — Telephone Encounter (Signed)
Patient called and asks for her covid test results from 05/20/19. I advised there are no results in her chart and no record of an appointment or the test. I asked would she like to be scheduled again, she says she doesn't have transportation. I advised I will send a note to my manager to see if there is some way to find out about her testing. She says she had it done at [facility].

## 2019-05-29 NOTE — Telephone Encounter (Signed)
Patient called and stated that she took Covid test due to our outbreak and has heard nothing about her results.  She wants someone to call her and direct her on what she should do.  Please call patient at: [phone]

## 2019-05-30 NOTE — Telephone Encounter (Signed)
Patient was called and given the number to call he location for her testing results.

## 2019-06-13 ENCOUNTER — Ambulatory Visit: Payer: Self-pay | Admitting: Physician Assistant

## 2019-06-13 ENCOUNTER — Other Ambulatory Visit: Payer: Self-pay

## 2019-06-13 ENCOUNTER — Encounter: Payer: Self-pay | Admitting: Physician Assistant

## 2019-06-13 ENCOUNTER — Ambulatory Visit (INDEPENDENT_AMBULATORY_CARE_PROVIDER_SITE_OTHER): Payer: Medicare Other | Admitting: Physician Assistant

## 2019-06-13 VITALS — Ht 69.0 in | Wt 265.0 lb

## 2019-06-13 DIAGNOSIS — T847XXS Infection and inflammatory reaction due to other internal orthopedic prosthetic devices, implants and grafts, sequela: Secondary | ICD-10-CM

## 2019-06-13 DIAGNOSIS — E1142 Type 2 diabetes mellitus with diabetic polyneuropathy: Secondary | ICD-10-CM

## 2019-06-13 DIAGNOSIS — E11610 Type 2 diabetes mellitus with diabetic neuropathic arthropathy: Secondary | ICD-10-CM

## 2019-06-13 NOTE — Progress Notes (Signed)
Office Visit Note   Patient: Katrina Wolfe           Date of Birth: [birth year]           MRN: [id number] Visit Date: 06/13/2019              Requested by: Wenda Low, MD [place] [address] [place],  [place] [address] PCP: Wenda Low, MD  Chief Complaint  Patient presents with  . Left Foot - Routine Post Op    04/04/19 left foot excision of bone and removal of HDW 5th MT      HPI: The patient is a 53 year old woman who is seen for postoperative follow-up following removal of deep retained hardware secondary to infection of the left fifth metatarsal with removal of fibrous nonunion and placement of antibiotic beads on 04/04/2019.  She has been wearing a medical compression sock directly over the wound and it is gradually improving.  She did not wear the sock in today however and reports more swelling due to this.  She has been wearing a PRAFO boot and reports that this is helping. Her cultures had grown Pseudomonas Serratia and MSSA all sensitive to Cipro and she finished this last week.  She is applying antibiotic ointment to the open residual wound and then wearing her medical compression sock and PRAFO boot.  Assessment & Plan: Visit Diagnoses:  1. Hardware complicating wound infection, sequela   2. Diabetic polyneuropathy associated with type 2 diabetes mellitus (HCC)   3. Charcot's arthropathy associated with type 2 diabetes mellitus ([place])     Plan: 3 retained sutures were removed today.  She will continue some antibiotic ointment to the residual wound and then continue her medical compression sock daily around the clock except for showering.  Continue PRAFO boot.  Follow-up in 2 weeks.  Follow-Up Instructions: Return in about 2 weeks (around 06/27/2019).   Ortho Exam  Patient is alert, oriented, no adenopathy, well-dressed, normal affect, normal respiratory effort. The left lateral foot wound is gradually healing in.  There is moderate edema over the foot today  but she reports she did not wear her compression sock today and this is contributed to the edema.  There is pink granulation within the wound bed and there is scant drainage no odor no signs of cellulitis or infection of the foot.  She has palpable pedal pulses.  Imaging: No results found.   Labs: Lab Results  Component Value Date   REPTSTATUS 04/08/2019 FINAL 04/04/2019   GRAMSTAIN  04/04/2019    FEW WBC PRESENT,BOTH PMN AND MONONUCLEAR NO ORGANISMS SEEN    CULT  04/04/2019    FEW PSEUDOMONAS AERUGINOSA FEW SERRATIA MARCESCENS FEW STAPHYLOCOCCUS AUREUS CRITICAL RESULT CALLED TO, READ BACK BY AND VERIFIED WITH: RN Suzan Garibaldi [phone] FCP NO ANAEROBES ISOLATED Performed at [facility], [place] [address], [place], [place] [address]    LABORGA PSEUDOMONAS AERUGINOSA 04/04/2019   LABORGA SERRATIA MARCESCENS 04/04/2019   LABORGA STAPHYLOCOCCUS AUREUS 04/04/2019     Lab Results  Component Value Date   ALBUMIN 3.9 03/01/2019   ALBUMIN 3.5 (L) 05/24/2017   ALBUMIN 4.2 11/23/2016    Body mass index is 39.13 kg/m.  Orders:  No orders of the defined types were placed in this encounter.  No orders of the defined types were placed in this encounter.    Procedures: No procedures performed  Clinical Data: No additional findings.  ROS:  All other systems negative, except as noted in  the HPI. Review of Systems  Objective: Vital Signs: Ht 5\' 9"  (1.753 m)   Wt 265 lb (120.2 kg)   BMI 39.13 kg/m   Specialty Comments:  No specialty comments available.  PMFS History: Patient Active Problem List   Diagnosis Date Noted  . Osteomyelitis of foot ([place]) 04/04/2019  . Hardware complicating wound infection ([place])   . Subacute osteomyelitis, left ankle and foot ([place])   . Open displaced fracture of fifth metatarsal bone of left foot   . Healthcare maintenance 04/03/2019  . HIV disease ([place]) 10/27/2015  . DM type 2 (diabetes mellitus, type 2) ([place]) 10/27/2015  .  Hyperlipidemia 10/27/2015  . Essential hypertension 10/27/2015  . Severe vulvar dysplasia, histologically confirmed 10/15/2015   Past Medical History:  Diagnosis Date  . Abnormal uterine bleeding (AUB)   . Arthritis    knees, fingers  . Asthma    as child, no problems in 20 yrs  . Cancer ([place])   . CKD (chronic kidney disease), stage II   . Congenital cardiomegaly    per pt has always been told since childhood and siblings   . GERD (gastroesophageal reflux disease)   . History of genital warts   . HIV (human immunodeficiency virus infection) ([place])    [place]-- DR Carlyle Basques  . Hypertension   . Intermittent palpitations    mild-- no meds  . Legally blind in left eye, as defined in [place]    trauma as child  . Tuberculosis    ? 2005 or 2007  . Type 2 diabetes mellitus (HCC)    Type II   . Uterine fibroid   . VIN III (vulvar intraepithelial neoplasia III)    and Verrucoid lesion on the mons  . Wears glasses     Family History  Problem Relation Age of Onset  . Hypertension Mother   . Diabetes Father   . Cancer Brother   . Breast cancer Cousin     Past Surgical History:  Procedure Laterality Date  . CESAREAN SECTION  2001  &  1984  . CO2 LASER APPLICATION N/A 54/0/0867   Procedure: CO2 LASER APPLICATION AND WIDE VOCAL EXCSION OF LESION ON MONS PUBIS;  Surgeon: Marti Sleigh, MD;  Location: [facility];  Service: Gynecology;  Laterality: N/A;   CASE CANCELLED   . CO2 LASER APPLICATION N/A 06/05/5092   Procedure: CO2 LASER OF VULVAR;  Surgeon: Marti Sleigh, MD;  Location: [facility];  Service: Gynecology;  Laterality: N/A;  . CORNEAL TRANSPLANT Left 2006   failed  . D & C HYSTEROSCOPY /  RESECTION FIBROID  2004  . HARDWARE REMOVAL Left 04/04/2019   Procedure: EXCISION BONE AND REMOVE DEEP HARDWARE LEFT 5TH METATARSAL;  Surgeon: Newt Minion, MD;  Location: [facility];  Service: Orthopedics;   Laterality: Left;  . KNEE ARTHROSCOPY W/ ACL RECONSTRUCTION Bilateral 2008  . ORIF TOE FRACTURE Left 08/02/2017   Procedure: OPEN REDUCTION INTERNAL FIXATION (ORIF) FIFTH METATARSAL (TOE) BASE FRACTURE;  Surgeon: Wylene Simmer, MD;  Location: [facility];  Service: Orthopedics;  Laterality: Left;  Marland Kitchen VULVECTOMY N/A 01/07/2016   Procedure: WIDE LOCAL EXCISION;  Surgeon: Marti Sleigh, MD;  Location: [facility];  Service: Gynecology;  Laterality: N/A;  mons pubis as site   Social History   Occupational History  . Not on file  Tobacco Use  . Smoking status: Never Smoker  . Smokeless tobacco: Never Used  Substance and Sexual Activity  . Alcohol use: No    Alcohol/week: 0.0 standard drinks  . Drug use: No  . Sexual activity: Yes    Partners: Male    Birth control/protection: None, Condom    Comment: declined condoms - has at home

## 2019-07-04 ENCOUNTER — Ambulatory Visit (INDEPENDENT_AMBULATORY_CARE_PROVIDER_SITE_OTHER): Payer: Medicare Other | Admitting: Physician Assistant

## 2019-07-04 ENCOUNTER — Other Ambulatory Visit: Payer: Self-pay

## 2019-07-04 ENCOUNTER — Encounter: Payer: Self-pay | Admitting: Physician Assistant

## 2019-07-04 DIAGNOSIS — E1142 Type 2 diabetes mellitus with diabetic polyneuropathy: Secondary | ICD-10-CM

## 2019-07-04 DIAGNOSIS — T847XXS Infection and inflammatory reaction due to other internal orthopedic prosthetic devices, implants and grafts, sequela: Secondary | ICD-10-CM

## 2019-07-04 DIAGNOSIS — E11610 Type 2 diabetes mellitus with diabetic neuropathic arthropathy: Secondary | ICD-10-CM

## 2019-07-04 MED ORDER — CIPROFLOXACIN HCL 500 MG PO TABS: 500.0000 mg | ORAL_TABLET | Freq: Two times a day (BID) | ORAL | 0 refills | Status: DC

## 2019-07-04 NOTE — Progress Notes (Signed)
Office Visit Note   Patient: Katrina Wolfe           Date of Birth: 15-Feb-1966           MRN: [id number] Visit Date: 07/04/2019              Requested by: Wenda Low, MD [address] [place],  [place] [address] PCP: Wenda Low, MD  Chief Complaint  Patient presents with  . Left Foot - Follow-up      HPI: The patient is a 53 yo woman who is seen for postoperative follow-up following removal of deep retained hardware secondary to infection of the left fifth metatarsal with removal of a fibrous nonunion and placement of antibiotic beads on 04/04/2019.  She has been wearing a medical compression sock directly over the wound and utilizing a PRAFO boot for offloading.  She has severe Charcot arthropathy of this foot as well. She reports she has noted some odor from open area over the past couple of days.   Her operative cultures grew Pseudomonas, Serratia, MSSA which were all sensitive to Cipro and she completed a course of Cipro postoperatively.  Assessment & Plan: Visit Diagnoses:  1. Hardware complicating wound infection, sequela   2. Diabetic polyneuropathy associated with type 2 diabetes mellitus (HCC)   3. Charcot's arthropathy associated with type 2 diabetes mellitus ([place])     Plan: Will begin Cipro 500 mg BID. The area was debrided to healthy appearing tissue and hemostasis was achieved with silver nitrate. Continue to wash the foot daily with soap and water and apply medical compression sock directly over the wound.  She will continue to off load and elevate and use the [facility] boot. She will follow up in 2 weeks.   Follow-Up Instructions: Return in about 2 weeks (around 07/18/2019).   Ortho Exam  Patient is alert, oriented, no adenopathy, well-dressed, normal affect, normal respiratory effort. Left foot lateral wound is stable in overall size and has scant serous appearing drainage. Mild odor today. There is some epibole of the inferior wound border and  this area was debrided and and the wound bed debrided with a #10 blade knife to healthy bleeding tissue. Silver nitrate was used to achieve hemostasis. The wound was dressed with a dry dressing and then the medical compression sock and PRAFO boot was applied. She can resume wearing the medical compression sock directly over the wound tomorrow with her daily wash/dressing change.  Imaging: No results found. No images are attached to the encounter.  Labs: Lab Results  Component Value Date   REPTSTATUS 04/08/2019 FINAL 04/04/2019   GRAMSTAIN  04/04/2019    FEW WBC PRESENT,BOTH PMN AND MONONUCLEAR NO ORGANISMS SEEN    CULT  04/04/2019    FEW PSEUDOMONAS AERUGINOSA FEW SERRATIA MARCESCENS FEW STAPHYLOCOCCUS AUREUS CRITICAL RESULT CALLED TO, READ BACK BY AND VERIFIED WITH: RN Suzan Garibaldi [phone] FCP NO ANAEROBES ISOLATED Performed at [facility], [place] [address] [place], [place] [address]    LABORGA PSEUDOMONAS AERUGINOSA 04/04/2019   LABORGA SERRATIA MARCESCENS 04/04/2019   LABORGA STAPHYLOCOCCUS AUREUS 04/04/2019     Lab Results  Component Value Date   ALBUMIN 3.9 03/01/2019   ALBUMIN 3.5 (L) 05/24/2017   ALBUMIN 4.2 11/23/2016    No results found for: MG No results found for: VD25OH  No results found for: PREALBUMIN CBC EXTENDED Latest Ref Rng & Units 04/04/2019 03/20/2019 03/01/2019  WBC 4.0 - 10.5 K/uL 9.7 10.8 16.3(H)  RBC  3.87 - 5.11 MIL/uL 3.54(L) 3.78(L) 3.74(L)  HGB 12.0 - 15.0 g/dL 10.6(L) 11.8 12.0  HCT 36.0 - 46.0 % 34.0(L) 34.8(L) 37.2  PLT 150 - 400 K/uL 412(H) 433(H) 373  NEUTROABS 1,500 - 7,800 cells/uL - 7,420 12.9(H)  LYMPHSABS 850 - 3,900 cells/uL - 2,495 2.2     There is no height or weight on file to calculate BMI.  Orders:  No orders of the defined types were placed in this encounter.  Meds ordered this encounter  Medications  . ciprofloxacin (CIPRO) 500 MG tablet    Sig: Take 1 tablet (500 mg total) by mouth 2 (two) times daily.     Dispense:  28 tablet    Refill:  0     Procedures: No procedures performed  Clinical Data: No additional findings.  ROS:  All other systems negative, except as noted in the HPI. Review of Systems  Objective: Vital Signs: There were no vitals taken for this visit.  Specialty Comments:  No specialty comments available.  PMFS History: Patient Active Problem List   Diagnosis Date Noted  . Osteomyelitis of foot ([place]) 04/04/2019  . Hardware complicating wound infection ([place])   . Subacute osteomyelitis, left ankle and foot ([place])   . Open displaced fracture of fifth metatarsal bone of left foot   . Healthcare maintenance 04/03/2019  . HIV disease ([place]) 10/27/2015  . DM type 2 (diabetes mellitus, type 2) ([place]) 10/27/2015  . Hyperlipidemia 10/27/2015  . Essential hypertension 10/27/2015  . Severe vulvar dysplasia, histologically confirmed 10/15/2015   Past Medical History:  Diagnosis Date  . Abnormal uterine bleeding (AUB)   . Arthritis    knees, fingers  . Asthma    as child, no problems in 20 yrs  . Cancer ([place])   . CKD (chronic kidney disease), stage II   . Congenital cardiomegaly    per pt has always been told since childhood and siblings   . GERD (gastroesophageal reflux disease)   . History of genital warts   . HIV (human immunodeficiency virus infection) ([place])    [place]-- DR Carlyle Basques  . Hypertension   . Intermittent palpitations    mild-- no meds  . Legally blind in left eye, as defined in [place]    trauma as child  . Tuberculosis    ? 2005 or 2007  . Type 2 diabetes mellitus (HCC)    Type II   . Uterine fibroid   . VIN III (vulvar intraepithelial neoplasia III)    and Verrucoid lesion on the mons  . Wears glasses     Family History  Problem Relation Age of Onset  . Hypertension Mother   . Diabetes Father   . Cancer Brother   . Breast cancer Cousin     Past Surgical History:  Procedure Laterality Date  .  CESAREAN SECTION  2001  &  1984  . CO2 LASER APPLICATION N/A 19/05/2228   Procedure: CO2 LASER APPLICATION AND WIDE VOCAL EXCSION OF LESION ON MONS PUBIS;  Surgeon: Marti Sleigh, MD;  Location: [place];  Service: Gynecology;  Laterality: N/A;   CASE CANCELLED   . CO2 LASER APPLICATION N/A 7/98/9211   Procedure: CO2 LASER OF VULVAR;  Surgeon: Marti Sleigh, MD;  Location: [facility];  Service: Gynecology;  Laterality: N/A;  . CORNEAL TRANSPLANT Left 2006   failed  . D & C HYSTEROSCOPY /  RESECTION FIBROID  2004  . HARDWARE  REMOVAL Left 04/04/2019   Procedure: EXCISION BONE AND REMOVE DEEP HARDWARE LEFT 5TH METATARSAL;  Surgeon: Newt Minion, MD;  Location: [place];  Service: Orthopedics;  Laterality: Left;  . KNEE ARTHROSCOPY W/ ACL RECONSTRUCTION Bilateral 2008  . ORIF TOE FRACTURE Left 08/02/2017   Procedure: OPEN REDUCTION INTERNAL FIXATION (ORIF) FIFTH METATARSAL (TOE) BASE FRACTURE;  Surgeon: Wylene Simmer, MD;  Location: [place];  Service: Orthopedics;  Laterality: Left;  Marland Kitchen VULVECTOMY N/A 01/07/2016   Procedure: WIDE LOCAL EXCISION;  Surgeon: Marti Sleigh, MD;  Location: [facility];  Service: Gynecology;  Laterality: N/A;  mons pubis as site   Social History   Occupational History  . Not on file  Tobacco Use  . Smoking status: Never Smoker  . Smokeless tobacco: Never Used  Substance and Sexual Activity  . Alcohol use: No    Alcohol/week: 0.0 standard drinks  . Drug use: No  . Sexual activity: Yes    Partners: Male    Birth control/protection: None, Condom    Comment: declined condoms - has at home

## 2019-07-17 ENCOUNTER — Ambulatory Visit (INDEPENDENT_AMBULATORY_CARE_PROVIDER_SITE_OTHER): Payer: Medicare Other | Admitting: Physician Assistant

## 2019-07-17 ENCOUNTER — Encounter: Payer: Self-pay | Admitting: Orthopedic Surgery

## 2019-07-17 ENCOUNTER — Other Ambulatory Visit: Payer: Self-pay

## 2019-07-17 VITALS — Ht 69.0 in | Wt 265.0 lb

## 2019-07-17 DIAGNOSIS — E11610 Type 2 diabetes mellitus with diabetic neuropathic arthropathy: Secondary | ICD-10-CM

## 2019-07-17 DIAGNOSIS — E1142 Type 2 diabetes mellitus with diabetic polyneuropathy: Secondary | ICD-10-CM

## 2019-07-17 DIAGNOSIS — Z794 Long term (current) use of insulin: Secondary | ICD-10-CM

## 2019-07-17 DIAGNOSIS — T847XXS Infection and inflammatory reaction due to other internal orthopedic prosthetic devices, implants and grafts, sequela: Secondary | ICD-10-CM

## 2019-07-17 DIAGNOSIS — L97522 Non-pressure chronic ulcer of other part of left foot with fat layer exposed: Secondary | ICD-10-CM

## 2019-07-17 NOTE — Progress Notes (Signed)
Office Visit Note   Patient: Katrina Wolfe           Date of Birth: 03-Dec-1966           MRN: [id number] Visit Date: 07/17/2019              Requested by: Wenda Low, MD [address] [place],  [place] [address] PCP: Wenda Low, MD  Chief Complaint  Patient presents with  . Left Foot - Routine Post Op    04/04/19 left excision bone and removal of deep HDW left 5th MT      HPI: The patient is a 53 yo woman who is seen for follow up of left foot with removal of deep retained hardware secondary to infection of the left fifth metatarsal with removal of a fibrous nonunion and placement of antibiotic beads on 04/04/2019.  She has severe Charcot arthropathy and has been wearing a medical compression sock and using a PRAFO boot. The area is slowly healing.  On Cipro for past couple of weeks and has at least another week left.  Her operative cultures grew Pseudomonas, Serratia, MSSA which were all sensitive to Cipro and she completed a course of Cipro postoperatively. Assessment & Plan: Visit Diagnoses:  1. Ulcer of left foot, with fat layer exposed ([place])   2. Hardware complicating wound infection, sequela   3. Charcot's arthropathy associated with type 2 diabetes mellitus ([place])   4. Diabetic polyneuropathy associated with type 2 diabetes mellitus ([place])   5. Type 2 diabetes mellitus with diabetic polyneuropathy, with long-term current use of insulin (HCC)     Plan: The necrotic, fibrous tissue was debrided from the wound bed with a #10 Blade knife and silver nitrate was used for hemostasis and to treat hypergranulation over the wound bed. Continue to Wash the foot daily with soap and water and apply Vive medical compression sock directly in contact with skin over residual wound. Continue PRAFO boot.  Continue Cipro Follow up in 2 weeks.   Follow-Up Instructions: Return in about 2 weeks (around 07/31/2019).   Ortho Exam  Patient is alert, oriented, no adenopathy,  well-dressed, normal affect, normal respiratory effort. Left 5th ray amputation site with residual wound ~ 1.5 x 3 x 0.1 cm without signs of infection or cellulitis of the foot. There is hypergranulation over the wound and this was treated with silver nitrate. A #10 Blade knife was used to debride necrotic fibrous tissue from the area as well . Silver nitrate was used for hemostasis. iodosorb ointment and bandaid applied to the area and then Vive medical compression sock applied. No odor, scant drainage today. No signs of cellulitis of the foot today.   Imaging: No results found. No images are attached to the encounter.  Labs: Lab Results  Component Value Date   REPTSTATUS 04/08/2019 FINAL 04/04/2019   GRAMSTAIN  04/04/2019    FEW WBC PRESENT,BOTH PMN AND MONONUCLEAR NO ORGANISMS SEEN    CULT  04/04/2019    FEW PSEUDOMONAS AERUGINOSA FEW SERRATIA MARCESCENS FEW STAPHYLOCOCCUS AUREUS CRITICAL RESULT CALLED TO, READ BACK BY AND VERIFIED WITH: RN Suzan Garibaldi [phone] FCP NO ANAEROBES ISOLATED Performed at [facility], [place] [address], [place], [place] [address]    LABORGA PSEUDOMONAS AERUGINOSA 04/04/2019   LABORGA SERRATIA MARCESCENS 04/04/2019   LABORGA STAPHYLOCOCCUS AUREUS 04/04/2019     Lab Results  Component Value Date   ALBUMIN 3.9 03/01/2019   ALBUMIN 3.5 (L) 05/24/2017   ALBUMIN 4.2  11/23/2016    No results found for: MG No results found for: VD25OH  No results found for: PREALBUMIN CBC EXTENDED Latest Ref Rng & Units 04/04/2019 03/20/2019 03/01/2019  WBC 4.0 - 10.5 K/uL 9.7 10.8 16.3(H)  RBC 3.87 - 5.11 MIL/uL 3.54(L) 3.78(L) 3.74(L)  HGB 12.0 - 15.0 g/dL 10.6(L) 11.8 12.0  HCT 36.0 - 46.0 % 34.0(L) 34.8(L) 37.2  PLT 150 - 400 K/uL 412(H) 433(H) 373  NEUTROABS 1,500 - 7,800 cells/uL - 7,420 12.9(H)  LYMPHSABS 850 - 3,900 cells/uL - 2,495 2.2     Body mass index is 39.13 kg/m.  Orders:  No orders of the defined types were placed in this encounter.  No  orders of the defined types were placed in this encounter.    Procedures: No procedures performed  Clinical Data: No additional findings.  ROS:  All other systems negative, except as noted in the HPI. Review of Systems  Objective: Vital Signs: Ht 5\' 9"  (1.753 m)   Wt 265 lb (120.2 kg)   BMI 39.13 kg/m   Specialty Comments:  No specialty comments available.  PMFS History: Patient Active Problem List   Diagnosis Date Noted  . Osteomyelitis of foot ([place]) 04/04/2019  . Hardware complicating wound infection ([place])   . Subacute osteomyelitis, left ankle and foot ([place])   . Open displaced fracture of fifth metatarsal bone of left foot   . Healthcare maintenance 04/03/2019  . HIV disease ([place]) 10/27/2015  . DM type 2 (diabetes mellitus, type 2) ([place]) 10/27/2015  . Hyperlipidemia 10/27/2015  . Essential hypertension 10/27/2015  . Severe vulvar dysplasia, histologically confirmed 10/15/2015   Past Medical History:  Diagnosis Date  . Abnormal uterine bleeding (AUB)   . Arthritis    knees, fingers  . Asthma    as child, no problems in 20 yrs  . Cancer ([place])   . CKD (chronic kidney disease), stage II   . Congenital cardiomegaly    per pt has always been told since childhood and siblings   . GERD (gastroesophageal reflux disease)   . History of genital warts   . HIV (human immunodeficiency virus infection) ([place])    [place]-- DR Carlyle Basques  . Hypertension   . Intermittent palpitations    mild-- no meds  . Legally blind in left eye, as defined in [place]    trauma as child  . Tuberculosis    ? 2005 or 2007  . Type 2 diabetes mellitus (HCC)    Type II   . Uterine fibroid   . VIN III (vulvar intraepithelial neoplasia III)    and Verrucoid lesion on the mons  . Wears glasses     Family History  Problem Relation Age of Onset  . Hypertension Mother   . Diabetes Father   . Cancer Brother   . Breast cancer Cousin     Past Surgical  History:  Procedure Laterality Date  . CESAREAN SECTION  2001  &  1984  . CO2 LASER APPLICATION N/A 08/25/1190   Procedure: CO2 LASER APPLICATION AND WIDE VOCAL EXCSION OF LESION ON MONS PUBIS;  Surgeon: Marti Sleigh, MD;  Location: [place];  Service: Gynecology;  Laterality: N/A;   CASE CANCELLED   . CO2 LASER APPLICATION N/A 4/78/2956   Procedure: CO2 LASER OF VULVAR;  Surgeon: Marti Sleigh, MD;  Location[facility];  Service: Gynecology;  Laterality: N/A;  . CORNEAL TRANSPLANT Left 2006   failed  .  D & C HYSTEROSCOPY /  RESECTION FIBROID  2004  . HARDWARE REMOVAL Left 04/04/2019   Procedure: EXCISION BONE AND REMOVE DEEP HARDWARE LEFT 5TH METATARSAL;  Surgeon: Newt Minion, MD;  Location: [place];  Service: Orthopedics;  Laterality: Left;  . KNEE ARTHROSCOPY W/ ACL RECONSTRUCTION Bilateral 2008  . ORIF TOE FRACTURE Left 08/02/2017   Procedure: OPEN REDUCTION INTERNAL FIXATION (ORIF) FIFTH METATARSAL (TOE) BASE FRACTURE;  Surgeon: Wylene Simmer, MD;  Location: [place];  Service: Orthopedics;  Laterality: Left;  Marland Kitchen VULVECTOMY N/A 01/07/2016   Procedure: WIDE LOCAL EXCISION;  Surgeon: Marti Sleigh, MD;  Location: [facility];  Service: Gynecology;  Laterality: N/A;  mons pubis as site   Social History   Occupational History  . Not on file  Tobacco Use  . Smoking status: Never Smoker  . Smokeless tobacco: Never Used  Substance and Sexual Activity  . Alcohol use: No    Alcohol/week: 0.0 standard drinks  . Drug use: No  . Sexual activity: Yes    Partners: Male    Birth control/protection: None, Condom    Comment: declined condoms - has at home

## 2019-08-04 ENCOUNTER — Other Ambulatory Visit: Payer: Medicare Other

## 2019-08-04 ENCOUNTER — Other Ambulatory Visit: Payer: Self-pay

## 2019-08-04 ENCOUNTER — Ambulatory Visit: Payer: Medicare Other | Admitting: Orthopedic Surgery

## 2019-08-04 DIAGNOSIS — B20 Human immunodeficiency virus [HIV] disease: Secondary | ICD-10-CM

## 2019-08-05 ENCOUNTER — Other Ambulatory Visit: Payer: Self-pay | Admitting: Obstetrics and Gynecology

## 2019-08-05 LAB — T-HELPER CELL (CD4) - (RCID CLINIC ONLY)
CD4 % Helper T Cell: 24 % — ABNORMAL LOW (ref 33–65)
CD4 T Cell Abs: 605 /uL (ref 400–1790)

## 2019-08-07 ENCOUNTER — Ambulatory Visit (INDEPENDENT_AMBULATORY_CARE_PROVIDER_SITE_OTHER): Payer: Medicare Other | Admitting: Orthopedic Surgery

## 2019-08-07 ENCOUNTER — Other Ambulatory Visit: Payer: Self-pay

## 2019-08-07 ENCOUNTER — Encounter: Payer: Self-pay | Admitting: Orthopedic Surgery

## 2019-08-07 VITALS — Ht 69.0 in | Wt 265.0 lb

## 2019-08-07 DIAGNOSIS — L97522 Non-pressure chronic ulcer of other part of left foot with fat layer exposed: Secondary | ICD-10-CM

## 2019-08-07 DIAGNOSIS — T847XXS Infection and inflammatory reaction due to other internal orthopedic prosthetic devices, implants and grafts, sequela: Secondary | ICD-10-CM | POA: Diagnosis not present

## 2019-08-07 LAB — COMPREHENSIVE METABOLIC PANEL
AG Ratio: 1.1 (calc) (ref 1.0–2.5)
ALT: 13 U/L (ref 6–29)
AST: 15 U/L (ref 10–35)
Albumin: 3.7 g/dL (ref 3.6–5.1)
Alkaline phosphatase (APISO): 78 U/L (ref 37–153)
BUN: 15 mg/dL (ref 7–25)
CO2: 28 mmol/L (ref 20–32)
Calcium: 9.4 mg/dL (ref 8.6–10.4)
Chloride: 104 mmol/L (ref 98–110)
Creat: 0.94 mg/dL (ref 0.50–1.05)
Globulin: 3.5 g/dL (calc) (ref 1.9–3.7)
Glucose, Bld: 188 mg/dL — ABNORMAL HIGH (ref 65–99)
Potassium: 4.6 mmol/L (ref 3.5–5.3)
Sodium: 139 mmol/L (ref 135–146)
Total Bilirubin: 0.2 mg/dL (ref 0.2–1.2)
Total Protein: 7.2 g/dL (ref 6.1–8.1)

## 2019-08-07 LAB — HIV-1 RNA QUANT-NO REFLEX-BLD
HIV 1 RNA Quant: 37 copies/mL — ABNORMAL HIGH
HIV-1 RNA Quant, Log: 1.57 Log copies/mL — ABNORMAL HIGH

## 2019-08-10 ENCOUNTER — Encounter: Payer: Self-pay | Admitting: Orthopedic Surgery

## 2019-08-10 NOTE — Progress Notes (Signed)
Office Visit Note   Patient: Katrina Wolfe           Date of Birth: Sep 30, 1966           MRN: [id number] Visit Date: 08/07/2019              Requested by: Wenda Low, MD [address] [place],  [place] [address] PCP: Wenda Low, MD  Chief Complaint  Patient presents with  . Left Foot - Routine Post Op    04/04/19 left foot excision bone and removal of dep HDW 5th MT      HPI: Patient is a 53 year old woman who is 4 months status post excision of bone removal deep retained hardware of left foot.  She is currently in a brace she has completed her antibiotics.  She states that there are no open areas denies any odor or drainage she states she feels well and has no concerns.  Assessment & Plan: Visit Diagnoses: No diagnosis found.  Plan: Recommended continue with her posterior ankle-foot orthosis continue with antibiotic ointment and dry dressing reevaluate in 4 weeks.  Follow-Up Instructions: No follow-ups on file.   Ortho Exam  Patient is alert, oriented, no adenopathy, well-dressed, normal affect, normal respiratory effort. Examination patient has equinovarus collapse of her ankle.  The ulcer has 100% healthy granulation tissue is 2 cm in diameter 1 mm deep she has a good dorsalis pedis pulse there is no cellulitis no signs of infection.  Imaging: No results found. No images are attached to the encounter.  Labs: Lab Results  Component Value Date   REPTSTATUS 04/08/2019 FINAL 04/04/2019   GRAMSTAIN  04/04/2019    FEW WBC PRESENT,BOTH PMN AND MONONUCLEAR NO ORGANISMS SEEN    CULT  04/04/2019    FEW PSEUDOMONAS AERUGINOSA FEW SERRATIA MARCESCENS FEW STAPHYLOCOCCUS AUREUS CRITICAL RESULT CALLED TO, READ BACK BY AND VERIFIED WITH: RN Suzan Garibaldi [phone] FCP NO ANAEROBES ISOLATED Performed at [facility], [place] [address], [place], [place] [address]    LABORGA PSEUDOMONAS AERUGINOSA 04/04/2019   LABORGA SERRATIA MARCESCENS 04/04/2019   LABORGA STAPHYLOCOCCUS AUREUS 04/04/2019     Lab Results  Component Value Date   ALBUMIN 3.9 03/01/2019   ALBUMIN 3.5 (L) 05/24/2017   ALBUMIN 4.2 11/23/2016    No results found for: MG No results found for: VD25OH  No results found for: PREALBUMIN CBC EXTENDED Latest Ref Rng & Units 04/04/2019 03/20/2019 03/01/2019  WBC 4.0 - 10.5 K/uL 9.7 10.8 16.3(H)  RBC 3.87 - 5.11 MIL/uL 3.54(L) 3.78(L) 3.74(L)  HGB 12.0 - 15.0 g/dL 10.6(L) 11.8 12.0  HCT 36.0 - 46.0 % 34.0(L) 34.8(L) 37.2  PLT 150 - 400 K/uL 412(H) 433(H) 373  NEUTROABS 1,500 - 7,800 cells/uL - 7,420 12.9(H)  LYMPHSABS 850 - 3,900 cells/uL - 2,495 2.2     Body mass index is 39.13 kg/m.  Orders:  No orders of the defined types were placed in this encounter.  No orders of the defined types were placed in this encounter.    Procedures: No procedures performed  Clinical Data: No additional findings.  ROS:  All other systems negative, except as noted in the HPI. Review of Systems  Objective: Vital Signs: Ht 5\' 9"  (1.753 m)   Wt 265 lb (120.2 kg)   BMI 39.13 kg/m   Specialty Comments:  No specialty comments available.  PMFS History: Patient Active Problem List   Diagnosis Date Noted  . Osteomyelitis of foot ([place]) 04/04/2019  .  Hardware complicating wound infection ([place])   . Subacute osteomyelitis, left ankle and foot ([place])   . Open displaced fracture of fifth metatarsal bone of left foot   . Healthcare maintenance 04/03/2019  . HIV disease ([place]) 10/27/2015  . DM type 2 (diabetes mellitus, type 2) ([place]) 10/27/2015  . Hyperlipidemia 10/27/2015  . Essential hypertension 10/27/2015  . Severe vulvar dysplasia, histologically confirmed 10/15/2015   Past Medical History:  Diagnosis Date  . Abnormal uterine bleeding (AUB)   . Arthritis    knees, fingers  . Asthma    as child, no problems in 20 yrs  . Cancer ([place])   . CKD (chronic kidney disease), stage II   . Congenital cardiomegaly    per pt has  always been told since childhood and siblings   . GERD (gastroesophageal reflux disease)   . History of genital warts   . HIV (human immunodeficiency virus infection) ([place])    [place]-- DR Carlyle Basques  . Hypertension   . Intermittent palpitations    mild-- no meds  . Legally blind in left eye, as defined in [place]    trauma as child  . Tuberculosis    ? 2005 or 2007  . Type 2 diabetes mellitus (HCC)    Type II   . Uterine fibroid   . VIN III (vulvar intraepithelial neoplasia III)    and Verrucoid lesion on the mons  . Wears glasses     Family History  Problem Relation Age of Onset  . Hypertension Mother   . Diabetes Father   . Cancer Brother   . Breast cancer Cousin     Past Surgical History:  Procedure Laterality Date  . CESAREAN SECTION  2001  &  1984  . CO2 LASER APPLICATION N/A 58/07/5026   Procedure: CO2 LASER APPLICATION AND WIDE VOCAL EXCSION OF LESION ON MONS PUBIS;  Surgeon: Marti Sleigh, MD;  Location: [place];  Service: Gynecology;  Laterality: N/A;   CASE CANCELLED   . CO2 LASER APPLICATION N/A 7/41/2878   Procedure: CO2 LASER OF VULVAR;  Surgeon: Marti Sleigh, MD;  [facility];  Service: Gynecology;  Laterality: N/A;  . CORNEAL TRANSPLANT Left 2006   failed  . D & C HYSTEROSCOPY /  RESECTION FIBROID  2004  . HARDWARE REMOVAL Left 04/04/2019   Procedure: EXCISION BONE AND REMOVE DEEP HARDWARE LEFT 5TH METATARSAL;  Surgeon: Newt Minion, MD;  Location: [place];  Service: Orthopedics;  Laterality: Left;  . KNEE ARTHROSCOPY W/ ACL RECONSTRUCTION Bilateral 2008  . ORIF TOE FRACTURE Left 08/02/2017   Procedure: OPEN REDUCTION INTERNAL FIXATION (ORIF) FIFTH METATARSAL (TOE) BASE FRACTURE;  Surgeon: Wylene Simmer, MD;  Location: [place];  Service: Orthopedics;  Laterality: Left;  Marland Kitchen VULVECTOMY N/A 01/07/2016   Procedure: WIDE LOCAL EXCISION;  Surgeon: Marti Sleigh, MD;  Location: [facility];  Service: Gynecology;  Laterality: N/A;  mons pubis as site   Social History   Occupational History  . Not on file  Tobacco Use  . Smoking status: Never Smoker  . Smokeless tobacco: Never Used  Substance and Sexual Activity  . Alcohol use: No    Alcohol/week: 0.0 standard drinks  . Drug use: No  . Sexual activity: Yes    Partners: Male    Birth control/protection: None, Condom    Comment: declined condoms - has at home

## 2019-08-14 ENCOUNTER — Telehealth: Payer: Self-pay

## 2019-08-14 NOTE — Telephone Encounter (Signed)
COVID-19 Pre-Screening Questions:08/14/19  Do you currently have a fever (>100 F), chills or unexplained body aches? NO  Are you currently experiencing new cough, shortness of breath, sore throat, runny nose? NO  .  Have you recently travelled outside the state of [place] in the last 14 days? NO  .  Have you been in contact with someone that is currently pending confirmation of Covid19 testing or has been confirmed to have the [place] virus?  NO   **If the patient answers NO to ALL questions -  advise the patient to please call the clinic before coming to the office should any symptoms develop.

## 2019-08-18 ENCOUNTER — Ambulatory Visit (INDEPENDENT_AMBULATORY_CARE_PROVIDER_SITE_OTHER): Payer: Medicare Other | Admitting: Internal Medicine

## 2019-08-18 ENCOUNTER — Other Ambulatory Visit: Payer: Self-pay

## 2019-08-18 ENCOUNTER — Encounter: Payer: Self-pay | Admitting: Internal Medicine

## 2019-08-18 VITALS — BP 138/82 | HR 75 | Temp 98.1°F

## 2019-08-18 DIAGNOSIS — E11610 Type 2 diabetes mellitus with diabetic neuropathic arthropathy: Secondary | ICD-10-CM | POA: Diagnosis not present

## 2019-08-18 DIAGNOSIS — B20 Human immunodeficiency virus [HIV] disease: Secondary | ICD-10-CM | POA: Diagnosis not present

## 2019-08-18 DIAGNOSIS — Z79899 Other long term (current) drug therapy: Secondary | ICD-10-CM | POA: Diagnosis not present

## 2019-08-18 NOTE — Progress Notes (Signed)
RFV: follow up for hiv disease  Patient ID: Katrina Wolfe, female   DOB: 1966-04-22, 53 y.o.   MRN: [id number]  HPI Katrina Wolfe is a 53yo F with well controlled hiv disease, CD 4 count of 605/VL 37 on bikarty. She has recently had Left charcot foot hw infection s/p removal treated for osteo, would slowly healing  Outpatient Encounter Medications as of 08/18/2019  Medication Sig  . aspirin EC 81 MG tablet Take 1 tablet (81 mg total) by mouth 2 (two) times daily.  Marland Kitchen aspirin-acetaminophen-caffeine (EXCEDRIN MIGRAINE) 250-250-65 MG tablet Take 2 tablets by mouth daily as needed for headache or migraine.  Marland Kitchen atorvastatin (LIPITOR) 10 MG tablet Take 10 mg by mouth every morning.   Marland Kitchen atropine 1 % ophthalmic solution Place 1 drop into both eyes 2 (two) times daily.  . bictegravir-emtricitabine-tenofovir AF (BIKTARVY) 50-200-25 MG TABS tablet Take 1 tablet by mouth daily.  . calcium carbonate (TUMS - DOSED IN MG ELEMENTAL CALCIUM) 500 MG chewable tablet Chew 1 tablet by mouth as needed for indigestion or heartburn.  . cloNIDine (CATAPRES) 0.3 MG tablet Take 0.3 mg by mouth 2 (two) times daily.   Marland Kitchen gabapentin (NEURONTIN) 100 MG capsule Take 100 mg by mouth 3 (three) times daily.  Marland Kitchen glimepiride (AMARYL) 2 MG tablet Take 2 mg by mouth daily.  Marland Kitchen HUMALOG MIX 75/25 KWIKPEN (75-25) 100 UNIT/ML Kwikpen Inject 56 Units into the skin 2 (two) times daily.   Marland Kitchen HYDROcodone-acetaminophen (NORCO/VICODIN) 5-325 MG tablet Take 1 tablet by mouth every 4 (four) hours as needed for moderate pain.  . Insulin Syringe-Needle U-100 (INSULIN SYRINGE 1CC/31GX5/16") 31G X 5/16" 1 ML MISC 1 Units by Does not apply route 2 (two) times daily.  . Lancets (ONETOUCH ULTRASOFT) lancets   . lisinopril (PRINIVIL,ZESTRIL) 10 MG tablet Take 1 tablet (10 mg total) by mouth daily.  Marland Kitchen lisinopril-hydrochlorothiazide (PRINZIDE,ZESTORETIC) 20-25 MG tablet Take 1 tablet by mouth daily.  . metFORMIN (GLUCOPHAGE) 1000 MG tablet Take 1,000 mg by  mouth daily with breakfast.   . ONE TOUCH ULTRA TEST test strip   . VICTOZA 18 MG/3ML SOPN Inject 1.8 mg into the skin every morning.   . ciprofloxacin (CIPRO) 500 MG tablet Take 1 tablet (500 mg total) by mouth 2 (two) times daily.   No facility-administered encounter medications on file as of 08/18/2019.      Patient Active Problem List   Diagnosis Date Noted  . Osteomyelitis of foot ([place]) 04/04/2019  . Hardware complicating wound infection ([place])   . Subacute osteomyelitis, left ankle and foot ([place])   . Open displaced fracture of fifth metatarsal bone of left foot   . Healthcare maintenance 04/03/2019  . HIV disease ([place]) 10/27/2015  . DM type 2 (diabetes mellitus, type 2) ([place]) 10/27/2015  . Hyperlipidemia 10/27/2015  . Essential hypertension 10/27/2015  . Severe vulvar dysplasia, histologically confirmed 10/15/2015     Health Maintenance Due  Topic Date Due  . HEMOGLOBIN A1C  1966-04-20  . FOOT EXAM  08/16/1976  . OPHTHALMOLOGY EXAM  08/16/1976  . COLONOSCOPY  08/16/2016  . INFLUENZA VACCINE  07/19/2019    Social History   Tobacco Use  . Smoking status: Never Smoker  . Smokeless tobacco: Never Used  Substance Use Topics  . Alcohol use: No    Alcohol/week: 0.0 standard drinks  . Drug use: No    Review of Systems12 point ros is negative except for what is mentioned in hpi. Mild discomfort to foot Physical Exam   BP  138/82   Pulse 75   Temp 98.1 F (36.7 C) (Oral)   LMP 05/31/2019   Physical Exam  Constitutional:  oriented to person, place, and time. appears well-developed and well-nourished. No distress.  HENT: /AT, PERRLA, no scleral icterus Mouth/Throat: Oropharynx is clear and moist. No oropharyngeal exudate.  Cardiovascular: Normal rate, regular rhythm and normal heart sounds. Exam reveals no gallop and no friction rub.  No murmur heard.  Pulmonary/Chest: Effort normal and breath sounds normal. No respiratory distress.  has no wheezes.  Neck = supple,  no nuchal rigidity GBT:DVVO foot in hardcast Skin: Skin is warm and dry. No rash noted. No erythema.  Psychiatric: a normal mood and affect.  behavior is normal.   Lab Results  Component Value Date   CD4TCELL 24 (L) 08/04/2019   Lab Results  Component Value Date   CD4TABS 605 08/04/2019   CD4TABS 590 03/20/2019   CD4TABS 540 09/16/2018   Lab Results  Component Value Date   HIV1RNAQUANT 37 (H) 08/04/2019   Lab Results  Component Value Date   HEPBSAB NEG 10/13/2015   Lab Results  Component Value Date   LABRPR NON-REACTIVE 03/20/2019    CBC Lab Results  Component Value Date   WBC 9.7 04/04/2019   RBC 3.54 (L) 04/04/2019   HGB 10.6 (L) 04/04/2019   HCT 34.0 (L) 04/04/2019   PLT 412 (H) 04/04/2019   MCV 96.0 04/04/2019   MCH 29.9 04/04/2019   MCHC 31.2 04/04/2019   RDW 13.2 04/04/2019   LYMPHSABS 2,495 03/20/2019   MONOABS 1.0 03/01/2019   EOSABS 205 03/20/2019    BMET Lab Results  Component Value Date   NA 139 08/04/2019   K 4.6 08/04/2019   CL 104 08/04/2019   CO2 28 08/04/2019   GLUCOSE 188 (H) 08/04/2019   BUN 15 08/04/2019   CREATININE 0.94 08/04/2019   CALCIUM 9.4 08/04/2019   GFRNONAA 55 (L) 04/07/2019   GFRAA >60 04/07/2019      Assessment and Plan Left charcot foot infection s/p HW removal = continue with local wound care per ortho   well controlled hiv disease = continue with biktarvy  Long term medicaiton management = creatinine is stable at this time.   Health maintenance = due for flu shot. Encourage to get over the next month

## 2019-08-27 ENCOUNTER — Other Ambulatory Visit: Payer: Self-pay | Admitting: Internal Medicine

## 2019-08-27 DIAGNOSIS — Z1231 Encounter for screening mammogram for malignant neoplasm of breast: Secondary | ICD-10-CM

## 2019-09-04 ENCOUNTER — Encounter: Payer: Self-pay | Admitting: Orthopedic Surgery

## 2019-09-04 ENCOUNTER — Other Ambulatory Visit: Payer: Self-pay

## 2019-09-04 ENCOUNTER — Ambulatory Visit (INDEPENDENT_AMBULATORY_CARE_PROVIDER_SITE_OTHER): Payer: Medicare Other | Admitting: Orthopedic Surgery

## 2019-09-04 VITALS — Ht 69.0 in | Wt 265.0 lb

## 2019-09-04 DIAGNOSIS — E11610 Type 2 diabetes mellitus with diabetic neuropathic arthropathy: Secondary | ICD-10-CM

## 2019-09-04 DIAGNOSIS — L97522 Non-pressure chronic ulcer of other part of left foot with fat layer exposed: Secondary | ICD-10-CM | POA: Diagnosis not present

## 2019-09-04 DIAGNOSIS — T847XXS Infection and inflammatory reaction due to other internal orthopedic prosthetic devices, implants and grafts, sequela: Secondary | ICD-10-CM

## 2019-09-09 ENCOUNTER — Encounter: Payer: Self-pay | Admitting: Orthopedic Surgery

## 2019-09-09 NOTE — Progress Notes (Signed)
Office Visit Note   Patient: Katrina Wolfe           Date of Birth: [birth year]           MRN: [id number] Visit Date: 09/04/2019              Requested by: Wenda Low, MD [address] [place],  [place] [address] PCP: Wenda Low, MD  Chief Complaint  Patient presents with  . Left Foot - Routine Post Op    04/04/2019 remove HDW, 5th MT      HPI: Patient is a 53 year old woman who presents in follow-up status post excision of bone and removal of deep retained hardware.  Patient is currently in a PRAFO.  Patient's denies any pain she states she has some swelling she is nonweightbearing in the [facility].  Assessment & Plan: Visit Diagnoses:  1. Ulcer of left foot, with fat layer exposed ([place])   2. Hardware complicating wound infection, sequela   3. Charcot's arthropathy associated with type 2 diabetes mellitus ([place])     Plan: The wound was debrided patient will continue with wound care continue with pressure offloading.  Follow-Up Instructions: Return in about 3 weeks (around 09/25/2019).   Ortho Exam  Patient is alert, oriented, no adenopathy, well-dressed, normal affect, normal respiratory effort. Examination there is no redness no cellulitis her foot is plantigrade she does have a ulcer on the heel after informed consent a 10 blade knife was used to debride the skin and soft tissue back to healthy viable granulation tissue this was touched with silver nitrate for hemostasis there is no exposed bone or tendon.  The ulcer is 15 x 20 mm and 3 mm deep.  Imaging: No results found. No images are attached to the encounter.  Labs: Lab Results  Component Value Date   REPTSTATUS 04/08/2019 FINAL 04/04/2019   GRAMSTAIN  04/04/2019    FEW WBC PRESENT,BOTH PMN AND MONONUCLEAR NO ORGANISMS SEEN    CULT  04/04/2019    FEW PSEUDOMONAS AERUGINOSA FEW SERRATIA MARCESCENS FEW STAPHYLOCOCCUS AUREUS CRITICAL RESULT CALLED TO, READ BACK BY AND VERIFIED WITH: RN Suzan Garibaldi [phone] FCP NO ANAEROBES ISOLATED Performed at [facility], [place] [address] [place], [place] [address]    LABORGA PSEUDOMONAS AERUGINOSA 04/04/2019   LABORGA SERRATIA MARCESCENS 04/04/2019   LABORGA STAPHYLOCOCCUS AUREUS 04/04/2019     Lab Results  Component Value Date   ALBUMIN 3.9 03/01/2019   ALBUMIN 3.5 (L) 05/24/2017   ALBUMIN 4.2 11/23/2016    No results found for: MG No results found for: VD25OH  No results found for: PREALBUMIN CBC EXTENDED Latest Ref Rng & Units 04/04/2019 03/20/2019 03/01/2019  WBC 4.0 - 10.5 K/uL 9.7 10.8 16.3(H)  RBC 3.87 - 5.11 MIL/uL 3.54(L) 3.78(L) 3.74(L)  HGB 12.0 - 15.0 g/dL 10.6(L) 11.8 12.0  HCT 36.0 - 46.0 % 34.0(L) 34.8(L) 37.2  PLT 150 - 400 K/uL 412(H) 433(H) 373  NEUTROABS 1,500 - 7,800 cells/uL - 7,420 12.9(H)  LYMPHSABS 850 - 3,900 cells/uL - 2,495 2.2     Body mass index is 39.13 kg/m.  Orders:  No orders of the defined types were placed in this encounter.  No orders of the defined types were placed in this encounter.    Procedures: No procedures performed  Clinical Data: No additional findings.  ROS:  All other systems negative, except as noted in the HPI. Review of Systems  Objective: Vital Signs: Ht 5\' 9"  (1.753 m)  Wt 265 lb (120.2 kg)   BMI 39.13 kg/m   Specialty Comments:  No specialty comments available.  PMFS History: Patient Active Problem List   Diagnosis Date Noted  . Osteomyelitis of foot ([place]) 04/04/2019  . Hardware complicating wound infection ([place])   . Subacute osteomyelitis, left ankle and foot ([place])   . Open displaced fracture of fifth metatarsal bone of left foot   . Healthcare maintenance 04/03/2019  . HIV disease ([place]) 10/27/2015  . DM type 2 (diabetes mellitus, type 2) ([place]) 10/27/2015  . Hyperlipidemia 10/27/2015  . Essential hypertension 10/27/2015  . Severe vulvar dysplasia, histologically confirmed 10/15/2015   Past Medical History:  Diagnosis Date  . Abnormal  uterine bleeding (AUB)   . Arthritis    knees, fingers  . Asthma    as child, no problems in 20 yrs  . Cancer ([place])   . CKD (chronic kidney disease), stage II   . Congenital cardiomegaly    per pt has always been told since childhood and siblings   . GERD (gastroesophageal reflux disease)   . History of genital warts   . HIV (human immunodeficiency virus infection) ([place])    [place]-- DR Carlyle Basques  . Hypertension   . Intermittent palpitations    mild-- no meds  . Legally blind in left eye, as defined in [place]    trauma as child  . Tuberculosis    ? 2005 or 2007  . Type 2 diabetes mellitus (HCC)    Type II   . Uterine fibroid   . VIN III (vulvar intraepithelial neoplasia III)    and Verrucoid lesion on the mons  . Wears glasses     Family History  Problem Relation Age of Onset  . Hypertension Mother   . Diabetes Father   . Cancer Brother   . Breast cancer Cousin     Past Surgical History:  Procedure Laterality Date  . CESAREAN SECTION  2001  &  1984  . CO2 LASER APPLICATION N/A 08/26/8337   Procedure: CO2 LASER APPLICATION AND WIDE VOCAL EXCSION OF LESION ON MONS PUBIS;  Surgeon: Marti Sleigh, MD;  Location: [facility];  Service: Gynecology;  Laterality: N/A;   CASE CANCELLED   . CO2 LASER APPLICATION N/A 2/50/5397   Procedure: CO2 LASER OF VULVAR;  Surgeon: Marti Sleigh, MD;  Location: [facility];  Service: Gynecology;  Laterality: N/A;  . CORNEAL TRANSPLANT Left 2006   failed  . D & C HYSTEROSCOPY /  RESECTION FIBROID  2004  . HARDWARE REMOVAL Left 04/04/2019   Procedure: EXCISION BONE AND REMOVE DEEP HARDWARE LEFT 5TH METATARSAL;  Surgeon: Newt Minion, MD;  Location: [facility];  Service: Orthopedics;  Laterality: Left;  . KNEE ARTHROSCOPY W/ ACL RECONSTRUCTION Bilateral 2008  . ORIF TOE FRACTURE Left 08/02/2017   Procedure: OPEN REDUCTION INTERNAL FIXATION (ORIF) FIFTH METATARSAL (TOE)  BASE FRACTURE;  Surgeon: Wylene Simmer, MD;  Location: [facility];  Service: Orthopedics;  Laterality: Left;  Marland Kitchen VULVECTOMY N/A 01/07/2016   Procedure: WIDE LOCAL EXCISION;  Surgeon: Marti Sleigh, MD;  Location: [facility];  Service: Gynecology;  Laterality: N/A;  mons pubis as site   Social History   Occupational History  . Not on file  Tobacco Use  . Smoking status: Never Smoker  . Smokeless tobacco: Never Used  Substance and Sexual Activity  . Alcohol use: No    Alcohol/week: 0.0 standard drinks  .  Drug use: No  . Sexual activity: Yes    Partners: Male    Birth control/protection: None, Condom    Comment: declined condoms - has at home

## 2019-09-19 ENCOUNTER — Other Ambulatory Visit: Payer: Self-pay | Admitting: Internal Medicine

## 2019-09-19 DIAGNOSIS — B20 Human immunodeficiency virus [HIV] disease: Secondary | ICD-10-CM

## 2019-09-23 ENCOUNTER — Encounter (HOSPITAL_BASED_OUTPATIENT_CLINIC_OR_DEPARTMENT_OTHER): Payer: Self-pay | Admitting: *Deleted

## 2019-09-23 ENCOUNTER — Other Ambulatory Visit: Payer: Self-pay

## 2019-09-25 ENCOUNTER — Ambulatory Visit (INDEPENDENT_AMBULATORY_CARE_PROVIDER_SITE_OTHER): Payer: Medicare Other | Admitting: Orthopedic Surgery

## 2019-09-25 ENCOUNTER — Encounter: Payer: Self-pay | Admitting: Orthopedic Surgery

## 2019-09-25 VITALS — Ht 69.0 in | Wt 269.0 lb

## 2019-09-25 DIAGNOSIS — L97522 Non-pressure chronic ulcer of other part of left foot with fat layer exposed: Secondary | ICD-10-CM

## 2019-09-26 ENCOUNTER — Encounter (HOSPITAL_BASED_OUTPATIENT_CLINIC_OR_DEPARTMENT_OTHER)
Admission: RE | Admit: 2019-09-26 | Discharge: 2019-09-26 | Disposition: A | Discharge status: Home / Self Care | Payer: Medicare Other | Source: Ambulatory Visit | Attending: Obstetrics and Gynecology | Admitting: Obstetrics and Gynecology

## 2019-09-26 ENCOUNTER — Other Ambulatory Visit: Payer: Self-pay

## 2019-09-26 DIAGNOSIS — Z833 Family history of diabetes mellitus: Secondary | ICD-10-CM | POA: Diagnosis not present

## 2019-09-26 DIAGNOSIS — N939 Abnormal uterine and vaginal bleeding, unspecified: Secondary | ICD-10-CM | POA: Diagnosis present

## 2019-09-26 DIAGNOSIS — E1140 Type 2 diabetes mellitus with diabetic neuropathy, unspecified: Secondary | ICD-10-CM | POA: Diagnosis not present

## 2019-09-26 DIAGNOSIS — I10 Essential (primary) hypertension: Secondary | ICD-10-CM | POA: Diagnosis not present

## 2019-09-26 DIAGNOSIS — E785 Hyperlipidemia, unspecified: Secondary | ICD-10-CM | POA: Diagnosis not present

## 2019-09-26 DIAGNOSIS — Z7982 Long term (current) use of aspirin: Secondary | ICD-10-CM | POA: Diagnosis not present

## 2019-09-26 DIAGNOSIS — Z79899 Other long term (current) drug therapy: Secondary | ICD-10-CM | POA: Diagnosis not present

## 2019-09-26 DIAGNOSIS — N840 Polyp of corpus uteri: Secondary | ICD-10-CM | POA: Diagnosis not present

## 2019-09-26 DIAGNOSIS — H5462 Unqualified visual loss, left eye, normal vision right eye: Secondary | ICD-10-CM | POA: Diagnosis not present

## 2019-09-26 DIAGNOSIS — Z91013 Allergy to seafood: Secondary | ICD-10-CM | POA: Diagnosis not present

## 2019-09-26 DIAGNOSIS — Z01812 Encounter for preprocedural laboratory examination: Secondary | ICD-10-CM | POA: Diagnosis present

## 2019-09-26 DIAGNOSIS — Z21 Asymptomatic human immunodeficiency virus [HIV] infection status: Secondary | ICD-10-CM | POA: Diagnosis not present

## 2019-09-26 DIAGNOSIS — Z6841 Body Mass Index (BMI) 40.0 and over, adult: Secondary | ICD-10-CM | POA: Diagnosis not present

## 2019-09-26 DIAGNOSIS — Z8249 Family history of ischemic heart disease and other diseases of the circulatory system: Secondary | ICD-10-CM | POA: Diagnosis not present

## 2019-09-26 DIAGNOSIS — D259 Leiomyoma of uterus, unspecified: Secondary | ICD-10-CM | POA: Diagnosis not present

## 2019-09-26 DIAGNOSIS — Z794 Long term (current) use of insulin: Secondary | ICD-10-CM | POA: Diagnosis not present

## 2019-09-26 DIAGNOSIS — Z881 Allergy status to other antibiotic agents status: Secondary | ICD-10-CM | POA: Diagnosis not present

## 2019-09-26 LAB — BASIC METABOLIC PANEL
Anion gap: 12 (ref 5–15)
BUN: 6 mg/dL (ref 6–20)
CO2: 21 mmol/L — ABNORMAL LOW (ref 22–32)
Calcium: 9.3 mg/dL (ref 8.9–10.3)
Chloride: 106 mmol/L (ref 98–111)
Creatinine, Ser: 0.91 mg/dL (ref 0.44–1.00)
GFR calc Af Amer: >60 mL/min (ref >60)
GFR calc non Af Amer: >60 mL/min (ref >60)
Glucose, Bld: 179 mg/dL — ABNORMAL HIGH (ref 70–99)
Potassium: 4.6 mmol/L (ref 3.5–5.1)
Sodium: 139 mmol/L (ref 135–145)

## 2019-09-26 LAB — POCT PREGNANCY, URINE: Preg Test, Ur: NEGATIVE

## 2019-09-27 ENCOUNTER — Other Ambulatory Visit (HOSPITAL_COMMUNITY)
Admission: RE | Admit: 2019-09-27 | Discharge: 2019-09-27 | Disposition: A | Discharge status: Home / Self Care | Payer: Medicare Other | Source: Ambulatory Visit | Attending: Obstetrics and Gynecology | Admitting: Obstetrics and Gynecology

## 2019-09-27 DIAGNOSIS — Z20828 Contact with and (suspected) exposure to other viral communicable diseases: Secondary | ICD-10-CM | POA: Diagnosis not present

## 2019-09-27 DIAGNOSIS — Z01812 Encounter for preprocedural laboratory examination: Secondary | ICD-10-CM | POA: Diagnosis present

## 2019-09-28 LAB — NOVEL CORONAVIRUS, NAA (HOSP ORDER, SEND-OUT TO REF LAB; TAT 18-24 HRS): SARS-CoV-2, NAA: NOT DETECTED

## 2019-09-30 ENCOUNTER — Encounter: Payer: Self-pay | Admitting: Orthopedic Surgery

## 2019-09-30 NOTE — Progress Notes (Signed)
Office Visit Note   Patient: Katrina Wolfe           Date of Birth: [birth year]           MRN: [id number] Visit Date: 09/25/2019              Requested by: Wenda Low, MD [address] [place],  [place] [address] PCP: Wenda Low, MD  Chief Complaint  Patient presents with  . Left Foot - Routine Post Op    04/04/2019 remove HDW, 5th MT        HPI: Patient is a 53 year old woman with a cavovarus deformity of the left foot she has a ulcer over the base of the cuboid.  Patient is currently wearing a PRAFO to unload the ulcerative area.  She feels like the ulcer is improving.  Assessment & Plan: Visit Diagnoses:  1. Ulcer of left foot, with fat layer exposed ([place])     Plan: Ulcer was debrided Iodosorb dressing applied continue dressing changes continue pressure unloading.  Follow-Up Instructions: Return in about 3 weeks (around 10/16/2019).   Ortho Exam  Patient is alert, oriented, no adenopathy, well-dressed, normal affect, normal respiratory effort. Examination patient has a fixed cavovarus deformity of the left foot.  She has a Medical illustrator grade 1 ulcer over the base of the cuboid.  There is no cellulitis no odor no drainage no exposed bone or tendon.  After informed consent a 10 blade knife was used to debride the skin and soft tissue down to 100% healthy bleeding granulation tissue.  The ulcer is 2 x 3 cm and 5 mm deep Iodosorb dressing was applied.  Imaging: No results found. No images are attached to the encounter.  Labs: Lab Results  Component Value Date   REPTSTATUS 04/08/2019 FINAL 04/04/2019   GRAMSTAIN  04/04/2019    FEW WBC PRESENT,BOTH PMN AND MONONUCLEAR NO ORGANISMS SEEN    CULT  04/04/2019    FEW PSEUDOMONAS AERUGINOSA FEW SERRATIA MARCESCENS FEW STAPHYLOCOCCUS AUREUS CRITICAL RESULT CALLED TO, READ BACK BY AND VERIFIED WITH: RN Suzan Garibaldi [phone] FCP NO ANAEROBES ISOLATED Performed at [facility], [place] [address],  [place], [place] [address]    LABORGA PSEUDOMONAS AERUGINOSA 04/04/2019   LABORGA SERRATIA MARCESCENS 04/04/2019   LABORGA STAPHYLOCOCCUS AUREUS 04/04/2019     Lab Results  Component Value Date   ALBUMIN 3.9 03/01/2019   ALBUMIN 3.5 (L) 05/24/2017   ALBUMIN 4.2 11/23/2016    No results found for: MG No results found for: VD25OH  No results found for: PREALBUMIN CBC EXTENDED Latest Ref Rng & Units 04/04/2019 03/20/2019 03/01/2019  WBC 4.0 - 10.5 K/uL 9.7 10.8 16.3(H)  RBC 3.87 - 5.11 MIL/uL 3.54(L) 3.78(L) 3.74(L)  HGB 12.0 - 15.0 g/dL 10.6(L) 11.8 12.0  HCT 36.0 - 46.0 % 34.0(L) 34.8(L) 37.2  PLT 150 - 400 K/uL 412(H) 433(H) 373  NEUTROABS 1,500 - 7,800 cells/uL - 7,420 12.9(H)  LYMPHSABS 850 - 3,900 cells/uL - 2,495 2.2     Body mass index is 39.72 kg/m.  Orders:  No orders of the defined types were placed in this encounter.  No orders of the defined types were placed in this encounter.    Procedures: No procedures performed  Clinical Data: No additional findings.  ROS:  All other systems negative, except as noted in the HPI. Review of Systems  Objective: Vital Signs: Ht 5\' 9"  (1.753 m)   Wt 269 lb (122 kg)   LMP 08/30/2019 (  Approximate)   BMI 39.72 kg/m   Specialty Comments:  No specialty comments available.  PMFS History: Patient Active Problem List   Diagnosis Date Noted  . Osteomyelitis of foot ([place]) 04/04/2019  . Hardware complicating wound infection ([place])   . Subacute osteomyelitis, left ankle and foot ([place])   . Open displaced fracture of fifth metatarsal bone of left foot   . Healthcare maintenance 04/03/2019  . HIV disease ([place]) 10/27/2015  . DM type 2 (diabetes mellitus, type 2) ([place]) 10/27/2015  . Hyperlipidemia 10/27/2015  . Essential hypertension 10/27/2015  . Severe vulvar dysplasia, histologically confirmed 10/15/2015   Past Medical History:  Diagnosis Date  . Abnormal uterine bleeding (AUB)   . Arthritis    knees, fingers  . Asthma     as child, no problems in 20 yrs  . Cancer ([place])   . CKD (chronic kidney disease), stage II   . Congenital cardiomegaly    per pt has always been told since childhood and siblings   . GERD (gastroesophageal reflux disease)   . History of genital warts   . HIV (human immunodeficiency virus infection) ([place])    [place]-- DR Carlyle Basques  . Hypertension   . Intermittent palpitations    mild-- no meds  . Legally blind in left eye, as defined in [place]    trauma as child  . Tuberculosis    ? 2005 or 2007  . Type 2 diabetes mellitus (HCC)    Type II   . Uterine fibroid   . VIN III (vulvar intraepithelial neoplasia III)    and Verrucoid lesion on the mons  . Wears glasses     Family History  Problem Relation Age of Onset  . Hypertension Mother   . Diabetes Father   . Cancer Brother   . Breast cancer Cousin     Past Surgical History:  Procedure Laterality Date  . CESAREAN SECTION  2001  &  1984  . CO2 LASER APPLICATION N/A 20/08/4708   Procedure: CO2 LASER APPLICATION AND WIDE VOCAL EXCSION OF LESION ON MONS PUBIS;  Surgeon: Marti Sleigh, MD;  Location: [facility];  Service: Gynecology;  Laterality: N/A;   CASE CANCELLED   . CO2 LASER APPLICATION N/A 06/14/3661   Procedure: CO2 LASER OF VULVAR;  Surgeon: Marti Sleigh, MD;  [facility];  Service: Gynecology;  Laterality: N/A;  . CORNEAL TRANSPLANT Left 2006   failed  . D & C HYSTEROSCOPY /  RESECTION FIBROID  2004  . HARDWARE REMOVAL Left 04/04/2019   Procedure: EXCISION BONE AND REMOVE DEEP HARDWARE LEFT 5TH METATARSAL;  Surgeon: Newt Minion, MD;  Location: [facility];  Service: Orthopedics;  Laterality: Left;  . KNEE ARTHROSCOPY W/ ACL RECONSTRUCTION Bilateral 2008  . ORIF TOE FRACTURE Left 08/02/2017   Procedure: OPEN REDUCTION INTERNAL FIXATION (ORIF) FIFTH METATARSAL (TOE) BASE FRACTURE;  Surgeon: Wylene Simmer, MD;  Location: [facility];  Service: Orthopedics;  Laterality: Left;  Marland Kitchen VULVECTOMY N/A 01/07/2016   Procedure: WIDE LOCAL EXCISION;  Surgeon: Marti Sleigh, MD;  Location[facility];  Service: Gynecology;  Laterality: N/A;  mons pubis as site   Social History   Occupational History  . Not on file  Tobacco Use  . Smoking status: Never Smoker  . Smokeless tobacco: Never Used  Substance and Sexual Activity  . Alcohol use: No    Alcohol/week: 0.0 standard drinks  . Drug use:  No  . Sexual activity: Yes    Partners: Male    Birth control/protection: None, Condom    Comment: declined condoms - has at home

## 2019-09-30 NOTE — H&P (Signed)
History of Present Illness  Isolation Precautions:          Respiratory Illness Screening             1. Is fever present / reported?  No           2. Are respiratory illness symptom(s) present / reported?  No           3. Are other symptom(s) present / reported?  No           5. Has there been reported travel to a High Risk respiratory illness region?  No           6. Has close* contact with person(s) known to have communicable illness been reported?  No           7. Did travel or close contact (if applicable) occur within 14 days of symptom onset?  No General:          53 y/o Female presents for preoperative examination before Hyst/D&C, HTA endometrial ablation, and Myosure.        She reports she bleeds heavy like she is today regularly and has been ongoing since LMP date of 08/31/2019 with passage of clots. She is wearing 2 pads at a time, denies using tampons.         EMB-Pathology 07/2019, inactive endometrium, no hyperplasia or malignancy.        SHG showed a 3.7 cm mobile mass occupying the entire endometrial cavity.        Surgery scheduled for 10/01/2019.    Current Medications  TakingVictoza(Liraglutide) 18 MG/3ML Solution Pen-injector Inject 1.8mg  subcutaneously once a day for diabetes    Glimepiride 2 MG Tablet Take 2 tablets by mouth in the morning and 1 tablet in the evening with meals    Neurontin(Gabapentin) 100 MG Capsule Take 1 or 2 capsules by mouth up to twice a day as needed    Unifine Pentips(Insulin Pen Needle) 31G X 8 MM Miscellaneous USE THREE TIMES PER DAY AS DIRECTED    MetFORMIN HCl ER 500 MG Tablet Extended Release 24 Hour 1 tablet Orally Once a day with a meal    Humalog Mix 75/25 KwikPen(Insulin Lispro Prot & Lispro) (75-25) 100 UNIT/ML Suspension Pen-injector Inject 60 units subcutaneously with morning and evening meals (continue titrating up to max of 75 units twice daily)    Ferrous Sulfate 325 (65 Fe) MG Tablet Delayed Release 1 tablet Orally Once a  day    Bictegravir-Emtricitab-Tenofov 50-200-25 MG Tablet 1 tablet Orally Once a day    Lisinopril-Hydrochlorothiazide 20-25 MG Tablet Take 1 tablet by mouth once a day each morning for blood pressure and kindey protection    Atorvastatin Calcium 10 MG Tablet Take 1 tablet by mouth once a day for cholesterol    Clonidine HCl 0.3 MG Tablet Take 1 tablet by mouth twice a day in morning and evening for blood pressure    One Touch Ultra Blue test strips Test Strips use to check blood glucose up to twice a day morning and evening E11.65; Z79.4 - uncontrolled type 2 DM with long term insulin use    BD Pen Needle Ultrafine 31G/22mm short as directed in vitro use with insulin pen and Victoza (three injections per day)    Aspirin 81 MG Tablet Delayed Release 1 tablet Orally Once a day, Notes: in am    BD Pen Needle Short U/F(Insulin Pen Needle) 31G X 8 MM Miscellaneous USE AS DIRECTED WITH INSULIN PENS THREE  TIMES DAILY    Lancets Micro Thin 33G - Miscellaneous Use to check BG twice daily    Tums(Calcium Carbonate) 500 MG Tablet Chewable 1 tablet Orally Once a day, Notes: Listed in Epic    Lysteda(Tranexamic Acid) 650 MG Tablet 2 tablets Orally TID up to 5 days with menses as needed, Notes: prn    Not-TakingB Complex - Tablet 1 tablet Orally once a day    Calcium + D3 600-200 MG-UNIT Tablet 1 tablet with a meal Orally Once a day, Notes: in am    Coconut Oil 1000 MG Capsule 2 capsules Orally once a day, Notes: in am    Hydrocodone-Acetaminophen 5-325 MG Tablet 1 tablet as needed for moderate pain Orally every 4 hrs, Notes: Hospital    Excedrin Migraine(Aspirin-Acetaminophen-Caffeine) [phone] MG Tablet 2 tablets Orally Once a day, Notes: Listed in Epic    Atropine Sulfate 1 % Ointment 1 application into the lower eyelid of affected eye Ophthalmic Once a day, Notes: Listed in Epic    Medication List reviewed and reconciled with the patient        Past Medical History       HTN.         HIV- since 1999- seen by ID clinic in [place]- moved in last sept 2015- Dr Baxter Flattery ID.        DM type 2 on insulin since 2002 - poor control.        DM- neuropathy.        HLP- on lipitor.        Left eye blindness- childhood injury.        H/o Uterine fibroids.        Genital Warts.         VIN III (Vulvar dysplasia)-Dr. Aldean Ast.        optho- [facility]- dr Valetta Close.        left foot fx- s/p surg- with plates/ screws- Dr Doran Durand- 7/18.        Nodule in left breast.        right foot Diabetes ulcer with callus- 2019- Dr Sharol Given.        [facility].            Surgical History  left corneal surg- failed 2006  knee surg- both - ligament repair 2006  Hysteroscopy resection of fibroids 2004  Vulvar lesion- removed- VIN III- dr Carlis Abbott person 2017  left foot- fx s/p repiar with metal plate and screws 9937  excision bone and remove deep hardware left 5th metatarsal- Dr. Sharol Given 03/2019      Family History  Father: deceased 36 yrs, diagnosed with Diabetes, Hypertension  Mother: alive 64 yrs, Hypertension, Lung Cancer  Brother 1: deceased 95 yrs, Testicular cancer  Brother2: deceased  Sister 1: alive, Diabetes  1 sister(s) . 2 son(s) .   1st Maternal Cousin with hx of breast cancer.      Social History  General:         no EXPOSURE TO PASSIVE SMOKE.        no Alcohol.        Children: Boys, 2.        no Caffeine.        COMMUNICATION BARRIERS: none.        DIET: good.        Tobacco use               cigarettes:  Never smoked  Tobacco history last updated  09/22/2019       DENTAL CARE: good.        Marital Status: single.        no Recreational drug use.        OCCUPATION: disabled.        Exercise: yes, 1-2 times per week.     Gyn History  Sexual activity not currently sexually active.   Periods : irregular.   LMP 08/31/2019-currently.   Denies H/O Birth control.   Last pap smear date 01/07/2019-neg.   Last mammogram date 07/09/2018.    Abnormal pap smear No history of abnormal paps.   STD HIV, since 1999.      OB History  Number of pregnancies  2.   Pregnancy # 1  live birth, C-section delivery.   Pregnancy # 2  live birth, C-section.      Allergies  Bactrim: hives - Allergy  shellfish: swelling - Allergy      Hospitalization/Major Diagnostic Procedure  Not in last year 09/2017      Review of Systems  See scanned ROS form for detail Denies fever/chills, chest pain, SOB, headaches, numbness/tingling. No h/o complication with anesthesia, bleeding disorders or blood clots See HPI.    Vital Signs  Wt w/c, Ht w/c, Temp 97.6, Pulse sitting 80, BP sitting 148/92.    Physical Examination  GENERAL:          Patient appears  alert and oriented.          General Appearance:  well-appearing, well-developed, no acute distress.          Speech:  clear.   LUNGS:          Auscultation:  no wheezing/rhonchi/rales. CTA bilaterally.          Effort:  no respiratory distress, comfortable breathing.   HEART:          Heart sounds:  normal. RRR. no murmur.   ABDOMEN:          General:  soft nontender, no masses , no masses tenderness or organomegaly, non distended.   FEMALE GENITOURINARY:          General  Blood in between her thighs and under pannus, .          Cervix  visualized, healthy appearing, no discharge, no lesions.          Adnexa:  no mass, non tender.          Uterus:  normal size/shape/consistency, freely mobile, non tender.          Vagina:  pink/moist mucosa, no lesions, no abnormal discharge, No active bleeding, no blood in vault.          Vulva:  normal, no lesions, no skin discoloration.          Anus:  no external hemosrhoids.   EXTREMITIES:          general  no edema.          General:  No edema or calf tenderness.       Pt aware of scribe services today.    Assessments    1. Encounter for other preprocedural examination - Z01.818 (Primary)  2. Abnormal uterine bleeding (AUB) - N93.9       Treatment  1. Encounter for other preprocedural examination   Notes: Pt counseled on R/B/A of HTA ablation, Hyst/D&C and Myosure, including but not limited to infection, bleeding and injury to organs in the abdomen. Pt  with h/o DM Type 2, requires preoperative clearance from PCP.      2. Abnormal uterine bleeding (AUB)   Start Provera Tablet, 10 MG, 1 tablet with food, Orally, one tablet daily x 10 days, 10 days, 10, Refills 1 Notes: Pt counseled on R/B/A of HTA ablation, Hyst/D&C and Myosure, including but not limited to infection, bleeding and injury to organs in the abdomen. Pt counseled on instructions for use of Provera,.         Procedures  Scribe Documentation:          Attestation:  I personally scribed for Dr. Simona Huh on the date of this appointment. Electronically signed by scribe , Anastasio Auerbach 09/22/2019 11:27:14 AM > .       Visit Codes  99213 OV LEVEL 3.       Follow Up  2 Weeks post op

## 2019-10-01 ENCOUNTER — Ambulatory Visit (HOSPITAL_BASED_OUTPATIENT_CLINIC_OR_DEPARTMENT_OTHER): Payer: Medicare Other | Admitting: Certified Registered"

## 2019-10-01 ENCOUNTER — Encounter (HOSPITAL_BASED_OUTPATIENT_CLINIC_OR_DEPARTMENT_OTHER)
Admission: RE | Disposition: A | Discharge status: Home / Self Care | Payer: Self-pay | Source: Home / Self Care | Attending: Obstetrics and Gynecology

## 2019-10-01 ENCOUNTER — Ambulatory Visit (HOSPITAL_BASED_OUTPATIENT_CLINIC_OR_DEPARTMENT_OTHER)
Admission: RE | Admit: 2019-10-01 | Discharge: 2019-10-01 | Disposition: A | Discharge status: Home / Self Care | Payer: Medicare Other | Attending: Obstetrics and Gynecology | Admitting: Obstetrics and Gynecology

## 2019-10-01 ENCOUNTER — Encounter (HOSPITAL_BASED_OUTPATIENT_CLINIC_OR_DEPARTMENT_OTHER): Payer: Self-pay | Admitting: Certified Registered"

## 2019-10-01 DIAGNOSIS — Z21 Asymptomatic human immunodeficiency virus [HIV] infection status: Secondary | ICD-10-CM | POA: Diagnosis not present

## 2019-10-01 DIAGNOSIS — Z8249 Family history of ischemic heart disease and other diseases of the circulatory system: Secondary | ICD-10-CM | POA: Insufficient documentation

## 2019-10-01 DIAGNOSIS — Z794 Long term (current) use of insulin: Secondary | ICD-10-CM | POA: Insufficient documentation

## 2019-10-01 DIAGNOSIS — Z91013 Allergy to seafood: Secondary | ICD-10-CM | POA: Insufficient documentation

## 2019-10-01 DIAGNOSIS — E785 Hyperlipidemia, unspecified: Secondary | ICD-10-CM | POA: Insufficient documentation

## 2019-10-01 DIAGNOSIS — N840 Polyp of corpus uteri: Secondary | ICD-10-CM | POA: Insufficient documentation

## 2019-10-01 DIAGNOSIS — Z79899 Other long term (current) drug therapy: Secondary | ICD-10-CM | POA: Insufficient documentation

## 2019-10-01 DIAGNOSIS — Z881 Allergy status to other antibiotic agents status: Secondary | ICD-10-CM | POA: Insufficient documentation

## 2019-10-01 DIAGNOSIS — Z833 Family history of diabetes mellitus: Secondary | ICD-10-CM | POA: Insufficient documentation

## 2019-10-01 DIAGNOSIS — N939 Abnormal uterine and vaginal bleeding, unspecified: Secondary | ICD-10-CM | POA: Diagnosis not present

## 2019-10-01 DIAGNOSIS — Z6841 Body Mass Index (BMI) 40.0 and over, adult: Secondary | ICD-10-CM | POA: Insufficient documentation

## 2019-10-01 DIAGNOSIS — I10 Essential (primary) hypertension: Secondary | ICD-10-CM | POA: Insufficient documentation

## 2019-10-01 DIAGNOSIS — D259 Leiomyoma of uterus, unspecified: Secondary | ICD-10-CM | POA: Diagnosis not present

## 2019-10-01 DIAGNOSIS — Z7982 Long term (current) use of aspirin: Secondary | ICD-10-CM | POA: Insufficient documentation

## 2019-10-01 DIAGNOSIS — H5462 Unqualified visual loss, left eye, normal vision right eye: Secondary | ICD-10-CM | POA: Insufficient documentation

## 2019-10-01 DIAGNOSIS — E1140 Type 2 diabetes mellitus with diabetic neuropathy, unspecified: Secondary | ICD-10-CM | POA: Insufficient documentation

## 2019-10-01 HISTORY — PX: DILATATION & CURETTAGE/HYSTEROSCOPY WITH MYOSURE: SHX6511

## 2019-10-01 LAB — POCT HEMOGLOBIN-HEMACUE: Hemoglobin: 10.8 g/dL — ABNORMAL LOW (ref 12.0–15.0)

## 2019-10-01 LAB — GLUCOSE, CAPILLARY
Glucose-Capillary: 164 mg/dL — ABNORMAL HIGH (ref 70–99)
Glucose-Capillary: 150 mg/dL — ABNORMAL HIGH (ref 70–99)

## 2019-10-01 SURGERY — DILATATION & CURETTAGE/HYSTEROSCOPY WITH MYOSURE
Anesthesia: General | Site: Uterus

## 2019-10-01 MED ORDER — OXYCODONE HCL 5 MG PO TABS: 5.0000 mg | ORAL_TABLET | Freq: Once | ORAL | Status: DC | PRN

## 2019-10-01 MED ORDER — SODIUM CHLORIDE 0.9 % IR SOLN
Status: DC | PRN
  Administered 2019-10-01: 6000 mL

## 2019-10-01 MED ORDER — ONDANSETRON HCL 4 MG/2ML IJ SOLN
INTRAMUSCULAR | Status: DC | PRN
  Administered 2019-10-01: 4 mg via INTRAVENOUS

## 2019-10-01 MED ORDER — LIDOCAINE HCL 2 % IJ SOLN
INTRAMUSCULAR | Status: DC | PRN
  Administered 2019-10-01: 10 mL

## 2019-10-01 MED ORDER — VASOPRESSIN 20 UNIT/ML IV SOLN
INTRAVENOUS | Status: AC
  Filled 2019-10-01: qty 1

## 2019-10-01 MED ORDER — FENTANYL CITRATE (PF) 100 MCG/2ML IJ SOLN
INTRAMUSCULAR | Status: AC
  Filled 2019-10-01: qty 2

## 2019-10-01 MED ORDER — LACTATED RINGERS IV SOLN
INTRAVENOUS | Status: DC
  Administered 2019-10-01 (×2): via INTRAVENOUS

## 2019-10-01 MED ORDER — VASOPRESSIN 20 UNIT/ML IV SOLN
INTRAVENOUS | Status: DC | PRN
  Administered 2019-10-01: 10 mL via INTRAMUSCULAR

## 2019-10-01 MED ORDER — PROPOFOL 10 MG/ML IV BOLUS
INTRAVENOUS | Status: DC | PRN
  Administered 2019-10-01: 200 mg via INTRAVENOUS

## 2019-10-01 MED ORDER — LIDOCAINE HCL 2 % IJ SOLN
INTRAMUSCULAR | Status: AC
  Filled 2019-10-01: qty 20

## 2019-10-01 MED ORDER — DEXAMETHASONE SODIUM PHOSPHATE 4 MG/ML IJ SOLN
INTRAMUSCULAR | Status: DC | PRN
  Administered 2019-10-01: 4 mg via INTRAVENOUS

## 2019-10-01 MED ORDER — OXYCODONE HCL 5 MG/5ML PO SOLN: 5.0000 mg | Freq: Once | ORAL | Status: DC | PRN

## 2019-10-01 MED ORDER — LIDOCAINE HCL (CARDIAC) PF 100 MG/5ML IV SOSY
PREFILLED_SYRINGE | INTRAVENOUS | Status: DC | PRN
  Administered 2019-10-01: 60 mg via INTRAVENOUS

## 2019-10-01 MED ORDER — FENTANYL CITRATE (PF) 100 MCG/2ML IJ SOLN
INTRAMUSCULAR | Status: DC | PRN
  Administered 2019-10-01: 50 ug via INTRAVENOUS
  Administered 2019-10-01: 100 ug via INTRAVENOUS
  Administered 2019-10-01: 50 ug via INTRAVENOUS

## 2019-10-01 MED ORDER — MIDAZOLAM HCL 5 MG/5ML IJ SOLN
INTRAMUSCULAR | Status: DC | PRN
  Administered 2019-10-01: 2 mg via INTRAVENOUS

## 2019-10-01 MED ORDER — PROMETHAZINE HCL 25 MG/ML IJ SOLN: 6.2500 mg | INTRAMUSCULAR | Status: DC | PRN

## 2019-10-01 MED ORDER — ACETAMINOPHEN 500 MG PO TABS: 1000.0000 mg | ORAL_TABLET | Freq: Three times a day (TID) | ORAL | 0 refills | Status: DC | PRN

## 2019-10-01 MED ORDER — MIDAZOLAM HCL 2 MG/2ML IJ SOLN
INTRAMUSCULAR | Status: AC
  Filled 2019-10-01: qty 2

## 2019-10-01 MED ORDER — ACETAMINOPHEN 10 MG/ML IV SOLN: 1000.0000 mg | Freq: Once | INTRAVENOUS | Status: DC | PRN

## 2019-10-01 MED ORDER — SILVER NITRATE-POT NITRATE 75-25 % EX MISC
CUTANEOUS | Status: AC
  Filled 2019-10-01: qty 1

## 2019-10-01 MED ORDER — FENTANYL CITRATE (PF) 100 MCG/2ML IJ SOLN: 25.0000 ug | INTRAMUSCULAR | Status: DC | PRN

## 2019-10-01 SURGICAL SUPPLY — 26 items
BRIEF STRETCH FOR OB PAD XXL (UNDERPADS AND DIAPERS) ×2 IMPLANT
CANISTER SUCT 3000ML PPV (MISCELLANEOUS) IMPLANT
CATH ROBINSON RED A/P 14FR (CATHETERS) ×2 IMPLANT
CLOTH BEACON ORANGE TIMEOUT ST (SAFETY) ×2 IMPLANT
DEVICE MYOSURE LITE (MISCELLANEOUS) IMPLANT
DEVICE MYOSURE REACH (MISCELLANEOUS) ×2 IMPLANT
DILATOR CANAL MILEX (MISCELLANEOUS) ×2 IMPLANT
GLOVE BIOGEL PI IND STRL 6.5 (GLOVE) ×1 IMPLANT
GLOVE BIOGEL PI IND STRL 7.0 (GLOVE) ×2 IMPLANT
GLOVE BIOGEL PI INDICATOR 6.5 (GLOVE) ×1
GLOVE BIOGEL PI INDICATOR 7.0 (GLOVE) ×2
GLOVE SURG SS PI 6.5 STRL IVOR (GLOVE) ×2 IMPLANT
GLOVE SURG SS PI 7.0 STRL IVOR (GLOVE) ×2 IMPLANT
GOWN STRL REUS W/TWL LRG LVL3 (GOWN DISPOSABLE) ×4 IMPLANT
IV NS IRRIG 3000ML ARTHROMATIC (IV SOLUTION) ×6 IMPLANT
KIT PROCEDURE FLUENT (KITS) ×2 IMPLANT
MYOSURE XL FIBROID (MISCELLANEOUS)
PACK VAGINAL MINOR WOMEN LF (CUSTOM PROCEDURE TRAY) ×2 IMPLANT
PAD OB MATERNITY 4.3X12.25 (PERSONAL CARE ITEMS) ×2 IMPLANT
PAD PREP 24X48 CUFFED NSTRL (MISCELLANEOUS) ×2 IMPLANT
SEAL ROD LENS SCOPE MYOSURE (ABLATOR) ×2 IMPLANT
SLEEVE SCD COMPRESS KNEE MED (MISCELLANEOUS) ×2 IMPLANT
SURGILUBE 2OZ TUBE FLIPTOP (MISCELLANEOUS) ×2 IMPLANT
SYSTEM TISS REMOVAL MYOSURE XL (MISCELLANEOUS) IMPLANT
TOWEL GREEN STERILE FF (TOWEL DISPOSABLE) ×4 IMPLANT
WATER STERILE IRR 1000ML POUR (IV SOLUTION) IMPLANT

## 2019-10-01 NOTE — Anesthesia Procedure Notes (Signed)
Procedure Name: LMA Insertion Date/Time: 10/01/2019 7:33 AM Performed by: Signe Colt, CRNA Pre-anesthesia Checklist: Patient identified, Emergency Drugs available, Suction available and Patient being monitored Patient Re-evaluated:Patient Re-evaluated prior to induction Oxygen Delivery Method: Circle system utilized Preoxygenation: Pre-oxygenation with 100% oxygen Induction Type: IV induction Ventilation: Mask ventilation without difficulty LMA: LMA inserted LMA Size: 4.0 Number of attempts: 1 Airway Equipment and Method: Bite block Placement Confirmation: positive ETCO2 Tube secured with: Tape Dental Injury: Teeth and Oropharynx as per pre-operative assessment

## 2019-10-01 NOTE — Interval H&P Note (Signed)
History and Physical Interval Note:  10/01/2019 8:15 AM  Katrina Wolfe  has presented today for surgery, with the diagnosis of N93.9 Abnormal uterine bleeding.  The various methods of treatment have been discussed with the patient and family. After consideration of risks, benefits and other options for treatment, the patient has consented to  Procedure(s) with comments: DILATATION & CURETTAGE/HYSTEROSCOPY WITH MYOSURE and HYDROTHERMAL ABLATION (N/A) - HTA rep will be here. Confirmed on 09/25/19 CS as a surgical intervention.  The patient's history has been reviewed, patient examined, no change in status, stable for surgery.  I have reviewed the patient's chart and labs.  Questions were answered to the patient's satisfaction.     Thurnell Lose

## 2019-10-01 NOTE — Anesthesia Preprocedure Evaluation (Signed)
Anesthesia Evaluation  Patient identified by MRN, date of birth, ID band Patient awake    Reviewed: Allergy & Precautions, NPO status , Patient's Chart, lab work & pertinent test results  Airway Mallampati: II  TM Distance: >3 FB Neck ROM: Full    Dental no notable dental hx.    Pulmonary neg pulmonary ROS,    Pulmonary exam normal breath sounds clear to auscultation       Cardiovascular hypertension, Normal cardiovascular exam Rhythm:Regular Rate:Normal     Neuro/Psych negative neurological ROS  negative psych ROS   GI/Hepatic Neg liver ROS, GERD  ,  Endo/Other  diabetesMorbid obesity  Renal/GU negative Renal ROS  negative genitourinary   Musculoskeletal negative musculoskeletal ROS (+)   Abdominal   Peds negative pediatric ROS (+)  Hematology  (+) HIV,   Anesthesia Other Findings   Reproductive/Obstetrics negative OB ROS                             Anesthesia Physical Anesthesia Plan  ASA: III  Anesthesia Plan: General   Post-op Pain Management:    Induction: Intravenous  PONV Risk Score and Plan: 3 and Ondansetron, Dexamethasone and Treatment may vary due to age or medical condition  Airway Management Planned: LMA  Additional Equipment:   Intra-op Plan:   Post-operative Plan: Extubation in OR  Informed Consent: I have reviewed the patients History and Physical, chart, labs and discussed the procedure including the risks, benefits and alternatives for the proposed anesthesia with the patient or authorized representative who has indicated his/her understanding and acceptance.     Dental advisory given  Plan Discussed with: CRNA and Surgeon  Anesthesia Plan Comments:         Anesthesia Quick Evaluation

## 2019-10-01 NOTE — Discharge Instructions (Addendum)
Endometrial Ablation  Endometrial ablation is a procedure that destroys the thin inner layer of the lining of the uterus (endometrium). This procedure may be done:  To stop heavy periods.  To stop bleeding that is causing anemia.  To control irregular bleeding.  To treat bleeding caused by small tumors (fibroids) in the endometrium.  This procedure is often an alternative to major surgery, such as removal of the uterus and cervix (hysterectomy). As a result of this procedure:  You may not be able to have children. However, if you are premenopausal (you have not gone through menopause):  You may still have a small chance of getting pregnant.  You will need to use a reliable method of birth control after the procedure to prevent pregnancy.  You may stop having a menstrual period, or you may have only a small amount of bleeding during your period. Menstruation may return several years after the procedure.  Tell a health care provider about:  Any allergies you have.  All medicines you are taking, including vitamins, herbs, eye drops, creams, and over-the-counter medicines.  Any problems you or family members have had with the use of anesthetic medicines.  Any blood disorders you have.  Any surgeries you have had.  Any medical conditions you have.  What are the risks?  Generally, this is a safe procedure. However, problems may occur, including:  A hole (perforation) in the uterus or bowel.  Infection of the uterus, bladder, or vagina.  Bleeding.  Damage to other structures or organs.  An air bubble in the lung (air embolus).  Problems with pregnancy after the procedure.  Failure of the procedure.  Decreased ability to diagnose cancer in the endometrium.  What happens before the procedure?  You will have tests of your endometrium to make sure there are no pre-cancerous cells or cancer cells present.  You may have an ultrasound of the uterus.  You may be given medicines to thin the endometrium.  Ask your health care  provider about:  Changing or stopping your regular medicines. This is especially important if you take diabetes medicines or blood thinners.  Taking medicines such as aspirin and ibuprofen. These medicines can thin your blood. Do not take these medicines before your procedure if your doctor tells you not to.  Plan to have someone take you home from the hospital or clinic.  What happens during the procedure?    You will lie on an exam table with your feet and legs supported as in a pelvic exam.  To lower your risk of infection:  Your health care team will wash or sanitize their hands and put on germ-free (sterile) gloves.  Your genital area will be washed with soap.  An IV tube will be inserted into one of your veins.  You will be given a medicine to help you relax (sedative).  A surgical instrument with a light and camera (resectoscope) will be inserted into your vagina and moved into your uterus. This allows your surgeon to see inside your uterus.  Endometrial tissue will be removed using one of the following methods:  Radiofrequency. This method uses a radiofrequency-alternating electric current to remove the endometrium.  Cryotherapy. This method uses extreme cold to freeze the endometrium.  Heated-free liquid. This method uses a heated saltwater (saline) solution to remove the endometrium.  Microwave. This method uses high-energy microwaves to heat up the endometrium and remove it.  Thermal balloon. This method involves inserting a catheter with a balloon tip into   heated fluid to remove the endometrium. The procedure may vary among health care providers and hospitals. What happens after the procedure?  Your blood pressure, heart rate, breathing rate, and blood oxygen level will be monitored until the medicines you were given have worn off.  As tissue healing occurs, you may notice  vaginal bleeding for 4-6 weeks after the procedure. You may also experience: ? Cramps. ? Thin, watery vaginal discharge that is light pink or brown in color. ? A need to urinate more frequently than usual. ? Nausea.  Do not drive for 24 hours if you were given a sedative.  Do not have sex or insert anything into your vagina until your health care provider approves. Summary  Endometrial ablation is done to treat the many causes of heavy menstrual bleeding.  The procedure may be done only after medications have been tried to control the bleeding.  Plan to have someone take you home from the hospital or clinic. This information is not intended to replace advice given to you by your health care provider. Make sure you discuss any questions you have with your health care provider. Document Released: 10/13/2004 Document Revised: 05/21/2018 Document Reviewed: 12/21/2016 Elsevier Patient Education  [place] Instructions  Activity: Get plenty of rest for the remainder of the day. A responsible individual must stay with you for 24 hours following the procedure.  For the next 24 hours, DO NOT: -Drive a car -Paediatric nurse -Drink alcoholic beverages -Take any medication unless instructed by your physician -Make any legal decisions or sign important papers.  Meals: Start with liquid foods such as gelatin or soup. Progress to regular foods as tolerated. Avoid greasy, spicy, heavy foods. If nausea and/or vomiting occur, drink only clear liquids until the nausea and/or vomiting subsides. Call your physician if vomiting continues.  Special Instructions/Symptoms: Your throat may feel dry or sore from the anesthesia or the breathing tube placed in your throat during surgery. If this causes discomfort, gargle with warm salt water. The discomfort should disappear within 24 hours.  If you had a scopolamine patch placed behind your ear for the management of  post- operative nausea and/or vomiting:  1. The medication in the patch is effective for 72 hours, after which it should be removed.  Wrap patch in a tissue and discard in the trash. Wash hands thoroughly with soap and water. 2. You may remove the patch earlier than 72 hours if you experience unpleasant side effects which may include dry mouth, dizziness or visual disturbances. 3. Avoid touching the patch. Wash your hands with soap and water after contact with the patch.

## 2019-10-01 NOTE — Transfer of Care (Signed)
Immediate Anesthesia Transfer of Care Note  Patient: Hue Kassebaum  Procedure(s) Performed: DILATATION & CURETTAGE/HYSTEROSCOPY WITH MYOSURE and HYDROTHERMAL ABLATION (N/A )  Patient Location: PACU  Anesthesia Type:General  Level of Consciousness: drowsy and patient cooperative  Airway & Oxygen Therapy: Patient Spontanous Breathing and Patient connected to face mask oxygen  Post-op Assessment: Report given to RN and Post -op Vital signs reviewed and stable  Post vital signs: Reviewed and stable  Last Vitals:  Vitals Value Taken Time  BP 162/115 10/01/19 0947  Temp    Pulse 81 10/01/19 0948  Resp 16 10/01/19 0948  SpO2 100 % 10/01/19 0948  Vitals shown include unvalidated device data.  Last Pain:  Vitals:   10/01/19 0733  TempSrc: Oral  PainSc: 0-No pain      Patients Stated Pain Goal: 3 (42/59/56 [id number])  Complications: No apparent anesthesia complications

## 2019-10-01 NOTE — Anesthesia Postprocedure Evaluation (Signed)
Anesthesia Post Note  Patient: Katrina Wolfe  Procedure(s) Performed: DILATATION & CURETTAGE/HYSTEROSCOPY WITH MYOSURE and HYDROTHERMAL ABLATION (N/A Uterus)     Patient location during evaluation: PACU Anesthesia Type: General Level of consciousness: awake and alert Pain management: pain level controlled Vital Signs Assessment: post-procedure vital signs reviewed and stable Respiratory status: spontaneous breathing, nonlabored ventilation, respiratory function stable and patient connected to nasal cannula oxygen Cardiovascular status: blood pressure returned to baseline and stable Postop Assessment: no apparent nausea or vomiting Anesthetic complications: no    Last Vitals:  Vitals:   10/01/19 1000 10/01/19 1015  BP: (!) 165/96 (!) 173/104  Pulse: 81 78  Resp: 17 18  Temp:    SpO2: 100% 100%    Last Pain:  Vitals:   10/01/19 1015  TempSrc:   PainSc: 0-No pain                 Hadyn Azer S

## 2019-10-01 NOTE — Brief Op Note (Signed)
10/01/2019  9:48 AM  PATIENT:  Katrina Wolfe  53 y.o. female  PRE-OPERATIVE DIAGNOSIS:  N93.9 Abnormal uterine bleeding, Endometrial mass  POST-OPERATIVE DIAGNOSIS:  Fibroid, endometrial polyp  PROCEDURE:  Procedure(s) with comments: DILATATION & CURETTAGE/HYSTEROSCOPY WITH MYOSURE and HYDROTHERMAL ABLATION (N/A) - HTA rep will be here. Confirmed on 09/25/19 CS  SURGEON:  Surgeon(s) and Role:    Thurnell Lose, MD - Primary  PHYSICIAN ASSISTANT:   ASSISTANTS: none   ANESTHESIA:   local and general  EBL:  Less than 10 ml   BLOOD ADMINISTERED:none  DRAINS: none   LOCAL MEDICATIONS USED:  2% LIDOCAINE , Amount: 10 ml and OTHER Vasopressin 20/50  SPECIMEN:  Source of Specimen:  Endometrial polyp, Uterine fibroid, endometrial currettings  DISPOSITION OF SPECIMEN:  PATHOLOGY  COUNTS:  YES  TOURNIQUET:  * No tourniquets in log *  DICTATION: .Other Dictation: Dictation Number [id number]  PLAN OF CARE: Discharge to home after PACU  PATIENT DISPOSITION:  PACU - hemodynamically stable.   Delay start of Pharmacological VTE agent (>24hrs) due to surgical blood loss or risk of bleeding: not applicable

## 2019-10-01 NOTE — Addendum Note (Signed)
Addendum  created 10/01/19 1037 by Logon Uttech, Ernesta Amble, CRNA   Intraprocedure Meds edited

## 2019-10-01 NOTE — Op Note (Signed)
NAME: Katrina, Wolfe MEDICAL RECORD QJ:[id number] ACCOUNT [id number] DATE OF BIRTH:May 27, 1966 FACILITY: [facility] LOCATION: [facility] PHYSICIAN:Damin Salido Al Decant, MD  OPERATIVE REPORT  DATE OF PROCEDURE:  10/01/2019  PREOPERATIVE DIAGNOSES:  Abnormal uterine bleeding, endometrial mass.  POSTOPERATIVE DIAGNOSES:  Fibroid and endometrial polyp.  PROCEDURE:  Hysteroscopy, dilatation and curettage with MyoSure and endometrial ablation with hydrothermal ablation.  SURGEON:  Thurnell Lose, MD  ASSISTANT:  None.  ESTIMATED BLOOD LOSS:  Less than 10 mL.  Deficit is 1375 (significant amount on floor).  BLOOD ADMINISTERED:  None.  DRAINS:  None.  LOCAL:  Lidocaine 2%, and vasopressin 20 and 50, approximately 10 mL.  SPECIMEN:  Endometrial polyp, uterine fibroid. and endometrial curettings.  DISPOSITION OF SPECIMEN:  To pathology.  COUNTS CORRECT:  Yes.  DISPOSITION:  To PACU hemodynamically stable.  FINDINGS:  Pale endometrium, posterior mural fibroid, and endometrial polyp posteriorly at the lower uterine segment.  DESCRIPTION OF PROCEDURE:  The patient was identified in the holding area.  She was taken to the operating room with IV running.  She was placed in the dorsal lithotomy position, underwent anesthesia without complication.  SCDs were on her legs and  operating.  She was prepped and draped in a normal sterile fashion.  No antibiotics were administered.  Timeout was performed.  The patient has a SHELLFISH allergy, but has used Betadine successfully.  Attention was turned to the pelvis.  A bimanual exam was performed to reveal an anteverted uterus.  The Graves speculum was inserted into the vagina.  The cervix was visualized.  The anterior lip of the cervix was injected with lidocaine, and a  single-tooth tenaculum was applied.  A paracervical block was completed with the remainder to use 10 mL of 2% lidocaine.  Vasopressin then was injected for vasoconstriction.  The uterus  was easily dilated with an os finder.  I went up to a 7, but the  cervix was patulous.  The hysteroscope was then advanced.  There was a loss of a lot of water, so I applied a single-tooth tenaculum to the posterior cervix.  Initially when I went in, I could only see a fibroid, but when I came out to place a second  tenaculum, when we went back in, the polyp had sucked into the endocervical canal, so that was transected using the REACH MyoSure.  The remainder of the posterior fibroid was then resected using the REACH blade and a sampling of the uterus as well.   There was minimal bleeding at the time, but the fluid was getting murky but would clear easily.  The MyoSure was then removed.  The HTA device was then advanced.  When I went back in, there was a large clot in the fundus extending up to the anterior  wall.  The initial cavity test showed that there was no leakage.  We then went to ablation.  The clot would eventually obstruct the camera, but due to a steady position and the tenaculum stabilized, the scope was not advanced or moved.  Several times throughout, the kink in the system alert was noted, and that was after, I believe, 6 minutes.  The HTA rep was present and suspected it was due to the clot that was on the end of the device.  There was only 1 alert for cervical leakage.  We  were eventually able to proceed to a full 10 minutes of ablation.  Appropriate cool down was performed.  The HTA device was then removed, and I did a  gentle curettage and pictures were taken.  There was a good amount of clot, but with the images  afterwards, it appeared that there was an adequate treatment of the endometrium.  All instrument, sponge, and needle counts were correct x3.  The patient tolerated the procedure well and went to the operating room in stable condition.  LN/NUANCE  D:10/01/2019 T:10/01/2019 JOB:[id number]

## 2019-10-02 ENCOUNTER — Encounter (HOSPITAL_BASED_OUTPATIENT_CLINIC_OR_DEPARTMENT_OTHER): Payer: Self-pay | Admitting: Obstetrics and Gynecology

## 2019-10-02 LAB — SURGICAL PATHOLOGY

## 2019-10-13 ENCOUNTER — Ambulatory Visit
Admission: RE | Admit: 2019-10-13 | Discharge: 2019-10-13 | Disposition: A | Discharge status: Home / Self Care | Payer: Medicare Other | Source: Ambulatory Visit | Attending: Internal Medicine | Admitting: Internal Medicine

## 2019-10-13 ENCOUNTER — Other Ambulatory Visit: Payer: Self-pay

## 2019-10-13 DIAGNOSIS — Z1231 Encounter for screening mammogram for malignant neoplasm of breast: Secondary | ICD-10-CM

## 2019-10-13 IMAGING — MG DIGITAL SCREENING BILAT W/ TOMO W/ CAD
8 series · 8 of 24 positions shown · non-contrast
Comparison: Previous exam(s).

CLINICAL DATA: Screening.

EXAM:
DIGITAL SCREENING BILATERAL MAMMOGRAM WITH TOMO AND CAD

[R CC synth-2D]
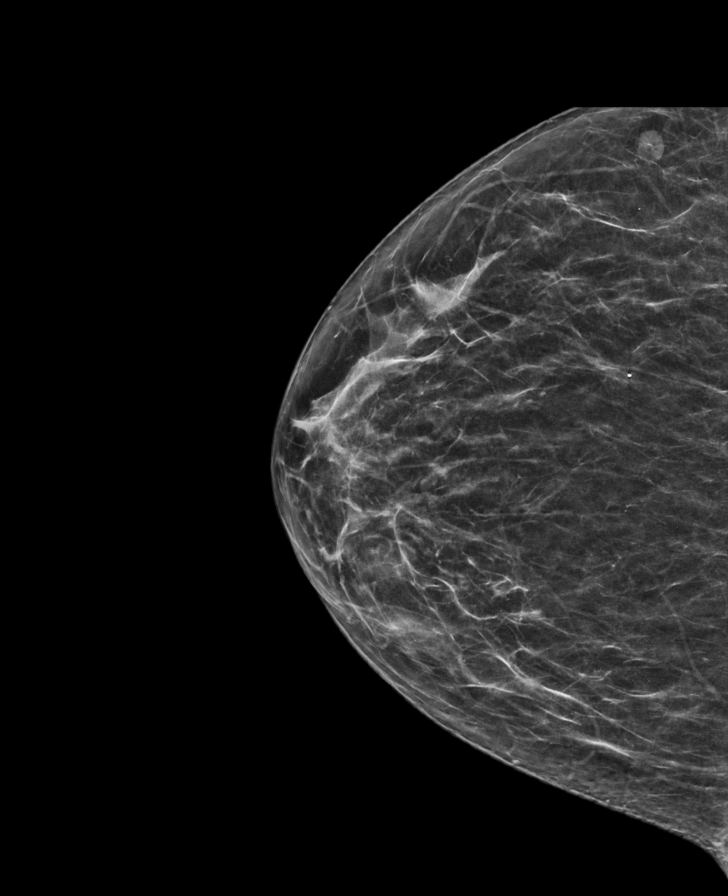

[R MLO synth-2D]
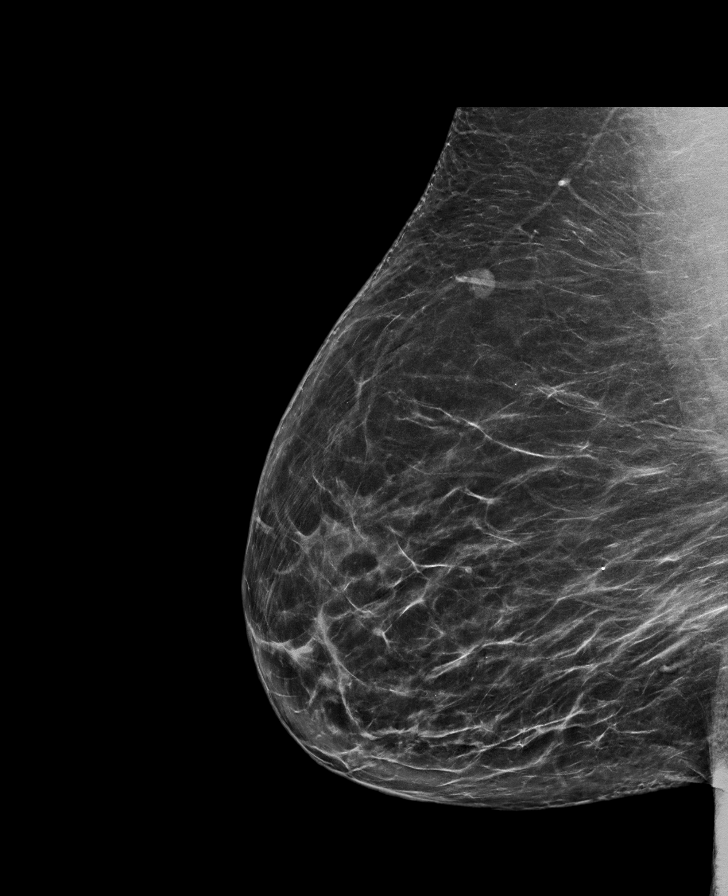

[L MLO synth-2D]
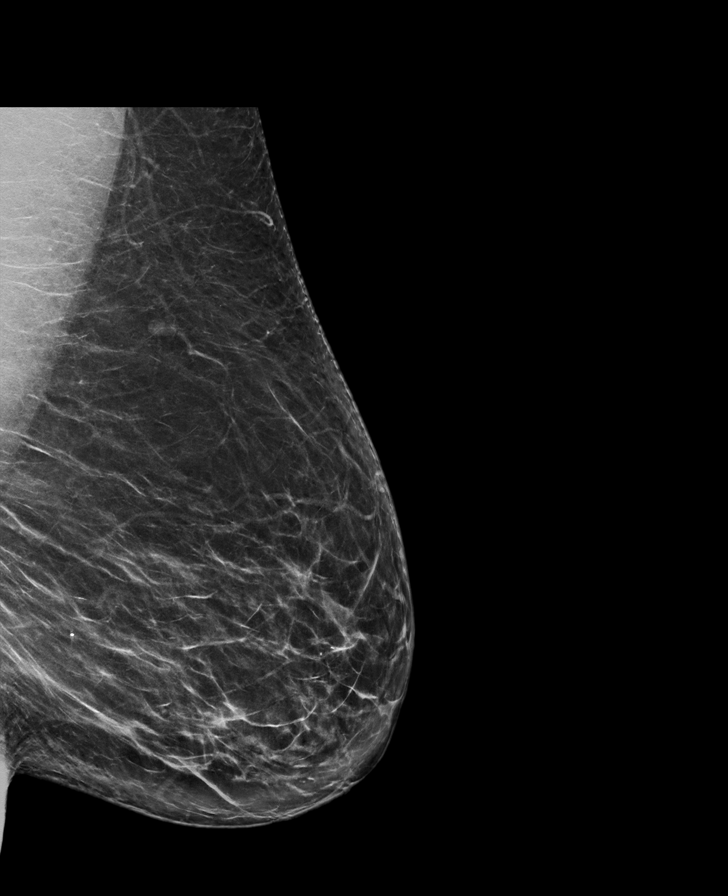

[L CC synth-2D]
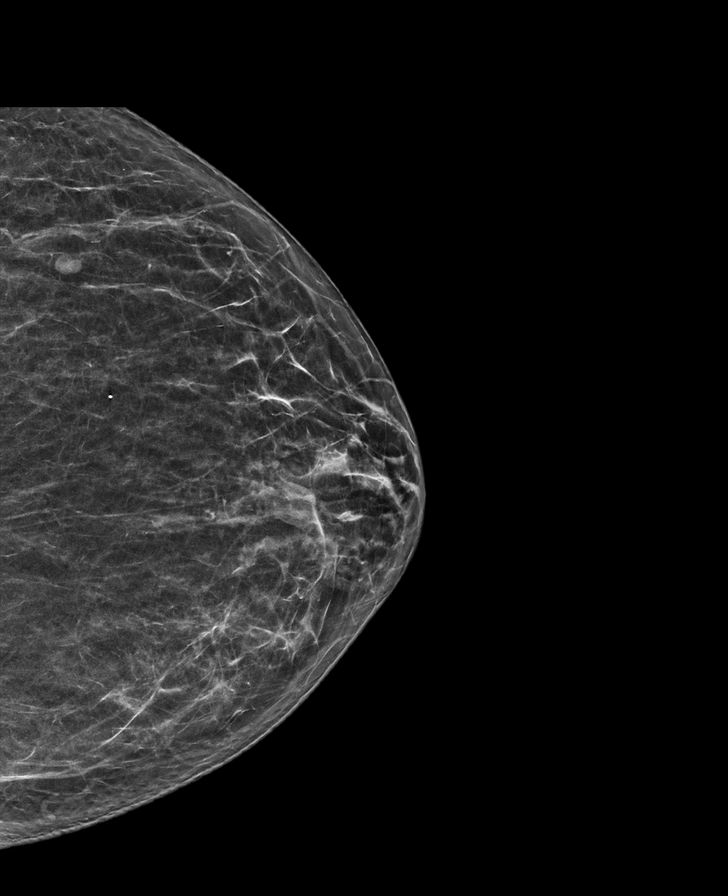

[L MLO tomo · tomo slice 41/81.0]
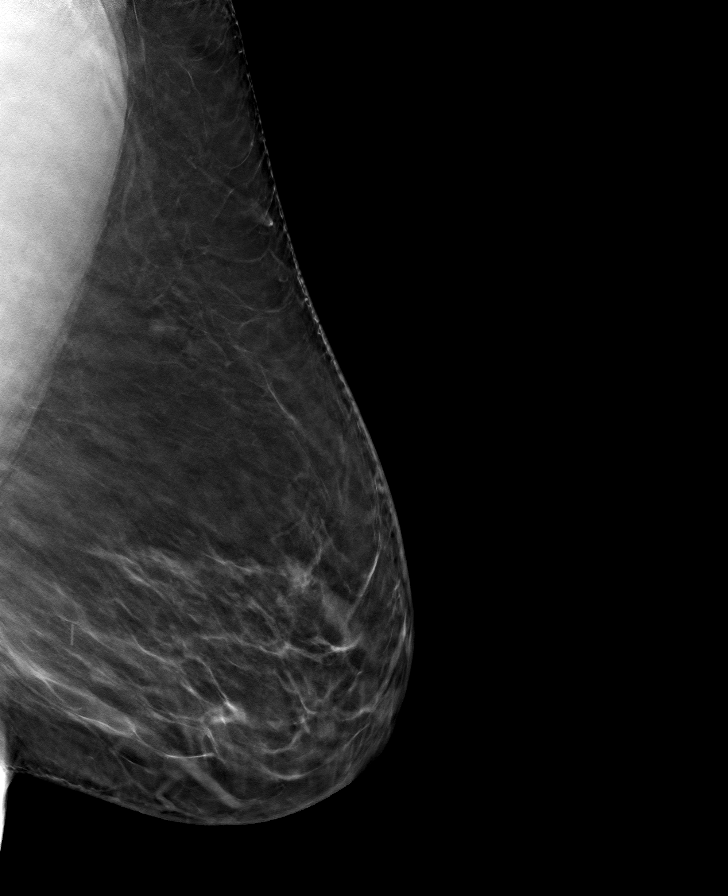

[R MLO tomo · tomo slice 40/79.0]
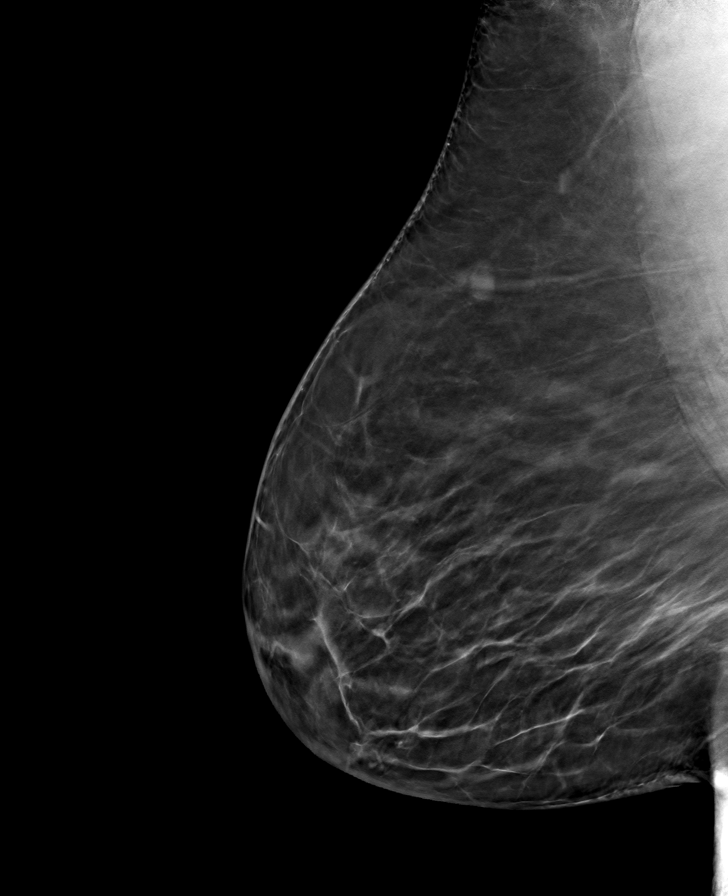

[L CC tomo · tomo slice 31/60.0]
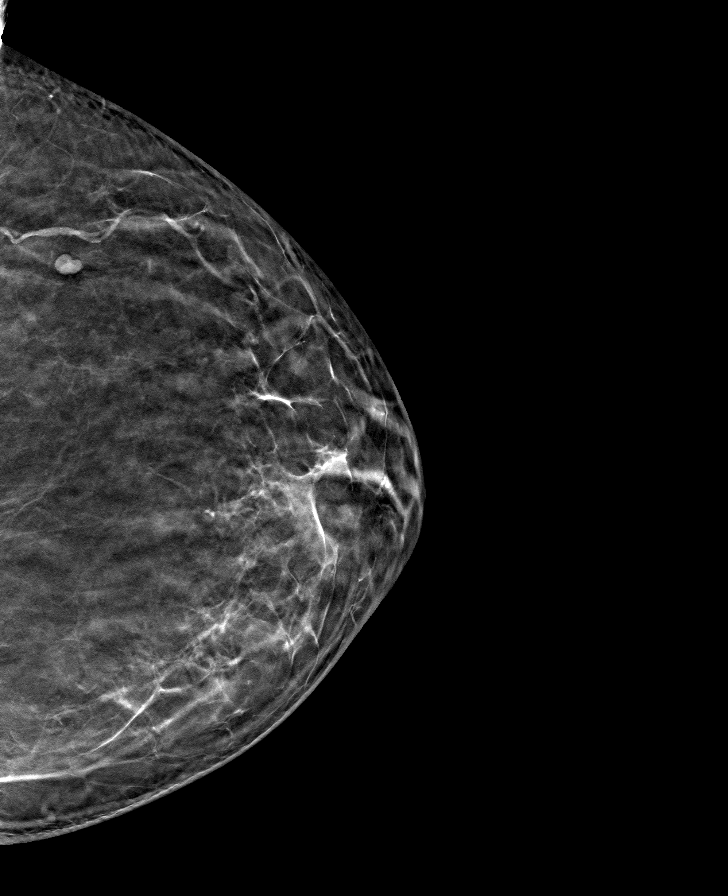

[R CC tomo · tomo slice 31/62.0]
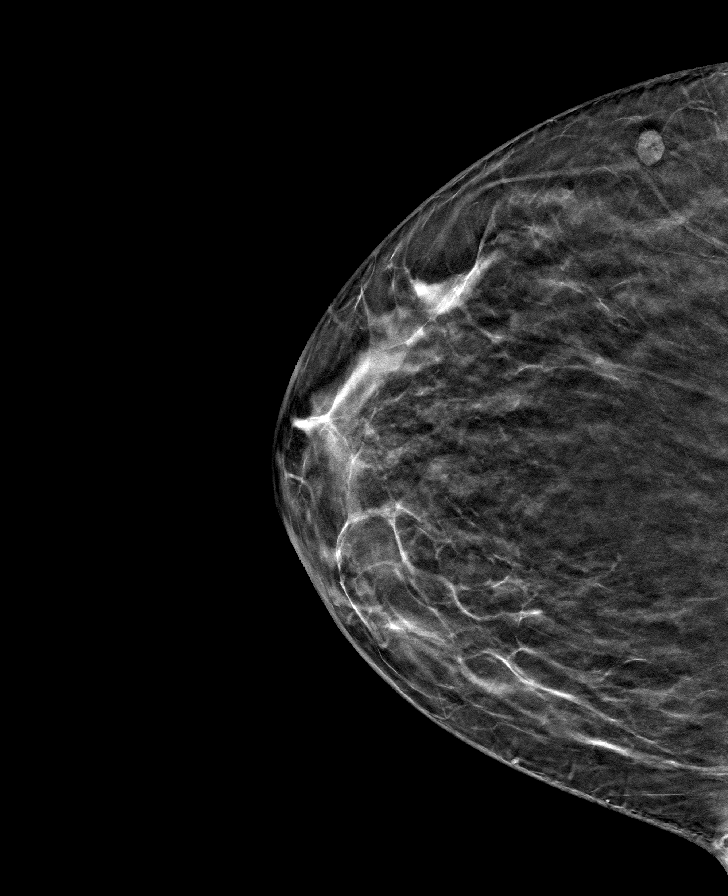

[8 of 24 positions shown; findings below may reference images not displayed]

ACR Breast Density Category b: There are scattered areas of
fibroglandular density.
FINDINGS: There are no findings suspicious for malignancy. Images were
processed with CAD.
IMPRESSION: No mammographic evidence of malignancy. A result letter of this
screening mammogram will be mailed directly to the patient.

RECOMMENDATION:
Screening mammogram in one year. (Code:[TQ])

BI-RADS CATEGORY  1: Negative.

## 2019-10-14 ENCOUNTER — Ambulatory Visit: Payer: Medicare Other | Admitting: Orthopedic Surgery

## 2019-10-23 ENCOUNTER — Ambulatory Visit (INDEPENDENT_AMBULATORY_CARE_PROVIDER_SITE_OTHER): Payer: Medicare Other | Admitting: Orthopedic Surgery

## 2019-10-23 ENCOUNTER — Encounter: Payer: Self-pay | Admitting: Orthopedic Surgery

## 2019-10-23 VITALS — Ht 69.0 in | Wt 276.0 lb

## 2019-10-23 DIAGNOSIS — L97521 Non-pressure chronic ulcer of other part of left foot limited to breakdown of skin: Secondary | ICD-10-CM | POA: Diagnosis not present

## 2019-10-23 DIAGNOSIS — T847XXS Infection and inflammatory reaction due to other internal orthopedic prosthetic devices, implants and grafts, sequela: Secondary | ICD-10-CM

## 2019-10-23 DIAGNOSIS — L97522 Non-pressure chronic ulcer of other part of left foot with fat layer exposed: Secondary | ICD-10-CM | POA: Diagnosis not present

## 2019-10-23 DIAGNOSIS — E11610 Type 2 diabetes mellitus with diabetic neuropathic arthropathy: Secondary | ICD-10-CM | POA: Diagnosis not present

## 2019-10-27 ENCOUNTER — Encounter: Payer: Self-pay | Admitting: Orthopedic Surgery

## 2019-10-27 NOTE — Progress Notes (Signed)
Office Visit Note   Patient: Katrina Wolfe           Date of Birth: Apr 17, 1966           MRN: [id number] Visit Date: 10/23/2019              Requested by: Wenda Low, MD [address] [place] [address] [place],  [place] [address] PCP: Wenda Low, MD  Chief Complaint  Patient presents with  . Left Foot - Follow-up    04/04/19 Removal of HDW 5th MT      HPI: Patient is a 53 year old woman who presents in follow-up for ulceration left foot with cavovarus deformity.  Patient is currently wearing a PRAFO to unload the ulcer.  Patient states she has had increased pain over the past 3 days.  Patient is currently wearing compression socks a PRAFO and has been using a zinc dressing change daily.  Assessment & Plan: Visit Diagnoses:  1. Ulcer of left foot, with fat layer exposed ([place])   2. Hardware complicating wound infection, sequela   3. Charcot's arthropathy associated with type 2 diabetes mellitus ([place])   4. Non-pressure chronic ulcer of other part of left foot limited to breakdown of skin ([place])     Plan: Ulcer was debrided back to healthy bleeding granulation tissue.  Continue with wound care continue with compression.  Follow-Up Instructions: Return in about 3 weeks (around 11/13/2019).   Ortho Exam  Patient is alert, oriented, no adenopathy, well-dressed, normal affect, normal respiratory effort. Examination patient has a fixed cavovarus deformity of the hindfoot.  She has an ulcer beneath the cuboid.  After informed consent a 10 blade knife was used to debride the skin and soft tissue back to bleeding viable granulation tissue this was touched with silver nitrate the ulcer is 3 x 4 cm and 5 mm deep.  Iodosorb 4 x 4 and Ace wrap was applied.  Imaging: No results found. No images are attached to the encounter.  Labs: Lab Results  Component Value Date   REPTSTATUS 04/08/2019 FINAL 04/04/2019   GRAMSTAIN  04/04/2019    FEW WBC PRESENT,BOTH PMN AND MONONUCLEAR NO  ORGANISMS SEEN    CULT  04/04/2019    FEW PSEUDOMONAS AERUGINOSA FEW SERRATIA MARCESCENS FEW STAPHYLOCOCCUS AUREUS CRITICAL RESULT CALLED TO, READ BACK BY AND VERIFIED WITH: RN Suzan Garibaldi [phone] FCP NO ANAEROBES ISOLATED Performed at [facility], [place] [address] [place], [place] [address]    LABORGA PSEUDOMONAS AERUGINOSA 04/04/2019   LABORGA SERRATIA MARCESCENS 04/04/2019   LABORGA STAPHYLOCOCCUS AUREUS 04/04/2019     Lab Results  Component Value Date   ALBUMIN 3.9 03/01/2019   ALBUMIN 3.5 (L) 05/24/2017   ALBUMIN 4.2 11/23/2016    No results found for: MG No results found for: VD25OH  No results found for: PREALBUMIN CBC EXTENDED Latest Ref Rng & Units 10/01/2019 04/04/2019 03/20/2019  WBC 4.0 - 10.5 K/uL - 9.7 10.8  RBC 3.87 - 5.11 MIL/uL - 3.54(L) 3.78(L)  HGB 12.0 - 15.0 g/dL 10.8(L) 10.6(L) 11.8  HCT 36.0 - 46.0 % - 34.0(L) 34.8(L)  PLT 150 - 400 K/uL - 412(H) 433(H)  NEUTROABS 1,500 - 7,800 cells/uL - - 7,420  LYMPHSABS 850 - 3,900 cells/uL - - 2,495     Body mass index is 40.76 kg/m.  Orders:  No orders of the defined types were placed in this encounter.  No orders of the defined types were placed in this encounter.    Procedures: No  procedures performed  Clinical Data: No additional findings.  ROS:  All other systems negative, except as noted in the HPI. Review of Systems  Objective: Vital Signs: Ht 5\' 9"  (1.753 m)   Wt 276 lb (125.2 kg)   BMI 40.76 kg/m   Specialty Comments:  No specialty comments available.  PMFS History: Patient Active Problem List   Diagnosis Date Noted  . Osteomyelitis of foot ([place]) 04/04/2019  . Hardware complicating wound infection ([place])   . Subacute osteomyelitis, left ankle and foot ([place])   . Open displaced fracture of fifth metatarsal bone of left foot   . Healthcare maintenance 04/03/2019  . HIV disease ([place]) 10/27/2015  . DM type 2 (diabetes mellitus, type 2) ([place]) 10/27/2015  . Hyperlipidemia  10/27/2015  . Essential hypertension 10/27/2015  . Severe vulvar dysplasia, histologically confirmed 10/15/2015   Past Medical History:  Diagnosis Date  . Abnormal uterine bleeding (AUB)   . Arthritis    knees, fingers  . Asthma    as child, no problems in 20 yrs  . Cancer ([place])   . CKD (chronic kidney disease), stage II   . Congenital cardiomegaly    per pt has always been told since childhood and siblings   . GERD (gastroesophageal reflux disease)   . History of genital warts   . HIV (human immunodeficiency virus infection) ([place])    [place]-- DR Carlyle Basques  . Hypertension   . Intermittent palpitations    mild-- no meds  . Legally blind in left eye, as defined in [place]    trauma as child  . Tuberculosis    ? 2005 or 2007  . Type 2 diabetes mellitus (H[facility])    Type II   . Uterine fibroid   . VIN III (vulvar intraepithelial neoplasia III)    and Verrucoid lesion on the mons  . Wears glasses     Family History  Problem Relation Age of Onset  . Hypertension Mother   . Diabetes Father   . Cancer Brother   . Breast cancer Cousin     Past Surgical History:  Procedure Laterality Date  . CESAREAN SECTION  2001  &  1984  . CO2 LASER APPLICATION N/A 25/0/0370   Procedure: CO2 LASER APPLICATION AND WIDE VOCAL EXCSION OF LESION ON MONS PUBIS;  Surgeon: Marti Sleigh, MD;  Location: [facility];  Service: Gynecology;  Laterality: N/A;   CASE CANCELLED   . CO2 LASER APPLICATION N/A 4/88/8916   Procedure: CO2 LASER OF VULVAR;  Surgeon: Marti Sleigh, MD;  Location: [facility];  Service: Gynecology;  Laterality: N/A;  . CORNEAL TRANSPLANT Left 2006   failed  . D & C HYSTEROSCOPY /  RESECTION FIBROID  2004  . DILATATION & CURETTAGE/HYSTEROSCOPY WITH MYOSURE N/A 10/01/2019   Procedure: DILATATION & CURETTAGE/HYSTEROSCOPY WITH MYOSURE and HYDROTHERMAL ABLATION;  Surgeon: Thurnell Lose, MD;  Location:  [facility];  Service: Gynecology;  Laterality: N/A;  HTA rep will be here. Confirmed on 09/25/19 CS  . HARDWARE REMOVAL Left 04/04/2019   Procedure: EXCISION BONE AND REMOVE DEEP HARDWARE LEFT 5TH METATARSAL;  Surgeon: Newt Minion, MD;  Location: [facility];  Service: Orthopedics;  Laterality: Left;  . KNEE ARTHROSCOPY W/ ACL RECONSTRUCTION Bilateral 2008  . ORIF TOE FRACTURE Left 08/02/2017   Procedure: OPEN REDUCTION INTERNAL FIXATION (ORIF) FIFTH METATARSAL (TOE) BASE FRACTURE;  Surgeon: Wylene Simmer, MD;  Location: [facility];  Service:  Orthopedics;  Laterality: Left;  Marland Kitchen VULVECTOMY N/A 01/07/2016   Procedure: WIDE LOCAL EXCISION;  Surgeon: Marti Sleigh, MD;  Location: [facility];  Service: Gynecology;  Laterality: N/A;  mons pubis as site   Social History   Occupational History  . Not on file  Tobacco Use  . Smoking status: Never Smoker  . Smokeless tobacco: Never Used  Substance and Sexual Activity  . Alcohol use: No    Alcohol/week: 0.0 standard drinks  . Drug use: No  . Sexual activity: Yes    Partners: Male    Birth control/protection: None, Condom    Comment: declined condoms - has at home

## 2019-11-10 ENCOUNTER — Encounter: Payer: Self-pay | Admitting: Orthopedic Surgery

## 2019-11-10 ENCOUNTER — Other Ambulatory Visit: Payer: Self-pay | Admitting: Physician Assistant

## 2019-11-10 ENCOUNTER — Ambulatory Visit (INDEPENDENT_AMBULATORY_CARE_PROVIDER_SITE_OTHER): Payer: Medicare Other | Admitting: Orthopedic Surgery

## 2019-11-10 ENCOUNTER — Other Ambulatory Visit: Payer: Self-pay

## 2019-11-10 VITALS — Ht 69.0 in | Wt 276.0 lb

## 2019-11-10 DIAGNOSIS — L97522 Non-pressure chronic ulcer of other part of left foot with fat layer exposed: Secondary | ICD-10-CM | POA: Diagnosis not present

## 2019-11-10 MED ORDER — DOXYCYCLINE HYCLATE 100 MG PO TABS: 100.0000 mg | ORAL_TABLET | Freq: Two times a day (BID) | ORAL | 0 refills | Status: DC

## 2019-11-10 NOTE — Progress Notes (Signed)
Office Visit Note   Patient: Katrina Wolfe           Date of Birth: [birth year]           MRN: [id number] Visit Date: 11/10/2019              Requested by: Wenda Low, MD [address] [place],  [place] [address] PCP: Wenda Low, MD  Chief Complaint  Patient presents with  . Left Foot - Follow-up    Left foot ulcer f/u      HPI: This is a pleasant woman who has been being followed for her slow healing left lateral foot ulcer.  She was last seen a couple weeks ago.  She was concerned because she began to have just a little bit more pain and drainage over the lateral wound and she thought it had a foul odor.  She otherwise feels well  Assessment & Plan: Visit Diagnoses: No diagnosis found. Plan: I will place her on a course of doxycycline we talked about taking a probiotic she will also do daily dressing changes with just a little bit of Silvadene she understands not to use any of the triple antibiotic ointment and not to allow too much moisture over the area  Follow-Up Instructions: No follow-ups on file.   Ortho Exam  Patient is alert, oriented, no adenopathy, well-dressed, normal affect, normal respiratory effort. Left foot: Over the proximal aspect of the lateral left foot there is a continued healing ulcer.  It does not probe deep at all.  Could only appreciate a very faint foul odor some of the surrounding skin was macerated  Imaging: No results found. No images are attached to the encounter.  Labs: Lab Results  Component Value Date   REPTSTATUS 04/08/2019 FINAL 04/04/2019   GRAMSTAIN  04/04/2019    FEW WBC PRESENT,BOTH PMN AND MONONUCLEAR NO ORGANISMS SEEN    CULT  04/04/2019    FEW PSEUDOMONAS AERUGINOSA FEW SERRATIA MARCESCENS FEW STAPHYLOCOCCUS AUREUS CRITICAL RESULT CALLED TO, READ BACK BY AND VERIFIED WITH: RN Suzan Garibaldi [phone] FCP NO ANAEROBES ISOLATED Performed at [facility], [place] [address], [place], [place] [address]   LABORGA PSEUDOMONAS AERUGINOSA 04/04/2019   LABORGA SERRATIA MARCESCENS 04/04/2019   LABORGA STAPHYLOCOCCUS AUREUS 04/04/2019     Lab Results  Component Value Date   ALBUMIN 3.9 03/01/2019   ALBUMIN 3.5 (L) 05/24/2017   ALBUMIN 4.2 11/23/2016    No results found for: MG No results found for: VD25OH  No results found for: PREALBUMIN CBC EXTENDED Latest Ref Rng & Units 10/01/2019 04/04/2019 03/20/2019  WBC 4.0 - 10.5 K/uL - 9.7 10.8  RBC 3.87 - 5.11 MIL/uL - 3.54(L) 3.78(L)  HGB 12.0 - 15.0 g/dL 10.8(L) 10.6(L) 11.8  HCT 36.0 - 46.0 % - 34.0(L) 34.8(L)  PLT 150 - 400 K/uL - 412(H) 433(H)  NEUTROABS 1,500 - 7,800 cells/uL - - 7,420  LYMPHSABS 850 - 3,900 cells/uL - - 2,495     Body mass index is 40.76 kg/m.  Orders:  No orders of the defined types were placed in this encounter.  No orders of the defined types were placed in this encounter.    Procedures: No procedures performed  Clinical Data: No additional findings.  ROS:  All other systems negative, except as noted in the HPI. Review of Systems  Objective: Vital Signs: Ht 5\' 9"  (1.753 m)   Wt 276 lb (125.2 kg)   BMI 40.76 kg/m   Specialty  Comments:  No specialty comments available.  PMFS History: Patient Active Problem List   Diagnosis Date Noted  . Osteomyelitis of foot ([place]) 04/04/2019  . Hardware complicating wound infection ([place])   . Subacute osteomyelitis, left ankle and foot ([place])   . Open displaced fracture of fifth metatarsal bone of left foot   . Healthcare maintenance 04/03/2019  . HIV disease ([place]) 10/27/2015  . DM type 2 (diabetes mellitus, type 2) ([place]) 10/27/2015  . Hyperlipidemia 10/27/2015  . Essential hypertension 10/27/2015  . Severe vulvar dysplasia, histologically confirmed 10/15/2015   Past Medical History:  Diagnosis Date  . Abnormal uterine bleeding (AUB)   . Arthritis    knees, fingers  . Asthma    as child, no problems in 20 yrs  . Cancer ([place])   . CKD (chronic  kidney disease), stage II   . Congenital cardiomegaly    per pt has always been told since childhood and siblings   . GERD (gastroesophageal reflux disease)   . History of genital warts   . HIV (human immunodeficiency virus infection) ([place])    [place]-- DR Carlyle Basques  . Hypertension   . Intermittent palpitations    mild-- no meds  . Legally blind in left eye, as defined in [place]    trauma as child  . Tuberculosis    ? 2005 or 2007  . Type 2 diabetes mellitus (HCC)    Type II   . Uterine fibroid   . VIN III (vulvar intraepithelial neoplasia III)    and Verrucoid lesion on the mons  . Wears glasses     Family History  Problem Relation Age of Onset  . Hypertension Mother   . Diabetes Father   . Cancer Brother   . Breast cancer Cousin     Past Surgical History:  Procedure Laterality Date  . CESAREAN SECTION  2001  &  1984  . CO2 LASER APPLICATION N/A 95/01/8412   Procedure: CO2 LASER APPLICATION AND WIDE VOCAL EXCSION OF LESION ON MONS PUBIS;  Surgeon: Marti Sleigh, MD;  Location: [facility];  Service: Gynecology;  Laterality: N/A;   CASE CANCELLED   . CO2 LASER APPLICATION N/A 2/44/0102   Procedure: CO2 LASER OF VULVAR;  Surgeon: Marti Sleigh, MD;  Location: [facility];  Service: Gynecology;  Laterality: N/A;  . CORNEAL TRANSPLANT Left 2006   failed  . D & C HYSTEROSCOPY /  RESECTION FIBROID  2004  . DILATATION & CURETTAGE/HYSTEROSCOPY WITH MYOSURE N/A 10/01/2019   Procedure: DILATATION & CURETTAGE/HYSTEROSCOPY WITH MYOSURE and HYDROTHERMAL ABLATION;  Surgeon: Thurnell Lose, MD;  Location: [facility];  Service: Gynecology;  Laterality: N/A;  HTA rep will be here. Confirmed on 09/25/19 CS  . HARDWARE REMOVAL Left 04/04/2019   Procedure: EXCISION BONE AND REMOVE DEEP HARDWARE LEFT 5TH METATARSAL;  Surgeon: Newt Minion, MD;  Location: [facility];  Service: Orthopedics;  Laterality:  Left;  . KNEE ARTHROSCOPY W/ ACL RECONSTRUCTION Bilateral 2008  . ORIF TOE FRACTURE Left 08/02/2017   Procedure: OPEN REDUCTION INTERNAL FIXATION (ORIF) FIFTH METATARSAL (TOE) BASE FRACTURE;  Surgeon: Wylene Simmer, MD;  Location: [facility];  Service: Orthopedics;  Laterality: Left;  Marland Kitchen VULVECTOMY N/A 01/07/2016   Procedure: WIDE LOCAL EXCISION;  Surgeon: Marti Sleigh, MD;  Location: [facility];  Service: Gynecology;  Laterality: N/A;  mons pubis as site   Social History   Occupational History  . Not  on file  Tobacco Use  . Smoking status: Never Smoker  . Smokeless tobacco: Never Used  Substance and Sexual Activity  . Alcohol use: No    Alcohol/week: 0.0 standard drinks  . Drug use: No  . Sexual activity: Yes    Partners: Male    Birth control/protection: None, Condom    Comment: declined condoms - has at home

## 2019-11-17 ENCOUNTER — Ambulatory Visit: Payer: Medicare Other | Admitting: Physician Assistant

## 2019-11-17 ENCOUNTER — Other Ambulatory Visit: Payer: Medicare Other

## 2019-11-17 ENCOUNTER — Other Ambulatory Visit: Payer: Self-pay

## 2019-11-17 DIAGNOSIS — B20 Human immunodeficiency virus [HIV] disease: Secondary | ICD-10-CM

## 2019-11-18 ENCOUNTER — Encounter: Payer: Self-pay | Admitting: Physician Assistant

## 2019-11-18 ENCOUNTER — Ambulatory Visit (INDEPENDENT_AMBULATORY_CARE_PROVIDER_SITE_OTHER): Payer: Medicare Other | Admitting: Physician Assistant

## 2019-11-18 VITALS — Ht 69.0 in | Wt 276.0 lb

## 2019-11-18 DIAGNOSIS — E11610 Type 2 diabetes mellitus with diabetic neuropathic arthropathy: Secondary | ICD-10-CM | POA: Diagnosis not present

## 2019-11-18 LAB — T-HELPER CELL (CD4) - (RCID CLINIC ONLY)
CD4 % Helper T Cell: 26 % — ABNORMAL LOW (ref 33–65)
CD4 T Cell Abs: 759 /uL (ref 400–1790)

## 2019-11-18 NOTE — Progress Notes (Signed)
Office Visit Note   Patient: Katrina Wolfe           Date of Birth: 1966-07-22           MRN: [id number] Visit Date: 11/18/2019              Requested by: Wenda Low, MD [address] [place],  [place] [address] PCP: Wenda Low, MD  Chief Complaint  Patient presents with  . Left Foot - Follow-up    Lateral foot ulcer       HPI: This is a pleasant 53 year old woman with a varus deformity of her left foot which predisposes her to skin breakdown and ulcers on the lateral side of her foot.  A week ago she was concerned because she felt her ulcer was having more drainage and had a slightly foul odor she has been doing daily dressing changes with Silvadene as well as taking doxycycline she has not noticed any foul odor and believes the drainage has decreased  Assessment & Plan: Visit Diagnoses: No diagnosis found.  Plan: She will finish doxycycline. I would like her to continue with dry dressing changes this week. Avoid WB in this area.   Follow-Up Instructions: No follow-ups on file.   Ortho Exam  Patient is alert, oriented, no adenopathy, well-dressed, normal affect, normal respiratory effort. Left foot: She has a also along the lateral aspect of her foot measuring about 5 mm x 6 mm it is about 1 mm deep and there is healthy granulation tissue there is some surrounding macerated edges but no cellulitis she is using her PRAFO.  After obtaining verbal consent I debrided the macerated skin around the wound edges to bleeding I used a silver nitrate to achieve hemostasis  Imaging: No results found. No images are attached to the encounter.  Labs: Lab Results  Component Value Date   REPTSTATUS 04/08/2019 FINAL 04/04/2019   GRAMSTAIN  04/04/2019    FEW WBC PRESENT,BOTH PMN AND MONONUCLEAR NO ORGANISMS SEEN    CULT  04/04/2019    FEW PSEUDOMONAS AERUGINOSA FEW SERRATIA MARCESCENS FEW STAPHYLOCOCCUS AUREUS CRITICAL RESULT CALLED TO, READ BACK BY AND VERIFIED  WITH: RN Suzan Garibaldi [phone] FCP NO ANAEROBES ISOLATED Performed at [facility], [place] [address], [place], [place] [address]    LABORGA PSEUDOMONAS AERUGINOSA 04/04/2019   LABORGA SERRATIA MARCESCENS 04/04/2019   LABORGA STAPHYLOCOCCUS AUREUS 04/04/2019     Lab Results  Component Value Date   ALBUMIN 3.9 03/01/2019   ALBUMIN 3.5 (L) 05/24/2017   ALBUMIN 4.2 11/23/2016    No results found for: MG No results found for: VD25OH  No results found for: PREALBUMIN CBC EXTENDED Latest Ref Rng & Units 11/17/2019 10/01/2019 04/04/2019  WBC 3.8 - 10.8 Thousand/uL 9.6 - 9.7  RBC 3.80 - 5.10 Million/uL 4.19 - 3.54(L)  HGB 11.7 - 15.5 g/dL 12.0 10.8(L) 10.6(L)  HCT 35.0 - 45.0 % 36.6 - 34.0(L)  PLT 140 - 400 Thousand/uL 446(H) - 412(H)  NEUTROABS 1,500 - 7,800 cells/uL 5,789 - -  LYMPHSABS 850 - 3,900 cells/uL 2,736 - -     Body mass index is 40.76 kg/m.  Orders:  No orders of the defined types were placed in this encounter.  No orders of the defined types were placed in this encounter.    Procedures: No procedures performed  Clinical Data: No additional findings.  ROS:  All other systems negative, except as noted in the HPI. Review of Systems  Objective: Vital  Signs: Ht 5\' 9"  (1.753 m)   Wt 276 lb (125.2 kg)   BMI 40.76 kg/m   Specialty Comments:  No specialty comments available.  PMFS History: Patient Active Problem List   Diagnosis Date Noted  . Osteomyelitis of foot ([place]) 04/04/2019  . Hardware complicating wound infection ([place])   . Subacute osteomyelitis, left ankle and foot ([place])   . Open displaced fracture of fifth metatarsal bone of left foot   . Healthcare maintenance 04/03/2019  . HIV disease ([place]) 10/27/2015  . DM type 2 (diabetes mellitus, type 2) ([place]) 10/27/2015  . Hyperlipidemia 10/27/2015  . Essential hypertension 10/27/2015  . Severe vulvar dysplasia, histologically confirmed 10/15/2015   Past Medical History:  Diagnosis Date  .  Abnormal uterine bleeding (AUB)   . Arthritis    knees, fingers  . Asthma    as child, no problems in 20 yrs  . Cancer ([place])   . CKD (chronic kidney disease), stage II   . Congenital cardiomegaly    per pt has always been told since childhood and siblings   . GERD (gastroesophageal reflux disease)   . History of genital warts   . HIV (human immunodeficiency virus infection) ([place])    [place]-- DR Carlyle Basques  . Hypertension   . Intermittent palpitations    mild-- no meds  . Legally blind in left eye, as defined in [place]    trauma as child  . Tuberculosis    ? 2005 or 2007  . Type 2 diabetes mellitus (HCC)    Type II   . Uterine fibroid   . VIN III (vulvar intraepithelial neoplasia III)    and Verrucoid lesion on the mons  . Wears glasses     Family History  Problem Relation Age of Onset  . Hypertension Mother   . Diabetes Father   . Cancer Brother   . Breast cancer Cousin     Past Surgical History:  Procedure Laterality Date  . CESAREAN SECTION  2001  &  1984  . CO2 LASER APPLICATION N/A 24/03/101   Procedure: CO2 LASER APPLICATION AND WIDE VOCAL EXCSION OF LESION ON MONS PUBIS;  Surgeon: Marti Sleigh, MD;  Location: [facility];  Service: Gynecology;  Laterality: N/A;   CASE CANCELLED   . CO2 LASER APPLICATION N/A 07/12/3663   Procedure: CO2 LASER OF VULVAR;  Surgeon: Marti Sleigh, MD;  Location: [facility];  Service: Gynecology;  Laterality: N/A;  . CORNEAL TRANSPLANT Left 2006   failed  . D & C HYSTEROSCOPY /  RESECTION FIBROID  2004  . DILATATION & CURETTAGE/HYSTEROSCOPY WITH MYOSURE N/A 10/01/2019   Procedure: DILATATION & CURETTAGE/HYSTEROSCOPY WITH MYOSURE and HYDROTHERMAL ABLATION;  Surgeon: Thurnell Lose, MD;  Location: [facility];  Service: Gynecology;  Laterality: N/A;  HTA rep will be here. Confirmed on 09/25/19 CS  . HARDWARE REMOVAL Left 04/04/2019    Procedure: EXCISION BONE AND REMOVE DEEP HARDWARE LEFT 5TH METATARSAL;  Surgeon: Newt Minion, MD;  Location: [facility];  Service: Orthopedics;  Laterality: Left;  . KNEE ARTHROSCOPY W/ ACL RECONSTRUCTION Bilateral 2008  . ORIF TOE FRACTURE Left 08/02/2017   Procedure: OPEN REDUCTION INTERNAL FIXATION (ORIF) FIFTH METATARSAL (TOE) BASE FRACTURE;  Surgeon: Wylene Simmer, MD;  Location: [facility];  Service: Orthopedics;  Laterality: Left;  Marland Kitchen VULVECTOMY N/A 01/07/2016   Procedure: WIDE LOCAL EXCISION;  Surgeon: Marti Sleigh, MD;  Location: [facility];  Service: Gynecology;  Laterality: N/A;  mons pubis as site   Social History   Occupational History  . Not on file  Tobacco Use  . Smoking status: Never Smoker  . Smokeless tobacco: Never Used  Substance and Sexual Activity  . Alcohol use: No    Alcohol/week: 0.0 standard drinks  . Drug use: No  . Sexual activity: Yes    Partners: Male    Birth control/protection: None, Condom    Comment: declined condoms - has at home

## 2019-11-24 ENCOUNTER — Encounter: Payer: Self-pay | Admitting: Physician Assistant

## 2019-11-24 ENCOUNTER — Ambulatory Visit (INDEPENDENT_AMBULATORY_CARE_PROVIDER_SITE_OTHER): Payer: Medicare Other | Admitting: Physician Assistant

## 2019-11-24 ENCOUNTER — Other Ambulatory Visit: Payer: Self-pay

## 2019-11-24 VITALS — Ht 69.0 in | Wt 276.0 lb

## 2019-11-24 DIAGNOSIS — E11610 Type 2 diabetes mellitus with diabetic neuropathic arthropathy: Secondary | ICD-10-CM

## 2019-11-24 NOTE — Progress Notes (Signed)
Office Visit Note   Patient: Katrina Wolfe           Date of Birth: [birth year]           MRN: [id number] Visit Date: 11/24/2019              Requested by: Wenda Low, MD [address] [address] [place],  [place] [address] PCP: Wenda Low, MD  Chief Complaint  Patient presents with  . Left Foot - Follow-up      HPI: This is a pleasant woman who is 8 months status post removal of deep hardware fifth metatarsal we have been following him closely as she developed a ulcer on the lateral side of her foot with some associated drainage and foul odor she reports she does not smell the odor anymore and she still has 3 more days of antibiotics she has been wearing a PRAFO boot  Assessment & Plan: Visit Diagnoses: No diagnosis found.  Plan: She is doing much better she will finish her antibiotics and follow-up in 2 weeks at that time possible bracing for her severe rigid varus foot can be discussed  Follow-Up Instructions: No follow-ups on file.   Ortho Exam  Patient is alert, oriented, no adenopathy, well-dressed, normal affect, normal respiratory effort. Left foot: No induration no fluctuance on the lateral side of the foot there is a small healing ulcer it does not probe more than a millimeter below the skin it is approximately 1 x 1-1/2 cm there is no foul odor  Imaging: No results found. No images are attached to the encounter.  Labs: Lab Results  Component Value Date   REPTSTATUS 04/08/2019 FINAL 04/04/2019   GRAMSTAIN  04/04/2019    FEW WBC PRESENT,BOTH PMN AND MONONUCLEAR NO ORGANISMS SEEN    CULT  04/04/2019    FEW PSEUDOMONAS AERUGINOSA FEW SERRATIA MARCESCENS FEW STAPHYLOCOCCUS AUREUS CRITICAL RESULT CALLED TO, READ BACK BY AND VERIFIED WITH: RN Suzan Garibaldi [phone] FCP NO ANAEROBES ISOLATED Performed at [facility], [place] [address], [place], [place] [address]    LABORGA PSEUDOMONAS AERUGINOSA 04/04/2019   LABORGA SERRATIA MARCESCENS 04/04/2019    LABORGA STAPHYLOCOCCUS AUREUS 04/04/2019     Lab Results  Component Value Date   ALBUMIN 3.9 03/01/2019   ALBUMIN 3.5 (L) 05/24/2017   ALBUMIN 4.2 11/23/2016    No results found for: MG No results found for: VD25OH  No results found for: PREALBUMIN CBC EXTENDED Latest Ref Rng & Units 11/17/2019 10/01/2019 04/04/2019  WBC 3.8 - 10.8 Thousand/uL 9.6 - 9.7  RBC 3.80 - 5.10 Million/uL 4.19 - 3.54(L)  HGB 11.7 - 15.5 g/dL 12.0 10.8(L) 10.6(L)  HCT 35.0 - 45.0 % 36.6 - 34.0(L)  PLT 140 - 400 Thousand/uL 446(H) - 412(H)  NEUTROABS 1,500 - 7,800 cells/uL 5,789 - -  LYMPHSABS 850 - 3,900 cells/uL 2,736 - -     Body mass index is 40.76 kg/m.  Orders:  No orders of the defined types were placed in this encounter.  No orders of the defined types were placed in this encounter.    Procedures: No procedures performed  Clinical Data: No additional findings.  ROS:  All other systems negative, except as noted in the HPI. Review of Systems  Objective: Vital Signs: Ht 5\' 9"  (1.753 m)   Wt 276 lb (125.2 kg)   BMI 40.76 kg/m   Specialty Comments:  No specialty comments available.  PMFS History: Patient Active Problem List   Diagnosis Date Noted  .  Osteomyelitis of foot ([place]) 04/04/2019  . Hardware complicating wound infection ([place])   . Subacute osteomyelitis, left ankle and foot ([place])   . Open displaced fracture of fifth metatarsal bone of left foot   . Healthcare maintenance 04/03/2019  . HIV disease ([place]) 10/27/2015  . DM type 2 (diabetes mellitus, type 2) ([place]) 10/27/2015  . Hyperlipidemia 10/27/2015  . Essential hypertension 10/27/2015  . Severe vulvar dysplasia, histologically confirmed 10/15/2015   Past Medical History:  Diagnosis Date  . Abnormal uterine bleeding (AUB)   . Arthritis    knees, fingers  . Asthma    as child, no problems in 20 yrs  . Cancer ([place])   . CKD (chronic kidney disease), stage II   . Congenital cardiomegaly    per pt has always  been told since childhood and siblings   . GERD (gastroesophageal reflux disease)   . History of genital warts   . HIV (human immunodeficiency virus infection) ([place])    [place]-- DR Carlyle Basques  . Hypertension   . Intermittent palpitations    mild-- no meds  . Legally blind in left eye, as defined in [place]    trauma as child  . Tuberculosis    ? 2005 or 2007  . Type 2 diabetes mellitus (HCC)    Type II   . Uterine fibroid   . VIN III (vulvar intraepithelial neoplasia III)    and Verrucoid lesion on the mons  . Wears glasses     Family History  Problem Relation Age of Onset  . Hypertension Mother   . Diabetes Father   . Cancer Brother   . Breast cancer Cousin     Past Surgical History:  Procedure Laterality Date  . CESAREAN SECTION  2001  &  1984  . CO2 LASER APPLICATION N/A 85/07/8501   Procedure: CO2 LASER APPLICATION AND WIDE VOCAL EXCSION OF LESION ON MONS PUBIS;  Surgeon: Marti Sleigh, MD;  Location: [facility];  Service: Gynecology;  Laterality: N/A;   CASE CANCELLED   . CO2 LASER APPLICATION N/A 7/74/1287   Procedure: CO2 LASER OF VULVAR;  Surgeon: Marti Sleigh, MD;  Location[facility];  Service: Gynecology;  Laterality: N/A;  . CORNEAL TRANSPLANT Left 2006   failed  . D & C HYSTEROSCOPY /  RESECTION FIBROID  2004  . DILATATION & CURETTAGE/HYSTEROSCOPY WITH MYOSURE N/A 10/01/2019   Procedure: DILATATION & CURETTAGE/HYSTEROSCOPY WITH MYOSURE and HYDROTHERMAL ABLATION;  Surgeon: Thurnell Lose, MD;  Location: [facility];  Service: Gynecology;  Laterality: N/A;  HTA rep will be here. Confirmed on 09/25/19 CS  . HARDWARE REMOVAL Left 04/04/2019   Procedure: EXCISION BONE AND REMOVE DEEP HARDWARE LEFT 5TH METATARSAL;  Surgeon: Newt Minion, MD;  Location: [facility];  Service: Orthopedics;  Laterality: Left;  . KNEE ARTHROSCOPY W/ ACL RECONSTRUCTION Bilateral 2008  . ORIF TOE  FRACTURE Left 08/02/2017   Procedure: OPEN REDUCTION INTERNAL FIXATION (ORIF) FIFTH METATARSAL (TOE) BASE FRACTURE;  Surgeon: Wylene Simmer, MD;  Location: [facility];  Service: Orthopedics;  Laterality: Left;  Marland Kitchen VULVECTOMY N/A 01/07/2016   Procedure: WIDE LOCAL EXCISION;  Surgeon: Marti Sleigh, MD;  Location: [facility];  Service: Gynecology;  Laterality: N/A;  mons pubis as site   Social History   Occupational History  . Not on file  Tobacco Use  . Smoking status: Never Smoker  . Smokeless tobacco: Never Used  Substance and  Sexual Activity  . Alcohol use: No    Alcohol/week: 0.0 standard drinks  . Drug use: No  . Sexual activity: Yes    Partners: Male    Birth control/protection: None, Condom    Comment: declined condoms - has at home

## 2019-11-25 LAB — COMPLETE METABOLIC PANEL WITH GFR
AG Ratio: 1 (calc) (ref 1.0–2.5)
ALT: 11 U/L (ref 6–29)
AST: 12 U/L (ref 10–35)
Albumin: 3.5 g/dL — ABNORMAL LOW (ref 3.6–5.1)
Alkaline phosphatase (APISO): 105 U/L (ref 37–153)
BUN/Creatinine Ratio: 13 (calc) (ref 6–22)
BUN: 14 mg/dL (ref 7–25)
CO2: 23 mmol/L (ref 20–32)
Calcium: 9.3 mg/dL (ref 8.6–10.4)
Chloride: 108 mmol/L (ref 98–110)
Creat: 1.09 mg/dL — ABNORMAL HIGH (ref 0.50–1.05)
GFR, Est African American: 67 mL/min/{1.73_m2} (ref >=60)
GFR, Est Non African American: 58 mL/min/{1.73_m2} — ABNORMAL LOW (ref >=60)
Globulin: 3.4 g/dL (calc) (ref 1.9–3.7)
Glucose, Bld: 137 mg/dL — ABNORMAL HIGH (ref 65–99)
Potassium: 4.3 mmol/L (ref 3.5–5.3)
Sodium: 139 mmol/L (ref 135–146)
Total Bilirubin: 0.2 mg/dL (ref 0.2–1.2)
Total Protein: 6.9 g/dL (ref 6.1–8.1)

## 2019-11-25 LAB — CBC WITH DIFFERENTIAL/PLATELET
Absolute Monocytes: 797 cells/uL (ref 200–950)
Basophils Absolute: 29 cells/uL (ref 0–200)
Basophils Relative: 0.3 %
Eosinophils Absolute: 250 cells/uL (ref 15–500)
Eosinophils Relative: 2.6 %
HCT: 36.6 % (ref 35.0–45.0)
Hemoglobin: 12 g/dL (ref 11.7–15.5)
Lymphs Abs: 2736 cells/uL (ref 850–3900)
MCH: 28.6 pg (ref 27.0–33.0)
MCHC: 32.8 g/dL (ref 32.0–36.0)
MCV: 87.4 fL (ref 80.0–100.0)
MPV: 10.2 fL (ref 7.5–12.5)
Monocytes Relative: 8.3 %
Neutro Abs: 5789 cells/uL (ref 1500–7800)
Neutrophils Relative %: 60.3 %
Platelets: 446 10*3/uL — ABNORMAL HIGH (ref 140–400)
RBC: 4.19 10*6/uL (ref 3.80–5.10)
RDW: 13.8 % (ref 11.0–15.0)
Total Lymphocyte: 28.5 %
WBC: 9.6 10*3/uL (ref 3.8–10.8)

## 2019-11-25 LAB — HIV-1 RNA QUANT-NO REFLEX-BLD
HIV 1 RNA Quant: 29 copies/mL — ABNORMAL HIGH
HIV-1 RNA Quant, Log: 1.46 Log copies/mL — ABNORMAL HIGH

## 2019-12-01 ENCOUNTER — Ambulatory Visit (INDEPENDENT_AMBULATORY_CARE_PROVIDER_SITE_OTHER): Payer: Medicare Other | Admitting: Internal Medicine

## 2019-12-01 ENCOUNTER — Other Ambulatory Visit: Payer: Self-pay

## 2019-12-01 ENCOUNTER — Encounter: Payer: Self-pay | Admitting: Internal Medicine

## 2019-12-01 VITALS — BP 147/88 | HR 72 | Temp 98.0°F | Wt 269.0 lb

## 2019-12-01 DIAGNOSIS — B20 Human immunodeficiency virus [HIV] disease: Secondary | ICD-10-CM

## 2019-12-01 DIAGNOSIS — L97509 Non-pressure chronic ulcer of other part of unspecified foot with unspecified severity: Secondary | ICD-10-CM

## 2019-12-01 DIAGNOSIS — E11621 Type 2 diabetes mellitus with foot ulcer: Secondary | ICD-10-CM | POA: Diagnosis not present

## 2019-12-01 DIAGNOSIS — Z79899 Other long term (current) drug therapy: Secondary | ICD-10-CM | POA: Diagnosis not present

## 2019-12-01 NOTE — Progress Notes (Signed)
RFV: follow up for hiv disease  Patient ID: Katrina Wolfe, female   DOB: Oct 08, 1966, 53 y.o.   MRN: [id number]  HPI CD 4 count of 759/VL 29 (11/17/19) doing well on biktarvy. Has also IDDM. Has left foot dfu, on doxy, cared by dr duda No fever/chills.no Nightsweats. Stays at home. No covid exposure. Has been taking biktarvy daily.   Outpatient Encounter Medications as of 12/01/2019  Medication Sig  . acetaminophen (TYLENOL) 500 MG tablet Take 2 tablets (1,000 mg total) by mouth every 8 (eight) hours as needed for moderate pain.  Marland Kitchen aspirin EC 81 MG tablet Take 1 tablet (81 mg total) by mouth 2 (two) times daily.  Marland Kitchen aspirin-acetaminophen-caffeine (EXCEDRIN MIGRAINE) 250-250-65 MG tablet Take 2 tablets by mouth daily as needed for headache or migraine.  Marland Kitchen atorvastatin (LIPITOR) 10 MG tablet Take 10 mg by mouth every morning.   Marland Kitchen atropine 1 % ophthalmic solution Place 1 drop into both eyes 2 (two) times daily.  Marland Kitchen BIKTARVY 50-200-25 MG TABS tablet TAKE ONE TABLET BY MOUTH EVERY EVENING WITH DINNER  . calcium carbonate (TUMS - DOSED IN MG ELEMENTAL CALCIUM) 500 MG chewable tablet Chew 1 tablet by mouth as needed for indigestion or heartburn.  . cloNIDine (CATAPRES) 0.3 MG tablet Take 0.3 mg by mouth 2 (two) times daily.   Marland Kitchen gabapentin (NEURONTIN) 100 MG capsule Take 100 mg by mouth 3 (three) times daily.  Marland Kitchen HUMALOG MIX 75/25 KWIKPEN (75-25) 100 UNIT/ML Kwikpen Inject 56 Units into the skin 2 (two) times daily.   . Insulin Syringe-Needle U-100 (INSULIN SYRINGE 1CC/31GX5/16") 31G X 5/16" 1 ML MISC 1 Units by Does not apply route 2 (two) times daily.  Marland Kitchen JARDIANCE 10 MG TABS tablet Take 10 mg by mouth every morning.  . Lancets (ONETOUCH ULTRASOFT) lancets   . lisinopril (PRINIVIL,ZESTRIL) 10 MG tablet Take 1 tablet (10 mg total) by mouth daily.  . ONE TOUCH ULTRA TEST test strip   . VICTOZA 18 MG/3ML SOPN Inject 1.8 mg into the skin every morning.   Marland Kitchen doxycycline (VIBRA-TABS) 100 MG tablet Take  1 tablet (100 mg total) by mouth 2 (two) times daily.  Marland Kitchen glimepiride (AMARYL) 2 MG tablet Take 2 mg by mouth daily.  Marland Kitchen lisinopril-hydrochlorothiazide (PRINZIDE,ZESTORETIC) 20-25 MG tablet Take 1 tablet by mouth daily.  . metFORMIN (GLUCOPHAGE) 1000 MG tablet Take 1,000 mg by mouth daily with breakfast.    No facility-administered encounter medications on file as of 12/01/2019.     Patient Active Problem List   Diagnosis Date Noted  . Osteomyelitis of foot ([place]) 04/04/2019  . Hardware complicating wound infection ([place])   . Subacute osteomyelitis, left ankle and foot ([place])   . Open displaced fracture of fifth metatarsal bone of left foot   . Healthcare maintenance 04/03/2019  . HIV disease ([place]) 10/27/2015  . DM type 2 (diabetes mellitus, type 2) ([place]) 10/27/2015  . Hyperlipidemia 10/27/2015  . Essential hypertension 10/27/2015  . Severe vulvar dysplasia, histologically confirmed 10/15/2015     Health Maintenance Due  Topic Date Due  . HEMOGLOBIN A1C  1965/12/29  . FOOT EXAM  08/16/1976  . OPHTHALMOLOGY EXAM  08/16/1976  . COLONOSCOPY  08/16/2016  . INFLUENZA VACCINE  07/19/2019    Social History   Tobacco Use  . Smoking status: Never Smoker  . Smokeless tobacco: Never Used  Substance Use Topics  . Alcohol use: No    Alcohol/week: 0.0 standard drinks  . Drug use: No   Review of Systems  Review of Systems  Constitutional: Negative for fever, chills, diaphoresis, activity change, appetite change, fatigue and unexpected weight change.  HENT: Negative for congestion, sore throat, rhinorrhea, sneezing, trouble swallowing and sinus pressure.  Eyes: Negative for photophobia and visual disturbance.  Respiratory: Negative for cough, chest tightness, shortness of breath, wheezing and stridor.  Cardiovascular: Negative for chest pain, palpitations and leg swelling.  Gastrointestinal: Negative for nausea, vomiting, abdominal pain, diarrhea, constipation, blood in stool, abdominal  distention and anal bleeding.  Genitourinary: Negative for dysuria, hematuria, flank pain and difficulty urinating.  Musculoskeletal: Negative for myalgias, back pain, joint swelling, arthralgias and gait problem.  Skin: Negative for color change, pallor, rash and wound.  Neurological: Negative for dizziness, tremors, weakness and light-headedness.  Hematological: Negative for adenopathy. Does not bruise/bleed easily.  Psychiatric/Behavioral: Negative for behavioral problems, confusion, sleep disturbance, dysphoric mood, decreased concentration and agitation.    Physical Exam   BP (!) 147/88   Pulse 72   Temp 98 F (36.7 C) (Oral)   Wt 269 lb (122 kg)   BMI 39.72 kg/m   Physical Exam  Constitutional:  oriented to person, place, and time. appears well-developed and well-nourished. No distress.  HENT: [place]/AT, PERRLA, no scleral icterus Mouth/Throat: Oropharynx is clear and moist. No oropharyngeal exudate.  Cardiovascular: Normal rate, regular rhythm and normal heart sounds. Exam reveals no gallop and no friction rub.  No murmur heard.  Pulmonary/Chest: Effort normal and breath sounds normal. No respiratory distress.  has no wheezes.  Neck = supple, no nuchal rigidity Abdominal: Soft. Bowel sounds are normal.  exhibits no distension. There is no tenderness.  Lymphadenopathy: no cervical adenopathy. No axillary adenopathy Neurological: alert and oriented to person, place, and time.  Skin: Skin is warm and dry. No rash noted. No erythema. Left foot in boot Psychiatric: a normal mood and affect.  behavior is normal.   Lab Results  Component Value Date   CD4TCELL 26 (L) 11/17/2019   Lab Results  Component Value Date   CD4TABS 759 11/17/2019   CD4TABS 605 08/04/2019   CD4TABS 590 03/20/2019   Lab Results  Component Value Date   HIV1RNAQUANT 29 (H) 11/17/2019   Lab Results  Component Value Date   HEPBSAB NEG 10/13/2015   Lab Results  Component Value Date   LABRPR  NON-REACTIVE 03/20/2019    CBC Lab Results  Component Value Date   WBC 9.6 11/17/2019   RBC 4.19 11/17/2019   HGB 12.0 11/17/2019   HCT 36.6 11/17/2019   PLT 446 (H) 11/17/2019   MCV 87.4 11/17/2019   MCH 28.6 11/17/2019   MCHC 32.8 11/17/2019   RDW 13.8 11/17/2019   LYMPHSABS 2,736 11/17/2019   MONOABS 1.0 03/01/2019   EOSABS 250 11/17/2019    BMET Lab Results  Component Value Date   NA 139 11/17/2019   K 4.3 11/17/2019   CL 108 11/17/2019   CO2 23 11/17/2019   GLUCOSE 137 (H) 11/17/2019   BUN 14 11/17/2019   CREATININE 1.09 (H) 11/17/2019   CALCIUM 9.3 11/17/2019   GFRNONAA 58 (L) 11/17/2019   GFRAA 67 11/17/2019      Assessment and Plan  hiv disease = well controlled, continue on biktarvy  dfu = continue on doxycycline. Follow up with dr duda. Wound recs per dr duda  Long term management medication= cr stable  Declined flu shot

## 2019-12-08 ENCOUNTER — Other Ambulatory Visit: Payer: Self-pay

## 2019-12-08 ENCOUNTER — Encounter: Payer: Self-pay | Admitting: Physician Assistant

## 2019-12-08 ENCOUNTER — Ambulatory Visit (INDEPENDENT_AMBULATORY_CARE_PROVIDER_SITE_OTHER): Payer: Medicare Other | Admitting: Physician Assistant

## 2019-12-08 VITALS — Ht 69.0 in | Wt 269.0 lb

## 2019-12-08 DIAGNOSIS — E11610 Type 2 diabetes mellitus with diabetic neuropathic arthropathy: Secondary | ICD-10-CM

## 2019-12-08 NOTE — Progress Notes (Signed)
Office Visit Note   Patient: Katrina Wolfe           Date of Birth: June 16, 1966           MRN: [id number] Visit Date: 12/08/2019              Requested by: Wenda Low, MD [address] [place] [address] [place],  [place] [address] PCP: Wenda Low, MD  Chief Complaint  Patient presents with  . Left Foot - Follow-up      HPI: The patient presents in follow-up today she is status post excision of bone and hardware removal fifth metatarsal she has completed her antibiotics she feels like the wound on the lateral side of her foot is closing up she is using Iodosorb and a dressing  Assessment & Plan: Visit Diagnoses: No diagnosis found.  Plan: She is doing quite well and is improved she will follow up in 2 weeks  Follow-Up Instructions: No follow-ups on file.   Ortho Exam  Patient is alert, oriented, no adenopathy, well-dressed, normal affect, normal respiratory effort. Left foot: No cellulitis no foul odor she has a small 2 x 2 ulcer which is very superficial at this point does not probe deeply after obtaining verbal consent this was debrided to fresh healthy pink skin  Imaging: No results found. No images are attached to the encounter.  Labs: Lab Results  Component Value Date   REPTSTATUS 04/08/2019 FINAL 04/04/2019   GRAMSTAIN  04/04/2019    FEW WBC PRESENT,BOTH PMN AND MONONUCLEAR NO ORGANISMS SEEN    CULT  04/04/2019    FEW PSEUDOMONAS AERUGINOSA FEW SERRATIA MARCESCENS FEW STAPHYLOCOCCUS AUREUS CRITICAL RESULT CALLED TO, READ BACK BY AND VERIFIED WITH: RN Suzan Garibaldi [phone] FCP NO ANAEROBES ISOLATED Performed at [facility], [place] [address] [place], [place] [address]    LABORGA PSEUDOMONAS AERUGINOSA 04/04/2019   LABORGA SERRATIA MARCESCENS 04/04/2019   LABORGA STAPHYLOCOCCUS AUREUS 04/04/2019     Lab Results  Component Value Date   ALBUMIN 3.9 03/01/2019   ALBUMIN 3.5 (L) 05/24/2017   ALBUMIN 4.2 11/23/2016    No results found for:  MG No results found for: VD25OH  No results found for: PREALBUMIN CBC EXTENDED Latest Ref Rng & Units 11/17/2019 10/01/2019 04/04/2019  WBC 3.8 - 10.8 Thousand/uL 9.6 - 9.7  RBC 3.80 - 5.10 Million/uL 4.19 - 3.54(L)  HGB 11.7 - 15.5 g/dL 12.0 10.8(L) 10.6(L)  HCT 35.0 - 45.0 % 36.6 - 34.0(L)  PLT 140 - 400 Thousand/uL 446(H) - 412(H)  NEUTROABS 1,500 - 7,800 cells/uL 5,789 - -  LYMPHSABS 850 - 3,900 cells/uL 2,736 - -     Body mass index is 39.72 kg/m.  Orders:  No orders of the defined types were placed in this encounter.  No orders of the defined types were placed in this encounter.    Procedures: No procedures performed  Clinical Data: No additional findings.  ROS:  All other systems negative, except as noted in the HPI. Review of Systems  Objective: Vital Signs: Ht 5\' 9"  (1.753 m)   Wt 269 lb (122 kg)   BMI 39.72 kg/m   Specialty Comments:  No specialty comments available.  PMFS History: Patient Active Problem List   Diagnosis Date Noted  . Osteomyelitis of foot ([place]) 04/04/2019  . Hardware complicating wound infection ([place])   . Subacute osteomyelitis, left ankle and foot ([place])   . Open displaced fracture of fifth metatarsal bone of left foot   .  Healthcare maintenance 04/03/2019  . HIV disease ([place]) 10/27/2015  . DM type 2 (diabetes mellitus, type 2) ([place]) 10/27/2015  . Hyperlipidemia 10/27/2015  . Essential hypertension 10/27/2015  . Severe vulvar dysplasia, histologically confirmed 10/15/2015   Past Medical History:  Diagnosis Date  . Abnormal uterine bleeding (AUB)   . Arthritis    knees, fingers  . Asthma    as child, no problems in 20 yrs  . Cancer ([place])   . CKD (chronic kidney disease), stage II   . Congenital cardiomegaly    per pt has always been told since childhood and siblings   . GERD (gastroesophageal reflux disease)   . History of genital warts   . HIV (human immunodeficiency virus infection) ([place])    [place]-- DR Carlyle Basques  . Hypertension   . Intermittent palpitations    mild-- no meds  . Legally blind in left eye, as defined in [place]    trauma as child  . Tuberculosis    ? 2005 or 2007  . Type 2 diabetes mellitus (HCC)    Type II   . Uterine fibroid   . VIN III (vulvar intraepithelial neoplasia III)    and Verrucoid lesion on the mons  . Wears glasses     Family History  Problem Relation Age of Onset  . Hypertension Mother   . Diabetes Father   . Cancer Brother   . Breast cancer Cousin     Past Surgical History:  Procedure Laterality Date  . CESAREAN SECTION  2001  &  1984  . CO2 LASER APPLICATION N/A 67/07/9380   Procedure: CO2 LASER APPLICATION AND WIDE VOCAL EXCSION OF LESION ON MONS PUBIS;  Surgeon: Marti Sleigh, MD;  Location: [facility];  Service: Gynecology;  Laterality: N/A;   CASE CANCELLED   . CO2 LASER APPLICATION N/A 0/17/5102   Procedure: CO2 LASER OF VULVAR;  Surgeon: Marti Sleigh, MD;  Location: [facility];  Service: Gynecology;  Laterality: N/A;  . CORNEAL TRANSPLANT Left 2006   failed  . D & C HYSTEROSCOPY /  RESECTION FIBROID  2004  . DILATATION & CURETTAGE/HYSTEROSCOPY WITH MYOSURE N/A 10/01/2019   Procedure: DILATATION & CURETTAGE/HYSTEROSCOPY WITH MYOSURE and HYDROTHERMAL ABLATION;  Surgeon: Thurnell Lose, MD;  Location: [facility];  Service: Gynecology;  Laterality: N/A;  HTA rep will be here. Confirmed on 09/25/19 CS  . HARDWARE REMOVAL Left 04/04/2019   Procedure: EXCISION BONE AND REMOVE DEEP HARDWARE LEFT 5TH METATARSAL;  Surgeon: Newt Minion, MD;  Location: [facility];  Service: Orthopedics;  Laterality: Left;  . KNEE ARTHROSCOPY W/ ACL RECONSTRUCTION Bilateral 2008  . ORIF TOE FRACTURE Left 08/02/2017   Procedure: OPEN REDUCTION INTERNAL FIXATION (ORIF) FIFTH METATARSAL (TOE) BASE FRACTURE;  Surgeon: Wylene Simmer, MD;  Location: [facility];  Service:  Orthopedics;  Laterality: Left;  Marland Kitchen VULVECTOMY N/A 01/07/2016   Procedure: WIDE LOCAL EXCISION;  Surgeon: Marti Sleigh, MD;  Location: [facility];  Service: Gynecology;  Laterality: N/A;  mons pubis as site   Social History   Occupational History  . Not on file  Tobacco Use  . Smoking status: Never Smoker  . Smokeless tobacco: Never Used  Substance and Sexual Activity  . Alcohol use: No    Alcohol/week: 0.0 standard drinks  . Drug use: No  . Sexual activity: Yes    Partners: Male    Birth control/protection: None, Condom  Comment: declined condoms - has at home

## 2019-12-29 ENCOUNTER — Other Ambulatory Visit: Payer: Self-pay

## 2019-12-29 ENCOUNTER — Ambulatory Visit (INDEPENDENT_AMBULATORY_CARE_PROVIDER_SITE_OTHER): Payer: Medicare Other | Admitting: Physician Assistant

## 2019-12-29 ENCOUNTER — Encounter: Payer: Self-pay | Admitting: Physician Assistant

## 2019-12-29 VITALS — Ht 69.0 in | Wt 269.0 lb

## 2019-12-29 DIAGNOSIS — M86272 Subacute osteomyelitis, left ankle and foot: Secondary | ICD-10-CM | POA: Diagnosis not present

## 2019-12-29 MED ORDER — DOXYCYCLINE HYCLATE 100 MG PO TABS: 100.0000 mg | ORAL_TABLET | Freq: Two times a day (BID) | ORAL | 0 refills | Status: DC

## 2019-12-29 NOTE — Progress Notes (Signed)
Office Visit Note   Patient: Katrina Wolfe           Date of Birth: 17-May-1966           MRN: [id number] Visit Date: 12/29/2019              Requested by: Wenda Low, MD [address] [place] [address] [place],  [place] [address] PCP: Wenda Low, MD  Chief Complaint  Patient presents with  . Left Foot - Follow-up    04/04/2019 Left Foot f/u      HPI: This is a pleasant woman who is now 8 months status post left fifth metatarsal excision of bone and removal of hardware.  She has a cavus foot deformity and has been wearing a boot.  She still has a very small ulcer.  This has been serially debrided.  She was concerned because she did notice a little bit of a foul odor.  Assessment & Plan: Visit Diagnoses: No diagnosis found.  Plan: I did debride the area of the callus.  I will put her on a short course of doxycycline overall I think her foot looks good.  She will follow-up in 1 week with Dr. Sharol Given to discuss possible bracing and reevaluate as he has not seen her in several months  Follow-Up Instructions: No follow-ups on file.   Ortho Exam  Patient is alert, oriented, no adenopathy, well-dressed, normal affect, normal respiratory effort. Left foot no fluctuance or cellulitis actually cannot appreciate any foul odor did not express any drainage once again I debrided the callus around the ulcerated bleeding tissue redress the wound.No cellulitis  Imaging: No results found. No images are attached to the encounter.  Labs: Lab Results  Component Value Date   REPTSTATUS 04/08/2019 FINAL 04/04/2019   GRAMSTAIN  04/04/2019    FEW WBC PRESENT,BOTH PMN AND MONONUCLEAR NO ORGANISMS SEEN    CULT  04/04/2019    FEW PSEUDOMONAS AERUGINOSA FEW SERRATIA MARCESCENS FEW STAPHYLOCOCCUS AUREUS CRITICAL RESULT CALLED TO, READ BACK BY AND VERIFIED WITH: RN Suzan Garibaldi [phone] FCP NO ANAEROBES ISOLATED Performed at [facility], [place] [address], [place], [place] [address]    LABORGA PSEUDOMONAS AERUGINOSA 04/04/2019   LABORGA SERRATIA MARCESCENS 04/04/2019   LABORGA STAPHYLOCOCCUS AUREUS 04/04/2019     Lab Results  Component Value Date   ALBUMIN 3.9 03/01/2019   ALBUMIN 3.5 (L) 05/24/2017   ALBUMIN 4.2 11/23/2016    No results found for: MG No results found for: VD25OH  No results found for: PREALBUMIN CBC EXTENDED Latest Ref Rng & Units 11/17/2019 10/01/2019 04/04/2019  WBC 3.8 - 10.8 Thousand/uL 9.6 - 9.7  RBC 3.80 - 5.10 Million/uL 4.19 - 3.54(L)  HGB 11.7 - 15.5 g/dL 12.0 10.8(L) 10.6(L)  HCT 35.0 - 45.0 % 36.6 - 34.0(L)  PLT 140 - 400 Thousand/uL 446(H) - 412(H)  NEUTROABS 1,500 - 7,800 cells/uL 5,789 - -  LYMPHSABS 850 - 3,900 cells/uL 2,736 - -     Body mass index is 39.72 kg/m.  Orders:  No orders of the defined types were placed in this encounter.  Meds ordered this encounter  Medications  . doxycycline (VIBRA-TABS) 100 MG tablet    Sig: Take 1 tablet (100 mg total) by mouth 2 (two) times daily.    Dispense:  30 tablet    Refill:  0     Procedures: No procedures performed  Clinical Data: No additional findings.  Maintain patient's comfort her wound for comfort care.  ROS:  All other systems negative, except as noted in the HPI. Review of Systems  Objective: Vital Signs: Ht 5\' 9"  (1.753 m)   Wt 269 lb (122 kg)   BMI 39.72 kg/m   Specialty Comments:  No specialty comments available.  PMFS History: Patient Active Problem List   Diagnosis Date Noted  . Osteomyelitis of foot ([place]) 04/04/2019  . Hardware complicating wound infection ([place])   . Subacute osteomyelitis, left ankle and foot ([place])   . Open displaced fracture of fifth metatarsal bone of left foot   . Healthcare maintenance 04/03/2019  . HIV disease ([place]) 10/27/2015  . DM type 2 (diabetes mellitus, type 2) ([place]) 10/27/2015  . Hyperlipidemia 10/27/2015  . Essential hypertension 10/27/2015  . Severe vulvar dysplasia, histologically confirmed 10/15/2015    Past Medical History:  Diagnosis Date  . Abnormal uterine bleeding (AUB)   . Arthritis    knees, fingers  . Asthma    as child, no problems in 20 yrs  . Cancer ([place])   . CKD (chronic kidney disease), stage II   . Congenital cardiomegaly    per pt has always been told since childhood and siblings   . GERD (gastroesophageal reflux disease)   . History of genital warts   . HIV (human immunodeficiency virus infection) ([place])    [place]-- DR Carlyle Basques  . Hypertension   . Intermittent palpitations    mild-- no meds  . Legally blind in left eye, as defined in [place]    trauma as child  . Tuberculosis    ? 2005 or 2007  . Type 2 diabetes mellitus (HCC)    Type II   . Uterine fibroid   . VIN III (vulvar intraepithelial neoplasia III)    and Verrucoid lesion on the mons  . Wears glasses     Family History  Problem Relation Age of Onset  . Hypertension Mother   . Diabetes Father   . Cancer Brother   . Breast cancer Cousin     Past Surgical History:  Procedure Laterality Date  . CESAREAN SECTION  2001  &  1984  . CO2 LASER APPLICATION N/A 62/01/2978   Procedure: CO2 LASER APPLICATION AND WIDE VOCAL EXCSION OF LESION ON MONS PUBIS;  Surgeon: Marti Sleigh, MD;  Location: [facility];  Service: Gynecology;  Laterality: N/A;   CASE CANCELLED   . CO2 LASER APPLICATION N/A 8/92/1194   Procedure: CO2 LASER OF VULVAR;  Surgeon: Marti Sleigh, MD;  Location[facility];  Service: Gynecology;  Laterality: N/A;  . CORNEAL TRANSPLANT Left 2006   failed  . D & C HYSTEROSCOPY /  RESECTION FIBROID  2004  . DILATATION & CURETTAGE/HYSTEROSCOPY WITH MYOSURE N/A 10/01/2019   Procedure: DILATATION & CURETTAGE/HYSTEROSCOPY WITH MYOSURE and HYDROTHERMAL ABLATION;  Surgeon: Thurnell Lose, MD;  Location: [facility];  Service: Gynecology;  Laterality: N/A;  HTA rep will be here. Confirmed on 09/25/19 CS   . HARDWARE REMOVAL Left 04/04/2019   Procedure: EXCISION BONE AND REMOVE DEEP HARDWARE LEFT 5TH METATARSAL;  Surgeon: Newt Minion, MD;  Location: [facility];  Service: Orthopedics;  Laterality: Left;  . KNEE ARTHROSCOPY W/ ACL RECONSTRUCTION Bilateral 2008  . ORIF TOE FRACTURE Left 08/02/2017   Procedure: OPEN REDUCTION INTERNAL FIXATION (ORIF) FIFTH METATARSAL (TOE) BASE FRACTURE;  Surgeon: Wylene Simmer, MD;  Location: [facility];  Service: Orthopedics;  Laterality: Left;  Marland Kitchen VULVECTOMY N/A 01/07/2016  Procedure: WIDE LOCAL EXCISION;  Surgeon: Marti Sleigh, MD;  Location: [facility];  Service: Gynecology;  Laterality: N/A;  mons pubis as site   Social History   Occupational History  . Not on file  Tobacco Use  . Smoking status: Never Smoker  . Smokeless tobacco: Never Used  Substance and Sexual Activity  . Alcohol use: No    Alcohol/week: 0.0 standard drinks  . Drug use: No  . Sexual activity: Yes    Partners: Male    Birth control/protection: None, Condom    Comment: declined condoms - has at home

## 2020-01-05 ENCOUNTER — Ambulatory Visit: Payer: Medicare Other | Admitting: Orthopedic Surgery

## 2020-02-09 ENCOUNTER — Encounter: Payer: Self-pay | Admitting: Orthopedic Surgery

## 2020-02-09 ENCOUNTER — Other Ambulatory Visit: Payer: Self-pay

## 2020-02-09 ENCOUNTER — Ambulatory Visit (INDEPENDENT_AMBULATORY_CARE_PROVIDER_SITE_OTHER): Payer: Medicare Other | Admitting: Orthopedic Surgery

## 2020-02-09 VITALS — Ht 69.0 in | Wt 269.0 lb

## 2020-02-09 DIAGNOSIS — E11610 Type 2 diabetes mellitus with diabetic neuropathic arthropathy: Secondary | ICD-10-CM

## 2020-02-09 DIAGNOSIS — M86272 Subacute osteomyelitis, left ankle and foot: Secondary | ICD-10-CM | POA: Diagnosis not present

## 2020-02-09 DIAGNOSIS — L97522 Non-pressure chronic ulcer of other part of left foot with fat layer exposed: Secondary | ICD-10-CM

## 2020-02-09 NOTE — Progress Notes (Signed)
Office Visit Note   Patient: Katrina Wolfe           Date of Birth: August 10, 1966           MRN: [id number] Visit Date: 02/09/2020              Requested by: Wenda Low, MD [address] [place] [address] [place],  [place] [address] PCP: Wenda Low, MD  Chief Complaint  Patient presents with  . Left Foot - Follow-up    04/04/19 left foot 5th MT excision of bone and removal of HDW      HPI: Patient is a 54 year old woman with diabetic insensate neuropathy with Charcot collapse and cavovarus collapse of the left foot with an ulcer beneath the fifth metatarsal head region.  She is status post excision of bone and removal of hardware.  Patient states that this week she has been having some increased pain and throbbing.  She denies any drainage.  Assessment & Plan: Visit Diagnoses:  1. Subacute osteomyelitis, left ankle and foot (HCC)   2. Charcot's arthropathy associated with type 2 diabetes mellitus ([place])   3. Ulcer of left foot, with fat layer exposed ([place])     Plan: Patient is given a prescription for a Crow walker from [employer].  She will continue with Dial soap cleansing and using a Band-Aid with pressure unloading on the lateral aspect of her foot.  Follow-Up Instructions: Return in about 4 weeks (around 03/08/2020).   Ortho Exam  Patient is alert, oriented, no adenopathy, well-dressed, normal affect, normal respiratory effort. Examination the ulcer has good granulation tissue this is 2 cm in diameter 1 mm deep with healthy viable granulation tissue no cellulitis no drainage no odor.  Patient has varus collapse of the hindfoot and cavus through the midfoot with Charcot arthropathy no active process.  Imaging: No results found. No images are attached to the encounter.  Labs: Lab Results  Component Value Date   REPTSTATUS 04/08/2019 FINAL 04/04/2019   GRAMSTAIN  04/04/2019    FEW WBC PRESENT,BOTH PMN AND MONONUCLEAR NO ORGANISMS SEEN    CULT  04/04/2019    FEW  PSEUDOMONAS AERUGINOSA FEW SERRATIA MARCESCENS FEW STAPHYLOCOCCUS AUREUS CRITICAL RESULT CALLED TO, READ BACK BY AND VERIFIED WITH: RN Suzan Garibaldi [phone] FCP NO ANAEROBES ISOLATED Performed at [facility], [place] [address] [place], [place] [address]    LABORGA PSEUDOMONAS AERUGINOSA 04/04/2019   LABORGA SERRATIA MARCESCENS 04/04/2019   LABORGA STAPHYLOCOCCUS AUREUS 04/04/2019     Lab Results  Component Value Date   ALBUMIN 3.9 03/01/2019   ALBUMIN 3.5 (L) 05/24/2017   ALBUMIN 4.2 11/23/2016    No results found for: MG No results found for: VD25OH  No results found for: PREALBUMIN CBC EXTENDED Latest Ref Rng & Units 11/17/2019 10/01/2019 04/04/2019  WBC 3.8 - 10.8 Thousand/uL 9.6 - 9.7  RBC 3.80 - 5.10 Million/uL 4.19 - 3.54(L)  HGB 11.7 - 15.5 g/dL 12.0 10.8(L) 10.6(L)  HCT 35.0 - 45.0 % 36.6 - 34.0(L)  PLT 140 - 400 Thousand/uL 446(H) - 412(H)  NEUTROABS 1,500 - 7,800 cells/uL 5,789 - -  LYMPHSABS 850 - 3,900 cells/uL 2,736 - -     Body mass index is 39.72 kg/m.  Orders:  No orders of the defined types were placed in this encounter.  No orders of the defined types were placed in this encounter.    Procedures: No procedures performed  Clinical Data: No additional findings.  ROS:  All other systems  negative, except as noted in the HPI. Review of Systems  Objective: Vital Signs: Ht 5\' 9"  (1.753 m)   Wt 269 lb (122 kg)   BMI 39.72 kg/m   Specialty Comments:  No specialty comments available.  PMFS History: Patient Active Problem List   Diagnosis Date Noted  . Osteomyelitis of foot ([place]) 04/04/2019  . Hardware complicating wound infection ([place])   . Subacute osteomyelitis, left ankle and foot ([place])   . Open displaced fracture of fifth metatarsal bone of left foot   . Healthcare maintenance 04/03/2019  . HIV disease ([place]) 10/27/2015  . DM type 2 (diabetes mellitus, type 2) ([place]) 10/27/2015  . Hyperlipidemia 10/27/2015  . Essential  hypertension 10/27/2015  . Severe vulvar dysplasia, histologically confirmed 10/15/2015   Past Medical History:  Diagnosis Date  . Abnormal uterine bleeding (AUB)   . Arthritis    knees, fingers  . Asthma    as child, no problems in 20 yrs  . Cancer ([place])   . CKD (chronic kidney disease), stage II   . Congenital cardiomegaly    per pt has always been told since childhood and siblings   . GERD (gastroesophageal reflux disease)   . History of genital warts   . HIV (human immunodeficiency virus infection) ([place])    [place]-- DR Carlyle Basques  . Hypertension   . Intermittent palpitations    mild-- no meds  . Legally blind in left eye, as defined in [place]    trauma as child  . Tuberculosis    ? 2005 or 2007  . Type 2 diabetes mellitus (HCC)    Type II   . Uterine fibroid   . VIN III (vulvar intraepithelial neoplasia III)    and Verrucoid lesion on the mons  . Wears glasses     Family History  Problem Relation Age of Onset  . Hypertension Mother   . Diabetes Father   . Cancer Brother   . Breast cancer Cousin     Past Surgical History:  Procedure Laterality Date  . CESAREAN SECTION  2001  &  1984  . CO2 LASER APPLICATION N/A 42/05/8340   Procedure: CO2 LASER APPLICATION AND WIDE VOCAL EXCSION OF LESION ON MONS PUBIS;  Surgeon: Marti Sleigh, MD;  Location: [facility];  Service: Gynecology;  Laterality: N/A;   CASE CANCELLED   . CO2 LASER APPLICATION N/A 9/62/2297   Procedure: CO2 LASER OF VULVAR;  Surgeon: Marti Sleigh, MD;  [facility];  Service: Gynecology;  Laterality: N/A;  . CORNEAL TRANSPLANT Left 2006   failed  . D & C HYSTEROSCOPY /  RESECTION FIBROID  2004  . DILATATION & CURETTAGE/HYSTEROSCOPY WITH MYOSURE N/A 10/01/2019   Procedure: DILATATION & CURETTAGE/HYSTEROSCOPY WITH MYOSURE and HYDROTHERMAL ABLATION;  Surgeon: Thurnell Lose, MD;  Location: [facility];   Service: Gynecology;  Laterality: N/A;  HTA rep will be here. Confirmed on 09/25/19 CS  . HARDWARE REMOVAL Left 04/04/2019   Procedure: EXCISION BONE AND REMOVE DEEP HARDWARE LEFT 5TH METATARSAL;  Surgeon: Newt Minion, MD;  Location: [facility];  Service: Orthopedics;  Laterality: Left;  . KNEE ARTHROSCOPY W/ ACL RECONSTRUCTION Bilateral 2008  . ORIF TOE FRACTURE Left 08/02/2017   Procedure: OPEN REDUCTION INTERNAL FIXATION (ORIF) FIFTH METATARSAL (TOE) BASE FRACTURE;  Surgeon: Wylene Simmer, MD;  Location: [facility];  Service: Orthopedics;  Laterality: Left;  Marland Kitchen VULVECTOMY N/A 01/07/2016   Procedure: WIDE LOCAL  EXCISION;  Surgeon: Marti Sleigh, MD;  Location: [facility];  Service: Gynecology;  Laterality: N/A;  mons pubis as site   Social History   Occupational History  . Not on file  Tobacco Use  . Smoking status: Never Smoker  . Smokeless tobacco: Never Used  Substance and Sexual Activity  . Alcohol use: No    Alcohol/week: 0.0 standard drinks  . Drug use: No  . Sexual activity: Yes    Partners: Male    Birth control/protection: None, Condom    Comment: declined condoms - has at home

## 2020-02-27 ENCOUNTER — Encounter: Payer: Self-pay | Admitting: Internal Medicine

## 2020-03-01 ENCOUNTER — Other Ambulatory Visit: Payer: Self-pay | Admitting: *Deleted

## 2020-03-01 ENCOUNTER — Other Ambulatory Visit: Payer: Self-pay

## 2020-03-01 ENCOUNTER — Other Ambulatory Visit: Payer: Medicaid Other

## 2020-03-01 DIAGNOSIS — B20 Human immunodeficiency virus [HIV] disease: Secondary | ICD-10-CM

## 2020-03-01 DIAGNOSIS — Z79899 Other long term (current) drug therapy: Secondary | ICD-10-CM

## 2020-03-01 DIAGNOSIS — Z113 Encounter for screening for infections with a predominantly sexual mode of transmission: Secondary | ICD-10-CM

## 2020-03-02 LAB — T-HELPER CELL (CD4) - (RCID CLINIC ONLY)
CD4 % Helper T Cell: 25 % — ABNORMAL LOW (ref 33–65)
CD4 T Cell Abs: 563 /uL (ref 400–1790)

## 2020-03-04 LAB — COMPLETE METABOLIC PANEL WITH GFR
AG Ratio: 1.1 (calc) (ref 1.0–2.5)
ALT: 13 U/L (ref 6–29)
AST: 13 U/L (ref 10–35)
Albumin: 3.7 g/dL (ref 3.6–5.1)
Alkaline phosphatase (APISO): 76 U/L (ref 37–153)
BUN: 14 mg/dL (ref 7–25)
CO2: 25 mmol/L (ref 20–32)
Calcium: 9.2 mg/dL (ref 8.6–10.4)
Chloride: 105 mmol/L (ref 98–110)
Creat: 1.01 mg/dL (ref 0.50–1.05)
GFR, Est African American: 74 mL/min/{1.73_m2} (ref >=60)
GFR, Est Non African American: 64 mL/min/{1.73_m2} (ref >=60)
Globulin: 3.5 g/dL (calc) (ref 1.9–3.7)
Glucose, Bld: 127 mg/dL — ABNORMAL HIGH (ref 65–99)
Potassium: 4.3 mmol/L (ref 3.5–5.3)
Sodium: 138 mmol/L (ref 135–146)
Total Bilirubin: 0.3 mg/dL (ref 0.2–1.2)
Total Protein: 7.2 g/dL (ref 6.1–8.1)

## 2020-03-04 LAB — CBC WITH DIFFERENTIAL/PLATELET
Absolute Monocytes: 792 cells/uL (ref 200–950)
Basophils Absolute: 43 cells/uL (ref 0–200)
Basophils Relative: 0.4 %
Eosinophils Absolute: 203 cells/uL (ref 15–500)
Eosinophils Relative: 1.9 %
HCT: 38.3 % (ref 35.0–45.0)
Hemoglobin: 12.6 g/dL (ref 11.7–15.5)
Lymphs Abs: 2482 cells/uL (ref 850–3900)
MCH: 28 pg (ref 27.0–33.0)
MCHC: 32.9 g/dL (ref 32.0–36.0)
MCV: 85.1 fL (ref 80.0–100.0)
MPV: 11.4 fL (ref 7.5–12.5)
Monocytes Relative: 7.4 %
Neutro Abs: 7180 cells/uL (ref 1500–7800)
Neutrophils Relative %: 67.1 %
Platelets: 351 10*3/uL (ref 140–400)
RBC: 4.5 10*6/uL (ref 3.80–5.10)
RDW: 15.3 % — ABNORMAL HIGH (ref 11.0–15.0)
Total Lymphocyte: 23.2 %
WBC: 10.7 10*3/uL (ref 3.8–10.8)

## 2020-03-04 LAB — LIPID PANEL
Cholesterol: 174 mg/dL (ref <200)
HDL: 66 mg/dL (ref >=50)
LDL Cholesterol (Calc): 89 mg/dL (calc)
Non-HDL Cholesterol (Calc): 108 mg/dL (calc) (ref <130)
Total CHOL/HDL Ratio: 2.6 (calc) (ref <5.0)
Triglycerides: 92 mg/dL (ref <150)

## 2020-03-04 LAB — HIV-1 RNA QUANT-NO REFLEX-BLD
HIV 1 RNA Quant: 35 copies/mL — ABNORMAL HIGH
HIV-1 RNA Quant, Log: 1.54 Log copies/mL — ABNORMAL HIGH

## 2020-03-04 LAB — RPR: RPR Ser Ql: NONREACTIVE

## 2020-03-08 ENCOUNTER — Other Ambulatory Visit: Payer: Self-pay

## 2020-03-08 ENCOUNTER — Encounter: Payer: Self-pay | Admitting: Orthopedic Surgery

## 2020-03-08 ENCOUNTER — Ambulatory Visit (INDEPENDENT_AMBULATORY_CARE_PROVIDER_SITE_OTHER): Payer: 59 | Admitting: Orthopedic Surgery

## 2020-03-08 VITALS — Ht 69.0 in | Wt 269.0 lb

## 2020-03-08 DIAGNOSIS — E11610 Type 2 diabetes mellitus with diabetic neuropathic arthropathy: Secondary | ICD-10-CM

## 2020-03-08 DIAGNOSIS — L97521 Non-pressure chronic ulcer of other part of left foot limited to breakdown of skin: Secondary | ICD-10-CM

## 2020-03-08 NOTE — Progress Notes (Signed)
Office Visit Note   Patient: Katrina Wolfe           Date of Birth: 1966-09-10           MRN: [id number] Visit Date: 03/08/2020              Requested by: Wenda Low, MD [address] [place],  [place] [address] PCP: Wenda Low, MD  Chief Complaint  Patient presents with  . 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  Assessment & Plan: Visit Diagnoses:  1. Charcot's arthropathy associated with type 2 diabetes mellitus ([place])   2. Non-pressure chronic ulcer of other part of left foot limited to breakdown of skin ([place])     Plan: Ulcer was debrided of skin and soft tissue dry dressing was applied a felt relieving donut was provided that she could wear within her PRAFO.  Patient is awaiting authorization for her Milinda Cave walker.  Follow-Up Instructions: Return in about 2 weeks (around 03/22/2020).   Ortho Exam  Patient is alert, oriented, no adenopathy, well-dressed, normal affect, normal respiratory effort. Examination patient has a palpable pulse she is unstable cavovarus collapse of the hindfoot.  She has a large insensate neuropathic ulcer over the base of the fifth metatarsal.  After informed consent a 10 blade knife was used to debride the skin and soft tissue back to healthy viable bleeding granulation tissue this was touched with silver nitrate the ulcer does not probe to bone or tendon.  Ulcer is 4 cm in diameter 3 mm deep.   Patient states she does check her temperature at home and she has had no fevers.  Imaging: No results found. No images are attached to the encounter.  Labs: Lab Results  Component Value Date   REPTSTATUS 04/08/2019 FINAL 04/04/2019   GRAMSTAIN  04/04/2019    FEW WBC  PRESENT,BOTH PMN AND MONONUCLEAR NO ORGANISMS SEEN    CULT  04/04/2019    FEW PSEUDOMONAS AERUGINOSA FEW SERRATIA MARCESCENS FEW STAPHYLOCOCCUS AUREUS CRITICAL RESULT CALLED TO, READ BACK BY AND VERIFIED WITH: RN Suzan Garibaldi [id number] FCP NO ANAEROBES ISOLATED Performed at [facility], [place] [address], [place], [place] [address]    LABORGA PSEUDOMONAS AERUGINOSA 04/04/2019   LABORGA SERRATIA MARCESCENS 04/04/2019   LABORGA STAPHYLOCOCCUS AUREUS 04/04/2019     Lab Results  Component Value Date   ALBUMIN 3.9 03/01/2019   ALBUMIN 3.5 (L) 05/24/2017   ALBUMIN 4.2 11/23/2016    No results found for: MG No results found for: VD25OH  No results found for: PREALBUMIN CBC EXTENDED Latest Ref Rng & Units 03/01/2020 11/17/2019 10/01/2019  WBC 3.8 - 10.8 Thousand/uL 10.7 9.6 -  RBC 3.80 - 5.10 Million/uL 4.50 4.19 -  HGB 11.7 - 15.5 g/dL 12.6 12.0 10.8(L)  HCT 35.0 - 45.0 % 38.3 36.6 -  PLT 140 - 400 Thousand/uL 351 446(H) -  NEUTROABS 1,500 - 7,800 cells/uL 7,180 5,789 -  LYMPHSABS 850 - 3,900 cells/uL 2,482 2,736 -     Body mass index is 39.72 kg/m.  Orders:  No orders of the defined types were placed in this encounter.  No orders of the defined types were placed in this encounter.  Procedures: No procedures performed  Clinical Data: No additional findings.  ROS:  All other systems negative, except as noted in the HPI. Review of Systems  Objective: Vital Signs: Ht 5\' 9"  (1.753 m)   Wt 269 lb (122 kg)   BMI 39.72 kg/m   Specialty Comments:  No specialty comments available.  PMFS History: Patient Active Problem List   Diagnosis Date Noted  . Osteomyelitis of foot ([place]) 04/04/2019  . Hardware complicating wound infection ([place])   . Subacute osteomyelitis, left ankle and foot ([place])   . Open displaced fracture of fifth metatarsal bone of left foot   . Healthcare maintenance 04/03/2019  . HIV disease ([place]) 10/27/2015  . DM type 2 (diabetes mellitus,  type 2) ([place]) 10/27/2015  . Hyperlipidemia 10/27/2015  . Essential hypertension 10/27/2015  . Severe vulvar dysplasia, histologically confirmed 10/15/2015   Past Medical History:  Diagnosis Date  . Abnormal uterine bleeding (AUB)   . Arthritis    knees, fingers  . Asthma    as child, no problems in 20 yrs  . Cancer ([place])   . CKD (chronic kidney disease), stage II   . Congenital cardiomegaly    per pt has always been told since childhood and siblings   . GERD (gastroesophageal reflux disease)   . History of genital warts   . HIV (human immunodeficiency virus infection) ([place])    [place]-- DR Carlyle Basques  . Hypertension   . Intermittent palpitations    mild-- no meds  . Legally blind in left eye, as defined in [place]    trauma as child  . Tuberculosis    ? 2005 or 2007  . Type 2 diabetes mellitus (HCC)    Type II   . Uterine fibroid   . VIN III (vulvar intraepithelial neoplasia III)    and Verrucoid lesion on the mons  . Wears glasses     Family History  Problem Relation Age of Onset  . Hypertension Mother   . Diabetes Father   . Cancer Brother   . Breast cancer Cousin     Past Surgical History:  Procedure Laterality Date  . CESAREAN SECTION  2001  &  1984  . CO2 LASER APPLICATION N/A 33/02/5455   Procedure: CO2 LASER APPLICATION AND WIDE VOCAL EXCSION OF LESION ON MONS PUBIS;  Surgeon: Marti Sleigh, MD;  Location: [facility];  Service: Gynecology;  Laterality: N/A;   CASE CANCELLED   . CO2 LASER APPLICATION N/A 2/56/3893   Procedure: CO2 LASER OF VULVAR;  Surgeon: Marti Sleigh, MD;  Location[facility];  Service: Gynecology;  Laterality: N/A;  . CORNEAL TRANSPLANT Left 2006   failed  . D & C HYSTEROSCOPY /  RESECTION FIBROID  2004  . DILATATION & CURETTAGE/HYSTEROSCOPY WITH MYOSURE N/A 10/01/2019   Procedure: DILATATION & CURETTAGE/HYSTEROSCOPY WITH MYOSURE and HYDROTHERMAL ABLATION;   Surgeon: Thurnell Lose, MD;  Location: [facility];  Service: Gynecology;  Laterality: N/A;  HTA rep will be here. Confirmed on 09/25/19 CS  . HARDWARE REMOVAL Left 04/04/2019   Procedure: EXCISION BONE AND REMOVE DEEP HARDWARE LEFT 5TH METATARSAL;  Surgeon: Newt Minion, MD;  Location: [facility];  Service: Orthopedics;  Laterality: Left;  . KNEE ARTHROSCOPY W/ ACL RECONSTRUCTION Bilateral 2008  . ORIF TOE FRACTURE Left 08/02/2017   Procedure: OPEN REDUCTION INTERNAL FIXATION (ORIF) FIFTH METATARSAL (TOE) BASE FRACTURE;  Surgeon: Wylene Simmer, MD;  Location: [facility];  Service: Orthopedics;  Laterality: Left;  Marland Kitchen VULVECTOMY N/A 01/07/2016   Procedure: WIDE LOCAL EXCISION;  Surgeon: Marti Sleigh, MD;  Location: [facility];  Service: Gynecology;  Laterality: N/A;  mons pubis as site   Social History   Occupational History  . Not on file  Tobacco Use  . Smoking status: Never Smoker  . Smokeless tobacco: Never Used  Substance and Sexual Activity  . Alcohol use: No    Alcohol/week: 0.0 standard drinks  . Drug use: No  . Sexual activity: Yes    Partners: Male    Birth control/protection: None, Condom    Comment: declined condoms - has at home

## 2020-03-15 ENCOUNTER — Encounter: Payer: Medicare Other | Admitting: Internal Medicine

## 2020-03-22 ENCOUNTER — Other Ambulatory Visit: Payer: Self-pay

## 2020-03-22 ENCOUNTER — Encounter: Payer: Medicare Other | Admitting: Internal Medicine

## 2020-03-22 ENCOUNTER — Ambulatory Visit (INDEPENDENT_AMBULATORY_CARE_PROVIDER_SITE_OTHER): Payer: 59 | Admitting: Orthopedic Surgery

## 2020-03-22 ENCOUNTER — Encounter: Payer: Self-pay | Admitting: Orthopedic Surgery

## 2020-03-22 VITALS — Ht 69.5 in | Wt 268.0 lb

## 2020-03-22 DIAGNOSIS — E11610 Type 2 diabetes mellitus with diabetic neuropathic arthropathy: Secondary | ICD-10-CM | POA: Diagnosis not present

## 2020-03-22 DIAGNOSIS — L97521 Non-pressure chronic ulcer of other part of left foot limited to breakdown of skin: Secondary | ICD-10-CM

## 2020-03-22 NOTE — Progress Notes (Signed)
Office Visit Note   Patient: Katrina Wolfe           Date of Birth: 03-17-1966           MRN: [id number] Visit Date: 03/22/2020              Requested by: Wenda Low, MD [address] [place],  [place] [address] PCP: Wenda Low, MD  Chief Complaint  Patient presents with  . Left Foot - Follow-up    04/04/2019 Left Foot 5th MT Excision of Bone and Removal of Hardware      HPI: Patient is a 54 year old woman who presents in follow-up with Charcot collapse cavovarus deformity to the foot with persistent Wagner grade 1 ulcer over the base of the fifth metatarsal.  Patient is currently using a PRAFO and antibiotic ointment with a felt relieving donut.  Assessment & Plan: Visit Diagnoses:  1. Non-pressure chronic ulcer of other part of left foot limited to breakdown of skin ([place])   2. Charcot's arthropathy associated with type 2 diabetes mellitus ([place])     Plan: Continue with current treatment we will give her a new felt relieving donut continue with the [facility] continue the antibiotic ointment will apply as it is warm today.  Follow-Up Instructions: Return in about 3 weeks (around 04/12/2020).   Ortho Exam  Patient is alert, oriented, no adenopathy, well-dressed, normal affect, normal respiratory effort. Patient has a cavovarus deformity with prominent base of the fifth metatarsal.  There is callus around the wound this was removed and patient has 100% beefy granulation tissue in the wound bed approximately 10 mm in diameter liters deep.  There is no redness no cellulitis no odor no exposed bone or tendon no signs of infection.  Imaging: No results found. No images are attached to the encounter.  Labs: Lab Results  Component Value Date   REPTSTATUS 04/08/2019 FINAL 04/04/2019   GRAMSTAIN  04/04/2019    FEW WBC PRESENT,BOTH PMN AND MONONUCLEAR NO ORGANISMS SEEN    CULT  04/04/2019    FEW PSEUDOMONAS AERUGINOSA FEW SERRATIA MARCESCENS FEW STAPHYLOCOCCUS  AUREUS CRITICAL RESULT CALLED TO, READ BACK BY AND VERIFIED WITH: RN Suzan Garibaldi [id number] FCP NO ANAEROBES ISOLATED Performed at [facility], [place] [address], [place], [place] [address]    LABORGA PSEUDOMONAS AERUGINOSA 04/04/2019   LABORGA SERRATIA MARCESCENS 04/04/2019   LABORGA STAPHYLOCOCCUS AUREUS 04/04/2019     Lab Results  Component Value Date   ALBUMIN 3.9 03/01/2019   ALBUMIN 3.5 (L) 05/24/2017   ALBUMIN 4.2 11/23/2016    No results found for: MG No results found for: VD25OH  No results found for: PREALBUMIN CBC EXTENDED Latest Ref Rng & Units 03/01/2020 11/17/2019 10/01/2019  WBC 3.8 - 10.8 Thousand/uL 10.7 9.6 -  RBC 3.80 - 5.10 Million/uL 4.50 4.19 -  HGB 11.7 - 15.5 g/dL 12.6 12.0 10.8(L)  HCT 35.0 - 45.0 % 38.3 36.6 -  PLT 140 - 400 Thousand/uL 351 446(H) -  NEUTROABS 1,500 - 7,800 cells/uL 7,180 5,789 -  LYMPHSABS 850 - 3,900 cells/uL 2,482 2,736 -     Body mass index is 39.01 kg/m.  Orders:  No orders of the defined types were placed in this encounter.  No orders of the defined types were placed in this encounter.    Procedures: No procedures performed  Clinical Data: No additional findings.  ROS:  All other systems negative, except as noted in the HPI. Review of Systems  Objective:  Vital Signs: Ht 5' 9.5" (1.765 m)   Wt 268 lb (121.6 kg)   BMI 39.01 kg/m   Specialty Comments:  No specialty comments available.  PMFS History: Patient Active Problem List   Diagnosis Date Noted  . Osteomyelitis of foot ([place]) 04/04/2019  . Hardware complicating wound infection ([place])   . Subacute osteomyelitis, left ankle and foot ([place])   . Open displaced fracture of fifth metatarsal bone of left foot   . Healthcare maintenance 04/03/2019  . HIV disease ([place]) 10/27/2015  . DM type 2 (diabetes mellitus, type 2) ([place]) 10/27/2015  . Hyperlipidemia 10/27/2015  . Essential hypertension 10/27/2015  . Severe vulvar dysplasia, histologically confirmed  10/15/2015   Past Medical History:  Diagnosis Date  . Abnormal uterine bleeding (AUB)   . Arthritis    knees, fingers  . Asthma    as child, no problems in 20 yrs  . Cancer ([place])   . CKD (chronic kidney disease), stage II   . Congenital cardiomegaly    per pt has always been told since childhood and siblings   . GERD (gastroesophageal reflux disease)   . History of genital warts   . HIV (human immunodeficiency virus infection) ([place])    [place]-- DR Carlyle Basques  . Hypertension   . Intermittent palpitations    mild-- no meds  . Legally blind in left eye, as defined in [place]    trauma as child  . Tuberculosis    ? 2005 or 2007  . Type 2 diabetes mellitus (HCC)    Type II   . Uterine fibroid   . VIN III (vulvar intraepithelial neoplasia III)    and Verrucoid lesion on the mons  . Wears glasses     Family History  Problem Relation Age of Onset  . Hypertension Mother   . Diabetes Father   . Cancer Brother   . Breast cancer Cousin     Past Surgical History:  Procedure Laterality Date  . CESAREAN SECTION  2001  &  1984  . CO2 LASER APPLICATION N/A 76/01/2632   Procedure: CO2 LASER APPLICATION AND WIDE VOCAL EXCSION OF LESION ON MONS PUBIS;  Surgeon: Marti Sleigh, MD;  Location: [facility];  Service: Gynecology;  Laterality: N/A;   CASE CANCELLED   . CO2 LASER APPLICATION N/A 3/54/5625   Procedure: CO2 LASER OF VULVAR;  Surgeon: Marti Sleigh, MD;  Location: [facility];  Service: Gynecology;  Laterality: N/A;  . CORNEAL TRANSPLANT Left 2006   failed  . D & C HYSTEROSCOPY /  RESECTION FIBROID  2004  . DILATATION & CURETTAGE/HYSTEROSCOPY WITH MYOSURE N/A 10/01/2019   Procedure: DILATATION & CURETTAGE/HYSTEROSCOPY WITH MYOSURE and HYDROTHERMAL ABLATION;  Surgeon: Thurnell Lose, MD;  Location: [facility];  Service: Gynecology;  Laterality: N/A;  HTA rep will be here. Confirmed on  09/25/19 CS  . HARDWARE REMOVAL Left 04/04/2019   Procedure: EXCISION BONE AND REMOVE DEEP HARDWARE LEFT 5TH METATARSAL;  Surgeon: Newt Minion, MD;  Location: [facility];  Service: Orthopedics;  Laterality: Left;  . KNEE ARTHROSCOPY W/ ACL RECONSTRUCTION Bilateral 2008  . ORIF TOE FRACTURE Left 08/02/2017   Procedure: OPEN REDUCTION INTERNAL FIXATION (ORIF) FIFTH METATARSAL (TOE) BASE FRACTURE;  Surgeon: Wylene Simmer, MD;  Location: [facility];  Service: Orthopedics;  Laterality: Left;  Marland Kitchen VULVECTOMY N/A 01/07/2016   Procedure: WIDE LOCAL EXCISION;  Surgeon: Marti Sleigh, MD;  Location[facility];  Service: Gynecology;  Laterality: N/A;  mons pubis as site   Social History   Occupational History  . Not on file  Tobacco Use  . Smoking status: Never Smoker  . Smokeless tobacco: Never Used  Substance and Sexual Activity  . Alcohol use: No    Alcohol/week: 0.0 standard drinks  . Drug use: No  . Sexual activity: Yes    Partners: Male    Birth control/protection: None, Condom    Comment: declined condoms - has at home

## 2020-03-26 ENCOUNTER — Telehealth: Payer: Self-pay

## 2020-03-26 NOTE — Telephone Encounter (Signed)
COVID-19 Pre-Screening Questions:03/26/20  Do you currently have a fever (>100 F), chills or unexplained body aches?NO   Are you currently experiencing new cough, shortness of breath, sore throat, runny nose?NO  .  Have you recently travelled outside the state of [place] in the last 14 days? NO  .  Have you been in contact with someone that is currently pending confirmation of Covid19 testing or has been confirmed to have the Covid19 virus? NO  **If the patient answers NO to ALL questions -  advise the patient to please call the clinic before coming to the office should any symptoms develop.     

## 2020-03-29 ENCOUNTER — Ambulatory Visit (INDEPENDENT_AMBULATORY_CARE_PROVIDER_SITE_OTHER): Payer: 59 | Admitting: Internal Medicine

## 2020-03-29 ENCOUNTER — Encounter: Payer: Self-pay | Admitting: Internal Medicine

## 2020-03-29 ENCOUNTER — Telehealth: Payer: Self-pay | Admitting: *Deleted

## 2020-03-29 ENCOUNTER — Other Ambulatory Visit: Payer: Self-pay

## 2020-03-29 VITALS — BP 133/84 | HR 76 | Temp 98.2°F

## 2020-03-29 DIAGNOSIS — F4321 Adjustment disorder with depressed mood: Secondary | ICD-10-CM | POA: Diagnosis not present

## 2020-03-29 DIAGNOSIS — E11621 Type 2 diabetes mellitus with foot ulcer: Secondary | ICD-10-CM

## 2020-03-29 DIAGNOSIS — Z79899 Other long term (current) drug therapy: Secondary | ICD-10-CM | POA: Diagnosis not present

## 2020-03-29 DIAGNOSIS — B20 Human immunodeficiency virus [HIV] disease: Secondary | ICD-10-CM | POA: Diagnosis not present

## 2020-03-29 DIAGNOSIS — L97509 Non-pressure chronic ulcer of other part of unspecified foot with unspecified severity: Secondary | ICD-10-CM

## 2020-03-29 NOTE — Patient Instructions (Signed)
COVID-19 Vaccine Information can be found at: [email or web address] For questions related to vaccine distribution or appointments, please email [email or web address] or call [phone].    

## 2020-03-29 NOTE — Telephone Encounter (Signed)
Patient brought 1/2 of a total and permanent disability discharge application for loans to her office appointment. Dr Baxter Flattery asked Livy to please bring in a copy of the application in it's entirety so she could see if she is able to complete this form. Patient aware that Dr Baxter Flattery may not be able to fill this form out for her, that she may need her PCP to do so or have a disability determination. Landis Gandy, RN

## 2020-03-29 NOTE — Progress Notes (Signed)
RFV: follow up for hiv disease  Patient ID: Katrina Wolfe, female   DOB: 1965/12/28, 54 y.o.   MRN: [id number]  HPI  Joud is a 54yo F with well controlled hiv disease, CD 4 count 563/VL 35 on biktravy. IDDM was elevated in winter but now improved. HW removal from left ankle last April but still wears supportive boot for Left foot swelling but has small open wound.  Has a sister to confide in. Mother passed away in 2020/01/29, unexpectedly in her sleep. Sister taking care of affairs. She was stressed at the time.  Outpatient Encounter Medications as of 03/29/2020  Medication Sig  . acetaminophen (TYLENOL) 500 MG tablet Take 2 tablets (1,000 mg total) by mouth every 8 (eight) hours as needed for moderate pain.  Marland Kitchen aspirin EC 81 MG tablet Take 1 tablet (81 mg total) by mouth 2 (two) times daily.  Marland Kitchen aspirin-acetaminophen-caffeine (EXCEDRIN MIGRAINE) 250-250-65 MG tablet Take 2 tablets by mouth daily as needed for headache or migraine.  Marland Kitchen atorvastatin (LIPITOR) 10 MG tablet Take 10 mg by mouth every morning.   Marland Kitchen BIKTARVY 50-200-25 MG TABS tablet TAKE ONE TABLET BY MOUTH EVERY EVENING WITH DINNER  . calcium carbonate (TUMS - DOSED IN MG ELEMENTAL CALCIUM) 500 MG chewable tablet Chew 1 tablet by mouth as needed for indigestion or heartburn.  . cloNIDine (CATAPRES) 0.3 MG tablet Take 0.3 mg by mouth 2 (two) times daily.   Marland Kitchen gabapentin (NEURONTIN) 100 MG capsule Take 100 mg by mouth 3 (three) times daily.  Marland Kitchen glimepiride (AMARYL) 2 MG tablet Take 2 mg by mouth daily.  Marland Kitchen HUMALOG MIX 75/25 KWIKPEN (75-25) 100 UNIT/ML Kwikpen Inject 56 Units into the skin 2 (two) times daily.   . Insulin Syringe-Needle U-100 (INSULIN SYRINGE 1CC/31GX5/16") 31G X 5/16" 1 ML MISC 1 Units by Does not apply route 2 (two) times daily.  Marland Kitchen JARDIANCE 10 MG TABS tablet Take 10 mg by mouth every morning.  . Lancets (ONETOUCH ULTRASOFT) lancets   . lisinopril-hydrochlorothiazide (PRINZIDE,ZESTORETIC) 20-25 MG tablet Take 1  tablet by mouth daily.  . ONE TOUCH ULTRA TEST test strip   . VICTOZA 18 MG/3ML SOPN Inject 1.8 mg into the skin every morning.   Marland Kitchen atropine 1 % ophthalmic solution Place 1 drop into both eyes 2 (two) times daily.  . [DISCONTINUED] doxycycline (VIBRA-TABS) 100 MG tablet Take 1 tablet (100 mg total) by mouth 2 (two) times daily. (Patient not taking: Reported on 03/29/2020)  . [DISCONTINUED] lisinopril (PRINIVIL,ZESTRIL) 10 MG tablet Take 1 tablet (10 mg total) by mouth daily.  . [DISCONTINUED] metFORMIN (GLUCOPHAGE) 1000 MG tablet Take 1,000 mg by mouth daily with breakfast.    No facility-administered encounter medications on file as of 03/29/2020.     Patient Active Problem List   Diagnosis Date Noted  . Osteomyelitis of foot ([place]) 04/04/2019  . Hardware complicating wound infection ([place])   . Subacute osteomyelitis, left ankle and foot ([place])   . Open displaced fracture of fifth metatarsal bone of left foot   . Healthcare maintenance 04/03/2019  . HIV disease ([place]) 10/27/2015  . DM type 2 (diabetes mellitus, type 2) ([place]) 10/27/2015  . Hyperlipidemia 10/27/2015  . Essential hypertension 10/27/2015  . Severe vulvar dysplasia, histologically confirmed 10/15/2015     Health Maintenance Due  Topic Date Due  . HEMOGLOBIN A1C  Never done  . FOOT EXAM  Never done  . OPHTHALMOLOGY EXAM  Never done  . COLONOSCOPY  Never done  Review of Systems Review of Systems  Constitutional: Negative for fever, chills, diaphoresis, activity change, appetite change, fatigue and unexpected weight change.  HENT: Negative for congestion, sore throat, rhinorrhea, sneezing, trouble swallowing and sinus pressure.  Eyes: Negative for photophobia and visual disturbance.  Respiratory: Negative for cough, chest tightness, shortness of breath, wheezing and stridor.  Cardiovascular: Negative for chest pain, palpitations and leg swelling.  Gastrointestinal: Negative for nausea, vomiting, abdominal pain,  diarrhea, constipation, blood in stool, abdominal distention and anal bleeding.  Genitourinary: Negative for dysuria, hematuria, flank pain and difficulty urinating.  Musculoskeletal: Negative for myalgias, back pain, joint swelling, arthralgias and gait problem.  Skin: Negative for color change, pallor, rash and wound.  Neurological: Negative for dizziness, tremors, weakness and light-headedness.  Hematological: Negative for adenopathy. Does not bruise/bleed easily.  Psychiatric/Behavioral: Negative for behavioral problems, confusion, sleep disturbance, dysphoric mood, decreased concentration and agitation.    Physical Exam   BP 133/84   Pulse 76   Temp 98.2 F (36.8 C) (Oral)   SpO2 98%   Physical Exam  Constitutional:  oriented to person, place, and time. appears well-developed and well-nourished. No distress.  HENT: [place]/AT, PERRLA, no scleral icterus Mouth/Throat: Oropharynx is clear and moist. No oropharyngeal exudate.  Cardiovascular: Normal rate, regular rhythm and normal heart sounds. Exam reveals no gallop and no friction rub.  No murmur heard.  Pulmonary/Chest: Effort normal and breath sounds normal. No respiratory distress.  has no wheezes.  Neck = supple, no nuchal rigidity Abdominal: Soft. Bowel sounds are normal.  exhibits no distension. There is no tenderness.  Lymphadenopathy: no cervical adenopathy. No axillary adenopathy Neurological: alert and oriented to person, place, and time.  Skin: Skin is warm and dry. No rash noted. No erythema.  Psychiatric: a normal mood and affect.  behavior is normal.   Lab Results  Component Value Date   CD4TCELL 25 (L) 03/01/2020   Lab Results  Component Value Date   CD4TABS 563 03/01/2020   CD4TABS 759 11/17/2019   CD4TABS 605 08/04/2019   Lab Results  Component Value Date   HIV1RNAQUANT 35 (H) 03/01/2020   Lab Results  Component Value Date   HEPBSAB NEG 10/13/2015   Lab Results  Component Value Date   LABRPR  NON-REACTIVE 03/01/2020    CBC Lab Results  Component Value Date   WBC 10.7 03/01/2020   RBC 4.50 03/01/2020   HGB 12.6 03/01/2020   HCT 38.3 03/01/2020   PLT 351 03/01/2020   MCV 85.1 03/01/2020   MCH 28.0 03/01/2020   MCHC 32.9 03/01/2020   RDW 15.3 (H) 03/01/2020   LYMPHSABS 2,482 03/01/2020   MONOABS 1.0 03/01/2019   EOSABS 203 03/01/2020    BMET Lab Results  Component Value Date   NA 138 03/01/2020   K 4.3 03/01/2020   CL 105 03/01/2020   CO2 25 03/01/2020   GLUCOSE 127 (H) 03/01/2020   BUN 14 03/01/2020   CREATININE 1.01 03/01/2020   CALCIUM 9.2 03/01/2020   GFRNONAA 64 03/01/2020   GFRAA 74 03/01/2020      Assessment and Plan  hiv disease = well controlled, continue on biktarvy  Long term medication management = cr stable  Health maintenance = not yet ready for covid vaccine.   IDDM with DFU = continue your diabetes regimen, goal closer to 6. Continue with local wound care  Grief = will refer her to [facility] for grief counseling

## 2020-04-08 ENCOUNTER — Other Ambulatory Visit: Payer: Self-pay

## 2020-04-08 DIAGNOSIS — B20 Human immunodeficiency virus [HIV] disease: Secondary | ICD-10-CM

## 2020-04-08 MED ORDER — BIKTARVY 50-200-25 MG PO TABS: ORAL_TABLET | ORAL | 5 refills | Status: DC

## 2020-04-12 ENCOUNTER — Other Ambulatory Visit: Payer: Self-pay

## 2020-04-12 ENCOUNTER — Ambulatory Visit (INDEPENDENT_AMBULATORY_CARE_PROVIDER_SITE_OTHER): Payer: 59 | Admitting: Orthopedic Surgery

## 2020-04-12 ENCOUNTER — Encounter: Payer: Self-pay | Admitting: Orthopedic Surgery

## 2020-04-12 VITALS — Ht 69.0 in | Wt 268.0 lb

## 2020-04-12 DIAGNOSIS — L97521 Non-pressure chronic ulcer of other part of left foot limited to breakdown of skin: Secondary | ICD-10-CM | POA: Diagnosis not present

## 2020-04-12 NOTE — Progress Notes (Signed)
Office Visit Note   Patient: Katrina Wolfe           Date of Birth: Sep 15, 1966           MRN: [id number] Visit Date: 04/12/2020              Requested by: Wenda Low, MD [address] [place] [place],  [place] [address] PCP: Wenda Low, MD  Chief Complaint  Patient presents with  . Left Foot - Follow-up    04/04/2019 Left Foot 5th MT Excision of Bone and Removal of Hardware      HPI: Patient is a 54 year old woman who presents in follow-up for chronic ulcer base of the fifth metatarsal left foot.  Patient has been using antibiotic ointment a felt relieving donut and a PRAFO to unload pressure.  Patient denies any systemic symptoms.  Assessment & Plan: Visit Diagnoses:  1. Non-pressure chronic ulcer of other part of left foot limited to breakdown of skin ([place])     Plan: Ulcer was debrided of skin and soft tissue continue wound care continue pressure unloading.  Follow-Up Instructions: Return in about 4 weeks (around 05/10/2020).   Ortho Exam  Patient is alert, oriented, no adenopathy, well-dressed, normal affect, normal respiratory effort. Examination patient has progressive cavovarus deformity of the left foot.  There is hypertrophic callus and maceration around the wound.  Patient has a palpable pulse.  Ulcer is 1 cm in diameter.  After informed consent a 10 blade knife was used to debride the skin and soft tissue back to healthy viable bleeding granulation tissue this was touched with silver nitrate.  Post debridement measurements are 2 x 3 cm and a half a centimeter deep.  Iodosorb gauze and Ace wrap is applied.  Imaging: No results found. No images are attached to the encounter.  Labs: Lab Results  Component Value Date   REPTSTATUS 04/08/2019 FINAL 04/04/2019   GRAMSTAIN  04/04/2019    FEW WBC PRESENT,BOTH PMN AND MONONUCLEAR NO ORGANISMS SEEN    CULT  04/04/2019    FEW PSEUDOMONAS AERUGINOSA FEW SERRATIA MARCESCENS FEW STAPHYLOCOCCUS  AUREUS CRITICAL RESULT CALLED TO, READ BACK BY AND VERIFIED WITH: RN Suzan Garibaldi [phone] FCP NO ANAEROBES ISOLATED Performed at [facility], [place] [address], [place],  [address]    LABORGA PSEUDOMONAS AERUGINOSA 04/04/2019   LABORGA SERRATIA MARCESCENS 04/04/2019   LABORGA STAPHYLOCOCCUS AUREUS 04/04/2019     Lab Results  Component Value Date   ALBUMIN 3.9 03/01/2019   ALBUMIN 3.5 (L) 05/24/2017   ALBUMIN 4.2 11/23/2016    No results found for: MG No results found for: VD25OH  No results found for: PREALBUMIN CBC EXTENDED Latest Ref Rng & Units 03/01/2020 11/17/2019 10/01/2019  WBC 3.8 - 10.8 Thousand/uL 10.7 9.6 -  RBC 3.80 - 5.10 Million/uL 4.50 4.19 -  HGB 11.7 - 15.5 g/dL 12.6 12.0 10.8(L)  HCT 35.0 - 45.0 % 38.3 36.6 -  PLT 140 - 400 Thousand/uL 351 446(H) -  NEUTROABS 1,500 - 7,800 cells/uL 7,180 5,789 -  LYMPHSABS 850 - 3,900 cells/uL 2,482 2,736 -     Body mass index is 39.58 kg/m.  Orders:  No orders of the defined types were placed in this encounter.  No orders of the defined types were placed in this encounter.    Procedures: No procedures performed  Clinical Data: No additional findings.  ROS:  All other systems negative, except as noted in the HPI. Review of Systems  Objective: Vital  Signs: Ht 5\' 9"  (1.753 m)   Wt 268 lb (121.6 kg)   BMI 39.58 kg/m   Specialty Comments:  No specialty comments available.  PMFS History: Patient Active Problem List   Diagnosis Date Noted  . Osteomyelitis of foot ([place]) 04/04/2019  . Hardware complicating wound infection ([place])   . Subacute osteomyelitis, left ankle and foot ([place])   . Open displaced fracture of fifth metatarsal bone of left foot   . Healthcare maintenance 04/03/2019  . HIV disease ([place]) 10/27/2015  . DM type 2 (diabetes mellitus, type 2) ([place]) 10/27/2015  . Hyperlipidemia 10/27/2015  . Essential hypertension 10/27/2015  . Severe vulvar dysplasia, histologically confirmed  10/15/2015   Past Medical History:  Diagnosis Date  . Abnormal uterine bleeding (AUB)   . Arthritis    knees, fingers  . Asthma    as child, no problems in 20 yrs  . Cancer ([place])   . CKD (chronic kidney disease), stage II   . Congenital cardiomegaly    per pt has always been told since childhood and siblings   . GERD (gastroesophageal reflux disease)   . History of genital warts   . HIV (human immunodeficiency virus infection) ([place])    [place]-- DR Carlyle Basques  . Hypertension   . Intermittent palpitations    mild-- no meds  . Legally blind in left eye, as defined in [place]    trauma as child  . Tuberculosis    ? 2005 or 2007  . Type 2 diabetes mellitus (HCC)    Type II   . Uterine fibroid   . VIN III (vulvar intraepithelial neoplasia III)    and Verrucoid lesion on the mons  . Wears glasses     Family History  Problem Relation Age of Onset  . Hypertension Mother   . Diabetes Father   . Cancer Brother   . Breast cancer Cousin     Past Surgical History:  Procedure Laterality Date  . CESAREAN SECTION  2001  &  1984  . CO2 LASER APPLICATION N/A 55/06/3219   Procedure: CO2 LASER APPLICATION AND WIDE VOCAL EXCSION OF LESION ON MONS PUBIS;  Surgeon: Marti Sleigh, MD;  Location: [facility];  Service: Gynecology;  Laterality: N/A;   CASE CANCELLED   . CO2 LASER APPLICATION N/A 2/54/2706   Procedure: CO2 LASER OF VULVAR;  Surgeon: Marti Sleigh, MD;  Location[facility];  Service: Gynecology;  Laterality: N/A;  . CORNEAL TRANSPLANT Left 2006   failed  . D & C HYSTEROSCOPY /  RESECTION FIBROID  2004  . DILATATION & CURETTAGE/HYSTEROSCOPY WITH MYOSURE N/A 10/01/2019   Procedure: DILATATION & CURETTAGE/HYSTEROSCOPY WITH MYOSURE and HYDROTHERMAL ABLATION;  Surgeon: Thurnell Lose, MD;  Location: [facility];  Service: Gynecology;  Laterality: N/A;  HTA rep will be here. Confirmed on  09/25/19 CS  . HARDWARE REMOVAL Left 04/04/2019   Procedure: EXCISION BONE AND REMOVE DEEP HARDWARE LEFT 5TH METATARSAL;  Surgeon: Newt Minion, MD;  Location: [facility];  Service: Orthopedics;  Laterality: Left;  . KNEE ARTHROSCOPY W/ ACL RECONSTRUCTION Bilateral 2008  . ORIF TOE FRACTURE Left 08/02/2017   Procedure: OPEN REDUCTION INTERNAL FIXATION (ORIF) FIFTH METATARSAL (TOE) BASE FRACTURE;  Surgeon: Wylene Simmer, MD;  Location: [facility];  Service: Orthopedics;  Laterality: Left;  Marland Kitchen VULVECTOMY N/A 01/07/2016   Procedure: WIDE LOCAL EXCISION;  Surgeon: Marti Sleigh, MD;  Location[facility];  Service: Gynecology;  Laterality: N/A;  mons pubis as site   Social History   Occupational History  . Not on file  Tobacco Use  . Smoking status: Never Smoker  . Smokeless tobacco: Never Used  Substance and Sexual Activity  . Alcohol use: No    Alcohol/week: 0.0 standard drinks  . Drug use: No  . Sexual activity: Yes    Partners: Male    Birth control/protection: None, Condom    Comment: given condoms

## 2020-04-15 ENCOUNTER — Ambulatory Visit: Payer: 59

## 2020-04-15 ENCOUNTER — Other Ambulatory Visit: Payer: Self-pay

## 2020-04-29 ENCOUNTER — Ambulatory Visit: Payer: 59

## 2020-04-29 ENCOUNTER — Other Ambulatory Visit: Payer: Self-pay

## 2020-04-29 DIAGNOSIS — F4321 Adjustment disorder with depressed mood: Secondary | ICD-10-CM

## 2020-04-29 NOTE — Progress Notes (Unsigned)
Mental Health Therapist Progress Note   Name: Katrina Wolfe  Total time: 60  Type of Service: Individual Outpatient Mental Health Therapy  OBJECTIVE:  Mood: Euthymic and Affect: Appropriate Risk of harm to self or others: No plan to harm self or others  DIAGNOSIS:   GOALS ADDRESSED:  Patient will: 1.  Reduce symptoms of: depression  2.  Increase knowledge and/or ability of: coping skills  3.  Demonstrate ability to: Begin healthy grieving over loss  INTERVENTIONS: Interventions utilized:  Freight forwarder met with patient for outpatient mental health individual therapy to include ongoing assessment, support, and reinforcement.  Therapist allowed patient to "check in" since previous session; asking patient to share any positive coping skills they may have used over the previous week, along with any challenges faced.  Therapist provided supportive listening as patient processed their thoughts, emotional responses, and behaviors surrounding several stressors. Discussion centered on grief and how grief is expressed differently by her family members.  EFFECTIVENESS/PLAN: Client was alert, oriented x3, with no SI, HI, or symptoms of psychosis (risk low).  Client was pleasant and friendly, engaging openly and appropriately with therapist, benefiting from supportive listening and exploration of feelings.  Next session recommended in one to two weeks.   Hughes Better, LCSW

## 2020-05-10 ENCOUNTER — Other Ambulatory Visit: Payer: Self-pay

## 2020-05-10 ENCOUNTER — Ambulatory Visit (INDEPENDENT_AMBULATORY_CARE_PROVIDER_SITE_OTHER): Payer: 59 | Admitting: Physician Assistant

## 2020-05-10 ENCOUNTER — Encounter: Payer: Self-pay | Admitting: Orthopedic Surgery

## 2020-05-10 VITALS — Ht 69.5 in | Wt 268.0 lb

## 2020-05-10 DIAGNOSIS — L97521 Non-pressure chronic ulcer of other part of left foot limited to breakdown of skin: Secondary | ICD-10-CM

## 2020-05-10 NOTE — Progress Notes (Signed)
Office Visit Note   Patient: Katrina Wolfe           Date of Birth: December 25, 1965           MRN: [id number] Visit Date: 05/10/2020              Requested by: Wenda Low, MD [address] [place] [address] [place],  [place] [address] PCP: Wenda Low, MD  Chief Complaint  Patient presents with  . Left Foot - Follow-up    5th metatarsal excision and removal of hardware DOS 04/04/2019      HPI: This is a pleasant woman who follows up today for debridement of a chronic ulcer she has on the lateral aspect of her left foot.  She was last debrided about a month ago.  She did notice a slight odor in the last couple days.  Denies any fever or chills she is using her Consulting civil engineer & Plan: Visit Diagnoses: No diagnosis found.  Plan: Follow-up in 3 weeks or sooner if she notices any changes continue to apply antibiotic Iodosorb and a dry dressing  Follow-Up Instructions: No follow-ups on file.   Ortho Exam  Patient is alert, oriented, no adenopathy, well-dressed, normal affect, normal respiratory effort. Focused examination demonstrates fixed varus foot deformity.  Mild to moderate soft tissue swelling but no cellulitis.  She has a 1 x 1 cm ulcer that does not probe deeply.  This is surrounded by macerated white skin.  After obtaining verbal consent the macerated skin was debrided down to a healthy bleeding wound edge.  Silver nitrate stick was lightly applied.  Dry dressing was placed with Iodosorb there is no fluctuance no cellulitis the area does not probe deeply once debrided the area measured 2 cm by half a centimeter deep and surrounding macerated skin approximately 4 to 5 cm  Imaging: No results found. No images are attached to the encounter.  Labs: Lab Results  Component Value Date   REPTSTATUS 04/08/2019 FINAL 04/04/2019   GRAMSTAIN  04/04/2019    FEW WBC PRESENT,BOTH PMN AND MONONUCLEAR NO ORGANISMS SEEN    CULT  04/04/2019    FEW PSEUDOMONAS AERUGINOSA FEW  SERRATIA MARCESCENS FEW STAPHYLOCOCCUS AUREUS CRITICAL RESULT CALLED TO, READ BACK BY AND VERIFIED WITH: RN Suzan Garibaldi [phone] FCP NO ANAEROBES ISOLATED Performed at [facility], [place] [address], [place], [place] [address]    LABORGA PSEUDOMONAS AERUGINOSA 04/04/2019   LABORGA SERRATIA MARCESCENS 04/04/2019   LABORGA STAPHYLOCOCCUS AUREUS 04/04/2019     Lab Results  Component Value Date   ALBUMIN 3.9 03/01/2019   ALBUMIN 3.5 (L) 05/24/2017   ALBUMIN 4.2 11/23/2016    No results found for: MG No results found for: VD25OH  No results found for: PREALBUMIN CBC EXTENDED Latest Ref Rng & Units 03/01/2020 11/17/2019 10/01/2019  WBC 3.8 - 10.8 Thousand/uL 10.7 9.6 -  RBC 3.80 - 5.10 Million/uL 4.50 4.19 -  HGB 11.7 - 15.5 g/dL 12.6 12.0 10.8(L)  HCT 35.0 - 45.0 % 38.3 36.6 -  PLT 140 - 400 Thousand/uL 351 446(H) -  NEUTROABS 1,500 - 7,800 cells/uL 7,180 5,789 -  LYMPHSABS 850 - 3,900 cells/uL 2,482 2,736 -     Body mass index is 39.01 kg/m.  Orders:  No orders of the defined types were placed in this encounter.  No orders of the defined types were placed in this encounter.    Procedures: No procedures performed  Clinical Data: No additional findings.  ROS:  All other systems negative, except as noted in the HPI. Review of Systems  Objective: Vital Signs: Ht 5' 9.5" (1.765 m)   Wt 268 lb (121.6 kg)   BMI 39.01 kg/m   Specialty Comments:  No specialty comments available.  PMFS History: Patient Active Problem List   Diagnosis Date Noted  . Osteomyelitis of foot ([place]) 04/04/2019  . Hardware complicating wound infection ([place])   . Subacute osteomyelitis, left ankle and foot ([place])   . Open displaced fracture of fifth metatarsal bone of left foot   . Healthcare maintenance 04/03/2019  . HIV disease ([place]) 10/27/2015  . DM type 2 (diabetes mellitus, type 2) ([place]) 10/27/2015  . Hyperlipidemia 10/27/2015  . Essential hypertension 10/27/2015  . Severe  vulvar dysplasia, histologically confirmed 10/15/2015   Past Medical History:  Diagnosis Date  . Abnormal uterine bleeding (AUB)   . Arthritis    knees, fingers  . Asthma    as child, no problems in 20 yrs  . Cancer ([place])   . CKD (chronic kidney disease), stage II   . Congenital cardiomegaly    per pt has always been told since childhood and siblings   . GERD (gastroesophageal reflux disease)   . History of genital warts   . HIV (human immunodeficiency virus infection) ([place])    [place]-- DR Carlyle Basques  . Hypertension   . Intermittent palpitations    mild-- no meds  . Legally blind in left eye, as defined in [place]    trauma as child  . Tuberculosis    ? 2005 or 2007  . Type 2 diabetes mellitus (HCC)    Type II   . Uterine fibroid   . VIN III (vulvar intraepithelial neoplasia III)    and Verrucoid lesion on the mons  . Wears glasses     Family History  Problem Relation Age of Onset  . Hypertension Mother   . Diabetes Father   . Cancer Brother   . Breast cancer Cousin     Past Surgical History:  Procedure Laterality Date  . CESAREAN SECTION  2001  &  1984  . CO2 LASER APPLICATION N/A 22/08/7988   Procedure: CO2 LASER APPLICATION AND WIDE VOCAL EXCSION OF LESION ON MONS PUBIS;  Surgeon: Marti Sleigh, MD;  Location: [facility];  Service: Gynecology;  Laterality: N/A;   CASE CANCELLED   . CO2 LASER APPLICATION N/A 01/29/9416   Procedure: CO2 LASER OF VULVAR;  Surgeon: Marti Sleigh, MD;  Location[facility];  Service: Gynecology;  Laterality: N/A;  . CORNEAL TRANSPLANT Left 2006   failed  . D & C HYSTEROSCOPY /  RESECTION FIBROID  2004  . DILATATION & CURETTAGE/HYSTEROSCOPY WITH MYOSURE N/A 10/01/2019   Procedure: DILATATION & CURETTAGE/HYSTEROSCOPY WITH MYOSURE and HYDROTHERMAL ABLATION;  Surgeon: Thurnell Lose, MD;  Location: [facility];  Service: Gynecology;  Laterality:  N/A;  HTA rep will be here. Confirmed on 09/25/19 CS  . HARDWARE REMOVAL Left 04/04/2019   Procedure: EXCISION BONE AND REMOVE DEEP HARDWARE LEFT 5TH METATARSAL;  Surgeon: Newt Minion, MD;  Location: [facility];  Service: Orthopedics;  Laterality: Left;  . KNEE ARTHROSCOPY W/ ACL RECONSTRUCTION Bilateral 2008  . ORIF TOE FRACTURE Left 08/02/2017   Procedure: OPEN REDUCTION INTERNAL FIXATION (ORIF) FIFTH METATARSAL (TOE) BASE FRACTURE;  Surgeon: Wylene Simmer, MD;  Location: [facility];  Service: Orthopedics;  Laterality: Left;  Marland Kitchen VULVECTOMY N/A 01/07/2016  Procedure: WIDE LOCAL EXCISION;  Surgeon: Marti Sleigh, MD;  Location: [facility];  Service: Gynecology;  Laterality: N/A;  mons pubis as site   Social History   Occupational History  . Not on file  Tobacco Use  . Smoking status: Never Smoker  . Smokeless tobacco: Never Used  Substance and Sexual Activity  . Alcohol use: No    Alcohol/week: 0.0 standard drinks  . Drug use: No  . Sexual activity: Yes    Partners: Male    Birth control/protection: None, Condom    Comment: given condoms

## 2020-05-13 ENCOUNTER — Ambulatory Visit: Payer: 59

## 2020-05-13 ENCOUNTER — Other Ambulatory Visit: Payer: Self-pay

## 2020-05-27 ENCOUNTER — Ambulatory Visit: Payer: 59

## 2020-06-01 ENCOUNTER — Ambulatory Visit: Payer: 59

## 2020-06-07 ENCOUNTER — Ambulatory Visit (INDEPENDENT_AMBULATORY_CARE_PROVIDER_SITE_OTHER): Payer: 59 | Admitting: Physician Assistant

## 2020-06-07 ENCOUNTER — Encounter: Payer: Self-pay | Admitting: Orthopedic Surgery

## 2020-06-07 ENCOUNTER — Other Ambulatory Visit: Payer: Self-pay

## 2020-06-07 VITALS — Ht 69.5 in | Wt 268.0 lb

## 2020-06-07 DIAGNOSIS — L97411 Non-pressure chronic ulcer of right heel and midfoot limited to breakdown of skin: Secondary | ICD-10-CM

## 2020-06-07 NOTE — Progress Notes (Signed)
Office Visit Note   Patient: Katrina Wolfe           Date of Birth: 05-08-1966           MRN: [id number] Visit Date: 06/07/2020              Requested by: Wenda Low, MD [address] [place],  [place] [address] PCP: Wenda Low, MD  Chief Complaint  Patient presents with  . Left Foot - Follow-up    5th metatarsal excision and removal of hardware DOS 04/04/2019      HPI: This is a pleasant 54 year old woman who comes in follow-up for her left foot.  She has a history of a fixed cavovarus deformity and wears a Higher education careers adviser.  She continues to have a ulcer on the side.  She has been using Iodosorb and dry dressing she noticed a slight odor yesterday but none today  Assessment & Plan: Visit Diagnoses: No diagnosis found.  Plan: She will continue to do dressing changes follow-up in 3 weeks  Follow-Up Instructions: No follow-ups on file.   Ortho Exam  Patient is alert, oriented, no adenopathy, well-dressed, normal affect, normal respiratory effort. Focused examination demonstrates a 1-1/2 cm ulcer that probes just into the muscle.  Does not go deep nor does have any drainage it is surrounded by a significant amount of macerated skin no cellulitis after verbal consent was obtained some of the macerated skin was debrided to a bleeding surface hemostasis was achieved with silver nitrate stick and a dry dressing  Imaging: No results found. No images are attached to the encounter.  Labs: Lab Results  Component Value Date   REPTSTATUS 04/08/2019 FINAL 04/04/2019   GRAMSTAIN  04/04/2019    FEW WBC PRESENT,BOTH PMN AND MONONUCLEAR NO ORGANISMS SEEN    CULT  04/04/2019    FEW PSEUDOMONAS AERUGINOSA FEW SERRATIA MARCESCENS FEW STAPHYLOCOCCUS AUREUS CRITICAL RESULT CALLED TO, READ BACK BY AND VERIFIED WITH: RN Suzan Garibaldi [phone] FCP NO ANAEROBES ISOLATED Performed at [facility], [place] [address], [place], [place] [address]    LABORGA PSEUDOMONAS  AERUGINOSA 04/04/2019   LABORGA SERRATIA MARCESCENS 04/04/2019   LABORGA STAPHYLOCOCCUS AUREUS 04/04/2019     Lab Results  Component Value Date   ALBUMIN 3.9 03/01/2019   ALBUMIN 3.5 (L) 05/24/2017   ALBUMIN 4.2 11/23/2016    No results found for: MG No results found for: VD25OH  No results found for: PREALBUMIN CBC EXTENDED Latest Ref Rng & Units 03/01/2020 11/17/2019 10/01/2019  WBC 3.8 - 10.8 Thousand/uL 10.7 9.6 -  RBC 3.80 - 5.10 Million/uL 4.50 4.19 -  HGB 11.7 - 15.5 g/dL 12.6 12.0 10.8(L)  HCT 35 - 45 % 38.3 36.6 -  PLT 140 - 400 Thousand/uL 351 446(H) -  NEUTROABS 1,500 - 7,800 cells/uL 7,180 5,789 -  LYMPHSABS 850 - 3,900 cells/uL 2,482 2,736 -     Body mass index is 39.01 kg/m.  Orders:  No orders of the defined types were placed in this encounter.  No orders of the defined types were placed in this encounter.    Procedures: No procedures performed  Clinical Data: No additional findings.  ROS:  All other systems negative, except as noted in the HPI. Review of Systems  Objective: Vital Signs: Ht 5' 9.5" (1.765 m)   Wt 268 lb (121.6 kg)   BMI 39.01 kg/m   Specialty Comments:  No specialty comments available.  PMFS History: Patient Active Problem List  Diagnosis Date Noted  . Osteomyelitis of foot ([place]) 04/04/2019  . Hardware complicating wound infection ([place])   . Subacute osteomyelitis, left ankle and foot ([place])   . Open displaced fracture of fifth metatarsal bone of left foot   . Healthcare maintenance 04/03/2019  . HIV disease ([place]) 10/27/2015  . DM type 2 (diabetes mellitus, type 2) ([place]) 10/27/2015  . Hyperlipidemia 10/27/2015  . Essential hypertension 10/27/2015  . Severe vulvar dysplasia, histologically confirmed 10/15/2015   Past Medical History:  Diagnosis Date  . Abnormal uterine bleeding (AUB)   . Arthritis    knees, fingers  . Asthma    as child, no problems in 20 yrs  . Cancer ([place])   . CKD (chronic kidney disease),  stage II   . Congenital cardiomegaly    per pt has always been told since childhood and siblings   . GERD (gastroesophageal reflux disease)   . History of genital warts   . HIV (human immunodeficiency virus infection) ([place])    [place]-- DR Carlyle Basques  . Hypertension   . Intermittent palpitations    mild-- no meds  . Legally blind in left eye, as defined in [place]    trauma as child  . Tuberculosis    ? 2005 or 2007  . Type 2 diabetes mellitus (HCC)    Type II   . Uterine fibroid   . VIN III (vulvar intraepithelial neoplasia III)    and Verrucoid lesion on the mons  . Wears glasses     Family History  Problem Relation Age of Onset  . Hypertension Mother   . Diabetes Father   . Cancer Brother   . Breast cancer Cousin     Past Surgical History:  Procedure Laterality Date  . CESAREAN SECTION  2001  &  1984  . CO2 LASER APPLICATION N/A 78/01/9561   Procedure: CO2 LASER APPLICATION AND WIDE VOCAL EXCSION OF LESION ON MONS PUBIS;  Surgeon: Marti Sleigh, MD;  Location: [facility];  Service: Gynecology;  Laterality: N/A;   CASE CANCELLED   . CO2 LASER APPLICATION N/A 01/17/8656   Procedure: CO2 LASER OF VULVAR;  Surgeon: Marti Sleigh, MD;  Location: [facility];  Service: Gynecology;  Laterality: N/A;  . CORNEAL TRANSPLANT Left 2006   failed  . D & C HYSTEROSCOPY /  RESECTION FIBROID  2004  . DILATATION & CURETTAGE/HYSTEROSCOPY WITH MYOSURE N/A 10/01/2019   Procedure: DILATATION & CURETTAGE/HYSTEROSCOPY WITH MYOSURE and HYDROTHERMAL ABLATION;  Surgeon: Thurnell Lose, MD;  Location: [facility];  Service: Gynecology;  Laterality: N/A;  HTA rep will be here. Confirmed on 09/25/19 CS  . HARDWARE REMOVAL Left 04/04/2019   Procedure: EXCISION BONE AND REMOVE DEEP HARDWARE LEFT 5TH METATARSAL;  Surgeon: Newt Minion, MD;  Location: [facility];  Service: Orthopedics;  Laterality: Left;  . KNEE  ARTHROSCOPY W/ ACL RECONSTRUCTION Bilateral 2008  . ORIF TOE FRACTURE Left 08/02/2017   Procedure: OPEN REDUCTION INTERNAL FIXATION (ORIF) FIFTH METATARSAL (TOE) BASE FRACTURE;  Surgeon: Wylene Simmer, MD;  Location: [facility];  Service: Orthopedics;  Laterality: Left;  Marland Kitchen VULVECTOMY N/A 01/07/2016   Procedure: WIDE LOCAL EXCISION;  Surgeon: Marti Sleigh, MD;  Location: [facility];  Service: Gynecology;  Laterality: N/A;  mons pubis as site   Social History   Occupational History  . Not on file  Tobacco Use  . Smoking status: Never Smoker  . Smokeless tobacco:  Never Used  Vaping Use  . Vaping Use: Never used  Substance and Sexual Activity  . Alcohol use: No    Alcohol/week: 0.0 standard drinks  . Drug use: No  . Sexual activity: Yes    Partners: Male    Birth control/protection: None, Condom    Comment: given condoms

## 2020-06-15 ENCOUNTER — Other Ambulatory Visit: Payer: Self-pay

## 2020-06-15 ENCOUNTER — Ambulatory Visit: Payer: 59

## 2020-06-22 ENCOUNTER — Other Ambulatory Visit: Payer: 59

## 2020-07-05 ENCOUNTER — Ambulatory Visit (INDEPENDENT_AMBULATORY_CARE_PROVIDER_SITE_OTHER): Payer: 59 | Admitting: Orthopedic Surgery

## 2020-07-05 ENCOUNTER — Encounter: Payer: Self-pay | Admitting: Orthopedic Surgery

## 2020-07-05 ENCOUNTER — Other Ambulatory Visit: Payer: Self-pay

## 2020-07-05 VITALS — Ht 69.0 in | Wt 268.0 lb

## 2020-07-05 DIAGNOSIS — L97521 Non-pressure chronic ulcer of other part of left foot limited to breakdown of skin: Secondary | ICD-10-CM

## 2020-07-05 DIAGNOSIS — E11610 Type 2 diabetes mellitus with diabetic neuropathic arthropathy: Secondary | ICD-10-CM | POA: Diagnosis not present

## 2020-07-05 NOTE — Progress Notes (Signed)
Office Visit Note   Patient: Katrina Wolfe           Date of Birth: 05-Jun-1966           MRN: [id number] Visit Date: 07/05/2020              Requested by: Wenda Low, MD [address] [place],  [place] [address] PCP: Wenda Low, MD  Chief Complaint  Patient presents with  . Left Foot - Follow-up    04/04/2019 removal HDW excision       HPI: Patient is a 54 year old woman who presents in follow-up for left foot with Charcot collapse with cavovarus deformity.  Patient has a new Copywriter, advertising from [employer].  Patient is quite pleased with the boot feels well has no concerns.  Assessment & Plan: Visit Diagnoses:  1. Non-pressure chronic ulcer of other part of left foot limited to breakdown of skin ([place])   2. Charcot's arthropathy associated with type 2 diabetes mellitus ([place])     Plan: Continue with a Crow walker.  She will follow up if there is any acute changes.  Follow-Up Instructions: Return in about 2 months (around 09/05/2020).   Ortho Exam  Patient is alert, oriented, no adenopathy, well-dressed, normal affect, normal respiratory effort. Examination patient ambulates well with a Crow walker the ulcers over the lateral aspect of the left foot are stable these are 5 mm in diameter 1 mm deep x2 with healthy granulation tissue at the base no exposed bone or tendon no cellulitis no drainage.  Patient has a fixed cavovarus deformity with varus hindfoot.  Imaging: No results found. No images are attached to the encounter.  Labs: Lab Results  Component Value Date   REPTSTATUS 04/08/2019 FINAL 04/04/2019   GRAMSTAIN  04/04/2019    FEW WBC PRESENT,BOTH PMN AND MONONUCLEAR NO ORGANISMS SEEN    CULT  04/04/2019    FEW PSEUDOMONAS AERUGINOSA FEW SERRATIA MARCESCENS FEW STAPHYLOCOCCUS AUREUS CRITICAL RESULT CALLED TO, READ BACK BY AND VERIFIED WITH: RN Suzan Garibaldi [phone] FCP NO ANAEROBES ISOLATED Performed at [facility], [place] [address]  [place], [place] [address]    LABORGA PSEUDOMONAS AERUGINOSA 04/04/2019   LABORGA SERRATIA MARCESCENS 04/04/2019   LABORGA STAPHYLOCOCCUS AUREUS 04/04/2019     Lab Results  Component Value Date   ALBUMIN 3.9 03/01/2019   ALBUMIN 3.5 (L) 05/24/2017   ALBUMIN 4.2 11/23/2016    No results found for: MG No results found for: VD25OH  No results found for: PREALBUMIN CBC EXTENDED Latest Ref Rng & Units 03/01/2020 11/17/2019 10/01/2019  WBC 3.8 - 10.8 Thousand/uL 10.7 9.6 -  RBC 3.80 - 5.10 Million/uL 4.50 4.19 -  HGB 11.7 - 15.5 g/dL 12.6 12.0 10.8(L)  HCT 35 - 45 % 38.3 36.6 -  PLT 140 - 400 Thousand/uL 351 446(H) -  NEUTROABS 1,500 - 7,800 cells/uL 7,180 5,789 -  LYMPHSABS 850 - 3,900 cells/uL 2,482 2,736 -     Body mass index is 39.58 kg/m.  Orders:  No orders of the defined types were placed in this encounter.  No orders of the defined types were placed in this encounter.    Procedures: No procedures performed  Clinical Data: No additional findings.  ROS:  All other systems negative, except as noted in the HPI. Review of Systems  Objective: Vital Signs: Ht 5\' 9"  (1.753 m)   Wt 268 lb (121.6 kg)   BMI 39.58 kg/m   Specialty Comments:  No specialty  comments available.  PMFS History: Patient Active Problem List   Diagnosis Date Noted  . Osteomyelitis of foot ([place]) 04/04/2019  . Hardware complicating wound infection ([place])   . Subacute osteomyelitis, left ankle and foot ([place])   . Open displaced fracture of fifth metatarsal bone of left foot   . Healthcare maintenance 04/03/2019  . HIV disease ([place]) 10/27/2015  . DM type 2 (diabetes mellitus, type 2) ([place]) 10/27/2015  . Hyperlipidemia 10/27/2015  . Essential hypertension 10/27/2015  . Severe vulvar dysplasia, histologically confirmed 10/15/2015   Past Medical History:  Diagnosis Date  . Abnormal uterine bleeding (AUB)   . Arthritis    knees, fingers  . Asthma    as child, no problems in 20 yrs  .  Cancer ([place])   . CKD (chronic kidney disease), stage II   . Congenital cardiomegaly    per pt has always been told since childhood and siblings   . GERD (gastroesophageal reflux disease)   . History of genital warts   . HIV (human immunodeficiency virus infection) ([place])    [place]-- DR Carlyle Basques  . Hypertension   . Intermittent palpitations    mild-- no meds  . Legally blind in left eye, as defined in [place]    trauma as child  . Tuberculosis    ? 2005 or 2007  . Type 2 diabetes mellitus (HCC)    Type II   . Uterine fibroid   . VIN III (vulvar intraepithelial neoplasia III)    and Verrucoid lesion on the mons  . Wears glasses     Family History  Problem Relation Age of Onset  . Hypertension Mother   . Diabetes Father   . Cancer Brother   . Breast cancer Cousin     Past Surgical History:  Procedure Laterality Date  . CESAREAN SECTION  2001  &  1984  . CO2 LASER APPLICATION N/A 62/08/4764   Procedure: CO2 LASER APPLICATION AND WIDE VOCAL EXCSION OF LESION ON MONS PUBIS;  Surgeon: Marti Sleigh, MD;  Location: [facility];  Service: Gynecology;  Laterality: N/A;   CASE CANCELLED   . CO2 LASER APPLICATION N/A 4/65/0354   Procedure: CO2 LASER OF VULVAR;  Surgeon: Marti Sleigh, MD;  Location: [facility];  Service: Gynecology;  Laterality: N/A;  . CORNEAL TRANSPLANT Left 2006   failed  . D & C HYSTEROSCOPY /  RESECTION FIBROID  2004  . DILATATION & CURETTAGE/HYSTEROSCOPY WITH MYOSURE N/A 10/01/2019   Procedure: DILATATION & CURETTAGE/HYSTEROSCOPY WITH MYOSURE and HYDROTHERMAL ABLATION;  Surgeon: Thurnell Lose, MD;  Location: [facility];  Service: Gynecology;  Laterality: N/A;  HTA rep will be here. Confirmed on 09/25/19 CS  . HARDWARE REMOVAL Left 04/04/2019   Procedure: EXCISION BONE AND REMOVE DEEP HARDWARE LEFT 5TH METATARSAL;  Surgeon: Newt Minion, MD;  Location: [facility];   Service: Orthopedics;  Laterality: Left;  . KNEE ARTHROSCOPY W/ ACL RECONSTRUCTION Bilateral 2008  . ORIF TOE FRACTURE Left 08/02/2017   Procedure: OPEN REDUCTION INTERNAL FIXATION (ORIF) FIFTH METATARSAL (TOE) BASE FRACTURE;  Surgeon: Wylene Simmer, MD;  Location: [facility];  Service: Orthopedics;  Laterality: Left;  Marland Kitchen VULVECTOMY N/A 01/07/2016   Procedure: WIDE LOCAL EXCISION;  Surgeon: Marti Sleigh, MD;  Location: [facility];  Service: Gynecology;  Laterality: N/A;  mons pubis as site   Social History   Occupational History  . Not on file  Tobacco  Use  . Smoking status: Never Smoker  . Smokeless tobacco: Never Used  Vaping Use  . Vaping Use: Never used  Substance and Sexual Activity  . Alcohol use: No    Alcohol/week: 0.0 standard drinks  . Drug use: No  . Sexual activity: Yes    Partners: Male    Birth control/protection: None, Condom    Comment: given condoms

## 2020-07-06 ENCOUNTER — Other Ambulatory Visit: Payer: Self-pay

## 2020-07-06 ENCOUNTER — Ambulatory Visit: Payer: 59

## 2020-07-06 ENCOUNTER — Encounter: Payer: 59 | Admitting: Internal Medicine

## 2020-07-06 ENCOUNTER — Other Ambulatory Visit: Payer: 59

## 2020-07-06 DIAGNOSIS — B20 Human immunodeficiency virus [HIV] disease: Secondary | ICD-10-CM

## 2020-07-07 ENCOUNTER — Encounter: Payer: 59 | Admitting: Internal Medicine

## 2020-07-07 LAB — T-HELPER CELL (CD4) - (RCID CLINIC ONLY)
CD4 % Helper T Cell: 28 % — ABNORMAL LOW (ref 33–65)
CD4 T Cell Abs: 660 /uL (ref 400–1790)

## 2020-07-08 ENCOUNTER — Other Ambulatory Visit: Payer: 59

## 2020-07-16 LAB — CBC WITH DIFFERENTIAL/PLATELET
Absolute Monocytes: 551 cells/uL (ref 200–950)
Basophils Absolute: 32 cells/uL (ref 0–200)
Basophils Relative: 0.3 %
Eosinophils Absolute: 205 cells/uL (ref 15–500)
Eosinophils Relative: 1.9 %
HCT: 39.7 % (ref 35.0–45.0)
Hemoglobin: 13.5 g/dL (ref 11.7–15.5)
Lymphs Abs: 2473 cells/uL (ref 850–3900)
MCH: 31.3 pg (ref 27.0–33.0)
MCHC: 34 g/dL (ref 32.0–36.0)
MCV: 91.9 fL (ref 80.0–100.0)
MPV: 10.4 fL (ref 7.5–12.5)
Monocytes Relative: 5.1 %
Neutro Abs: 7538 cells/uL (ref 1500–7800)
Neutrophils Relative %: 69.8 %
Platelets: 370 10*3/uL (ref 140–400)
RBC: 4.32 10*6/uL (ref 3.80–5.10)
RDW: 15.6 % — ABNORMAL HIGH (ref 11.0–15.0)
Total Lymphocyte: 22.9 %
WBC: 10.8 10*3/uL (ref 3.8–10.8)

## 2020-07-16 LAB — COMPREHENSIVE METABOLIC PANEL
AG Ratio: 1.1 (calc) (ref 1.0–2.5)
ALT: 14 U/L (ref 6–29)
AST: 13 U/L (ref 10–35)
Albumin: 3.7 g/dL (ref 3.6–5.1)
Alkaline phosphatase (APISO): 74 U/L (ref 37–153)
BUN/Creatinine Ratio: 13 (calc) (ref 6–22)
BUN: 17 mg/dL (ref 7–25)
CO2: 27 mmol/L (ref 20–32)
Calcium: 9.7 mg/dL (ref 8.6–10.4)
Chloride: 104 mmol/L (ref 98–110)
Creat: 1.32 mg/dL — ABNORMAL HIGH (ref 0.50–1.05)
Globulin: 3.5 g/dL (calc) (ref 1.9–3.7)
Glucose, Bld: 129 mg/dL — ABNORMAL HIGH (ref 65–99)
Potassium: 4.1 mmol/L (ref 3.5–5.3)
Sodium: 141 mmol/L (ref 135–146)
Total Bilirubin: 0.4 mg/dL (ref 0.2–1.2)
Total Protein: 7.2 g/dL (ref 6.1–8.1)

## 2020-07-16 LAB — HIV-1 RNA QUANT-NO REFLEX-BLD
HIV 1 RNA Quant: <20 copies/mL
HIV-1 RNA Quant, Log: <1.3 Log copies/mL

## 2020-07-22 ENCOUNTER — Encounter: Payer: 59 | Admitting: Internal Medicine

## 2020-07-22 ENCOUNTER — Ambulatory Visit: Payer: 59

## 2020-08-05 ENCOUNTER — Ambulatory Visit: Payer: 59

## 2020-08-05 ENCOUNTER — Other Ambulatory Visit: Payer: Self-pay

## 2020-08-26 ENCOUNTER — Other Ambulatory Visit: Payer: Self-pay

## 2020-08-26 ENCOUNTER — Ambulatory Visit (INDEPENDENT_AMBULATORY_CARE_PROVIDER_SITE_OTHER): Payer: 59 | Admitting: Internal Medicine

## 2020-08-26 DIAGNOSIS — Z79899 Other long term (current) drug therapy: Secondary | ICD-10-CM | POA: Diagnosis not present

## 2020-08-26 DIAGNOSIS — B20 Human immunodeficiency virus [HIV] disease: Secondary | ICD-10-CM

## 2020-08-26 DIAGNOSIS — I10 Essential (primary) hypertension: Secondary | ICD-10-CM

## 2020-08-26 DIAGNOSIS — E1100 Type 2 diabetes mellitus with hyperosmolarity without nonketotic hyperglycemic-hyperosmolar coma (NKHHC): Secondary | ICD-10-CM | POA: Diagnosis not present

## 2020-08-26 DIAGNOSIS — Z794 Long term (current) use of insulin: Secondary | ICD-10-CM

## 2020-08-26 NOTE — Progress Notes (Signed)
RFV: follow up   Patient ID: Katrina Wolfe, female   DOB: [birth year], 54 y.o.   MRN: [id number]  HPI Mayda is a 54yo F with obesity, diabetes, left eye blindness. Well controlled hiv disease, CD 4 count 660/VL<20, on biktarvy. Has excellent adherence. She states that she was to undergo enucleation of left eye however had issues with ophtho and insurance coverage. Occasionally still has left eye pain.  Outpatient Encounter Medications as of 08/26/2020  Medication Sig  . acetaminophen (TYLENOL) 500 MG tablet Take 2 tablets (1,000 mg total) by mouth every 8 (eight) hours as needed for moderate pain.  Marland Kitchen aspirin EC 81 MG tablet Take 1 tablet (81 mg total) by mouth 2 (two) times daily.  Marland Kitchen aspirin-acetaminophen-caffeine (EXCEDRIN MIGRAINE) 250-250-65 MG tablet Take 2 tablets by mouth daily as needed for headache or migraine.  Marland Kitchen atorvastatin (LIPITOR) 10 MG tablet Take 10 mg by mouth every morning.   Marland Kitchen atropine 1 % ophthalmic solution Place 1 drop into both eyes 2 (two) times daily.  . bictegravir-emtricitabine-tenofovir AF (BIKTARVY) 50-200-25 MG TABS tablet TAKE ONE TABLET BY MOUTH EVERY EVENING WITH DINNER  . calcium carbonate (TUMS - DOSED IN MG ELEMENTAL CALCIUM) 500 MG chewable tablet Chew 1 tablet by mouth as needed for indigestion or heartburn.  . cloNIDine (CATAPRES) 0.3 MG tablet Take 0.3 mg by mouth 2 (two) times daily.   Marland Kitchen gabapentin (NEURONTIN) 100 MG capsule Take 100 mg by mouth 3 (three) times daily.  Marland Kitchen glimepiride (AMARYL) 2 MG tablet Take 2 mg by mouth daily.  Marland Kitchen HUMALOG MIX 75/25 KWIKPEN (75-25) 100 UNIT/ML Kwikpen Inject 56 Units into the skin 2 (two) times daily.   . Insulin Syringe-Needle U-100 (INSULIN SYRINGE 1CC/31GX5/16") 31G X 5/16" 1 ML MISC 1 Units by Does not apply route 2 (two) times daily.  Marland Kitchen JARDIANCE 10 MG TABS tablet Take 10 mg by mouth every morning.  . Lancets (ONETOUCH ULTRASOFT) lancets   . lisinopril-hydrochlorothiazide (PRINZIDE,ZESTORETIC) 20-25 MG tablet  Take 1 tablet by mouth daily.  . ONE TOUCH ULTRA TEST test strip   . VICTOZA 18 MG/3ML SOPN Inject 1.8 mg into the skin every morning.    No facility-administered encounter medications on file as of 08/26/2020.     Patient Active Problem List   Diagnosis Date Noted  . Osteomyelitis of foot ([place]) 04/04/2019  . Hardware complicating wound infection ([place])   . Subacute osteomyelitis, left ankle and foot ([place])   . Open displaced fracture of fifth metatarsal bone of left foot   . Healthcare maintenance 04/03/2019  . HIV disease ([place]) 10/27/2015  . DM type 2 (diabetes mellitus, type 2) ([place]) 10/27/2015  . Hyperlipidemia 10/27/2015  . Essential hypertension 10/27/2015  . Severe vulvar dysplasia, histologically confirmed 10/15/2015     Health Maintenance Due  Topic Date Due  . HEMOGLOBIN A1C  Never done  . FOOT EXAM  Never done  . OPHTHALMOLOGY EXAM  Never done  . COVID-19 Vaccine (1) Never done  . COLONOSCOPY  Never done  . INFLUENZA VACCINE  07/18/2020     Review of Systems 12 point ros is negative except what is mentioned above Physical Exam   p 86, BP 128/ 78 Physical Exam  Constitutional:  oriented to person, place, and time. appears well-developed and well-nourished. No distress.  HENT: [place]/AT, left eye cloudy no vision , no scleral icterus Mouth/Throat: Oropharynx is clear and moist. No oropharyngeal exudate.  Cardiovascular: Normal rate, regular rhythm and normal heart sounds. Exam reveals  no gallop and no friction rub.  No murmur heard.  Pulmonary/Chest: Effort normal and breath sounds normal. No respiratory distress.  has no wheezes.  Neck = supple, no nuchal rigidity Abdominal: Soft. Bowel sounds are normal.  exhibits no distension. There is no tenderness.  Lymphadenopathy: no cervical adenopathy. No axillary adenopathy Neurological: alert and oriented to person, place, and time.  Skin: Skin is warm and dry. No rash noted. No erythema.  Psychiatric: a normal mood and  affect.  behavior is normal.    Lab Results  Component Value Date   CD4TCELL 28 (L) 07/06/2020   Lab Results  Component Value Date   CD4TABS 660 07/06/2020   CD4TABS 563 03/01/2020   CD4TABS 759 11/17/2019   Lab Results  Component Value Date   HIV1RNAQUANT <20 NOT DETECTED 07/06/2020   Lab Results  Component Value Date   HEPBSAB NEG 10/13/2015   Lab Results  Component Value Date   LABRPR NON-REACTIVE 03/01/2020    CBC Lab Results  Component Value Date   WBC 10.8 07/06/2020   RBC 4.32 07/06/2020   HGB 13.5 07/06/2020   HCT 39.7 07/06/2020   PLT 370 07/06/2020   MCV 91.9 07/06/2020   MCH 31.3 07/06/2020   MCHC 34.0 07/06/2020   RDW 15.6 (H) 07/06/2020   LYMPHSABS 2,473 07/06/2020   MONOABS 1.0 03/01/2019   EOSABS 205 07/06/2020    BMET Lab Results  Component Value Date   NA 141 07/06/2020   K 4.1 07/06/2020   CL 104 07/06/2020   CO2 27 07/06/2020   GLUCOSE 129 (H) 07/06/2020   BUN 17 07/06/2020   CREATININE 1.32 (H) 07/06/2020   CALCIUM 9.7 07/06/2020   GFRNONAA 64 03/01/2020   GFRAA 74 03/01/2020      Assessment and Plan HIV disease = well controlled, continue on biktarvy  Health maintenance = - deferred both flu and covid 19  History of abn pap = Seeing gyn in oct for U/S  IDDM2 = has had some recent elevated BS on labs, planning Medication management- diabetes and HTN  Needs to get enuclation to left eye but still finding difficulty with insurance coverage  htn = well controlled. No change in current regimen

## 2020-09-02 ENCOUNTER — Ambulatory Visit: Payer: 59

## 2020-09-06 ENCOUNTER — Encounter: Payer: Self-pay | Admitting: Orthopedic Surgery

## 2020-09-06 ENCOUNTER — Ambulatory Visit (INDEPENDENT_AMBULATORY_CARE_PROVIDER_SITE_OTHER): Payer: 59 | Admitting: Physician Assistant

## 2020-09-06 VITALS — Ht 69.0 in | Wt 268.0 lb

## 2020-09-06 DIAGNOSIS — L84 Corns and callosities: Secondary | ICD-10-CM

## 2020-09-06 NOTE — Progress Notes (Signed)
Office Visit Note   Patient: Katrina Wolfe           Date of Birth: Dec 29, 1965           MRN: [id number] Visit Date: 09/06/2020              Requested by: Wenda Low, MD [address] [place],  [place] [address] PCP: Wenda Low, MD  Chief Complaint  Patient presents with  . Left Foot - Follow-up    62month follow up on Charcot collapse and cavovarus. S/p hardware excision 04/04/2019      HPI: Patient presents in follow-up today.  She has left foot Charcot collapse with hardware excision 18 months ago.  She is using a Higher education careers adviser.  She feels this is giving her foot better support.  She has 1 very small area on the lateral side of the foot that she just gets a small amount of bloody drainage.  No other complaints no fever no chills  Assessment & Plan: Visit Diagnoses: No diagnosis found.  Plan: Continue with Milinda Cave boot follow-up in 6 weeks.  Sooner if any issues arise Follow-Up Instructions: No follow-ups on file.   Ortho Exam  Patient is alert, oriented, no adenopathy, well-dressed, normal affect, normal respiratory effort. Left foot: Fixed cavovarus foot deformity secondary to Charcot.  She has some callusing on the lateral side of her foot.  No surrounding cellulitis.  On the plantar lateral side of her foot she has a small half centimeter in diameter by 3 mm area.  There is does not probe deeply has some surrounding callus.  No foul odor.  No indications of any infective process.  After obtaining verbal permission the callus on the lateral side of her foot was debrided to healthy surfaces she will follow-up in 6 weeks  Imaging: No results found. No images are attached to the encounter.  Labs: Lab Results  Component Value Date   REPTSTATUS 04/08/2019 FINAL 04/04/2019   GRAMSTAIN  04/04/2019    FEW WBC PRESENT,BOTH PMN AND MONONUCLEAR NO ORGANISMS SEEN    CULT  04/04/2019    FEW PSEUDOMONAS AERUGINOSA FEW SERRATIA MARCESCENS FEW STAPHYLOCOCCUS  AUREUS CRITICAL RESULT CALLED TO, READ BACK BY AND VERIFIED WITH: RN Suzan Garibaldi [phone] FCP NO ANAEROBES ISOLATED Performed at [facility], [place] [address] [place], [place] [address]    LABORGA PSEUDOMONAS AERUGINOSA 04/04/2019   LABORGA SERRATIA MARCESCENS 04/04/2019   LABORGA STAPHYLOCOCCUS AUREUS 04/04/2019     Lab Results  Component Value Date   ALBUMIN 3.9 03/01/2019   ALBUMIN 3.5 (L) 05/24/2017   ALBUMIN 4.2 11/23/2016    No results found for: MG No results found for: VD25OH  No results found for: PREALBUMIN CBC EXTENDED Latest Ref Rng & Units 07/06/2020 03/01/2020 11/17/2019  WBC 3.8 - 10.8 Thousand/uL 10.8 10.7 9.6  RBC 3.80 - 5.10 Million/uL 4.32 4.50 4.19  HGB 11.7 - 15.5 g/dL 13.5 12.6 12.0  HCT 35 - 45 % 39.7 38.3 36.6  PLT 140 - 400 Thousand/uL 370 351 446(H)  NEUTROABS 1,500 - 7,800 cells/uL 7,538 7,180 5,789  LYMPHSABS 850 - 3,900 cells/uL 2,473 2,482 2,736     Body mass index is 39.58 kg/m.  Orders:  No orders of the defined types were placed in this encounter.  No orders of the defined types were placed in this encounter.    Procedures: No procedures performed  Clinical Data: No additional findings.  ROS:  All other systems negative, except  as noted in the HPI. Review of Systems  Objective: Vital Signs: Ht 5\' 9"  (1.753 m)   Wt 268 lb (121.6 kg)   BMI 39.58 kg/m   Specialty Comments:  No specialty comments available.  PMFS History: Patient Active Problem List   Diagnosis Date Noted  . Osteomyelitis of foot ([place]) 04/04/2019  . Hardware complicating wound infection ([place])   . Subacute osteomyelitis, left ankle and foot ([place])   . Open displaced fracture of fifth metatarsal bone of left foot   . Healthcare maintenance 04/03/2019  . HIV disease ([place]) 10/27/2015  . DM type 2 (diabetes mellitus, type 2) ([place]) 10/27/2015  . Hyperlipidemia 10/27/2015  . Essential hypertension 10/27/2015  . Severe vulvar dysplasia, histologically  confirmed 10/15/2015   Past Medical History:  Diagnosis Date  . Abnormal uterine bleeding (AUB)   . Arthritis    knees, fingers  . Asthma    as child, no problems in 20 yrs  . Cancer ([place])   . CKD (chronic kidney disease), stage II   . Congenital cardiomegaly    per pt has always been told since childhood and siblings   . GERD (gastroesophageal reflux disease)   . History of genital warts   . HIV (human immunodeficiency virus infection) ([place])    [place]-- DR Carlyle Basques  . Hypertension   . Intermittent palpitations    mild-- no meds  . Legally blind in left eye, as defined in [place]    trauma as child  . Tuberculosis    ? 2005 or 2007  . Type 2 diabetes mellitus ([facility])    Type II   . Uterine fibroid   . VIN III (vulvar intraepithelial neoplasia III)    and Verrucoid lesion on the mons  . Wears glasses     Family History  Problem Relation Age of Onset  . Hypertension Mother   . Diabetes Father   . Cancer Brother   . Breast cancer Cousin     Past Surgical History:  Procedure Laterality Date  . CESAREAN SECTION  2001  &  1984  . CO2 LASER APPLICATION N/A 90/01/4096   Procedure: CO2 LASER APPLICATION AND WIDE VOCAL EXCSION OF LESION ON MONS PUBIS;  Surgeon: Marti Sleigh, MD;  Location: [facility];  Service: Gynecology;  Laterality: N/A;   CASE CANCELLED   . CO2 LASER APPLICATION N/A 3/53/2992   Procedure: CO2 LASER OF VULVAR;  Surgeon: Marti Sleigh, MD;  Location: [facility];  Service: Gynecology;  Laterality: N/A;  . CORNEAL TRANSPLANT Left 2006   failed  . D & C HYSTEROSCOPY /  RESECTION FIBROID  2004  . DILATATION & CURETTAGE/HYSTEROSCOPY WITH MYOSURE N/A 10/01/2019   Procedure: DILATATION & CURETTAGE/HYSTEROSCOPY WITH MYOSURE and HYDROTHERMAL ABLATION;  Surgeon: Thurnell Lose, MD;  Location: [facility];  Service: Gynecology;  Laterality: N/A;  HTA rep will be here.  Confirmed on 09/25/19 CS  . HARDWARE REMOVAL Left 04/04/2019   Procedure: EXCISION BONE AND REMOVE DEEP HARDWARE LEFT 5TH METATARSAL;  Surgeon: Newt Minion, MD;  Location: [facility];  Service: Orthopedics;  Laterality: Left;  . KNEE ARTHROSCOPY W/ ACL RECONSTRUCTION Bilateral 2008  . ORIF TOE FRACTURE Left 08/02/2017   Procedure: OPEN REDUCTION INTERNAL FIXATION (ORIF) FIFTH METATARSAL (TOE) BASE FRACTURE;  Surgeon: Wylene Simmer, MD;  Location: [facility];  Service: Orthopedics;  Laterality: Left;  Marland Kitchen VULVECTOMY N/A 01/07/2016   Procedure: WIDE LOCAL EXCISION;  Surgeon: Marti Sleigh, MD;  Location: [facility];  Service: Gynecology;  Laterality: N/A;  mons pubis as site   Social History   Occupational History  . Not on file  Tobacco Use  . Smoking status: Never Smoker  . Smokeless tobacco: Never Used  Vaping Use  . Vaping Use: Never used  Substance and Sexual Activity  . Alcohol use: No    Alcohol/week: 0.0 standard drinks  . Drug use: No  . Sexual activity: Yes    Partners: Male    Birth control/protection: None, Condom    Comment: given condoms

## 2020-09-09 ENCOUNTER — Ambulatory Visit: Payer: 59

## 2020-09-09 ENCOUNTER — Other Ambulatory Visit: Payer: Self-pay

## 2020-09-22 ENCOUNTER — Other Ambulatory Visit: Payer: Self-pay | Admitting: Internal Medicine

## 2020-09-22 DIAGNOSIS — B20 Human immunodeficiency virus [HIV] disease: Secondary | ICD-10-CM

## 2020-10-07 ENCOUNTER — Other Ambulatory Visit: Payer: Self-pay

## 2020-10-07 ENCOUNTER — Ambulatory Visit: Payer: 59

## 2020-10-21 ENCOUNTER — Other Ambulatory Visit: Payer: Self-pay

## 2020-10-21 ENCOUNTER — Encounter (HOSPITAL_COMMUNITY): Payer: Self-pay | Admitting: Emergency Medicine

## 2020-10-21 ENCOUNTER — Emergency Department (HOSPITAL_COMMUNITY)
Admission: EM | Admit: 2020-10-21 | Discharge: 2020-10-21 | Disposition: A | Discharge status: Home / Self Care | Payer: 59 | Attending: Emergency Medicine | Admitting: Emergency Medicine

## 2020-10-21 DIAGNOSIS — Z21 Asymptomatic human immunodeficiency virus [HIV] infection status: Secondary | ICD-10-CM | POA: Insufficient documentation

## 2020-10-21 DIAGNOSIS — Z947 Corneal transplant status: Secondary | ICD-10-CM | POA: Diagnosis not present

## 2020-10-21 DIAGNOSIS — H5712 Ocular pain, left eye: Secondary | ICD-10-CM | POA: Insufficient documentation

## 2020-10-21 DIAGNOSIS — I129 Hypertensive chronic kidney disease with stage 1 through stage 4 chronic kidney disease, or unspecified chronic kidney disease: Secondary | ICD-10-CM | POA: Diagnosis not present

## 2020-10-21 DIAGNOSIS — Z7982 Long term (current) use of aspirin: Secondary | ICD-10-CM | POA: Diagnosis not present

## 2020-10-21 DIAGNOSIS — H02846 Edema of left eye, unspecified eyelid: Secondary | ICD-10-CM | POA: Diagnosis not present

## 2020-10-21 DIAGNOSIS — Z79899 Other long term (current) drug therapy: Secondary | ICD-10-CM | POA: Diagnosis not present

## 2020-10-21 DIAGNOSIS — E1122 Type 2 diabetes mellitus with diabetic chronic kidney disease: Secondary | ICD-10-CM | POA: Insufficient documentation

## 2020-10-21 DIAGNOSIS — Z859 Personal history of malignant neoplasm, unspecified: Secondary | ICD-10-CM | POA: Insufficient documentation

## 2020-10-21 DIAGNOSIS — Z794 Long term (current) use of insulin: Secondary | ICD-10-CM | POA: Diagnosis not present

## 2020-10-21 DIAGNOSIS — N182 Chronic kidney disease, stage 2 (mild): Secondary | ICD-10-CM | POA: Diagnosis not present

## 2020-10-21 DIAGNOSIS — H0289 Other specified disorders of eyelid: Secondary | ICD-10-CM

## 2020-10-21 DIAGNOSIS — J45909 Unspecified asthma, uncomplicated: Secondary | ICD-10-CM | POA: Insufficient documentation

## 2020-10-21 MED ORDER — HYDROCODONE-ACETAMINOPHEN 5-325 MG PO TABS: 1.0000 | ORAL_TABLET | Freq: Four times a day (QID) | ORAL | 0 refills | Status: DC | PRN

## 2020-10-21 MED ORDER — TETRACAINE HCL 0.5 % OP SOLN
1.0000 [drp] | Freq: Once | OPHTHALMIC | Status: DC
  Filled 2020-10-21: qty 4

## 2020-10-21 NOTE — ED Notes (Signed)
Attempted to complete visual acuity screening, however, pt is completely blind in her L eye.

## 2020-10-21 NOTE — ED Triage Notes (Signed)
Patient states she had a previous cornea transplant that failed, she was giving eye drops, but they are no longer working and her eye hurts.

## 2020-10-21 NOTE — Discharge Instructions (Signed)
Please continue using the erythromycin ointment and use warm compresses over your eye. Use medicine for pain as prescribed for severe pain.  Otherwise use over-the-counter medications.  Follow-up with your eye specialist next week as planned.

## 2020-10-21 NOTE — ED Provider Notes (Signed)
[facility] DEPT Provider Note   CSN: [id number] Arrival date & time: 10/21/20  1316     History No chief complaint on file.   Katrina Wolfe is a 54 y.o. female.  Patient with history of HIV, chronic kidney disease, previous left eye surgery presents emergency department with left eye pain.  Patient has had chronic pain in his thigh.  She has been applying erythromycin ointment which has improved her symptoms up until recently.  Over the past day she has noted swelling to her left upper eyelid.  No fevers, nausea or vomiting.  She is due to follow-up with [facility] for evaluation and she states, likely removal of the eye in 4 days.       Past Medical History:  Diagnosis Date  . Abnormal uterine bleeding (AUB)   . Arthritis    knees, fingers  . Asthma    as child, no problems in 20 yrs  . Cancer ([place])   . CKD (chronic kidney disease), stage II   . Congenital cardiomegaly    per pt has always been told since childhood and siblings   . GERD (gastroesophageal reflux disease)   . History of genital warts   . HIV (human immunodeficiency virus infection) ([place])    [place]-- DR Carlyle Basques  . Hypertension   . Intermittent palpitations    mild-- no meds  . Legally blind in left eye, as defined in [place]    trauma as child  . Tuberculosis    ? 2005 or 2007  . Type 2 diabetes mellitus (HCC)    Type II   . Uterine fibroid   . VIN III (vulvar intraepithelial neoplasia III)    and Verrucoid lesion on the mons  . Wears glasses     Patient Active Problem List   Diagnosis Date Noted  . Osteomyelitis of foot ([place]) 04/04/2019  . Hardware complicating wound infection ([place])   . Subacute osteomyelitis, left ankle and foot ([place])   . Open displaced fracture of fifth metatarsal bone of left foot   . Healthcare maintenance 04/03/2019  . HIV disease ([place]) 10/27/2015  . DM type 2 (diabetes mellitus, type 2) ([place]) 10/27/2015  .  Hyperlipidemia 10/27/2015  . Essential hypertension 10/27/2015  . Severe vulvar dysplasia, histologically confirmed 10/15/2015    Past Surgical History:  Procedure Laterality Date  . CESAREAN SECTION  2001  &  1984  . CO2 LASER APPLICATION N/A 48/04/4626   Procedure: CO2 LASER APPLICATION AND WIDE VOCAL EXCSION OF LESION ON MONS PUBIS;  Surgeon: Marti Sleigh, MD;  Location: [facility];  Service: Gynecology;  Laterality: N/A;   CASE CANCELLED   . CO2 LASER APPLICATION N/A 0/35/0093   Procedure: CO2 LASER OF VULVAR;  Surgeon: Marti Sleigh, MD;  Location: [facility];  Service: Gynecology;  Laterality: N/A;  . CORNEAL TRANSPLANT Left 2006   failed  . D & C HYSTEROSCOPY /  RESECTION FIBROID  2004  . DILATATION & CURETTAGE/HYSTEROSCOPY WITH MYOSURE N/A 10/01/2019   Procedure: DILATATION & CURETTAGE/HYSTEROSCOPY WITH MYOSURE and HYDROTHERMAL ABLATION;  Surgeon: Thurnell Lose, MD;  Location: [place];  Service: Gynecology;  Laterality: N/A;  HTA rep will be here. Confirmed on 09/25/19 CS  . HARDWARE REMOVAL Left 04/04/2019   Procedure: EXCISION BONE AND REMOVE DEEP HARDWARE LEFT 5TH METATARSAL;  Surgeon: Newt Minion, MD;  Location: [facility];  Service: Orthopedics;  Laterality: Left;  . KNEE  ARTHROSCOPY W/ ACL RECONSTRUCTION Bilateral 2008  . ORIF TOE FRACTURE Left 08/02/2017   Procedure: OPEN REDUCTION INTERNAL FIXATION (ORIF) FIFTH METATARSAL (TOE) BASE FRACTURE;  Surgeon: Wylene Simmer, MD;  Location: [facility];  Service: Orthopedics;  Laterality: Left;  Marland Kitchen VULVECTOMY N/A 01/07/2016   Procedure: WIDE LOCAL EXCISION;  Surgeon: Marti Sleigh, MD;  Location: [facility];  Service: Gynecology;  Laterality: N/A;  mons pubis as site     OB History   No obstetric history on file.     Family History  Problem Relation Age of Onset  . Hypertension Mother   . Diabetes Father   . Cancer Brother   .  Breast cancer Cousin     Social History   Tobacco Use  . Smoking status: Never Smoker  . Smokeless tobacco: Never Used  Vaping Use  . Vaping Use: Never used  Substance Use Topics  . Alcohol use: No    Alcohol/week: 0.0 standard drinks  . Drug use: No    Home Medications Prior to Admission medications   Medication Sig Start Date End Date Taking? Authorizing Provider  aspirin EC 81 MG tablet Take 1 tablet (81 mg total) by mouth 2 (two) times daily. 08/02/17   Corky Sing, PA-C  atorvastatin (LIPITOR) 10 MG tablet Take 10 mg by mouth every morning.  10/12/15   [provider]  atropine 1 % ophthalmic solution Place 1 drop into both eyes 2 (two) times daily.    [provider]  BIKTARVY 50-200-25 MG TABS tablet TAKE ONE TABLET BY MOUTH every evening with dinner 09/22/20   Carlyle Basques, MD  calcium carbonate (TUMS - DOSED IN MG ELEMENTAL CALCIUM) 500 MG chewable tablet Chew 1 tablet by mouth as needed for indigestion or heartburn.    [provider]  cloNIDine (CATAPRES) 0.3 MG tablet Take 0.3 mg by mouth 2 (two) times daily.  10/06/15   [provider]  gabapentin (NEURONTIN) 100 MG capsule Take 100 mg by mouth 3 (three) times daily.    [provider]  glimepiride (AMARYL) 2 MG tablet Take 2 mg by mouth daily.    [provider]  HUMALOG MIX 75/25 KWIKPEN (75-25) 100 UNIT/ML Kwikpen Inject 56 Units into the skin 2 (two) times daily.  10/06/15   [provider]  HYDROcodone-acetaminophen (NORCO/VICODIN) 5-325 MG tablet Take 1 tablet by mouth every 6 (six) hours as needed for severe pain. 10/21/20   Carlisle Cater, PA-C  Insulin Syringe-Needle U-100 (INSULIN SYRINGE 1CC/31GX5/16") 31G X 5/16" 1 ML MISC 1 Units by Does not apply route 2 (two) times daily. 11/26/15   Forde Dandy, MD  JARDIANCE 10 MG TABS tablet Take 10 mg by mouth every morning. 11/14/19   [provider]  Lancets Glory Rosebush ULTRASOFT) lancets   11/29/15   [provider]  lisinopril-hydrochlorothiazide (PRINZIDE,ZESTORETIC) 20-25 MG tablet Take 1 tablet by mouth daily.    [provider]  ONE TOUCH ULTRA TEST test strip  11/29/15   [provider]  VICTOZA 18 MG/3ML SOPN Inject 1.8 mg into the skin every morning.  12/13/17   [provider]    Allergies    Shellfish allergy and Bactrim [sulfamethoxazole-trimethoprim]  Review of Systems   Review of Systems  Constitutional: Negative for fever.  HENT: Positive for ear pain. Negative for ear discharge, rhinorrhea and sore throat.   Eyes: Positive for redness. Negative for discharge and itching.  Gastrointestinal: Negative for nausea and vomiting.  Musculoskeletal: Negative for myalgias.  Neurological: Positive for headaches.    Physical Exam Updated Vital Signs BP (!) 168/105 (BP Location: Right Arm)   Pulse (!) 115   Temp 99.3 F (37.4 C) (Oral)   Resp 18   Ht 5\' 9"  (1.753 m)   Wt 121 kg   SpO2 96%   BMI 39.39 kg/m   Physical Exam Vitals and nursing note reviewed.  Constitutional:      Appearance: She is well-developed.  HENT:     Head: Normocephalic and atraumatic.  Eyes:     Comments: Patient with chronic scarring of the left cornea and nonreactivity of the pupil.  Sclera is injected.  There is mild swelling of the left upper eyelid with a palpable nodule, mobile, laterally.  No overlying erythema or redness.  No active drainage.  No signs of cellulitis.  Pulmonary:     Effort: No respiratory distress.  Musculoskeletal:     Cervical back: Normal range of motion and neck supple.  Skin:    General: Skin is warm and dry.  Neurological:     Mental Status: She is alert.     ED Results / Procedures / Treatments   Labs (all labs ordered are listed, but only abnormal results are displayed) Labs Reviewed - No data to display  EKG None  Radiology No results found.  Procedures Procedures (including critical care time)   Medications Ordered in ED Medications  tetracaine (PONTOCAINE) 0.5 % ophthalmic solution 1 drop (has no administration in time range)    ED Course  I have reviewed the triage vital signs and the nursing notes.  Pertinent labs & imaging results that were available during my care of the patient were reviewed by me and considered in my medical decision making (see chart for details).  Patient seen and examined.  She will benefit from eye specialist follow-up as planned.  Will prescribe Vicodin #6 tabs for pain.  Patient encouraged to continue using erythromycin ointment for lubrication purposes.  She should also use warm compresses over her left eye and eyelid.  Encouraged follow-up if she develops redness, swelling, or fever.  Vital signs reviewed and are as follows: BP (!) 168/105 (BP Location: Right Arm)   Pulse (!) 115   Temp 99.3 F (37.4 C) (Oral)   Resp 18   Ht 5\' 9"  (1.753 m)   Wt 121 kg   SpO2 96%   BMI 39.39 kg/m   Patient counseled on use of narcotic pain medications. Counseled not to combine these medications with others containing tylenol. Urged not to drink alcohol, drive, or perform any other activities that requires focus while taking these medications. The patient verbalizes understanding and agrees with the plan.     MDM Rules/Calculators/A&P                          Patient with left eye blindness presents with worsening left eye pain.  She has swelling of the upper eyelid.  Does not appear to be a stye as the palpable area is mobile.  No overlying redness or erythema to suggest cellulitis, preseptal cellulitis, or orbital cellulitis.   Final Clinical Impression(s) / ED Diagnoses Final diagnoses:  Pain and swelling of eyelid of left eye    Rx / DC Orders ED Discharge Orders         Ordered    HYDROcodone-acetaminophen (NORCO/VICODIN) 5-325 MG tablet  Every 6 hours PRN  10/21/20 1421           Carlisle Cater, PA-C 10/21/20 1529    Hayden Rasmussen, MD 10/22/20 1024

## 2020-11-04 ENCOUNTER — Ambulatory Visit: Payer: 59

## 2020-11-05 ENCOUNTER — Other Ambulatory Visit: Payer: 59

## 2020-11-08 ENCOUNTER — Ambulatory Visit: Payer: 59 | Admitting: Orthopedic Surgery

## 2020-11-15 ENCOUNTER — Ambulatory Visit: Payer: 59 | Admitting: Orthopedic Surgery

## 2020-11-24 ENCOUNTER — Ambulatory Visit (INDEPENDENT_AMBULATORY_CARE_PROVIDER_SITE_OTHER): Payer: 59 | Admitting: Physician Assistant

## 2020-11-24 ENCOUNTER — Encounter: Payer: Self-pay | Admitting: Physician Assistant

## 2020-11-24 DIAGNOSIS — M86272 Subacute osteomyelitis, left ankle and foot: Secondary | ICD-10-CM

## 2020-11-24 NOTE — Progress Notes (Signed)
Office Visit Note   Patient: Katrina Wolfe           Date of Birth: 10-01-1966           MRN: [id number] Visit Date: 11/24/2020              Requested by: Wenda Low, MD [address] [place],  [place] [address] PCP: Wenda Low, MD  No chief complaint on file.     HPI: Patient is a pleasant 54 year old woman who comes in for periodic checks of her foot.  She has a history of left Charcot arthropathy.  She wears a Higher education careers adviser.  She does not have any specific complaints.  Reports a Band-Aid over the area with some Bactroban and has not noticed any increased drainage  Assessment & Plan: Visit Diagnoses: No diagnosis found.  Plan: Continue with boot.  Follow-up in 4 to 6 weeks or sooner if she notices any changes  Follow-Up Instructions: No follow-ups on file.   Ortho Exam  Patient is alert, oriented, no adenopathy, well-dressed, normal affect, normal respiratory effort. Examination pulses are palpable.  The lateral ulcer is callused.  After obtaining verbal consent this was debrided to a healthy surface.  There is no surrounding cellulitis there is a small slit opening that is approximately half a centimeter there is no foul odor no purulent drainage no signs of acute infection  Imaging: No results found. No images are attached to the encounter.  Labs: Lab Results  Component Value Date   REPTSTATUS 04/08/2019 FINAL 04/04/2019   GRAMSTAIN  04/04/2019    FEW WBC PRESENT,BOTH PMN AND MONONUCLEAR NO ORGANISMS SEEN    CULT  04/04/2019    FEW PSEUDOMONAS AERUGINOSA FEW SERRATIA MARCESCENS FEW STAPHYLOCOCCUS AUREUS CRITICAL RESULT CALLED TO, READ BACK BY AND VERIFIED WITH: RN Suzan Garibaldi [id number] FCP NO ANAEROBES ISOLATED Performed at [facility], [place] [address] [place], [place] [address]    LABORGA PSEUDOMONAS AERUGINOSA 04/04/2019   LABORGA SERRATIA MARCESCENS 04/04/2019   LABORGA STAPHYLOCOCCUS AUREUS 04/04/2019     Lab Results   Component Value Date   ALBUMIN 3.9 03/01/2019   ALBUMIN 3.5 (L) 05/24/2017   ALBUMIN 4.2 11/23/2016    No results found for: MG No results found for: VD25OH  No results found for: PREALBUMIN CBC EXTENDED Latest Ref Rng & Units 07/06/2020 03/01/2020 11/17/2019  WBC 3.8 - 10.8 Thousand/uL 10.8 10.7 9.6  RBC 3.80 - 5.10 Million/uL 4.32 4.50 4.19  HGB 11.7 - 15.5 g/dL 13.5 12.6 12.0  HCT 35 - 45 % 39.7 38.3 36.6  PLT 140 - 400 Thousand/uL 370 351 446(H)  NEUTROABS 1,500 - 7,800 cells/uL 7,538 7,180 5,789  LYMPHSABS 850 - 3,900 cells/uL 2,473 2,482 2,736     There is no height or weight on file to calculate BMI.  Orders:  No orders of the defined types were placed in this encounter.  No orders of the defined types were placed in this encounter.    Procedures: No procedures performed  Clinical Data: No additional findings.  ROS:  All other systems negative, except as noted in the HPI. Review of Systems  Objective: Vital Signs: There were no vitals taken for this visit.  Specialty Comments:  No specialty comments available.  PMFS History: Patient Active Problem List   Diagnosis Date Noted  . Osteomyelitis of foot ([place]) 04/04/2019  . Hardware complicating wound infection ([place])   . Subacute osteomyelitis, left ankle and foot ([place])   .  Open displaced fracture of fifth metatarsal bone of left foot   . Healthcare maintenance 04/03/2019  . HIV disease ([place]) 10/27/2015  . DM type 2 (diabetes mellitus, type 2) ([place]) 10/27/2015  . Hyperlipidemia 10/27/2015  . Essential hypertension 10/27/2015  . Severe vulvar dysplasia, histologically confirmed 10/15/2015   Past Medical History:  Diagnosis Date  . Abnormal uterine bleeding (AUB)   . Arthritis    knees, fingers  . Asthma    as child, no problems in 20 yrs  . Cancer ([place])   . CKD (chronic kidney disease), stage II   . Congenital cardiomegaly    per pt has always been told since childhood and siblings   . GERD  (gastroesophageal reflux disease)   . History of genital warts   . HIV (human immunodeficiency virus infection) ([place])    [place]-- DR Carlyle Basques  . Hypertension   . Intermittent palpitations    mild-- no meds  . Legally blind in left eye, as defined in [place]    trauma as child  . Tuberculosis    ? 2005 or 2007  . Type 2 diabetes mellitus (HCC)    Type II   . Uterine fibroid   . VIN III (vulvar intraepithelial neoplasia III)    and Verrucoid lesion on the mons  . Wears glasses     Family History  Problem Relation Age of Onset  . Hypertension Mother   . Diabetes Father   . Cancer Brother   . Breast cancer Cousin     Past Surgical History:  Procedure Laterality Date  . CESAREAN SECTION  2001  &  1984  . CO2 LASER APPLICATION N/A 31/04/4007   Procedure: CO2 LASER APPLICATION AND WIDE VOCAL EXCSION OF LESION ON MONS PUBIS;  Surgeon: Marti Sleigh, MD;  Location: [facility];  Service: Gynecology;  Laterality: N/A;   CASE CANCELLED   . CO2 LASER APPLICATION N/A 6/76/1950   Procedure: CO2 LASER OF VULVAR;  Surgeon: Marti Sleigh, MD;  Location: [facility];  Service: Gynecology;  Laterality: N/A;  . CORNEAL TRANSPLANT Left 2006   failed  . D & C HYSTEROSCOPY /  RESECTION FIBROID  2004  . DILATATION & CURETTAGE/HYSTEROSCOPY WITH MYOSURE N/A 10/01/2019   Procedure: DILATATION & CURETTAGE/HYSTEROSCOPY WITH MYOSURE and HYDROTHERMAL ABLATION;  Surgeon: Thurnell Lose, MD;  Location: [facility];  Service: Gynecology;  Laterality: N/A;  HTA rep will be here. Confirmed on 09/25/19 CS  . HARDWARE REMOVAL Left 04/04/2019   Procedure: EXCISION BONE AND REMOVE DEEP HARDWARE LEFT 5TH METATARSAL;  Surgeon: Newt Minion, MD;  Location: [facility] [place];  Service: Orthopedics;  Laterality: Left;  . KNEE ARTHROSCOPY W/ ACL RECONSTRUCTION Bilateral 2008  . ORIF TOE FRACTURE Left 08/02/2017   Procedure: OPEN  REDUCTION INTERNAL FIXATION (ORIF) FIFTH METATARSAL (TOE) BASE FRACTURE;  Surgeon: Wylene Simmer, MD;  Location: [facility];  Service: Orthopedics;  Laterality: Left;  Marland Kitchen VULVECTOMY N/A 01/07/2016   Procedure: WIDE LOCAL EXCISION;  Surgeon: Marti Sleigh, MD;  Location: [facility];  Service: Gynecology;  Laterality: N/A;  mons pubis as site   Social History   Occupational History  . Not on file  Tobacco Use  . Smoking status: Never Smoker  . Smokeless tobacco: Never Used  Vaping Use  . Vaping Use: Never used  Substance and Sexual Activity  . Alcohol use: No    Alcohol/week: 0.0 standard drinks  .  Drug use: No  . Sexual activity: Yes    Partners: Male    Birth control/protection: None, Condom    Comment: given condoms

## 2020-11-25 ENCOUNTER — Other Ambulatory Visit: Payer: Self-pay

## 2020-11-25 ENCOUNTER — Ambulatory Visit: Payer: 59

## 2020-11-25 NOTE — Progress Notes (Unsigned)
Mental Health Therapist Progress Note   Name: Ineze Gubler  Total time: 30  Type of Service: Individual Outpatient Mental Health Therapy  OBJECTIVE:  Mood: Euthymic and Affect: Appropriate Risk of harm to self or others: No plan to harm self or others  DIAGNOSIS:   GOALS ADDRESSED:  Patient will: 1.  Reduce symptoms of: stress  2.  Increase knowledge and/or ability of: coping skills  3.  Demonstrate ability to: {IBH Goals:21014053}  INTERVENTIONS: Interventions utilized:  Freight forwarder met with patient for outpatient mental health individual therapy to include ongoing assessment, support, and reinforcement.  Therapist allowed patient to "check in" since previous session; asking patient to share any positive coping skills they may have used over the previous week, along with any challenges faced.  Therapist provided supportive listening as patient processed their thoughts, emotional responses, and behaviors surrounding several stressors.  EFFECTIVENESS/PLAN: Client was alert, oriented x3, with no SI, HI, or symptoms of psychosis (risk low).  Client was pleasant and friendly, engaging openly and appropriately with therapist, benefiting from supportive listening and exploration of feelings.  Next session recommended in one to two weeks.   Hughes Better, LCSW

## 2020-12-23 ENCOUNTER — Ambulatory Visit: Payer: 59

## 2021-01-04 ENCOUNTER — Ambulatory Visit: Payer: 59

## 2021-01-05 ENCOUNTER — Ambulatory Visit: Payer: 59 | Admitting: Physician Assistant

## 2021-01-10 ENCOUNTER — Ambulatory Visit (INDEPENDENT_AMBULATORY_CARE_PROVIDER_SITE_OTHER): Payer: 59 | Admitting: Physician Assistant

## 2021-01-10 ENCOUNTER — Encounter: Payer: Self-pay | Admitting: Orthopedic Surgery

## 2021-01-10 VITALS — Ht 69.5 in | Wt 264.0 lb

## 2021-01-10 DIAGNOSIS — E11610 Type 2 diabetes mellitus with diabetic neuropathic arthropathy: Secondary | ICD-10-CM

## 2021-01-10 NOTE — Progress Notes (Signed)
Office Visit Note   Patient: Katrina Wolfe           Date of Birth: 26-Sep-1966           MRN: [id number] Visit Date: 01/10/2021              Requested by: Wenda Low, MD [address] [place] [address],  [place] [address] PCP: Wenda Low, MD  Chief Complaint  Patient presents with  . Left Ankle - Follow-up  . Left Foot - Follow-up      HPI: Patient is a pleasant woman with a history of left ankle foot. Varus deformity. She wears a Higher education careers adviser and works quite well for her. She comes in occasionally for trimming of the callus that develops on the lateral side of her foot she says she had a little pain this morning. She thinks may be from the change in weather  Assessment & Plan: Visit Diagnoses: No diagnosis found.  Plan: Follow-up in 6 to 8 weeks or sooner if any issues  Follow-Up Instructions: No follow-ups on file.   Ortho Exam  Patient is alert, oriented, no adenopathy, well-dressed, normal affect, normal respiratory effort. Left foot fixed varus deformity. She has a very thin callus on the lateral side without any surrounding erythema. This was trimmed after verbal consent to a softer surface. She only has a spot of drainage on the Band-Aid that she uses no evidence of any infection  Imaging: No results found. No images are attached to the encounter.  Labs: Lab Results  Component Value Date   REPTSTATUS 04/08/2019 FINAL 04/04/2019   GRAMSTAIN  04/04/2019    FEW WBC PRESENT,BOTH PMN AND MONONUCLEAR NO ORGANISMS SEEN    CULT  04/04/2019    FEW PSEUDOMONAS AERUGINOSA FEW SERRATIA MARCESCENS FEW STAPHYLOCOCCUS AUREUS CRITICAL RESULT CALLED TO, READ BACK BY AND VERIFIED WITH: RN Suzan Garibaldi [phone] FCP NO ANAEROBES ISOLATED Performed at [facility], [place] [address] [place], [place] [address]    LABORGA PSEUDOMONAS AERUGINOSA 04/04/2019   LABORGA SERRATIA MARCESCENS 04/04/2019   LABORGA STAPHYLOCOCCUS AUREUS 04/04/2019     Lab Results   Component Value Date   ALBUMIN 3.9 03/01/2019   ALBUMIN 3.5 (L) 05/24/2017   ALBUMIN 4.2 11/23/2016    No results found for: MG No results found for: VD25OH  No results found for: PREALBUMIN CBC EXTENDED Latest Ref Rng & Units 07/06/2020 03/01/2020 11/17/2019  WBC 3.8 - 10.8 Thousand/uL 10.8 10.7 9.6  RBC 3.80 - 5.10 Million/uL 4.32 4.50 4.19  HGB 11.7 - 15.5 g/dL 13.5 12.6 12.0  HCT 35.0 - 45.0 % 39.7 38.3 36.6  PLT 140 - 400 Thousand/uL 370 351 446(H)  NEUTROABS 1,500 - 7,800 cells/uL 7,538 7,180 5,789  LYMPHSABS 850 - 3,900 cells/uL 2,473 2,482 2,736     Body mass index is 38.43 kg/m.  Orders:  No orders of the defined types were placed in this encounter.  No orders of the defined types were placed in this encounter.    Procedures: No procedures performed  Clinical Data: No additional findings.  ROS:  All other systems negative, except as noted in the HPI. Review of Systems  Objective: Vital Signs: Ht 5' 9.5" (1.765 m)   Wt 264 lb (119.7 kg)   BMI 38.43 kg/m   Specialty Comments:  No specialty comments available.  PMFS History: Patient Active Problem List   Diagnosis Date Noted  . Osteomyelitis of foot ([place]) 04/04/2019  . Hardware complicating wound infection (  [place])   . Subacute osteomyelitis, left ankle and foot ([place])   . Open displaced fracture of fifth metatarsal bone of left foot   . Healthcare maintenance 04/03/2019  . HIV disease ([place]) 10/27/2015  . DM type 2 (diabetes mellitus, type 2) ([place]) 10/27/2015  . Hyperlipidemia 10/27/2015  . Essential hypertension 10/27/2015  . Severe vulvar dysplasia, histologically confirmed 10/15/2015   Past Medical History:  Diagnosis Date  . Abnormal uterine bleeding (AUB)   . Arthritis    knees, fingers  . Asthma    as child, no problems in 20 yrs  . Cancer ([place])   . CKD (chronic kidney disease), stage II   . Congenital cardiomegaly    per pt has always been told since childhood and siblings   . GERD  (gastroesophageal reflux disease)   . History of genital warts   . HIV (human immunodeficiency virus infection) ([place])    [place]-- DR Carlyle Basques  . Hypertension   . Intermittent palpitations    mild-- no meds  . Legally blind in left eye, as defined in [place]    trauma as child  . Tuberculosis    ? 2005 or 2007  . Type 2 diabetes mellitus (HCC)    Type II   . Uterine fibroid   . VIN III (vulvar intraepithelial neoplasia III)    and Verrucoid lesion on the mons  . Wears glasses     Family History  Problem Relation Age of Onset  . Hypertension Mother   . Diabetes Father   . Cancer Brother   . Breast cancer Cousin     Past Surgical History:  Procedure Laterality Date  . CESAREAN SECTION  2001  &  1984  . CO2 LASER APPLICATION N/A 50/08/3266   Procedure: CO2 LASER APPLICATION AND WIDE VOCAL EXCSION OF LESION ON MONS PUBIS;  Surgeon: Marti Sleigh, MD;  Location: [facility];  Service: Gynecology;  Laterality: N/A;   CASE CANCELLED   . CO2 LASER APPLICATION N/A 01/11/5808   Procedure: CO2 LASER OF VULVAR;  Surgeon: Marti Sleigh, MD;  [facility];  Service: Gynecology;  Laterality: N/A;  . CORNEAL TRANSPLANT Left 2006   failed  . D & C HYSTEROSCOPY /  RESECTION FIBROID  2004  . DILATATION & CURETTAGE/HYSTEROSCOPY WITH MYOSURE N/A 10/01/2019   Procedure: DILATATION & CURETTAGE/HYSTEROSCOPY WITH MYOSURE and HYDROTHERMAL ABLATION;  Surgeon: Thurnell Lose, MD;  Location: [facility];  Service: Gynecology;  Laterality: N/A;  HTA rep will be here. Confirmed on 09/25/19 CS  . HARDWARE REMOVAL Left 04/04/2019   Procedure: EXCISION BONE AND REMOVE DEEP HARDWARE LEFT 5TH METATARSAL;  Surgeon: Newt Minion, MD;  Location: [place];  Service: Orthopedics;  Laterality: Left;  . KNEE ARTHROSCOPY W/ ACL RECONSTRUCTION Bilateral 2008  . ORIF TOE FRACTURE Left 08/02/2017   Procedure: OPEN  REDUCTION INTERNAL FIXATION (ORIF) FIFTH METATARSAL (TOE) BASE FRACTURE;  Surgeon: Wylene Simmer, MD;  Location: [facility];  Service: Orthopedics;  Laterality: Left;  Marland Kitchen VULVECTOMY N/A 01/07/2016   Procedure: WIDE LOCAL EXCISION;  Surgeon: Marti Sleigh, MD;  [facility];  Service: Gynecology;  Laterality: N/A;  mons pubis as site   Social History   Occupational History  . Not on file  Tobacco Use  . Smoking status: Never Smoker  . Smokeless tobacco: Never Used  Vaping Use  . Vaping Use: Never used  Substance and Sexual Activity  .  Alcohol use: No    Alcohol/week: 0.0 standard drinks  . Drug use: No  . Sexual activity: Yes    Partners: Male    Birth control/protection: None, Condom    Comment: given condoms

## 2021-01-11 ENCOUNTER — Other Ambulatory Visit: Payer: Self-pay

## 2021-01-11 ENCOUNTER — Ambulatory Visit: Payer: 59

## 2021-01-17 ENCOUNTER — Emergency Department (HOSPITAL_COMMUNITY): Payer: 59

## 2021-01-17 ENCOUNTER — Encounter (HOSPITAL_COMMUNITY): Payer: Self-pay

## 2021-01-17 ENCOUNTER — Other Ambulatory Visit: Payer: Self-pay

## 2021-01-17 DIAGNOSIS — M79652 Pain in left thigh: Secondary | ICD-10-CM | POA: Diagnosis not present

## 2021-01-17 DIAGNOSIS — R059 Cough, unspecified: Secondary | ICD-10-CM | POA: Insufficient documentation

## 2021-01-17 DIAGNOSIS — M79651 Pain in right thigh: Secondary | ICD-10-CM | POA: Diagnosis not present

## 2021-01-17 DIAGNOSIS — Z5321 Procedure and treatment not carried out due to patient leaving prior to being seen by health care provider: Secondary | ICD-10-CM | POA: Diagnosis not present

## 2021-01-17 IMAGING — CR DG CHEST 2V
2 series · 2 of 2 positions shown · non-contrast
Comparison: None.

CLINICAL DATA: Cough and pain on inspiration

EXAM:
CHEST - 2 VIEW

[w chest lat]
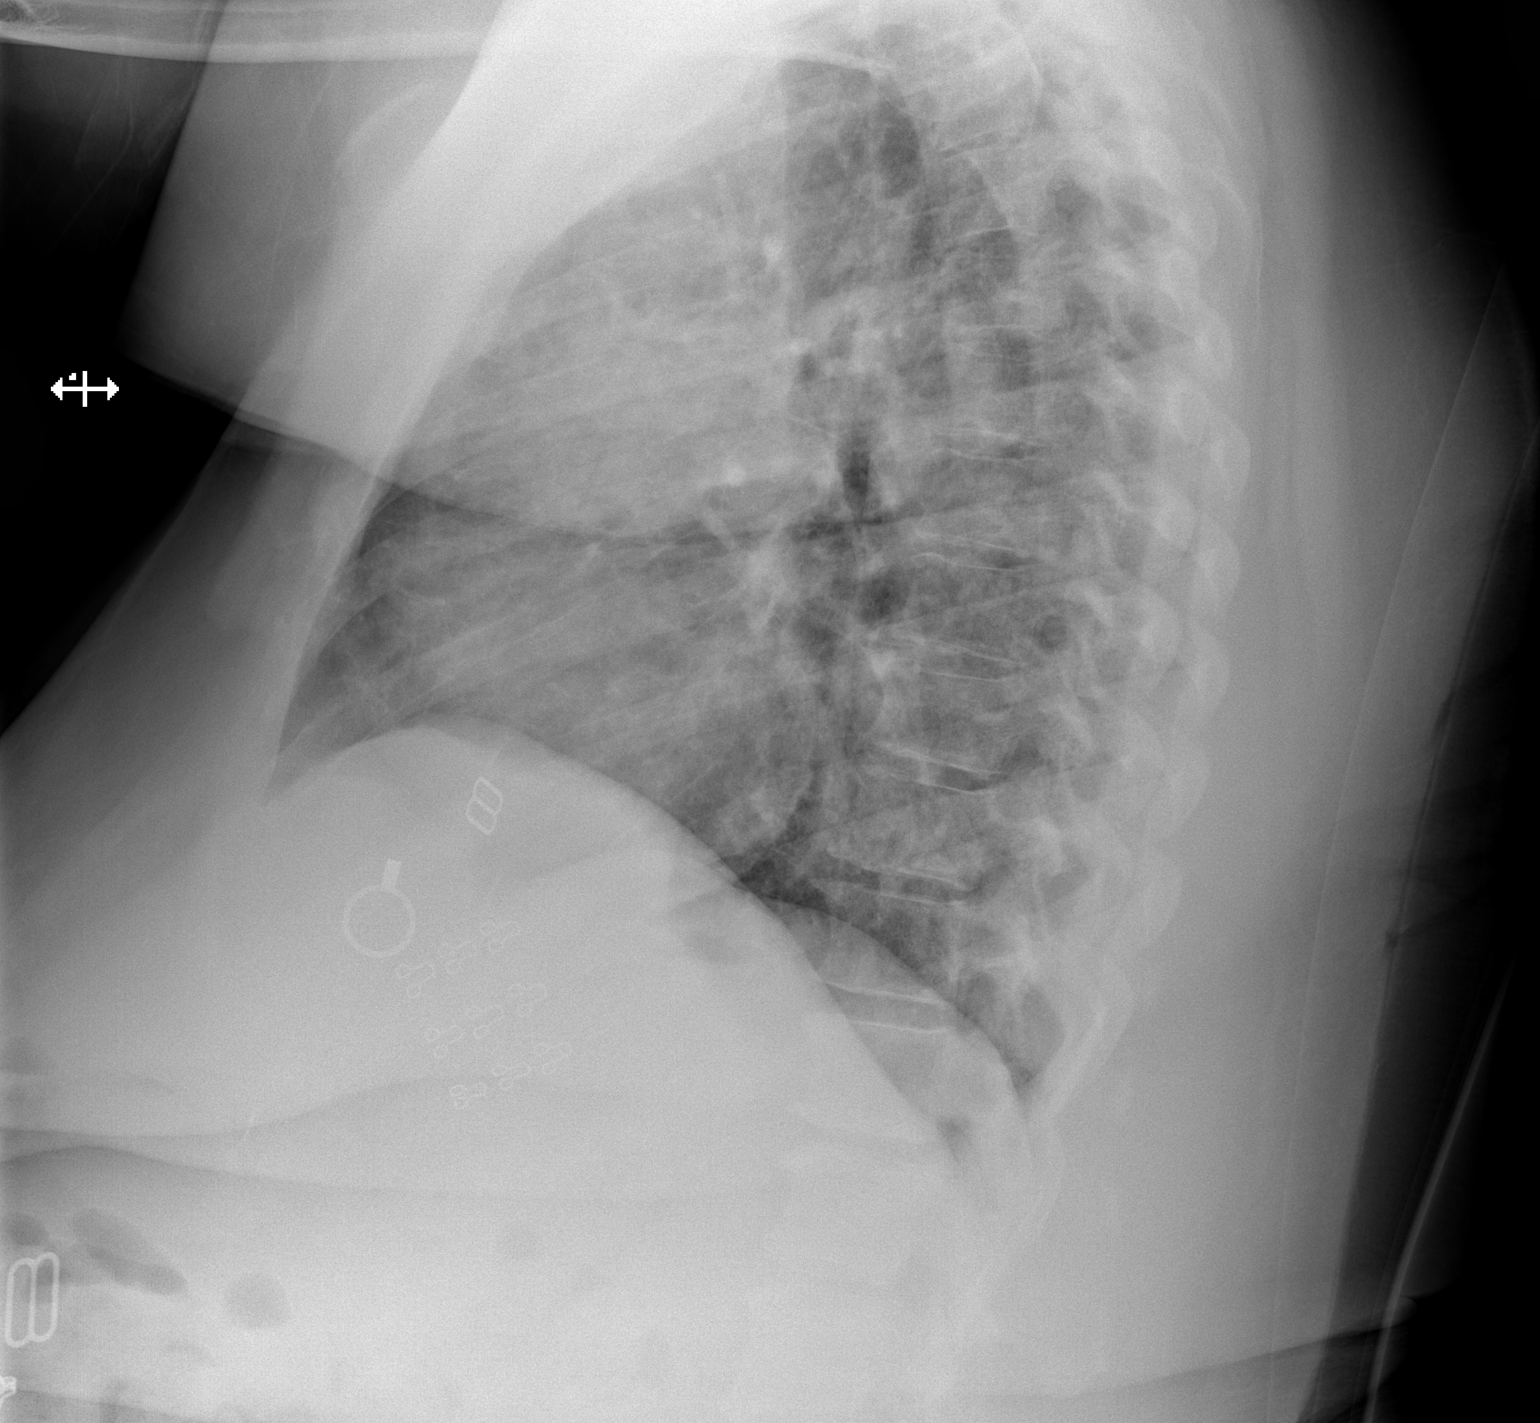

[x chest ap]
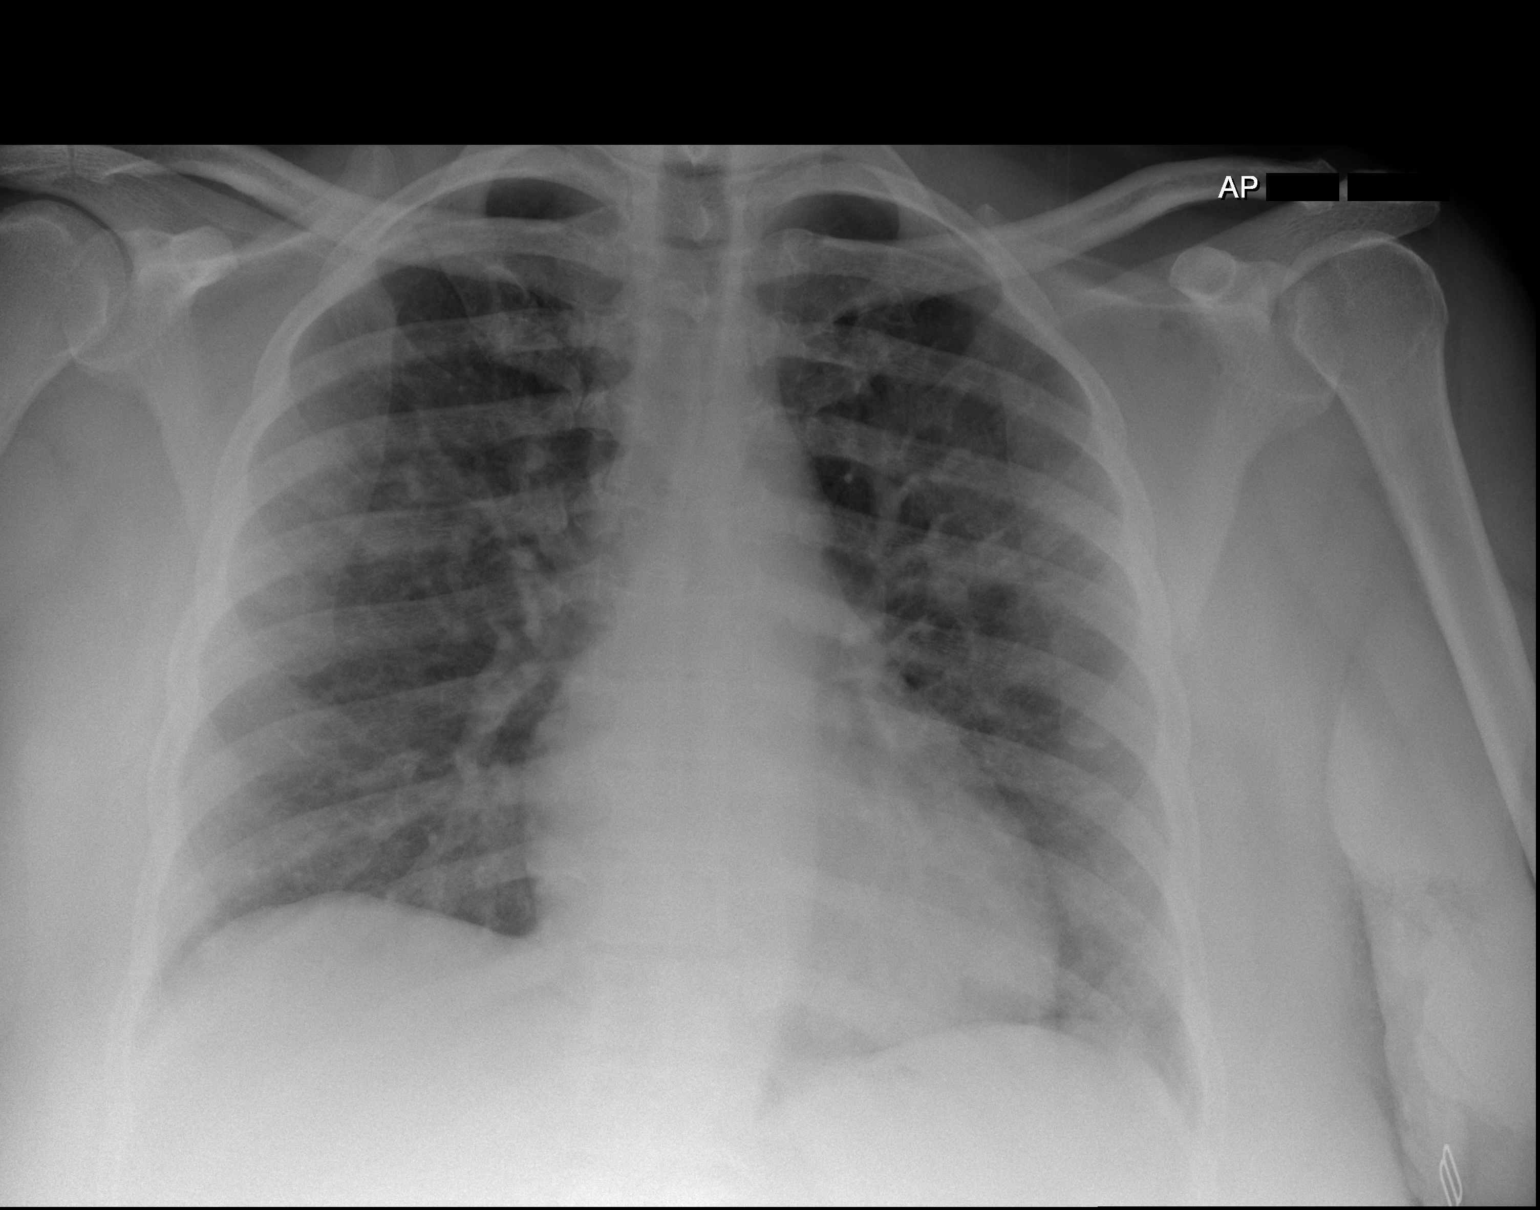

[2 of 2 positions shown; findings below may reference images not displayed]

FINDINGS: Cardiac shadow is within normal limits. Lungs are well aerated
bilaterally. Very mild patchy opacities are noted within the left
lung suspicious for multifocal pneumonia. Correlate with [SA]
testing. No sizable effusion is seen. No bony abnormality is noted.
IMPRESSION: Patchy airspace opacities as described. Correlate with [SA]
testing.

## 2021-01-17 NOTE — ED Triage Notes (Signed)
Pt reports pain on inspiration for approx 1 week and a cough for 3 days. Pt reports bilateral thigh pain for 3 days also.

## 2021-01-18 ENCOUNTER — Emergency Department (HOSPITAL_COMMUNITY)
Admission: EM | Admit: 2021-01-18 | Discharge: 2021-01-18 | Disposition: A | Discharge status: Home / Self Care | Payer: 59 | Attending: Emergency Medicine | Admitting: Emergency Medicine

## 2021-01-31 ENCOUNTER — Other Ambulatory Visit: Payer: Self-pay | Admitting: Ophthalmology

## 2021-02-04 ENCOUNTER — Ambulatory Visit
Admission: RE | Admit: 2021-02-04 | Discharge: 2021-02-04 | Disposition: A | Discharge status: Home / Self Care | Payer: 59 | Source: Ambulatory Visit | Attending: Internal Medicine | Admitting: Internal Medicine

## 2021-02-04 ENCOUNTER — Other Ambulatory Visit: Payer: Self-pay | Admitting: Internal Medicine

## 2021-02-04 DIAGNOSIS — J189 Pneumonia, unspecified organism: Secondary | ICD-10-CM

## 2021-02-04 IMAGING — DX DG CHEST 2V
2 series · 2 of 2 positions shown · non-contrast
Comparison: [DATE]

CLINICAL DATA: LEFT lung pneumonia, follow-up

EXAM:
CHEST - 2 VIEW

[dg chest 2 view (1 of 2)]
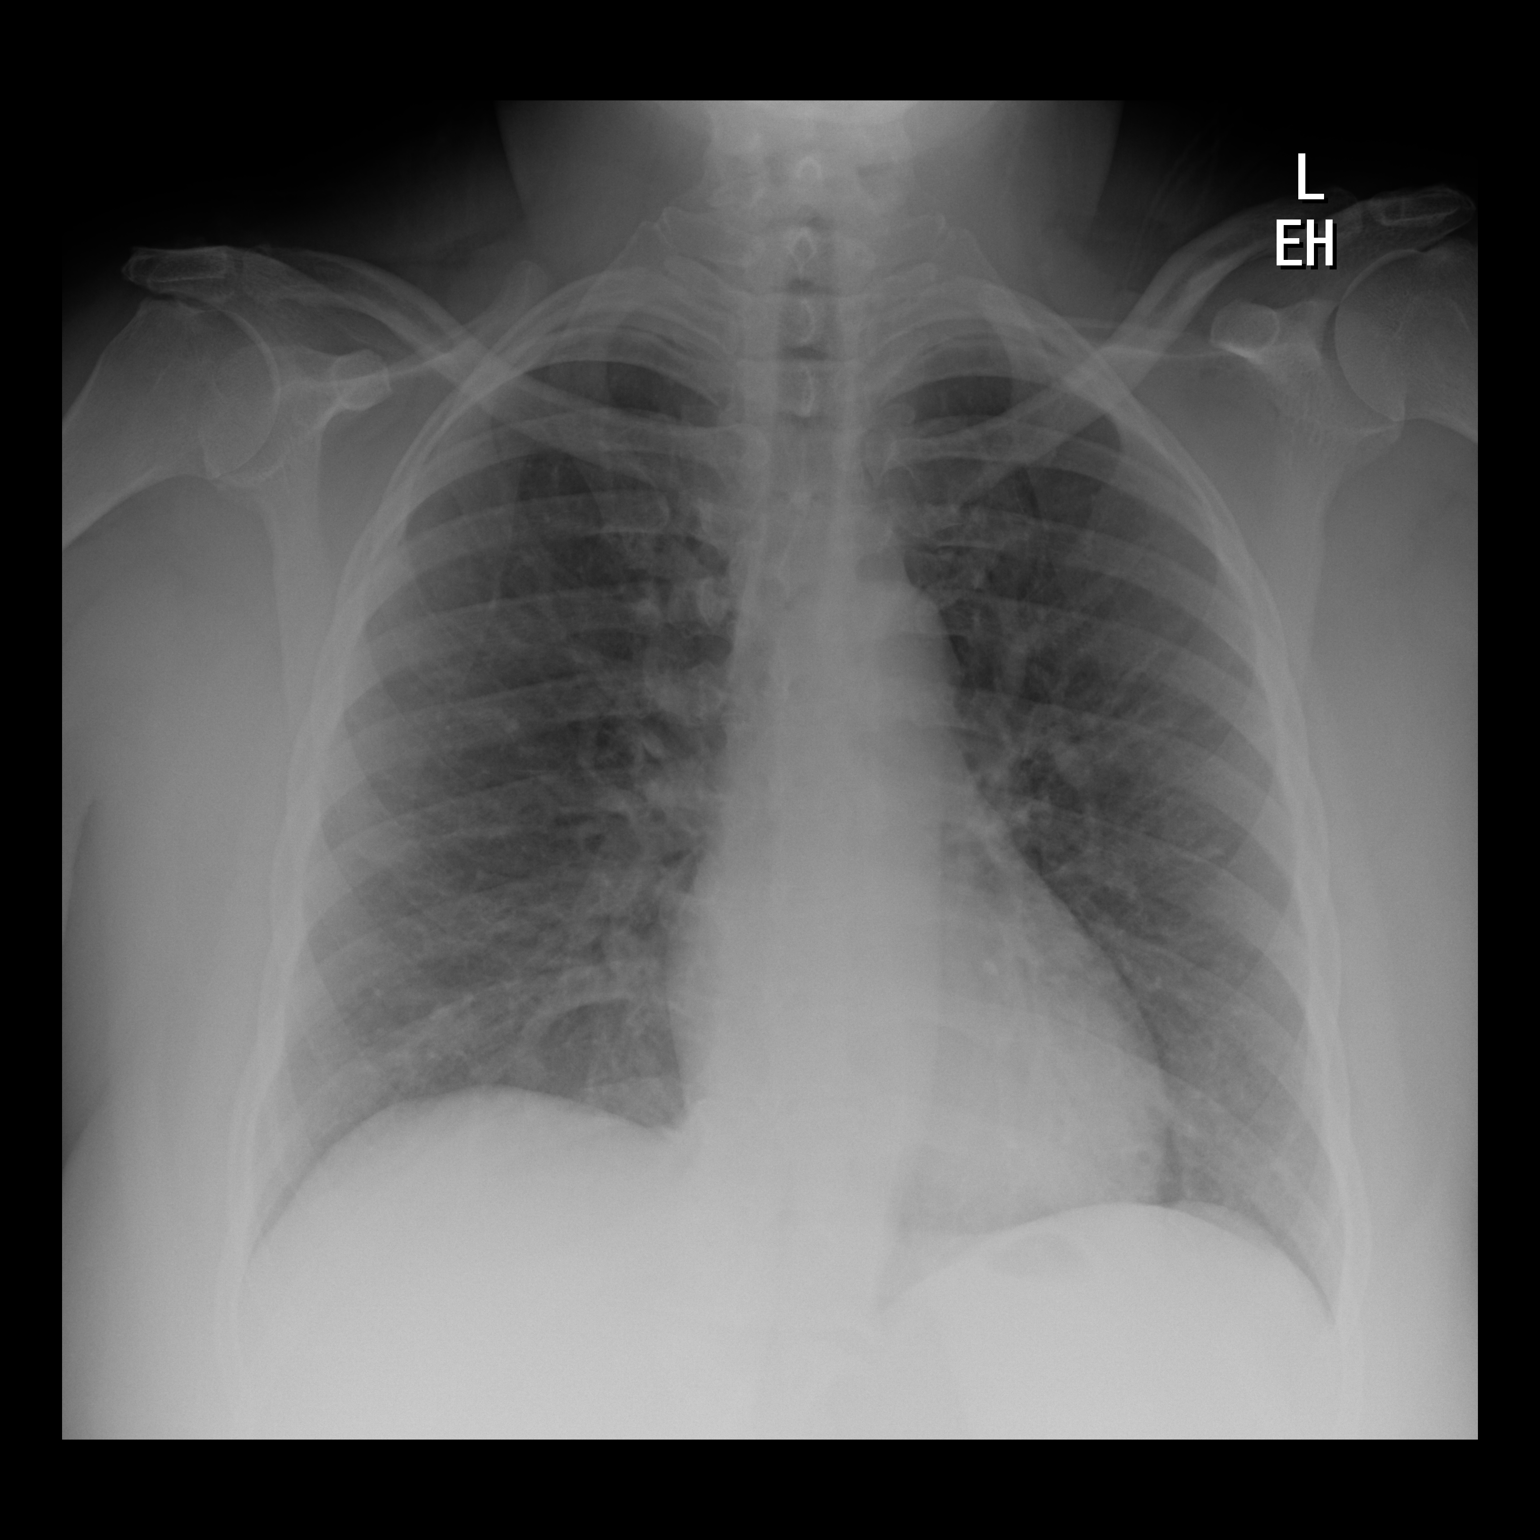

[dg chest 2 view (2 of 2)]
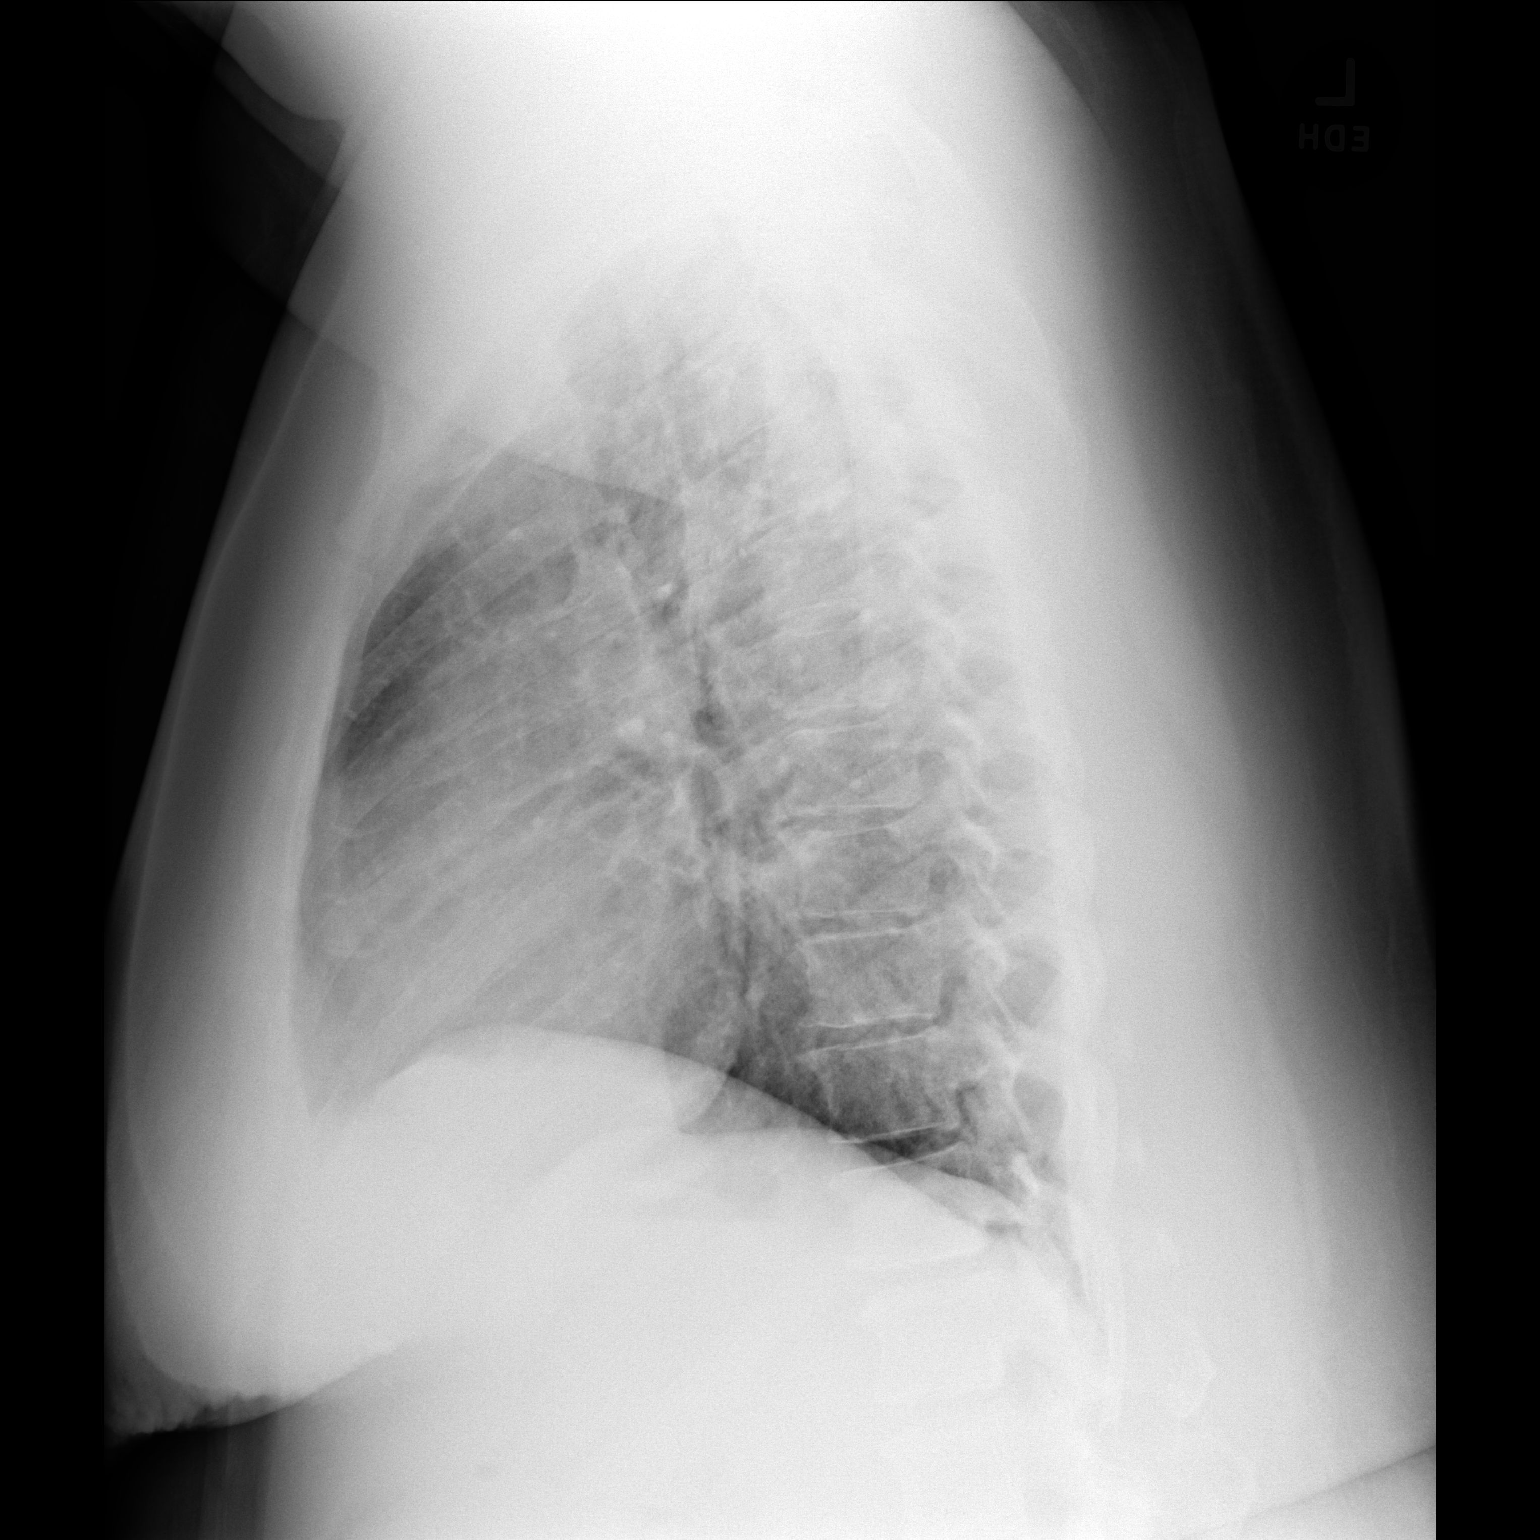

[2 of 2 positions shown; findings below may reference images not displayed]

FINDINGS: Normal heart size, mediastinal contours, and pulmonary vascularity.

Lungs now clear.

No pulmonary infiltrate, pleural effusion, or pneumothorax.

Osseous structures unremarkable.
IMPRESSION: Normal exam.

## 2021-02-08 ENCOUNTER — Other Ambulatory Visit: Payer: Self-pay | Admitting: Ophthalmology

## 2021-02-08 DIAGNOSIS — H04033 Chronic enlargement of bilateral lacrimal glands: Secondary | ICD-10-CM

## 2021-02-17 ENCOUNTER — Other Ambulatory Visit: Payer: Self-pay

## 2021-02-17 DIAGNOSIS — B20 Human immunodeficiency virus [HIV] disease: Secondary | ICD-10-CM

## 2021-02-17 DIAGNOSIS — Z113 Encounter for screening for infections with a predominantly sexual mode of transmission: Secondary | ICD-10-CM

## 2021-02-17 DIAGNOSIS — Z79899 Other long term (current) drug therapy: Secondary | ICD-10-CM

## 2021-02-21 ENCOUNTER — Ambulatory Visit (INDEPENDENT_AMBULATORY_CARE_PROVIDER_SITE_OTHER): Payer: 59 | Admitting: Physician Assistant

## 2021-02-21 ENCOUNTER — Encounter: Payer: Self-pay | Admitting: Orthopedic Surgery

## 2021-02-21 DIAGNOSIS — E11610 Type 2 diabetes mellitus with diabetic neuropathic arthropathy: Secondary | ICD-10-CM | POA: Diagnosis not present

## 2021-02-21 NOTE — Progress Notes (Signed)
Office Visit Note   Patient: Katrina Wolfe           Date of Birth: June 03, 1966           MRN: [id number] Visit Date: 02/21/2021              Requested by: Wenda Low, MD [address] [place],  [place] [address] PCP: Wenda Low, MD  Chief Complaint  Patient presents with  . Left Foot - Pain  . Left Ankle - Pain      HPI: Patient is a pleasant 55 year old woman with a history of a left ankle foot varus deformity.  She uses a Higher education careers adviser.  Periodically she comes in for trimming of the callus on the lateral side of her foot.  She did say she had some burning in the foot over the weekend but it quickly went away.  Assessment & Plan: Visit Diagnoses: No diagnosis found.  Plan: Patient will follow up in a month.  We talked about options in the future she may consider if anything can be done surgically in the future but right now she is anticipating surgery on her eye.  We'll continue with her boot  Follow-Up Instructions: No follow-ups on file.   Ortho Exam  Patient is alert, oriented, no adenopathy, well-dressed, normal affect, normal respiratory effort. Examination of her left foot she has a palpable dorsalis pedis pulse obvious varus deformity.  Can correct her ankle to a neutral position.  She has a thickened callus on the lateral side.  This after verbal consent was debrided to soft surfaces.  She has a small ulcer in the central portion but there is no erythema does not probe deeply cannot elicit any drainage.  No ascending cellulitis or signs of infection  Imaging: No results found. No images are attached to the encounter.  Labs: Lab Results  Component Value Date   REPTSTATUS 04/08/2019 FINAL 04/04/2019   GRAMSTAIN  04/04/2019    FEW WBC PRESENT,BOTH PMN AND MONONUCLEAR NO ORGANISMS SEEN    CULT  04/04/2019    FEW PSEUDOMONAS AERUGINOSA FEW SERRATIA MARCESCENS FEW STAPHYLOCOCCUS AUREUS CRITICAL RESULT CALLED TO, READ BACK BY AND VERIFIED WITH:  RN Suzan Garibaldi [phone] FCP NO ANAEROBES ISOLATED Performed at [facility], [place] [address], [place], [place] [address]    LABORGA PSEUDOMONAS AERUGINOSA 04/04/2019   LABORGA SERRATIA MARCESCENS 04/04/2019   LABORGA STAPHYLOCOCCUS AUREUS 04/04/2019     Lab Results  Component Value Date   ALBUMIN 3.9 03/01/2019   ALBUMIN 3.5 (L) 05/24/2017   ALBUMIN 4.2 11/23/2016    No results found for: MG No results found for: VD25OH  No results found for: PREALBUMIN CBC EXTENDED Latest Ref Rng & Units 07/06/2020 03/01/2020 11/17/2019  WBC 3.8 - 10.8 Thousand/uL 10.8 10.7 9.6  RBC 3.80 - 5.10 Million/uL 4.32 4.50 4.19  HGB 11.7 - 15.5 g/dL 13.5 12.6 12.0  HCT 35.0 - 45.0 % 39.7 38.3 36.6  PLT 140 - 400 Thousand/uL 370 351 446(H)  NEUTROABS 1,500 - 7,800 cells/uL 7,538 7,180 5,789  LYMPHSABS 850 - 3,900 cells/uL 2,473 2,482 2,736     There is no height or weight on file to calculate BMI.  Orders:  No orders of the defined types were placed in this encounter.  No orders of the defined types were placed in this encounter.    Procedures: No procedures performed  Clinical Data: No additional findings.  ROS:  All other systems negative, except as  noted in the HPI. Review of Systems  Objective: Vital Signs: There were no vitals taken for this visit.  Specialty Comments:  No specialty comments available.  PMFS History: Patient Active Problem List   Diagnosis Date Noted  . Osteomyelitis of foot ([place]) 04/04/2019  . Hardware complicating wound infection ([place])   . Subacute osteomyelitis, left ankle and foot ([place])   . Open displaced fracture of fifth metatarsal bone of left foot   . Healthcare maintenance 04/03/2019  . HIV disease ([place]) 10/27/2015  . DM type 2 (diabetes mellitus, type 2) ([place]) 10/27/2015  . Hyperlipidemia 10/27/2015  . Essential hypertension 10/27/2015  . Severe vulvar dysplasia, histologically confirmed 10/15/2015   Past Medical History:  Diagnosis  Date  . Abnormal uterine bleeding (AUB)   . Arthritis    knees, fingers  . Asthma    as child, no problems in 20 yrs  . Cancer ([place])   . CKD (chronic kidney disease), stage II   . Congenital cardiomegaly    per pt has always been told since childhood and siblings   . GERD (gastroesophageal reflux disease)   . History of genital warts   . HIV (human immunodeficiency virus infection) ([place])    [place]-- DR Carlyle Basques  . Hypertension   . Intermittent palpitations    mild-- no meds  . Legally blind in left eye, as defined in [place]    trauma as child  . Tuberculosis    ? 2005 or 2007  . Type 2 diabetes mellitus (HCC)    Type II   . Uterine fibroid   . VIN III (vulvar intraepithelial neoplasia III)    and Verrucoid lesion on the mons  . Wears glasses     Family History  Problem Relation Age of Onset  . Hypertension Mother   . Diabetes Father   . Cancer Brother   . Breast cancer Cousin     Past Surgical History:  Procedure Laterality Date  . CESAREAN SECTION  2001  &  1984  . CO2 LASER APPLICATION N/A 56/02/8755   Procedure: CO2 LASER APPLICATION AND WIDE VOCAL EXCSION OF LESION ON MONS PUBIS;  Surgeon: Marti Sleigh, MD;  Location: [facility];  Service: Gynecology;  Laterality: N/A;   CASE CANCELLED   . CO2 LASER APPLICATION N/A 4/33/2951   Procedure: CO2 LASER OF VULVAR;  Surgeon: Marti Sleigh, MD;  Location: [facility];  Service: Gynecology;  Laterality: N/A;  . CORNEAL TRANSPLANT Left 2006   failed  . D & C HYSTEROSCOPY /  RESECTION FIBROID  2004  . DILATATION & CURETTAGE/HYSTEROSCOPY WITH MYOSURE N/A 10/01/2019   Procedure: DILATATION & CURETTAGE/HYSTEROSCOPY WITH MYOSURE and HYDROTHERMAL ABLATION;  Surgeon: Thurnell Lose, MD;  Location: [facility];  Service: Gynecology;  Laterality: N/A;  HTA rep will be here. Confirmed on 09/25/19 CS  . HARDWARE REMOVAL Left 04/04/2019    Procedure: EXCISION BONE AND REMOVE DEEP HARDWARE LEFT 5TH METATARSAL;  Surgeon: Newt Minion, MD;  Location: [facility];  Service: Orthopedics;  Laterality: Left;  . KNEE ARTHROSCOPY W/ ACL RECONSTRUCTION Bilateral 2008  . ORIF TOE FRACTURE Left 08/02/2017   Procedure: OPEN REDUCTION INTERNAL FIXATION (ORIF) FIFTH METATARSAL (TOE) BASE FRACTURE;  Surgeon: Wylene Simmer, MD;  Location: [facility];  Service: Orthopedics;  Laterality: Left;  Marland Kitchen VULVECTOMY N/A 01/07/2016   Procedure: WIDE LOCAL EXCISION;  Surgeon: Marti Sleigh, MD;  Location: [facility];  Service: Gynecology;  Laterality: N/A;  mons pubis as site   Social History   Occupational History  . Not on file  Tobacco Use  . Smoking status: Never Smoker  . Smokeless tobacco: Never Used  Vaping Use  . Vaping Use: Never used  Substance and Sexual Activity  . Alcohol use: No    Alcohol/week: 0.0 standard drinks  . Drug use: No  . Sexual activity: Yes    Partners: Male    Birth control/protection: None, Condom    Comment: given condoms

## 2021-02-22 ENCOUNTER — Other Ambulatory Visit: Payer: Self-pay

## 2021-02-22 ENCOUNTER — Other Ambulatory Visit: Payer: 59

## 2021-02-22 ENCOUNTER — Ambulatory Visit: Payer: 59

## 2021-02-22 DIAGNOSIS — B20 Human immunodeficiency virus [HIV] disease: Secondary | ICD-10-CM

## 2021-02-22 DIAGNOSIS — Z79899 Other long term (current) drug therapy: Secondary | ICD-10-CM

## 2021-02-22 DIAGNOSIS — Z113 Encounter for screening for infections with a predominantly sexual mode of transmission: Secondary | ICD-10-CM

## 2021-02-23 LAB — T-HELPER CELL (CD4) - (RCID CLINIC ONLY)
CD4 % Helper T Cell: 28 % — ABNORMAL LOW (ref 33–65)
CD4 T Cell Abs: 654 /uL (ref 400–1790)

## 2021-02-24 ENCOUNTER — Ambulatory Visit
Admission: RE | Admit: 2021-02-24 | Discharge: 2021-02-24 | Disposition: A | Discharge status: Home / Self Care | Payer: 59 | Source: Ambulatory Visit | Attending: Ophthalmology | Admitting: Ophthalmology

## 2021-02-24 ENCOUNTER — Other Ambulatory Visit: Payer: Self-pay

## 2021-02-24 DIAGNOSIS — H04033 Chronic enlargement of bilateral lacrimal glands: Secondary | ICD-10-CM

## 2021-02-24 LAB — RPR: RPR Ser Ql: NONREACTIVE

## 2021-02-24 LAB — COMPLETE METABOLIC PANEL WITH GFR
AG Ratio: 1.2 (calc) (ref 1.0–2.5)
ALT: 23 U/L (ref 6–29)
AST: 16 U/L (ref 10–35)
Albumin: 3.7 g/dL (ref 3.6–5.1)
Alkaline phosphatase (APISO): 40 U/L (ref 37–153)
BUN/Creatinine Ratio: 11 (calc) (ref 6–22)
BUN: 16 mg/dL (ref 7–25)
CO2: 20 mmol/L (ref 20–32)
Calcium: 9.2 mg/dL (ref 8.6–10.4)
Chloride: 106 mmol/L (ref 98–110)
Creat: 1.42 mg/dL — ABNORMAL HIGH (ref 0.50–1.05)
GFR, Est African American: 48 mL/min/{1.73_m2} — ABNORMAL LOW (ref >=60)
GFR, Est Non African American: 42 mL/min/{1.73_m2} — ABNORMAL LOW (ref >=60)
Globulin: 3.2 g/dL (calc) (ref 1.9–3.7)
Glucose, Bld: 164 mg/dL — ABNORMAL HIGH (ref 65–99)
Potassium: 4.5 mmol/L (ref 3.5–5.3)
Sodium: 138 mmol/L (ref 135–146)
Total Bilirubin: 0.4 mg/dL (ref 0.2–1.2)
Total Protein: 6.9 g/dL (ref 6.1–8.1)

## 2021-02-24 LAB — CBC WITH DIFFERENTIAL/PLATELET
Absolute Monocytes: 613 cells/uL (ref 200–950)
Basophils Absolute: 38 cells/uL (ref 0–200)
Basophils Relative: 0.3 %
Eosinophils Absolute: 275 cells/uL (ref 15–500)
Eosinophils Relative: 2.2 %
HCT: 38.9 % (ref 35.0–45.0)
Hemoglobin: 13.5 g/dL (ref 11.7–15.5)
Lymphs Abs: 2488 cells/uL (ref 850–3900)
MCH: 32.8 pg (ref 27.0–33.0)
MCHC: 34.7 g/dL (ref 32.0–36.0)
MCV: 94.6 fL (ref 80.0–100.0)
MPV: 11.3 fL (ref 7.5–12.5)
Monocytes Relative: 4.9 %
Neutro Abs: 9088 cells/uL — ABNORMAL HIGH (ref 1500–7800)
Neutrophils Relative %: 72.7 %
Platelets: 330 10*3/uL (ref 140–400)
RBC: 4.11 10*6/uL (ref 3.80–5.10)
RDW: 13.3 % (ref 11.0–15.0)
Total Lymphocyte: 19.9 %
WBC: 12.5 10*3/uL — ABNORMAL HIGH (ref 3.8–10.8)

## 2021-02-24 LAB — LIPID PANEL
Cholesterol: 111 mg/dL (ref <200)
HDL: 34 mg/dL — ABNORMAL LOW (ref >=50)
LDL Cholesterol (Calc): 59 mg/dL (calc)
Non-HDL Cholesterol (Calc): 77 mg/dL (calc) (ref <130)
Total CHOL/HDL Ratio: 3.3 (calc) (ref <5.0)
Triglycerides: 95 mg/dL (ref <150)

## 2021-02-24 LAB — HIV-1 RNA QUANT-NO REFLEX-BLD
HIV 1 RNA Quant: NOT DETECTED Copies/mL
HIV-1 RNA Quant, Log: NOT DETECTED Log cps/mL

## 2021-02-24 IMAGING — CT CT ORBITS W/ CM
1 series · 16 of 30 positions shown, 20 images · IV contrast (iopamidol)
Comparison: None.

CLINICAL DATA: Enlargement of lacrimal glands

EXAM:
CT ORBITS WITH CONTRAST
TECHNIQUE: Multidetector CT images was performed according to the standard
protocol following intravenous contrast administration.
CONTRAST:  60mL [BY] IOPAMIDOL ([BY]) INJECTION 61%

[Series 4: orbits-soft · axial · 0.33mm/px · z∈[-143,-79]mm · 16 of 36 slices shown, 20 images]
[im 2/36  brain]
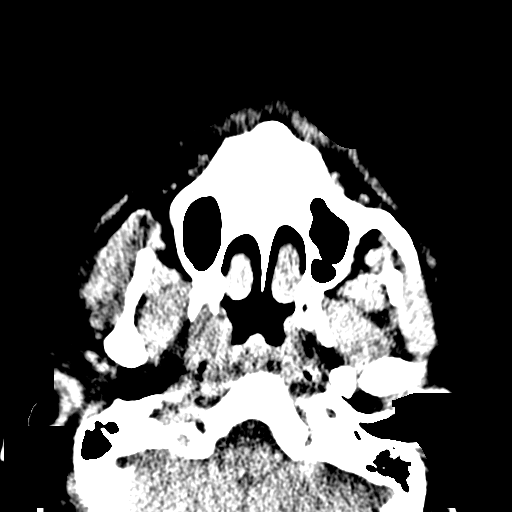
[im 2/36  bone]
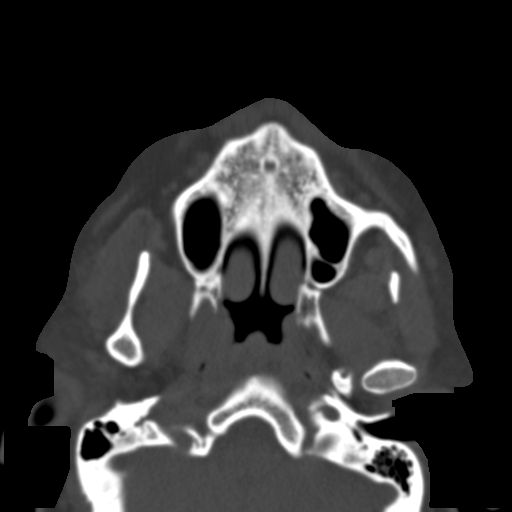
[im 4/36  bone]
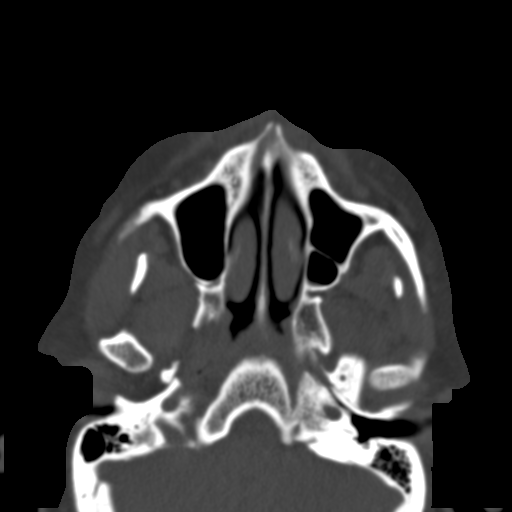
[im 7/36  bone]
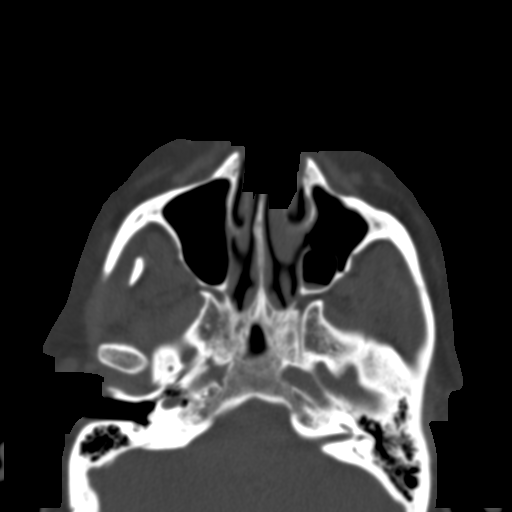
[im 9/36  bone]
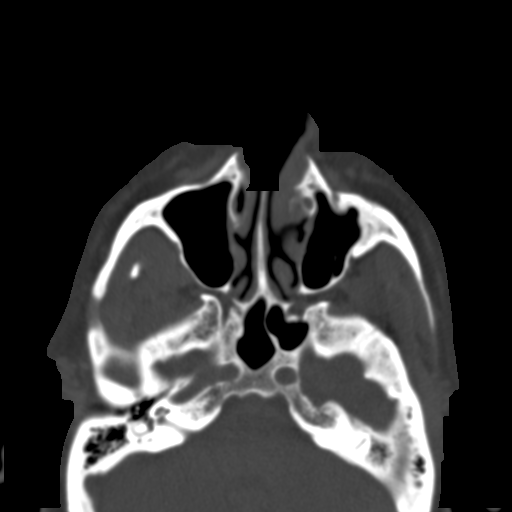
[im 10/36  brain]
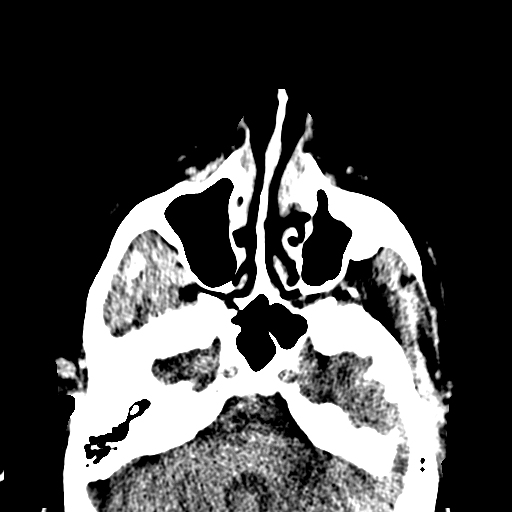
[im 10/36  bone]
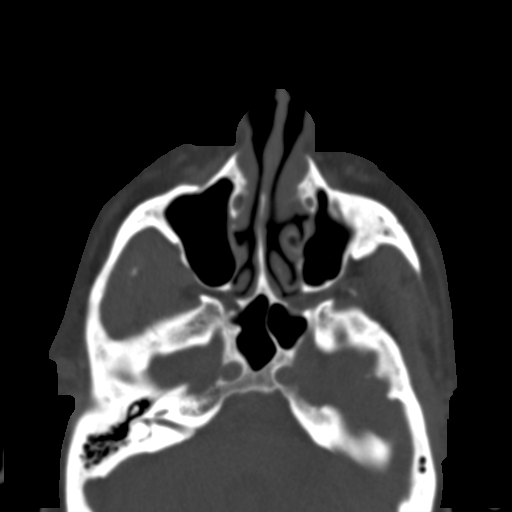
[im 13/36  bone]
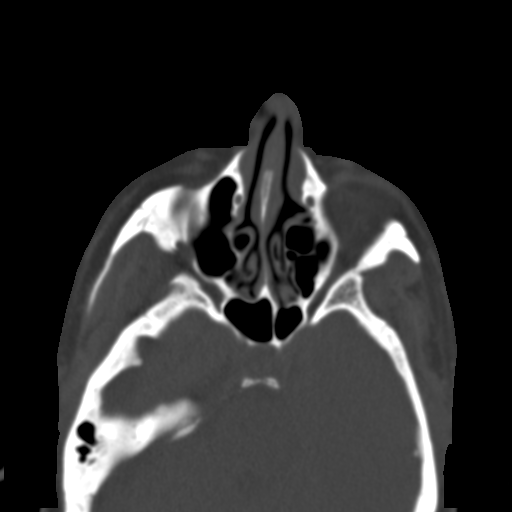
[im 15/36  bone]
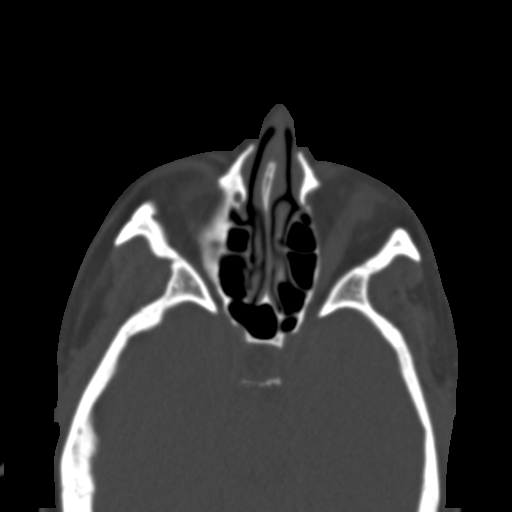
[im 17/36  bone]
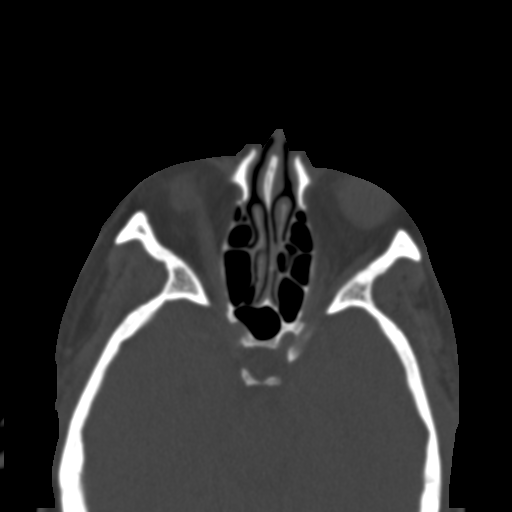
[im 19/36  brain]
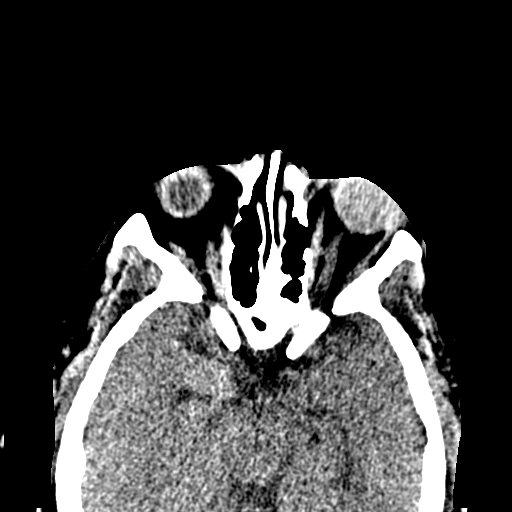
[im 19/36  bone]
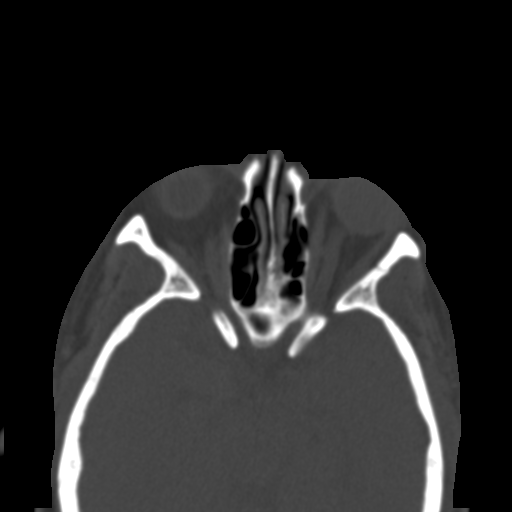
[im 21/36  bone]
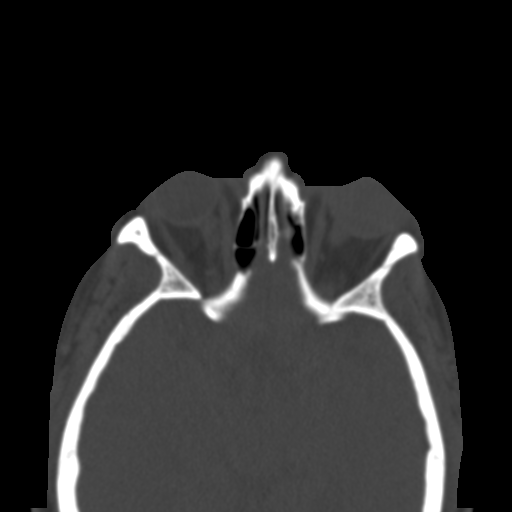
[im 23/36  bone]
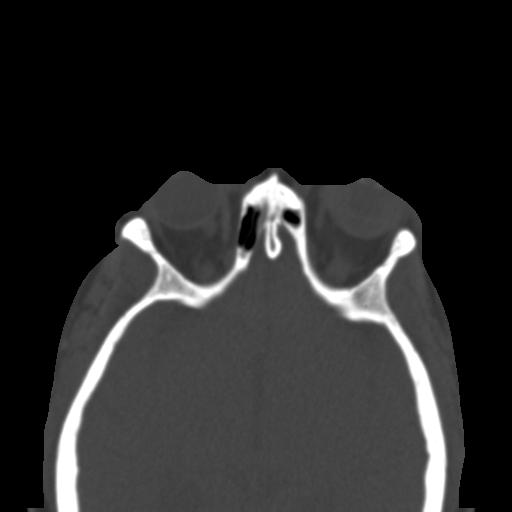
[im 26/36  bone]
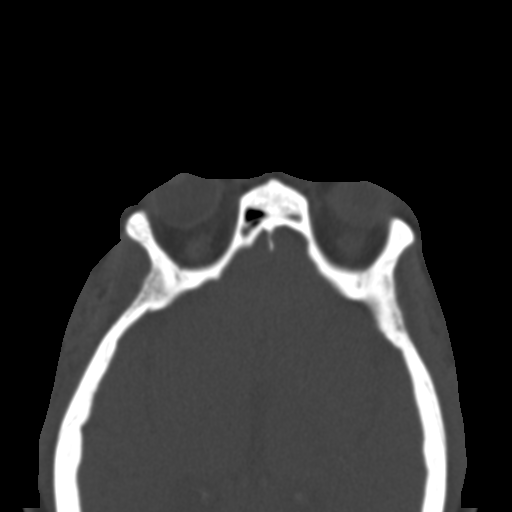
[im 27/36  brain]
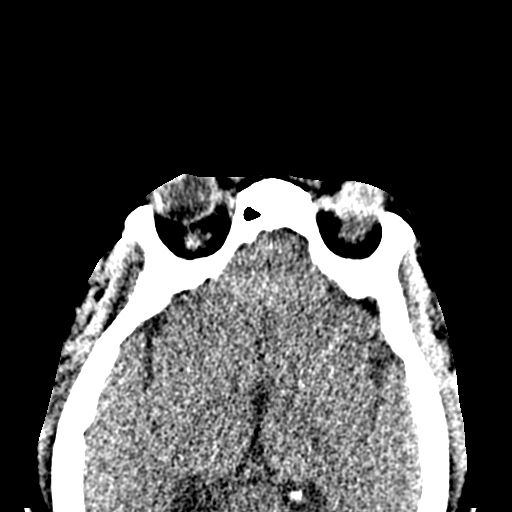
[im 27/36  bone]
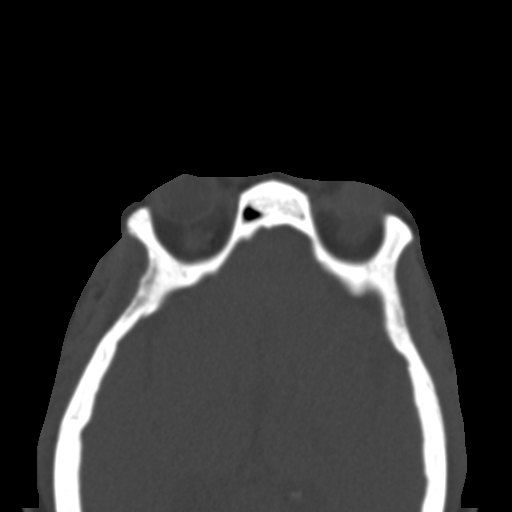
[im 29/36  bone]
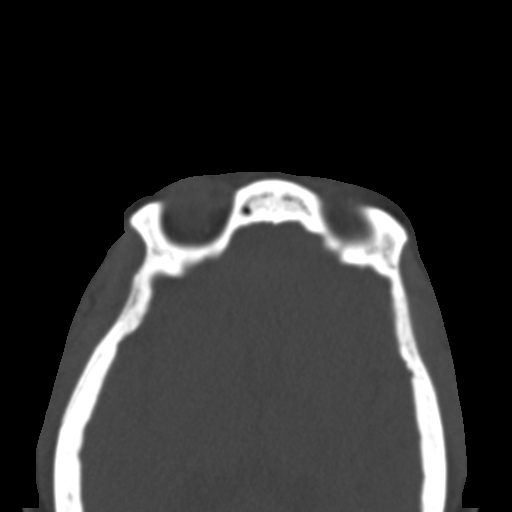
[im 32/36  bone]
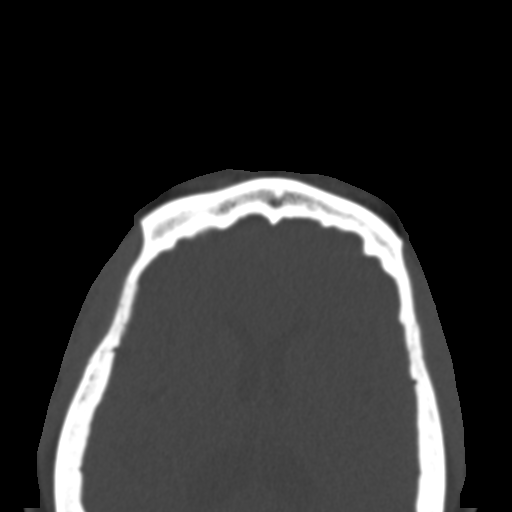
[im 34/36  bone]
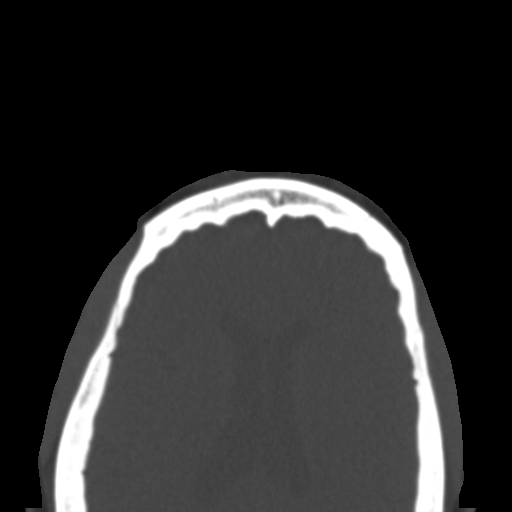

[16 of 30 positions shown; findings below may reference images not displayed]

FINDINGS: Poor contrast opacification.

Orbits: There is mildly increased density along the lateral and
posterior aspect of the left globe likely reflecting sequelae of
prior detachment. Calcified left lens. There is thin curvilinear
calcification along the anterior sclera. Extraocular muscles are
symmetric and unremarkable. Optic nerve sheath complexes are
unremarkable. There is prolapse of the lacrimal glands bilaterally.
The left lacrimal gland is larger. There is no significant
enlargement or apparent mass.

Visualized sinuses: Minor mucosal thickening.

Soft tissues: Unremarkable.

Limited intracranial: No acute abnormality.
IMPRESSION: Prolapse of nonenlarged lacrimal glands without apparent mass.

## 2021-02-24 MED ORDER — IOPAMIDOL (ISOVUE-300) INJECTION 61%
75.0000 mL | Freq: Once | INTRAVENOUS | Status: AC | PRN
  Administered 2021-02-24: 60 mL via INTRAVENOUS

## 2021-03-07 ENCOUNTER — Encounter: Payer: Self-pay | Admitting: Internal Medicine

## 2021-03-07 ENCOUNTER — Ambulatory Visit (INDEPENDENT_AMBULATORY_CARE_PROVIDER_SITE_OTHER): Payer: 59 | Admitting: Internal Medicine

## 2021-03-07 ENCOUNTER — Other Ambulatory Visit: Payer: Self-pay

## 2021-03-07 VITALS — BP 114/71 | HR 67 | Temp 98.1°F

## 2021-03-07 DIAGNOSIS — B20 Human immunodeficiency virus [HIV] disease: Secondary | ICD-10-CM

## 2021-03-07 DIAGNOSIS — Z79899 Other long term (current) drug therapy: Secondary | ICD-10-CM | POA: Diagnosis not present

## 2021-03-07 DIAGNOSIS — N183 Chronic kidney disease, stage 3 unspecified: Secondary | ICD-10-CM

## 2021-03-07 DIAGNOSIS — R109 Unspecified abdominal pain: Secondary | ICD-10-CM

## 2021-03-07 DIAGNOSIS — R10A1 Flank pain, right side: Secondary | ICD-10-CM

## 2021-03-07 DIAGNOSIS — N12 Tubulo-interstitial nephritis, not specified as acute or chronic: Secondary | ICD-10-CM | POA: Diagnosis not present

## 2021-03-07 NOTE — Progress Notes (Signed)
RFV: follow up for hiv disease  Patient ID: Katrina Wolfe, female   DOB: [birth year], 55 y.o.   MRN: [id number]  HPI  Katrina Wolfe is a 55yo F with obesity, diabetes, left eye blindness. Well controlled hiv disease, CD 4 count 660/VL<20, on biktarvy. Has excellent adherence. She states that she was to undergo enucleation of left eye however had issues with ophtho and insurance coverage. Occasionally still has left eye pain.  Was treated for pneumonia, ruled out covid  Not had covid vaccine.  Right flank pain and noticing darker urine x 2 weeks. She is not having fever, chills, n/v/,nor dysuria   Outpatient Encounter Medications as of 03/07/2021  Medication Sig  . aspirin EC 81 MG tablet Take 1 tablet (81 mg total) by mouth 2 (two) times daily.  Marland Kitchen atorvastatin (LIPITOR) 10 MG tablet Take 10 mg by mouth every morning.   Marland Kitchen atropine 1 % ophthalmic solution Place 1 drop into both eyes 2 (two) times daily.  Marland Kitchen BIKTARVY 50-200-25 MG TABS tablet TAKE ONE TABLET BY MOUTH every evening with dinner  . calcium carbonate (TUMS - DOSED IN MG ELEMENTAL CALCIUM) 500 MG chewable tablet Chew 1 tablet by mouth as needed for indigestion or heartburn.  . Calcium Carbonate-Vitamin D 600-200 MG-UNIT TABS 1 tablet with a meal  . Coconut Oil 1000 MG CAPS 2 capsules  . erythromycin ophthalmic ointment SMARTSIG:1 CENTIMETER(S) In Eye(s) 3 Times Daily  . ferrous sulfate 325 (65 FE) MG EC tablet 1 tablet  . gabapentin (NEURONTIN) 100 MG capsule Take 100 mg by mouth 3 (three) times daily.  Marland Kitchen glimepiride (AMARYL) 2 MG tablet Take 2 mg by mouth daily.  Marland Kitchen HUMALOG MIX 75/25 KWIKPEN (75-25) 100 UNIT/ML Kwikpen Inject 56 Units into the skin 2 (two) times daily.   Marland Kitchen HYDROcodone-acetaminophen (NORCO/VICODIN) 5-325 MG tablet Take 1 tablet by mouth every 6 (six) hours as needed for severe pain.  . Insulin Syringe-Needle U-100 (INSULIN SYRINGE 1CC/31GX5/16") 31G X 5/16" 1 ML MISC 1 Units by Does not apply route 2 (two) times  daily.  . Lancets (ONETOUCH ULTRASOFT) lancets   . lisinopril-hydrochlorothiazide (PRINZIDE,ZESTORETIC) 20-25 MG tablet Take 1 tablet by mouth daily.  . ONE TOUCH ULTRA TEST test strip   . TRULICITY 2.40 XB/3.5HG SOPN Inject into the skin.  . cloNIDine (CATAPRES) 0.3 MG tablet Take 0.3 mg by mouth 2 (two) times daily.  (Patient not taking: Reported on 03/07/2021)  . JARDIANCE 10 MG TABS tablet Take 10 mg by mouth every morning. (Patient not taking: Reported on 03/07/2021)  . VICTOZA 18 MG/3ML SOPN Inject 1.8 mg into the skin every morning.  (Patient not taking: Reported on 03/07/2021)   No facility-administered encounter medications on file as of 03/07/2021.     Patient Active Problem List   Diagnosis Date Noted  . Osteomyelitis of foot ([place]) 04/04/2019  . Hardware complicating wound infection ([place])   . Subacute osteomyelitis, left ankle and foot ([place])   . Open displaced fracture of fifth metatarsal bone of left foot   . Healthcare maintenance 04/03/2019  . HIV disease ([place]) 10/27/2015  . DM type 2 (diabetes mellitus, type 2) ([place]) 10/27/2015  . Hyperlipidemia 10/27/2015  . Essential hypertension 10/27/2015  . Severe vulvar dysplasia, histologically confirmed 10/15/2015     Health Maintenance Due  Topic Date Due  . HEMOGLOBIN A1C  Never done  . FOOT EXAM  Never done  . OPHTHALMOLOGY EXAM  Never done  . COVID-19 Vaccine (1) Never done  . COLONOSCOPY (  Pts 45-52yrs Insurance coverage will need to be confirmed)  Never done  . INFLUENZA VACCINE  07/18/2020     Review of Systems + flank pain otherwise no fever/ chills/nightsweat/ Physical Exam   BP 114/71   Pulse 67   Temp 98.1 F (36.7 C) (Oral)   SpO2 99%   Physical Exam  Constitutional:  oriented to person, place, and time. appears well-developed and well-nourished. No distress.  HENT: [place]/AT, PERRLA, no scleral icterus Mouth/Throat: Oropharynx is clear and moist. No oropharyngeal exudate.  Cardiovascular: Normal rate,  regular rhythm and normal heart sounds. Exam reveals no gallop and no friction rub.  No murmur heard.  Pulmonary/Chest: Effort normal and breath sounds normal. No respiratory distress.  has no wheezes.  Neck = supple, no nuchal rigidity Abdominal: Soft. Bowel sounds are normal.  exhibits no distension. +flank pain Lymphadenopathy: no cervical adenopathy. No axillary adenopathy Neurological: alert and oriented to person, place, and time.  Skin: Skin is warm and dry. No rash noted. No erythema.  Psychiatric: a normal mood and affect.  behavior is normal.   Lab Results  Component Value Date   CD4TCELL 28 (L) 02/22/2021   Lab Results  Component Value Date   CD4TABS 654 02/22/2021   CD4TABS 660 07/06/2020   CD4TABS 563 03/01/2020   Lab Results  Component Value Date   HIV1RNAQUANT Not Detected 02/22/2021   Lab Results  Component Value Date   HEPBSAB NEG 10/13/2015   Lab Results  Component Value Date   LABRPR NON-REACTIVE 02/22/2021    CBC Lab Results  Component Value Date   WBC 12.5 (H) 02/22/2021   RBC 4.11 02/22/2021   HGB 13.5 02/22/2021   HCT 38.9 02/22/2021   PLT 330 02/22/2021   MCV 94.6 02/22/2021   MCH 32.8 02/22/2021   MCHC 34.7 02/22/2021   RDW 13.3 02/22/2021   LYMPHSABS 2,488 02/22/2021   MONOABS 1.0 03/01/2019   EOSABS 275 02/22/2021    BMET Lab Results  Component Value Date   NA 138 02/22/2021   K 4.5 02/22/2021   CL 106 02/22/2021   CO2 20 02/22/2021   GLUCOSE 164 (H) 02/22/2021   BUN 16 02/22/2021   CREATININE 1.42 (H) 02/22/2021   CALCIUM 9.2 02/22/2021   GFRNONAA 42 (L) 02/22/2021   GFRAA 48 (L) 02/22/2021      Assessment and Plan  HIV disease = well controlled  Right flank pain/cva tenderness) = will get ua and urine cx ; bmp  Addendum = gorup b strep. Sent in treatment  ckd 3= concern about Increase in Cr = will check kidney ultrasound, not sure if she is having hydronephrosis or pyelo  EO:FHQRFX hussain - not sure if they had  her Cr is trending up  Health maintenance = colonosocpy had it in 2010-2012.  Likely needs to get screening colonoscopy.   Left foot deformity = has upcoming foot surgery. Tendons causing unequal pressure, at risk pressure wounds. Just getting wound care for small residual ulcer

## 2021-03-08 ENCOUNTER — Ambulatory Visit: Payer: 59 | Admitting: Internal Medicine

## 2021-03-08 LAB — BASIC METABOLIC PANEL
BUN/Creatinine Ratio: 11 (calc) (ref 6–22)
BUN: 19 mg/dL (ref 7–25)
CO2: 21 mmol/L (ref 20–32)
Calcium: 9.4 mg/dL (ref 8.6–10.4)
Chloride: 107 mmol/L (ref 98–110)
Creat: 1.71 mg/dL — ABNORMAL HIGH (ref 0.50–1.05)
Glucose, Bld: 97 mg/dL (ref 65–99)
Potassium: 4.2 mmol/L (ref 3.5–5.3)
Sodium: 141 mmol/L (ref 135–146)

## 2021-03-09 LAB — MICROALBUMIN / CREATININE URINE RATIO
Creatinine, Urine: 134 mg/dL (ref 20–275)
Microalb Creat Ratio: 9 mcg/mg creat (ref <30)
Microalb, Ur: 1.2 mg/dL

## 2021-03-09 LAB — URINALYSIS, ROUTINE W REFLEX MICROSCOPIC
Bacteria, UA: NONE SEEN /HPF
Bilirubin Urine: NEGATIVE
Hyaline Cast: NONE SEEN /LPF
Ketones, ur: NEGATIVE
Leukocytes,Ua: NEGATIVE
Nitrite: NEGATIVE
Protein, ur: NEGATIVE
Specific Gravity, Urine: 1.03 (ref 1.001–1.03)
pH: 6.5 (ref 5.0–8.0)

## 2021-03-09 LAB — URINE CULTURE
MICRO NUMBER:: 11672009
SPECIMEN QUALITY:: ADEQUATE

## 2021-03-12 ENCOUNTER — Other Ambulatory Visit: Payer: Self-pay | Admitting: Internal Medicine

## 2021-03-12 MED ORDER — AMOXICILLIN 500 MG PO CAPS: 500.0000 mg | ORAL_CAPSULE | Freq: Three times a day (TID) | ORAL | 0 refills | Status: DC

## 2021-03-15 ENCOUNTER — Other Ambulatory Visit: Payer: Self-pay

## 2021-03-15 ENCOUNTER — Ambulatory Visit: Payer: 59

## 2021-03-18 ENCOUNTER — Ambulatory Visit
Admission: RE | Admit: 2021-03-18 | Discharge: 2021-03-18 | Disposition: A | Discharge status: Home / Self Care | Payer: 59 | Source: Ambulatory Visit | Attending: Internal Medicine | Admitting: Internal Medicine

## 2021-03-18 DIAGNOSIS — R109 Unspecified abdominal pain: Secondary | ICD-10-CM

## 2021-03-18 IMAGING — US US RENAL
1 series · 14 of 25 positions shown · non-contrast
Comparison: None.

CLINICAL DATA: Right flank pain.

EXAM:
RENAL / URINARY TRACT ULTRASOUND COMPLETE

[Series 1: us renal · 0.26mm/px · 14 of 36 slices shown]
[im 1/36]
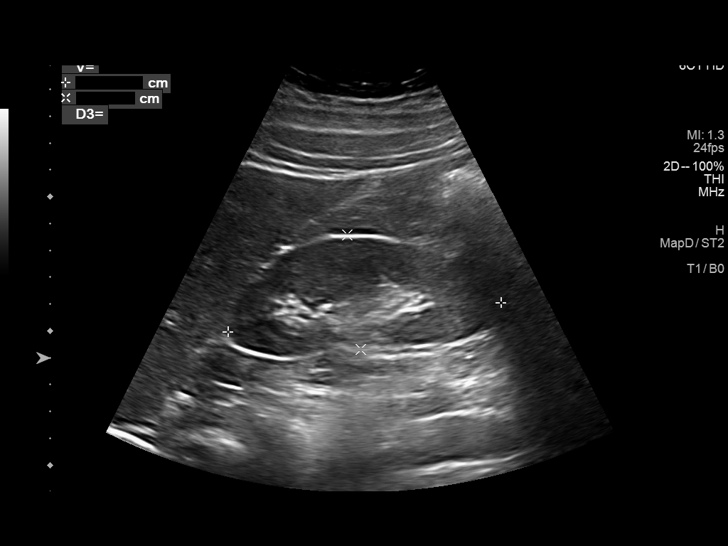
[im 3/36]
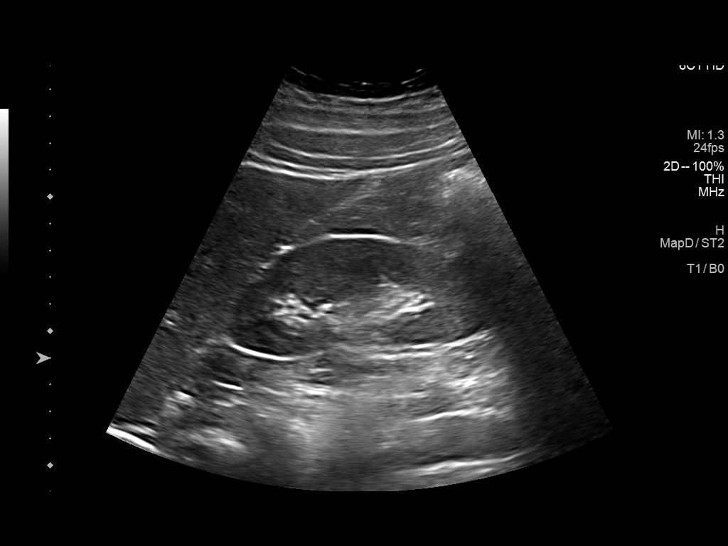
[im 6/36]
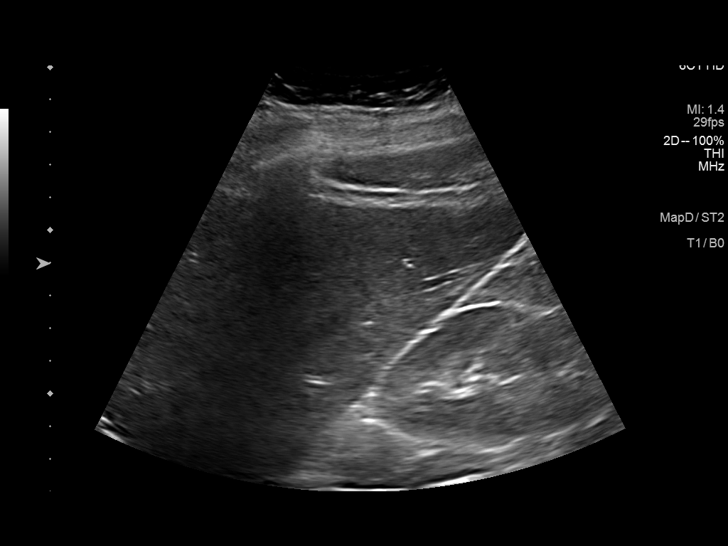
[im 9/36]
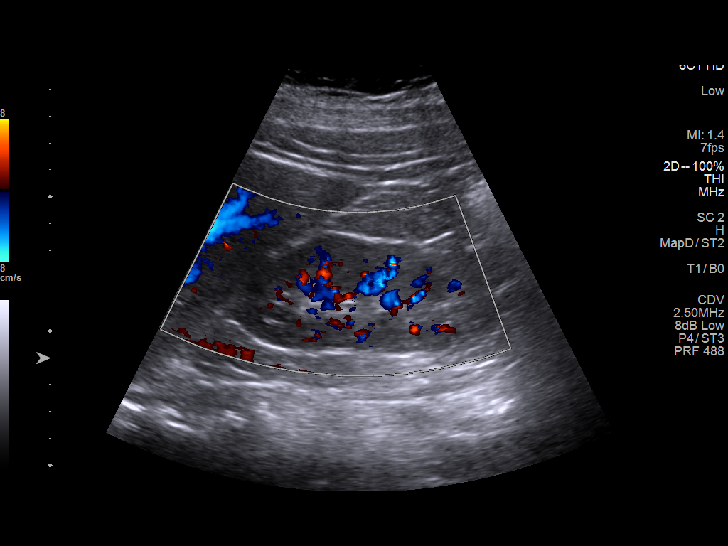
[im 12/36]
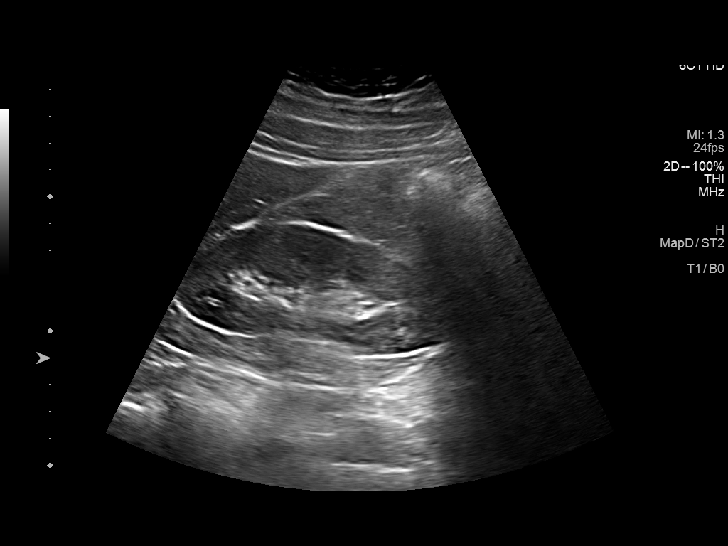
[im 14/36]
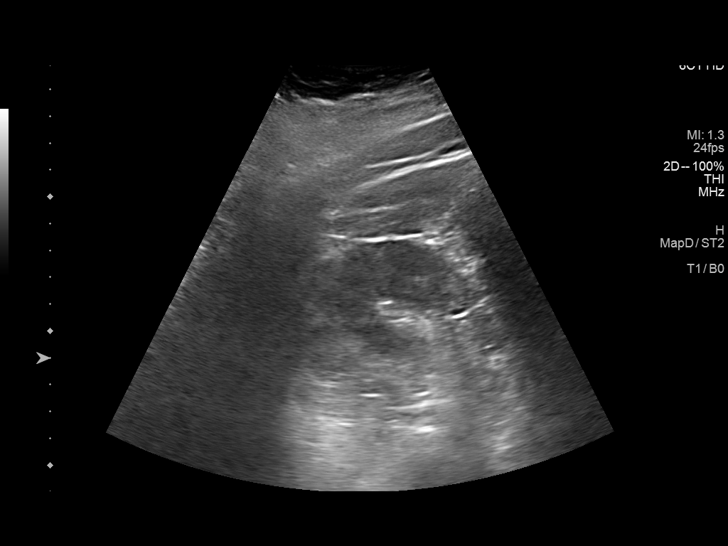
[im 17/36]
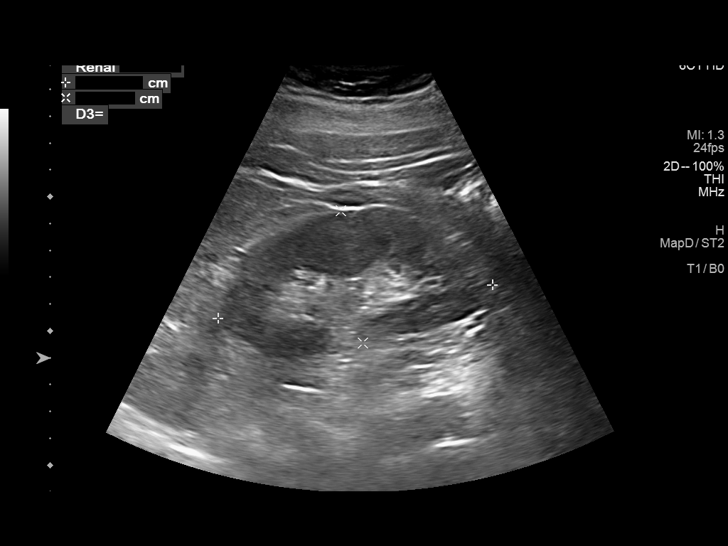
[im 19/36]
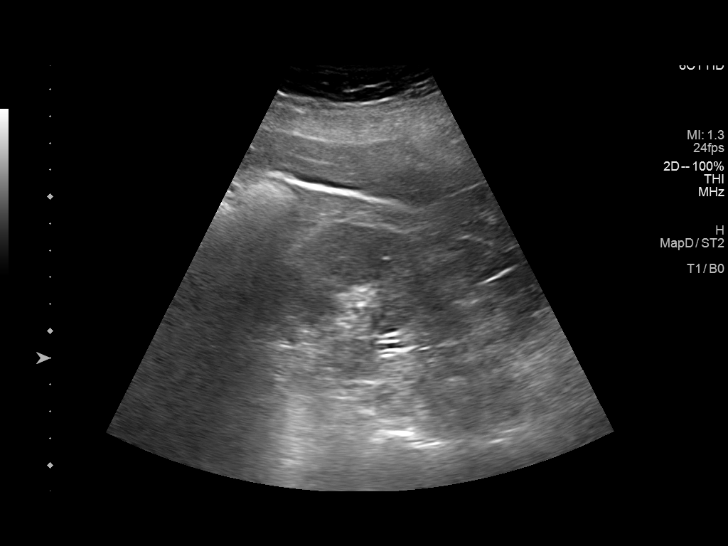
[im 22/36]
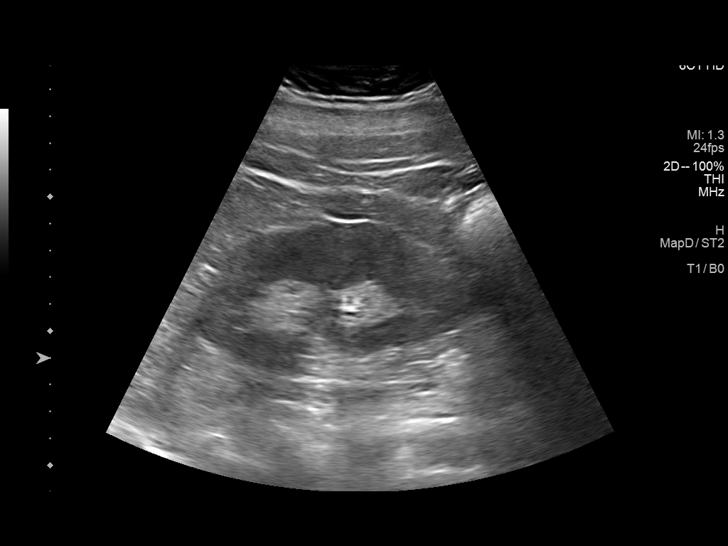
[im 24/36]
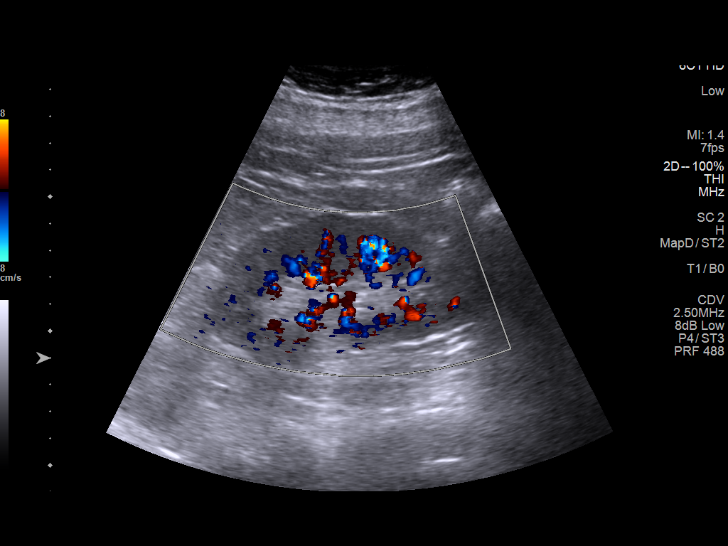
[im 27/36]
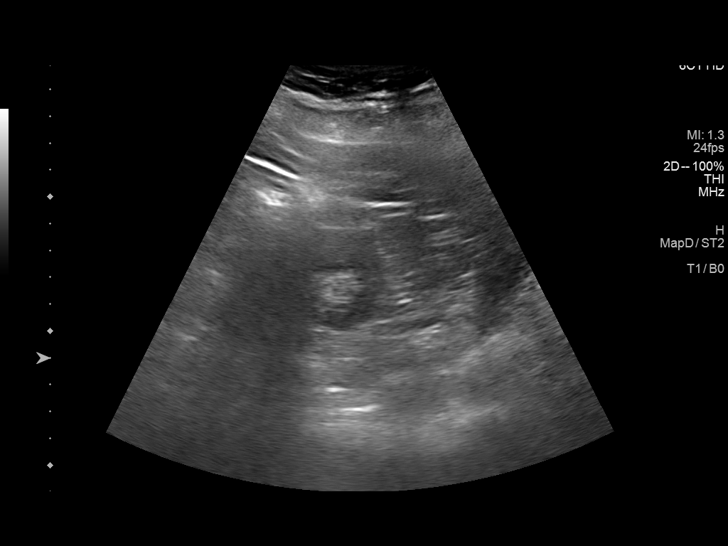
[im 30/36]
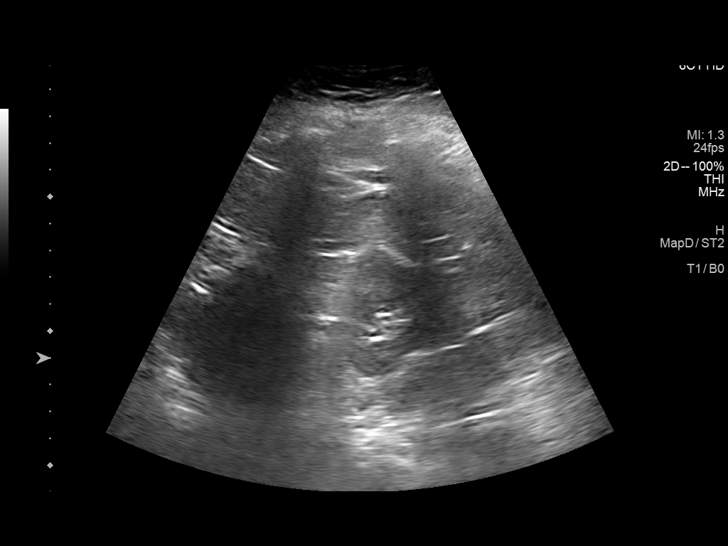
[im 33/36]
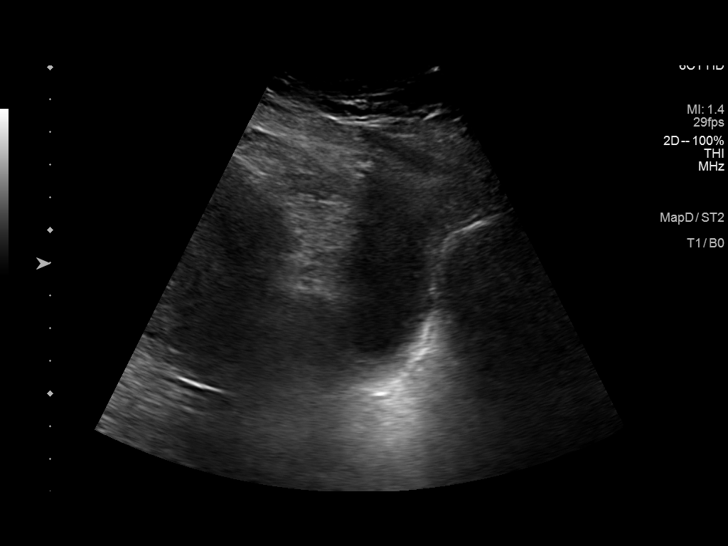
[im 36/36]
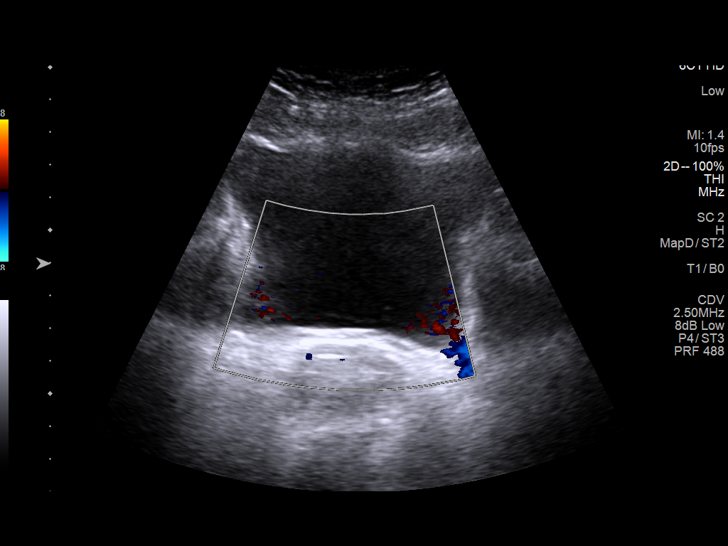

[14 of 25 positions shown; findings below may reference images not displayed]

FINDINGS: Right Kidney:

Renal measurements: 10.2 cm x 4.3 cm x 5.2 cm = volume: 120.0 mL.
Echogenicity within normal limits. No mass or hydronephrosis
visualized.

Left Kidney:

Renal measurements: 10.3 cm x 5.0 cm x 5.3 cm = volume: 142.6 mL.
Echogenicity within normal limits. No mass or hydronephrosis
visualized.

Bladder:

Appears normal for degree of bladder distention.

Other:

None.
IMPRESSION: Normal renal ultrasound.

## 2021-03-21 ENCOUNTER — Ambulatory Visit: Payer: Self-pay

## 2021-03-21 ENCOUNTER — Ambulatory Visit (INDEPENDENT_AMBULATORY_CARE_PROVIDER_SITE_OTHER): Payer: 59 | Admitting: Orthopedic Surgery

## 2021-03-21 ENCOUNTER — Encounter: Payer: Self-pay | Admitting: Orthopedic Surgery

## 2021-03-21 ENCOUNTER — Other Ambulatory Visit: Payer: Self-pay

## 2021-03-21 VITALS — Ht 69.5 in | Wt 264.0 lb

## 2021-03-21 DIAGNOSIS — M79672 Pain in left foot: Secondary | ICD-10-CM

## 2021-03-21 DIAGNOSIS — E11610 Type 2 diabetes mellitus with diabetic neuropathic arthropathy: Secondary | ICD-10-CM

## 2021-03-21 DIAGNOSIS — L97521 Non-pressure chronic ulcer of other part of left foot limited to breakdown of skin: Secondary | ICD-10-CM

## 2021-03-22 ENCOUNTER — Other Ambulatory Visit: Payer: Self-pay | Admitting: Internal Medicine

## 2021-03-22 DIAGNOSIS — B20 Human immunodeficiency virus [HIV] disease: Secondary | ICD-10-CM

## 2021-03-29 ENCOUNTER — Encounter: Payer: Self-pay | Admitting: Orthopedic Surgery

## 2021-03-29 NOTE — Progress Notes (Signed)
Office Visit Note   Patient: Katrina Wolfe           Date of Birth: Dec 22, 1965           MRN: [id number] Visit Date: 03/21/2021              Requested by: Wenda Low, MD [address] [place],  [place] [address] PCP: Wenda Low, MD  Chief Complaint  Patient presents with  . Left Ankle - Follow-up  . Left Foot - Follow-up      HPI: Patient is a 55 year old woman who presents in follow-up for the left foot and ankle with varus hindfoot deformity she is currently in a [employer] from [employer].  She states she does have pain and bleeding from the ulcer base of the fifth metatarsal with maceration.  Assessment & Plan: Visit Diagnoses:  1. Pain in left foot   2. Charcot's arthropathy associated with type 2 diabetes mellitus ([place])   3. Non-pressure chronic ulcer of other part of left foot limited to breakdown of skin ([place])     Plan: Ulcer was debrided continue with wound care reevaluate in 3 weeks  Follow-Up Instructions: Return in about 3 weeks (around 04/11/2021).   Ortho Exam  Patient is alert, oriented, no adenopathy, well-dressed, normal affect, normal respiratory effort. Examination patient has a well fitting Crow walker.  She has an ulcer base of the fifth metatarsal with maceration.  After informed consent a 10 blade knife was used to debride the skin and soft tissue back to healthy viable granulation tissue this was touched with silver nitrate the ulcer was 3 cm in diameter 3 mm deep after debridement prior to debridement this was 1 cm in diameter.  We will continue with dry gauze dressing changes.  Imaging: No results found. No images are attached to the encounter.  Labs: Lab Results  Component Value Date   REPTSTATUS 04/08/2019 FINAL 04/04/2019   GRAMSTAIN  04/04/2019    FEW WBC PRESENT,BOTH PMN AND MONONUCLEAR NO ORGANISMS SEEN    CULT  04/04/2019    FEW PSEUDOMONAS AERUGINOSA FEW SERRATIA MARCESCENS FEW STAPHYLOCOCCUS AUREUS CRITICAL  RESULT CALLED TO, READ BACK BY AND VERIFIED WITH: RN Suzan Garibaldi [phone] FCP NO ANAEROBES ISOLATED Performed at [facility], [place] [address] [place], [place] [address]    LABORGA PSEUDOMONAS AERUGINOSA 04/04/2019   LABORGA SERRATIA MARCESCENS 04/04/2019   LABORGA STAPHYLOCOCCUS AUREUS 04/04/2019     Lab Results  Component Value Date   ALBUMIN 3.9 03/01/2019   ALBUMIN 3.5 (L) 05/24/2017   ALBUMIN 4.2 11/23/2016    No results found for: MG No results found for: VD25OH  No results found for: PREALBUMIN CBC EXTENDED Latest Ref Rng & Units 02/22/2021 07/06/2020 03/01/2020  WBC 3.8 - 10.8 Thousand/uL 12.5(H) 10.8 10.7  RBC 3.80 - 5.10 Million/uL 4.11 4.32 4.50  HGB 11.7 - 15.5 g/dL 13.5 13.5 12.6  HCT 35.0 - 45.0 % 38.9 39.7 38.3  PLT 140 - 400 Thousand/uL 330 370 351  NEUTROABS 1,500 - 7,800 cells/uL 9,088(H) 7,538 7,180  LYMPHSABS 850 - 3,900 cells/uL 2,488 2,473 2,482     Body mass index is 38.43 kg/m.  Orders:  Orders Placed This Encounter  Procedures  . XR Foot 2 Views Left   No orders of the defined types were placed in this encounter.    Procedures: No procedures performed  Clinical Data: No additional findings.  ROS:  All other systems negative, except as noted in  the HPI. Review of Systems  Objective: Vital Signs: Ht 5' 9.5" (1.765 m)   Wt 264 lb (119.7 kg)   BMI 38.43 kg/m   Specialty Comments:  No specialty comments available.  PMFS History: Patient Active Problem List   Diagnosis Date Noted  . Osteomyelitis of foot ([place]) 04/04/2019  . Hardware complicating wound infection ([place])   . Subacute osteomyelitis, left ankle and foot ([place])   . Open displaced fracture of fifth metatarsal bone of left foot   . Healthcare maintenance 04/03/2019  . HIV disease ([place]) 10/27/2015  . DM type 2 (diabetes mellitus, type 2) ([place]) 10/27/2015  . Hyperlipidemia 10/27/2015  . Essential hypertension 10/27/2015  . Severe vulvar dysplasia, histologically  confirmed 10/15/2015   Past Medical History:  Diagnosis Date  . Abnormal uterine bleeding (AUB)   . Arthritis    knees, fingers  . Asthma    as child, no problems in 20 yrs  . Cancer ([place])   . CKD (chronic kidney disease), stage II   . Congenital cardiomegaly    per pt has always been told since childhood and siblings   . GERD (gastroesophageal reflux disease)   . History of genital warts   . HIV (human immunodeficiency virus infection) ([place])    [place]-- DR Carlyle Basques  . Hypertension   . Intermittent palpitations    mild-- no meds  . Legally blind in left eye, as defined in [place]    trauma as child  . Tuberculosis    ? 2005 or 2007  . Type 2 diabetes mellitus (HCC)    Type II   . Uterine fibroid   . VIN III (vulvar intraepithelial neoplasia III)    and Verrucoid lesion on the mons  . Wears glasses     Family History  Problem Relation Age of Onset  . Hypertension Mother   . Diabetes Father   . Cancer Brother   . Breast cancer Cousin     Past Surgical History:  Procedure Laterality Date  . CESAREAN SECTION  2001  &  1984  . CO2 LASER APPLICATION N/A 56/01/1307   Procedure: CO2 LASER APPLICATION AND WIDE VOCAL EXCSION OF LESION ON MONS PUBIS;  Surgeon: Marti Sleigh, MD;  Location: [facility];  Service: Gynecology;  Laterality: N/A;   CASE CANCELLED   . CO2 LASER APPLICATION N/A 6/57/8469   Procedure: CO2 LASER OF VULVAR;  Surgeon: Marti Sleigh, MD;  Location: [facility];  Service: Gynecology;  Laterality: N/A;  . CORNEAL TRANSPLANT Left 2006   failed  . D & C HYSTEROSCOPY /  RESECTION FIBROID  2004  . DILATATION & CURETTAGE/HYSTEROSCOPY WITH MYOSURE N/A 10/01/2019   Procedure: DILATATION & CURETTAGE/HYSTEROSCOPY WITH MYOSURE and HYDROTHERMAL ABLATION;  Surgeon: Thurnell Lose, MD;  Location: [facility];  Service: Gynecology;  Laterality: N/A;  HTA rep will be here.  Confirmed on 09/25/19 CS  . HARDWARE REMOVAL Left 04/04/2019   Procedure: EXCISION BONE AND REMOVE DEEP HARDWARE LEFT 5TH METATARSAL;  Surgeon: Newt Minion, MD;  Location: [facility];  Service: Orthopedics;  Laterality: Left;  . KNEE ARTHROSCOPY W/ ACL RECONSTRUCTION Bilateral 2008  . ORIF TOE FRACTURE Left 08/02/2017   Procedure: OPEN REDUCTION INTERNAL FIXATION (ORIF) FIFTH METATARSAL (TOE) BASE FRACTURE;  Surgeon: Wylene Simmer, MD;  Location: [facility];  Service: Orthopedics;  Laterality: Left;  Marland Kitchen VULVECTOMY N/A 01/07/2016   Procedure: WIDE LOCAL EXCISION;  Surgeon: Marti Sleigh,  MD;  Location: [facility];  Service: Gynecology;  Laterality: N/A;  mons pubis as site   Social History   Occupational History  . Not on file  Tobacco Use  . Smoking status: Never Smoker  . Smokeless tobacco: Never Used  Vaping Use  . Vaping Use: Never used  Substance and Sexual Activity  . Alcohol use: No    Alcohol/week: 0.0 standard drinks  . Drug use: No  . Sexual activity: Yes    Partners: Male    Birth control/protection: None, Condom    Comment: declined condoms 03/07/21

## 2021-04-11 ENCOUNTER — Encounter: Payer: Self-pay | Admitting: Orthopedic Surgery

## 2021-04-11 ENCOUNTER — Ambulatory Visit (INDEPENDENT_AMBULATORY_CARE_PROVIDER_SITE_OTHER): Payer: 59 | Admitting: Physician Assistant

## 2021-04-11 DIAGNOSIS — L97522 Non-pressure chronic ulcer of other part of left foot with fat layer exposed: Secondary | ICD-10-CM | POA: Diagnosis not present

## 2021-04-11 NOTE — Progress Notes (Signed)
Office Visit Note   Patient: Katrina Wolfe           Date of Birth: [birth year]           MRN: [id number] Visit Date: 04/11/2021              Requested by: Wenda Low, MD [address] [place],  [place] [address] PCP: Wenda Low, MD  Chief Complaint  Patient presents with  . Left Foot - Follow-up      HPI: Patient follows up today for her left foot she comes in monthly for periodic trimming on the lateral side of her foot because of her cavus foot deformity.  She does wear a Higher education careers adviser.  Assessment & Plan: Visit Diagnoses: No diagnosis found.  Plan: Follow-up sooner if any concerns  Follow-Up Instructions: No follow-ups on file.   Ortho Exam  Patient is alert, oriented, no adenopathy, well-dressed, normal affect, normal respiratory effort. She has some slight skin maceration around the ulcer which is now very superficial.  There is no surrounding cellulitis or signs of infection.  Some callusing after verbal consent was obtained this was trimmed to a healthy soft surface follow-up in 1 month  Imaging: No results found. No images are attached to the encounter.  Labs: Lab Results  Component Value Date   REPTSTATUS 04/08/2019 FINAL 04/04/2019   GRAMSTAIN  04/04/2019    FEW WBC PRESENT,BOTH PMN AND MONONUCLEAR NO ORGANISMS SEEN    CULT  04/04/2019    FEW PSEUDOMONAS AERUGINOSA FEW SERRATIA MARCESCENS FEW STAPHYLOCOCCUS AUREUS CRITICAL RESULT CALLED TO, READ BACK BY AND VERIFIED WITH: RN Suzan Garibaldi [phone] FCP NO ANAEROBES ISOLATED Performed at [facility], [place] [address], [place], [place] [address]    LABORGA PSEUDOMONAS AERUGINOSA 04/04/2019   LABORGA SERRATIA MARCESCENS 04/04/2019   LABORGA STAPHYLOCOCCUS AUREUS 04/04/2019     Lab Results  Component Value Date   ALBUMIN 3.9 03/01/2019   ALBUMIN 3.5 (L) 05/24/2017   ALBUMIN 4.2 11/23/2016    No results found for: MG No results found for: VD25OH  No results found for:  PREALBUMIN CBC EXTENDED Latest Ref Rng & Units 02/22/2021 07/06/2020 03/01/2020  WBC 3.8 - 10.8 Thousand/uL 12.5(H) 10.8 10.7  RBC 3.80 - 5.10 Million/uL 4.11 4.32 4.50  HGB 11.7 - 15.5 g/dL 13.5 13.5 12.6  HCT 35.0 - 45.0 % 38.9 39.7 38.3  PLT 140 - 400 Thousand/uL 330 370 351  NEUTROABS 1,500 - 7,800 cells/uL 9,088(H) 7,538 7,180  LYMPHSABS 850 - 3,900 cells/uL 2,488 2,473 2,482     There is no height or weight on file to calculate BMI.  Orders:  No orders of the defined types were placed in this encounter.  No orders of the defined types were placed in this encounter.    Procedures: No procedures performed  Clinical Data: No additional findings.  ROS:  All other systems negative, except as noted in the HPI. Review of Systems  Objective: Vital Signs: There were no vitals taken for this visit.  Specialty Comments:  No specialty comments available.  PMFS History: Patient Active Problem List   Diagnosis Date Noted  . Osteomyelitis of foot ([place]) 04/04/2019  . Hardware complicating wound infection ([place])   . Subacute osteomyelitis, left ankle and foot ([place])   . Open displaced fracture of fifth metatarsal bone of left foot   . Healthcare maintenance 04/03/2019  . HIV disease ([place]) 10/27/2015  . DM type 2 (diabetes mellitus, type 2) ([place])  10/27/2015  . Hyperlipidemia 10/27/2015  . Essential hypertension 10/27/2015  . Severe vulvar dysplasia, histologically confirmed 10/15/2015   Past Medical History:  Diagnosis Date  . Abnormal uterine bleeding (AUB)   . Arthritis    knees, fingers  . Asthma    as child, no problems in 20 yrs  . Cancer ([place])   . CKD (chronic kidney disease), stage II   . Congenital cardiomegaly    per pt has always been told since childhood and siblings   . GERD (gastroesophageal reflux disease)   . History of genital warts   . HIV (human immunodeficiency virus infection) ([place])    [facility]-- DR Carlyle Basques   . Hypertension   . Intermittent palpitations    mild-- no meds  . Legally blind in left eye, as defined in [place]    trauma as child  . Tuberculosis    ? 2005 or 2007  . Type 2 diabetes mellitus (HCC)    Type II   . Uterine fibroid   . VIN III (vulvar intraepithelial neoplasia III)    and Verrucoid lesion on the mons  . Wears glasses     Family History  Problem Relation Age of Onset  . Hypertension Mother   . Diabetes Father   . Cancer Brother   . Breast cancer Cousin     Past Surgical History:  Procedure Laterality Date  . CESAREAN SECTION  2001  &  1984  . CO2 LASER APPLICATION N/A 02/19/7424   Procedure: CO2 LASER APPLICATION AND WIDE VOCAL EXCSION OF LESION ON MONS PUBIS;  Surgeon: Marti Sleigh, MD;  Location: [facility];  Service: Gynecology;  Laterality: N/A;   CASE CANCELLED   . CO2 LASER APPLICATION N/A 9/56/3875   Procedure: CO2 LASER OF VULVAR;  Surgeon: Marti Sleigh, MD;  Location: [facility];  Service: Gynecology;  Laterality: N/A;  . CORNEAL TRANSPLANT Left 2006   failed  . D & C HYSTEROSCOPY /  RESECTION FIBROID  2004  . DILATATION & CURETTAGE/HYSTEROSCOPY WITH MYOSURE N/A 10/01/2019   Procedure: DILATATION & CURETTAGE/HYSTEROSCOPY WITH MYOSURE and HYDROTHERMAL ABLATION;  Surgeon: Thurnell Lose, MD;  Location: [facility];  Service: Gynecology;  Laterality: N/A;  HTA rep will be here. Confirmed on 09/25/19 CS  . HARDWARE REMOVAL Left 04/04/2019   Procedure: EXCISION BONE AND REMOVE DEEP HARDWARE LEFT 5TH METATARSAL;  Surgeon: Newt Minion, MD;  Location: [facility];  Service: Orthopedics;  Laterality: Left;  . KNEE ARTHROSCOPY W/ ACL RECONSTRUCTION Bilateral 2008  . ORIF TOE FRACTURE Left 08/02/2017   Procedure: OPEN REDUCTION INTERNAL FIXATION (ORIF) FIFTH METATARSAL (TOE) BASE FRACTURE;  Surgeon: Wylene Simmer, MD;  Location: [facility];  Service: Orthopedics;  Laterality: Left;  Marland Kitchen  VULVECTOMY N/A 01/07/2016   Procedure: WIDE LOCAL EXCISION;  Surgeon: Marti Sleigh, MD;  Location: [facility];  Service: Gynecology;  Laterality: N/A;  mons pubis as site   Social History   Occupational History  . Not on file  Tobacco Use  . Smoking status: Never Smoker  . Smokeless tobacco: Never Used  Vaping Use  . Vaping Use: Never used  Substance and Sexual Activity  . Alcohol use: No    Alcohol/week: 0.0 standard drinks  . Drug use: No  . Sexual activity: Yes    Partners: Male    Birth control/protection: None, Condom    Comment: declined condoms 03/07/21

## 2021-04-12 ENCOUNTER — Ambulatory Visit: Payer: 59

## 2021-04-12 ENCOUNTER — Other Ambulatory Visit: Payer: Self-pay

## 2021-04-18 ENCOUNTER — Other Ambulatory Visit: Payer: Self-pay | Admitting: Obstetrics and Gynecology

## 2021-04-18 DIAGNOSIS — R9389 Abnormal findings on diagnostic imaging of other specified body structures: Secondary | ICD-10-CM | POA: Diagnosis not present

## 2021-04-28 DIAGNOSIS — E1140 Type 2 diabetes mellitus with diabetic neuropathy, unspecified: Secondary | ICD-10-CM | POA: Diagnosis not present

## 2021-04-28 DIAGNOSIS — E1142 Type 2 diabetes mellitus with diabetic polyneuropathy: Secondary | ICD-10-CM | POA: Diagnosis not present

## 2021-04-28 DIAGNOSIS — N182 Chronic kidney disease, stage 2 (mild): Secondary | ICD-10-CM | POA: Diagnosis not present

## 2021-04-28 DIAGNOSIS — E7800 Pure hypercholesterolemia, unspecified: Secondary | ICD-10-CM | POA: Diagnosis not present

## 2021-04-28 DIAGNOSIS — E11621 Type 2 diabetes mellitus with foot ulcer: Secondary | ICD-10-CM | POA: Diagnosis not present

## 2021-04-28 DIAGNOSIS — E1165 Type 2 diabetes mellitus with hyperglycemia: Secondary | ICD-10-CM | POA: Diagnosis not present

## 2021-04-28 DIAGNOSIS — I10 Essential (primary) hypertension: Secondary | ICD-10-CM | POA: Diagnosis not present

## 2021-05-09 ENCOUNTER — Ambulatory Visit: Payer: 59 | Admitting: Physician Assistant

## 2021-05-10 ENCOUNTER — Ambulatory Visit: Payer: 59

## 2021-05-11 ENCOUNTER — Other Ambulatory Visit: Payer: Self-pay

## 2021-05-11 DIAGNOSIS — Z113 Encounter for screening for infections with a predominantly sexual mode of transmission: Secondary | ICD-10-CM

## 2021-05-11 DIAGNOSIS — B20 Human immunodeficiency virus [HIV] disease: Secondary | ICD-10-CM

## 2021-05-11 DIAGNOSIS — Z79899 Other long term (current) drug therapy: Secondary | ICD-10-CM

## 2021-05-12 DIAGNOSIS — E1165 Type 2 diabetes mellitus with hyperglycemia: Secondary | ICD-10-CM | POA: Diagnosis not present

## 2021-05-12 DIAGNOSIS — I10 Essential (primary) hypertension: Secondary | ICD-10-CM | POA: Diagnosis not present

## 2021-05-12 DIAGNOSIS — Z1389 Encounter for screening for other disorder: Secondary | ICD-10-CM | POA: Diagnosis not present

## 2021-05-12 DIAGNOSIS — E7800 Pure hypercholesterolemia, unspecified: Secondary | ICD-10-CM | POA: Diagnosis not present

## 2021-05-12 DIAGNOSIS — N182 Chronic kidney disease, stage 2 (mild): Secondary | ICD-10-CM | POA: Diagnosis not present

## 2021-05-12 DIAGNOSIS — Z0000 Encounter for general adult medical examination without abnormal findings: Secondary | ICD-10-CM | POA: Diagnosis not present

## 2021-05-12 DIAGNOSIS — Z794 Long term (current) use of insulin: Secondary | ICD-10-CM | POA: Diagnosis not present

## 2021-05-12 DIAGNOSIS — H5442A3 Blindness left eye category 3, normal vision right eye: Secondary | ICD-10-CM | POA: Diagnosis not present

## 2021-05-12 DIAGNOSIS — E1142 Type 2 diabetes mellitus with diabetic polyneuropathy: Secondary | ICD-10-CM | POA: Diagnosis not present

## 2021-05-12 DIAGNOSIS — L97521 Non-pressure chronic ulcer of other part of left foot limited to breakdown of skin: Secondary | ICD-10-CM | POA: Diagnosis not present

## 2021-05-17 DIAGNOSIS — I10 Essential (primary) hypertension: Secondary | ICD-10-CM | POA: Diagnosis not present

## 2021-05-19 ENCOUNTER — Other Ambulatory Visit: Payer: Self-pay

## 2021-05-19 ENCOUNTER — Other Ambulatory Visit: Payer: Medicare Other

## 2021-05-19 DIAGNOSIS — Z79899 Other long term (current) drug therapy: Secondary | ICD-10-CM | POA: Diagnosis not present

## 2021-05-19 DIAGNOSIS — B20 Human immunodeficiency virus [HIV] disease: Secondary | ICD-10-CM

## 2021-05-20 ENCOUNTER — Ambulatory Visit: Payer: Medicare Other | Admitting: Physician Assistant

## 2021-05-20 LAB — T-HELPER CELL (CD4) - (RCID CLINIC ONLY)
CD4 % Helper T Cell: 25 % — ABNORMAL LOW (ref 33–65)
CD4 T Cell Abs: 638 /uL (ref 400–1790)

## 2021-05-22 LAB — HIV-1 RNA QUANT-NO REFLEX-BLD
HIV 1 RNA Quant: NOT DETECTED Copies/mL
HIV-1 RNA Quant, Log: NOT DETECTED Log cps/mL

## 2021-05-22 LAB — CBC WITH DIFFERENTIAL/PLATELET
Absolute Monocytes: 918 cells/uL (ref 200–950)
Basophils Absolute: 54 cells/uL (ref 0–200)
Basophils Relative: 0.4 %
Eosinophils Absolute: 297 cells/uL (ref 15–500)
Eosinophils Relative: 2.2 %
HCT: 45.5 % — ABNORMAL HIGH (ref 35.0–45.0)
Hemoglobin: 14.8 g/dL (ref 11.7–15.5)
Lymphs Abs: 2835 cells/uL (ref 850–3900)
MCH: 31.6 pg (ref 27.0–33.0)
MCHC: 32.5 g/dL (ref 32.0–36.0)
MCV: 97 fL (ref 80.0–100.0)
MPV: 10.8 fL (ref 7.5–12.5)
Monocytes Relative: 6.8 %
Neutro Abs: 9396 cells/uL — ABNORMAL HIGH (ref 1500–7800)
Neutrophils Relative %: 69.6 %
Platelets: 412 10*3/uL — ABNORMAL HIGH (ref 140–400)
RBC: 4.69 10*6/uL (ref 3.80–5.10)
RDW: 11.9 % (ref 11.0–15.0)
Total Lymphocyte: 21 %
WBC: 13.5 10*3/uL — ABNORMAL HIGH (ref 3.8–10.8)

## 2021-05-22 LAB — COMPREHENSIVE METABOLIC PANEL
AG Ratio: 1.1 (calc) (ref 1.0–2.5)
ALT: 39 U/L — ABNORMAL HIGH (ref 6–29)
AST: 27 U/L (ref 10–35)
Albumin: 4.1 g/dL (ref 3.6–5.1)
Alkaline phosphatase (APISO): 38 U/L (ref 37–153)
BUN/Creatinine Ratio: 10 (calc) (ref 6–22)
BUN: 14 mg/dL (ref 7–25)
CO2: 25 mmol/L (ref 20–32)
Calcium: 9.8 mg/dL (ref 8.6–10.4)
Chloride: 102 mmol/L (ref 98–110)
Creat: 1.43 mg/dL — ABNORMAL HIGH (ref 0.50–1.05)
Globulin: 3.8 g/dL (calc) — ABNORMAL HIGH (ref 1.9–3.7)
Glucose, Bld: 138 mg/dL — ABNORMAL HIGH (ref 65–99)
Potassium: 4.4 mmol/L (ref 3.5–5.3)
Sodium: 137 mmol/L (ref 135–146)
Total Bilirubin: 0.5 mg/dL (ref 0.2–1.2)
Total Protein: 7.9 g/dL (ref 6.1–8.1)

## 2021-05-22 LAB — LIPID PANEL
Cholesterol: 136 mg/dL (ref <200)
HDL: 35 mg/dL — ABNORMAL LOW (ref >=50)
LDL Cholesterol (Calc): 78 mg/dL (calc)
Non-HDL Cholesterol (Calc): 101 mg/dL (calc) (ref <130)
Total CHOL/HDL Ratio: 3.9 (calc) (ref <5.0)
Triglycerides: 126 mg/dL (ref <150)

## 2021-05-24 ENCOUNTER — Ambulatory Visit: Payer: Medicare Other

## 2021-05-24 ENCOUNTER — Other Ambulatory Visit: Payer: Self-pay

## 2021-05-24 DIAGNOSIS — N19 Unspecified kidney failure: Secondary | ICD-10-CM | POA: Diagnosis not present

## 2021-05-24 DIAGNOSIS — R0781 Pleurodynia: Secondary | ICD-10-CM | POA: Diagnosis not present

## 2021-05-25 ENCOUNTER — Ambulatory Visit (INDEPENDENT_AMBULATORY_CARE_PROVIDER_SITE_OTHER): Payer: Medicare Other | Admitting: Physician Assistant

## 2021-05-25 ENCOUNTER — Encounter: Payer: Self-pay | Admitting: Physician Assistant

## 2021-05-25 DIAGNOSIS — L97522 Non-pressure chronic ulcer of other part of left foot with fat layer exposed: Secondary | ICD-10-CM

## 2021-05-25 NOTE — Progress Notes (Signed)
Office Visit Note   Patient: Katrina Wolfe           Date of Birth: 1966/03/31           MRN: [id number] Visit Date: 05/25/2021              Requested by: Wenda Low, MD [address] [place],  [place] [address] PCP: Wenda Low, MD  No chief complaint on file.     HPI: Katrina Wolfe is a pleasant 55 year old woman with a fixed cavovarus foot deformity who comes in periodically for trimming on the ulcer at the base of the fifth metatarsal.  She does wear a Higher education careers adviser.  Assessment & Plan: Visit Diagnoses: No diagnosis found.  Plan: Patient will follow-up in 3 weeks sooner if any concerns  Follow-Up Instructions: No follow-ups on file.   Ortho Exam  Patient is alert, oriented, no adenopathy, well-dressed, normal affect, normal respiratory effort. She has a large amount of macerated and callused skin around the ulcer which measures about a centimeter in diameter does not probe to bone nor has tunneling.  No surrounding erythema or ascending cellulitis.  After verbal consent areas were debrided to a healthy bleeding tissue which was touched with silver nitrate clean dry dressing was applied after hemostasis was achieved  Imaging: No results found. No images are attached to the encounter.  Labs: Lab Results  Component Value Date   REPTSTATUS 04/08/2019 FINAL 04/04/2019   GRAMSTAIN  04/04/2019    FEW WBC PRESENT,BOTH PMN AND MONONUCLEAR NO ORGANISMS SEEN    CULT  04/04/2019    FEW PSEUDOMONAS AERUGINOSA FEW SERRATIA MARCESCENS FEW STAPHYLOCOCCUS AUREUS CRITICAL RESULT CALLED TO, READ BACK BY AND VERIFIED WITH: RN Suzan Garibaldi [phone] FCP NO ANAEROBES ISOLATED Performed at [facility], [place] [address] [place], [place] [address]    LABORGA PSEUDOMONAS AERUGINOSA 04/04/2019   LABORGA SERRATIA MARCESCENS 04/04/2019   LABORGA STAPHYLOCOCCUS AUREUS 04/04/2019     Lab Results  Component Value Date   ALBUMIN 3.9 03/01/2019   ALBUMIN 3.5 (L) 05/24/2017    ALBUMIN 4.2 11/23/2016    No results found for: MG No results found for: VD25OH  No results found for: PREALBUMIN CBC EXTENDED Latest Ref Rng & Units 05/19/2021 02/22/2021 07/06/2020  WBC 3.8 - 10.8 Thousand/uL 13.5(H) 12.5(H) 10.8  RBC 3.80 - 5.10 Million/uL 4.69 4.11 4.32  HGB 11.7 - 15.5 g/dL 14.8 13.5 13.5  HCT 35.0 - 45.0 % 45.5(H) 38.9 39.7  PLT 140 - 400 Thousand/uL 412(H) 330 370  NEUTROABS 1,500 - 7,800 cells/uL 9,396(H) 9,088(H) 7,538  LYMPHSABS 850 - 3,900 cells/uL 2,835 2,488 2,473     There is no height or weight on file to calculate BMI.  Orders:  No orders of the defined types were placed in this encounter.  No orders of the defined types were placed in this encounter.    Procedures: No procedures performed  Clinical Data: No additional findings.  ROS:  All other systems negative, except as noted in the HPI. Review of Systems  Objective: Vital Signs: There were no vitals taken for this visit.  Specialty Comments:  No specialty comments available.  PMFS History: Patient Active Problem List   Diagnosis Date Noted  . Osteomyelitis of foot ([place]) 04/04/2019  . Hardware complicating wound infection ([place])   . Subacute osteomyelitis, left ankle and foot ([place])   . Open displaced fracture of fifth metatarsal bone of left foot   . Healthcare maintenance 04/03/2019  .  HIV disease ([place]) 10/27/2015  . DM type 2 (diabetes mellitus, type 2) ([place]) 10/27/2015  . Hyperlipidemia 10/27/2015  . Essential hypertension 10/27/2015  . Severe vulvar dysplasia, histologically confirmed 10/15/2015   Past Medical History:  Diagnosis Date  . Abnormal uterine bleeding (AUB)   . Arthritis    knees, fingers  . Asthma    as child, no problems in 20 yrs  . Cancer ([place])   . CKD (chronic kidney disease), stage II   . Congenital cardiomegaly    per pt has always been told since childhood and siblings   . GERD (gastroesophageal reflux disease)   . History of genital warts    . HIV (human immunodeficiency virus infection) ([place])    [facility]-- DR Carlyle Basques  . Hypertension   . Intermittent palpitations    mild-- no meds  . Legally blind in left eye, as defined in [place]    trauma as child  . Tuberculosis    ? 2005 or 2007  . Type 2 diabetes mellitus (HCC)    Type II   . Uterine fibroid   . VIN III (vulvar intraepithelial neoplasia III)    and Verrucoid lesion on the mons  . Wears glasses     Family History  Problem Relation Age of Onset  . Hypertension Mother   . Diabetes Father   . Cancer Brother   . Breast cancer Cousin     Past Surgical History:  Procedure Laterality Date  . CESAREAN SECTION  2001  &  1984  . CO2 LASER APPLICATION N/A 13/0/8657   Procedure: CO2 LASER APPLICATION AND WIDE VOCAL EXCSION OF LESION ON MONS PUBIS;  Surgeon: Marti Sleigh, MD;  Location: [facility];  Service: Gynecology;  Laterality: N/A;   CASE CANCELLED   . CO2 LASER APPLICATION N/A 8/46/9629   Procedure: CO2 LASER OF VULVAR;  Surgeon: Marti Sleigh, MD;  Location[facility];  Service: Gynecology;  Laterality: N/A;  . CORNEAL TRANSPLANT Left 2006   failed  . D & C HYSTEROSCOPY /  RESECTION FIBROID  2004  . DILATATION & CURETTAGE/HYSTEROSCOPY WITH MYOSURE N/A 10/01/2019   Procedure: DILATATION & CURETTAGE/HYSTEROSCOPY WITH MYOSURE and HYDROTHERMAL ABLATION;  Surgeon: Thurnell Lose, MD;  Location: [facility];  Service: Gynecology;  Laterality: N/A;  HTA rep will be here. Confirmed on 09/25/19 CS  . HARDWARE REMOVAL Left 04/04/2019   Procedure: EXCISION BONE AND REMOVE DEEP HARDWARE LEFT 5TH METATARSAL;  Surgeon: Newt Minion, MD;  Location: [facility];  Service: Orthopedics;  Laterality: Left;  . KNEE ARTHROSCOPY W/ ACL RECONSTRUCTION Bilateral 2008  . ORIF TOE FRACTURE Left 08/02/2017   Procedure: OPEN REDUCTION INTERNAL FIXATION (ORIF) FIFTH METATARSAL (TOE) BASE FRACTURE;   Surgeon: Wylene Simmer, MD;  Location: [facility];  Service: Orthopedics;  Laterality: Left;  Marland Kitchen VULVECTOMY N/A 01/07/2016   Procedure: WIDE LOCAL EXCISION;  Surgeon: Marti Sleigh, MD;  Location: [facility];  Service: Gynecology;  Laterality: N/A;  mons pubis as site   Social History   Occupational History  . Not on file  Tobacco Use  . Smoking status: Never Smoker  . Smokeless tobacco: Never Used  Vaping Use  . Vaping Use: Never used  Substance and Sexual Activity  . Alcohol use: No    Alcohol/week: 0.0 standard drinks  . Drug use: No  . Sexual activity: Yes    Partners: Male    Birth control/protection:  None, Condom    Comment: declined condoms 03/07/21

## 2021-05-27 DIAGNOSIS — N182 Chronic kidney disease, stage 2 (mild): Secondary | ICD-10-CM | POA: Diagnosis not present

## 2021-05-27 DIAGNOSIS — E1140 Type 2 diabetes mellitus with diabetic neuropathy, unspecified: Secondary | ICD-10-CM | POA: Diagnosis not present

## 2021-05-27 DIAGNOSIS — E11621 Type 2 diabetes mellitus with foot ulcer: Secondary | ICD-10-CM | POA: Diagnosis not present

## 2021-05-27 DIAGNOSIS — E1142 Type 2 diabetes mellitus with diabetic polyneuropathy: Secondary | ICD-10-CM | POA: Diagnosis not present

## 2021-05-27 DIAGNOSIS — I10 Essential (primary) hypertension: Secondary | ICD-10-CM | POA: Diagnosis not present

## 2021-05-27 DIAGNOSIS — E1165 Type 2 diabetes mellitus with hyperglycemia: Secondary | ICD-10-CM | POA: Diagnosis not present

## 2021-05-27 DIAGNOSIS — E7800 Pure hypercholesterolemia, unspecified: Secondary | ICD-10-CM | POA: Diagnosis not present

## 2021-06-01 ENCOUNTER — Other Ambulatory Visit: Payer: Self-pay | Admitting: Internal Medicine

## 2021-06-01 ENCOUNTER — Ambulatory Visit (INDEPENDENT_AMBULATORY_CARE_PROVIDER_SITE_OTHER): Payer: Medicare Other | Admitting: Internal Medicine

## 2021-06-01 ENCOUNTER — Other Ambulatory Visit: Payer: Self-pay

## 2021-06-01 ENCOUNTER — Ambulatory Visit
Admission: RE | Admit: 2021-06-01 | Discharge: 2021-06-01 | Disposition: A | Discharge status: Home / Self Care | Payer: Medicare Other | Source: Ambulatory Visit | Attending: Internal Medicine | Admitting: Internal Medicine

## 2021-06-01 VITALS — BP 144/84 | HR 87 | Temp 98.9°F

## 2021-06-01 DIAGNOSIS — R0781 Pleurodynia: Secondary | ICD-10-CM

## 2021-06-01 DIAGNOSIS — R109 Unspecified abdominal pain: Secondary | ICD-10-CM

## 2021-06-01 DIAGNOSIS — L97509 Non-pressure chronic ulcer of other part of unspecified foot with unspecified severity: Secondary | ICD-10-CM

## 2021-06-01 DIAGNOSIS — Z79899 Other long term (current) drug therapy: Secondary | ICD-10-CM

## 2021-06-01 DIAGNOSIS — E11621 Type 2 diabetes mellitus with foot ulcer: Secondary | ICD-10-CM

## 2021-06-01 DIAGNOSIS — N183 Chronic kidney disease, stage 3 unspecified: Secondary | ICD-10-CM | POA: Diagnosis not present

## 2021-06-01 DIAGNOSIS — B20 Human immunodeficiency virus [HIV] disease: Secondary | ICD-10-CM

## 2021-06-01 DIAGNOSIS — E119 Type 2 diabetes mellitus without complications: Secondary | ICD-10-CM | POA: Diagnosis not present

## 2021-06-01 IMAGING — DX DG RIBS W/ CHEST 3+V*R*
3 series · 3 of 3 positions shown · non-contrast
Comparison: [DATE]

CLINICAL DATA: Right rib pain.

EXAM:
RIGHT RIBS AND CHEST - 3+ VIEW

[dg ribs unilateral w/chest right (1 of 3)]
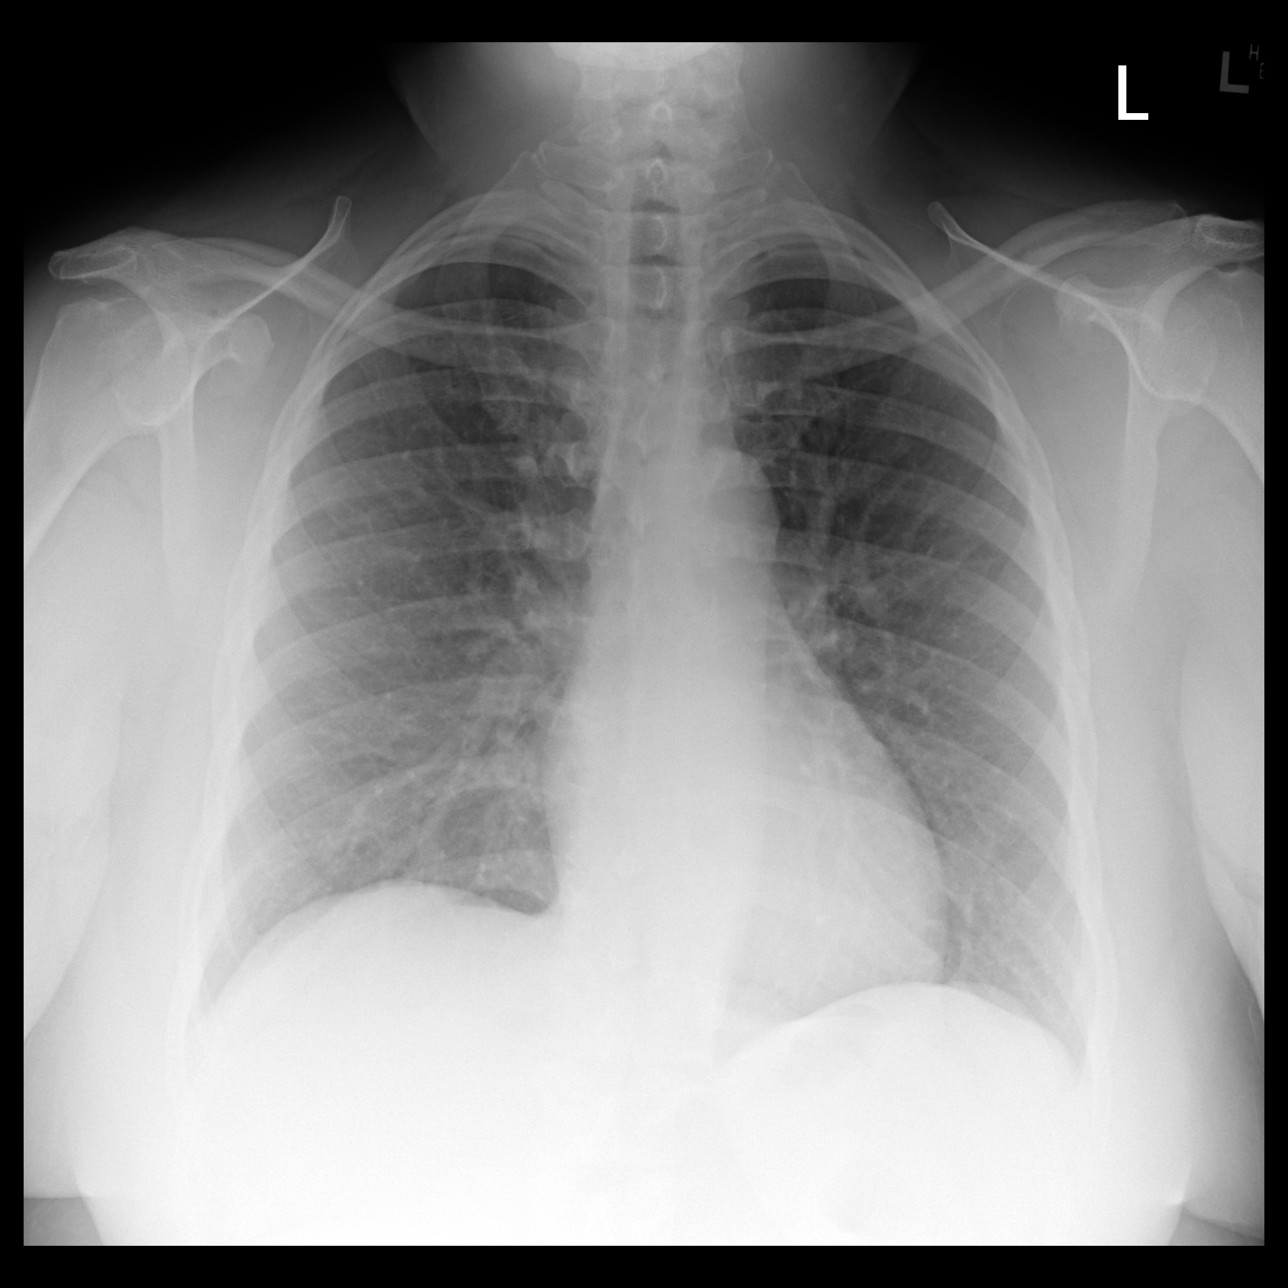

[dg ribs unilateral w/chest right (2 of 3)]
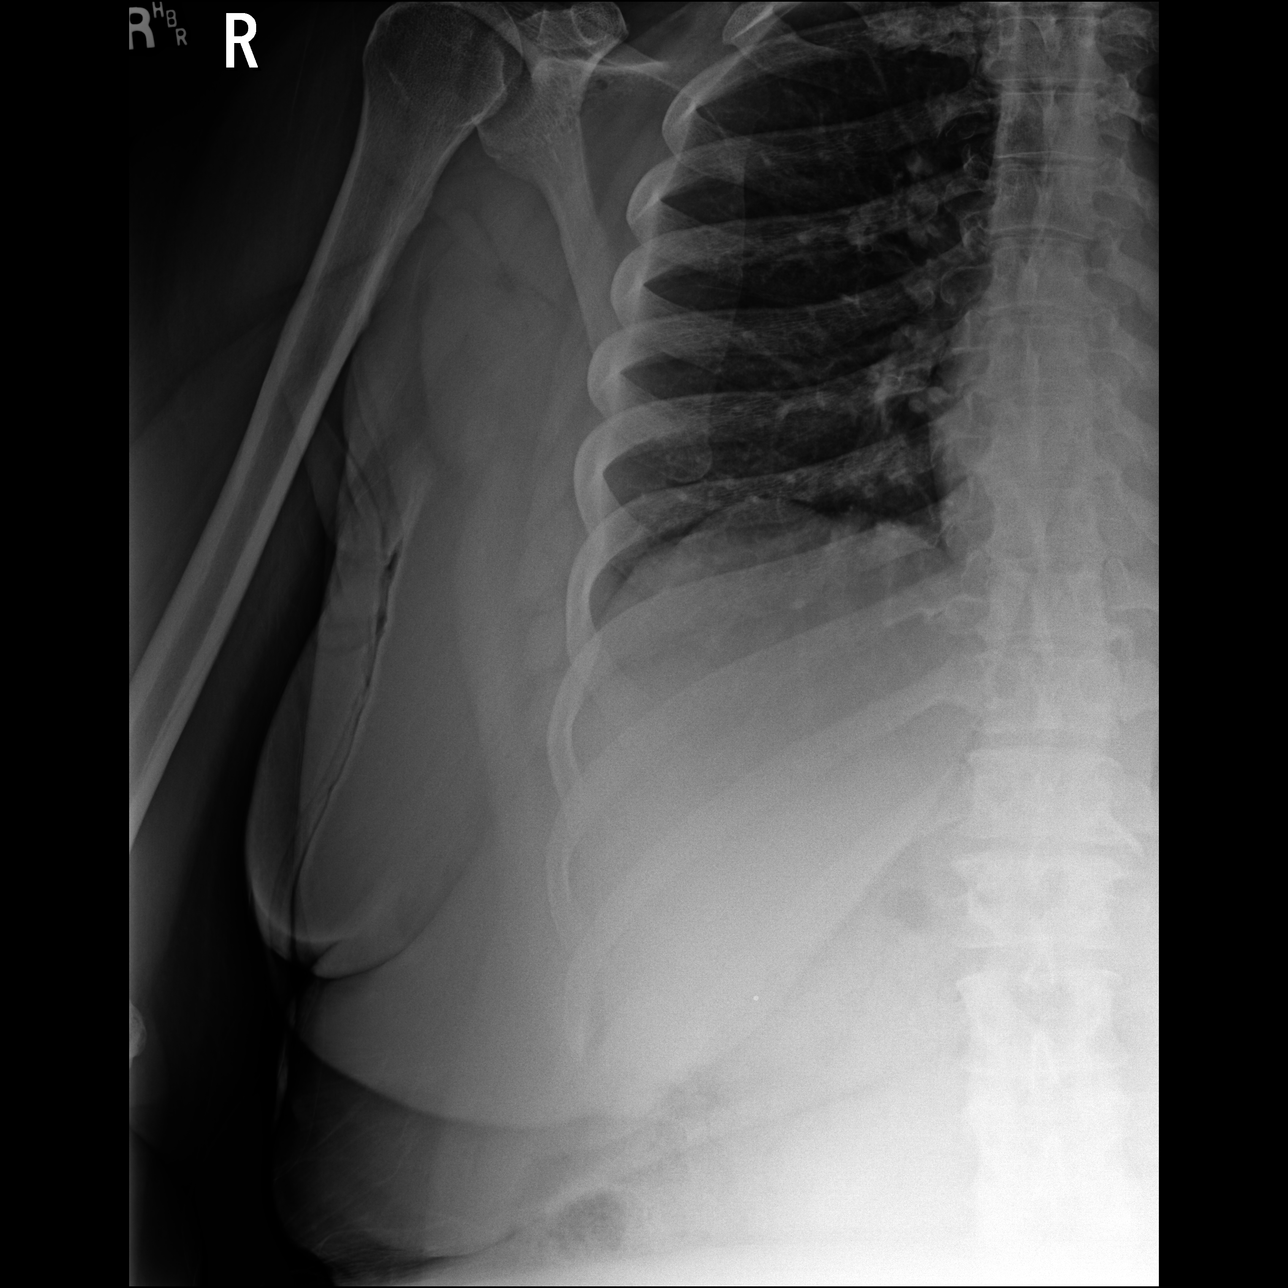

[dg ribs unilateral w/chest right (3 of 3)]
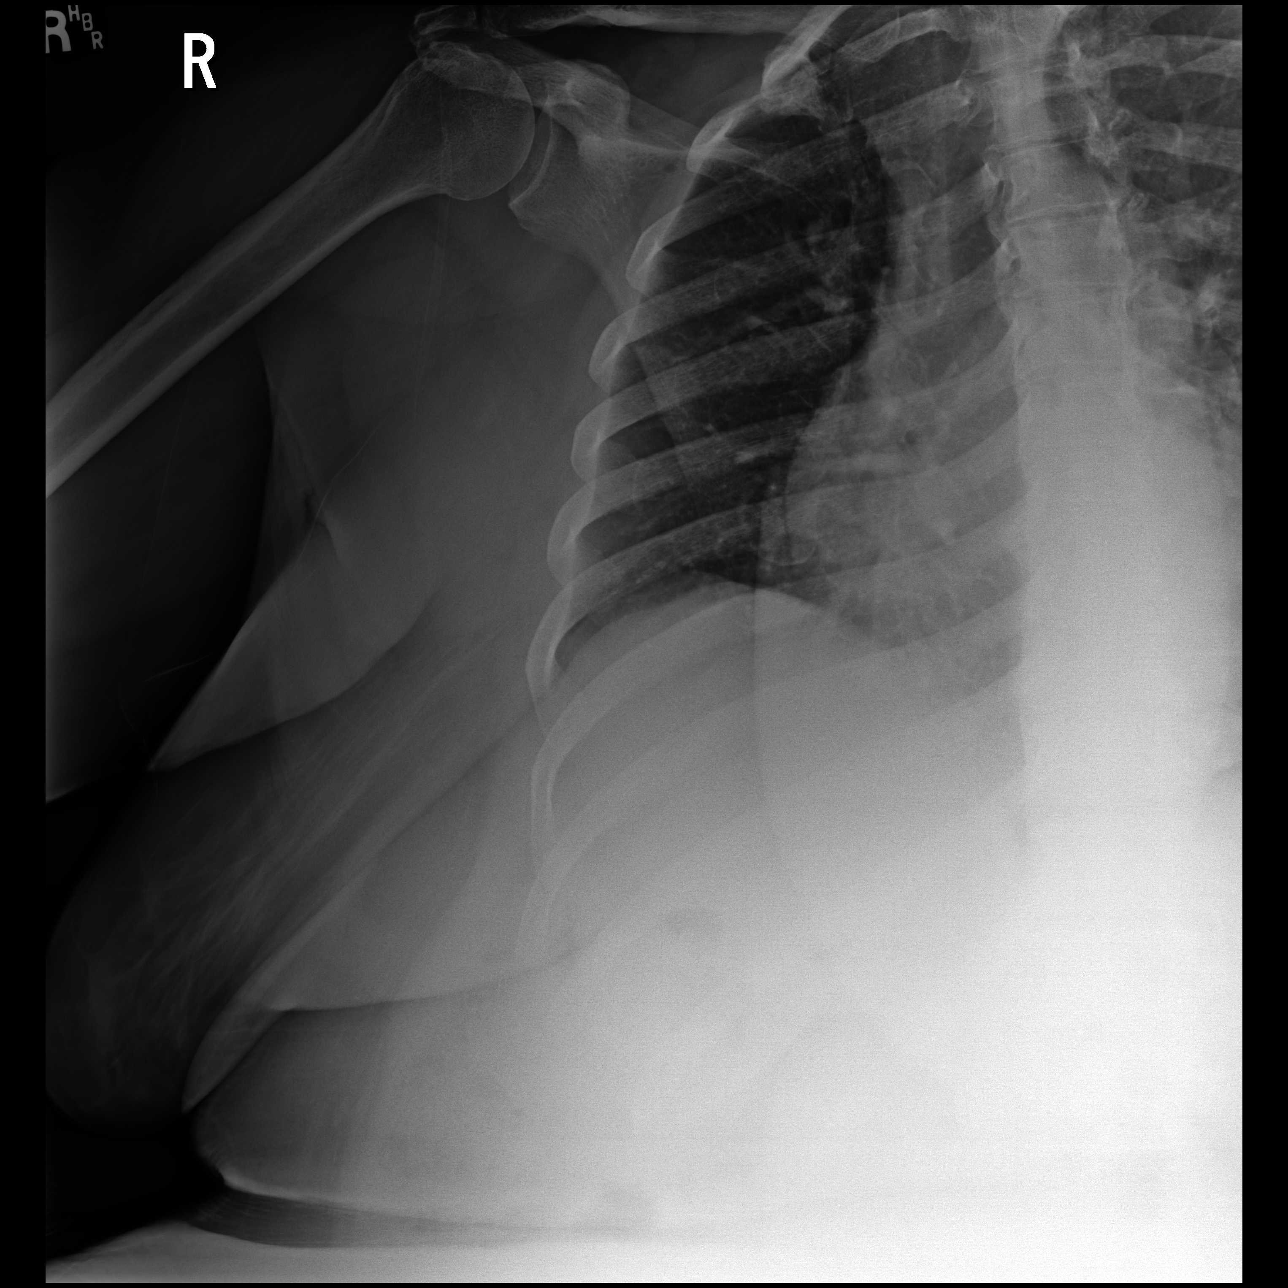

[3 of 3 positions shown; findings below may reference images not displayed]

FINDINGS: Both lungs are clear. Negative for a pneumothorax. Heart size is
within normal limits. Marker was placed in the right lower chest.
Negative for a displaced right rib fracture.
IMPRESSION: No acute abnormality.

## 2021-06-01 MED ORDER — METHOCARBAMOL 500 MG PO TABS: 500.0000 mg | ORAL_TABLET | Freq: Three times a day (TID) | ORAL | 0 refills | Status: DC | PRN

## 2021-06-01 NOTE — Progress Notes (Signed)
RFV: follow up for hiv disease  Patient ID: Katrina Wolfe, female   DOB: [birth year], 55 y.o.   MRN: [id number]  HPI Katrina Wolfe is a 55yo F with HIV disease, DM, left DFU, HLD, left eye blindness, she reports that she just was seen by pcp, and she reprots her kidney funciton improving. She is meeting with Diabetic dietician education since Hgba1c now 8.7 (increasing). Not missing doses biktarvy  Outpatient Encounter Medications as of 06/01/2021  Medication Sig   aspirin EC 81 MG tablet Take 1 tablet (81 mg total) by mouth 2 (two) times daily.   atorvastatin (LIPITOR) 10 MG tablet Take 10 mg by mouth every morning.    atropine 1 % ophthalmic solution Place 1 drop into both eyes 2 (two) times daily.   bictegravir-emtricitabine-tenofovir AF (BIKTARVY) 50-200-25 MG TABS tablet TAKE ONE TABLET BY MOUTH every evening   calcium carbonate (TUMS - DOSED IN MG ELEMENTAL CALCIUM) 500 MG chewable tablet Chew 1 tablet by mouth as needed for indigestion or heartburn.   Calcium Carbonate-Vitamin D 600-200 MG-UNIT TABS 1 tablet with a meal   cloNIDine (CATAPRES) 0.3 MG tablet Take 0.3 mg by mouth 2 (two) times daily.   Coconut Oil 1000 MG CAPS 2 capsules   erythromycin ophthalmic ointment SMARTSIG:1 CENTIMETER(S) In Eye(s) 3 Times Daily   ferrous sulfate 325 (65 FE) MG EC tablet 1 tablet   gabapentin (NEURONTIN) 100 MG capsule Take 100 mg by mouth 3 (three) times daily.   glimepiride (AMARYL) 2 MG tablet Take 2 mg by mouth daily.   HUMALOG MIX 75/25 KWIKPEN (75-25) 100 UNIT/ML Kwikpen Inject 56 Units into the skin 2 (two) times daily.    HYDROcodone-acetaminophen (NORCO/VICODIN) 5-325 MG tablet Take 1 tablet by mouth every 6 (six) hours as needed for severe pain.   Insulin Syringe-Needle U-100 (INSULIN SYRINGE 1CC/31GX5/16") 31G X 5/16" 1 ML MISC 1 Units by Does not apply route 2 (two) times daily.   JARDIANCE 10 MG TABS tablet Take 10 mg by mouth every morning.   Lancets (ONETOUCH ULTRASOFT) lancets     lisinopril-hydrochlorothiazide (PRINZIDE,ZESTORETIC) 20-25 MG tablet Take 1 tablet by mouth daily.   ONE TOUCH ULTRA TEST test strip    TRULICITY 9.62 EZ/6.6QH SOPN Inject into the skin. Taking 3.3   VICTOZA 18 MG/3ML SOPN Inject 1.8 mg into the skin every morning.   [DISCONTINUED] amoxicillin (AMOXIL) 500 MG capsule Take 1 capsule (500 mg total) by mouth 3 (three) times daily.   No facility-administered encounter medications on file as of 06/01/2021.     Patient Active Problem List   Diagnosis Date Noted   Osteomyelitis of foot ([place]) 47/65/4650   Hardware complicating wound infection ([place])    Subacute osteomyelitis, left ankle and foot ([place])    Open displaced fracture of fifth metatarsal bone of left foot    Healthcare maintenance 04/03/2019   HIV disease ([place]) 10/27/2015   DM type 2 (diabetes mellitus, type 2) ([place]) 10/27/2015   Hyperlipidemia 10/27/2015   Essential hypertension 10/27/2015   Severe vulvar dysplasia, histologically confirmed 10/15/2015     Health Maintenance Due  Topic Date Due   HEMOGLOBIN A1C  Never done   COVID-19 Vaccine (1) Never done   Pneumococcal Vaccine 2-41 Years old (1 - PCV) Never done   FOOT EXAM  Never done   OPHTHALMOLOGY EXAM  Never done   Zoster Vaccines- Shingrix (1 of 2) Never done   COLONOSCOPY (Pts 45-17yrs Insurance coverage will need to be confirmed)  Never done  Review of Systems She is noticing some right chest wall discomfort no trauma to area Physical Exam   BP (!) 144/84   Pulse 87   Temp 98.9 F (37.2 C)   SpO2 96%   Firmness to right chestwall - in subcutaneous. No skin changes Physical Exam  Constitutional:  oriented to person, place, and time. appears well-developed and well-nourished. No distress.  HENT: [place]/AT, PERRLA, no scleral icterus Mouth/Throat: Oropharynx is clear and moist. No oropharyngeal exudate.  Cardiovascular: Normal rate, regular rhythm and normal heart sounds. Exam reveals no gallop and no friction  rub.  No murmur heard.  Pulmonary/Chest: Effort normal and breath sounds normal. No respiratory distress.  has no wheezes.  Neck = supple, no nuchal rigidity Abdominal: Soft. Bowel sounds are normal.  exhibits no distension. There is no tenderness.  Lymphadenopathy: no cervical adenopathy. No axillary adenopathy Neurological: alert and oriented to person, place, and time.  Skin: Skin is warm and dry. No rash noted. No erythema.  Psychiatric: a normal mood and affect.  behavior is normal.   Lab Results  Component Value Date   CD4TCELL 25 (L) 05/19/2021   Lab Results  Component Value Date   CD4TABS 638 05/19/2021   CD4TABS 654 02/22/2021   CD4TABS 660 07/06/2020   Lab Results  Component Value Date   HIV1RNAQUANT Not Detected 05/19/2021   Lab Results  Component Value Date   HEPBSAB NEG 10/13/2015   Lab Results  Component Value Date   LABRPR NON-REACTIVE 02/22/2021    CBC Lab Results  Component Value Date   WBC 13.5 (H) 05/19/2021   RBC 4.69 05/19/2021   HGB 14.8 05/19/2021   HCT 45.5 (H) 05/19/2021   PLT 412 (H) 05/19/2021   MCV 97.0 05/19/2021   MCH 31.6 05/19/2021   MCHC 32.5 05/19/2021   RDW 11.9 05/19/2021   LYMPHSABS 2,835 05/19/2021   MONOABS 1.0 03/01/2019   EOSABS 297 05/19/2021    BMET Lab Results  Component Value Date   NA 137 05/19/2021   K 4.4 05/19/2021   CL 102 05/19/2021   CO2 25 05/19/2021   GLUCOSE 138 (H) 05/19/2021   BUN 14 05/19/2021   CREATININE 1.43 (H) 05/19/2021   CALCIUM 9.8 05/19/2021   GFRNONAA 42 (L) 02/22/2021   GFRAA 48 (L) 02/22/2021      Assessment and Plan  Musculoskeletal -muscle cramping right posterior chest wall= also observed that she uses right arm mostly for wheelchair use. Susepct this is musculoskeletal strain. Xray per my read does not suggest any fractures. Will do a short course robaxin.  Hiv disease = continue on current regimen well control  Ckd 3 = stable, no need for any change in regimen  IDDM =  encouraged better control of DM so that dfu heals (also sees dr Sharol Given for management)

## 2021-06-15 ENCOUNTER — Other Ambulatory Visit: Payer: Self-pay | Admitting: Family

## 2021-06-15 DIAGNOSIS — B20 Human immunodeficiency virus [HIV] disease: Secondary | ICD-10-CM

## 2021-06-16 DIAGNOSIS — I10 Essential (primary) hypertension: Secondary | ICD-10-CM | POA: Diagnosis not present

## 2021-06-19 ENCOUNTER — Other Ambulatory Visit: Payer: Self-pay | Admitting: Internal Medicine

## 2021-06-19 DIAGNOSIS — B20 Human immunodeficiency virus [HIV] disease: Secondary | ICD-10-CM

## 2021-06-22 ENCOUNTER — Ambulatory Visit: Payer: Medicare Other | Admitting: Physician Assistant

## 2021-06-22 DIAGNOSIS — E119 Type 2 diabetes mellitus without complications: Secondary | ICD-10-CM | POA: Diagnosis not present

## 2021-06-24 ENCOUNTER — Inpatient Hospital Stay (HOSPITAL_COMMUNITY): Payer: Medicare Other

## 2021-06-24 ENCOUNTER — Encounter (HOSPITAL_COMMUNITY): Payer: Self-pay | Admitting: Emergency Medicine

## 2021-06-24 ENCOUNTER — Ambulatory Visit: Payer: Self-pay

## 2021-06-24 ENCOUNTER — Encounter: Payer: Self-pay | Admitting: Physician Assistant

## 2021-06-24 ENCOUNTER — Inpatient Hospital Stay (HOSPITAL_COMMUNITY)
Admission: EM | Admit: 2021-06-24 | Discharge: 2021-07-04 | DRG: 629 | Disposition: A | Discharge status: Home Health | Payer: Medicare Other | Attending: Internal Medicine | Admitting: Internal Medicine

## 2021-06-24 ENCOUNTER — Other Ambulatory Visit: Payer: Self-pay

## 2021-06-24 ENCOUNTER — Emergency Department (HOSPITAL_COMMUNITY): Payer: Medicare Other

## 2021-06-24 ENCOUNTER — Ambulatory Visit (INDEPENDENT_AMBULATORY_CARE_PROVIDER_SITE_OTHER): Payer: Medicare Other | Admitting: Physician Assistant

## 2021-06-24 DIAGNOSIS — Z6841 Body Mass Index (BMI) 40.0 and over, adult: Secondary | ICD-10-CM | POA: Diagnosis not present

## 2021-06-24 DIAGNOSIS — Z28310 Unvaccinated for covid-19: Secondary | ICD-10-CM

## 2021-06-24 DIAGNOSIS — E1121 Type 2 diabetes mellitus with diabetic nephropathy: Secondary | ICD-10-CM | POA: Diagnosis not present

## 2021-06-24 DIAGNOSIS — Z21 Asymptomatic human immunodeficiency virus [HIV] infection status: Secondary | ICD-10-CM | POA: Diagnosis not present

## 2021-06-24 DIAGNOSIS — E1165 Type 2 diabetes mellitus with hyperglycemia: Secondary | ICD-10-CM | POA: Diagnosis present

## 2021-06-24 DIAGNOSIS — L089 Local infection of the skin and subcutaneous tissue, unspecified: Secondary | ICD-10-CM | POA: Diagnosis not present

## 2021-06-24 DIAGNOSIS — E0821 Diabetes mellitus due to underlying condition with diabetic nephropathy: Secondary | ICD-10-CM | POA: Diagnosis not present

## 2021-06-24 DIAGNOSIS — M21962 Unspecified acquired deformity of left lower leg: Secondary | ICD-10-CM | POA: Diagnosis present

## 2021-06-24 DIAGNOSIS — E11621 Type 2 diabetes mellitus with foot ulcer: Secondary | ICD-10-CM | POA: Diagnosis not present

## 2021-06-24 DIAGNOSIS — Z7984 Long term (current) use of oral hypoglycemic drugs: Secondary | ICD-10-CM | POA: Diagnosis not present

## 2021-06-24 DIAGNOSIS — L97404 Non-pressure chronic ulcer of unspecified heel and midfoot with necrosis of bone: Secondary | ICD-10-CM | POA: Diagnosis not present

## 2021-06-24 DIAGNOSIS — B20 Human immunodeficiency virus [HIV] disease: Secondary | ICD-10-CM | POA: Diagnosis not present

## 2021-06-24 DIAGNOSIS — L98499 Non-pressure chronic ulcer of skin of other sites with unspecified severity: Secondary | ICD-10-CM | POA: Diagnosis not present

## 2021-06-24 DIAGNOSIS — L97529 Non-pressure chronic ulcer of other part of left foot with unspecified severity: Secondary | ICD-10-CM | POA: Diagnosis not present

## 2021-06-24 DIAGNOSIS — E669 Obesity, unspecified: Secondary | ICD-10-CM | POA: Diagnosis present

## 2021-06-24 DIAGNOSIS — R52 Pain, unspecified: Secondary | ICD-10-CM | POA: Diagnosis present

## 2021-06-24 DIAGNOSIS — M79672 Pain in left foot: Secondary | ICD-10-CM | POA: Diagnosis not present

## 2021-06-24 DIAGNOSIS — Z20822 Contact with and (suspected) exposure to covid-19: Secondary | ICD-10-CM | POA: Diagnosis present

## 2021-06-24 DIAGNOSIS — N179 Acute kidney failure, unspecified: Secondary | ICD-10-CM | POA: Diagnosis not present

## 2021-06-24 DIAGNOSIS — R7989 Other specified abnormal findings of blood chemistry: Secondary | ICD-10-CM | POA: Diagnosis not present

## 2021-06-24 DIAGNOSIS — Z794 Long term (current) use of insulin: Secondary | ICD-10-CM | POA: Diagnosis not present

## 2021-06-24 DIAGNOSIS — M86672 Other chronic osteomyelitis, left ankle and foot: Secondary | ICD-10-CM | POA: Diagnosis not present

## 2021-06-24 DIAGNOSIS — N189 Chronic kidney disease, unspecified: Secondary | ICD-10-CM | POA: Diagnosis not present

## 2021-06-24 DIAGNOSIS — L97414 Non-pressure chronic ulcer of right heel and midfoot with necrosis of bone: Secondary | ICD-10-CM

## 2021-06-24 DIAGNOSIS — Z91013 Allergy to seafood: Secondary | ICD-10-CM | POA: Diagnosis not present

## 2021-06-24 DIAGNOSIS — E7800 Pure hypercholesterolemia, unspecified: Secondary | ICD-10-CM | POA: Diagnosis not present

## 2021-06-24 DIAGNOSIS — M86472 Chronic osteomyelitis with draining sinus, left ankle and foot: Secondary | ICD-10-CM | POA: Diagnosis not present

## 2021-06-24 DIAGNOSIS — E1169 Type 2 diabetes mellitus with other specified complication: Secondary | ICD-10-CM

## 2021-06-24 DIAGNOSIS — H53002 Unspecified amblyopia, left eye: Secondary | ICD-10-CM | POA: Diagnosis not present

## 2021-06-24 DIAGNOSIS — M869 Osteomyelitis, unspecified: Secondary | ICD-10-CM | POA: Diagnosis not present

## 2021-06-24 DIAGNOSIS — N183 Chronic kidney disease, stage 3 unspecified: Secondary | ICD-10-CM | POA: Diagnosis present

## 2021-06-24 DIAGNOSIS — E118 Type 2 diabetes mellitus with unspecified complications: Secondary | ICD-10-CM | POA: Diagnosis not present

## 2021-06-24 DIAGNOSIS — R609 Edema, unspecified: Secondary | ICD-10-CM | POA: Diagnosis present

## 2021-06-24 DIAGNOSIS — I129 Hypertensive chronic kidney disease with stage 1 through stage 4 chronic kidney disease, or unspecified chronic kidney disease: Secondary | ICD-10-CM | POA: Diagnosis not present

## 2021-06-24 DIAGNOSIS — IMO0002 Reserved for concepts with insufficient information to code with codable children: Secondary | ICD-10-CM | POA: Diagnosis present

## 2021-06-24 DIAGNOSIS — E1122 Type 2 diabetes mellitus with diabetic chronic kidney disease: Secondary | ICD-10-CM | POA: Diagnosis present

## 2021-06-24 DIAGNOSIS — E785 Hyperlipidemia, unspecified: Secondary | ICD-10-CM | POA: Diagnosis present

## 2021-06-24 DIAGNOSIS — Z882 Allergy status to sulfonamides status: Secondary | ICD-10-CM

## 2021-06-24 DIAGNOSIS — E11622 Type 2 diabetes mellitus with other skin ulcer: Secondary | ICD-10-CM | POA: Diagnosis not present

## 2021-06-24 DIAGNOSIS — I10 Essential (primary) hypertension: Secondary | ICD-10-CM | POA: Diagnosis present

## 2021-06-24 DIAGNOSIS — Z79899 Other long term (current) drug therapy: Secondary | ICD-10-CM | POA: Diagnosis not present

## 2021-06-24 DIAGNOSIS — N1831 Chronic kidney disease, stage 3a: Secondary | ICD-10-CM | POA: Diagnosis present

## 2021-06-24 DIAGNOSIS — K8020 Calculus of gallbladder without cholecystitis without obstruction: Secondary | ICD-10-CM | POA: Diagnosis not present

## 2021-06-24 DIAGNOSIS — M868X7 Other osteomyelitis, ankle and foot: Secondary | ICD-10-CM | POA: Diagnosis not present

## 2021-06-24 DIAGNOSIS — S91302A Unspecified open wound, left foot, initial encounter: Secondary | ICD-10-CM | POA: Diagnosis not present

## 2021-06-24 DIAGNOSIS — M86171 Other acute osteomyelitis, right ankle and foot: Secondary | ICD-10-CM | POA: Diagnosis not present

## 2021-06-24 DIAGNOSIS — E08621 Diabetes mellitus due to underlying condition with foot ulcer: Secondary | ICD-10-CM

## 2021-06-24 DIAGNOSIS — L0390 Cellulitis, unspecified: Secondary | ICD-10-CM | POA: Diagnosis not present

## 2021-06-24 DIAGNOSIS — L97424 Non-pressure chronic ulcer of left heel and midfoot with necrosis of bone: Secondary | ICD-10-CM | POA: Diagnosis not present

## 2021-06-24 DIAGNOSIS — S92352K Displaced fracture of fifth metatarsal bone, left foot, subsequent encounter for fracture with nonunion: Secondary | ICD-10-CM | POA: Diagnosis not present

## 2021-06-24 DIAGNOSIS — E119 Type 2 diabetes mellitus without complications: Secondary | ICD-10-CM | POA: Insufficient documentation

## 2021-06-24 HISTORY — DX: Type 2 diabetes mellitus without complications: E11.9

## 2021-06-24 HISTORY — DX: Pure hypercholesterolemia, unspecified: E78.00

## 2021-06-24 HISTORY — DX: Human immunodeficiency virus (HIV) disease: B20

## 2021-06-24 HISTORY — DX: Disorder of kidney and ureter, unspecified: N28.9

## 2021-06-24 HISTORY — DX: Essential (primary) hypertension: I10

## 2021-06-24 LAB — COMPREHENSIVE METABOLIC PANEL
ALT: 145 U/L — ABNORMAL HIGH (ref 0–44)
AST: 81 U/L — ABNORMAL HIGH (ref 15–41)
Albumin: 3.5 g/dL (ref 3.5–5.0)
Alkaline Phosphatase: 38 U/L (ref 38–126)
Anion gap: 12 (ref 5–15)
BUN: 22 mg/dL — ABNORMAL HIGH (ref 6–20)
CO2: 19 mmol/L — ABNORMAL LOW (ref 22–32)
Calcium: 9.1 mg/dL (ref 8.9–10.3)
Chloride: 106 mmol/L (ref 98–111)
Creatinine, Ser: 1.8 mg/dL — ABNORMAL HIGH (ref 0.44–1.00)
GFR, Estimated: 33 mL/min — ABNORMAL LOW (ref >60)
Glucose, Bld: 83 mg/dL (ref 70–99)
Potassium: 3.7 mmol/L (ref 3.5–5.1)
Sodium: 137 mmol/L (ref 135–145)
Total Bilirubin: 0.8 mg/dL (ref 0.3–1.2)
Total Protein: 8 g/dL (ref 6.5–8.1)

## 2021-06-24 LAB — GLUCOSE, CAPILLARY: Glucose-Capillary: 131 mg/dL — ABNORMAL HIGH (ref 70–99)

## 2021-06-24 LAB — CBC WITH DIFFERENTIAL/PLATELET
Abs Immature Granulocytes: 0.06 10*3/uL (ref 0.00–0.07)
Basophils Absolute: 0.1 10*3/uL (ref 0.0–0.1)
Basophils Relative: 0 %
Eosinophils Absolute: 0.1 10*3/uL (ref 0.0–0.5)
Eosinophils Relative: 1 %
HCT: 49.7 % — ABNORMAL HIGH (ref 36.0–46.0)
Hemoglobin: 15.9 g/dL — ABNORMAL HIGH (ref 12.0–15.0)
Immature Granulocytes: 0 %
Lymphocytes Relative: 21 %
Lymphs Abs: 3.4 10*3/uL (ref 0.7–4.0)
MCH: 30.9 pg (ref 26.0–34.0)
MCHC: 32 g/dL (ref 30.0–36.0)
MCV: 96.5 fL (ref 80.0–100.0)
Monocytes Absolute: 1 10*3/uL (ref 0.1–1.0)
Monocytes Relative: 6 %
Neutro Abs: 11.9 10*3/uL — ABNORMAL HIGH (ref 1.7–7.7)
Neutrophils Relative %: 72 %
Platelets: 555 10*3/uL — ABNORMAL HIGH (ref 150–400)
RBC: 5.15 MIL/uL — ABNORMAL HIGH (ref 3.87–5.11)
RDW: 13.4 % (ref 11.5–15.5)
WBC: 16.5 10*3/uL — ABNORMAL HIGH (ref 4.0–10.5)
nRBC: 0 % (ref 0.0–0.2)

## 2021-06-24 LAB — RESP PANEL BY RT-PCR (FLU A&B, COVID) ARPGX2
Influenza A by PCR: NEGATIVE
Influenza B by PCR: NEGATIVE
SARS Coronavirus 2 by RT PCR: NEGATIVE

## 2021-06-24 LAB — HEMOGLOBIN A1C
Hgb A1c MFr Bld: 8.8 % — ABNORMAL HIGH (ref 4.8–5.6)
Mean Plasma Glucose: 205.86 mg/dL

## 2021-06-24 LAB — LACTIC ACID, PLASMA: Lactic Acid, Venous: 1.3 mmol/L (ref 0.5–1.9)

## 2021-06-24 IMAGING — DX DG FOOT COMPLETE 3+V*L*
4 series · 4 of 4 positions shown · non-contrast
Comparison: None.

CLINICAL DATA: Open wound to the lateral aspect of the left foot

EXAM:
LEFT FOOT - COMPLETE 3+ VIEW

[x foot ap left]
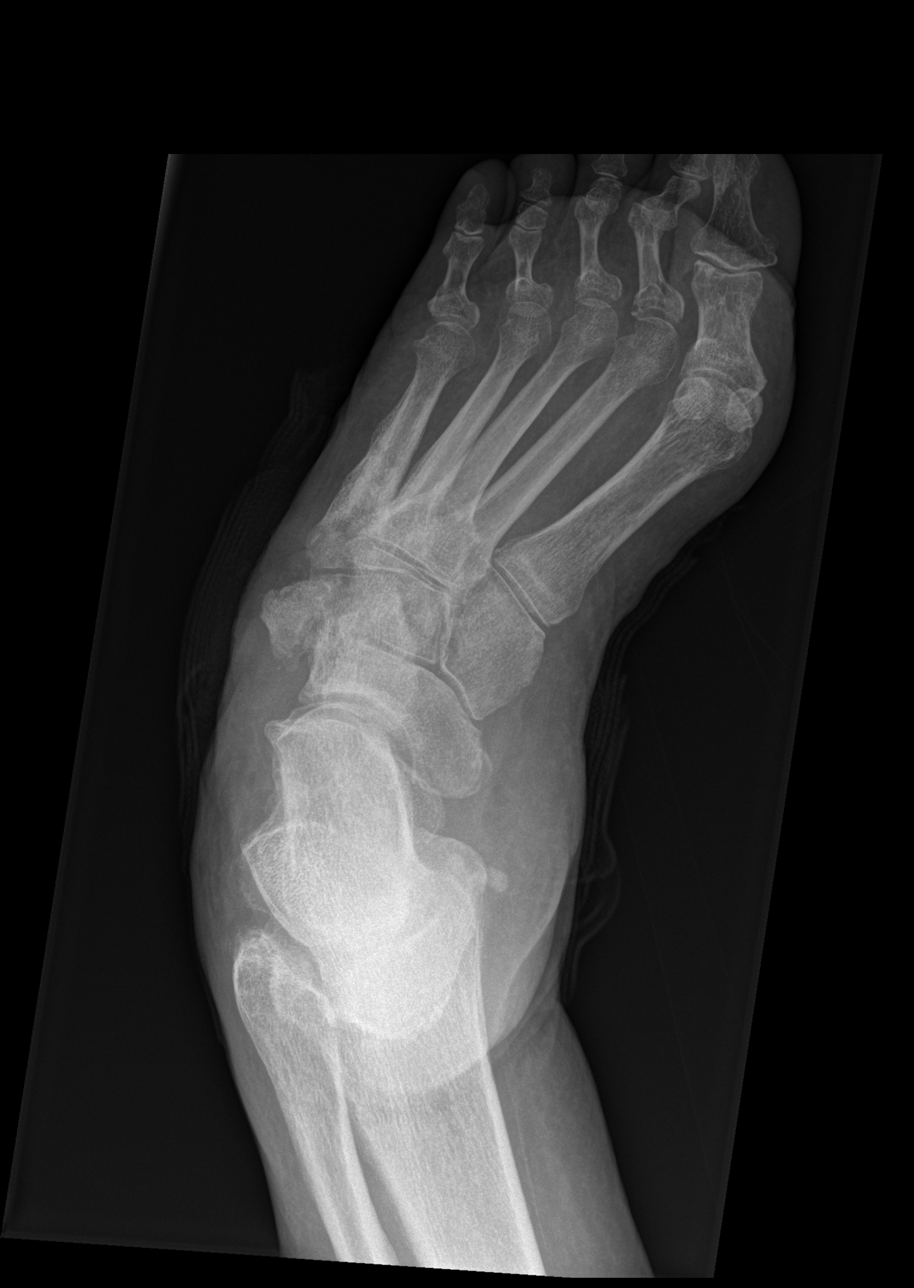

[x foot obl left]
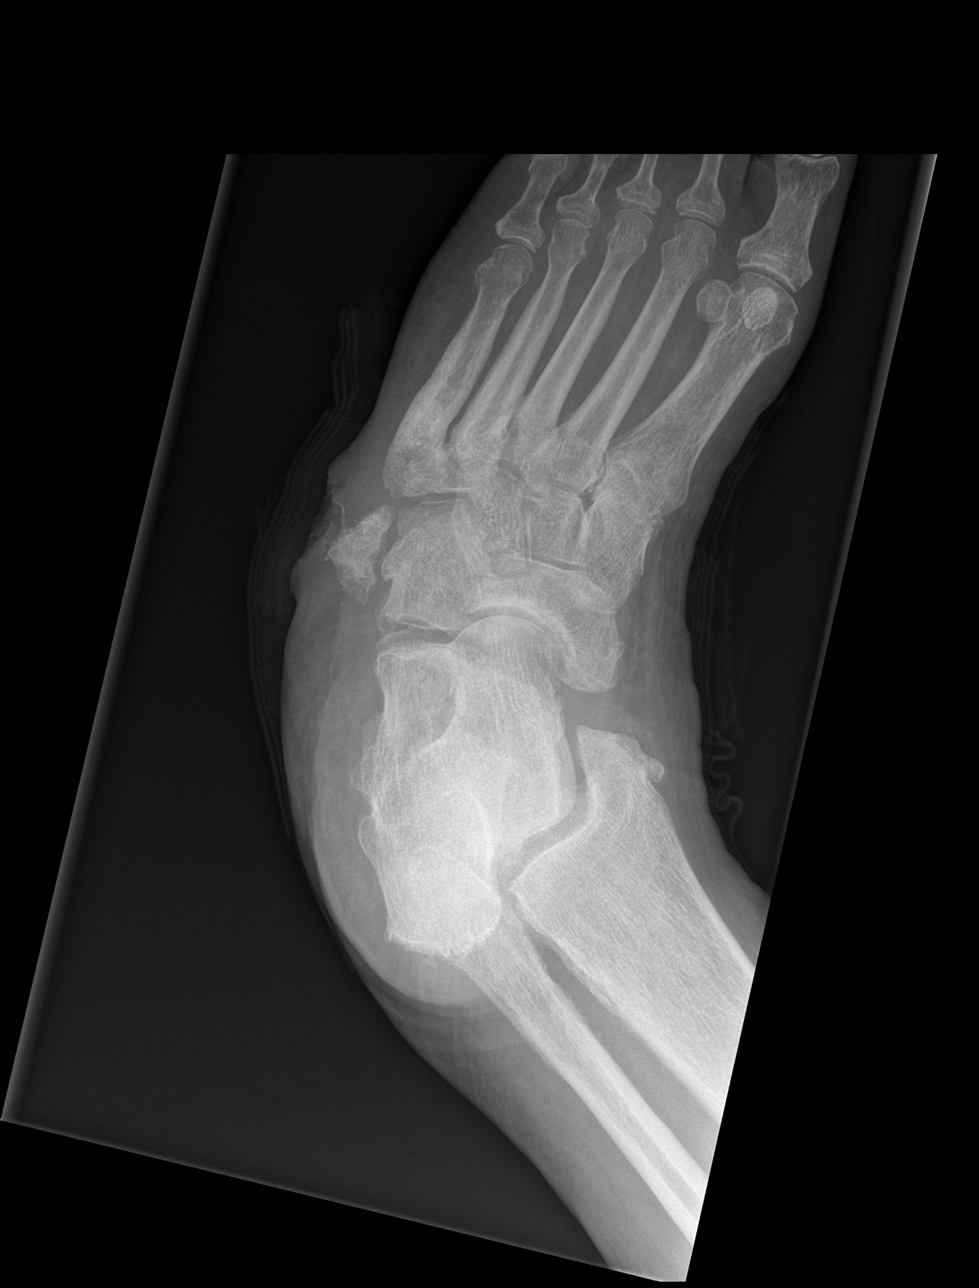

[x foot lat left (1 of 2)]
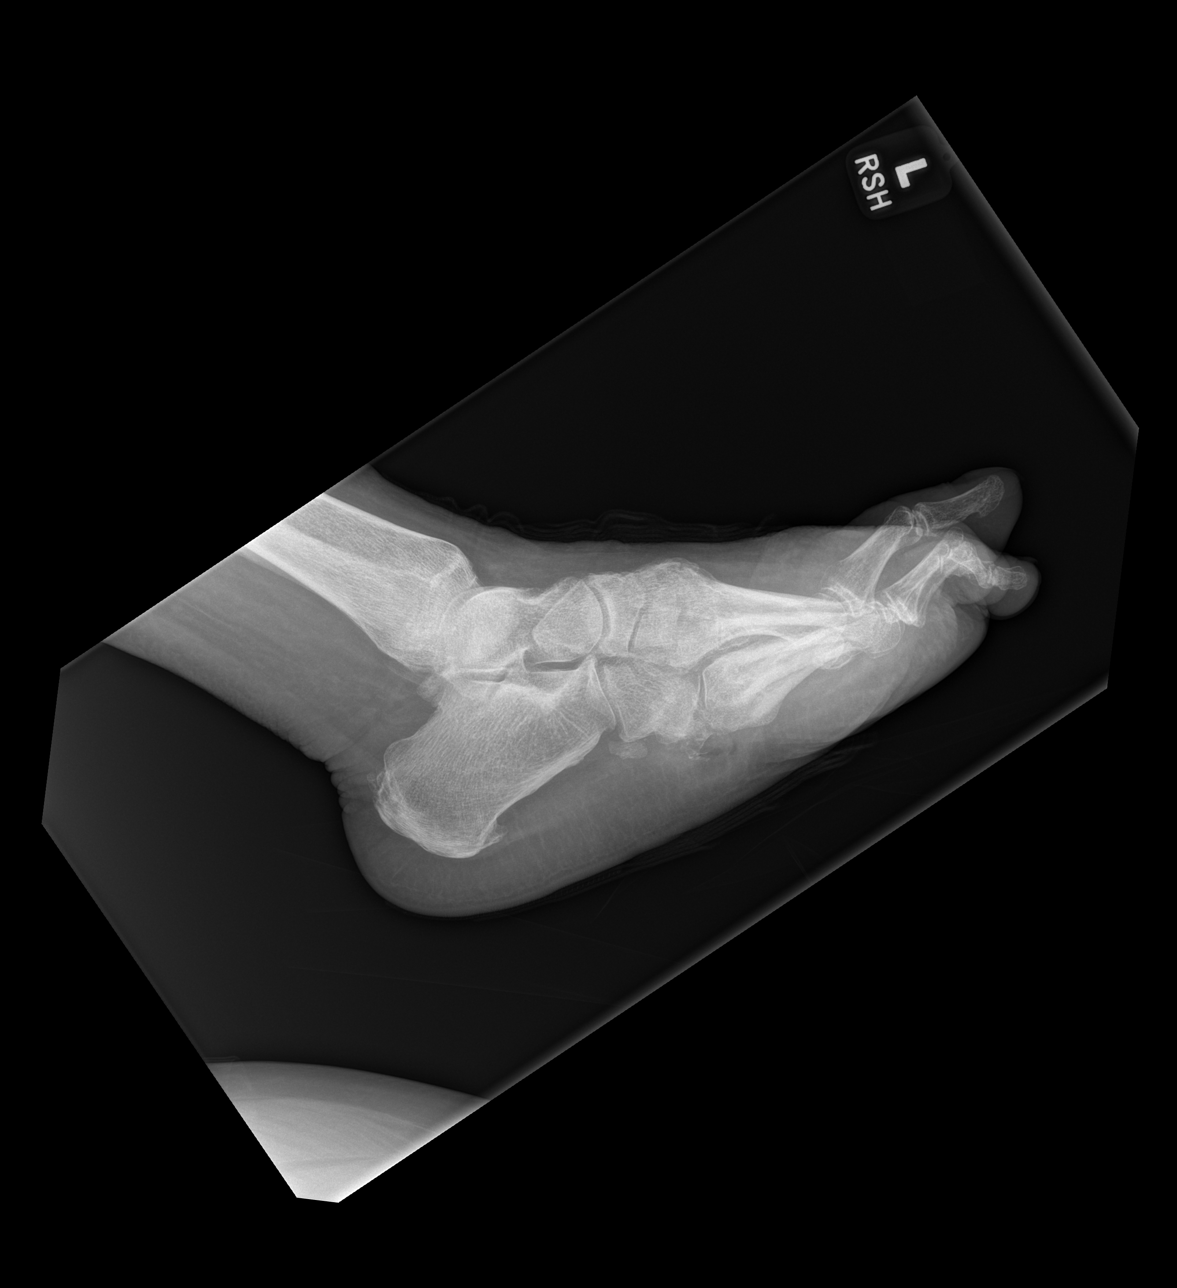

[x foot lat left (2 of 2)]
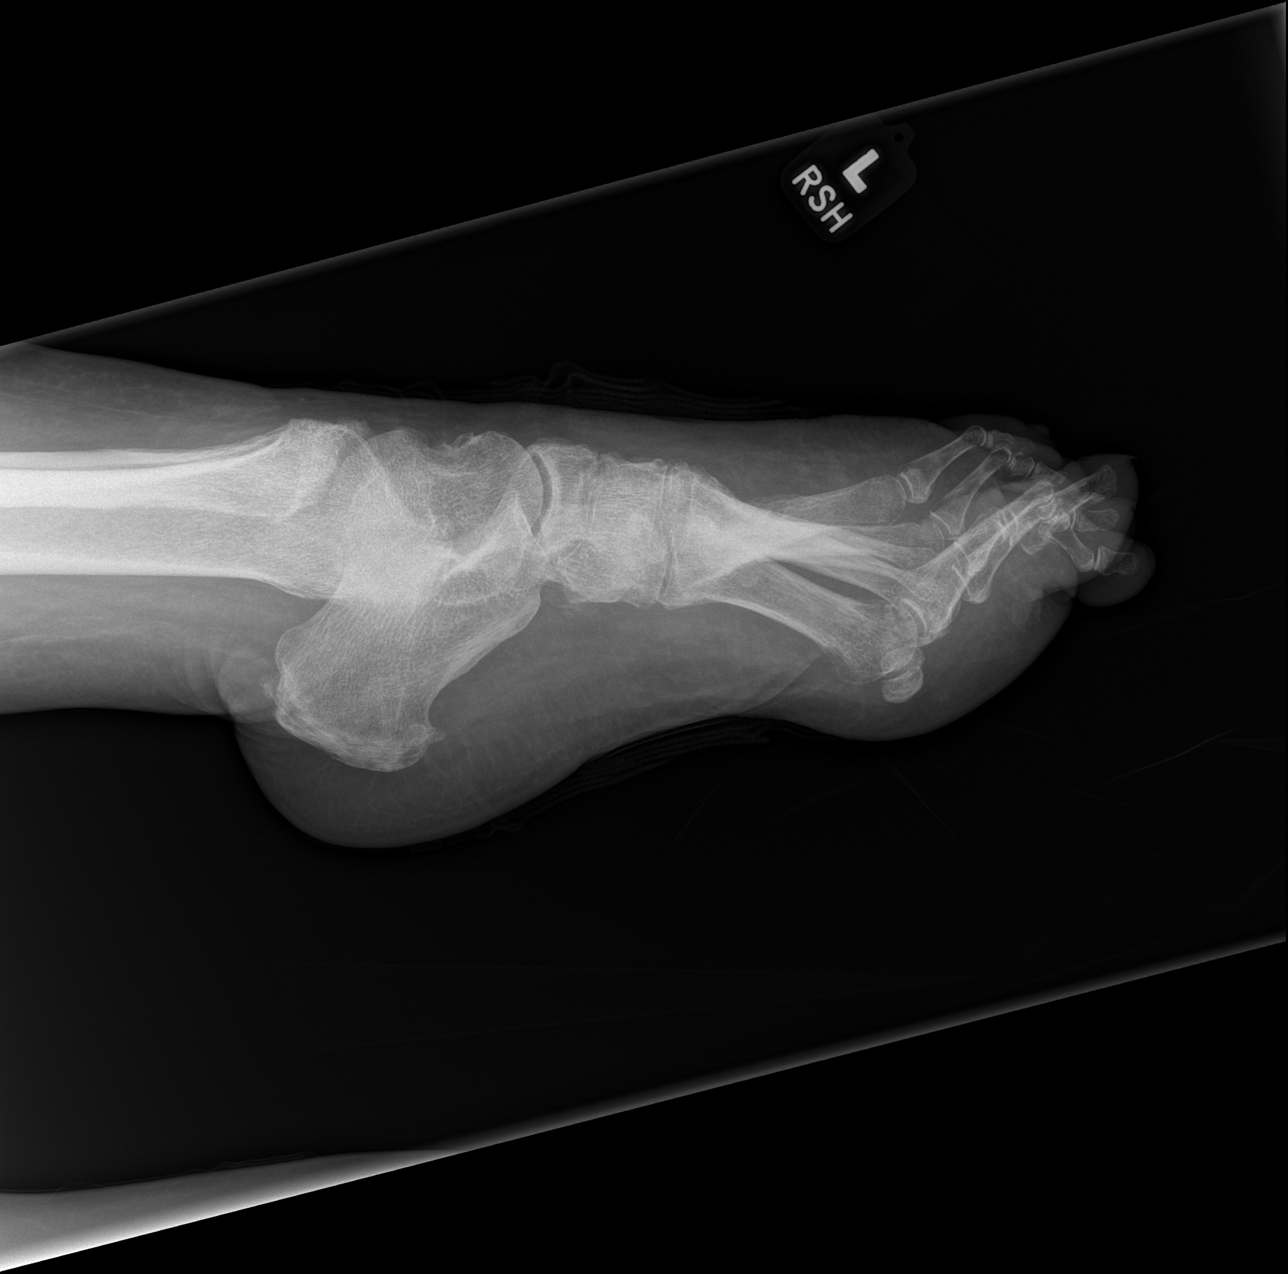

[4 of 4 positions shown; findings below may reference images not displayed]

FINDINGS: Lateral ulcer with underlying calcification. A donor site for the
calcification is not seen and may be heterotopic bone. Sclerosis and
mature periosteal reaction affecting the proximal fifth metatarsal.
Lateral arch collapse based on the lateral view, although
nonweightbearing. Generalized osteopenia.
IMPRESSION: Lateral soft tissue ulcer with immediately subjacent bone or
heterotopic ossification. There is also abnormal sclerosis of the
fifth metatarsal suggesting chronic osteomyelitis.

## 2021-06-24 MED ORDER — AMLODIPINE BESYLATE 5 MG PO TABS
5.0000 mg | ORAL_TABLET | Freq: Every morning | ORAL | Status: DC
  Administered 2021-06-25 – 2021-07-04 (×8): 5 mg via ORAL
  Filled 2021-06-24 (×9): qty 1

## 2021-06-24 MED ORDER — NORETHINDRONE ACETATE 5 MG PO TABS
5.0000 mg | ORAL_TABLET | Freq: Two times a day (BID) | ORAL | Status: DC
  Administered 2021-06-24 – 2021-07-04 (×20): 5 mg via ORAL
  Filled 2021-06-24 (×21): qty 1

## 2021-06-24 MED ORDER — ENOXAPARIN SODIUM 40 MG/0.4ML IJ SOSY
40.0000 mg | PREFILLED_SYRINGE | INTRAMUSCULAR | Status: DC
  Administered 2021-06-24 – 2021-06-29 (×6): 40 mg via SUBCUTANEOUS
  Filled 2021-06-24 (×6): qty 0.4

## 2021-06-24 MED ORDER — HYDROCODONE-ACETAMINOPHEN 5-325 MG PO TABS: 1.0000 | ORAL_TABLET | ORAL | Status: DC | PRN

## 2021-06-24 MED ORDER — ONDANSETRON HCL 4 MG/2ML IJ SOLN
4.0000 mg | Freq: Four times a day (QID) | INTRAMUSCULAR | Status: DC | PRN
  Administered 2021-06-27: 4 mg via INTRAVENOUS

## 2021-06-24 MED ORDER — VANCOMYCIN HCL 1750 MG/350ML IV SOLN
1750.0000 mg | Freq: Once | INTRAVENOUS | Status: AC
  Administered 2021-06-24: 1750 mg via INTRAVENOUS
  Filled 2021-06-24: qty 350

## 2021-06-24 MED ORDER — INSULIN ASPART 100 UNIT/ML IJ SOLN
0.0000 [IU] | Freq: Three times a day (TID) | INTRAMUSCULAR | Status: DC
  Administered 2021-06-25 – 2021-06-28 (×3): 2 [IU] via SUBCUTANEOUS
  Administered 2021-06-28: 5 [IU] via SUBCUTANEOUS
  Administered 2021-06-29: 1 [IU] via SUBCUTANEOUS
  Administered 2021-06-29 – 2021-06-30 (×4): 3 [IU] via SUBCUTANEOUS
  Administered 2021-07-01 (×2): 2 [IU] via SUBCUTANEOUS

## 2021-06-24 MED ORDER — INSULIN LISPRO PROT & LISPRO (75-25 MIX) 100 UNIT/ML KWIKPEN: 60.0000 [IU] | PEN_INJECTOR | Freq: Two times a day (BID) | SUBCUTANEOUS | Status: DC

## 2021-06-24 MED ORDER — GABAPENTIN 100 MG PO CAPS
100.0000 mg | ORAL_CAPSULE | Freq: Four times a day (QID) | ORAL | Status: DC
  Administered 2021-06-24 – 2021-07-04 (×39): 100 mg via ORAL
  Filled 2021-06-24 (×39): qty 1

## 2021-06-24 MED ORDER — SODIUM CHLORIDE 0.9 % IV SOLN
2.0000 g | INTRAVENOUS | Status: DC
  Administered 2021-06-24 – 2021-07-04 (×10): 2 g via INTRAVENOUS
  Filled 2021-06-24 (×10): qty 20

## 2021-06-24 MED ORDER — HYDROCHLOROTHIAZIDE 25 MG PO TABS
25.0000 mg | ORAL_TABLET | Freq: Every day | ORAL | Status: DC
  Administered 2021-06-25 – 2021-06-29 (×5): 25 mg via ORAL
  Filled 2021-06-24 (×5): qty 1

## 2021-06-24 MED ORDER — PIPERACILLIN-TAZOBACTAM 3.375 G IVPB 30 MIN
3.3750 g | Freq: Once | INTRAVENOUS | Status: AC
  Administered 2021-06-24: 3.375 g via INTRAVENOUS
  Filled 2021-06-24: qty 50

## 2021-06-24 MED ORDER — ONDANSETRON HCL 4 MG PO TABS: 4.0000 mg | ORAL_TABLET | Freq: Four times a day (QID) | ORAL | Status: DC | PRN

## 2021-06-24 MED ORDER — VANCOMYCIN HCL IN DEXTROSE 1-5 GM/200ML-% IV SOLN: 1000.0000 mg | INTRAVENOUS | Status: DC

## 2021-06-24 MED ORDER — LISINOPRIL 20 MG PO TABS
20.0000 mg | ORAL_TABLET | Freq: Every day | ORAL | Status: DC
  Administered 2021-06-25 – 2021-06-29 (×5): 20 mg via ORAL
  Filled 2021-06-24 (×5): qty 1

## 2021-06-24 MED ORDER — ATORVASTATIN CALCIUM 10 MG PO TABS
10.0000 mg | ORAL_TABLET | Freq: Every day | ORAL | Status: DC
  Administered 2021-06-25 – 2021-06-28 (×4): 10 mg via ORAL
  Filled 2021-06-24 (×4): qty 1

## 2021-06-24 MED ORDER — BICTEGRAVIR-EMTRICITAB-TENOFOV 50-200-25 MG PO TABS
1.0000 | ORAL_TABLET | Freq: Every day | ORAL | Status: DC
  Administered 2021-06-24 – 2021-07-03 (×10): 1 via ORAL
  Filled 2021-06-24 (×11): qty 1

## 2021-06-24 MED ORDER — INSULIN ASPART PROT & ASPART (70-30 MIX) 100 UNIT/ML ~~LOC~~ SUSP
60.0000 [IU] | SUBCUTANEOUS | Status: DC
  Administered 2021-06-24 – 2021-06-27 (×7): 60 [IU] via SUBCUTANEOUS
  Filled 2021-06-24: qty 10

## 2021-06-24 MED ORDER — ASPIRIN EC 81 MG PO TBEC
81.0000 mg | DELAYED_RELEASE_TABLET | Freq: Every morning | ORAL | Status: DC
  Administered 2021-06-25 – 2021-07-04 (×10): 81 mg via ORAL
  Filled 2021-06-24 (×10): qty 1

## 2021-06-24 MED ORDER — TRAMADOL HCL 50 MG PO TABS
50.0000 mg | ORAL_TABLET | ORAL | Status: DC | PRN
  Administered 2021-06-24 – 2021-06-25 (×5): 50 mg via ORAL
  Filled 2021-06-24 (×5): qty 1

## 2021-06-24 MED ORDER — DAPAGLIFLOZIN PROPANEDIOL 10 MG PO TABS
10.0000 mg | ORAL_TABLET | Freq: Every morning | ORAL | Status: DC
  Administered 2021-06-25 – 2021-06-28 (×4): 10 mg via ORAL
  Filled 2021-06-24 (×6): qty 1

## 2021-06-24 MED ORDER — LISINOPRIL-HYDROCHLOROTHIAZIDE 20-25 MG PO TABS: 1.0000 | ORAL_TABLET | Freq: Every day | ORAL | Status: DC

## 2021-06-24 MED ORDER — LACTATED RINGERS IV SOLN: INTRAVENOUS | Status: AC

## 2021-06-24 NOTE — Progress Notes (Signed)
Office Visit Note   Patient: Katrina Wolfe           Date of Birth: [birth year]           MRN: [id number] Visit Date: 06/24/2021              Requested by: Wenda Low, MD [address] [place],  [place] [address] PCP: Wenda Low, MD  Chief Complaint  Patient presents with   Left Foot - Follow-up      HPI: Patient is a pleasant 55 year old woman with a fixed left foot and ankle varus deformity.  She is followed periodically by [place] for debridement of her lateral callus.  She wears a Higher education careers adviser.  She has a history of an ORIF of the fifth metatarsal with subsequent removal of hardware secondary to infection.  She comes in early for her visit today because she states that about a week ago her foot began hurting more and had a foul odor on the outside.  The wound became more macerated.  She also reports that she has had some episodes of nausea fever and chills.  And just has not been feeling well.  She is a diabetic and she states her blood sugars have been elevated though not severely     Assessment & Plan: Visit Diagnoses:  1. Pain in left foot     Plan: Given patient's presentation of malaise and low-grade fever which was measured here at 99 7 and her history of chills in the last week have recommended referral to the emergency room for evaluation.  I discussed with the patient that she may just very well need some IV antibiotics.  She is in agreement with this plan.  Follow-Up Instructions: No follow-ups on file.   Ortho Exam  Patient is alert, oriented, no adenopathy, well-dressed, normal affect, normal respiratory effort. She has a biphasic Doppler pulse.  Fixed varus foot and ankle deformity.  No ascending cellulitis however she does have a deep macerated wound that probes close to bone.  Mild foul odor.  Imaging: XR Foot 2 Views Left  Result Date: 06/24/2021 Views of her foot were removed today.  Has a history of previous ORIF with removal of hardware  from fifth metatarsal.  There is fragmentation of the bone and some bony changes that are concerning.  Cannot rule out osteomyelitis     Labs: Lab Results  Component Value Date   REPTSTATUS 04/08/2019 FINAL 04/04/2019   GRAMSTAIN  04/04/2019    FEW WBC PRESENT,BOTH PMN AND MONONUCLEAR NO ORGANISMS SEEN    CULT  04/04/2019    FEW PSEUDOMONAS AERUGINOSA FEW SERRATIA MARCESCENS FEW STAPHYLOCOCCUS AUREUS CRITICAL RESULT CALLED TO, READ BACK BY AND VERIFIED WITH: RN Suzan Garibaldi [id number] FCP NO ANAEROBES ISOLATED Performed at [facility], [place] [address] [place], [place] [address]    LABORGA PSEUDOMONAS AERUGINOSA 04/04/2019   LABORGA SERRATIA MARCESCENS 04/04/2019   LABORGA STAPHYLOCOCCUS AUREUS 04/04/2019     Lab Results  Component Value Date   ALBUMIN 3.9 03/01/2019   ALBUMIN 3.5 (L) 05/24/2017   ALBUMIN 4.2 11/23/2016    No results found for: MG No results found for: VD25OH  No results found for: PREALBUMIN CBC EXTENDED Latest Ref Rng & Units 05/19/2021 02/22/2021 07/06/2020  WBC 3.8 - 10.8 Thousand/uL 13.5(H) 12.5(H) 10.8  RBC 3.80 - 5.10 Million/uL 4.69 4.11 4.32  HGB 11.7 - 15.5 g/dL 14.8 13.5 13.5  HCT 35.0 - 45.0 % 45.5(H) 38.9 39.7  PLT 140 - 400 Thousand/uL 412(H) 330 370  NEUTROABS 1,500 - 7,800 cells/uL 9,396(H) 9,088(H) 7,538  LYMPHSABS 850 - 3,900 cells/uL 2,835 2,488 2,473     There is no height or weight on file to calculate BMI.  Orders:  Orders Placed This Encounter  Procedures   XR Foot 2 Views Left   No orders of the defined types were placed in this encounter.    Procedures: No procedures performed  Clinical Data: No additional findings.  ROS:  All other systems negative, except as noted in the HPI. Review of Systems  Objective: Vital Signs: There were no vitals taken for this visit.  Specialty Comments:  No specialty comments available.  PMFS History: Patient Active Problem List   Diagnosis Date Noted   Osteomyelitis of  foot ([place]) 16/38/4536   Hardware complicating wound infection ([place])    Subacute osteomyelitis, left ankle and foot ([place])    Open displaced fracture of fifth metatarsal bone of left foot    Healthcare maintenance 04/03/2019   HIV disease ([place]) 10/27/2015   DM type 2 (diabetes mellitus, type 2) ([place]) 10/27/2015   Hyperlipidemia 10/27/2015   Essential hypertension 10/27/2015   Severe vulvar dysplasia, histologically confirmed 10/15/2015   Past Medical History:  Diagnosis Date   Abnormal uterine bleeding (AUB)    Arthritis    knees, fingers   Asthma    as child, no problems in 20 yrs   Cancer ([place])    CKD (chronic kidney disease), stage II    Congenital cardiomegaly    per pt has always been told since childhood and siblings    GERD (gastroesophageal reflux disease)    History of genital warts    HIV (human immunodeficiency virus infection) ([place])    DX 1998--  MONITORED BY INFECTIOUS DISEASE-- DR Carlyle Basques   Hypertension    Intermittent palpitations    mild-- no meds   Legally blind in left eye, as defined in [place]    trauma as child   Tuberculosis    ? 2005 or 2007   Type 2 diabetes mellitus ([place])    Type II    Uterine fibroid    VIN III (vulvar intraepithelial neoplasia III)    and Verrucoid lesion on the mons   Wears glasses     Family History  Problem Relation Age of Onset   Hypertension Mother    Diabetes Father    Cancer Brother    Breast cancer Cousin     Past Surgical History:  Procedure Laterality Date   CESAREAN SECTION  2001  &  4680   CO2 LASER APPLICATION N/A 32/12/2246   Procedure: CO2 LASER APPLICATION AND WIDE VOCAL EXCSION OF LESION ON MONS PUBIS;  Surgeon: Marti Sleigh, MD;  Location: [facility];  Service: Gynecology;  Laterality: N/A;   CASE CANCELLED    CO2 LASER APPLICATION N/A 2/50/0370   Procedure: CO2 LASER OF VULVAR;  Surgeon: Marti Sleigh, MD;  Location[facility];  Service: Gynecology;   Laterality: N/A;   CORNEAL TRANSPLANT Left 2006   failed   D & C HYSTEROSCOPY /  RESECTION FIBROID  2004   DILATATION & CURETTAGE/HYSTEROSCOPY WITH MYOSURE N/A 10/01/2019   Procedure: DILATATION & CURETTAGE/HYSTEROSCOPY WITH MYOSURE and HYDROTHERMAL ABLATION;  Surgeon: Thurnell Lose, MD;  Location: [facility];  Service: Gynecology;  Laterality: N/A;  HTA rep will be here. Confirmed on 09/25/19 CS   HARDWARE REMOVAL Left 04/04/2019   Procedure: EXCISION BONE  AND REMOVE DEEP HARDWARE LEFT 5TH METATARSAL;  Surgeon: Newt Minion, MD;  Location: [facility];  Service: Orthopedics;  Laterality: Left;   KNEE ARTHROSCOPY W/ ACL RECONSTRUCTION Bilateral 2008   ORIF TOE FRACTURE Left 08/02/2017   Procedure: OPEN REDUCTION INTERNAL FIXATION (ORIF) FIFTH METATARSAL (TOE) BASE FRACTURE;  Surgeon: Wylene Simmer, MD;  Location: [facility];  Service: Orthopedics;  Laterality: Left;   VULVECTOMY N/A 01/07/2016   Procedure: WIDE LOCAL EXCISION;  Surgeon: Marti Sleigh, MD;  [facility];  Service: Gynecology;  Laterality: N/A;  mons pubis as site   Social History   Occupational History   Not on file  Tobacco Use   Smoking status: Never   Smokeless tobacco: Never  Vaping Use   Vaping Use: Never used  Substance and Sexual Activity   Alcohol use: No    Alcohol/week: 0.0 standard drinks   Drug use: No   Sexual activity: Yes    Partners: Male    Birth control/protection: None, Condom    Comment: declined condoms 03/07/21

## 2021-06-24 NOTE — Progress Notes (Signed)
MRI called stated that patient could not tolerate the procedure due to pain was brought back to the unit. Arthor Captain LPN

## 2021-06-24 NOTE — ED Provider Notes (Signed)
Emergency Medicine Provider Triage Evaluation Note  Katrina Wolfe 55 y.o. female was evaluated in triage.  Pt complains of pain, wound, swelling noted to right foot.  Patient has a history of open wounds to the right foot that she has been dealing with for about 2 years.  She follows with Dr. Sharol Given with Ortho.  She went to her Ortho appointment today and noticed the wound was slightly worsening prompting her to come to the emergency department.  Patient reports that has been draining, she has been having chills at home but no fevers.  She does feel like it is more painful, more swollen.  She has a history of diabetes and states she has been compliant with her medications.  She has not missed any doses.  She states her sugars have recently been in the 200s which is abnormal for her.   Review of Systems  Positive: Foot pain, chills, wound Negative: Fevers  Physical Exam  BP 134/82   Pulse 70   Temp 98.2 F (36.8 C) (Oral)   Resp 18   Ht 5\' 4"  (1.626 m)   Wt 65.8 kg   SpO2 100%   BMI 24.89 kg/m  Gen:   Awake, no distress   HEENT:  Atraumatic  Resp:  Normal effort  Cardiac:  Normal rate  Abd:   Nondistended, nontender  MSK:   Moves extremities without difficulty  Neuro:  Speech clear   Other:   Wound noted to lateral aspect of right foot.     Medical Decision Making  Medically screening exam initiated at 11:03 AM  Appropriate orders placed.  Lorelee Cover was informed that the remainder of the evaluation will be completed by another provider, this initial triage assessment does not replace that evaluation. They are counseled that they will need to remain in the ED until the completion of their workup, including full H&P and results of any tests.  Risks of leaving the emergency department prior to completion of treatment were discussed. Patient was advised to inform ED staff if they are leaving before their treatment is complete. The patient acknowledged these risks and time was  allowed for questions.     The patient appears stable so that the remainder of the MSE may be completed by another provider.   Clinical Impression  Foot pain   Portions of this note were generated with Dragon dictation software. Dictation errors may occur despite best attempts at proofreading.     Volanda Napoleon, PA-C 06/24/21 1105    Valarie Merino, MD 06/25/21 2342

## 2021-06-24 NOTE — Progress Notes (Addendum)
Pharmacy Antibiotic Note  Katrina Wolfe is a 55 y.o. female admitted on 06/24/2021 with right foot wound. Pharmacy has been consulted for vancomycin dosing for cellulitis. Currently no concerns for sepsis.   Plan: TBW 120 kg observed and consistent with historical, Scr 1.8, CrCl 38 mL/min (using IBW), Ht 69.5 inches, BMI 38.44  Loading Dose: Vancomycin 1750 mg IV bolus Maintenance Dose: Vancomycin 1000 mg IV q24h (eAUC 463.9)  Monitor renal function, cx results, clinical progression, and LOT  Temp (24hrs), Avg:98.2 F (36.8 C), Min:98.2 F (36.8 C), Max:98.2 F (36.8 C)  Recent Labs  Lab 06/24/21 1107  WBC 16.5*  CREATININE 1.80*     Allergies  Allergen Reactions   Bactrim [Sulfamethoxazole-Trimethoprim]    Shellfish Allergy     Antimicrobials this admission: 7/8 Vancomycin >>  7/8 Zosyn 3.375 >>   Microbiology results: 7/8 BCx: Pending  Thank you for allowing pharmacy to be a part of this patient's care.  Marlowe Alt, PharmD Candidate 06/24/2021 5:03 PM

## 2021-06-24 NOTE — ED Triage Notes (Signed)
Patient sent to ED for further evaluation of two year old wound on right foot. Patient has been undergoing treatment of wound but it still has not healed. Patient alert, oriented, and in no apparent distress at this time.

## 2021-06-24 NOTE — ED Provider Notes (Signed)
[facility] EMERGENCY DEPARTMENT Provider Note   CSN: [id number] Arrival date & time: 06/24/21  1051     History Chief Complaint  Patient presents with   Foot Pain    Katrina Wolfe is a 55 y.o. female.   Foot Pain This is a recurrent problem. The current episode started more than 2 days ago. The problem occurs constantly. The problem has been gradually worsening. Pertinent negatives include no chest pain, no abdominal pain, no headaches and no shortness of breath. Nothing aggravates the symptoms. Nothing relieves the symptoms. She has tried nothing for the symptoms. The treatment provided no relief.      Past Medical History:  Diagnosis Date   Diabetes mellitus without complication (HCC)    HIV (human immunodeficiency virus infection) ([place])    Hypercholesterolemia    Hypertension    Renal disorder     There are no problems to display for this patient.      OB History   No obstetric history on file.     No family history on file.     Home Medications Prior to Admission medications   Not on File    Allergies    Bactrim [sulfamethoxazole-trimethoprim] and Shellfish allergy  Review of Systems   Review of Systems  Constitutional:  Negative for chills, diaphoresis, fatigue and fever.  HENT:  Negative for congestion.   Respiratory:  Negative for cough, chest tightness, shortness of breath and wheezing.   Cardiovascular:  Negative for chest pain and palpitations.  Gastrointestinal:  Negative for abdominal pain.  Genitourinary:  Negative for dysuria.  Musculoskeletal:  Negative for back pain, neck pain and neck stiffness.  Skin:  Positive for color change and wound.  Neurological:  Negative for headaches.  Psychiatric/Behavioral:  Negative for agitation and confusion.   All other systems reviewed and are negative.  Physical Exam Updated Vital Signs BP 126/81 (BP Location: Left Arm)   Pulse 89   Temp 98.2 F (36.8 C)   Resp 18   SpO2  100%   Physical Exam Vitals and nursing note reviewed.  Constitutional:      General: She is not in acute distress.    Appearance: She is well-developed. She is not ill-appearing, toxic-appearing or diaphoretic.  HENT:     Head: Normocephalic and atraumatic.     Mouth/Throat:     Mouth: Mucous membranes are moist.  Eyes:     Conjunctiva/sclera: Conjunctivae normal.  Cardiovascular:     Rate and Rhythm: Normal rate and regular rhythm.     Heart sounds: No murmur heard. Pulmonary:     Effort: Pulmonary effort is normal. No respiratory distress.     Breath sounds: Normal breath sounds. No wheezing, rhonchi or rales.  Chest:     Chest wall: No tenderness.  Abdominal:     Palpations: Abdomen is soft.     Tenderness: There is no abdominal tenderness. There is no guarding or rebound.  Musculoskeletal:        General: Swelling and tenderness present.     Cervical back: Neck supple.     Right lower leg: Edema present.     Left lower leg: Edema present.     Comments: Wound on left lateral foot that has foul smell and erythema and tenderness and drainage.  Intact DP and PT pulse.  Intact sensation and strength of the foot.  Exam otherwise unremarkable.  Skin:    General: Skin is warm and dry.     Capillary Refill:  Capillary refill takes less than 2 seconds.     Findings: Erythema present.  Neurological:     General: No focal deficit present.     Mental Status: She is alert.     Sensory: No sensory deficit.     Motor: No weakness.       ED Results / Procedures / Treatments   Labs (all labs ordered are listed, but only abnormal results are displayed) Labs Reviewed  COMPREHENSIVE METABOLIC PANEL - Abnormal; Notable for the following components:      Result Value   CO2 19 (*)    BUN 22 (*)    Creatinine, Ser 1.80 (*)    AST 81 (*)    ALT 145 (*)    GFR, Estimated 33 (*)    All other components within normal limits  CBC WITH DIFFERENTIAL/PLATELET - Abnormal; Notable for the  following components:   WBC 16.5 (*)    RBC 5.15 (*)    Hemoglobin 15.9 (*)    HCT 49.7 (*)    Platelets 555 (*)    Neutro Abs 11.9 (*)    All other components within normal limits  HEMOGLOBIN A1C - Abnormal; Notable for the following components:   Hgb A1c MFr Bld 8.8 (*)    All other components within normal limits  GLUCOSE, CAPILLARY - Abnormal; Notable for the following components:   Glucose-Capillary 131 (*)    All other components within normal limits  RESP PANEL BY RT-PCR (FLU A&B, COVID) ARPGX2  CULTURE, BLOOD (ROUTINE X 2)  CULTURE, BLOOD (ROUTINE X 2)  LACTIC ACID, PLASMA  COMPREHENSIVE METABOLIC PANEL  CBC    EKG None  Radiology DG Foot Complete Left  Result Date: 06/24/2021 CLINICAL DATA:  Open wound to the lateral aspect of the left foot EXAM: LEFT FOOT - COMPLETE 3+ VIEW COMPARISON:  None. FINDINGS: Lateral ulcer with underlying calcification. A donor site for the calcification is not seen and may be heterotopic bone. Sclerosis and mature periosteal reaction affecting the proximal fifth metatarsal. Lateral arch collapse based on the lateral view, although nonweightbearing. Generalized osteopenia. IMPRESSION: Lateral soft tissue ulcer with immediately subjacent bone or heterotopic ossification. There is also abnormal sclerosis of the fifth metatarsal suggesting chronic osteomyelitis. Electronically Signed   By: Monte Fantasia M.D.   On: 06/24/2021 11:48    Procedures Procedures   CRITICAL CARE Performed by: Gwenyth Allegra Sarabi Sock[place] Total critical care time: 35 minutes Critical care time was exclusive of separately billable procedures and treating other patients. Critical care was necessary to treat or prevent imminent or life-threatening deterioration. Critical care was time spent personally by me on the following activities: development of treatment plan with patient and/or surrogate as well as nursing, discussions with consultants, evaluation of patient's response to  treatment, examination of patient, obtaining history from patient or surrogate, ordering and performing treatments and interventions, ordering and review of laboratory studies, ordering and review of radiographic studies, pulse oximetry and re-evaluation of patient's condition.   Medications Ordered in ED Medications  vancomycin (VANCOCIN) IVPB 1000 mg/200 mL premix (has no administration in time range)  enoxaparin (LOVENOX) injection 40 mg (40 mg Subcutaneous Given 06/24/21 2310)  lactated ringers infusion (0 mLs Intravenous Paused 06/24/21 2346)  ondansetron (ZOFRAN) tablet 4 mg (has no administration in time range)    Or  ondansetron (ZOFRAN) injection 4 mg (has no administration in time range)  insulin aspart (novoLOG) injection 0-9 Units (has no administration in time range)  cefTRIAXone (ROCEPHIN) 2 g  in sodium chloride 0.9 % 100 mL IVPB (2 g Intravenous New Bag/Given 06/24/21 2348)  aspirin EC tablet 81 mg (has no administration in time range)  traMADol (ULTRAM) tablet 50 mg (50 mg Oral Given 06/24/21 2310)  amLODipine (NORVASC) tablet 5 mg (has no administration in time range)  atorvastatin (LIPITOR) tablet 10 mg (has no administration in time range)  dapagliflozin propanediol (FARXIGA) tablet 10 mg (has no administration in time range)  norethindrone (AYGESTIN) tablet 5 mg (5 mg Oral Given 06/24/21 2324)  gabapentin (NEURONTIN) capsule 100 mg (0 mg Oral Duplicate 12/18/92 1740)  bictegravir-emtricitabine-tenofovir AF (BIKTARVY) 50-200-25 MG per tablet 1 tablet (1 tablet Oral Given 06/24/21 2307)  lisinopril (ZESTRIL) tablet 20 mg (has no administration in time range)  hydrochlorothiazide (HYDRODIURIL) tablet 25 mg (has no administration in time range)  insulin aspart protamine- aspart (NOVOLOG MIX 70/30) injection 60 Units (60 Units Subcutaneous Given 06/24/21 2325)  piperacillin-tazobactam (ZOSYN) IVPB 3.375 g (0 g Intravenous Stopped 06/24/21 1743)  vancomycin (VANCOREADY) IVPB 1750 mg/350 mL (0 mg  Intravenous Stopped 06/24/21 2050)    ED Course  I have reviewed the triage vital signs and the nursing notes.  Pertinent labs & imaging results that were available during my care of the patient were reviewed by me and considered in my medical decision making (see chart for details).    MDM Rules/Calculators/A&P                          Carisma Carthen is a 55 y.o. female with a past medical history significant for hypertension, diabetes, hyperlipidemia, and HIV who presents with worsening left foot infection.  According to patient, she has had a wound there for years but over the last 4 days, acutely worsened.  She saw her orthopedist today who after evaluation feel she needs admission for IV antibiotics.  Patient has been on other antibiotics in the past but this is more of an acute problem.  She denies any fevers or chills.  Denies nausea, vomiting, constipation, diarrhea, or urinary changes.  Denies any numbness or weakness now.  She reports the discomfort is moderate.  On exam, patient does have a large wound on her left lateral foot that is draining and tender.  She has intact sensation and strength in the foot.  Good pulses.  Appears infected with some purulence.  Patient had x-ray showing concern for osteomyelitis.  She is a leukocytosis.  Due to this and the orthopedic recommendations for evaluation in the ED and likely admission, patient be admitted.  Antibiotics were started and anticipate orthopedics consult in the morning for further management.  Patient agreed with plan of care and was admitted in stable condition.   Final Clinical Impression(s) / ED Diagnoses Final diagnoses:  Diabetic osteomyelitis ([place])     Clinical Impression: 1. Diabetic osteomyelitis ([place])   2. Pain     Disposition: Admit  This note was prepared with assistance of Dragon voice recognition software. Occasional wrong-word or sound-a-like substitutions may have occurred due to the inherent  limitations of voice recognition software.     Sigfredo Schreier, Gwenyth Allegra, MD 06/25/21 [phone]

## 2021-06-24 NOTE — H&P (Signed)
History and Physical    Katrina Wolfe VOH:[id number] DOB: 1966-12-18 DOA: 06/24/2021  PCP: Wenda Low, MD Consultants:  ID: Dr. Baxter Flattery, ortho: Dr. Sharol Given, opthalmology: Dr. Vickki Muff at [facility]  Patient coming from: Dr. Jess Barters office.   lives with husband, kids and grandkids.   Chief Complaint: worsening ulcer of her left foot.   HPI: Katrina Wolfe is a 55 y.o. 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 She has a history of an ORIF of the fifth metatarsal with subsequent removal of hardware secondary to infection. She states she has been feeling bad for the past week with nausea, chills, headaches and more drainage from her ulcer that has a foul odor. She has also had more pain in theThe odor is new and she states she knows something is wrong when it smells this way. She denies any fevers, cough, shortness of breath, chest pain, diarrhea or vomiting. She has some discomfort in her left lower abdomen, but no pain. She has been compliant with her medication.    ED Course: vitals: bp: 134/82, HR: 70, afebrile, RR: 18, oxygen: 100% room air. Labs pertinent for elevated WBC to 16.5, elevated LFTS and creatinine of 1.80. xray with chronic osteomyelitis of left foot. Asked to admit.   Review of Systems: As per HPI; otherwise review of systems reviewed and negative.   Ambulatory Status:  can not ambulate. Uses wheelchair  COVID Vaccine Status:  None  Past Medical History:  Diagnosis Date   Diabetes mellitus without complication (HCC)    HIV (human immunodeficiency virus infection) ([place])    Hypercholesterolemia    Hypertension    Renal disorder     Chart merging. History reviewed.   Social History   Socioeconomic History   Marital status: Unknown    Spouse name: Not on file   Number of children: Not on file   Years of education:  Not on file   Highest education level: Not on file  Occupational History   Not on file  Tobacco Use   Smoking status: Not on file   Smokeless tobacco: Not on file  Substance and Sexual Activity   Alcohol use: Not on file   Drug use: Not on file   Sexual activity: Not on file  Other Topics Concern   Not on file  Social History Narrative   Not on file   Social Determinants of Health   Financial Resource Strain: Not on file  Food Insecurity: Not on file  Transportation Needs: Not on file  Physical Activity: Not on file  Stress: Not on file  Social Connections: Not on file  Intimate Partner Violence: Not on file    Allergies  Allergen Reactions   Bactrim [Sulfamethoxazole-Trimethoprim]    Shellfish Allergy     No family history on file.  Prior to Admission medications   Not on File    Physical Exam: Vitals:   06/24/21 1700 06/24/21 1715 06/24/21 1730 06/24/21 1745  BP: 138/82 (!) 134/94 (!) 158/90 (!) 141/90  Pulse: 90 98 99 79  Resp:    18  Temp:      SpO2: 95% 97% 98% 96%     General:  Appears calm and comfortable and is in NAD Eyes:  PERRL, EOMI, normal lids, iris ENT:  grossly normal hearing, lips & tongue, mmm; appropriate dentition Neck:  no LAD, masses or thyromegaly; no carotid bruits Cardiovascular:  RRR, no m/r/g. No LE edema.  Respiratory:   CTA bilaterally with no wheezes/rales/rhonchi.  Normal respiratory effort. Abdomen:  soft, mildly tender in LLL. BS+. Negative murphy's sign.  Back:   normal alignment, no CVAT Skin:  left foot: large, open ulcer on lateral proximal calcaneus. Able to probe into deep tissue. Can not see bone.  No drainage.  Musculoskeletal:  grossly normal tone BUE/BLE, good ROM, charcot deformity of left foot.  Lower extremity:  No LE edema.  Limited foot exam : left foot: see above for skin findings. 2+ distal pulses. Psychiatric:  grossly normal mood and affect, speech fluent and appropriate, AOx3 Neurologic:  CN 2-12  grossly intact, moves all extremities in coordinated fashion, sensation intact    Radiological Exams on Admission: Independently reviewed - see discussion in A/P where applicable  DG Foot Complete Left  Result Date: 06/24/2021 CLINICAL DATA:  Open wound to the lateral aspect of the left foot EXAM: LEFT FOOT - COMPLETE 3+ VIEW COMPARISON:  None. FINDINGS: Lateral ulcer with underlying calcification. A donor site for the calcification is not seen and may be heterotopic bone. Sclerosis and mature periosteal reaction affecting the proximal fifth metatarsal. Lateral arch collapse based on the lateral view, although nonweightbearing. Generalized osteopenia. IMPRESSION: Lateral soft tissue ulcer with immediately subjacent bone or heterotopic ossification. There is also abnormal sclerosis of the fifth metatarsal suggesting chronic osteomyelitis. Electronically Signed   By: Monte Fantasia M.D.   On: 06/24/2021 11:48      Labs on Admission: I have personally reviewed the available labs and imaging studies at the time of the admission.  Pertinent labs:  Creatinine: 1.80 AST: 81 and ALT: 145 WBC: 16.5 Hgb: 15.9/hct: 49.7  Xray foot: chronic osteomyelitis.    Assessment/Plan Principal Problem:   Osteomyelitis of left foot (HCC) -has a history of an ORIF of the fifth metatarsal with subsequent removal of hardware secondary to infection. [facility]inely [facility], Dr. Sharol Given for debridement of her chronic ulcer.  -Elevated WBC here. Xray read as chronic osteo, I called radiology and discussed, but they have no access to see prior films for interval change reporting.  -blood cx pending -received vanc and zosyn in ER.  -continue empiric therapy with vanc and rocephin  -MRI ordered left foot -wound care consult while in hospital for ulcer  -Dr. Louanne Skye with [facility] will see her tomorrow as well.  -tramadol for pain, home dose continued.   Active Problems:   HTN (hypertension) -appears to be  well controlled. Continue home medication.  -- hold her lisinopril if creatinine rises    DM2 (diabetes mellitus, type 2) (HCC) -A1c was 8.6 per patient a couple of months ago -currently on trulicity and humalog, amaryl, farxiga -a1c pending and discussed need for better glycemic control with chronic wound -hold trulicity, had her weekly dose today, hold amaryl  continue humalog and farxiga -SSI and accuchecks    Elevated LFTs -no hx of elevated LFTs. IVF overnight and repeat in AM.  -continues to be elevated would check US/labs -? Side effect from HIV meds, but no hx of elevated LFTs in past.     Acute-on-chronic kidney injury ([place]) -appears a little dry, likely pre renal.  -light IVF overnight and trend. Her baseline has varied from 1.01-1.71 over past year, but average is around 1.4.  -avoid NSAIDs and ACEi. Has NSAID in med list, will need to educate not to use these -her lisinopril-hctz  is ordered for tomorrow, will need to hold if rise in creatinine    HIV (human immunodeficiency virus infection) ([place]) -last viral load undetectable in June of 2022 -follows regularly with Dr. Baxter Flattery, ID.  -continue her Biktarvy, watch LFTs.     HLD (hyperlipidemia)  -lipid panel 05/2021 with LDL of 78 -continue home statin   There is no height or weight on file to calculate BMI.   Level of care: Med-Surg DVT prophylaxis:  Lovenox  Code Status:  Full - confirmed with patient  Family Communication: None present; I spoke with the patient's sister, Katrina Wolfe over the phone: [phone] Disposition Plan:  The patient is from: home  Anticipated d/c is to: home   Anticipated d/c date will depend on clinical response to treatment, but will need >2 midnight for IV antibiotics for management of chronic wound and osteomyelitis.   Consults called: ortho, Dr. Louanne Skye  Admission status:  inpatient    Orma Flaming MD [facility]   How to contact the [facility] Attending or Consulting  provider [place] or covering provider during after hours [place], for this patient?  Check the care team in [facility] and look for a) attending/consulting [facility] provider listed and b) the [facility] team listed Log into [email or web address] and use [place]'s universal password to access. If you do not have the password, please contact the hospital operator. Locate the [facility] provider you are looking for under [facility] and page to a number that you can be directly reached. If you still have difficulty reaching the provider, please page the [facility] (Director on Call) for the Hospitalists listed on amion for assistance.   06/24/2021, 6:14 PM

## 2021-06-25 ENCOUNTER — Inpatient Hospital Stay (HOSPITAL_COMMUNITY): Payer: Medicare Other

## 2021-06-25 LAB — COMPREHENSIVE METABOLIC PANEL
ALT: 145 U/L — ABNORMAL HIGH (ref 0–44)
AST: 119 U/L — ABNORMAL HIGH (ref 15–41)
Albumin: 2.9 g/dL — ABNORMAL LOW (ref 3.5–5.0)
Alkaline Phosphatase: 35 U/L — ABNORMAL LOW (ref 38–126)
Anion gap: 10 (ref 5–15)
BUN: 19 mg/dL (ref 6–20)
CO2: 20 mmol/L — ABNORMAL LOW (ref 22–32)
Calcium: 8.7 mg/dL — ABNORMAL LOW (ref 8.9–10.3)
Chloride: 107 mmol/L (ref 98–111)
Creatinine, Ser: 1.66 mg/dL — ABNORMAL HIGH (ref 0.44–1.00)
GFR, Estimated: 36 mL/min — ABNORMAL LOW (ref >60)
Glucose, Bld: 82 mg/dL (ref 70–99)
Potassium: 3.6 mmol/L (ref 3.5–5.1)
Sodium: 137 mmol/L (ref 135–145)
Total Bilirubin: 0.7 mg/dL (ref 0.3–1.2)
Total Protein: 7 g/dL (ref 6.5–8.1)

## 2021-06-25 LAB — CBC
HCT: 42.3 % (ref 36.0–46.0)
Hemoglobin: 14.3 g/dL (ref 12.0–15.0)
MCH: 31.4 pg (ref 26.0–34.0)
MCHC: 33.8 g/dL (ref 30.0–36.0)
MCV: 93 fL (ref 80.0–100.0)
Platelets: 420 10*3/uL — ABNORMAL HIGH (ref 150–400)
RBC: 4.55 MIL/uL (ref 3.87–5.11)
RDW: 13.4 % (ref 11.5–15.5)
WBC: 14.6 10*3/uL — ABNORMAL HIGH (ref 4.0–10.5)
nRBC: 0 % (ref 0.0–0.2)

## 2021-06-25 LAB — GLUCOSE, CAPILLARY
Glucose-Capillary: 175 mg/dL — ABNORMAL HIGH (ref 70–99)
Glucose-Capillary: 80 mg/dL (ref 70–99)
Glucose-Capillary: 44 mg/dL — CL (ref 70–99)
Glucose-Capillary: 106 mg/dL — ABNORMAL HIGH (ref 70–99)
Glucose-Capillary: 45 mg/dL — ABNORMAL LOW (ref 70–99)
Glucose-Capillary: 186 mg/dL — ABNORMAL HIGH (ref 70–99)

## 2021-06-25 LAB — LACTIC ACID, PLASMA: Lactic Acid, Venous: 1.2 mmol/L (ref 0.5–1.9)

## 2021-06-25 LAB — PROCALCITONIN: Procalcitonin: <0.1 ng/mL

## 2021-06-25 IMAGING — MR MR FOOT*L* W/O CM
4 of 6 series · 19 of 40 positions shown · non-contrast
Comparison: Plain films left foot [DATE] and [DATE].

CLINICAL DATA: Diabetic patient with a chronic skin ulceration at
the base of the fifth metatarsal. History of prior fifth metatarsal
fracture with subsequent infection fracture fixation hardware.

EXAM:
MRI OF THE LEFT FOOT WITHOUT CONTRAST
TECHNIQUE: Multiplanar, multisequence MR imaging of the left foot was
performed. No intravenous contrast was administered.

[Series 5: T1 · oblique · 4.0mm · 0.35mm/px · 3 of 24 slices shown]
[im 1/24]
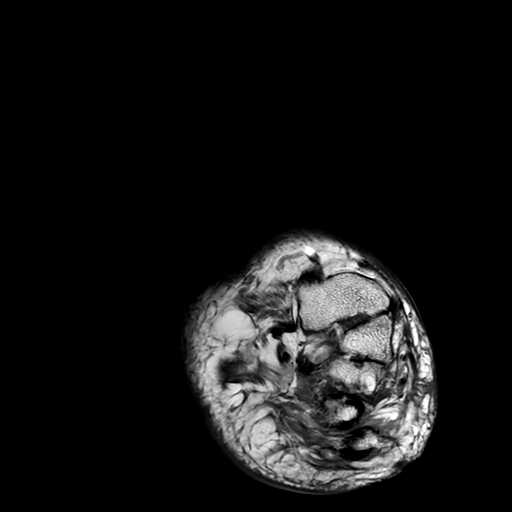
[im 16/24]
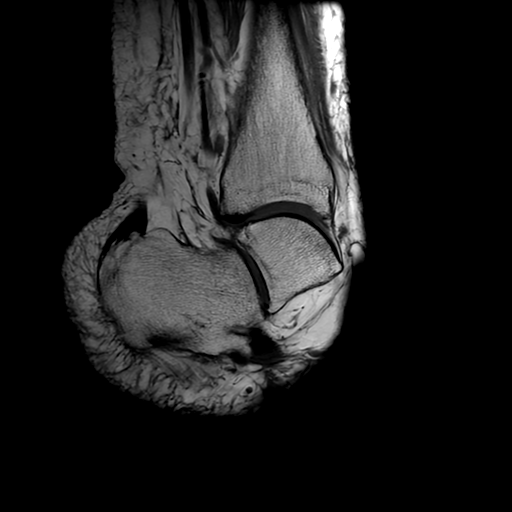
[im 24/24]
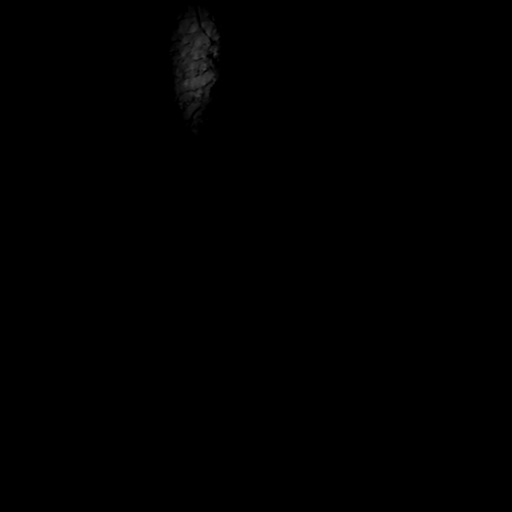

[Series 6: T2 fat-sat · axial · 3.0mm · 0.33mm/px · z∈[-46,+89]mm · 7 of 35 slices shown (1 of 3)]
[im 1/35]
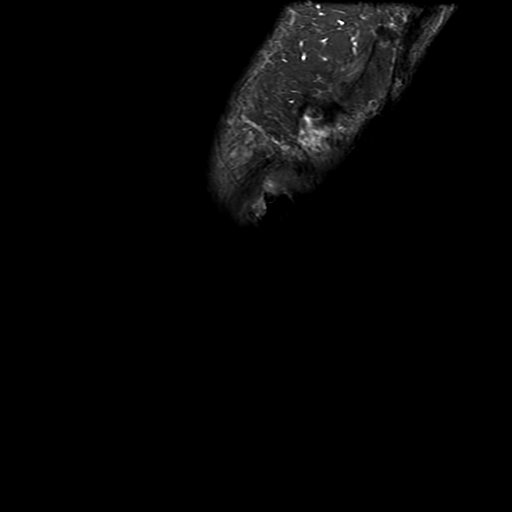
[im 6/35]
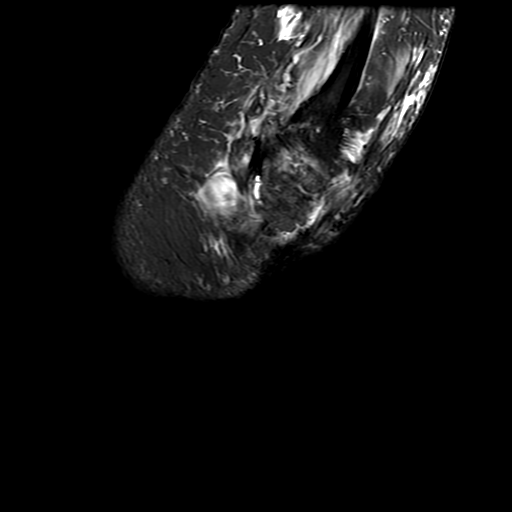
[im 12/35]
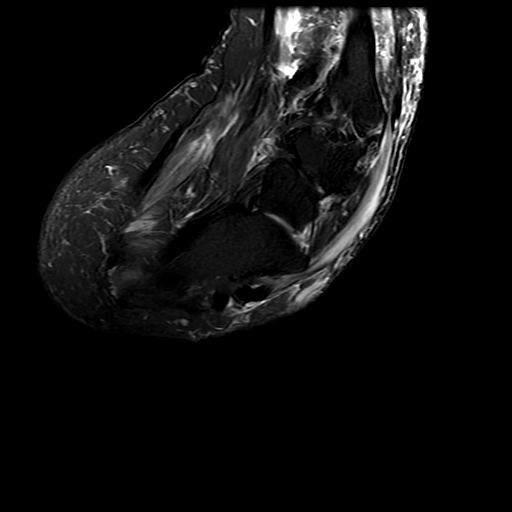
[im 18/35]
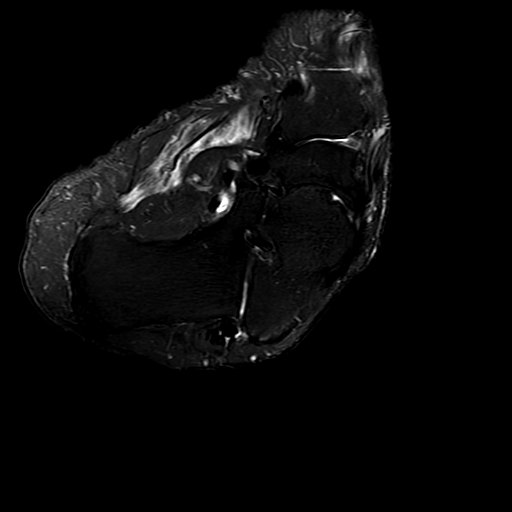
[im 23/35]
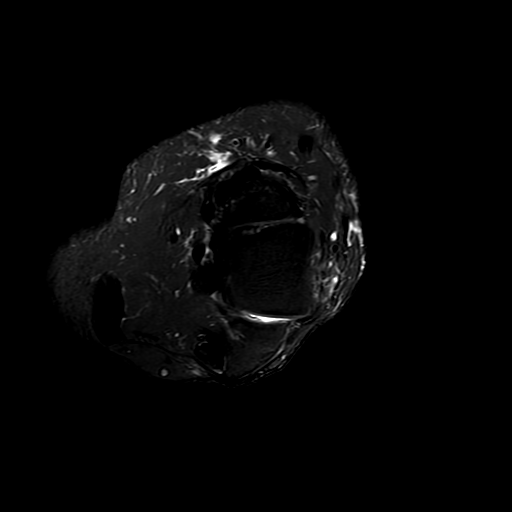
[im 29/35]
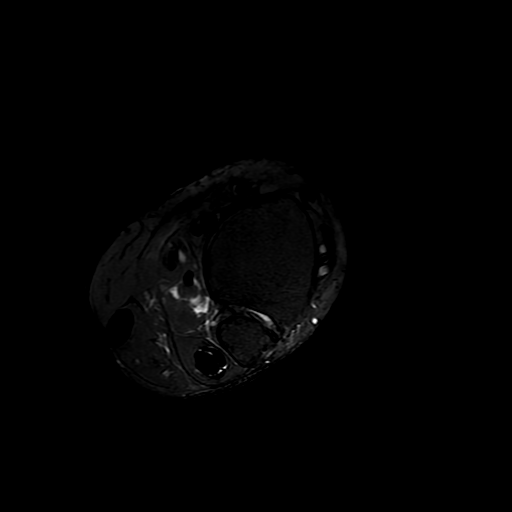
[im 35/35]
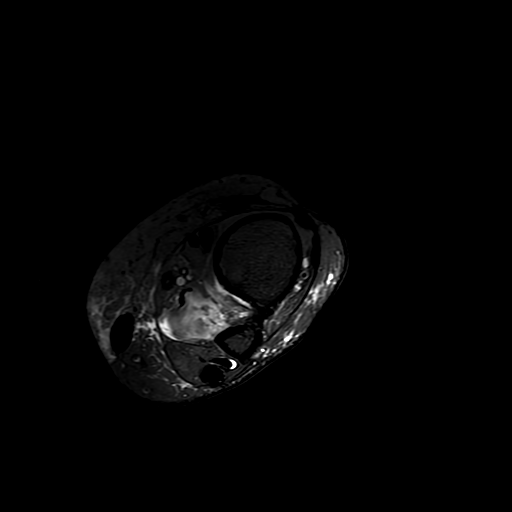

[Series 7: T2 fat-sat · axial · 3.0mm · 0.33mm/px · z∈[-62,+65]mm · 6 of 39 slices shown (2 of 3)]
[im 1/39]
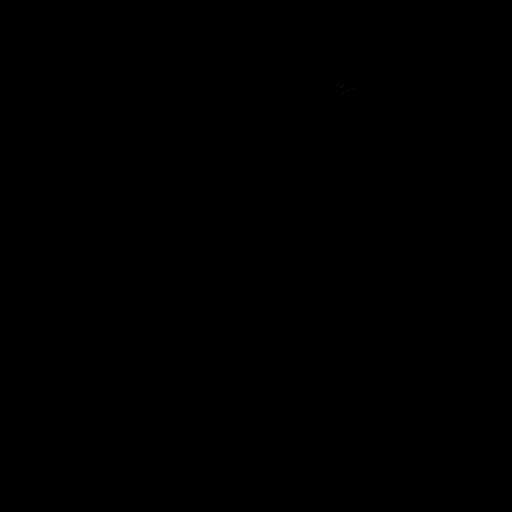
[im 6/39]
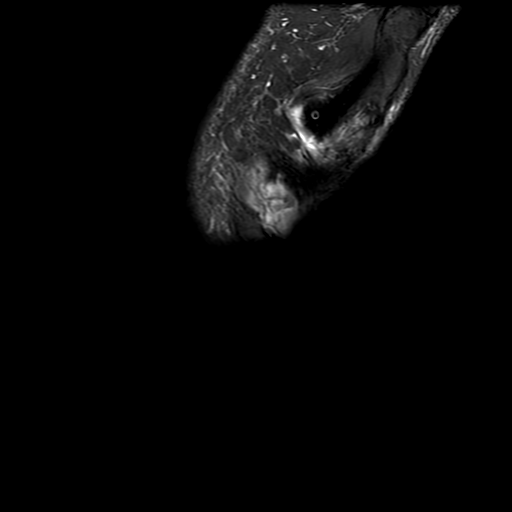
[im 11/39]
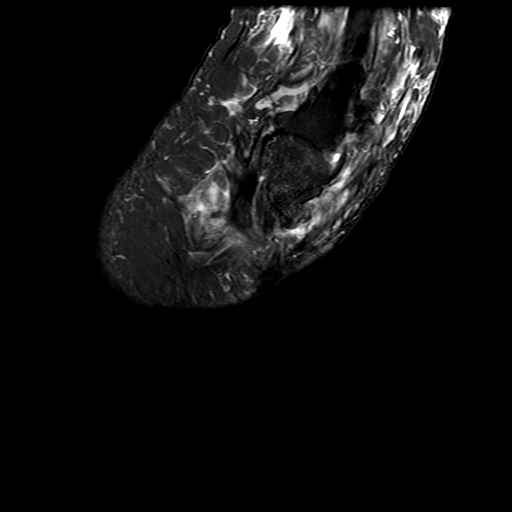
[im 17/39]
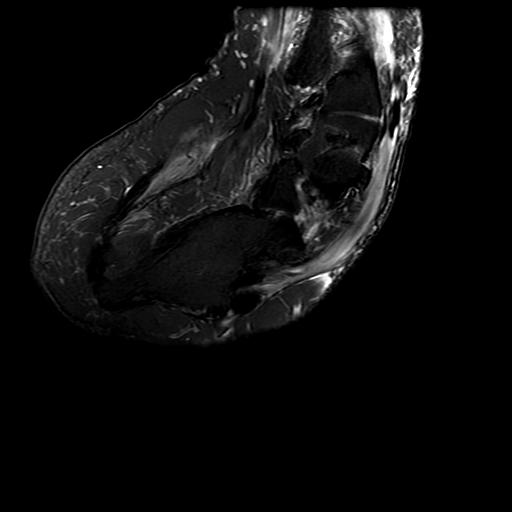
[im 22/39]
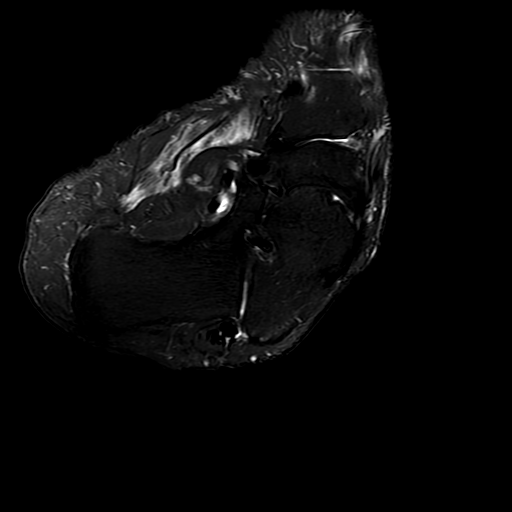
[im 33/39]
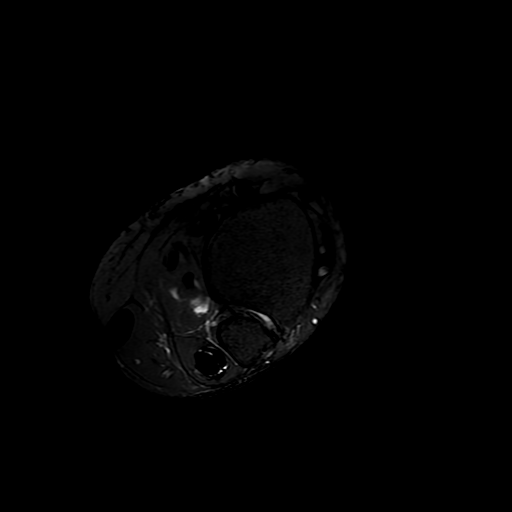

[Series 8: T2 fat-sat · oblique · 3.0mm · 0.31mm/px · 3 of 45 slices shown (3 of 3)]
[im 6/45]
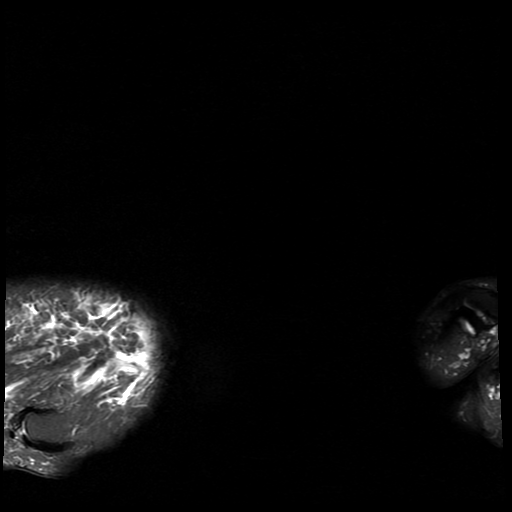
[im 23/45]
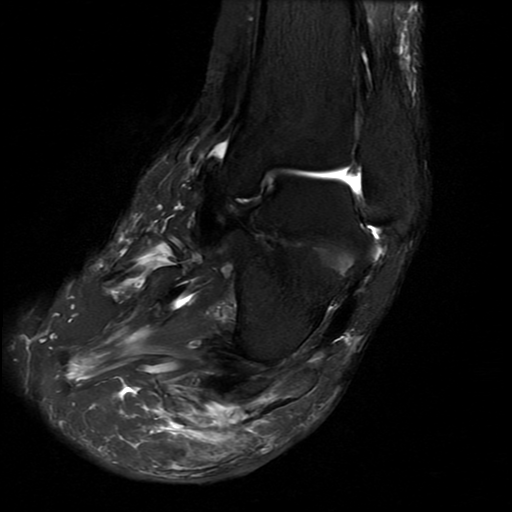
[im 39/45]
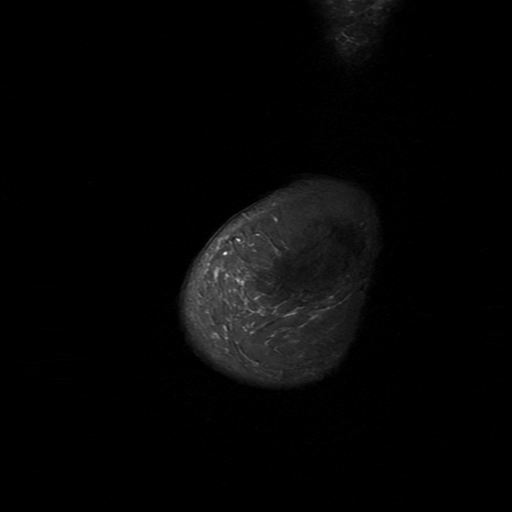

[19 of 40 positions shown; findings below may reference images not displayed]

FINDINGS: Bones/Joint/Cartilage

Remote fracture of the base of the fifth metatarsal is again seen.
Subjacent to a skin wound, there is an ununited fracture fragment
measuring 2.1 cm long by 1.5 cm transverse by 1.5 cm craniocaudal.
There is marrow edema within this fragment. Marrow edema is also
seen in the proximal 1.5 cm of the fifth metatarsal shaft. Cortical
thickening in the fifth metatarsal shaft as seen on prior plain
films is noted.

Ligaments

Negative.

Muscles and Tendons

No intramuscular fluid collection.  No tendon tear.

Soft tissues

Skin ulceration is again noted.  No underlying abscess.
IMPRESSION: Nonunion of a fracture of the base of the fifth metatarsal.

Marrow edema in an ununited fracture fragment at the base of the
fifth metatarsal and adjacent 1.5 cm of the fifth metatarsal shaft
is worrisome for osteomyelitis. There is an overlying skin wound but
no abscess.

## 2021-06-25 IMAGING — MR MR FOOT*L* W/O CM
4 of 6 series · 19 of 40 positions shown · non-contrast
Comparison: Plain films left foot [DATE] and [DATE].

CLINICAL DATA: Diabetic patient with a chronic skin ulceration at
the base of the fifth metatarsal. History of prior fifth metatarsal
fracture with subsequent infection fracture fixation hardware.

EXAM:
MRI OF THE LEFT FOOT WITHOUT CONTRAST
TECHNIQUE: Multiplanar, multisequence MR imaging of the left foot was
performed. No intravenous contrast was administered.

[Series 6: T1 · coronal · 3.0mm · 0.27mm/px · 5 of 43 slices shown (1 of 2)]
[im 1/43]
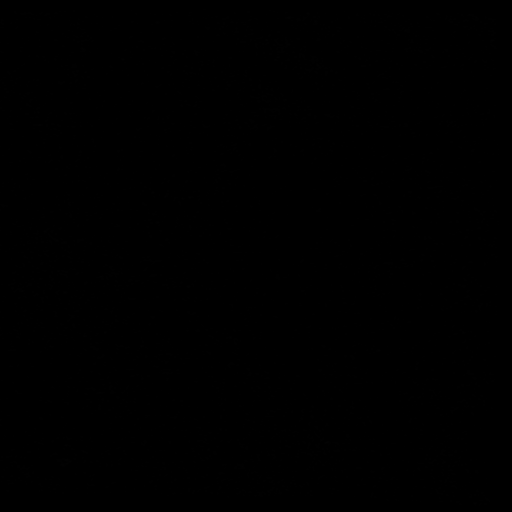
[im 7/43]
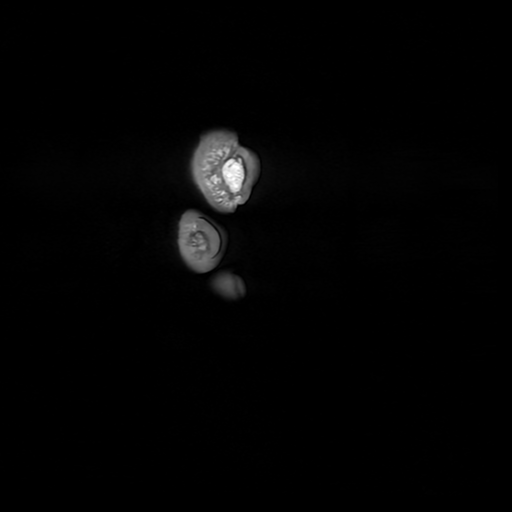
[im 13/43]
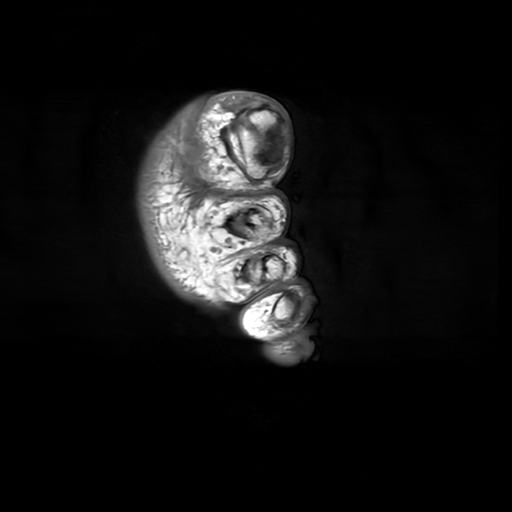
[im 25/43]
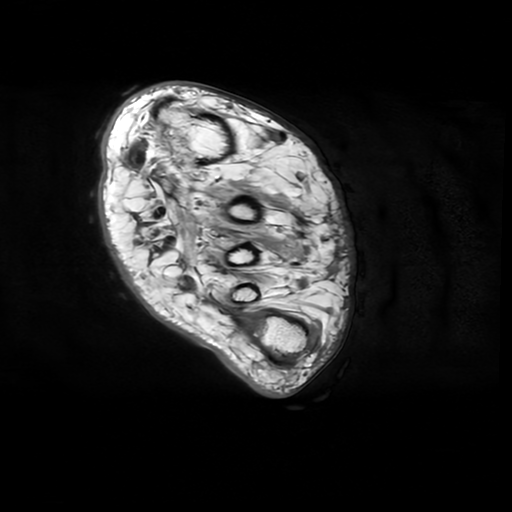
[im 37/43]
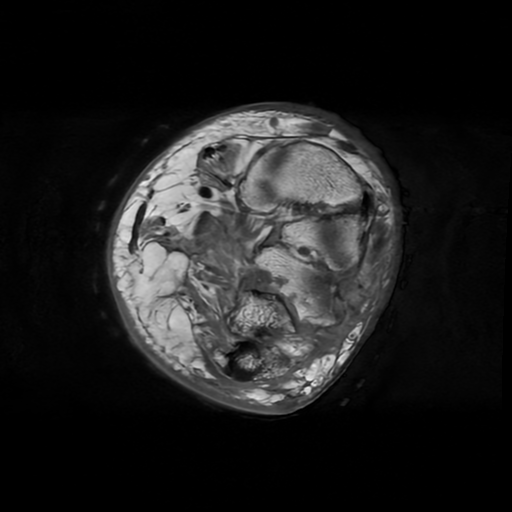

[Series 7: T1 fat-sat · coronal · non-contrast · 3.0mm · 0.27mm/px · 3 of 43 slices shown]
[im 7/43]
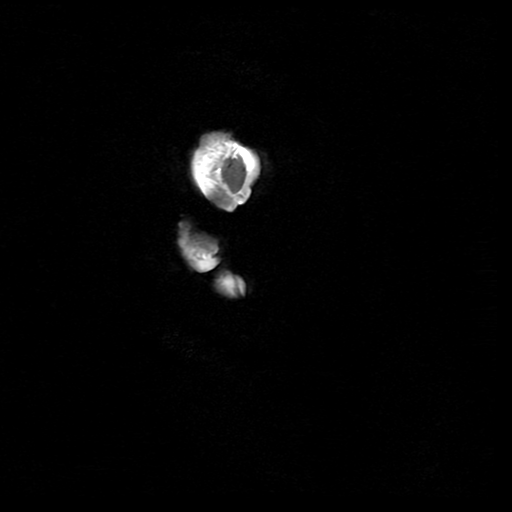
[im 25/43]
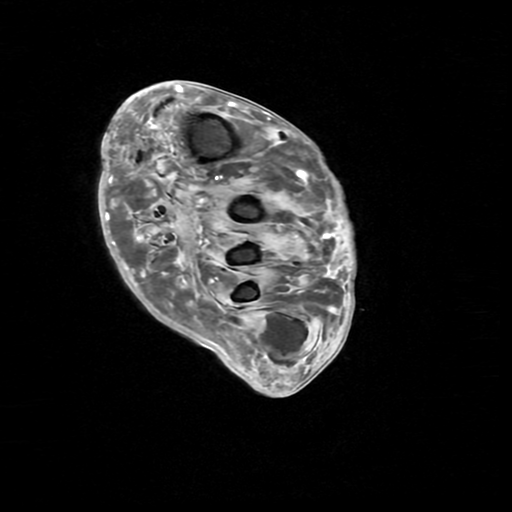
[im 37/43]
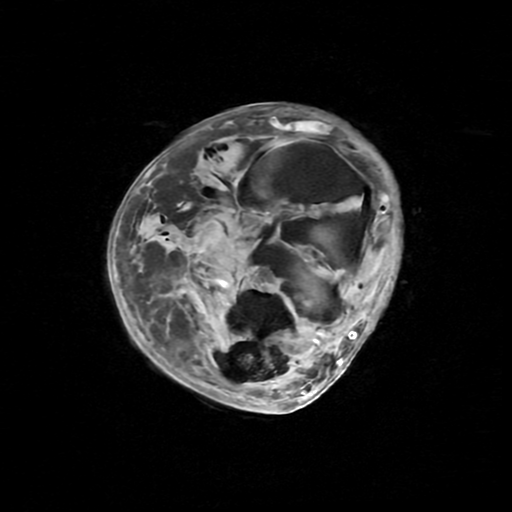

[Series 8: T2 fat-sat · coronal · 3.0mm · 0.27mm/px · 8 of 43 slices shown]
[im 1/43]
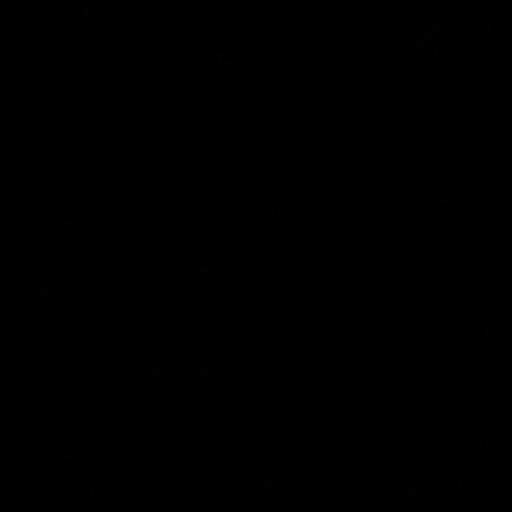
[im 7/43]
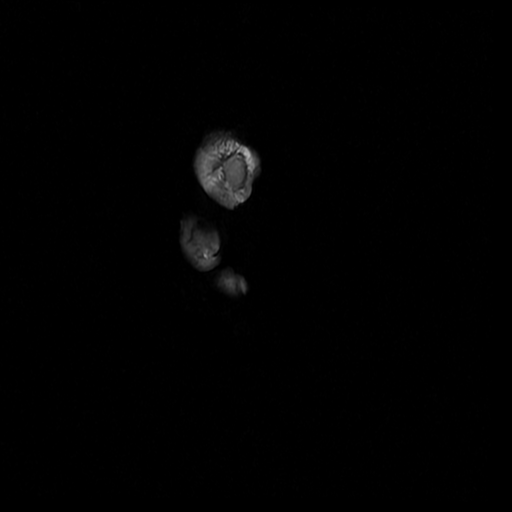
[im 13/43]
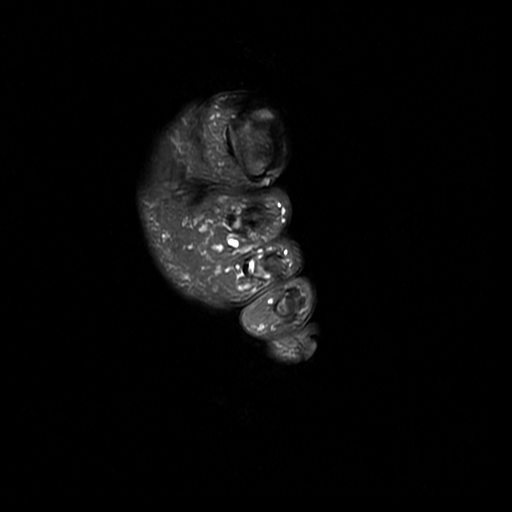
[im 19/43]
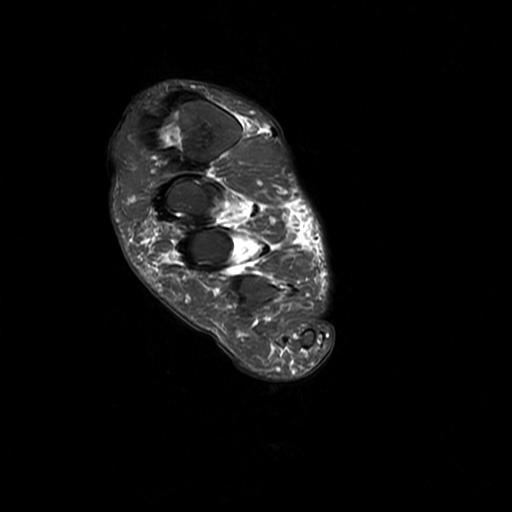
[im 25/43]
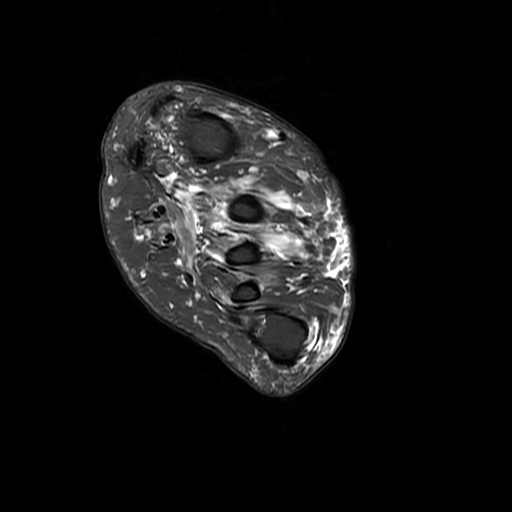
[im 31/43]
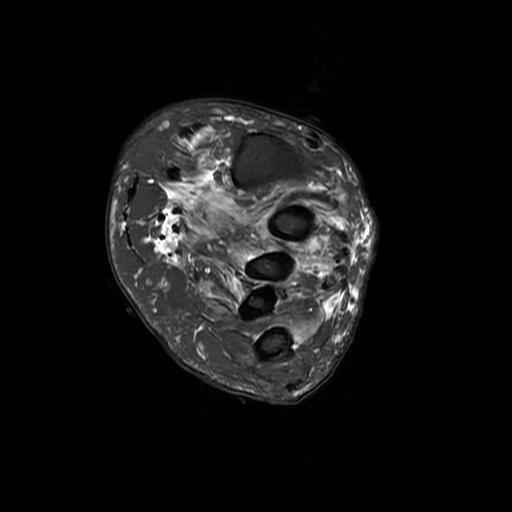
[im 37/43]
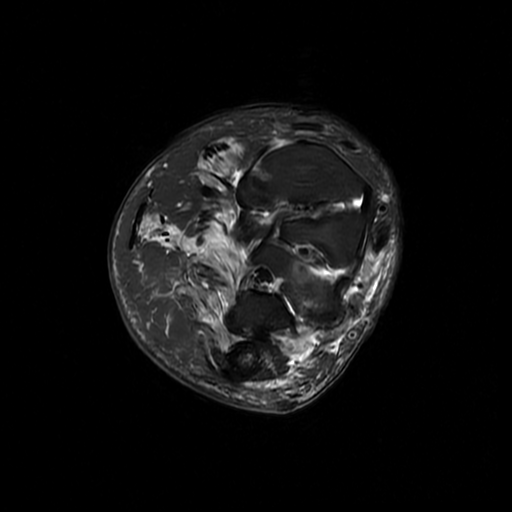
[im 43/43]
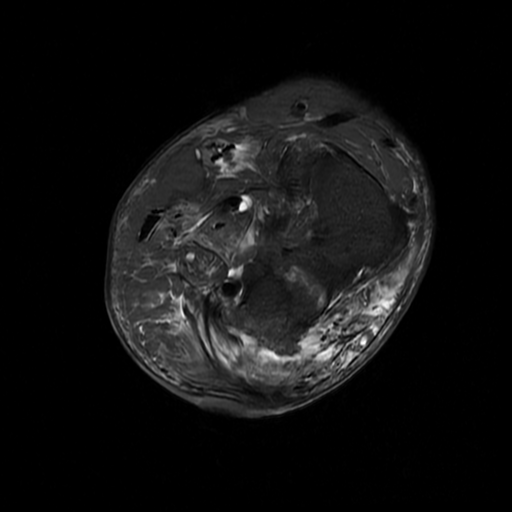

[Series 11: T1 · sagittal · 3.0mm · 0.29mm/px · 3 of 28 slices shown (2 of 2)]
[im 1/28]
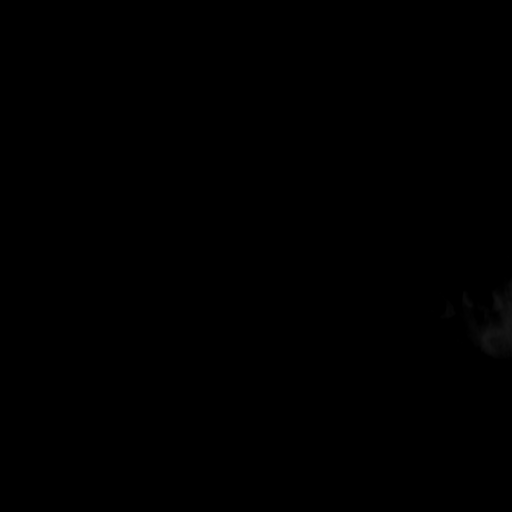
[im 14/28]
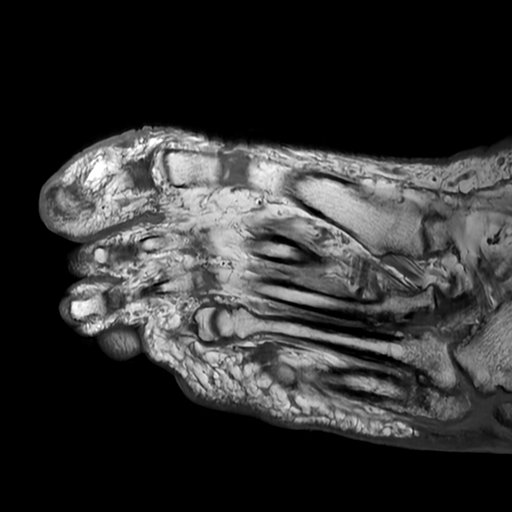
[im 28/28]
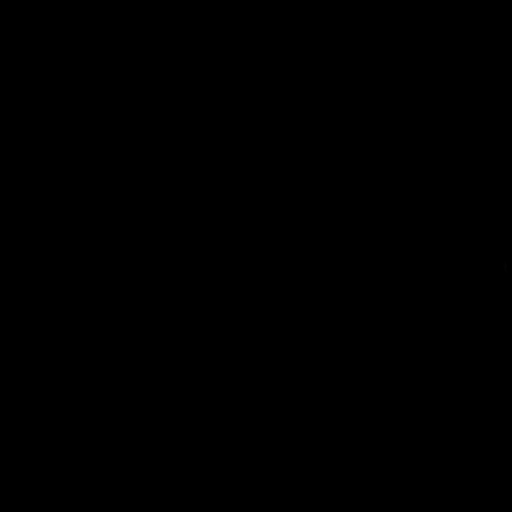

[19 of 40 positions shown; findings below may reference images not displayed]

FINDINGS: Bones/Joint/Cartilage

Remote fracture of the base of the fifth metatarsal is again seen.
Subjacent to a skin wound, there is an ununited fracture fragment
measuring 2.1 cm long by 1.5 cm transverse by 1.5 cm craniocaudal.
There is marrow edema within this fragment. Marrow edema is also
seen in the proximal 1.5 cm of the fifth metatarsal shaft. Cortical
thickening in the fifth metatarsal shaft as seen on prior plain
films is noted.

Ligaments

Negative.

Muscles and Tendons

No intramuscular fluid collection.  No tendon tear.

Soft tissues

Skin ulceration is again noted.  No underlying abscess.
IMPRESSION: Nonunion of a fracture of the base of the fifth metatarsal.

Marrow edema in an ununited fracture fragment at the base of the
fifth metatarsal and adjacent 1.5 cm of the fifth metatarsal shaft
is worrisome for osteomyelitis. There is an overlying skin wound but
no abscess.

## 2021-06-25 MED ORDER — DEXTROSE 50 % IV SOLN
50.0000 mL | Freq: Once | INTRAVENOUS | Status: AC
  Administered 2021-06-25: 50 mL via INTRAVENOUS
  Filled 2021-06-25: qty 50

## 2021-06-25 MED ORDER — MORPHINE SULFATE (PF) 2 MG/ML IV SOLN
2.0000 mg | Freq: Once | INTRAVENOUS | Status: AC
  Administered 2021-06-25: 2 mg via INTRAVENOUS
  Filled 2021-06-25: qty 1

## 2021-06-25 MED ORDER — VANCOMYCIN HCL 1250 MG/250ML IV SOLN
1250.0000 mg | INTRAVENOUS | Status: DC
  Administered 2021-06-25 – 2021-06-30 (×6): 1250 mg via INTRAVENOUS
  Filled 2021-06-25 (×6): qty 250

## 2021-06-25 NOTE — Progress Notes (Addendum)
Pharmacy Antibiotic Note  Katrina Wolfe is a 55 y.o. female admitted on 06/24/2021 with cellulitis. The patient has a history of osteomyelitis and was referred to the ED due to worsening infection of the L lateral foot. WBC 14.6, Scr 1.66 (1.8 on admit). Pharmacy has been consulted for vancomycin dosing. The patient is also on ceftriaxone IV 2g q24h.  Plan: -Vancomycin IV 1250 mg q24h (estimated AUC 517, goal 400-550) -Monitor renal function, clinical status, length of therapy, and cultures as needed -Obtain vancomycin peaks and troughs as clinically indicated  Weight: 125.2 kg (276 lb)  Temp (24hrs), Avg:98.3 F (36.8 C), Min:98.2 F (36.8 C), Max:98.4 F (36.9 C)  Recent Labs  Lab 06/24/21 1107 06/24/21 1648 06/25/21 0147  WBC 16.5*  --  14.6*  CREATININE 1.80*  --  1.66*  LATICACIDVEN  --  1.3  --     CrCl cannot be calculated (Unknown ideal weight.).    Allergies  Allergen Reactions   Shellfish Allergy Anaphylaxis and Swelling   Bactrim [Sulfamethoxazole-Trimethoprim] Hives    Antimicrobials this admission: 7/8 ceftriaxone >> 7/8 vancomycin >> 7/8 piperacillin/tazobactam x1 dose  Microbiology results: 7/8 BCx: NGTD 7/8 Flu A/B: negative 7/8 COVID: negative  Thank you for allowing pharmacy to be a part of this patient's care.  Shauna Hugh, PharmD, [place]  PGY-1 Pharmacy Resident 06/25/2021 10:07 AM  Please check [email or web address] for unit-specific pharmacy phone numbers.

## 2021-06-25 NOTE — Consult Note (Signed)
[place] Nurse Consult Note: Reason for Consult:Left chronic foot wound Wound type: neuropathic, infectious Pressure Injury POA: N/A  Patient was seen earlier today by Orthopedic surgeon, Dr. Louanne Skye, who provided orders for wound care in his consultation note.  I have transposed them to the Nursing Orders (normal saline wet to dry performed daily).  [place] nursing team will not follow, but will remain available to this patient, the nursing and medical teams.  Please re-consult if needed. Thanks, Maudie Flakes, MSN, RN, [place], Ar[facility] Abbott  Pager# [phone]

## 2021-06-25 NOTE — Progress Notes (Signed)
Hypoglycemic Event  CBG: 44  Treatment: 8 oz juice/soda, push 50 of D50 recheck was still low  Symptoms: Sweaty and Shaky  Follow-up CBG: Time: 1716 CBG Result:45  Possible Reasons for Event: Unknown  Comments/MD notified:1638    Katrina Wolfe L Adah Perl

## 2021-06-25 NOTE — Consult Note (Signed)
55 year old female with history of HIV and Diabetes Type 2. She has a cavovarus foot deformity left sided and developed a 5th metatarsal base fracture in 2018. Underwent ORIF of this fracture by Dr. Doran Durand. Underwent Surgical treatment by Dr. Sharol Given in 03/2019 when she developed infection and osteomyelitis at the ORIF site. She presented today to Dr.Duda' s office with increased swelling and drainage from an open wound over the left lateral foot. Dr. Jess Barters PA then referred her to the ER for evaluation for a worsening infection of the left lateral foot with previous history of osteomyelits and non union of fifth metatarsal base fracture. She should undergo MRI to assess for abscess. IV antibiotics are appropriate and dressing changes left lateral foot. Normal saline wet to dry. I will follow with you and intervene as studies suggest may benefit. Await results of MRI.

## 2021-06-25 NOTE — Progress Notes (Signed)
PROGRESS NOTE    Katrina Wolfe  ZOX:[id number] DOB: 1966-03-06 DOA: 06/24/2021 PCP: Wenda Low, MD     Brief Narrative:   55 y.o. 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 She has a history of an ORIF of the fifth metatarsal with subsequent removal of hardware secondary to infection. She states she has been feeling bad for the past week with nausea, chills, headaches and more drainage from her ulcer that has a foul odor. She has also had more pain in theThe odor is new and she states she knows something is wrong when it smells this way. She denies any fevers, cough, shortness of breath, chest pain, diarrhea or vomiting. She has some discomfort in her left lower abdomen, but no pain. She has been compliant with her medication.      ED Course: vitals: bp: 134/82, HR: 70, afebrile, RR: 18, oxygen: 100% room air. Labs pertinent for elevated WBC to 16.5, elevated LFTS and creatinine of 1.80. xray with chronic osteomyelitis of left foot. Asked to admit.      Subjective: A/O x4   Assessment & Plan: Covid vaccination; unvaccinated   Principal Problem:   Osteomyelitis of left foot (HCC) Active Problems:   HTN (hypertension)   HIV (human immunodeficiency virus infection) ([place])   HLD (hyperlipidemia)   Elevated LFTs   Acute-on-chronic kidney injury ([place])   Diabetes mellitus type 2, uncontrolled, with complications ([place])   Diabetic nephropathy ([place])   CKD (chronic kidney disease), stage III ([place])  Osteomyelitis of left foot ([place]) -has a history of an ORIF of the fifth metatarsal with subsequent removal of hardware secondary to infection. Routinely followed at [facility], Dr. Sharol Given for debridement of her chronic ulcer. -Elevated WBC here. Xray read as chronic osteo, I called radiology and discussed, but they  have no access to see prior films for interval change reporting. -blood cx pending -received vanc and zosyn in ER. -continue empiric therapy with vanc and rocephin  -MRI ordered left foot -wound care consult while in hospital for ulcer -Dr. Louanne Skye with [facility] will see her tomorrow as well. -tramadol for pain, home dose continued.       HTN (hypertension) -appears to be well controlled. Continue home medication.  -- hold her lisinopril if creatinine rises     DM2 (diabetes mellitus, type 2) (HCC) -7/8 hemoglobin A1c= 8.8 -currently on trulicity and humalog, amaryl, farxiga -a1c pending and discussed need for better glycemic control with chronic wound -hold trulicity, had her weekly dose today, hold amaryl  continue humalog and farxiga -SSI and accuchecks     Elevated LFTs -no hx of elevated LFTs. IVF overnight and repeat in AM. -continues to be elevated would check US/labs -? Side effect from HIV meds, but no hx of elevated LFTs in past.       Acute-on-CKD stage III (baseline Cr 1.4) -appears a little dry, likely pre renal. -light IVF overnight and trend. Her baseline has varied from 1.01-1.71 over past year, but average is around 1.4. -avoid NSAIDs and ACEi. Has NSAID in med list, will need to educate not to use these -her lisinopril-hctz is ordered for tomorrow, will need to hold if rise in creatinine Lab Results  Component Value Date   CREATININE 1.48 (H) 06/26/2021   CREATININE 1.66 (H) 06/25/2021   CREATININE 1.80 (H) 06/24/2021  HIV (human immunodeficiency virus infection) ([place]) -last viral load undetectable in June of 2022 -follows regularly with Dr. Baxter Flattery, ID. -continue her Biktarvy, watch LFTs.      HLD (hyperlipidemia)  -lipid panel 05/2021 with LDL of 78 -continue home statin   -7/9 lipid panel pending   DVT prophylaxis:  Code Status:  Family Communication:  Status is: Inpatient    Dispo: The patient is from:               Anticipated d/c is  to:               Anticipated d/c date is:               Patient currently       Consultants:  Orthopedic surgery  Procedures/Significant Events:  7/9 MRI LEFT foot W0 contrast; not read   I have personally reviewed and interpreted all radiology studies and my findings are as above.  VENTILATOR SETTINGS:    Cultures   Antimicrobials: Anti-infectives (From admission, onward)    Start     Dose/Rate Route Frequency Ordered Stop   06/25/21 1900  vancomycin (VANCOCIN) IVPB 1000 mg/200 mL premix  Status:  Discontinued        1,000 mg 200 mL/hr over 60 Minutes Intravenous Every 24 hours 06/24/21 1656 06/25/21 1002   06/25/21 1900  vancomycin (VANCOREADY) IVPB 1250 mg/250 mL        1,250 mg 166.7 mL/hr over 90 Minutes Intravenous Every 24 hours 06/25/21 1002     06/25/21 0000  cefTRIAXone (ROCEPHIN) 2 g in sodium chloride 0.9 % 100 mL IVPB        2 g 200 mL/hr over 30 Minutes Intravenous Every 24 hours 06/24/21 1832     06/24/21 2200  bictegravir-emtricitabine-tenofovir AF (BIKTARVY) 50-200-25 MG per tablet 1 tablet        1 tablet Oral Daily at bedtime 06/24/21 1921     06/24/21 1700  vancomycin (VANCOREADY) IVPB 1750 mg/350 mL        1,750 mg 175 mL/hr over 120 Minutes Intravenous  Once 06/24/21 1656 06/24/21 2050   06/24/21 1630  piperacillin-tazobactam (ZOSYN) IVPB 3.375 g        3.375 g 100 mL/hr over 30 Minutes Intravenous  Once 06/24/21 1629 06/24/21 1743         Devices    LINES / TUBES:      Continuous Infusions:  cefTRIAXone (ROCEPHIN)  IV 2 g (06/26/21 0021)   vancomycin 1,250 mg (06/26/21 1943)     Objective: Vitals:   06/26/21 0750 06/26/21 1146 06/26/21 1500 06/26/21 1941  BP: (!) 104/57 (!) 150/87  122/63  Pulse: 77 87  89  Resp: 18 18    Temp: 98.1 F (36.7 C) 99.1 F (37.3 C)    TempSrc: Oral Oral    SpO2: 97% 98%  97%  Weight:      Height:   5' 9.5" (1.765 m)     Intake/Output Summary (Last 24 hours) at 06/26/2021 2003 Last data  filed at 06/25/2021 2241 Gross per 24 hour  Intake 240 ml  Output --  Net 240 ml   Filed Weights   06/24/21 1929  Weight: 125.2 kg    Examination:  General: A/O x4, No acute respiratory distress Eyes: negative scleral hemorrhage, negative anisocoria, negative icterus ENT: Negative Runny nose, negative gingival bleeding, Neck:  Negative scars, masses, torticollis, lymphadenopathy, JVD Lungs: Clear to auscultation bilaterally without wheezes or crackles Cardiovascular: Regular rate and  rhythm without murmur gallop or rub normal S1 and S2 Abdomen: negative abdominal pain, nondistended, positive soft, bowel sounds, no rebound, no ascites, no appreciable mass Extremities: left foot: large, open ulcer on lateral proximal calcaneus. Skin: Negative rashes, lesions, ulcers Psychiatric:  Negative depression, negative anxiety, negative fatigue, negative mania  Central nervous system:  Cranial nerves II through XII intact, tongue/uvula midline, all extremities muscle strength 5/5, sensation intact throughout, negative dysarthria, negative expressive aphasia, negative receptive aphasia.  .     Data Reviewed: Care during the described time interval was provided by me .  I have reviewed this patient's available data, including medical history, events of note, physical examination, and all test results as part of my evaluation.  CBC: Recent Labs  Lab 06/24/21 1107 06/25/21 0147 06/26/21 0032  WBC 16.5* 14.6* 12.1*  NEUTROABS 11.9*  --  8.7*  HGB 15.9* 14.3 13.8  HCT 49.7* 42.3 40.9  MCV 96.5 93.0 93.8  PLT 555* 420* 858*   Basic Metabolic Panel: Recent Labs  Lab 06/24/21 1107 06/25/21 0147 06/26/21 0032  NA 137 137 135  K 3.7 3.6 4.1  CL 106 107 104  CO2 19* 20* 22  GLUCOSE 83 82 188*  BUN 22* 19 13  CREATININE 1.80* 1.66* 1.48*  CALCIUM 9.1 8.7* 8.7*  MG  --   --  1.9  PHOS  --   --  3.2   GFR: Estimated Creatinine Clearance: 62.1 mL/min (A) (by C-G formula based on SCr  of 1.48 mg/dL (H)). Liver Function Tests: Recent Labs  Lab 06/24/21 1107 06/25/21 0147 06/26/21 0032  AST 81* 119* 138*  ALT 145* 145* 171*  ALKPHOS 38 35* 36*  BILITOT 0.8 0.7 0.7  PROT 8.0 7.0 6.7  ALBUMIN 3.5 2.9* 2.7*   No results for input(s): LIPASE, AMYLASE in the last 168 hours. No results for input(s): AMMONIA in the last 168 hours. Coagulation Profile: No results for input(s): INR, PROTIME in the last 168 hours. Cardiac Enzymes: No results for input(s): CKTOTAL, CKMB, CKMBINDEX, TROPONINI in the last 168 hours. BNP (last 3 results) No results for input(s): PROBNP in the last 8760 hours. HbA1C: Recent Labs    06/24/21 1107  HGBA1C 8.8*   CBG: Recent Labs  Lab 06/25/21 1716 06/25/21 2143 06/26/21 0626 06/26/21 1145 06/26/21 1633  GLUCAP 106* 186* 93 70 102*   Lipid Profile: No results for input(s): CHOL, HDL, LDLCALC, TRIG, CHOLHDL, LDLDIRECT in the last 72 hours. Thyroid Function Tests: No results for input(s): TSH, T4TOTAL, FREET4, T3FREE, THYROIDAB in the last 72 hours. Anemia Panel: No results for input(s): VITAMINB12, FOLATE, FERRITIN, TIBC, IRON, RETICCTPCT in the last 72 hours. Sepsis Labs: Recent Labs  Lab 06/24/21 1648 06/25/21 2038 06/26/21 0032  PROCALCITON  --  <0.10 <0.10  LATICACIDVEN 1.3 1.2 1.1    Recent Results (from the past 240 hour(s))  Blood culture (routine x 2)     Status: None (Preliminary result)   Collection Time: 06/24/21  4:29 PM   Specimen: BLOOD  Result Value Ref Range Status   Specimen Description BLOOD LEFT ANTECUBITAL  Final   Special Requests   Final    BOTTLES DRAWN AEROBIC AND ANAEROBIC Blood Culture adequate volume   Culture   Final    NO GROWTH 2 DAYS Performed at [facility], [address] [place], [place] [address]    Report Status PENDING  Incomplete  Resp Panel by RT-PCR (Flu A&B, Covid) Nasopharyngeal Swab     Status:  None   Collection Time: 06/24/21  4:29 PM   Specimen: Nasopharyngeal  Swab; Nasopharyngeal(NP) swabs in vial transport medium  Result Value Ref Range Status   SARS Coronavirus 2 by RT PCR NEGATIVE NEGATIVE Final    Comment: (NOTE) SARS-CoV-2 target nucleic acids are NOT DETECTED.  The SARS-CoV-2 RNA is generally detectable in upper respiratory specimens during the acute phase of infection. The lowest concentration of SARS-CoV-2 viral copies this assay can detect is 138 copies/mL. A negative result does not preclude SARS-Cov-2 infection and should not be used as the sole basis for treatment or other patient management decisions. A negative result may occur with  improper specimen collection/handling, submission of specimen other than nasopharyngeal swab, presence of viral mutation(s) within the areas targeted by this assay, and inadequate number of viral copies(<138 copies/mL). A negative result must be combined with clinical observations, patient history, and epidemiological information. The expected result is Negative.  Fact Sheet for Patients:  EntrepreneurPulse.com.au  Fact Sheet for Healthcare Providers:  IncredibleEmployment.be  This test is no t yet approved or cleared by the [place] FDA and  has been authorized for detection and/or diagnosis of SARS-CoV-2 by FDA under an Emergency Use Authorization (EUA). This EUA will remain  in effect (meaning this test can be used) for the duration of the COVID-19 declaration under Section 564(b)(1) of the Act, 21 U.S.C.section 360bbb-3(b)(1), unless the authorization is terminated  or revoked sooner.       Influenza A by PCR NEGATIVE NEGATIVE Final   Influenza B by PCR NEGATIVE NEGATIVE Final    Comment: (NOTE) The Xpert Xpress SARS-CoV-2/FLU/RSV plus assay is intended as an aid in the diagnosis of influenza from Nasopharyngeal swab specimens and should not be used as a sole basis for treatment. Nasal washings and aspirates are unacceptable for Xpert Xpress  SARS-CoV-2/FLU/RSV testing.  Fact Sheet for Patients: EntrepreneurPulse.com.au  Fact Sheet for Healthcare Providers: IncredibleEmployment.be  This test is not yet approved or cleared by the [place] FDA and has been authorized for detection and/or diagnosis of SARS-CoV-2 by FDA under an Emergency Use Authorization (EUA). This EUA will remain in effect (meaning this test can be used) for the duration of the COVID-19 declaration under Section 564(b)(1) of the Act, 21 U.S.C. section 360bbb-3(b)(1), unless the authorization is terminated or revoked.  Performed at [facility], [place] [address], [place], [place] [address]   Blood culture (routine x 2)     Status: None (Preliminary result)   Collection Time: 06/24/21  4:34 PM   Specimen: BLOOD  Result Value Ref Range Status   Specimen Description BLOOD SITE NOT SPECIFIED  Final   Special Requests   Final    BOTTLES DRAWN AEROBIC AND ANAEROBIC Blood Culture results may not be optimal due to an inadequate volume of blood received in culture bottles   Culture   Final    NO GROWTH 2 DAYS Performed at [facility], [place] [address] [place], [place] [address]    Report Status PENDING  Incomplete  Surgical PCR screen     Status: None   Collection Time: 06/26/21  4:16 PM   Specimen: Nasal Mucosa; Nasal Swab  Result Value Ref Range Status   MRSA, PCR NEGATIVE NEGATIVE Final   Staphylococcus aureus NEGATIVE NEGATIVE Final    Comment: (NOTE) The Xpert SA Assay (FDA approved for NASAL specimens in patients 21 years of age and older), is one component of a comprehensive surveillance program. It is not intended  to diagnose infection nor to guide or monitor treatment. Performed at [facility], [place] [address] [place], [place] [address]          Radiology Studies: MR FOOT LEFT WO CONTRAST  Result Date: 06/26/2021 CLINICAL DATA:  Diabetic patient with a chronic skin ulceration at  the base of the fifth metatarsal. History of prior fifth metatarsal fracture with subsequent infection fracture fixation hardware. EXAM: MRI OF THE LEFT FOOT WITHOUT CONTRAST TECHNIQUE: Multiplanar, multisequence MR imaging of the left foot was performed. No intravenous contrast was administered. COMPARISON:  Plain films left foot 03/01/2019 and 06/24/2021. FINDINGS: Bones/Joint/Cartilage Remote fracture of the base of the fifth metatarsal is again seen. Subjacent to a skin wound, there is an ununited fracture fragment measuring 2.1 cm long by 1.5 cm transverse by 1.5 cm craniocaudal. There is marrow edema within this fragment. Marrow edema is also seen in the proximal 1.5 cm of the fifth metatarsal shaft. Cortical thickening in the fifth metatarsal shaft as seen on prior plain films is noted. Ligaments Negative. Muscles and Tendons No intramuscular fluid collection.  No tendon tear. Soft tissues Skin ulceration is again noted.  No underlying abscess. 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 Marrow edema in an ununited fracture fragment at the base of the fifth metatarsal and adjacent 1.5 cm of the fifth metatarsal shaft is worrisome for osteomyelitis. There is an overlying skin wound but no abscess. Electronically Signed   By: Inge Rise M.D.   On: 06/26/2021 09:11        Scheduled Meds:  amLODipine  5 mg Oral q AM   aspirin EC  81 mg Oral q AM   atorvastatin  10 mg Oral Daily   bictegravir-emtricitabine-tenofovir AF  1 tablet Oral QHS   dapagliflozin propanediol  10 mg Oral q AM   enoxaparin (LOVENOX) injection  40 mg Subcutaneous Q24H   gabapentin  100 mg Oral QID   hydrochlorothiazide  25 mg Oral Daily   insulin aspart  0-9 Units Subcutaneous TID WC   insulin aspart protamine- aspart  60 Units Subcutaneous 2 times per day   lisinopril  20 mg Oral Daily   mupirocin ointment  1 application Nasal BID   norethindrone  5 mg Oral BID   Continuous Infusions:   cefTRIAXone (ROCEPHIN)  IV 2 g (06/26/21 0021)   vancomycin 1,250 mg (06/26/21 1943)     LOS: 2 days    Time spent:40 min    Talli Kimmer, Geraldo Docker, MD [facility]   If 7PM-7AM, please contact night-coverage 06/26/2021, 8:03 PM

## 2021-06-25 NOTE — Progress Notes (Signed)
Patient ID: Katrina Wolfe, female   DOB: 1966/05/24, 55 y.o.   MRN: [id number] MRI is done but report is not available as yet. I looked at the study and can not identify an abscess or area that would benefit from acute drainage. I will await report. Consider OR for surgical incision drainage and debridment of the diabetic foot ulcer. I will see her in the AM after morning OR case.

## 2021-06-25 NOTE — Progress Notes (Signed)
     Subjective:   Awake alert and oriented x 4, mepilex bandaids left lateral foot. MRI unable to perform due to back pain yesterday, This may need assessment as a possible distal infection itself as discitis can occur with hematogenous spread of infection.   Patient reports pain as moderate.    Objective:   VITALS:  Temp:  [98.2 F (36.8 C)-98.4 F (36.9 C)] 98.2 F (36.8 C) (07/09 0859) Pulse Rate:  [79-118] 97 (07/09 0859) Resp:  [16-18] 17 (07/09 0859) BP: (105-163)/(76-99) 151/99 (07/09 0859) SpO2:  [95 %-100 %] 100 % (07/09 0859) Weight:  [125.2 kg] 125.2 kg (07/08 1929)  ABD soft Intact pulses distally Dorsiflexion/Plantar flexion intact Compartment soft Has open draining ulcer left lateral foot with obvious inversion and internal rotation deformity of left foot and ankle , Indicates that this is related to initial surgery ORIF of left 5th MT base, but that fracture was not traumatic and I suspect the deformity lead to the  5th MT base fracture due to stress and increases forces about the base of the left 5th MT.    LABS Recent Labs    06/24/21 1107 06/25/21 0147  HGB 15.9* 14.3  WBC 16.5* 14.6*  PLT 555* 420*   Recent Labs    06/24/21 1107 06/25/21 0147  NA 137 137  K 3.7 3.6  CL 106 107  CO2 19* 20*  BUN 22* 19  CREATININE 1.80* 1.66*  GLUCOSE 83 82   No results for input(s): LABPT, INR in the last 72 hours.   Assessment/Plan:   Infected diabetic lateral foot ulcer base of 5th MT associated with foot and ankle deformity that is apparently fixed. Likely she will have persistent recurring problems due to weight distribution over the lateral foot due to her internal rotation, inversion and metatarsal adductus deformities. This is similar to a club foot.   Up with therapy Continue ABX therapy due to left mid lateral foot infection MRI is ordered and hopefully with sedation we will be able to further assess extent of infection of her foot.   Basil Dess 06/25/2021, 10:14 AM Patient ID: Katrina Wolfe, female   DOB: [birth year], 55 y.o.   MRN: [id number]

## 2021-06-26 DIAGNOSIS — E0821 Diabetes mellitus due to underlying condition with diabetic nephropathy: Secondary | ICD-10-CM

## 2021-06-26 DIAGNOSIS — I10 Essential (primary) hypertension: Secondary | ICD-10-CM

## 2021-06-26 DIAGNOSIS — E7800 Pure hypercholesterolemia, unspecified: Secondary | ICD-10-CM

## 2021-06-26 DIAGNOSIS — N183 Chronic kidney disease, stage 3 unspecified: Secondary | ICD-10-CM | POA: Diagnosis present

## 2021-06-26 DIAGNOSIS — E1121 Type 2 diabetes mellitus with diabetic nephropathy: Secondary | ICD-10-CM | POA: Diagnosis present

## 2021-06-26 DIAGNOSIS — E1165 Type 2 diabetes mellitus with hyperglycemia: Secondary | ICD-10-CM | POA: Diagnosis present

## 2021-06-26 DIAGNOSIS — IMO0002 Reserved for concepts with insufficient information to code with codable children: Secondary | ICD-10-CM | POA: Diagnosis present

## 2021-06-26 LAB — CBC WITH DIFFERENTIAL/PLATELET
Abs Immature Granulocytes: 0.04 10*3/uL (ref 0.00–0.07)
Basophils Absolute: 0 10*3/uL (ref 0.0–0.1)
Basophils Relative: 0 %
Eosinophils Absolute: 0.2 10*3/uL (ref 0.0–0.5)
Eosinophils Relative: 2 %
HCT: 40.9 % (ref 36.0–46.0)
Hemoglobin: 13.8 g/dL (ref 12.0–15.0)
Immature Granulocytes: 0 %
Lymphocytes Relative: 19 %
Lymphs Abs: 2.3 10*3/uL (ref 0.7–4.0)
MCH: 31.7 pg (ref 26.0–34.0)
MCHC: 33.7 g/dL (ref 30.0–36.0)
MCV: 93.8 fL (ref 80.0–100.0)
Monocytes Absolute: 0.9 10*3/uL (ref 0.1–1.0)
Monocytes Relative: 7 %
Neutro Abs: 8.7 10*3/uL — ABNORMAL HIGH (ref 1.7–7.7)
Neutrophils Relative %: 72 %
Platelets: 435 10*3/uL — ABNORMAL HIGH (ref 150–400)
RBC: 4.36 MIL/uL (ref 3.87–5.11)
RDW: 13.3 % (ref 11.5–15.5)
WBC: 12.1 10*3/uL — ABNORMAL HIGH (ref 4.0–10.5)
nRBC: 0 % (ref 0.0–0.2)

## 2021-06-26 LAB — COMPREHENSIVE METABOLIC PANEL
ALT: 171 U/L — ABNORMAL HIGH (ref 0–44)
AST: 138 U/L — ABNORMAL HIGH (ref 15–41)
Albumin: 2.7 g/dL — ABNORMAL LOW (ref 3.5–5.0)
Alkaline Phosphatase: 36 U/L — ABNORMAL LOW (ref 38–126)
Anion gap: 9 (ref 5–15)
BUN: 13 mg/dL (ref 6–20)
CO2: 22 mmol/L (ref 22–32)
Calcium: 8.7 mg/dL — ABNORMAL LOW (ref 8.9–10.3)
Chloride: 104 mmol/L (ref 98–111)
Creatinine, Ser: 1.48 mg/dL — ABNORMAL HIGH (ref 0.44–1.00)
GFR, Estimated: 42 mL/min — ABNORMAL LOW (ref >60)
Glucose, Bld: 188 mg/dL — ABNORMAL HIGH (ref 70–99)
Potassium: 4.1 mmol/L (ref 3.5–5.1)
Sodium: 135 mmol/L (ref 135–145)
Total Bilirubin: 0.7 mg/dL (ref 0.3–1.2)
Total Protein: 6.7 g/dL (ref 6.5–8.1)

## 2021-06-26 LAB — GLUCOSE, CAPILLARY
Glucose-Capillary: 93 mg/dL (ref 70–99)
Glucose-Capillary: 70 mg/dL (ref 70–99)
Glucose-Capillary: 102 mg/dL — ABNORMAL HIGH (ref 70–99)
Glucose-Capillary: 174 mg/dL — ABNORMAL HIGH (ref 70–99)

## 2021-06-26 LAB — PROCALCITONIN: Procalcitonin: <0.1 ng/mL

## 2021-06-26 LAB — LACTIC ACID, PLASMA: Lactic Acid, Venous: 1.1 mmol/L (ref 0.5–1.9)

## 2021-06-26 LAB — PHOSPHORUS: Phosphorus: 3.2 mg/dL (ref 2.5–4.6)

## 2021-06-26 LAB — MAGNESIUM: Magnesium: 1.9 mg/dL (ref 1.7–2.4)

## 2021-06-26 LAB — SURGICAL PCR SCREEN
MRSA, PCR: NEGATIVE
Staphylococcus aureus: NEGATIVE

## 2021-06-26 MED ORDER — MUPIROCIN 2 % EX OINT
1.0000 "application " | TOPICAL_OINTMENT | Freq: Two times a day (BID) | CUTANEOUS | Status: AC
  Administered 2021-06-26 – 2021-07-01 (×9): 1 via NASAL
  Filled 2021-06-26 (×2): qty 22

## 2021-06-26 NOTE — Progress Notes (Addendum)
Patient ID: Katrina Wolfe, female   DOB: 08-Jul-1966, 55 y.o.   MRN: [id number]     Subjective:   Has open wound left lateral foot overlying base of 5th MT. MRI with edema within bone at the base of 5th MT. This is an infected diabetic ulcer associated with left foot deformity. Osteomyelitis at the base of the left.  Patient reports pain as mild.    Objective:   VITALS:  Temp:  [98.1 F (36.7 C)-98.3 F (36.8 C)] 98.1 F (36.7 C) (07/10 0750) Pulse Rate:  [77-85] 77 (07/10 0750) Resp:  [17-18] 18 (07/10 0750) BP: (104-131)/(57-87) 104/57 (07/10 0750) SpO2:  [93 %-97 %] 97 % (07/10 0750)  ABD soft Intact pulses distally Compartment soft MRI reviewed with patient and she understands that a surgical debridement of the left lateral foot wound is  recommended.   LABS Recent Labs    06/24/21 1107 06/25/21 0147 06/26/21 0032  HGB 15.9* 14.3 13.8  WBC 16.5* 14.6* 12.1*  PLT 555* 420* 435*   Recent Labs    06/25/21 0147 06/26/21 0032  NA 137 135  K 3.6 4.1  CL 107 104  CO2 20* 22  BUN 19 13  CREATININE 1.66* 1.48*  GLUCOSE 82 188*   No results for input(s): LABPT, INR in the last 72 hours.   Assessment/Plan:Infected left lateral foot diabetic ulce     Osteomyelitis left foot 5th Metatarsal base with nonunion of 5th MT base fracture.  Advance diet Up with therapy I will place on schedule for formal debridement of the lateral foot ulcer in the OR tomorrow AM followed by application of wound vac and antibiotic beads. Will finalize with OR, And  they indicate the earliest time for surgery is likely 12 noon.  Basil Dess 06/26/2021, 11:26 AM

## 2021-06-26 NOTE — Progress Notes (Signed)
PROGRESS NOTE    Katrina Wolfe  EHU:[id number] DOB: 12-07-1966 DOA: 06/24/2021 PCP: Wenda Low, MD     Brief Narrative:   55 y.o. 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 She has a history of an ORIF of the fifth metatarsal with subsequent removal of hardware secondary to infection. She states she has been feeling bad for the past week with nausea, chills, headaches and more drainage from her ulcer that has a foul odor. She has also had more pain in theThe odor is new and she states she knows something is wrong when it smells this way. She denies any fevers, cough, shortness of breath, chest pain, diarrhea or vomiting. She has some discomfort in her left lower abdomen, but no pain. She has been compliant with her medication.      ED Course: vitals: bp: 134/82, HR: 70, afebrile, RR: 18, oxygen: 100% room air. Labs pertinent for elevated WBC to 16.5, elevated LFTS and creatinine of 1.80. xray with chronic osteomyelitis of left foot. Asked to admit.      Subjective: 7/10 afebrile overnight,   Assessment & Plan: Covid vaccination; unvaccinated does not want vaccination   Principal Problem:   Osteomyelitis of left foot (HCC) Active Problems:   HTN (hypertension)   HIV (human immunodeficiency virus infection) ([place])   HLD (hyperlipidemia)   Elevated LFTs   Acute-on-chronic kidney injury ([place])   Diabetes mellitus type 2, uncontrolled, with complications ([place])   Diabetic nephropathy ([place])   CKD (chronic kidney disease), stage III ([place])  Osteomyelitis of left foot ([place]) -has a history of an ORIF of the fifth metatarsal with subsequent removal of hardware secondary to infection. Routinely followed at [facility], Dr. Sharol Given for debridement of her chronic ulcer. -Elevated WBC here. Xray read as chronic osteo,  I called radiology and discussed, but they have no access to see prior films for interval change reporting. -blood cx pending -received vanc and zosyn in ER. -continue empiric therapy with vanc and rocephin  -MRI ordered left foot -wound care consult while in hospital for ulcer -7/10 Per [facility] scheduled for formal debridement of the lateral foot ulcer in the OR tomorrow AM followed by application of wound vac and antibiotic beads.     HTN (hypertension) -appears to be well controlled. Continue home medication.  -- hold her lisinopril if creatinine rises     DM2 (diabetes mellitus, type 2) (HCC) -7/8 hemoglobin A1c= 8.8 -currently on trulicity and humalog, amaryl, farxiga -a1c pending and discussed need for better glycemic control with chronic wound -hold trulicity, had her weekly dose today, hold amaryl  continue humalog and farxiga -SSI and accuchecks     Elevated LFTs -no hx of elevated LFTs. IVF overnight and repeat in AM. -continues to be elevated would check US/labs -? Side effect from HIV meds, but no hx of elevated LFTs in past.     Acute-on-CKD stage III (baseline Cr 1.4) -appears a little dry, likely pre renal. -light IVF overnight and trend. Her baseline has varied from 1.01-1.71 over past year, but average is around 1.4. -avoid NSAIDs and ACEi. Has NSAID in med list, will need to educate not to use these -her lisinopril-hctz is ordered for tomorrow, will need to hold if rise in creatinine Lab Results  Component Value Date   CREATININE 1.48 (H)  06/26/2021   CREATININE 1.66 (H) 06/25/2021   CREATININE 1.80 (H) 06/24/2021  -Baseline     HIV (human immunodeficiency virus infection) ([place]) -last viral load undetectable in June of 2022 -follows regularly with Dr. Baxter Flattery, ID. -continue her Biktarvy, watch LFTs.      HLD (hyperlipidemia)  -lipid panel 05/2021 with LDL of 78 -continue home statin   -7/9 lipid panel pending   DVT prophylaxis:  Lovenox Code Status:  Family Communication:  Status is: Inpatient    Dispo: The patient is from: Home              Anticipated d/c is to: Home vs SNF              Anticipated d/c date is: Per orthopedic surgery              Patient currently unstable      Consultants:  Orthopedic surgery  Procedures/Significant Events:  7/9 MRI LEFT foot W0 contrast; not read   I have personally reviewed and interpreted all radiology studies and my findings are as above.  VENTILATOR SETTINGS:    Cultures   Antimicrobials: Anti-infectives (From admission, onward)    Start     Ordered Stop   06/25/21 1900  vancomycin (VANCOCIN) IVPB 1000 mg/200 mL premix  Status:  Discontinued        06/24/21 1656 06/25/21 1002   06/25/21 1900  vancomycin (VANCOREADY) IVPB 1250 mg/250 mL        06/25/21 1002     06/25/21 0000  cefTRIAXone (ROCEPHIN) 2 g in sodium chloride 0.9 % 100 mL IVPB        06/24/21 1832     06/24/21 2200  bictegravir-emtricitabine-tenofovir AF (BIKTARVY) 50-200-25 MG per tablet 1 tablet        06/24/21 1921     06/24/21 1700  vancomycin (VANCOREADY) IVPB 1750 mg/350 mL        06/24/21 1656 06/24/21 2050   06/24/21 1630  piperacillin-tazobactam (ZOSYN) IVPB 3.375 g        06/24/21 1629 06/24/21 1743         Devices    LINES / TUBES:      Continuous Infusions:  cefTRIAXone (ROCEPHIN)  IV 2 g (06/26/21 0021)   vancomycin 1,250 mg (06/26/21 1943)     Objective: Vitals:   06/26/21 0750 06/26/21 1146 06/26/21 1500 06/26/21 1941  BP: (!) 104/57 (!) 150/87  122/63  Pulse: 77 87  89  Resp: 18 18  17   Temp: 98.1 F (36.7 C) 99.1 F (37.3 C)  98.8 F (37.1 C)  TempSrc: Oral Oral  Oral  SpO2: 97% 98%  97%  Weight:      Height:   5' 9.5" (1.765 m)     Intake/Output Summary (Last 24 hours) at 06/26/2021 2003 Last data filed at 06/25/2021 2241 Gross per 24 hour  Intake 240 ml  Output --  Net 240 ml    Filed Weights   06/24/21 1929  Weight: 125.2 kg     Examination:  General: A/O x4, No acute respiratory distress Eyes: negative scleral hemorrhage, negative anisocoria, negative icterus ENT: Negative Runny nose, negative gingival bleeding, Neck:  Negative scars, masses, torticollis, lymphadenopathy, JVD Lungs: Clear to auscultation bilaterally without wheezes or crackles Cardiovascular: Regular rate and rhythm without murmur gallop or rub normal S1 and S2 Abdomen: negative abdominal pain, nondistended, positive soft, bowel sounds, no rebound, no ascites, no appreciable mass Extremities: left foot: large, open ulcer on lateral proximal  calcaneus. Skin: Negative rashes, lesions, ulcers Psychiatric:  Negative depression, negative anxiety, negative fatigue, negative mania  Central nervous system:  Cranial nerves II through XII intact, tongue/uvula midline, all extremities muscle strength 5/5, sensation intact throughout, negative dysarthria, negative expressive aphasia, negative receptive aphasia.  .     Data Reviewed: Care during the described time interval was provided by me .  I have reviewed this patient's available data, including medical history, events of note, physical examination, and all test results as part of my evaluation.  CBC: Recent Labs  Lab 06/24/21 1107 06/25/21 0147 06/26/21 0032  WBC 16.5* 14.6* 12.1*  NEUTROABS 11.9*  --  8.7*  HGB 15.9* 14.3 13.8  HCT 49.7* 42.3 40.9  MCV 96.5 93.0 93.8  PLT 555* 420* 435*    Basic Metabolic Panel: Recent Labs  Lab 06/24/21 1107 06/25/21 0147 06/26/21 0032  NA 137 137 135  K 3.7 3.6 4.1  CL 106 107 104  CO2 19* 20* 22  GLUCOSE 83 82 188*  BUN 22* 19 13  CREATININE 1.80* 1.66* 1.48*  CALCIUM 9.1 8.7* 8.7*  MG  --   --  1.9  PHOS  --   --  3.2    GFR: Estimated Creatinine Clearance: 62.1 mL/min (A) (by C-G formula based on SCr of 1.48 mg/dL (H)). Liver Function Tests: Recent Labs  Lab 06/24/21 1107 06/25/21 0147 06/26/21 0032  AST 81* 119* 138*  ALT  145* 145* 171*  ALKPHOS 38 35* 36*  BILITOT 0.8 0.7 0.7  PROT 8.0 7.0 6.7  ALBUMIN 3.5 2.9* 2.7*    No results for input(s): LIPASE, AMYLASE in the last 168 hours. No results for input(s): AMMONIA in the last 168 hours. Coagulation Profile: No results for input(s): INR, PROTIME in the last 168 hours. Cardiac Enzymes: No results for input(s): CKTOTAL, CKMB, CKMBINDEX, TROPONINI in the last 168 hours. BNP (last 3 results) No results for input(s): PROBNP in the last 8760 hours. HbA1C: Recent Labs    06/24/21 1107  HGBA1C 8.8*    CBG: Recent Labs  Lab 06/25/21 1716 06/25/21 2143 06/26/21 0626 06/26/21 1145 06/26/21 1633  GLUCAP 106* 186* 93 70 102*    Lipid Profile: No results for input(s): CHOL, HDL, LDLCALC, TRIG, CHOLHDL, LDLDIRECT in the last 72 hours. Thyroid Function Tests: No results for input(s): TSH, T4TOTAL, FREET4, T3FREE, THYROIDAB in the last 72 hours. Anemia Panel: No results for input(s): VITAMINB12, FOLATE, FERRITIN, TIBC, IRON, RETICCTPCT in the last 72 hours. Sepsis Labs: Recent Labs  Lab 06/24/21 1648 06/25/21 2038 06/26/21 0032  PROCALCITON  --  <0.10 <0.10  LATICACIDVEN 1.3 1.2 1.1     Recent Results (from the past 240 hour(s))  Blood culture (routine x 2)     Status: None (Preliminary result)   Collection Time: 06/24/21  4:29 PM   Specimen: BLOOD  Result Value Ref Range Status   Specimen Description BLOOD LEFT ANTECUBITAL  Final   Special Requests   Final    BOTTLES DRAWN AEROBIC AND ANAEROBIC Blood Culture adequate volume   Culture   Final    NO GROWTH 2 DAYS Performed at [facility], [address], [place],  [address]    Report Status PENDING  Incomplete  Resp Panel by RT-PCR (Flu A&B, Covid) Nasopharyngeal Swab     Status: None   Collection Time: 06/24/21  4:29 PM   Specimen: Nasopharyngeal Swab; Nasopharyngeal(NP) swabs in vial transport medium  Result Value Ref Range Status   SARS  Coronavirus 2 by RT PCR NEGATIVE  NEGATIVE Final    Comment: (NOTE) SARS-CoV-2 target nucleic acids are NOT DETECTED.  The SARS-CoV-2 RNA is generally detectable in upper respiratory specimens during the acute phase of infection. The lowest concentration of SARS-CoV-2 viral copies this assay can detect is 138 copies/mL. A negative result does not preclude SARS-Cov-2 infection and should not be used as the sole basis for treatment or other patient management decisions. A negative result may occur with  improper specimen collection/handling, submission of specimen other than nasopharyngeal swab, presence of viral mutation(s) within the areas targeted by this assay, and inadequate number of viral copies(<138 copies/mL). A negative result must be combined with clinical observations, patient history, and epidemiological information. The expected result is Negative.  Fact Sheet for Patients:  EntrepreneurPulse.com.au  Fact Sheet for Healthcare Providers:  IncredibleEmployment.be  This test is no t yet approved or cleared by the [place] FDA and  has been authorized for detection and/or diagnosis of SARS-CoV-2 by FDA under an Emergency Use Authorization (EUA). This EUA will remain  in effect (meaning this test can be used) for the duration of the COVID-19 declaration under Section 564(b)(1) of the Act, 21 U.S.C.section 360bbb-3(b)(1), unless the authorization is terminated  or revoked sooner.       Influenza A by PCR NEGATIVE NEGATIVE Final   Influenza B by PCR NEGATIVE NEGATIVE Final    Comment: (NOTE) The Xpert Xpress SARS-CoV-2/FLU/RSV plus assay is intended as an aid in the diagnosis of influenza from Nasopharyngeal swab specimens and should not be used as a sole basis for treatment. Nasal washings and aspirates are unacceptable for Xpert Xpress SARS-CoV-2/FLU/RSV testing.  Fact Sheet for Patients: EntrepreneurPulse.com.au  Fact Sheet for Healthcare  Providers: IncredibleEmployment.be  This test is not yet approved or cleared by the [place] FDA and has been authorized for detection and/or diagnosis of SARS-CoV-2 by FDA under an Emergency Use Authorization (EUA). This EUA will remain in effect (meaning this test can be used) for the duration of the COVID-19 declaration under Section 564(b)(1) of the Act, 21 U.S.C. section 360bbb-3(b)(1), unless the authorization is terminated or revoked.  Performed at [facility], [place] [address] [place], [place] [address]   Blood culture (routine x 2)     Status: None (Preliminary result)   Collection Time: 06/24/21  4:34 PM   Specimen: BLOOD  Result Value Ref Range Status   Specimen Description BLOOD SITE NOT SPECIFIED  Final   Special Requests   Final    BOTTLES DRAWN AEROBIC AND ANAEROBIC Blood Culture results may not be optimal due to an inadequate volume of blood received in culture bottles   Culture   Final    NO GROWTH 2 DAYS Performed at [facility], [place] [address] [place], [place] [address]    Report Status PENDING  Incomplete  Surgical PCR screen     Status: None   Collection Time: 06/26/21  4:16 PM   Specimen: Nasal Mucosa; Nasal Swab  Result Value Ref Range Status   MRSA, PCR NEGATIVE NEGATIVE Final   Staphylococcus aureus NEGATIVE NEGATIVE Final    Comment: (NOTE) The Xpert SA Assay (FDA approved for NASAL specimens in patients 66 years of age and older), is one component of a comprehensive surveillance program. It is not intended to diagnose infection nor to guide or monitor treatment. Performed at [facility], [place] [address], [place], [place] [address]  Radiology Studies: MR FOOT LEFT WO CONTRAST  Result Date: 06/26/2021 CLINICAL DATA:  Diabetic patient with a chronic skin ulceration at the base of the fifth metatarsal. History of prior fifth metatarsal fracture with subsequent infection fracture fixation  hardware. EXAM: MRI OF THE LEFT FOOT WITHOUT CONTRAST TECHNIQUE: Multiplanar, multisequence MR imaging of the left foot was performed. No intravenous contrast was administered. COMPARISON:  Plain films left foot 03/01/2019 and 06/24/2021. FINDINGS: Bones/Joint/Cartilage Remote fracture of the base of the fifth metatarsal is again seen. Subjacent to a skin wound, there is an ununited fracture fragment measuring 2.1 cm long by 1.5 cm transverse by 1.5 cm craniocaudal. There is marrow edema within this fragment. Marrow edema is also seen in the proximal 1.5 cm of the fifth metatarsal shaft. Cortical thickening in the fifth metatarsal shaft as seen on prior plain films is noted. Ligaments Negative. Muscles and Tendons No intramuscular fluid collection.  No tendon tear. Soft tissues Skin ulceration is again noted.  No underlying abscess. 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 Marrow edema in an ununited fracture fragment at the base of the fifth metatarsal and adjacent 1.5 cm of the fifth metatarsal shaft is worrisome for osteomyelitis. There is an overlying skin wound but no abscess. Electronically Signed   By: Inge Rise M.D.   On: 06/26/2021 09:11        Scheduled Meds:  amLODipine  5 mg Oral q AM   aspirin EC  81 mg Oral q AM   atorvastatin  10 mg Oral Daily   bictegravir-emtricitabine-tenofovir AF  1 tablet Oral QHS   dapagliflozin propanediol  10 mg Oral q AM   enoxaparin (LOVENOX) injection  40 mg Subcutaneous Q24H   gabapentin  100 mg Oral QID   hydrochlorothiazide  25 mg Oral Daily   insulin aspart  0-9 Units Subcutaneous TID WC   insulin aspart protamine- aspart  60 Units Subcutaneous 2 times per day   lisinopril  20 mg Oral Daily   mupirocin ointment  1 application Nasal BID   norethindrone  5 mg Oral BID   Continuous Infusions:  cefTRIAXone (ROCEPHIN)  IV 2 g (06/26/21 0021)   vancomycin 1,250 mg (06/26/21 1943)     LOS: 2 days    Time  spent:40 min    Chelisa Hennen, Geraldo Docker, MD [facility]   If 7PM-7AM, please contact night-coverage 06/26/2021, 8:03 PM

## 2021-06-26 NOTE — Plan of Care (Signed)
  Problem: Pain Managment: Goal: General experience of comfort will improve Outcome: Progressing   Problem: Safety: Goal: Ability to remain free from injury will improve Outcome: Progressing   

## 2021-06-27 ENCOUNTER — Encounter (HOSPITAL_COMMUNITY)
Admission: EM | Disposition: A | Discharge status: Home Health | Payer: Self-pay | Source: Home / Self Care | Attending: Internal Medicine

## 2021-06-27 ENCOUNTER — Inpatient Hospital Stay (HOSPITAL_COMMUNITY): Payer: Medicare Other | Admitting: Anesthesiology

## 2021-06-27 ENCOUNTER — Encounter (HOSPITAL_COMMUNITY): Payer: Self-pay | Admitting: Family Medicine

## 2021-06-27 DIAGNOSIS — E08621 Diabetes mellitus due to underlying condition with foot ulcer: Secondary | ICD-10-CM

## 2021-06-27 DIAGNOSIS — M86472 Chronic osteomyelitis with draining sinus, left ankle and foot: Secondary | ICD-10-CM

## 2021-06-27 DIAGNOSIS — L97404 Non-pressure chronic ulcer of unspecified heel and midfoot with necrosis of bone: Secondary | ICD-10-CM

## 2021-06-27 HISTORY — PX: I & D EXTREMITY: SHX5045

## 2021-06-27 LAB — LIPID PANEL
Cholesterol: 118 mg/dL (ref 0–200)
HDL: 29 mg/dL — ABNORMAL LOW (ref >40)
LDL Cholesterol: 65 mg/dL (ref 0–99)
Total CHOL/HDL Ratio: 4.1 RATIO
Triglycerides: 122 mg/dL (ref <150)
VLDL: 24 mg/dL (ref 0–40)

## 2021-06-27 LAB — CBC WITH DIFFERENTIAL/PLATELET
Abs Immature Granulocytes: 0.06 10*3/uL (ref 0.00–0.07)
Basophils Absolute: 0 10*3/uL (ref 0.0–0.1)
Basophils Relative: 0 %
Eosinophils Absolute: 0.3 10*3/uL (ref 0.0–0.5)
Eosinophils Relative: 2 %
HCT: 44.1 % (ref 36.0–46.0)
Hemoglobin: 14.6 g/dL (ref 12.0–15.0)
Immature Granulocytes: 0 %
Lymphocytes Relative: 36 %
Lymphs Abs: 5.5 10*3/uL — ABNORMAL HIGH (ref 0.7–4.0)
MCH: 31.3 pg (ref 26.0–34.0)
MCHC: 33.1 g/dL (ref 30.0–36.0)
MCV: 94.6 fL (ref 80.0–100.0)
Monocytes Absolute: 1.5 10*3/uL — ABNORMAL HIGH (ref 0.1–1.0)
Monocytes Relative: 10 %
Neutro Abs: 7.9 10*3/uL — ABNORMAL HIGH (ref 1.7–7.7)
Neutrophils Relative %: 52 %
Platelets: 475 10*3/uL — ABNORMAL HIGH (ref 150–400)
RBC: 4.66 MIL/uL (ref 3.87–5.11)
RDW: 13.4 % (ref 11.5–15.5)
WBC: 15.3 10*3/uL — ABNORMAL HIGH (ref 4.0–10.5)
nRBC: 0 % (ref 0.0–0.2)

## 2021-06-27 LAB — MAGNESIUM: Magnesium: 2 mg/dL (ref 1.7–2.4)

## 2021-06-27 LAB — GLUCOSE, CAPILLARY
Glucose-Capillary: 64 mg/dL — ABNORMAL LOW (ref 70–99)
Glucose-Capillary: 57 mg/dL — ABNORMAL LOW (ref 70–99)
Glucose-Capillary: 61 mg/dL — ABNORMAL LOW (ref 70–99)
Glucose-Capillary: 149 mg/dL — ABNORMAL HIGH (ref 70–99)
Glucose-Capillary: 177 mg/dL — ABNORMAL HIGH (ref 70–99)
Glucose-Capillary: 159 mg/dL — ABNORMAL HIGH (ref 70–99)
Glucose-Capillary: 217 mg/dL — ABNORMAL HIGH (ref 70–99)

## 2021-06-27 LAB — CBC
HCT: 45.4 % (ref 36.0–46.0)
Hemoglobin: 14.9 g/dL (ref 12.0–15.0)
MCH: 30.8 pg (ref 26.0–34.0)
MCHC: 32.8 g/dL (ref 30.0–36.0)
MCV: 94 fL (ref 80.0–100.0)
Platelets: 477 10*3/uL — ABNORMAL HIGH (ref 150–400)
RBC: 4.83 MIL/uL (ref 3.87–5.11)
RDW: 13.3 % (ref 11.5–15.5)
WBC: 11.1 10*3/uL — ABNORMAL HIGH (ref 4.0–10.5)
nRBC: 0 % (ref 0.0–0.2)

## 2021-06-27 LAB — COMPREHENSIVE METABOLIC PANEL
ALT: 257 U/L — ABNORMAL HIGH (ref 0–44)
AST: 183 U/L — ABNORMAL HIGH (ref 15–41)
Albumin: 2.9 g/dL — ABNORMAL LOW (ref 3.5–5.0)
Alkaline Phosphatase: 39 U/L (ref 38–126)
Anion gap: 10 (ref 5–15)
BUN: 13 mg/dL (ref 6–20)
CO2: 20 mmol/L — ABNORMAL LOW (ref 22–32)
Calcium: 9 mg/dL (ref 8.9–10.3)
Chloride: 106 mmol/L (ref 98–111)
Creatinine, Ser: 1.54 mg/dL — ABNORMAL HIGH (ref 0.44–1.00)
GFR, Estimated: 40 mL/min — ABNORMAL LOW (ref >60)
Glucose, Bld: 50 mg/dL — ABNORMAL LOW (ref 70–99)
Potassium: 3.5 mmol/L (ref 3.5–5.1)
Sodium: 136 mmol/L (ref 135–145)
Total Bilirubin: 0.5 mg/dL (ref 0.3–1.2)
Total Protein: 6.9 g/dL (ref 6.5–8.1)

## 2021-06-27 LAB — PROCALCITONIN: Procalcitonin: <0.1 ng/mL

## 2021-06-27 LAB — POCT PREGNANCY, URINE: Preg Test, Ur: NEGATIVE

## 2021-06-27 LAB — PHOSPHORUS: Phosphorus: 2.3 mg/dL — ABNORMAL LOW (ref 2.5–4.6)

## 2021-06-27 SURGERY — IRRIGATION AND DEBRIDEMENT EXTREMITY
Anesthesia: General | Site: Foot | Laterality: Left

## 2021-06-27 SURGERY — IRRIGATION AND DEBRIDEMENT WOUND
Anesthesia: Choice | Laterality: Left

## 2021-06-27 MED ORDER — POLYETHYLENE GLYCOL 3350 17 G PO PACK: 17.0000 g | PACK | Freq: Every day | ORAL | Status: DC | PRN

## 2021-06-27 MED ORDER — EPHEDRINE 5 MG/ML INJ
INTRAVENOUS | Status: AC
  Filled 2021-06-27: qty 10

## 2021-06-27 MED ORDER — DOCUSATE SODIUM 100 MG PO CAPS
100.0000 mg | ORAL_CAPSULE | Freq: Two times a day (BID) | ORAL | Status: DC
  Administered 2021-06-27 – 2021-07-04 (×13): 100 mg via ORAL
  Filled 2021-06-27 (×13): qty 1

## 2021-06-27 MED ORDER — ONDANSETRON HCL 4 MG/2ML IJ SOLN: 4.0000 mg | Freq: Four times a day (QID) | INTRAMUSCULAR | Status: DC | PRN

## 2021-06-27 MED ORDER — PHENYLEPHRINE HCL-NACL 10-0.9 MG/250ML-% IV SOLN
INTRAVENOUS | Status: AC
  Filled 2021-06-27: qty 250

## 2021-06-27 MED ORDER — PHENYLEPHRINE HCL (PRESSORS) 10 MG/ML IV SOLN
INTRAVENOUS | Status: DC | PRN
  Administered 2021-06-27 (×4): 100 ug via INTRAVENOUS

## 2021-06-27 MED ORDER — ACETAMINOPHEN 500 MG PO TABS
1000.0000 mg | ORAL_TABLET | Freq: Once | ORAL | Status: AC
  Administered 2021-06-27: 1000 mg via ORAL
  Filled 2021-06-27: qty 2

## 2021-06-27 MED ORDER — PHENYLEPHRINE HCL-NACL 10-0.9 MG/250ML-% IV SOLN
INTRAVENOUS | Status: DC | PRN
  Administered 2021-06-27: 25 ug/min via INTRAVENOUS

## 2021-06-27 MED ORDER — PROMETHAZINE HCL 25 MG/ML IJ SOLN: 6.2500 mg | INTRAMUSCULAR | Status: DC | PRN

## 2021-06-27 MED ORDER — LIDOCAINE 2% (20 MG/ML) 5 ML SYRINGE
INTRAMUSCULAR | Status: AC
  Filled 2021-06-27: qty 5

## 2021-06-27 MED ORDER — LACTATED RINGERS IV SOLN: INTRAVENOUS | Status: DC | PRN

## 2021-06-27 MED ORDER — ACETAMINOPHEN 325 MG PO TABS: 325.0000 mg | ORAL_TABLET | Freq: Four times a day (QID) | ORAL | Status: DC | PRN

## 2021-06-27 MED ORDER — METOCLOPRAMIDE HCL 5 MG PO TABS: 5.0000 mg | ORAL_TABLET | Freq: Three times a day (TID) | ORAL | Status: DC | PRN

## 2021-06-27 MED ORDER — MIDAZOLAM HCL 2 MG/2ML IJ SOLN
INTRAMUSCULAR | Status: AC
  Filled 2021-06-27: qty 2

## 2021-06-27 MED ORDER — TOBRAMYCIN SULFATE 1.2 G IJ SOLR
INTRAMUSCULAR | Status: DC | PRN
  Administered 2021-06-27: 1.2 g via TOPICAL

## 2021-06-27 MED ORDER — HYDROCODONE-ACETAMINOPHEN 7.5-325 MG PO TABS: 1.0000 | ORAL_TABLET | ORAL | Status: DC | PRN

## 2021-06-27 MED ORDER — PHENYLEPHRINE 40 MCG/ML (10ML) SYRINGE FOR IV PUSH (FOR BLOOD PRESSURE SUPPORT)
PREFILLED_SYRINGE | INTRAVENOUS | Status: AC
  Filled 2021-06-27: qty 10

## 2021-06-27 MED ORDER — DEXTROSE 50 % IV SOLN
INTRAVENOUS | Status: AC
  Administered 2021-06-27: 50 mL
  Filled 2021-06-27: qty 50

## 2021-06-27 MED ORDER — PROPOFOL 10 MG/ML IV BOLUS
INTRAVENOUS | Status: AC
  Filled 2021-06-27: qty 20

## 2021-06-27 MED ORDER — PROPOFOL 1000 MG/100ML IV EMUL
INTRAVENOUS | Status: AC
  Filled 2021-06-27: qty 100

## 2021-06-27 MED ORDER — GLUCOSE 40 % PO GEL
ORAL | Status: AC
  Filled 2021-06-27: qty 1.21

## 2021-06-27 MED ORDER — LIDOCAINE HCL (CARDIAC) PF 100 MG/5ML IV SOSY
PREFILLED_SYRINGE | INTRAVENOUS | Status: DC | PRN
  Administered 2021-06-27: 100 mg via INTRAVENOUS

## 2021-06-27 MED ORDER — BISACODYL 5 MG PO TBEC: 5.0000 mg | DELAYED_RELEASE_TABLET | Freq: Every day | ORAL | Status: DC | PRN

## 2021-06-27 MED ORDER — ONDANSETRON HCL 4 MG/2ML IJ SOLN
INTRAMUSCULAR | Status: AC
  Filled 2021-06-27: qty 2

## 2021-06-27 MED ORDER — CHLORHEXIDINE GLUCONATE 0.12 % MT SOLN
15.0000 mL | Freq: Once | OROMUCOSAL | Status: AC
  Administered 2021-06-27: 15 mL via OROMUCOSAL
  Filled 2021-06-27: qty 15

## 2021-06-27 MED ORDER — SODIUM CHLORIDE 0.9 % IV SOLN: INTRAVENOUS | Status: DC

## 2021-06-27 MED ORDER — PROPOFOL 10 MG/ML IV BOLUS
INTRAVENOUS | Status: DC | PRN
  Administered 2021-06-27: 150 mg via INTRAVENOUS

## 2021-06-27 MED ORDER — MORPHINE SULFATE (PF) 2 MG/ML IV SOLN: 0.5000 mg | INTRAVENOUS | Status: DC | PRN

## 2021-06-27 MED ORDER — METOCLOPRAMIDE HCL 5 MG/ML IJ SOLN: 5.0000 mg | Freq: Three times a day (TID) | INTRAMUSCULAR | Status: DC | PRN

## 2021-06-27 MED ORDER — GLUCOSE 40 % PO GEL
1.0000 | Freq: Once | ORAL | Status: DC
  Filled 2021-06-27: qty 1

## 2021-06-27 MED ORDER — HYDROCODONE-ACETAMINOPHEN 5-325 MG PO TABS
1.0000 | ORAL_TABLET | ORAL | Status: DC | PRN
Start: 2021-06-27 — End: 2021-07-04
  Administered 2021-06-29: 1 via ORAL
  Filled 2021-06-27: qty 1

## 2021-06-27 MED ORDER — ONDANSETRON HCL 4 MG PO TABS: 4.0000 mg | ORAL_TABLET | Freq: Four times a day (QID) | ORAL | Status: DC | PRN

## 2021-06-27 MED ORDER — FENTANYL CITRATE (PF) 100 MCG/2ML IJ SOLN
INTRAMUSCULAR | Status: DC | PRN
  Administered 2021-06-27: 50 ug via INTRAVENOUS
  Administered 2021-06-27: 100 ug via INTRAVENOUS
  Administered 2021-06-27: 50 ug via INTRAVENOUS

## 2021-06-27 MED ORDER — FLEET ENEMA 7-19 GM/118ML RE ENEM: 1.0000 | ENEMA | Freq: Once | RECTAL | Status: DC | PRN

## 2021-06-27 MED ORDER — ORAL CARE MOUTH RINSE: 15.0000 mL | Freq: Once | OROMUCOSAL | Status: AC

## 2021-06-27 MED ORDER — FENTANYL CITRATE (PF) 250 MCG/5ML IJ SOLN
INTRAMUSCULAR | Status: AC
  Filled 2021-06-27: qty 5

## 2021-06-27 MED ORDER — GABAPENTIN 300 MG PO CAPS
300.0000 mg | ORAL_CAPSULE | Freq: Once | ORAL | Status: AC
  Administered 2021-06-27: 300 mg via ORAL
  Filled 2021-06-27: qty 1

## 2021-06-27 MED ORDER — BUPIVACAINE HCL (PF) 0.5 % IJ SOLN
INTRAMUSCULAR | Status: AC
  Filled 2021-06-27: qty 30

## 2021-06-27 MED ORDER — ENSURE PRE-SURGERY PO LIQD
296.0000 mL | Freq: Once | ORAL | Status: DC
  Filled 2021-06-27: qty 296

## 2021-06-27 MED ORDER — TOBRAMYCIN SULFATE 1.2 G IJ SOLR
INTRAMUSCULAR | Status: AC
  Filled 2021-06-27: qty 1.2

## 2021-06-27 MED ORDER — FENTANYL CITRATE (PF) 100 MCG/2ML IJ SOLN: 25.0000 ug | INTRAMUSCULAR | Status: DC | PRN

## 2021-06-27 MED ORDER — MIDAZOLAM HCL 5 MG/5ML IJ SOLN
INTRAMUSCULAR | Status: DC | PRN
  Administered 2021-06-27: 2 mg via INTRAVENOUS

## 2021-06-27 MED ORDER — TRAMADOL HCL 50 MG PO TABS
50.0000 mg | ORAL_TABLET | Freq: Four times a day (QID) | ORAL | Status: DC
  Administered 2021-06-27 – 2021-07-04 (×26): 50 mg via ORAL
  Filled 2021-06-27 (×27): qty 1

## 2021-06-27 MED ORDER — LACTATED RINGERS IV SOLN: INTRAVENOUS | Status: DC

## 2021-06-27 SURGICAL SUPPLY — 58 items
BAG COUNTER SPONGE SURGICOUNT (BAG) ×2 IMPLANT
BAG DECANTER FOR FLEXI CONT (MISCELLANEOUS) ×3 IMPLANT
BAG SPNG CNTER NS LX DISP (BAG) ×1
BAG SURGICOUNT SPONGE COUNTING (BAG) ×1
BANDAGE ESMARK 6X9 LF (GAUZE/BANDAGES/DRESSINGS) ×1 IMPLANT
BNDG CMPR 9X6 STRL LF SNTH (GAUZE/BANDAGES/DRESSINGS) ×1
BNDG COHESIVE 4X5 TAN STRL (GAUZE/BANDAGES/DRESSINGS) ×3 IMPLANT
BNDG ELASTIC 3X5.8 VLCR STR LF (GAUZE/BANDAGES/DRESSINGS) ×3 IMPLANT
BNDG ELASTIC 4X5.8 VLCR STR LF (GAUZE/BANDAGES/DRESSINGS) ×3 IMPLANT
BNDG ESMARK 6X9 LF (GAUZE/BANDAGES/DRESSINGS) ×3
BNDG GAUZE ELAST 4 BULKY (GAUZE/BANDAGES/DRESSINGS) ×3 IMPLANT
CANISTER WOUNDNEG PRESSURE 500 (CANNISTER) ×3 IMPLANT
CUFF TOURN SGL QUICK 18X4 (TOURNIQUET CUFF) ×3 IMPLANT
CUFF TOURN SGL QUICK 24 (TOURNIQUET CUFF)
CUFF TOURN SGL QUICK 34 (TOURNIQUET CUFF)
CUFF TOURN SGL QUICK 42 (TOURNIQUET CUFF) IMPLANT
CUFF TRNQT CYL 24X4X16.5-23 (TOURNIQUET CUFF) IMPLANT
CUFF TRNQT CYL 34X4.125X (TOURNIQUET CUFF) IMPLANT
DRAPE DERMATAC (DRAPES) ×3 IMPLANT
DRESSING PEEL AND PLC PRVNA 13 (GAUZE/BANDAGES/DRESSINGS) ×2 IMPLANT
DRSG ADAPTIC 3X8 NADH LF (GAUZE/BANDAGES/DRESSINGS) ×3 IMPLANT
DRSG EMULSION OIL 3X3 NADH (GAUZE/BANDAGES/DRESSINGS) ×3 IMPLANT
DRSG PAD ABDOMINAL 8X10 ST (GAUZE/BANDAGES/DRESSINGS) ×3 IMPLANT
DRSG PEEL AND PLACE PREVENA 13 (GAUZE/BANDAGES/DRESSINGS) ×6
ELECT REM PT RETURN 9FT ADLT (ELECTROSURGICAL) ×3
ELECTRODE REM PT RTRN 9FT ADLT (ELECTROSURGICAL) ×1 IMPLANT
GAUZE SPONGE 4X4 12PLY STRL (GAUZE/BANDAGES/DRESSINGS) ×3 IMPLANT
GLOVE SRG 8 PF TXTR STRL LF DI (GLOVE) ×1 IMPLANT
GLOVE SURG 8.5 LATEX PF (GLOVE) ×3 IMPLANT
GLOVE SURG LTX SZ9 (GLOVE) ×3 IMPLANT
GLOVE SURG ORTHO LTX SZ7.5 (GLOVE) ×3 IMPLANT
GLOVE SURG UNDER POLY LF SZ8 (GLOVE) ×3
GOWN STRL REUS W/ TWL LRG LVL3 (GOWN DISPOSABLE) ×1 IMPLANT
GOWN STRL REUS W/TWL 2XL LVL3 (GOWN DISPOSABLE) ×6 IMPLANT
GOWN STRL REUS W/TWL LRG LVL3 (GOWN DISPOSABLE) ×3
KIT BASIN OR (CUSTOM PROCEDURE TRAY) ×3 IMPLANT
KIT DRSG PREVENA PLUS 7DAY 125 (MISCELLANEOUS) ×3 IMPLANT
KIT STIMULAN RAPID CURE 5CC (Orthopedic Implant) ×3 IMPLANT
KIT TURNOVER KIT B (KITS) ×3 IMPLANT
MANIFOLD NEPTUNE II (INSTRUMENTS) ×3 IMPLANT
NS IRRIG 1000ML POUR BTL (IV SOLUTION) ×3 IMPLANT
PACK ORTHO EXTREMITY (CUSTOM PROCEDURE TRAY) ×3 IMPLANT
PAD ARMBOARD 7.5X6 YLW CONV (MISCELLANEOUS) ×6 IMPLANT
PENCIL BUTTON HOLSTER BLD 10FT (ELECTRODE) ×3 IMPLANT
SPONGE T-LAP 18X18 ~~LOC~~+RFID (SPONGE) ×3 IMPLANT
SPONGE T-LAP 4X18 ~~LOC~~+RFID (SPONGE) ×3 IMPLANT
STOCKINETTE IMPERVIOUS 9X36 MD (GAUZE/BANDAGES/DRESSINGS) ×3 IMPLANT
SUT PROLENE 2 0 CT2 30 (SUTURE) ×3 IMPLANT
SWAB CULTURE ESWAB REG 1ML (MISCELLANEOUS) ×3 IMPLANT
SWAB CULTURE LIQ STUART DBL (MISCELLANEOUS) ×3 IMPLANT
SWAB CULTURE LIQUID MINI MALE (MISCELLANEOUS) ×3 IMPLANT
TOWEL GREEN STERILE (TOWEL DISPOSABLE) ×3 IMPLANT
TOWEL GREEN STERILE FF (TOWEL DISPOSABLE) ×3 IMPLANT
TUBE CONNECTING 12'X1/4 (SUCTIONS) ×1
TUBE CONNECTING 12X1/4 (SUCTIONS) ×2 IMPLANT
UNDERPAD 30X36 HEAVY ABSORB (UNDERPADS AND DIAPERS) ×3 IMPLANT
WATER STERILE IRR 1000ML POUR (IV SOLUTION) ×3 IMPLANT
YANKAUER SUCT BULB TIP NO VENT (SUCTIONS) ×3 IMPLANT

## 2021-06-27 NOTE — Plan of Care (Signed)
  Problem: Pain Managment: Goal: General experience of comfort will improve Outcome: Progressing   Problem: Safety: Goal: Ability to remain free from injury will improve Outcome: Progressing   

## 2021-06-27 NOTE — Progress Notes (Signed)
PROGRESS NOTE    Katrina Wolfe  SEG:[id number] DOB: [birth year] DOA: 06/24/2021 PCP: Wenda Low, MD     Brief Narrative:   55 y.o. 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 She has a history of an ORIF of the fifth metatarsal with subsequent removal of hardware secondary to infection. She states she has been feeling bad for the past week with nausea, chills, headaches and more drainage from her ulcer that has a foul odor. She has also had more pain in theThe odor is new and she states she knows something is wrong when it smells this way. She denies any fevers, cough, shortness of breath, chest pain, diarrhea or vomiting. She has some discomfort in her left lower abdomen, but no pain. She has been compliant with her medication.      ED Course: vitals: bp: 134/82, HR: 70, afebrile, RR: 18, oxygen: 100% room air. Labs pertinent for elevated WBC to 16.5, elevated LFTS and creatinine of 1.80. xray with chronic osteomyelitis of left foot. Asked to admit.      Subjective: 7/11 afebrile overnight   afebrile overnight,   Assessment & Plan: Covid vaccination; unvaccinated does not want vaccination   Principal Problem:   Osteomyelitis of left foot ([place]) Active Problems:   HTN (hypertension)   HIV (human immunodeficiency virus infection) ([place])   HLD (hyperlipidemia)   Elevated LFTs   Acute-on-chronic kidney injury ([place])   Diabetes mellitus type 2, uncontrolled, with complications ([place])   Diabetic nephropathy ([place])   CKD (chronic kidney disease), stage III ([place])  Osteomyelitis of left foot ([place]) -has a history of an ORIF of the fifth metatarsal with subsequent removal of hardware secondary to infection. Routinely followed at [facility], Dr. Sharol Given for debridement of her chronic ulcer. -Elevated WBC here. Xray  read as chronic osteo, I called radiology and discussed, but they have no access to see prior films for interval change reporting. -blood cx pending -received vanc and zosyn in ER. -continue empiric therapy with vanc and rocephin  -MRI ordered left foot -wound care consult while in hospital for ulcer -7/10 Per [facility] scheduled for formal debridement of the lateral foot ulcer in the OR tomorrow AM followed by application of wound vac and antibiotic beads.     HTN (hypertension) -appears to be well controlled. Continue home medication.  -- hold her lisinopril if creatinine rises     DM2 (diabetes mellitus, type 2) (HCC) -7/8 hemoglobin A1c= 8.8 -currently on trulicity and humalog, amaryl, (hold).   -hold trulicity, had her weekly dose today, hold amaryl  -Sensitive SSI     Elevated LFTs -no hx of elevated LFTs. IVF overnight and repeat in AM. -continues to be elevated would check US/labs -? Side effect from HIV meds, but no hx of elevated LFTs in past.     Acute-on-CKD stage III (baseline Cr 1.4) -appears a little dry, likely pre renal. -light IVF overnight and trend. Her baseline has varied from 1.01-1.71 over past year, but average is around 1.4. -avoid NSAIDs and ACEi. Has NSAID in med list, will need to educate not to use these -her lisinopril-hctz is ordered for tomorrow, will need to hold if rise in creatinine Lab Results  Component Value Date   CREATININE 1.54 (H) 06/27/2021   CREATININE 1.48 (H) 06/26/2021   CREATININE 1.66 (  H) 06/25/2021   CREATININE 1.80 (H) 06/24/2021  -Baseline     HIV (human immunodeficiency virus infection) ([place]) -last viral load undetectable in June of 2022 -follows regularly with Dr. Baxter Flattery, ID. -continue her Biktarvy, watch LFTs.      HLD (hyperlipidemia)  -lipid panel 05/2021 with LDL of 78 -continue home statin   -7/9 lipid panel pending   DVT prophylaxis: Lovenox Code Status:  Family Communication:  Status is:  Inpatient    Dispo: The patient is from: Home              Anticipated d/c is to: Home vs SNF              Anticipated d/c date is: Per orthopedic surgery              Patient currently unstable      Consultants:  Orthopedic surgery  Procedures/Significant Events:  7/9 MRI LEFT foot W0 contrast; not read   I have personally reviewed and interpreted all radiology studies and my findings are as above.  VENTILATOR SETTINGS:    Cultures   Antimicrobials: Anti-infectives (From admission, onward)    Start     Ordered Stop   06/25/21 1900  vancomycin (VANCOCIN) IVPB 1000 mg/200 mL premix  Status:  Discontinued        06/24/21 1656 06/25/21 1002   06/25/21 1900  vancomycin (VANCOREADY) IVPB 1250 mg/250 mL        06/25/21 1002     06/25/21 0000  cefTRIAXone (ROCEPHIN) 2 g in sodium chloride 0.9 % 100 mL IVPB        06/24/21 1832     06/24/21 2200  bictegravir-emtricitabine-tenofovir AF (BIKTARVY) 50-200-25 MG per tablet 1 tablet        06/24/21 1921     06/24/21 1700  vancomycin (VANCOREADY) IVPB 1750 mg/350 mL        06/24/21 1656 06/24/21 2050   06/24/21 1630  piperacillin-tazobactam (ZOSYN) IVPB 3.375 g        06/24/21 1629 06/24/21 1743         Devices    LINES / TUBES:      Continuous Infusions:  sodium chloride 75 mL/hr at 06/27/21 0815   [MAR Hold] cefTRIAXone (ROCEPHIN)  IV 2 g (06/26/21 2347)   lactated ringers     [MAR Hold] vancomycin 1,250 mg (06/26/21 1943)     Objective: Vitals:   06/26/21 1146 06/26/21 1500 06/26/21 1941 06/27/21 0818  BP: (!) 150/87  122/63 126/72  Pulse: 87  89 86  Resp: 18  17 20   Temp: 99.1 F (37.3 C)  98.8 F (37.1 C) 98.6 F (37 C)  TempSrc: Oral  Oral Oral  SpO2: 98%  97% 100%  Weight:      Height:  5' 9.5" (1.765 m)      Intake/Output Summary (Last 24 hours) at 06/27/2021 1035 Last data filed at 06/27/2021 0500 Gross per 24 hour  Intake 240 ml  Output --  Net 240 ml    Filed Weights   06/24/21  1929  Weight: 125.2 kg    Examination:  General: A/O x4, No acute respiratory distress Eyes: negative scleral hemorrhage, negative anisocoria, negative icterus ENT: Negative Runny nose, negative gingival bleeding, Neck:  Negative scars, masses, torticollis, lymphadenopathy, JVD Lungs: Clear to auscultation bilaterally without wheezes or crackles Cardiovascular: Regular rate and rhythm without murmur gallop or rub normal S1 and S2 Abdomen: negative abdominal pain, nondistended, positive soft, bowel sounds, no rebound, no  ascites, no appreciable mass Extremities: left foot: large, open ulcer s/p I&D with wound VAC in place  skin: Negative rashes, lesions, ulcers Psychiatric:  Negative depression, negative anxiety, negative fatigue, negative mania  Central nervous system:  Cranial nerves II through XII intact, tongue/uvula midline, all extremities muscle strength 5/5, sensation intact throughout, negative dysarthria, negative expressive aphasia, negative receptive aphasia.  .     Data Reviewed: Care during the described time interval was provided by me .  I have reviewed this patient's available data, including medical history, events of note, physical examination, and all test results as part of my evaluation.  CBC: Recent Labs  Lab 06/24/21 1107 06/25/21 0147 06/26/21 0032 06/27/21 0221 06/27/21 0927  WBC 16.5* 14.6* 12.1* 15.3* 11.1*  NEUTROABS 11.9*  --  8.7* 7.9*  --   HGB 15.9* 14.3 13.8 14.6 14.9  HCT 49.7* 42.3 40.9 44.1 45.4  MCV 96.5 93.0 93.8 94.6 94.0  PLT 555* 420* 435* 475* 477*    Basic Metabolic Panel: Recent Labs  Lab 06/24/21 1107 06/25/21 0147 06/26/21 0032 06/27/21 0221  NA 137 137 135 136  K 3.7 3.6 4.1 3.5  CL 106 107 104 106  CO2 19* 20* 22 20*  GLUCOSE 83 82 188* 50*  BUN 22* 19 13 13   CREATININE 1.80* 1.66* 1.48* 1.54*  CALCIUM 9.1 8.7* 8.7* 9.0  MG  --   --  1.9 2.0  PHOS  --   --  3.2 2.3*    GFR: Estimated Creatinine Clearance: 59.7  mL/min (A) (by C-G formula based on SCr of 1.54 mg/dL (H)). Liver Function Tests: Recent Labs  Lab 06/24/21 1107 06/25/21 0147 06/26/21 0032 06/27/21 0221  AST 81* 119* 138* 183*  ALT 145* 145* 171* 257*  ALKPHOS 38 35* 36* 39  BILITOT 0.8 0.7 0.7 0.5  PROT 8.0 7.0 6.7 6.9  ALBUMIN 3.5 2.9* 2.7* 2.9*    No results for input(s): LIPASE, AMYLASE in the last 168 hours. No results for input(s): AMMONIA in the last 168 hours. Coagulation Profile: No results for input(s): INR, PROTIME in the last 168 hours. Cardiac Enzymes: No results for input(s): CKTOTAL, CKMB, CKMBINDEX, TROPONINI in the last 168 hours. BNP (last 3 results) No results for input(s): PROBNP in the last 8760 hours. HbA1C: Recent Labs    06/24/21 1107  HGBA1C 8.8*    CBG: Recent Labs  Lab 06/26/21 2018 06/27/21 0705 06/27/21 0739 06/27/21 0816 06/27/21 0935  GLUCAP 174* 64* 57* 61* 149*    Lipid Profile: Recent Labs    06/27/21 0221  CHOL 118  HDL 29*  LDLCALC 65  TRIG 122  CHOLHDL 4.1   Thyroid Function Tests: No results for input(s): TSH, T4TOTAL, FREET4, T3FREE, THYROIDAB in the last 72 hours. Anemia Panel: No results for input(s): VITAMINB12, FOLATE, FERRITIN, TIBC, IRON, RETICCTPCT in the last 72 hours. Sepsis Labs: Recent Labs  Lab 06/24/21 1648 06/25/21 2038 06/26/21 0032 06/27/21 0221  PROCALCITON  --  <0.10 <0.10 <0.10  LATICACIDVEN 1.3 1.2 1.1  --      Recent Results (from the past 240 hour(s))  Blood culture (routine x 2)     Status: None (Preliminary result)   Collection Time: 06/24/21  4:29 PM   Specimen: BLOOD  Result Value Ref Range Status   Specimen Description BLOOD LEFT ANTECUBITAL  Final   Special Requests   Final    BOTTLES DRAWN AEROBIC AND ANAEROBIC Blood Culture adequate volume   Culture   Final  NO GROWTH 3 DAYS Performed at [facility], [place] [address] [place] [address], [place], [place] [address]    Report Status PENDING  Incomplete  Resp Panel by RT-PCR (Flu  A&B, Covid) Nasopharyngeal Swab     Status: None   Collection Time: 06/24/21  4:29 PM   Specimen: Nasopharyngeal Swab; Nasopharyngeal(NP) swabs in vial transport medium  Result Value Ref Range Status   SARS Coronavirus 2 by RT PCR NEGATIVE NEGATIVE Final    Comment: (NOTE) SARS-CoV-2 target nucleic acids are NOT DETECTED.  The SARS-CoV-2 RNA is generally detectable in upper respiratory specimens during the acute phase of infection. The lowest concentration of SARS-CoV-2 viral copies this assay can detect is 138 copies/mL. A negative result does not preclude SARS-Cov-2 infection and should not be used as the sole basis for treatment or other patient management decisions. A negative result may occur with  improper specimen collection/handling, submission of specimen other than nasopharyngeal swab, presence of viral mutation(s) within the areas targeted by this assay, and inadequate number of viral copies(<138 copies/mL). A negative result must be combined with clinical observations, patient history, and epidemiological information. The expected result is Negative.  Fact Sheet for Patients:  EntrepreneurPulse.com.au  Fact Sheet for Healthcare Providers:  IncredibleEmployment.be  This test is no t yet approved or cleared by the [place] FDA and  has been authorized for detection and/or diagnosis of SARS-CoV-2 by FDA under an Emergency Use Authorization (EUA). This EUA will remain  in effect (meaning this test can be used) for the duration of the COVID-19 declaration under Section 564(b)(1) of the Act, 21 U.S.C.section 360bbb-3(b)(1), unless the authorization is terminated  or revoked sooner.       Influenza A by PCR NEGATIVE NEGATIVE Final   Influenza B by PCR NEGATIVE NEGATIVE Final    Comment: (NOTE) The Xpert Xpress SARS-CoV-2/FLU/RSV plus assay is intended as an aid in the diagnosis of influenza from Nasopharyngeal swab specimens  and should not be used as a sole basis for treatment. Nasal washings and aspirates are unacceptable for Xpert Xpress SARS-CoV-2/FLU/RSV testing.  Fact Sheet for Patients: EntrepreneurPulse.com.au  Fact Sheet for Healthcare Providers: IncredibleEmployment.be  This test is not yet approved or cleared by the [place] FDA and has been authorized for detection and/or diagnosis of SARS-CoV-2 by FDA under an Emergency Use Authorization (EUA). This EUA will remain in effect (meaning this test can be used) for the duration of the COVID-19 declaration under Section 564(b)(1) of the Act, 21 U.S.C. section 360bbb-3(b)(1), unless the authorization is terminated or revoked.  Performed at [facility], [place] [address], [place], [place] [address]   Blood culture (routine x 2)     Status: None (Preliminary result)   Collection Time: 06/24/21  4:34 PM   Specimen: BLOOD  Result Value Ref Range Status   Specimen Description BLOOD SITE NOT SPECIFIED  Final   Special Requests   Final    BOTTLES DRAWN AEROBIC AND ANAEROBIC Blood Culture results may not be optimal due to an inadequate volume of blood received in culture bottles   Culture   Final    NO GROWTH 3 DAYS Performed at [facility], [place] [address], [place], [place] [address]    Report Status PENDING  Incomplete  Surgical PCR screen     Status: None   Collection Time: 06/26/21  4:16 PM   Specimen: Nasal Mucosa; Nasal Swab  Result Value Ref Range Status   MRSA, PCR NEGATIVE NEGATIVE Final  Staphylococcus aureus NEGATIVE NEGATIVE Final    Comment: (NOTE) The Xpert SA Assay (FDA approved for NASAL specimens in patients 48 years of age and older), is one component of a comprehensive surveillance program. It is not intended to diagnose infection nor to guide or monitor treatment. Performed at [facility], [place] [address] [place], [place] [address]           Radiology  Studies: MR FOOT LEFT WO CONTRAST  Result Date: 06/26/2021 CLINICAL DATA:  Diabetic patient with a chronic skin ulceration at the base of the fifth metatarsal. History of prior fifth metatarsal fracture with subsequent infection fracture fixation hardware. EXAM: MRI OF THE LEFT FOOT WITHOUT CONTRAST TECHNIQUE: Multiplanar, multisequence MR imaging of the left foot was performed. No intravenous contrast was administered. COMPARISON:  Plain films left foot 03/01/2019 and 06/24/2021. FINDINGS: Bones/Joint/Cartilage Remote fracture of the base of the fifth metatarsal is again seen. Subjacent to a skin wound, there is an ununited fracture fragment measuring 2.1 cm long by 1.5 cm transverse by 1.5 cm craniocaudal. There is marrow edema within this fragment. Marrow edema is also seen in the proximal 1.5 cm of the fifth metatarsal shaft. Cortical thickening in the fifth metatarsal shaft as seen on prior plain films is noted. Ligaments Negative. Muscles and Tendons No intramuscular fluid collection.  No tendon tear. Soft tissues Skin ulceration is again noted.  No underlying abscess. 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 Marrow edema in an ununited fracture fragment at the base of the fifth metatarsal and adjacent 1.5 cm of the fifth metatarsal shaft is worrisome for osteomyelitis. There is an overlying skin wound but no abscess. Electronically Signed   By: Inge Rise M.D.   On: 06/26/2021 09:11        Scheduled Meds:  [MAR Hold] amLODipine  5 mg Oral q AM   [MAR Hold] aspirin EC  81 mg Oral q AM   [MAR Hold] atorvastatin  10 mg Oral Daily   [MAR Hold] bictegravir-emtricitabine-tenofovir AF  1 tablet Oral QHS   [MAR Hold] dapagliflozin propanediol  10 mg Oral q AM   [MAR Hold] dextrose  1 Tube Oral Once   dextrose       [MAR Hold] enoxaparin (LOVENOX) injection  40 mg Subcutaneous Q24H   [MAR Hold] feeding supplement  296 mL Oral Once   [MAR Hold] gabapentin  100 mg  Oral QID   [MAR Hold] hydrochlorothiazide  25 mg Oral Daily   [MAR Hold] insulin aspart  0-9 Units Subcutaneous TID WC   [MAR Hold] insulin aspart protamine- aspart  60 Units Subcutaneous 2 times per day   [MAR Hold] lisinopril  20 mg Oral Daily   [MAR Hold] mupirocin ointment  1 application Nasal BID   [MAR Hold] norethindrone  5 mg Oral BID   Continuous Infusions:  sodium chloride 75 mL/hr at 06/27/21 0815   [MAR Hold] cefTRIAXone (ROCEPHIN)  IV 2 g (06/26/21 2347)   lactated ringers     [MAR Hold] vancomycin 1,250 mg (06/26/21 1943)     LOS: 3 days    Time spent:40 min    Ludivina Guymon, Geraldo Docker, MD [facility]   If 7PM-7AM, please contact night-coverage 06/27/2021, 10:35 AM

## 2021-06-27 NOTE — Anesthesia Postprocedure Evaluation (Signed)
Anesthesia Post Note  Patient: Katrina Wolfe  Procedure(s) Performed: IRRIGATION AND DEBRIDEMENT FOOT ULCER (Left: Foot)     Patient location during evaluation: PACU Anesthesia Type: General Level of consciousness: awake and alert Pain management: pain level controlled Vital Signs Assessment: post-procedure vital signs reviewed and stable Respiratory status: spontaneous breathing, nonlabored ventilation, respiratory function stable and patient connected to nasal cannula oxygen Cardiovascular status: blood pressure returned to baseline and stable Postop Assessment: no apparent nausea or vomiting Anesthetic complications: no   No notable events documented.  Last Vitals:  Vitals:   06/27/21 1315 06/27/21 1448  BP: 121/66 106/67  Pulse: 76 84  Resp: 16 20  Temp: 36.6 C 37.1 C  SpO2: 98% 95%    Last Pain:  Vitals:   06/27/21 1448  TempSrc: Oral  PainSc:                  Catalina Gravel

## 2021-06-27 NOTE — Op Note (Addendum)
06/24/2021 - 06/27/2021  12:19 PM  PATIENT:  Katrina Wolfe  55 y.o. female  MRN: [id number]  OPERATIVE REPORT  PRE-OPERATIVE DIAGNOSIS:  Left Foot Ulcer  POST-OPERATIVE DIAGNOSIS:  Left Foot Ulcer  PROCEDURE:  Procedure(s): IRRIGATION AND DEBRIDEMENT FOOT ULCER    SURGEON:  Jessy Oto, MD     ASSISTANT:  Benjiman Core, PA-C  (Present throughout the entire procedure and necessary for completion of procedure in a timely manner)     ANESTHESIA:  General,    COMPLICATIONS:  None.   SPECIMEN:Swab and bone specimen for anaerobic and aerobic C&S. Debridement type: Excisional Debridement  Side: left  Body Location: left lateral foot base of fifth metatarsal laterally   Tools used for debridement: scalpel and rongeur  Pre-debridement Wound size (cm):   Length: 3cm        Width: 2.5cm     Depth: 2cm   Post-debridement Wound size (cm):   Length: 5cm        Width: 3cm     Depth: 2.0   Debridement depth beyond dead/damaged tissue down to healthy viable tissue: yes  Tissue layer involved: skin, subcutaneous tissue, muscle / fascia, bone  Nature of tissue removed: Necrotic, Devitalized Tissue, Non-viable tissue, and Purulence  Irrigation volume: 500cc     Irrigation fluid type: Normal Saline        PROCEDURE:The patient was met in the holding area, and the appropriate left foot identified and marked with an "X" and my initials.  The patient was then transported to OR and was placed on the operative table in a supine position. The patient was then placed under general anesthesia without difficulty. The patient received no preoperative antibiotic prophylaxis in order to obtain culture.  Tourniquet was applied to the operative thigh. Leg was then prepped using sterile conditions and draped using sterile technique. Time-out procedure was called and correct .  The right foot was examined and found to be 3cmsquare by1cm deep ulcer with necrotic purulent center over the lateral aspect  of the foot with a open abscessed wound over the lateral aspect of the base the right fifth toe near the proximal end and the Metatarsotarsal joint..  A racquet type incision was then made about the base of the fifth digit extending along the superolateral aspect of the right fifth toe proximal metatarsal in line with the old incision scar the ellipsing the abscess along the lateral portions of the base of the fifth digit. Incision through skin and subcutaneous layers directly down to the lateral aspect of metatarsal proximally  ulcer was able to resected in total and the proximal fragment of the nonunion fracture of the 5th metasarsal base was identified. A bone hand ronguer was then used to debride portion base of the fifth back to bleeding normal appearing bone. It was then carefully examined note cultures were obtained prior to removal of the bone specimen using anaerobic and aerobic swabs. Irrigation was then carried out using saline solution and then at 250 cc of double antibiotic solution. It was really and the tourniquet then removed this patient had significant venous bleeding present.  Following irrigation and then the distall aspect of the incision was closed with interrupted vertical mattress sutures of 2-0 Prolene. The proximal aspect the incision was approximated with a single 2-0 prolene vertical mattress suture. Bleeding was minimal and mainly from the remaining small area of open. Bone.  Stimulin beads made with tobramycin then place in the depth of the open wound and a  small vac placed within the open wound about 3cm square. An esmarch bandage used to make a tourniquet about the distal leg to allow for placement of the VAC with dry skin surfaces. The vac held in place with an [place]. Patient then reactivated extubated returned to his bed in the recovery room in satisfactory condition all instrument and sponge counts were correct.  Specimens: Right little toe proximal metatarsal bone sent for  culture and sensitivity anaerobic and aerobic and stat Gram stain.     Basil Dess  06/27/2021, 12:19 PM

## 2021-06-27 NOTE — Transfer of Care (Signed)
Immediate Anesthesia Transfer of Care Note  Patient: Katrina Wolfe  Procedure(s) Performed: IRRIGATION AND DEBRIDEMENT FOOT ULCER (Left: Foot)  Patient Location: PACU  Anesthesia Type:General  Level of Consciousness: awake, drowsy and patient cooperative  Airway & Oxygen Therapy: Patient Spontanous Breathing and Patient connected to face mask oxygen  Post-op Assessment: Report given to RN, Post -op Vital signs reviewed and stable and Patient moving all extremities X 4  Post vital signs: Reviewed and stable  Last Vitals:  Vitals Value Taken Time  BP 135/81 06/27/21 1212  Temp    Pulse    Resp 22 06/27/21 1212  SpO2    Vitals shown include unvalidated device data.  Last Pain:  Vitals:   06/27/21 1035  TempSrc: Oral  PainSc:       Patients Stated Pain Goal: 1 (58/85/02 7741)  Complications: No notable events documented.

## 2021-06-27 NOTE — Brief Op Note (Addendum)
06/24/2021 - 06/27/2021  12:14 PM  PATIENT:  Jaleyah Bodin  55 y.o. female  PRE-OPERATIVE DIAGNOSIS:  Left Foot Ulcer  POST-OPERATIVE DIAGNOSIS:  Left Foot Ulcer  PROCEDURE:  Procedure(s): IRRIGATION AND DEBRIDEMENT FOOT ULCER (Left)  SURGEON:  Surgeon(s) and Role:    Jessy Oto, MD - Primary  PHYSICIAN ASSISTANT: Benjiman Core, PA-C  ANESTHESIA:   general and via GEO  EBL:  10 mL   BLOOD ADMINISTERED:none  DRAINS:  VAC to -125mm  Hg  LOCAL MEDICATIONS USED:  OTHER Tobramycin stimulin beads  SPECIMEN:  Source of Specimen:  right proximal 5th metatarsal infected bone for anaerobic and aerobic C&S  DISPOSITION OF SPECIMEN:   microbiology  COUNTS:  YES  TOURNIQUET:  * No tourniquets in log * Esmarch tourniquet used for application of VAC lateral right foot for approximately 10 min for aswsist VAC placement.  DICTATION: .Dragon Dictation  PLAN OF CARE:  Has active admission.  PATIENT DISPOSITION:  PACU - hemodynamically stable.   Delay start of Pharmacological VTE agent (>24hrs) due to surgical blood loss or risk of bleeding: no

## 2021-06-27 NOTE — Anesthesia Procedure Notes (Signed)
Procedure Name: LMA Insertion Date/Time: 06/27/2021 11:47 AM Performed by: Jonna Munro, CRNA Pre-anesthesia Checklist: Patient identified, Emergency Drugs available, Suction available, Timeout performed and Patient being monitored Patient Re-evaluated:Patient Re-evaluated prior to induction Oxygen Delivery Method: Circle system utilized Preoxygenation: Pre-oxygenation with 100% oxygen Induction Type: IV induction LMA: LMA with gastric port inserted LMA Size: 4.0 Number of attempts: 1 Placement Confirmation: positive ETCO2 and breath sounds checked- equal and bilateral Tube secured with: Tape Dental Injury: Teeth and Oropharynx as per pre-operative assessment

## 2021-06-27 NOTE — Discharge Instructions (Signed)
    Keep short leg splint and dressing dry. May use water impervious bag or cast bag and tub chair to shower Tape the top of bag to skin to avoid moisture soaking the dressing on the leg. Call if there is odor or saturation of dressing or worsening pain not controlled with medications. Call if fever greater than 101.5. Use crutches or walker or kneeling trolley no weight bearing on the right leg. Please follow up with an appointment with Dr. Sharol Given  1 week from the time of surgery. Elevate as often as possible during the first week after surgery gradually increasing the time the leg is dependent or down there after. If swelling recurrs then elevate again. Wheel chair for longer distances. Take anticoagulant daily at 6 PM.

## 2021-06-27 NOTE — Anesthesia Preprocedure Evaluation (Signed)
Anesthesia Evaluation  Patient identified by MRN, date of birth, ID band Patient awake    Reviewed: Allergy & Precautions, NPO status , Patient's Chart, lab work & pertinent test results  Airway Mallampati: II  TM Distance: >3 FB Neck ROM: Full    Dental  (+) Teeth Intact, Dental Advisory Given   Pulmonary neg pulmonary ROS,    Pulmonary exam normal breath sounds clear to auscultation       Cardiovascular hypertension, Pt. on medications Normal cardiovascular exam Rhythm:Regular Rate:Normal     Neuro/Psych negative psych ROS   GI/Hepatic negative GI ROS, Neg liver ROS,   Endo/Other  diabetes, Poorly Controlled, Type 2, Insulin Dependent, Oral Hypoglycemic AgentsMorbid obesity  Renal/GU Renal InsufficiencyRenal disease     Musculoskeletal negative musculoskeletal ROS (+)   Abdominal   Peds  Hematology negative hematology ROS (+)   Anesthesia Other Findings Day of surgery medications reviewed with the patient.  Reproductive/Obstetrics                             Anesthesia Physical Anesthesia Plan  ASA: 3  Anesthesia Plan: General   Post-op Pain Management:    Induction: Intravenous  PONV Risk Score and Plan: 3 and Midazolam, Dexamethasone and Ondansetron  Airway Management Planned: LMA  Additional Equipment:   Intra-op Plan:   Post-operative Plan: Extubation in OR  Informed Consent: I have reviewed the patients History and Physical, chart, labs and discussed the procedure including the risks, benefits and alternatives for the proposed anesthesia with the patient or authorized representative who has indicated his/her understanding and acceptance.     Dental advisory given  Plan Discussed with: CRNA  Anesthesia Plan Comments:         Anesthesia Quick Evaluation

## 2021-06-27 NOTE — Interval H&P Note (Signed)
History and Physical Interval Note:  06/27/2021 10:54 AM  Katrina Wolfe  has presented today for surgery, with the diagnosis of Left Foot Ulcer.  The various methods of treatment have been discussed with the patient and family. After consideration of risks, benefits and other options for treatment, the patient has consented to  Procedure(s): IRRIGATION AND DEBRIDEMENT FOOT ULCER (Left) as a surgical intervention.  The patient's history has been reviewed, patient examined, no change in status, stable for surgery.  I have reviewed the patient's chart and labs.  Questions were answered to the patient's satisfaction.     Basil Dess

## 2021-06-27 NOTE — Progress Notes (Signed)
Inpatient Diabetes Program Recommendations  AACE/ADA: New Consensus Statement on Inpatient Glycemic Control (2015)  Target Ranges:  Prepandial:   less than 140 mg/dL      Peak postprandial:   less than 180 mg/dL (1-2 hours)      Critically ill patients:  140 - 180 mg/dL   Lab Results  Component Value Date   GLUCAP 177 (H) 06/27/2021   HGBA1C 8.8 (H) 06/24/2021    Review of Glycemic Control Results for Katrina Wolfe, Katrina Wolfe (MRN [id number]) as of 06/27/2021 14:56  Ref. Range 06/27/2021 07:39 06/27/2021 08:16 06/27/2021 09:35 06/27/2021 12:13  Glucose-Capillary Latest Ref Range: 70 - 99 mg/dL 57 (L) 61 (L) 149 (H) 177 (H)   Diabetes history: Type 2 DM Outpatient Diabetes medications: Farxiga 10 mg QAM, Novolog 75/25 60 units BID, Amaryl 2 mg BID, Trulicity 3 mg qwk Current orders for Inpatient glycemic control: Novolog 70/30 60 units BID, Farxiga 10 mg QAM, Novolog 0-9 units TID  Inpatient Diabetes Program Recommendations:    Noted hypoglycemic episode this AM of 57 mg/dL. Consider decreasing Novolog 70/30 to 52 units BID.   Thanks, Bronson Curb, MSN, RNC-OB Diabetes Coordinator [phone] (8a-5p)

## 2021-06-28 ENCOUNTER — Encounter (HOSPITAL_COMMUNITY): Payer: Self-pay | Admitting: Specialist

## 2021-06-28 LAB — GLUCOSE, CAPILLARY
Glucose-Capillary: 38 mg/dL — CL (ref 70–99)
Glucose-Capillary: 45 mg/dL — ABNORMAL LOW (ref 70–99)
Glucose-Capillary: 169 mg/dL — ABNORMAL HIGH (ref 70–99)
Glucose-Capillary: 178 mg/dL — ABNORMAL HIGH (ref 70–99)
Glucose-Capillary: 269 mg/dL — ABNORMAL HIGH (ref 70–99)
Glucose-Capillary: 197 mg/dL — ABNORMAL HIGH (ref 70–99)

## 2021-06-28 LAB — CBC WITH DIFFERENTIAL/PLATELET
Abs Immature Granulocytes: 0.04 10*3/uL (ref 0.00–0.07)
Basophils Absolute: 0 10*3/uL (ref 0.0–0.1)
Basophils Relative: 0 %
Eosinophils Absolute: 0.5 10*3/uL (ref 0.0–0.5)
Eosinophils Relative: 4 %
HCT: 41 % (ref 36.0–46.0)
Hemoglobin: 13.7 g/dL (ref 12.0–15.0)
Immature Granulocytes: 0 %
Lymphocytes Relative: 25 %
Lymphs Abs: 2.6 10*3/uL (ref 0.7–4.0)
MCH: 31.7 pg (ref 26.0–34.0)
MCHC: 33.4 g/dL (ref 30.0–36.0)
MCV: 94.9 fL (ref 80.0–100.0)
Monocytes Absolute: 0.9 10*3/uL (ref 0.1–1.0)
Monocytes Relative: 8 %
Neutro Abs: 6.6 10*3/uL (ref 1.7–7.7)
Neutrophils Relative %: 63 %
Platelets: 423 10*3/uL — ABNORMAL HIGH (ref 150–400)
RBC: 4.32 MIL/uL (ref 3.87–5.11)
RDW: 13.4 % (ref 11.5–15.5)
WBC: 10.6 10*3/uL — ABNORMAL HIGH (ref 4.0–10.5)
nRBC: 0 % (ref 0.0–0.2)

## 2021-06-28 LAB — COMPREHENSIVE METABOLIC PANEL
ALT: 303 U/L — ABNORMAL HIGH (ref 0–44)
AST: 187 U/L — ABNORMAL HIGH (ref 15–41)
Albumin: 2.7 g/dL — ABNORMAL LOW (ref 3.5–5.0)
Alkaline Phosphatase: 34 U/L — ABNORMAL LOW (ref 38–126)
Anion gap: 6 (ref 5–15)
BUN: 13 mg/dL (ref 6–20)
CO2: 23 mmol/L (ref 22–32)
Calcium: 8.6 mg/dL — ABNORMAL LOW (ref 8.9–10.3)
Chloride: 108 mmol/L (ref 98–111)
Creatinine, Ser: 1.62 mg/dL — ABNORMAL HIGH (ref 0.44–1.00)
GFR, Estimated: 38 mL/min — ABNORMAL LOW (ref >60)
Glucose, Bld: 71 mg/dL (ref 70–99)
Potassium: 3.7 mmol/L (ref 3.5–5.1)
Sodium: 137 mmol/L (ref 135–145)
Total Bilirubin: 0.6 mg/dL (ref 0.3–1.2)
Total Protein: 6.6 g/dL (ref 6.5–8.1)

## 2021-06-28 LAB — MAGNESIUM: Magnesium: 1.8 mg/dL (ref 1.7–2.4)

## 2021-06-28 LAB — PHOSPHORUS: Phosphorus: 2.5 mg/dL (ref 2.5–4.6)

## 2021-06-28 MED ORDER — K PHOS MONO-SOD PHOS DI & MONO 155-852-130 MG PO TABS
500.0000 mg | ORAL_TABLET | Freq: Once | ORAL | Status: AC
  Administered 2021-06-28: 500 mg via ORAL
  Filled 2021-06-28: qty 2

## 2021-06-28 MED ORDER — SODIUM CHLORIDE 0.9 % IV BOLUS: 250.0000 mL | Freq: Once | INTRAVENOUS | Status: DC

## 2021-06-28 MED ORDER — SODIUM CHLORIDE 0.9 % IV BOLUS
500.0000 mL | Freq: Once | INTRAVENOUS | Status: AC
  Administered 2021-06-28: 500 mL via INTRAVENOUS

## 2021-06-28 MED ORDER — GLUCOSE 40 % PO GEL
ORAL | Status: AC
  Administered 2021-06-28: 31 g
  Filled 2021-06-28: qty 1.21

## 2021-06-28 MED ORDER — DEXTROSE 50 % IV SOLN
INTRAVENOUS | Status: AC
  Administered 2021-06-28: 50 mL
  Filled 2021-06-28: qty 50

## 2021-06-28 MED ORDER — DEXTROSE-NACL 5-0.9 % IV SOLN: INTRAVENOUS | Status: DC

## 2021-06-28 NOTE — Progress Notes (Signed)
     Subjective: 1 Day Post-Op Procedure(s) (LRB): IRRIGATION AND DEBRIDEMENT FOOT ULCER (Left) Awake, alert and oriented x 4. No complaints. Ulcer was excised and antibiotic beads tobramycin placed and then VAC. She went home previously after initial treatments. Will be nonweight bearing on the left leg. Will need changed to oral antibiotics if ID consult is in agreement. Bone specimen sent for C&S  and these are no growth at one day.  Patient reports pain as mild.    Objective:   VITALS:  Temp:  [98.3 F (36.8 C)-98.8 F (37.1 C)] 98.5 F (36.9 C) (07/12 0727) Pulse Rate:  [79-89] 79 (07/12 0727) Resp:  [18-24] 18 (07/12 0727) BP: (90-118)/(51-72) 118/72 (07/12 0727) SpO2:  [94 %-99 %] 99 % (07/12 0727)  Neurologically intact ABD soft Neurovascular intact Sensation intact distally Intact pulses distally Incision: dressing C/D/I and Vac to negative pressure and is holding    LABS Recent Labs    06/26/21 0032 06/27/21 0221 06/27/21 0927 06/28/21 0258  HGB 13.8 14.6 14.9 13.7  WBC 12.1* 15.3* 11.1* 10.6*  PLT 435* 475* 477* 423*   Recent Labs    06/27/21 0221 06/28/21 0258  NA 136 137  K 3.5 3.7  CL 106 108  CO2 20* 23  BUN 13 13  CREATININE 1.54* 1.62*  GLUCOSE 50* 71   No results for input(s): LABPT, INR in the last 72 hours.   Assessment/Plan: 1 Day Post-Op Procedure(s) (LRB): IRRIGATION AND DEBRIDEMENT FOOT ULCER (Left)  Advance diet Up with therapy D/C IV fluids Continue ABX therapy due to Post-op infection  Basil Dess 06/28/2021, 1:59 PM Patient ID: Katrina Wolfe, female   DOB: 14-Sep-1966, 55 y.o.   MRN: [id number]

## 2021-06-28 NOTE — Progress Notes (Signed)
PROGRESS NOTE    Katrina Wolfe  STM:[id number] DOB: [birth year] DOA: 06/24/2021 PCP: Wenda Low, MD     Brief Narrative:   55 y.o. 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 She has a history of an ORIF of the fifth metatarsal with subsequent removal of hardware secondary to infection. She states she has been feeling bad for the past week with nausea, chills, headaches and more drainage from her ulcer that has a foul odor. She has also had more pain in theThe odor is new and she states she knows something is wrong when it smells this way. She denies any fevers, cough, shortness of breath, chest pain, diarrhea or vomiting. She has some discomfort in her left lower abdomen, but no pain. She has been compliant with her medication.      ED Course: vitals: bp: 134/82, HR: 70, afebrile, RR: 18, oxygen: 100% room air. Labs pertinent for elevated WBC to 16.5, elevated LFTS and creatinine of 1.80. xray with chronic osteomyelitis of left foot. Asked to admit.      Subjective: 7/12 afebrile overnight A/O x4, negative foot pain over surgical site minimal drainage by wound VAC.    Assessment & Plan: Covid vaccination; unvaccinated does not want vaccination   Principal Problem:   Osteomyelitis of left foot (HCC) Active Problems:   HTN (hypertension)   HIV (human immunodeficiency virus infection) ([place])   HLD (hyperlipidemia)   Elevated LFTs   Acute-on-chronic kidney injury ([place])   Diabetes mellitus type 2, uncontrolled, with complications ([place])   Diabetic nephropathy ([place])   CKD (chronic kidney disease), stage III ([place])  Osteomyelitis of left foot ([place]) -has a history of an ORIF of the fifth metatarsal with subsequent removal of hardware secondary to infection. Routinely followed at [facility], Dr. Sharol Given for  debridement of her chronic ulcer. -Elevated WBC here. Xray read as chronic osteo, I called radiology and discussed, but they have no access to see prior films for interval change reporting. -blood cx pending -received vanc and zosyn in ER. -continue empiric therapy with vanc and rocephin  -MRI ordered left foot -wound care consult while in hospital for ulcer -7/10 Per [facility] Orthopedic Surgery scheduled for formal debridement of the lateral foot ulcer in the OR tomorrow AM followed by application of wound vac and antibiotic beads.  -7/12 patient with wound VAC in place.  States no pain, minimal drainage    HTN (hypertension) -Amlodipine 5 mg daily - HCTZ 25 mg daily --Lisinopril 20 mg daily     DM2 (diabetes mellitus, type 2) (HCC) -7/8 hemoglobin A1c= 8.8 -currently on trulicity and humalog, amaryl, (hold).   -7/12 patient became hypoglycemic this a.m. secondary to being in surgery yesterday and eating very little overnight and this a.m.  DC all hypoglycemics except below -7/12 sensitive SSI     Elevated LFTs -no hx of elevated LFTs. IVF overnight and repeat in AM. -continues to be elevated would check US/labs -? Side effect from HIV meds, but no hx of elevated LFTs in past.  -7/12 given that patient is post surgery 1 day would monitor over next 2 days.  If continues to climb then would obtain abdominal ultrasound    Acute-on-CKD stage III (baseline Cr 1.4) -appears a little dry, likely pre renal. -light IVF overnight and trend. Her baseline has varied from  1.01-1.71 over past year, but average is around 1.4. -avoid NSAIDs and ACEi. Has NSAID in med list, will need to educate not to use these -her lisinopril-hctz is ordered for tomorrow, will need to hold if rise in creatinine Lab Results  Component Value Date   CREATININE 1.62 (H) 06/28/2021   CREATININE 1.54 (H) 06/27/2021   CREATININE 1.48 (H) 06/26/2021   CREATININE 1.66 (H) 06/25/2021   CREATININE 1.80 (H) 06/24/2021  -  7/12 D5-0.9% saline 13ml/hr     HIV (human immunodeficiency virus infection) ([place]) -last viral load undetectable in June of 2022 -follows regularly with Dr. Baxter Flattery, ID. -continue her Biktarvy, watch LFTs.      HLD (hyperlipidemia)  -lipid panel 05/2021 with LDL of 78 -Given patient's increased liver enzymes hold statins.  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   Marrow edema in an ununited fracture fragment at the base of the fifth metatarsal and adjacent 1.5 cm of the fifth metatarsal shaft is worrisome for osteomyelitis. There is an overlying skin wound but no abscess.   I have personally reviewed and interpreted all radiology studies and my findings are as above.  VENTILATOR SETTINGS:    Cultures   Antimicrobials: Anti-infectives (From admission, onward)    Start     Ordered Stop   06/25/21 1900  vancomycin (VANCOCIN) IVPB 1000 mg/200 mL premix  Status:  Discontinued        06/24/21 1656 06/25/21 1002   06/25/21 1900  vancomycin (VANCOREADY) IVPB 1250 mg/250 mL        06/25/21 1002     06/25/21 0000  cefTRIAXone (ROCEPHIN) 2 g in sodium chloride 0.9 % 100 mL IVPB        06/24/21 1832     06/24/21 2200  bictegravir-emtricitabine-tenofovir AF (BIKTARVY) 50-200-25 MG per tablet 1 tablet        06/24/21 1921     06/24/21 1700  vancomycin (VANCOREADY) IVPB 1750 mg/350 mL        06/24/21 1656 06/24/21 2050   06/24/21 1630  piperacillin-tazobactam (ZOSYN) IVPB 3.375 g        06/24/21 1629 06/24/21 1743         Devices    LINES / TUBES:       Continuous Infusions:  sodium chloride 50 mL/hr at 06/27/21 1508   cefTRIAXone (ROCEPHIN)  IV 2 g (06/27/21 2356)   vancomycin 1,250 mg (06/27/21 1921)     Objective: Vitals:   06/28/21 0011 06/28/21 0300 06/28/21 0412 06/28/21 0727  BP: (!) 90/51 111/60 (!) 111/57 118/72  Pulse: 85  87 79  Resp: 20  (!) 24 18  Temp: 98.8 F (37.1 C)  98.3 F (36.8 C) 98.5 F (36.9 C)  TempSrc: Oral  Oral Oral  SpO2: 96%  94% 99%  Weight:      Height:  Intake/Output Summary (Last 24 hours) at 06/28/2021 0834 Last data filed at 06/28/2021 0818 Gross per 24 hour  Intake 940 ml  Output 10 ml  Net 930 ml    Filed Weights   06/24/21 1929  Weight: 125.2 kg    Examination:  General: A/O x4, No acute respiratory distress Eyes: negative scleral hemorrhage, negative anisocoria, negative icterus ENT: Negative Runny nose, negative gingival bleeding, Neck:  Negative scars, masses, torticollis, lymphadenopathy, JVD Lungs: Clear to auscultation bilaterally without wheezes or crackles Cardiovascular: Regular rate and rhythm without murmur gallop or rub normal S1 and S2 Abdomen: negative abdominal pain, nondistended, positive soft, bowel sounds, no rebound, no ascites, no appreciable mass Extremities: left foot: large, open ulcer s/p I&D with wound VAC in place  skin: Negative rashes, lesions, ulcers Psychiatric:  Negative depression, negative anxiety, negative fatigue, negative mania  Central nervous system:  Cranial nerves II through XII intact, tongue/uvula midline, all extremities muscle strength 5/5, sensation intact throughout, negative dysarthria, negative expressive aphasia, negative receptive aphasia.  .     Data Reviewed: Care during the described time interval was provided by me .  I have reviewed this patient's available data, including medical history, events of note, physical examination, and all test results as part of my evaluation.  CBC: Recent Labs  Lab  06/24/21 1107 06/25/21 0147 06/26/21 0032 06/27/21 0221 06/27/21 0927 06/28/21 0258  WBC 16.5* 14.6* 12.1* 15.3* 11.1* 10.6*  NEUTROABS 11.9*  --  8.7* 7.9*  --  6.6  HGB 15.9* 14.3 13.8 14.6 14.9 13.7  HCT 49.7* 42.3 40.9 44.1 45.4 41.0  MCV 96.5 93.0 93.8 94.6 94.0 94.9  PLT 555* 420* 435* 475* 477* 423*    Basic Metabolic Panel: Recent Labs  Lab 06/24/21 1107 06/25/21 0147 06/26/21 0032 06/27/21 0221 06/28/21 0258  NA 137 137 135 136 137  K 3.7 3.6 4.1 3.5 3.7  CL 106 107 104 106 108  CO2 19* 20* 22 20* 23  GLUCOSE 83 82 188* 50* 71  BUN 22* 19 13 13 13   CREATININE 1.80* 1.66* 1.48* 1.54* 1.62*  CALCIUM 9.1 8.7* 8.7* 9.0 8.6*  MG  --   --  1.9 2.0 1.8  PHOS  --   --  3.2 2.3* 2.5    GFR: Estimated Creatinine Clearance: 56.7 mL/min (A) (by C-G formula based on SCr of 1.62 mg/dL (H)). Liver Function Tests: Recent Labs  Lab 06/24/21 1107 06/25/21 0147 06/26/21 0032 06/27/21 0221 06/28/21 0258  AST 81* 119* 138* 183* 187*  ALT 145* 145* 171* 257* 303*  ALKPHOS 38 35* 36* 39 34*  BILITOT 0.8 0.7 0.7 0.5 0.6  PROT 8.0 7.0 6.7 6.9 6.6  ALBUMIN 3.5 2.9* 2.7* 2.9* 2.7*    No results for input(s): LIPASE, AMYLASE in the last 168 hours. No results for input(s): AMMONIA in the last 168 hours. Coagulation Profile: No results for input(s): INR, PROTIME in the last 168 hours. Cardiac Enzymes: No results for input(s): CKTOTAL, CKMB, CKMBINDEX, TROPONINI in the last 168 hours. BNP (last 3 results) No results for input(s): PROBNP in the last 8760 hours. HbA1C: No results for input(s): HGBA1C in the last 72 hours.  CBG: Recent Labs  Lab 06/27/21 1213 06/27/21 1645 06/27/21 2233 06/28/21 0631 06/28/21 0728  GLUCAP 177* 159* 217* 38* 45*    Lipid Profile: Recent Labs    06/27/21 0221  CHOL 118  HDL 29*  LDLCALC 65  TRIG 122  CHOLHDL 4.1    Thyroid Function Tests:  No results for input(s): TSH, T4TOTAL, FREET4, T3FREE, THYROIDAB in the last 72  hours. Anemia Panel: No results for input(s): VITAMINB12, FOLATE, FERRITIN, TIBC, IRON, RETICCTPCT in the last 72 hours. Sepsis Labs: Recent Labs  Lab 06/24/21 1648 06/25/21 2038 06/26/21 0032 06/27/21 0221  PROCALCITON  --  <0.10 <0.10 <0.10  LATICACIDVEN 1.3 1.2 1.1  --      Recent Results (from the past 240 hour(s))  Blood culture (routine x 2)     Status: None (Preliminary result)   Collection Time: 06/24/21  4:29 PM   Specimen: BLOOD  Result Value Ref Range Status   Specimen Description BLOOD LEFT ANTECUBITAL  Final   Special Requests   Final    BOTTLES DRAWN AEROBIC AND ANAEROBIC Blood Culture adequate volume   Culture   Final    NO GROWTH 4 DAYS Performed at [facility], [address] [place], [place] [address]    Report Status PENDING  Incomplete  Resp Panel by RT-PCR (Flu A&B, Covid) Nasopharyngeal Swab     Status: None   Collection Time: 06/24/21  4:29 PM   Specimen: Nasopharyngeal Swab; Nasopharyngeal(NP) swabs in vial transport medium  Result Value Ref Range Status   SARS Coronavirus 2 by RT PCR NEGATIVE NEGATIVE Final    Comment: (NOTE) SARS-CoV-2 target nucleic acids are NOT DETECTED.  The SARS-CoV-2 RNA is generally detectable in upper respiratory specimens during the acute phase of infection. The lowest concentration of SARS-CoV-2 viral copies this assay can detect is 138 copies/mL. A negative result does not preclude SARS-Cov-2 infection and should not be used as the sole basis for treatment or other patient management decisions. A negative result may occur with  improper specimen collection/handling, submission of specimen other than nasopharyngeal swab, presence of viral mutation(s) within the areas targeted by this assay, and inadequate number of viral copies(<138 copies/mL). A negative result must be combined with clinical observations, patient history, and epidemiological information. The expected result is Negative.  Fact Sheet for  Patients:  EntrepreneurPulse.com.au  Fact Sheet for Healthcare Providers:  IncredibleEmployment.be  This test is no t yet approved or cleared by the [place] FDA and  has been authorized for detection and/or diagnosis of SARS-CoV-2 by FDA under an Emergency Use Authorization (EUA). This EUA will remain  in effect (meaning this test can be used) for the duration of the COVID-19 declaration under Section 564(b)(1) of the Act, 21 U.S.C.section 360bbb-3(b)(1), unless the authorization is terminated  or revoked sooner.       Influenza A by PCR NEGATIVE NEGATIVE Final   Influenza B by PCR NEGATIVE NEGATIVE Final    Comment: (NOTE) The Xpert Xpress SARS-CoV-2/FLU/RSV plus assay is intended as an aid in the diagnosis of influenza from Nasopharyngeal swab specimens and should not be used as a sole basis for treatment. Nasal washings and aspirates are unacceptable for Xpert Xpress SARS-CoV-2/FLU/RSV testing.  Fact Sheet for Patients: EntrepreneurPulse.com.au  Fact Sheet for Healthcare Providers: IncredibleEmployment.be  This test is not yet approved or cleared by the [place] FDA and has been authorized for detection and/or diagnosis of SARS-CoV-2 by FDA under an Emergency Use Authorization (EUA). This EUA will remain in effect (meaning this test can be used) for the duration of the COVID-19 declaration under Section 564(b)(1) of the Act, 21 U.S.C. section 360bbb-3(b)(1), unless the authorization is terminated or revoked.  Performed at [facility], [place] [address], [place], [place] [address]   Blood culture (routine x 2)  Status: None (Preliminary result)   Collection Time: 06/24/21  4:34 PM   Specimen: BLOOD  Result Value Ref Range Status   Specimen Description BLOOD SITE NOT SPECIFIED  Final   Special Requests   Final    BOTTLES DRAWN AEROBIC AND ANAEROBIC Blood Culture results may not be  optimal due to an inadequate volume of blood received in culture bottles   Culture   Final    NO GROWTH 4 DAYS Performed at [facility], [place] [address] [place], [place] [address]    Report Status PENDING  Incomplete  Surgical PCR screen     Status: None   Collection Time: 06/26/21  4:16 PM   Specimen: Nasal Mucosa; Nasal Swab  Result Value Ref Range Status   MRSA, PCR NEGATIVE NEGATIVE Final   Staphylococcus aureus NEGATIVE NEGATIVE Final    Comment: (NOTE) The Xpert SA Assay (FDA approved for NASAL specimens in patients 51 years of age and older), is one component of a comprehensive surveillance program. It is not intended to diagnose infection nor to guide or monitor treatment. Performed at [facility], [place] [address], [place], [place] [address]   Aerobic/Anaerobic Culture w Gram Stain (surgical/deep wound)     Status: None (Preliminary result)   Collection Time: 06/27/21 11:39 AM   Specimen: Soft Tissue, Other  Result Value Ref Range Status   Specimen Description TISSUE  Final   Special Requests PROXIMAL 5TH METATARSAL INFECTED BONE SPEC A  Final   Gram Stain   Final    RARE WBC PRESENT,BOTH PMN AND MONONUCLEAR NO ORGANISMS SEEN Performed at [facility], [address], [place],  [address]    Culture PENDING  Incomplete   Report Status PENDING  Incomplete          Radiology Studies: No results found.      Scheduled Meds:  amLODipine  5 mg Oral q AM   aspirin EC  81 mg Oral q AM   atorvastatin  10 mg Oral Daily   bictegravir-emtricitabine-tenofovir AF  1 tablet Oral QHS   dapagliflozin propanediol  10 mg Oral q AM   dextrose  1 Tube Oral Once   docusate sodium  100 mg Oral BID   enoxaparin (LOVENOX) injection  40 mg Subcutaneous Q24H   feeding supplement  296 mL Oral Once   gabapentin  100 mg Oral QID   hydrochlorothiazide  25 mg Oral Daily   insulin aspart  0-9 Units Subcutaneous TID WC   lisinopril  20 mg Oral Daily    mupirocin ointment  1 application Nasal BID   norethindrone  5 mg Oral BID   traMADol  50 mg Oral Q6H   Continuous Infusions:  sodium chloride 50 mL/hr at 06/27/21 1508   cefTRIAXone (ROCEPHIN)  IV 2 g (06/27/21 2356)   vancomycin 1,250 mg (06/27/21 1921)     LOS: 4 days    Time spent:40 min    Xaria Judon, Geraldo Docker, MD [facility]   If 7PM-7AM, please contact night-coverage 06/28/2021, 8:34 AM

## 2021-06-28 NOTE — Evaluation (Signed)
Physical Therapy Evaluation Patient Details Name: Katrina Wolfe MRN: [id number] DOB: 04/10/1966 Today's Date: 06/28/2021   History of Present Illness  55 y.o. 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 She has a history of an ORIF of the fifth metatarsal with subsequent removal of hardware secondary to infection. She reported she has been feeling bad  with nausea, chills, headaches and more drainage from her ulcer that has a foul odor and  has also had more pain and that thw odor was new. Pt now s/p I & D of L foot ulcer  Clinical Impression  Pt was seen for mobility on side of bed to get to wheelchair and then back to bed, instructing pt on safety with wheelchair.  Pt has a broken chair and will need to have this replaced with ANTI TIP devices on the chair.  Will recommend she get this as pt is concerned about her son with special needs, and may refuse SNF care.  Follow along with her to work on steps since she has been doing this with scooting on her hips to get up to her apt.  Follow acutely for safety and transfers with safe management of NWB on LLE.    Follow Up Recommendations SNF    Equipment Recommendations  Wheelchair (measurements PT);Wheelchair cushion (measurements PT)    Recommendations for Other Services       Precautions / Restrictions Precautions Precautions: Fall Precaution Comments: wound vac Restrictions Weight Bearing Restrictions: Yes LLE Weight Bearing: Non weight bearing      Mobility  Bed Mobility Overal bed mobility: Needs Assistance Bed Mobility: Supine to Sit;Sit to Supine     Supine to sit: Min guard Sit to supine: Min guard        Transfers Overall transfer level: Needs assistance Equipment used: 1 person hand held assist Transfers: Sit to/from [facility] Sit to Stand: Min  assist Stand pivot transfers: Min assist       General transfer comment: using RLE to avoid LLE to chair and AP transfer from wc back to bed  Ambulation/Gait             General Gait Details: deferred  Stairs            Wheelchair Mobility    Modified Rankin (Stroke Patients Only)       Balance Overall balance assessment: Needs assistance Sitting-balance support: Single extremity supported Sitting balance-Leahy Scale: Good     Standing balance support: Bilateral upper extremity supported Standing balance-Leahy Scale: Poor                               Pertinent Vitals/Pain Pain Assessment: Faces Faces Pain Scale: Hurts little more Pain Location: L foot Pain Descriptors / Indicators: Grimacing;Guarding;Operative site guarding Pain Intervention(s): Limited activity within patient's tolerance;Monitored during session;Premedicated before session;Repositioned    Home Living Family/patient expects to be discharged to:: Private residence Living Arrangements: Children Available Help at Discharge: Family;Available PRN/intermittently Type of Home: Apartment Home Access: Stairs to enter Entrance Stairs-Rails: Right;Left Entrance Stairs-Number of Steps: 10 steps, lives on 2nd floor Home Layout: One level Home Equipment: Wheelchair - manual;Tub bench Additional Comments: wheelchair is not functional    Prior Function Level of Independence: Independent with assistive device(s)   Gait / Transfers Assistance  Needed: wheelchair to avoid WB limits of gait  ADL's / Homemaking Assistance Needed: did self care and house from chair level        Hand Dominance   Dominant Hand: Right    Extremity/Trunk Assessment   Upper Extremity Assessment Upper Extremity Assessment: Defer to OT evaluation    Lower Extremity Assessment Lower Extremity Assessment: Generalized weakness    Cervical / Trunk Assessment Cervical / Trunk Assessment: Normal   Communication   Communication: No difficulties  Cognition Arousal/Alertness: Awake/alert Behavior During Therapy: WFL for tasks assessed/performed Overall Cognitive Status: Within Functional Limits for tasks assessed                                        General Comments General comments (skin integrity, edema, etc.): pt practiced pivot transfer to chair and then slid forward on chair back to bed. Requires help since her wheelchair is not stable    Exercises     Assessment/Plan    PT Assessment Patient needs continued PT services  PT Problem List Decreased strength;Decreased range of motion;Decreased activity tolerance;Decreased balance;Decreased mobility;Decreased knowledge of use of DME;Decreased safety awareness;Pain;Decreased skin integrity       PT Treatment Interventions DME instruction;Gait training;Stair training;Functional mobility training;Therapeutic activities;Therapeutic exercise;Balance training;Neuromuscular re-education;Patient/family education    PT Goals (Current goals can be found in the Care Plan section)  Acute Rehab PT Goals Patient Stated Goal: go home and get therapy PT Goal Formulation: With patient Time For Goal Achievement: 07/12/21 Potential to Achieve Goals: Good    Frequency Min 3X/week   Barriers to discharge Inaccessible home environment;Decreased caregiver support has a flight of steps to enter house    Co-evaluation               AM-PAC PT "6 Clicks" Mobility  Outcome Measure Help needed turning from your back to your side while in a flat bed without using bedrails?: A Little Help needed moving from lying on your back to sitting on the side of a flat bed without using bedrails?: A Little Help needed moving to and from a bed to a chair (including a wheelchair)?: A Little Help needed standing up from a chair using your arms (e.g., wheelchair or bedside chair)?: A Little Help needed to walk in hospital room?:  Total Help needed climbing 3-5 steps with a railing? : Total 6 Click Score: 14    End of Session Equipment Utilized During Treatment: Gait belt Activity Tolerance: Patient limited by fatigue;Patient limited by pain;Treatment limited secondary to medical complications (Comment) Patient left: in bed;with call bell/phone within reach;with bed alarm set Nurse Communication: Mobility status PT Visit Diagnosis: Muscle weakness (generalized) (M62.81);Difficulty in walking, not elsewhere classified (R26.2);Pain Pain - Right/Left: Left Pain - part of body: Ankle and joints of foot    Time: 5465-0354 PT Time Calculation (min) (ACUTE ONLY): 27 min   Charges:   PT Evaluation $PT Eval Moderate Complexity: 1 Mod PT Treatments $Therapeutic Activity: 8-22 mins       Ramond Dial 06/28/2021, 4:28 PM  Mee Hives, PT MS Acute Rehab Dept. Number: [place]

## 2021-06-28 NOTE — Evaluation (Signed)
Occupational Therapy Evaluation Patient Details Name: Katrina Wolfe MRN: [id number] DOB: Jan 08, 1966 Today's Date: 06/28/2021    History of Present Illness Katrina Wolfe is a 55 y.o. 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 She has a history of an ORIF of the fifth metatarsal with subsequent removal of hardware secondary to infection. She reported she has been feeling bad  with nausea, chills, headaches and more drainage from her ulcer that has a foul odor and  has also had more pain and that thw odor was new. Pt now s/p I & D of L foot ulcer   Clinical Impression   Pt presents with decline in function and safety with ADLs and ADL mobility with impaired balance and endurance. PTA pt lived in a second floor apartment with 3 grandchildren and 2 sons, was Ind with ADLs/selfcare, IADLs, cooking and used a w/c for mobility but reports that she could walk and pivot for transfers. Pt is adamant about returning home due to one of her sons being autistic. Pt currently requires min guard A to sit EOB, max - mod A to stand to RW, mod A SPT to [facility], mod A with LB ADLs. Pt would benefit from acute OT services to address impairments to maximize level of function and safety    Follow Up Recommendations  Home health OT pending acute stay progress(adamant to return home)   Equipment Recommendations  3 in 1 bedside commode;Tub/shower bench;Wheelchair (measurements OT);Wheelchair cushion (measurements OT);Other (comment) Management consultant)    Recommendations for Other Services       Precautions / Restrictions Precautions Precautions: Fall;Other (comment) Precaution Comments: wound VAC L foot Restrictions Weight Bearing Restrictions: Yes LLE Weight Bearing: Non weight bearing      Mobility Bed Mobility Overal bed mobility: Needs Assistance Bed Mobility:  Supine to Sit     Supine to sit: Min guard;HOB elevated     General bed mobility comments: increased time    Transfers Overall transfer level: Needs assistance Equipment used: Rolling walker (2 wheeled) Transfers: Sit to/from [facility] Sit to Stand: Mod assist Stand pivot transfers: Mod assist       General transfer comment: mod A to power up with 3 attempts to stand from EOB to RW. Verbal cues to pivot to [facility] with assist to control RW. Pt able to maintain NWB,    Balance Overall balance assessment: Needs assistance Sitting-balance support: Bilateral upper extremity supported;Feet supported Sitting balance-Leahy Scale: Good     Standing balance support: Bilateral upper extremity supported;During functional activity Standing balance-Leahy Scale: Poor                             ADL either performed or assessed with clinical judgement   ADL Overall ADL's : Needs assistance/impaired Eating/Feeding: Independent;Sitting   Grooming: Wash/dry hands;Wash/dry face;Set up;Sitting   Upper Body Bathing: Set up;Supervision/ safety;Sitting   Lower Body Bathing: Moderate assistance;Sitting/lateral leans   Upper Body Dressing : Set up;Supervision/safety;Sitting   Lower Body Dressing: Moderate assistance;Sitting/lateral leans   Toilet Transfer: Moderate assistance;RW;Stand-pivot;Cueing for safety;Cueing for sequencing;BSC   Toileting- Clothing Manipulation and Hygiene: Moderate assistance       Functional mobility during ADLs: Moderate assistance;Cueing for safety;Cueing for sequencing;Rolling walker       Vision Baseline Vision/History: Wears glasses Wears Glasses: Reading only  Patient Visual Report: No change from baseline       Perception     Praxis      Pertinent Vitals/Pain Pain Assessment: Faces Faces Pain Scale: Hurts little more Pain Location: L foot Pain Descriptors / Indicators: Guarding;Grimacing;Discomfort Pain  Intervention(s): Monitored during session;Repositioned     Hand Dominance Right   Extremity/Trunk Assessment Upper Extremity Assessment Upper Extremity Assessment: Overall WFL for tasks assessed   Lower Extremity Assessment Lower Extremity Assessment: Defer to PT evaluation   Cervical / Trunk Assessment Cervical / Trunk Assessment: Normal   Communication Communication Communication: No difficulties   Cognition Arousal/Alertness: Awake/alert Behavior During Therapy: WFL for tasks assessed/performed Overall Cognitive Status: Within Functional Limits for tasks assessed                                     General Comments       Exercises     Shoulder Instructions      Home Living Family/patient expects to be discharged to:: Private residence Living Arrangements: Children (2 sons (1 is autistic), 3 grandchildren) Available Help at Discharge: Family;Available PRN/intermittently Type of Home: Apartment Home Access: Stairs to enter [employer] of Steps: 10 steps, lives on 2nd floor Entrance Stairs-Rails: Right;Left Home Layout: One level     Bathroom Shower/Tub: Teacher, early years/pre: Standard     Home Equipment: Wheelchair - manual;Tub bench   Additional Comments: w/c has no armrests and too narrow for pt      Prior Functioning/Environment Level of Independence: Independent with assistive device(s)  Gait / Transfers Assistance Needed: pt reports that she can walk but uses w/c mostly and SPTs ADL's / Homemaking Assistance Needed: Ind with ADLs/selfcare, IADLs, cooking            OT Problem List: Decreased strength;Impaired balance (sitting and/or standing);Pain;Decreased coordination;Decreased activity tolerance;Decreased knowledge of use of DME or AE      OT Treatment/Interventions: Self-care/ADL training;Therapeutic exercise;Patient/family education;Balance training;Therapeutic activities;DME and/or AE instruction     OT Goals(Current goals can be found in the care plan section) Acute Rehab OT Goals Patient Stated Goal: go home and get therapy OT Goal Formulation: With patient Time For Goal Achievement: 07/12/21 Potential to Achieve Goals: Good ADL Goals Pt Will Perform Grooming: with min assist;with min guard assist;standing Pt Will Perform Upper Body Bathing: with set-up;with modified independence;sitting Pt Will Perform Lower Body Bathing: with min assist;sitting/lateral leans;with adaptive equipment Pt Will Perform Upper Body Dressing: with set-up;with modified independence;sitting Pt Will Perform Lower Body Dressing: with min assist;sitting/lateral leans;with adaptive equipment Pt Will Transfer to Toilet: with min assist;with min guard assist;stand pivot transfer;ambulating;bedside commode Pt Will Perform Toileting - Clothing Manipulation and hygiene: with min assist;with min guard assist;sit to/from stand  OT Frequency: Min 2X/week   Barriers to D/C:            Co-evaluation              AM-PAC OT "6 Clicks" Daily Activity     Outcome Measure Help from another person eating meals?: None Help from another person taking care of personal grooming?: A Little Help from another person toileting, which includes using toliet, bedpan, or urinal?: A Lot Help from another person bathing (including washing, rinsing, drying)?: A Lot Help from another person to put on and taking off regular upper body clothing?: A Little Help from another person to put on and taking off  regular lower body clothing?: A Lot 6 Click Score: 16   End of Session Equipment Utilized During Treatment: Gait belt;Rolling walker;Other (comment) (BSC)  Activity Tolerance: Patient tolerated treatment well Patient left: in chair;with call bell/phone within reach  OT Visit Diagnosis: Unsteadiness on feet (R26.81);Other abnormalities of gait and mobility (R26.89);Pain;Muscle weakness (generalized) (M62.81) Pain - Right/Left:  Left Pain - part of body: Ankle and joints of foot                Time: 1004-1039 OT Time Calculation (min): 35 min Charges:  OT General Charges $OT Visit: 1 Visit OT Evaluation $OT Eval Low Complexity: 1 Low OT Treatments $Self Care/Home Management : 8-22 mins    Emmit Alexanders [facility] 06/28/2021, 11:44 AM

## 2021-06-28 NOTE — Progress Notes (Signed)
Inpatient Diabetes Program Recommendations  AACE/ADA: New Consensus Statement on Inpatient Glycemic Control (2015)  Target Ranges:  Prepandial:   less than 140 mg/dL      Peak postprandial:   less than 180 mg/dL (1-2 hours)      Critically ill patients:  140 - 180 mg/dL   Lab Results  Component Value Date   GLUCAP 169 (H) 06/28/2021   HGBA1C 8.8 (H) 06/24/2021    Review of Glycemic Control Results for Katrina Wolfe, Katrina Wolfe (MRN [id number]) as of 06/28/2021 10:37  Ref. Range 06/27/2021 07:39 06/27/2021 08:16 06/27/2021 09:35 06/27/2021 12:13 06/27/2021 16:45 06/27/2021 22:33 06/28/2021 06:31 06/28/2021 07:28 06/28/2021 08:52  Glucose-Capillary Latest Ref Range: 70 - 99 mg/dL 57 (L) 61 (L) 149 (H) 177 (H) 159 (H) 217 (H) 38 (LL) 45 (L) 169 (H)   Diabetes history: Type 2 DM Outpatient Diabetes medications: Farxiga 10 mg QAM, Novolog 75/25 60 units BID, Amaryl 2 mg BID, Trulicity 3 mg qwk Current orders for Inpatient glycemic control:  Novolog 0-9 units TID  Inpatient Diabetes Program Recommendations:    Noted hypoglycemic episodes in the mornings with glucose trends ok during the day on 70/30 60 units bid.  - Keep 70/30 dose 60 units Qam - reduce pm 70/30 dose to 30 units (1/2 home dose)   Thanks, Tama Headings RN, MSN, BC-ADM Inpatient Diabetes Coordinator Team Pager [phone] (8a-5p)

## 2021-06-28 NOTE — Progress Notes (Signed)
Subjective: Doing well.  Pain controlled.  No specific complaints.   Objective: Vital signs in last 24 hours: Temp:  [97.6 F (36.4 C)-98.8 F (37.1 C)] 98.5 F (36.9 C) (07/12 0727) Pulse Rate:  [76-93] 79 (07/12 0727) Resp:  [12-24] 18 (07/12 0727) BP: (90-121)/(51-74) 118/72 (07/12 0727) SpO2:  [94 %-100 %] 99 % (07/12 0727)  Intake/Output from previous day: 07/11 0701 - 07/12 0700 In: 940 [P.O.:240; I.V.:700] Out: 10 [Blood:10] Intake/Output this shift: Total I/O In: 240 [P.O.:240] Out: 0   Recent Labs    06/26/21 0032 06/27/21 0221 06/27/21 0927 06/28/21 0258  HGB 13.8 14.6 14.9 13.7   Recent Labs    06/27/21 0927 06/28/21 0258  WBC 11.1* 10.6*  RBC 4.83 4.32  HCT 45.4 41.0  PLT 477* 423*   Recent Labs    06/27/21 0221 06/28/21 0258  NA 136 137  K 3.5 3.7  CL 106 108  CO2 20* 23  BUN 13 13  CREATININE 1.54* 1.62*  GLUCOSE 50* 71  CALCIUM 9.0 8.6*   No results for input(s): LABPT, INR in the last 72 hours.  Exam: Pleasant female, alert and oriented, NAD.  Dressing/Vac intact left foot.  Calf nontender.     Assessment/Plan: Continue present care.   Cultures pending  Benjiman Core 06/28/2021, 12:15 PM

## 2021-06-28 NOTE — Progress Notes (Signed)
Pharmacy Antibiotic Note  Katrina Wolfe is a 55 y.o. female admitted on 06/24/2021 with cellulitis. The patient has a history of osteomyelitis and was referred to the ED due to worsening infection of the L lateral foot.  MRI worrisome for left 5th MT osteomyelitis.  She is s/p I&D on 06/27/21.  Pharmacy has been consulted for vancomycin dosing.  Patient is also on Rocephin.  SCr trending up slowly, afebrile, WBC down to 10.6.  Plan: Continue vanc 1250mg  IV Q24H for AUC 507 using SCr 1.62 Continue CTX 2gm IV Q24H per MD Monitor renal fxn, clinical progress, vanc levels soon if still on therapy  Height: 5' 9.5" (176.5 cm) Weight: 125.2 kg (276 lb) IBW/kg (Calculated) : 67.35  Temp (24hrs), Avg:98.4 F (36.9 C), Min:97.6 F (36.4 C), Max:99.2 F (37.3 C)  Recent Labs  Lab 06/24/21 1107 06/24/21 1648 06/25/21 0147 06/25/21 2038 06/26/21 0032 06/27/21 0221 06/27/21 0927 06/28/21 0258  WBC 16.5*  --  14.6*  --  12.1* 15.3* 11.1* 10.6*  CREATININE 1.80*  --  1.66*  --  1.48* 1.54*  --  1.62*  LATICACIDVEN  --  1.3  --  1.2 1.1  --   --   --      Estimated Creatinine Clearance: 56.7 mL/min (A) (by C-G formula based on SCr of 1.62 mg/dL (H)).    Allergies  Allergen Reactions   Shellfish Allergy Anaphylaxis and Swelling   Bactrim [Sulfamethoxazole-Trimethoprim] Hives   HIV on PTA Biktarvy - no CD4 in EPIC   Vanc 7/8 >> CTX 7/8 >> Zosyn x1 7/8   7/8 BCx - NGTD 7/10 surgical PCR - negative 7/11 5th MT bone, spec A -  Tori Dattilio D. Mina Marble, PharmD, BCPS, [place] 06/28/2021, 10:21 AM

## 2021-06-29 ENCOUNTER — Inpatient Hospital Stay: Payer: Self-pay

## 2021-06-29 ENCOUNTER — Encounter (HOSPITAL_COMMUNITY): Payer: Self-pay | Admitting: Specialist

## 2021-06-29 DIAGNOSIS — E1169 Type 2 diabetes mellitus with other specified complication: Principal | ICD-10-CM

## 2021-06-29 DIAGNOSIS — E1165 Type 2 diabetes mellitus with hyperglycemia: Secondary | ICD-10-CM

## 2021-06-29 DIAGNOSIS — E118 Type 2 diabetes mellitus with unspecified complications: Secondary | ICD-10-CM

## 2021-06-29 DIAGNOSIS — R7989 Other specified abnormal findings of blood chemistry: Secondary | ICD-10-CM

## 2021-06-29 DIAGNOSIS — B20 Human immunodeficiency virus [HIV] disease: Secondary | ICD-10-CM

## 2021-06-29 DIAGNOSIS — L97404 Non-pressure chronic ulcer of unspecified heel and midfoot with necrosis of bone: Secondary | ICD-10-CM

## 2021-06-29 DIAGNOSIS — E08621 Diabetes mellitus due to underlying condition with foot ulcer: Secondary | ICD-10-CM

## 2021-06-29 DIAGNOSIS — L97424 Non-pressure chronic ulcer of left heel and midfoot with necrosis of bone: Secondary | ICD-10-CM

## 2021-06-29 DIAGNOSIS — N179 Acute kidney failure, unspecified: Secondary | ICD-10-CM

## 2021-06-29 DIAGNOSIS — M869 Osteomyelitis, unspecified: Secondary | ICD-10-CM

## 2021-06-29 DIAGNOSIS — N189 Chronic kidney disease, unspecified: Secondary | ICD-10-CM

## 2021-06-29 LAB — CULTURE, BLOOD (ROUTINE X 2)
Culture: NO GROWTH
Culture: NO GROWTH
Special Requests: ADEQUATE

## 2021-06-29 LAB — COMPREHENSIVE METABOLIC PANEL
ALT: 309 U/L — ABNORMAL HIGH (ref 0–44)
AST: 172 U/L — ABNORMAL HIGH (ref 15–41)
Albumin: 2.7 g/dL — ABNORMAL LOW (ref 3.5–5.0)
Alkaline Phosphatase: 38 U/L (ref 38–126)
Anion gap: 5 (ref 5–15)
BUN: 11 mg/dL (ref 6–20)
CO2: 23 mmol/L (ref 22–32)
Calcium: 8.2 mg/dL — ABNORMAL LOW (ref 8.9–10.3)
Chloride: 107 mmol/L (ref 98–111)
Creatinine, Ser: 1.41 mg/dL — ABNORMAL HIGH (ref 0.44–1.00)
GFR, Estimated: 44 mL/min — ABNORMAL LOW (ref >60)
Glucose, Bld: 128 mg/dL — ABNORMAL HIGH (ref 70–99)
Potassium: 3.9 mmol/L (ref 3.5–5.1)
Sodium: 135 mmol/L (ref 135–145)
Total Bilirubin: 0.5 mg/dL (ref 0.3–1.2)
Total Protein: 6.5 g/dL (ref 6.5–8.1)

## 2021-06-29 LAB — CBC WITH DIFFERENTIAL/PLATELET
Abs Immature Granulocytes: 0.03 10*3/uL (ref 0.00–0.07)
Basophils Absolute: 0 10*3/uL (ref 0.0–0.1)
Basophils Relative: 0 %
Eosinophils Absolute: 0.5 10*3/uL (ref 0.0–0.5)
Eosinophils Relative: 5 %
HCT: 40.5 % (ref 36.0–46.0)
Hemoglobin: 13 g/dL (ref 12.0–15.0)
Immature Granulocytes: 0 %
Lymphocytes Relative: 20 %
Lymphs Abs: 2 10*3/uL (ref 0.7–4.0)
MCH: 30.9 pg (ref 26.0–34.0)
MCHC: 32.1 g/dL (ref 30.0–36.0)
MCV: 96.2 fL (ref 80.0–100.0)
Monocytes Absolute: 0.9 10*3/uL (ref 0.1–1.0)
Monocytes Relative: 9 %
Neutro Abs: 6.5 10*3/uL (ref 1.7–7.7)
Neutrophils Relative %: 66 %
Platelets: 390 10*3/uL (ref 150–400)
RBC: 4.21 MIL/uL (ref 3.87–5.11)
RDW: 13.4 % (ref 11.5–15.5)
WBC: 10 10*3/uL (ref 4.0–10.5)
nRBC: 0 % (ref 0.0–0.2)

## 2021-06-29 LAB — GLUCOSE, CAPILLARY
Glucose-Capillary: 107 mg/dL — ABNORMAL HIGH (ref 70–99)
Glucose-Capillary: 127 mg/dL — ABNORMAL HIGH (ref 70–99)
Glucose-Capillary: 205 mg/dL — ABNORMAL HIGH (ref 70–99)
Glucose-Capillary: 245 mg/dL — ABNORMAL HIGH (ref 70–99)

## 2021-06-29 LAB — MAGNESIUM: Magnesium: 1.8 mg/dL (ref 1.7–2.4)

## 2021-06-29 LAB — PHOSPHORUS: Phosphorus: 3.2 mg/dL (ref 2.5–4.6)

## 2021-06-29 LAB — VANCOMYCIN, PEAK: Vancomycin Pk: 30 ug/mL (ref 30–40)

## 2021-06-29 LAB — VANCOMYCIN, TROUGH: Vancomycin Tr: 13 ug/mL — ABNORMAL LOW (ref 15–20)

## 2021-06-29 MED ORDER — LISINOPRIL 10 MG PO TABS
10.0000 mg | ORAL_TABLET | Freq: Every day | ORAL | Status: DC
  Administered 2021-06-30: 10 mg via ORAL
  Filled 2021-06-29: qty 1

## 2021-06-29 MED ORDER — SODIUM CHLORIDE 0.9 % IR SOLN
Status: DC | PRN
  Administered 2021-06-27: 1000 mL

## 2021-06-29 NOTE — Progress Notes (Signed)
Physical Therapy Treatment Patient Details Name: Katrina Wolfe MRN: [id number] DOB: January 26, 1966 Today's Date: 06/29/2021    History of Present Illness 55 y.o. 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 She has a history of an ORIF of the fifth metatarsal with subsequent removal of hardware secondary to infection. She reported she has been feeling bad  with nausea, chills, headaches and more drainage from her ulcer that has a foul odor and  has also had more pain and that thw odor was new. Pt now s/p I & D of L foot ulcer    PT Comments    Pt was seen for work on the task of getting up steps, and is able to hop a couple with two rails.  Worked with her on LE strength afterward, and pt voices understanding of how to work on strength to initiate standing.  Follow along with her to progress with all goals of PT including LLE strength and ability to walk on a RW and staircase.   Follow Up Recommendations  SNF     Equipment Recommendations  Wheelchair (measurements PT);Wheelchair cushion (measurements PT)    Recommendations for Other Services       Precautions / Restrictions Precautions Precautions: Fall Precaution Comments: wound vac Restrictions Weight Bearing Restrictions: Yes LLE Weight Bearing: Non weight bearing    Mobility  Bed Mobility Overal bed mobility: Needs Assistance Bed Mobility: Supine to Sit     Supine to sit: Min guard     General bed mobility comments: pt requires minimal help to pivot to side of bed    Transfers Overall transfer level: Needs assistance Equipment used: 1 person hand held assist Transfers: Sit to/from [facility] Sit to Stand: Min guard Stand pivot transfers: Min guard       General transfer comment: pt used RW to avoid WB  Ambulation/Gait Ambulation/Gait  assistance: Min guard Gait Distance (Feet): 3 Feet Assistive device: Rolling walker (2 wheeled)   Gait velocity: reduced   General Gait Details: pivoting steps to chair   Stairs Stairs: Yes Stairs assistance: Min guard;Min assist Stair Management: One rail Right;One rail Left;Forwards Number of Stairs: 4 General stair comments: hopped up two, then two more after a length of rest   Wheelchair Mobility    Modified Rankin (Stroke Patients Only)       Balance Overall balance assessment: Needs assistance Sitting-balance support: Feet supported Sitting balance-Leahy Scale: Good     Standing balance support: Bilateral upper extremity supported Standing balance-Leahy Scale: Poor                              Cognition Arousal/Alertness: Awake/alert Behavior During Therapy: WFL for tasks assessed/performed Overall Cognitive Status: Within Functional Limits for tasks assessed                                        Exercises      General Comments General comments (skin integrity, edema, etc.): pt was assisted to recliner then after step training sat up in chair      Pertinent Vitals/Pain Pain Assessment: Faces Faces Pain Scale: Hurts little more Pain Location: L foot Pain Descriptors / Indicators: Operative site guarding Pain  Intervention(s): Limited activity within patient's tolerance;Monitored during session;Premedicated before session;Repositioned    Home Living                      Prior Function            PT Goals (current goals can now be found in the care plan section) Acute Rehab PT Goals Patient Stated Goal: go home and get therapy Progress towards PT goals: Progressing toward goals    Frequency    Min 3X/week      PT Plan Current plan remains appropriate    Co-evaluation              AM-PAC PT "6 Clicks" Mobility   Outcome Measure  Help needed turning from your back to your side while in a flat  bed without using bedrails?: A Little Help needed moving from lying on your back to sitting on the side of a flat bed without using bedrails?: A Little Help needed moving to and from a bed to a chair (including a wheelchair)?: A Little Help needed standing up from a chair using your arms (e.g., wheelchair or bedside chair)?: A Little Help needed to walk in hospital room?: A Lot Help needed climbing 3-5 steps with a railing? : A Lot 6 Click Score: 16    End of Session Equipment Utilized During Treatment: Gait belt Activity Tolerance: Patient limited by fatigue;Patient limited by pain Patient left: in chair;with call bell/phone within reach Nurse Communication: Mobility status PT Visit Diagnosis: Muscle weakness (generalized) (M62.81);Difficulty in walking, not elsewhere classified (R26.2);Pain Pain - Right/Left: Left Pain - part of body: Ankle and joints of foot     Time: 1112-1203 PT Time Calculation (min) (ACUTE ONLY): 51 min  Charges:  $Gait Training: 8-22 mins $Therapeutic Activity: 23-37 mins                   Ramond Dial 06/29/2021, 7:49 PM  Mee Hives, PT MS Acute Rehab Dept. Number: [place]

## 2021-06-29 NOTE — Progress Notes (Signed)
Inpatient Diabetes Program Recommendations  AACE/ADA: New Consensus Statement on Inpatient Glycemic Control (2015)  Target Ranges:  Prepandial:   less than 140 mg/dL      Peak postprandial:   less than 180 mg/dL (1-2 hours)      Critically ill patients:  140 - 180 mg/dL   Lab Results  Component Value Date   GLUCAP 127 (H) 06/29/2021   HGBA1C 8.8 (H) 06/24/2021    Review of Glycemic Control Results for Katrina Wolfe, Katrina Wolfe (MRN [id number]) as of 06/29/2021 12:08  Ref. Range 06/28/2021 11:49 06/28/2021 16:03 06/28/2021 21:00 06/29/2021 06:01 06/29/2021 12:06  Glucose-Capillary Latest Ref Range: 70 - 99 mg/dL 178 (H) 269 (H) 197 (H) 107 (H) 127 (H)   Diabetes history: DM 2 Outpatient Diabetes medications:  Amaryl 2 mg bid Trulicity 3 mg every Friday Humalog 75/25 60 units with breakfast and at bedtime Current orders for Inpatient glycemic control:  Novolog sensitive tid with meals  Inpatient Diabetes Program Recommendations:    Note low blood sugars with 70/30 insulin.  Agree with holding for now.  If 70/30 resumed, recommend lower dose and moving pm dose to evening meal time to prevent low blood sugars the next morning.   Will follow.   Thanks,  Adah Perl, RN, BC-ADM Inpatient Diabetes Coordinator Pager [phone]  (8a-5p)

## 2021-06-29 NOTE — Consult Note (Signed)
[facility]    Date of Admission:  06/24/2021     Reason for Consult: Osteomyelitis     Referring Physician: Dr Marylu Lund  Current antibiotics: Vancomycin 7/8--present Ceftriaxone 7/8--present  Previous antibiotics: Zosyn x1 dose 7/8  ASSESSMENT:    Osteomyelitis of the left foot: Status post OR debridement 06/27/2021 with Dr. Otelia Sergeant.  Or cultures with growth of corynebacterium, however, patient was on antibiotics prior to her surgery. HIV: Well-controlled on Biktarvy and followed by Dr. Drue Second. Elevated LFTs: Unclear etiology as they were essentially normal last month.  Unlikely related to her HIV medications and more likely related to her current presentation.  Acute kidney injury: Presented with creatinine 1.8 with a baseline of 1.4.  Currently stabilizing. Diabetes: Current hemoglobin A1c is 8.8   PLAN:    Continue vancomycin per pharmacy dosed for renal function Continue ceftriaxone Check ABI given her co-morbidities to ensure adequate blood flow PICC line Wound care, glycemic control Baseline ESR/ CRP Follow-up cultures Continue Biktarvy Will check hepatitis serologies Consider right upper quadrant ultrasound   Principal Problem:   Osteomyelitis of left foot (HCC) Active Problems:   HTN (hypertension)   HIV (human immunodeficiency virus infection) (HCC)   HLD (hyperlipidemia)   Elevated LFTs   Acute-on-chronic kidney injury (HCC)   Diabetes mellitus type 2, uncontrolled, with complications (HCC)   Diabetic nephropathy (HCC)   CKD (chronic kidney disease), stage III (HCC)   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, with necrosis of bone (HCC)   MEDICATIONS:    Scheduled Meds:  amLODipine  5 mg Oral q AM   aspirin EC  81 mg Oral q AM   bictegravir-emtricitabine-tenofovir AF  1 tablet Oral QHS   dextrose  1 Tube Oral Once   docusate sodium  100 mg Oral BID   enoxaparin (LOVENOX)  injection  40 mg Subcutaneous Q24H   feeding supplement  296 mL Oral Once   gabapentin  100 mg Oral QID   hydrochlorothiazide  25 mg Oral Daily   insulin aspart  0-9 Units Subcutaneous TID WC   lisinopril  20 mg Oral Daily   mupirocin ointment  1 application Nasal BID   norethindrone  5 mg Oral BID   traMADol  50 mg Oral Q6H   Continuous Infusions:  cefTRIAXone (ROCEPHIN)  IV 2 g (06/28/21 2320)   dextrose 5 % and 0.9% NaCl 75 mL/hr at 06/28/21 1659   vancomycin 1,250 mg (06/28/21 1846)   PRN Meds:.acetaminophen, bisacodyl, HYDROcodone-acetaminophen, HYDROcodone-acetaminophen, metoCLOPramide **OR** metoCLOPramide (REGLAN) injection, morphine injection, ondansetron **OR** ondansetron (ZOFRAN) IV, polyethylene glycol, sodium phosphate, traMADol  HPI:    Katrina Wolfe is a 55 y.o. female with a past medical history significant for HIV well-controlled, hypertension, diabetes, CKD, hyperlipidemia, and a fixed cavovarus foot deformity who presented from her orthopedic surgery office on 06/24/2021 with increased pain and drainage from her ulcer.  She also describes low-grade fevers and malaise.  She has a history of ORIF of the fifth metatarsal with subsequent removal of hardware secondary to infection in 2020.  Upon presentation here she underwent MRI which showed concern for osteomyelitis and she is status postdebridement with Dr. Otelia Sergeant on 7/11.  Cultures from the OR have grown corynebacterium and we are consulted for further recommendations.   Past Medical History:  Diagnosis Date   Diabetes mellitus without complication (HCC)    HIV (human immunodeficiency virus infection) (HCC)    Hypercholesterolemia    Hypertension  Renal disorder     Social History   Tobacco Use   Smoking status: Never   Smokeless tobacco: Never    History reviewed. No pertinent family history.  Allergies  Allergen Reactions   Shellfish Allergy Anaphylaxis and Swelling   Bactrim  [Sulfamethoxazole-Trimethoprim] Hives    Review of Systems  Constitutional:  Positive for fever and malaise/fatigue.  Musculoskeletal:  Positive for joint pain.  Skin:        Positive for drainage from wound  All other systems reviewed and are negative.  OBJECTIVE:   Blood pressure 121/60, pulse 84, temperature 98 F (36.7 C), temperature source Oral, resp. rate 18, height 5' 9.5" (1.765 m), weight 125.2 kg, SpO2 96 %. Body mass index is 40.17 kg/m.  Physical Exam Constitutional:      General: She is not in acute distress.    Appearance: Normal appearance.  HENT:     Head: Normocephalic and atraumatic.  Eyes:     Extraocular Movements: Extraocular movements intact.     Conjunctiva/sclera: Conjunctivae normal.  Cardiovascular:     Rate and Rhythm: Normal rate and regular rhythm.     Heart sounds: No murmur heard. Pulmonary:     Effort: Pulmonary effort is normal. No respiratory distress.     Breath sounds: Normal breath sounds.  Abdominal:     Palpations: Abdomen is soft.     Tenderness: There is no abdominal tenderness.  Musculoskeletal:     Comments: Left foot deformity noted.  Postsurgical dressings in place with wound VAC.  Skin:    General: Skin is warm and dry.  Neurological:     General: No focal deficit present.     Mental Status: She is alert and oriented to person, place, and time.  Psychiatric:        Mood and Affect: Mood normal.        Behavior: Behavior normal.     Lab Results: Lab Results  Component Value Date   WBC 10.0 06/29/2021   HGB 13.0 06/29/2021   HCT 40.5 06/29/2021   MCV 96.2 06/29/2021   PLT 390 06/29/2021    Lab Results  Component Value Date   NA 135 06/29/2021   K 3.9 06/29/2021   CO2 23 06/29/2021   GLUCOSE 128 (H) 06/29/2021   BUN 11 06/29/2021   CREATININE 1.41 (H) 06/29/2021   CALCIUM 8.2 (L) 06/29/2021   GFRNONAA 44 (L) 06/29/2021    Lab Results  Component Value Date   ALT 309 (H) 06/29/2021   AST 172 (H)  06/29/2021   ALKPHOS 38 06/29/2021   BILITOT 0.5 06/29/2021    No results found for: CRP  No results found for: ESRSEDRATE  I have reviewed the micro and lab results in Epic.  Imaging: No results found.   Imaging independently reviewed in Epic.  Raynelle Highland [facility] [phone] pager 06/29/2021, 11:37 AM

## 2021-06-29 NOTE — Progress Notes (Addendum)
Pharmacy Antibiotic Note  Katrina Wolfe is a 55 y.o. female admitted on 06/24/2021 with cellulitis. The patient has a history of osteomyelitis and was referred to the ED due to worsening infection of the L lateral foot.  MRI worrisome for left 5th MT osteomyelitis.  She is s/p I&D on 06/27/21.  Pharmacy has been consulted for vancomycin dosing.  Patient is also on Rocephin.  SCr fluctuating and overall stable.  Vancomycin AUC therapeutic at 445 (goal 400-550).  Afebrile, WBC WNL, PCT negative.  Plan: Continue vanc 1250mg  IV Q24H CTX 2gm IV Q24H per MD Monitor renal fxn, micro data, repeat vanc levels as needed F/u ID consult  Height: 5' 9.5" (176.5 cm) Weight: 125.2 kg (276 lb) IBW/kg (Calculated) : 67.35  Temp (24hrs), Avg:98 F (36.7 C), Min:97.5 F (36.4 C), Max:98.2 F (36.8 C)  Recent Labs  Lab 06/24/21 1648 06/25/21 0147 06/25/21 2038 06/26/21 0032 06/27/21 0221 06/27/21 0927 06/28/21 0258 06/28/21 2149 06/29/21 0434 06/29/21 1217  WBC  --  14.6*  --  12.1* 15.3* 11.1* 10.6*  --  10.0  --   CREATININE  --  1.66*  --  1.48* 1.54*  --  1.62*  --  1.41*  --   LATICACIDVEN 1.3  --  1.2 1.1  --   --   --   --   --   --   VANCOTROUGH  --   --   --   --   --   --   --   --   --  13*  VANCOPEAK  --   --   --   --   --   --   --  30  --   --      Estimated Creatinine Clearance: 65.2 mL/min (A) (by C-G formula based on SCr of 1.41 mg/dL (H)).    Allergies  Allergen Reactions   Shellfish Allergy Anaphylaxis and Swelling   Bactrim [Sulfamethoxazole-Trimethoprim] Hives    HIV on PTA Biktarvy - no CD4 in EPIC   Vanc 7/8 >> CTX 7/8 >> Zosyn x1 7/8   7/12-7/13 VP/VT 30/13, AUC 445 on 1250mg  q24 >> no change   7/8 BCx - NGTD 7/10 surgical PCR - negative 7/11 5th MT bone, spec A -  Rashaan Wyles D. Mina Marble, PharmD, BCPS, [place] 06/29/2021, 2:23 PM

## 2021-06-29 NOTE — Progress Notes (Signed)
PROGRESS NOTE    [name]  ONG:[id number] DOB: Apr 08, 1966 DOA: 06/24/2021 PCP: Wenda Low, MD    Brief Narrative:  55 y.o. 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 She has a history of an ORIF of the fifth metatarsal with subsequent removal of hardware secondary to infection. She states she has been feeling bad for the past week with nausea, chills, headaches and more drainage from her ulcer that has a foul odor. She has also had more pain in theThe odor is new and she states she knows something is wrong when it smells this way. She denies any fevers, cough, shortness of breath, chest pain, diarrhea or vomiting. She has some discomfort in her left lower abdomen, but no pain. She has been compliant with her medication. ED Course: vitals: bp: 134/82, HR: 70, afebrile, RR: 18, oxygen: 100% room air. Labs pertinent for elevated WBC to 16.5, elevated LFTS and creatinine of 1.80. xray with chronic osteomyelitis of left foot. Asked to admit.    7/13 no overnight issues.  Afebrile overnight    Consultants:  Orthopedics  Procedures:   Antimicrobials:  Ceftriaxone vancomycin   Subjective: No shortness of breath, chest pain.  No leg pain this AM  Objective: Vitals:   06/28/21 2000 06/29/21 0539 06/29/21 0816 06/29/21 1206  BP: (!) 93/58 (!) 104/53 121/60 129/78  Pulse: 83  84 78  Resp: 16  18   Temp: 98.2 F (36.8 C)  98 F (36.7 C) 98.1 F (36.7 C)  TempSrc: Oral  Oral Oral  SpO2: 97%  96% 100%  Weight:      Height:       No intake or output data in the 24 hours ending 06/29/21 1442 Filed Weights   06/24/21 1929  Weight: 125.2 kg    Examination:  General exam: Appears calm and comfortable  Respiratory system: Clear to auscultation. Respiratory effort normal. Cardiovascular system: S1  & S2 heard, RRR. No JVD, murmurs, rubs, gallops or clicks. No pedal edema. Gastrointestinal system: Abdomen is nondistended, soft and nontender.  Normal bowel sounds heard. Central nervous system: Alert and oriented.  Grossly intact  extremities: Left foot dressing in place with drainage right foot no edema Skin: Warm dry Psychiatry:Mood & affect appropriate.     Data Reviewed: I have personally reviewed following labs and imaging studies  CBC: Recent Labs  Lab 06/24/21 1107 06/25/21 0147 06/26/21 0032 06/27/21 0221 06/27/21 0927 06/28/21 0258 06/29/21 0434  WBC 16.5*   < > 12.1* 15.3* 11.1* 10.6* 10.0  NEUTROABS 11.9*  --  8.7* 7.9*  --  6.6 6.5  HGB 15.9*   < > 13.8 14.6 14.9 13.7 13.0  HCT 49.7*   < > 40.9 44.1 45.4 41.0 40.5  MCV 96.5   < > 93.8 94.6 94.0 94.9 96.2  PLT 555*   < > 435* 475* 477* 423* 390   < > = values in this interval not displayed.   Basic Metabolic Panel: Recent Labs  Lab 06/25/21 0147 06/26/21 0032 06/27/21 0221 06/28/21 0258 06/29/21 0434  NA 137 135 136 137 135  K 3.6 4.1 3.5 3.7 3.9  CL 107 104 106 108 107  CO2 20* 22 20* 23 23  GLUCOSE 82 188* 50* 71 128*  BUN 19 13 13 13 11   CREATININE 1.66*  1.48* 1.54* 1.62* 1.41*  CALCIUM 8.7* 8.7* 9.0 8.6* 8.2*  MG  --  1.9 2.0 1.8 1.8  PHOS  --  3.2 2.3* 2.5 3.2   GFR: Estimated Creatinine Clearance: 65.2 mL/min (A) (by C-G formula based on SCr of 1.41 mg/dL (H)). Liver Function Tests: Recent Labs  Lab 06/25/21 0147 06/26/21 0032 06/27/21 0221 06/28/21 0258 06/29/21 0434  AST 119* 138* 183* 187* 172*  ALT 145* 171* 257* 303* 309*  ALKPHOS 35* 36* 39 34* 38  BILITOT 0.7 0.7 0.5 0.6 0.5  PROT 7.0 6.7 6.9 6.6 6.5  ALBUMIN 2.9* 2.7* 2.9* 2.7* 2.7*   No results for input(s): LIPASE, AMYLASE in the last 168 hours. No results for input(s): AMMONIA in the last 168 hours. Coagulation Profile: No results for input(s): INR, PROTIME in the last 168 hours. Cardiac Enzymes: No results for  input(s): CKTOTAL, CKMB, CKMBINDEX, TROPONINI in the last 168 hours. BNP (last 3 results) No results for input(s): PROBNP in the last 8760 hours. HbA1C: No results for input(s): HGBA1C in the last 72 hours. CBG: Recent Labs  Lab 06/28/21 1149 06/28/21 1603 06/28/21 2100 06/29/21 0601 06/29/21 1206  GLUCAP 178* 269* 197* 107* 127*   Lipid Profile: Recent Labs    06/27/21 0221  CHOL 118  HDL 29*  LDLCALC 65  TRIG 122  CHOLHDL 4.1   Thyroid Function Tests: No results for input(s): TSH, T4TOTAL, FREET4, T3FREE, THYROIDAB in the last 72 hours. Anemia Panel: No results for input(s): VITAMINB12, FOLATE, FERRITIN, TIBC, IRON, RETICCTPCT in the last 72 hours. Sepsis Labs: Recent Labs  Lab 06/24/21 1648 06/25/21 2038 06/26/21 0032 06/27/21 0221  PROCALCITON  --  <0.10 <0.10 <0.10  LATICACIDVEN 1.3 1.2 1.1  --     Recent Results (from the past 240 hour(s))  Blood culture (routine x 2)     Status: None   Collection Time: 06/24/21  4:29 PM   Specimen: BLOOD  Result Value Ref Range Status   Specimen Description BLOOD LEFT ANTECUBITAL  Final   Special Requests   Final    BOTTLES DRAWN AEROBIC AND ANAEROBIC Blood Culture adequate volume   Culture   Final    NO GROWTH 5 DAYS Performed at [facility], [address], [place], [place] [address]    Report Status 06/29/2021 FINAL  Final  Resp Panel by RT-PCR (Flu A&B, Covid) Nasopharyngeal Swab     Status: None   Collection Time: 06/24/21  4:29 PM   Specimen: Nasopharyngeal Swab; Nasopharyngeal(NP) swabs in vial transport medium  Result Value Ref Range Status   SARS Coronavirus 2 by RT PCR NEGATIVE NEGATIVE Final    Comment: (NOTE) SARS-CoV-2 target nucleic acids are NOT DETECTED.  The SARS-CoV-2 RNA is generally detectable in upper respiratory specimens during the acute phase of infection. The lowest concentration of SARS-CoV-2 viral copies this assay can detect is 138 copies/mL. A negative result does not preclude  SARS-Cov-2 infection and should not be used as the sole basis for treatment or other patient management decisions. A negative result may occur with  improper specimen collection/handling, submission of specimen other than nasopharyngeal swab, presence of viral mutation(s) within the areas targeted by this assay, and inadequate number of viral copies(<138 copies/mL). A negative result must be combined with clinical observations, patient history, and epidemiological information. The expected result is Negative.  Fact Sheet for Patients:  EntrepreneurPulse.com.au  Fact Sheet for Healthcare Providers:  IncredibleEmployment.be  This test is no t yet approved or cleared  by the [place] and  has been authorized for detection and/or diagnosis of SARS-CoV-2 by FDA under an Emergency Use Authorization (EUA). This EUA will remain  in effect (meaning this test can be used) for the duration of the COVID-19 declaration under Section 564(b)(1) of the Act, 21 U.S.C.section 360bbb-3(b)(1), unless the authorization is terminated  or revoked sooner.       Influenza A by PCR NEGATIVE NEGATIVE Final   Influenza B by PCR NEGATIVE NEGATIVE Final    Comment: (NOTE) The Xpert Xpress SARS-CoV-2/FLU/RSV plus assay is intended as an aid in the diagnosis of influenza from Nasopharyngeal swab specimens and should not be used as a sole basis for treatment. Nasal washings and aspirates are unacceptable for Xpert Xpress SARS-CoV-2/FLU/RSV testing.  Fact Sheet for Patients: EntrepreneurPulse.com.au  Fact Sheet for Healthcare Providers: IncredibleEmployment.be  This test is not yet approved or cleared by the [place] FDA and has been authorized for detection and/or diagnosis of SARS-CoV-2 by FDA under an Emergency Use Authorization (EUA). This EUA will remain in effect (meaning this test can be used) for the duration of  the COVID-19 declaration under Section 564(b)(1) of the Act, 21 U.S.C. section 360bbb-3(b)(1), unless the authorization is terminated or revoked.  Performed at [facility], [place] [address], [place], [place] [address]   Blood culture (routine x 2)     Status: None   Collection Time: 06/24/21  4:34 PM   Specimen: BLOOD  Result Value Ref Range Status   Specimen Description BLOOD SITE NOT SPECIFIED  Final   Special Requests   Final    BOTTLES DRAWN AEROBIC AND ANAEROBIC Blood Culture results may not be optimal due to an inadequate volume of blood received in culture bottles   Culture   Final    NO GROWTH 5 DAYS Performed at [facility], [place] [address], [place], [place] [address]    Report Status 06/29/2021 FINAL  Final  Surgical PCR screen     Status: None   Collection Time: 06/26/21  4:16 PM   Specimen: Nasal Mucosa; Nasal Swab  Result Value Ref Range Status   MRSA, PCR NEGATIVE NEGATIVE Final   Staphylococcus aureus NEGATIVE NEGATIVE Final    Comment: (NOTE) The Xpert SA Assay (FDA approved for NASAL specimens in patients 57 years of age and older), is one component of a comprehensive surveillance program. It is not intended to diagnose infection nor to guide or monitor treatment. Performed at [facility], [place] [address], [place], [place] [address]   Fungus Culture With Stain     Status: None (Preliminary result)   Collection Time: 06/27/21 11:39 AM   Specimen: Soft Tissue, Other  Result Value Ref Range Status   Fungus Stain Final report  Final    Comment: (NOTE) Performed At: [facility] [place], [place] [id number] Rush Farmer MD [id number]    Fungus (Mycology) Culture PENDING  Incomplete   Fungal Source TIS PROXIMAL 5TH METATARSAL INFECTED BONE SPEC A  Final    Comment: Performed at [facility], [place] [address], [place] [address]  Aerobic/Anaerobic Culture w Gram Stain (surgical/deep wound)     Status:  None (Preliminary result)   Collection Time: 06/27/21 11:39 AM   Specimen: Soft Tissue, Other  Result Value Ref Range Status   Specimen Description TISSUE  Final   Special Requests PROXIMAL 5TH METATARSAL INFECTED BONE SPEC A  Final   Gram Stain  Final    RARE WBC PRESENT,BOTH PMN AND MONONUCLEAR NO ORGANISMS SEEN Performed at [facility], [place] [address] [place], [place] [address]    Culture   Final    RARE CORYNEBACTERIUM SPECIES Standardized susceptibility testing for this organism is not available. NO ANAEROBES ISOLATED; CULTURE IN PROGRESS FOR 5 DAYS    Report Status PENDING  Incomplete  Fungus Culture Result     Status: None   Collection Time: 06/27/21 11:39 AM  Result Value Ref Range Status   Result 1 Comment  Final    Comment: (NOTE) KOH/Calcofluor preparation:  no fungus observed. Performed At: [facility] [place], [place] [id number] Rush Farmer MD [id number]          Radiology Studies: No results found.      Scheduled Meds:  amLODipine  5 mg Oral q AM   aspirin EC  81 mg Oral q AM   bictegravir-emtricitabine-tenofovir AF  1 tablet Oral QHS   dextrose  1 Tube Oral Once   docusate sodium  100 mg Oral BID   enoxaparin (LOVENOX) injection  40 mg Subcutaneous Q24H   feeding supplement  296 mL Oral Once   gabapentin  100 mg Oral QID   hydrochlorothiazide  25 mg Oral Daily   insulin aspart  0-9 Units Subcutaneous TID WC   lisinopril  20 mg Oral Daily   mupirocin ointment  1 application Nasal BID   norethindrone  5 mg Oral BID   traMADol  50 mg Oral Q6H   Continuous Infusions:  cefTRIAXone (ROCEPHIN)  IV 2 g (06/28/21 2320)   dextrose 5 % and 0.9% NaCl 75 mL/hr at 06/29/21 1335   vancomycin 1,250 mg (06/28/21 1846)    Assessment & Plan:   Principal Problem:   Osteomyelitis of left foot (HCC) Active Problems:   HTN (hypertension)   HIV (human immunodeficiency virus infection) ([place])   HLD (hyperlipidemia)    Elevated LFTs   Acute-on-chronic kidney injury ([place])   Diabetes mellitus type 2, uncontrolled, with complications ([place])   Diabetic nephropathy (HCC)   CKD (chronic kidney disease), stage III ([place])   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 Routinely followed at [facility], Dr. Sharol Given for debridement of her chronic ulcer. -blood cx pending -received vanc and zosyn in ER. -continue empiric therapy with vanc and rocephin  -MRI ordered left foot--- please see full report.  There is concern for osteo-. -wound care consult while in hospital for ulcer 7/13 status post irrigation debridement of the left foot ulcer.  Status post wound VAC in place -7/11 ID consulted Follow-up cultures continue IV antibiotic      HTN (hypertension) BP on low side. Continue amlodipine Hold HCTZ and lisinopril especially in setting of AKI and receiving IV fluid       DM2 (diabetes mellitus, type 2) (HCC) A1c= 8.8 -currently on trulicity and humalog, amaryl, (hold).   -7/12 patient became hypoglycemic this a.m. secondary to being in surgery yesterday and eating very little overnight and this a.m.  DC all hypoglycemics except below 7/13 BG stable continue to monitor  Elevated LFTs -no hx of elevated LFTs. -? Side effect from HIV meds, but no hx of elevated LFTs in past.  -7/12 given that patient is post surgery 1 day  would monitor over next 2 days.  If continues to climb then would obtain abdominal ultrasound 7/13 its starting to trend down.  We will continue to monitor if it does not improve will obtain right upper quadrant ultrasound     Acute-on-CKD stage III (baseline Cr 1.4) -appears a little dry, likely pre renal. Improving slowly Continue IV antibiotics baseline has varied from  1.01-1.71 over past year, but average is around 1.4. -avoid NSAIDs  Hold lisinopril and HCTZ  has NSAID in med list, will need to educate not to use these   HIV (human immunodeficiency virus infection) ([place]) -last viral load undetectable in June of 2022 -follows regularly with Dr. Baxter Flattery, ID. -continue her Biktarvy, watch LFTs.      HLD (hyperlipidemia)  -lipid panel 05/2021 with LDL of 78 -Given patient's increased liver enzymes hold statins.  - 7/11 LDL 65    DVT prophylaxis: Lovenox Code Status: Full Family Communication: None at bedside Disposition Plan:  Status is: Inpatient  Remains inpatient appropriate because:Inpatient level of care appropriate due to severity of illness  Dispo: The patient is from: Home              Anticipated d/c is to:  Home versus SNF              Patient currently is not medically stable to d/c.   Difficult to place patient No            LOS: 5 days   Time spent: 35 minutes with more than 50% on [place], MD [facility] Pager [phone]  If 7PM-7AM, please contact night-coverage 06/29/2021, 2:42 PM

## 2021-06-30 ENCOUNTER — Inpatient Hospital Stay (HOSPITAL_COMMUNITY): Payer: Medicare Other

## 2021-06-30 DIAGNOSIS — N1831 Chronic kidney disease, stage 3a: Secondary | ICD-10-CM

## 2021-06-30 DIAGNOSIS — B20 Human immunodeficiency virus [HIV] disease: Secondary | ICD-10-CM | POA: Diagnosis not present

## 2021-06-30 DIAGNOSIS — E118 Type 2 diabetes mellitus with unspecified complications: Secondary | ICD-10-CM | POA: Diagnosis not present

## 2021-06-30 DIAGNOSIS — E08621 Diabetes mellitus due to underlying condition with foot ulcer: Secondary | ICD-10-CM | POA: Diagnosis not present

## 2021-06-30 LAB — HEPATITIS B CORE ANTIBODY, TOTAL: Hep B Core Total Ab: NONREACTIVE

## 2021-06-30 LAB — COMPREHENSIVE METABOLIC PANEL
ALT: 391 U/L — ABNORMAL HIGH (ref 0–44)
AST: 215 U/L — ABNORMAL HIGH (ref 15–41)
Albumin: 2.7 g/dL — ABNORMAL LOW (ref 3.5–5.0)
Alkaline Phosphatase: 44 U/L (ref 38–126)
Anion gap: 6 (ref 5–15)
BUN: 10 mg/dL (ref 6–20)
CO2: 22 mmol/L (ref 22–32)
Calcium: 8.4 mg/dL — ABNORMAL LOW (ref 8.9–10.3)
Chloride: 107 mmol/L (ref 98–111)
Creatinine, Ser: 1.29 mg/dL — ABNORMAL HIGH (ref 0.44–1.00)
GFR, Estimated: 49 mL/min — ABNORMAL LOW (ref >60)
Glucose, Bld: 246 mg/dL — ABNORMAL HIGH (ref 70–99)
Potassium: 4.3 mmol/L (ref 3.5–5.1)
Sodium: 135 mmol/L (ref 135–145)
Total Bilirubin: 0.8 mg/dL (ref 0.3–1.2)
Total Protein: 6.6 g/dL (ref 6.5–8.1)

## 2021-06-30 LAB — GLUCOSE, CAPILLARY
Glucose-Capillary: 203 mg/dL — ABNORMAL HIGH (ref 70–99)
Glucose-Capillary: 203 mg/dL — ABNORMAL HIGH (ref 70–99)
Glucose-Capillary: 214 mg/dL — ABNORMAL HIGH (ref 70–99)
Glucose-Capillary: 235 mg/dL — ABNORMAL HIGH (ref 70–99)
Glucose-Capillary: 261 mg/dL — ABNORMAL HIGH (ref 70–99)

## 2021-06-30 LAB — CBC WITH DIFFERENTIAL/PLATELET
Abs Immature Granulocytes: 0.04 10*3/uL (ref 0.00–0.07)
Basophils Absolute: 0.1 10*3/uL (ref 0.0–0.1)
Basophils Relative: 1 %
Eosinophils Absolute: 0.5 10*3/uL (ref 0.0–0.5)
Eosinophils Relative: 5 %
HCT: 39.5 % (ref 36.0–46.0)
Hemoglobin: 13.2 g/dL (ref 12.0–15.0)
Immature Granulocytes: 0 %
Lymphocytes Relative: 23 %
Lymphs Abs: 2.3 10*3/uL (ref 0.7–4.0)
MCH: 31.7 pg (ref 26.0–34.0)
MCHC: 33.4 g/dL (ref 30.0–36.0)
MCV: 94.7 fL (ref 80.0–100.0)
Monocytes Absolute: 0.9 10*3/uL (ref 0.1–1.0)
Monocytes Relative: 8 %
Neutro Abs: 6.6 10*3/uL (ref 1.7–7.7)
Neutrophils Relative %: 63 %
Platelets: 374 10*3/uL (ref 150–400)
RBC: 4.17 MIL/uL (ref 3.87–5.11)
RDW: 13.3 % (ref 11.5–15.5)
WBC: 10.4 10*3/uL (ref 4.0–10.5)
nRBC: 0 % (ref 0.0–0.2)

## 2021-06-30 LAB — HEPATITIS B SURFACE ANTIGEN: Hepatitis B Surface Ag: NONREACTIVE

## 2021-06-30 LAB — CK: Total CK: 398 U/L — ABNORMAL HIGH (ref 38–234)

## 2021-06-30 LAB — PHOSPHORUS: Phosphorus: 2.5 mg/dL (ref 2.5–4.6)

## 2021-06-30 LAB — C-REACTIVE PROTEIN: CRP: 0.9 mg/dL (ref <1.0)

## 2021-06-30 LAB — HEPATITIS C ANTIBODY: HCV Ab: NONREACTIVE

## 2021-06-30 LAB — MAGNESIUM: Magnesium: 1.7 mg/dL (ref 1.7–2.4)

## 2021-06-30 LAB — SEDIMENTATION RATE: Sed Rate: 74 mm/hr — ABNORMAL HIGH (ref 0–22)

## 2021-06-30 IMAGING — US US ABDOMEN LIMITED
1 series · 14 of 25 positions shown · non-contrast
Comparison: None.

CLINICAL DATA: Elevated LFTs

EXAM:
ULTRASOUND ABDOMEN LIMITED RIGHT UPPER QUADRANT

[Series 1: us abdomen limited ruq (liver/gb) · 14 of 42 slices shown]
[im 1/42]
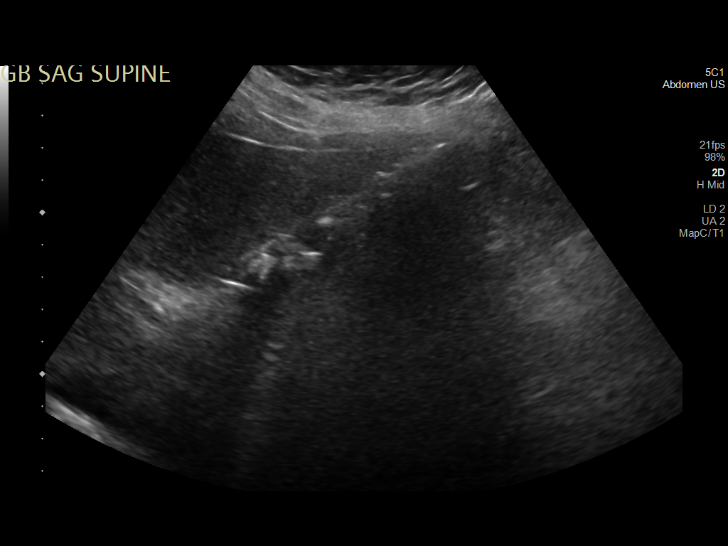
[im 4/42]
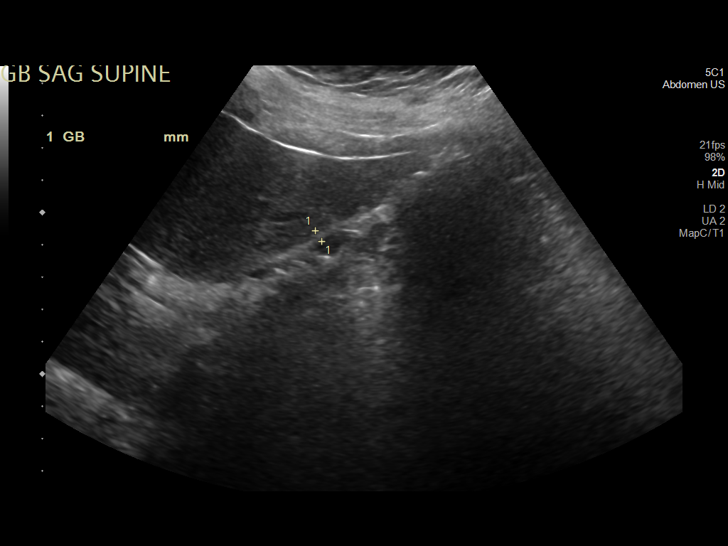
[im 7/42]
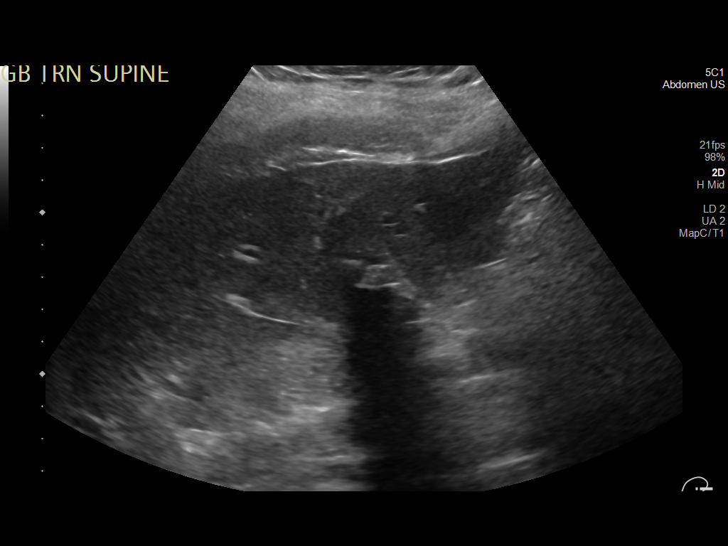
[im 11/42]
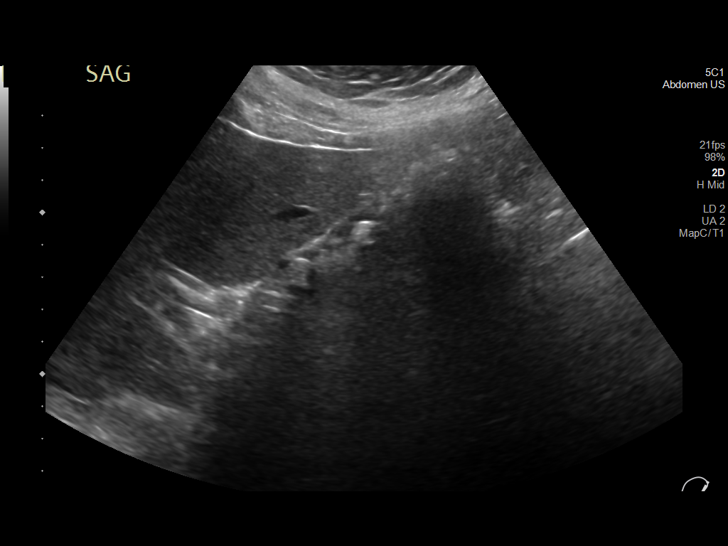
[im 14/42]
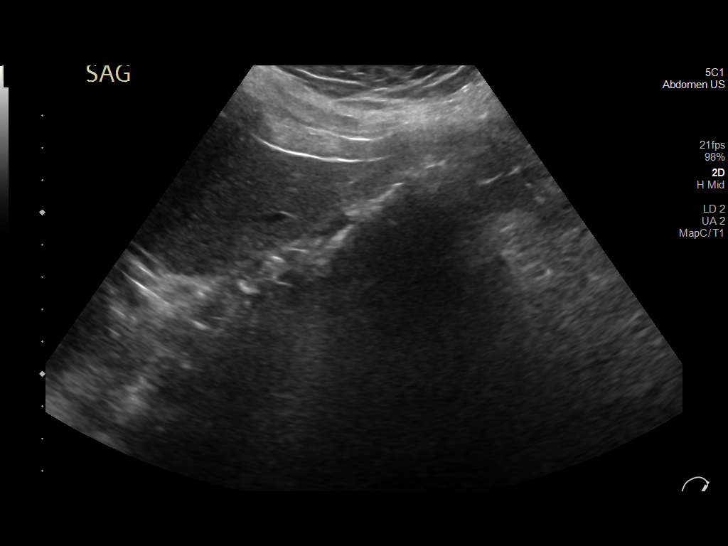
[im 16/42]
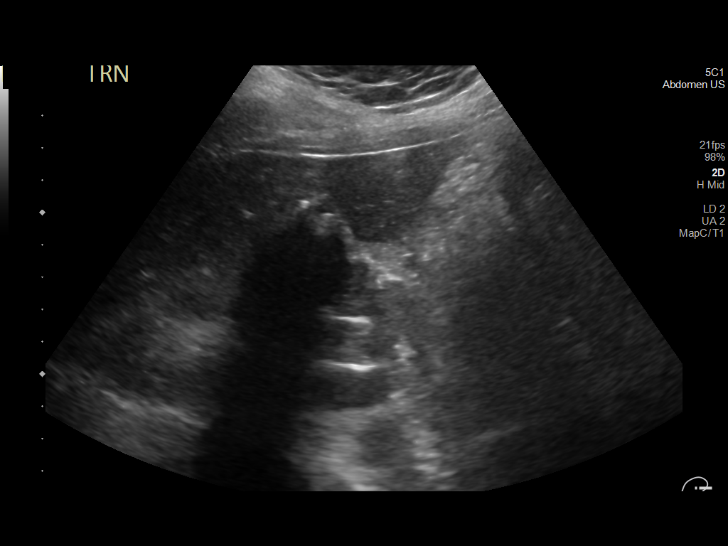
[im 19/42]
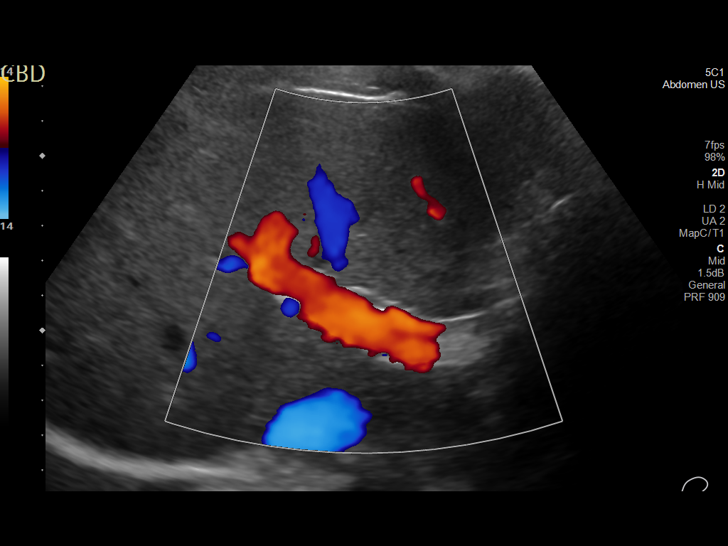
[im 23/42]
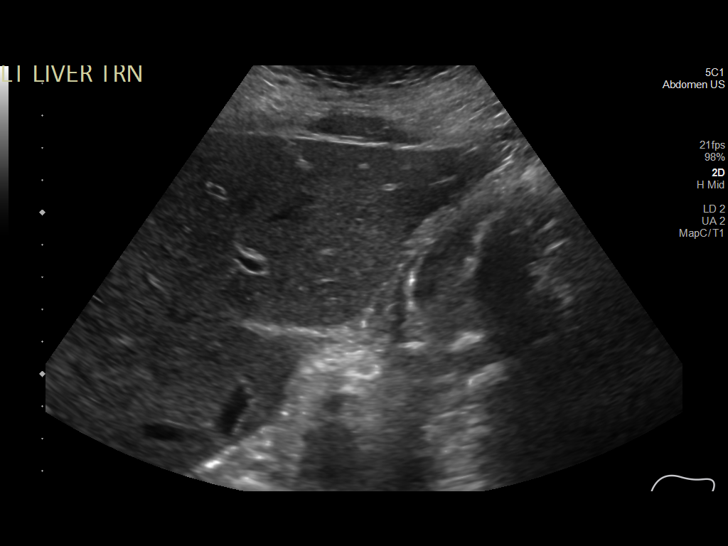
[im 26/42]
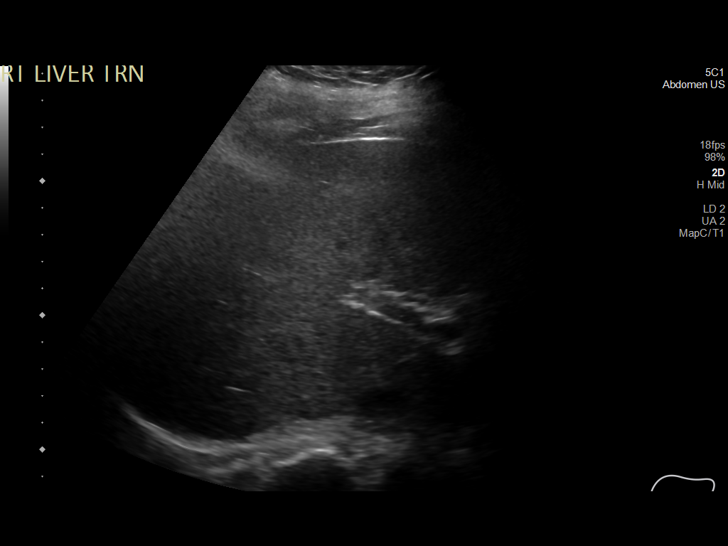
[im 28/42]
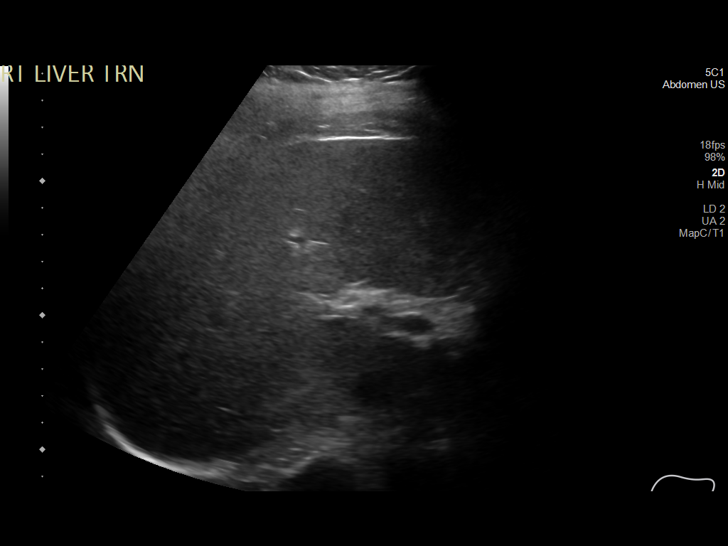
[im 31/42]
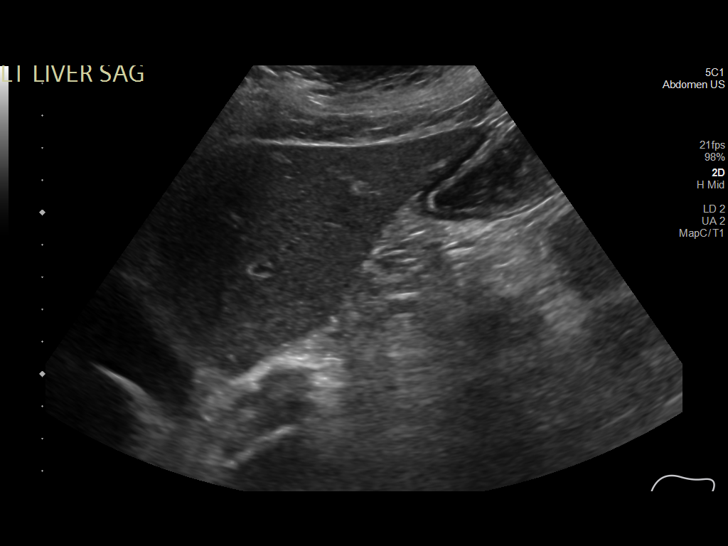
[im 35/42]
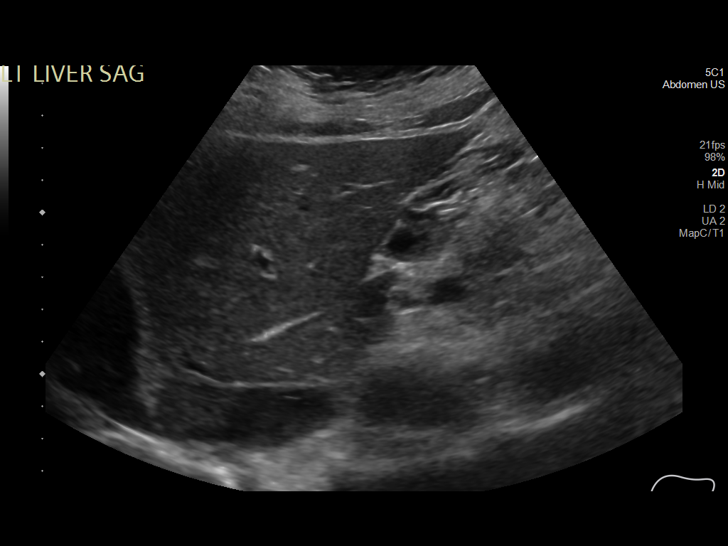
[im 38/42]
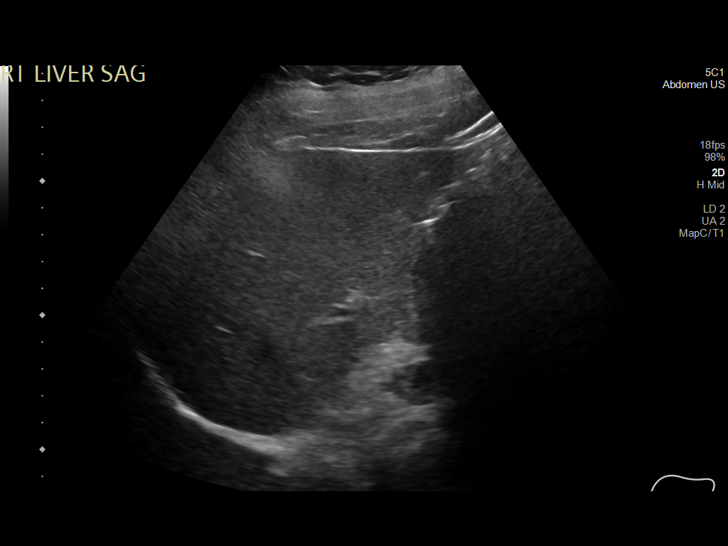
[im 42/42]
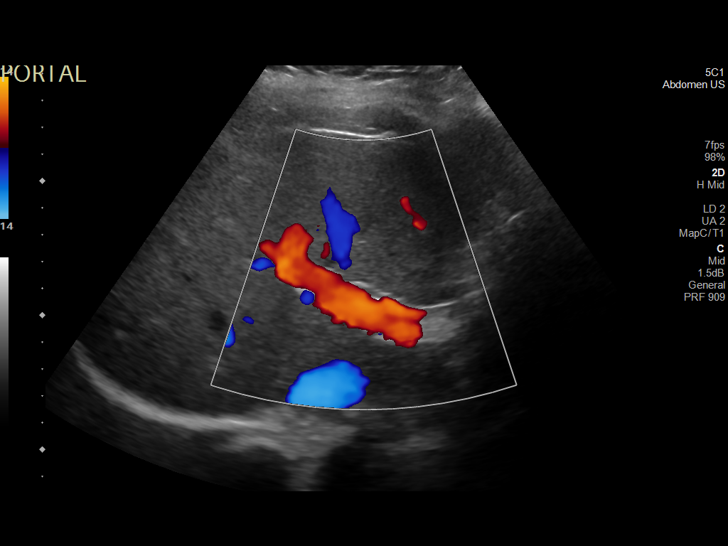

[14 of 25 positions shown; findings below may reference images not displayed]

FINDINGS: Gallbladder:

Decompressed but filled with gallstones. No pericholecystic fluid or
sonographic Murphy's sign is elicited.

Common bile duct:

Diameter: 5.3 mm

Liver:

No focal lesion identified. Within normal limits in parenchymal
echogenicity. Portal vein is patent on color Doppler imaging with
normal direction of blood flow towards the liver.

Other: None.
IMPRESSION: Cholelithiasis within a decompressed gallbladder. No complicating
factors are noted.

## 2021-06-30 MED ORDER — ENOXAPARIN SODIUM 60 MG/0.6ML IJ SOSY
60.0000 mg | PREFILLED_SYRINGE | INTRAMUSCULAR | Status: DC
Start: 2021-06-30 — End: 2021-07-04
  Administered 2021-06-30 – 2021-07-03 (×4): 60 mg via SUBCUTANEOUS
  Filled 2021-06-30 (×4): qty 0.6

## 2021-06-30 MED ORDER — SODIUM CHLORIDE 0.9% FLUSH
10.0000 mL | INTRAVENOUS | Status: DC | PRN
  Administered 2021-07-04: 10 mL

## 2021-06-30 MED ORDER — SODIUM CHLORIDE 0.9% FLUSH: 10.0000 mL | Freq: Two times a day (BID) | INTRAVENOUS | Status: DC

## 2021-06-30 MED ORDER — INSULIN GLARGINE 100 UNIT/ML ~~LOC~~ SOLN
5.0000 [IU] | Freq: Every day | SUBCUTANEOUS | Status: DC
  Administered 2021-06-30 – 2021-07-01 (×2): 5 [IU] via SUBCUTANEOUS
  Filled 2021-06-30 (×2): qty 0.05

## 2021-06-30 MED ORDER — CHLORHEXIDINE GLUCONATE CLOTH 2 % EX PADS
6.0000 | MEDICATED_PAD | Freq: Every day | CUTANEOUS | Status: DC
  Administered 2021-06-30 – 2021-07-04 (×5): 6 via TOPICAL

## 2021-06-30 NOTE — Progress Notes (Signed)
Peripherally Inserted Central Catheter Placement  The IV Nurse has discussed with the patient and/or persons authorized to consent for the patient, the purpose of this procedure and the potential benefits and risks involved with this procedure.  The benefits include less needle sticks, lab draws from the catheter, and the patient may be discharged home with the catheter. Risks include, but not limited to, infection, bleeding, blood clot (thrombus formation), and puncture of an artery; nerve damage and irregular heartbeat and possibility to perform a PICC exchange if needed/ordered by physician.  Alternatives to this procedure were also discussed.  Bard Power PICC patient education guide, fact sheet on infection prevention and patient information card has been provided to patient /or left at bedside.    PICC Placement Documentation  PICC Single Lumen 12/81/18 Right Cephalic 41 cm 0 cm (Active)  Indication for Insertion or Continuance of Line Home intravenous therapies (PICC only) 06/29/21 1400  Exposed Catheter (cm) 0 cm 06/29/21 1400  Site Assessment Clean;Dry;Intact 06/29/21 1400  Line Status Flushed;Saline locked;Blood return noted 06/29/21 1400  Dressing Type Transparent;Securing device 06/29/21 1400  Dressing Status Clean;Dry;Intact 06/29/21 1400  Antimicrobial disc in place? Yes 06/29/21 1400  Safety Lock Not Applicable 86/77/37 3668  Line Care Connections checked and tightened 06/29/21 1400  Dressing Intervention New dressing 06/29/21 1400  Dressing Change Due 07/07/21 06/29/21 1400       Holley Bouche Renee 06/30/2021, 3:08 PM

## 2021-06-30 NOTE — Progress Notes (Signed)
Occupational Therapy Treatment Patient Details Name: Katrina Wolfe MRN: [id number] DOB: 1966/03/16 Today's Date: 06/30/2021    History of present illness 55 y.o. 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 She has a history of an ORIF of the fifth metatarsal with subsequent removal of hardware secondary to infection. She reported she has been feeling bad  with nausea, chills, headaches and more drainage from her ulcer that has a foul odor and  has also had more pain and that thw odor was new. Pt now s/p I & D of L foot ulcer   OT comments  Pt making progress with functional goals. Sessio focused on bed mobility to sit EOB, sit - stand transitions to RW maintaining NWB, SPT to BSC, toileting tasks, SPT to recliner, UB and LB bathing, grooming and UB dressing. OT will continue to follow acutely to maximize level of function and safety  Follow Up Recommendations  SNF (pt refusing SNF and will need HH)    Equipment Recommendations  3 in 1 bedside commode;Tub/shower bench;Wheelchair (measurements OT);Wheelchair cushion (measurements OT);Other (comment) Management consultant)    Recommendations for Other Services      Precautions / Restrictions Precautions Precaution Comments: wound vac Restrictions Weight Bearing Restrictions: Yes LLE Weight Bearing: Non weight bearing       Mobility Bed Mobility Overal bed mobility: Needs Assistance Bed Mobility: Supine to Sit                Transfers Overall transfer level: Needs assistance Equipment used: 1 person hand held assist;Rolling walker (2 wheeled) Transfers: Sit to/from [facility] Sit to Stand: Min assist;Min guard Stand pivot transfers: Min guard       General transfer comment: pt used RW to SPT to [facility] and recliner    Balance Overall balance assessment: Needs  assistance Sitting-balance support: Feet supported;No upper extremity supported Sitting balance-Leahy Scale: Good     Standing balance support: Bilateral upper extremity supported;During functional activity Standing balance-Leahy Scale: Poor                             ADL either performed or assessed with clinical judgement   ADL Overall ADL's : Needs assistance/impaired     Grooming: Wash/dry hands;Wash/dry face;Set up;Sitting   Upper Body Bathing: Set up;Supervision/ safety;Sitting   Lower Body Bathing: Moderate assistance;Minimal assistance;Sitting/lateral leans   Upper Body Dressing : Set up;Supervision/safety;Sitting   Lower Body Dressing: Moderate assistance;Sitting/lateral leans   Toilet Transfer: Minimal assistance;Min guard;RW;Stand-pivot;BSC;Cueing for safety   Toileting- Clothing Manipulation and Hygiene: Moderate assistance;Sit to/from stand       Functional mobility during ADLs: Minimal assistance;Min guard;Rolling walker;Cueing for sequencing;Cueing for safety General ADL Comments: Pt participated in grooming, bathing and dressing seated in recliner     Vision Wears Glasses: Reading only Patient Visual Report: No change from baseline     Perception     Praxis      Cognition Arousal/Alertness: Awake/alert Behavior During Therapy: WFL for tasks assessed/performed Overall Cognitive Status: Within Functional Limits for tasks assessed                                          Exercises     Shoulder  Instructions       General Comments      Pertinent Vitals/ Pain       Pain Assessment: Faces Faces Pain Scale: Hurts little more Pain Location: L foot Pain Descriptors / Indicators: Operative site guarding Pain Intervention(s): Monitored during session;Repositioned  Home Living                                          Prior Functioning/Environment              Frequency  Min 2X/week         Progress Toward Goals  OT Goals(current goals can now be found in the care plan section)  Progress towards OT goals: Progressing toward goals  Acute Rehab OT Goals Patient Stated Goal: go home and get therapy  Plan Discharge plan remains appropriate    Co-evaluation                 AM-PAC OT "6 Clicks" Daily Activity     Outcome Measure   Help from another person eating meals?: None Help from another person taking care of personal grooming?: A Little Help from another person toileting, which includes using toliet, bedpan, or urinal?: A Lot Help from another person bathing (including washing, rinsing, drying)?: A Lot Help from another person to put on and taking off regular upper body clothing?: None Help from another person to put on and taking off regular lower body clothing?: A Lot 6 Click Score: 17    End of Session Equipment Utilized During Treatment: Gait belt;Rolling walker;Other (comment) (BSC)  OT Visit Diagnosis: Unsteadiness on feet (R26.81);Other abnormalities of gait and mobility (R26.89);Pain;Muscle weakness (generalized) (M62.81) Pain - Right/Left: Left Pain - part of body: Ankle and joints of foot   Activity Tolerance Patient tolerated treatment well   Patient Left in chair;with call bell/phone within reach   Nurse Communication          Time: 1287-8676 OT Time Calculation (min): 29 min  Charges: OT General Charges $OT Visit: 1 Visit OT Treatments $Self Care/Home Management : 8-22 mins $Therapeutic Activity: 8-22 mins     Britt Bottom 06/30/2021, 1:37 PM

## 2021-06-30 NOTE — Progress Notes (Signed)
Inpatient Diabetes Program Recommendations  AACE/ADA: New Consensus Statement on Inpatient Glycemic Control (2015)  Target Ranges:  Prepandial:   less than 140 mg/dL      Peak postprandial:   less than 180 mg/dL (1-2 hours)      Critically ill patients:  140 - 180 mg/dL   Lab Results  Component Value Date   GLUCAP 214 (H) 06/30/2021   HGBA1C 8.8 (H) 06/24/2021    Review of Glycemic Control Results for LADA, FULBRIGHT (MRN [id number]) as of 06/30/2021 12:00  Ref. Range 06/30/2021 04:18 06/30/2021 08:19 06/30/2021 11:48  Glucose-Capillary Latest Ref Range: 70 - 99 mg/dL 203 (H) 203 (H) 214 (H)   Diabetes history: DM 2 Outpatient Diabetes medications:  Amaryl 2 mg bid Trulicity 3 mg every Friday Humalog 75/25 60 units with breakfast and at bedtime Current orders for Inpatient glycemic control:  Novolog sensitive tid with meals Inpatient Diabetes Program Recommendations:   Consider adding Levemir 10 units bid.  Thanks,  Adah Perl, RN, BC-ADM Inpatient Diabetes Coordinator Pager [phone] (8a-5p)

## 2021-06-30 NOTE — Progress Notes (Signed)
Physical Therapy Treatment Patient Details Name: Katrina Wolfe MRN: [id number] DOB: 1966-03-10 Today's Date: 06/30/2021    History of Present Illness The pt is a 55 y.o. female presenting 7/8 due to poorly healing L foot wound. The pt is now s/p I&D of L foot ulcer on 7/11 due to osteomyelitis of L foot. PMH includes: ORIF of 5th metatarsal and subsequent removal of hardware due to infection, HTN, HIV, HLD, DM II, and CKD III.    PT Comments    The pt was eager to participate in PT session with focus on continued OOB mobility. The pt was able to demo improvements in bed mobility, completing without physical assist. The pt was able to complete multiple sit-stand transfers with use of RW and increased momentum, but limited physical assist. The pt was able to initiate some hopping with RLE, but tends to stand in hyperextension with RLE. The pt was then educated in HEP for R ankle and LE strengthening with handout provided. Will continue to benefit from skilled PT to progress OOB mobility, strength, and independence with transfers. Will be safe to return home with DME listed below and 24/7 family assist.     Follow Up Recommendations  Home health PT;Supervision for mobility/OOB     Equipment Recommendations  Wheelchair (measurements PT);Wheelchair cushion (measurements PT);Rolling walker with 5" wheels    Recommendations for Other Services       Precautions / Restrictions Precautions Precautions: Fall Precaution Comments: wound vac Restrictions Weight Bearing Restrictions: Yes LLE Weight Bearing: Non weight bearing    Mobility  Bed Mobility Overal bed mobility: Modified Independent Bed Mobility: Supine to Sit     Supine to sit: Modified independent (Device/Increase time)     General bed mobility comments: pt able to come to sitting EOB without assist, does use bed rails to complete    Transfers Overall transfer level: Needs assistance Equipment used: Rolling walker (2  wheeled) Transfers: Sit to/from [facility] Sit to Stand: Min guard Stand pivot transfers: Min guard       General transfer comment: pt using RW and momentum to power up to standing. cues for hand positioning, improved power up from chair compared to bed  Ambulation/Gait Ambulation/Gait assistance: Min guard Gait Distance (Feet): 3 Feet (+ 4 ft) Assistive device: Rolling walker (2 wheeled) Gait Pattern/deviations: Trunk flexed Gait velocity: reduced Gait velocity interpretation: <1.31 ft/sec, indicative of household ambulator General Gait Details: pt with initial use of heel-toe pattern, but able to initiate some hopping on RLE. limited clearacne, standing on RLE in hyperextension   Stairs             Wheelchair Mobility    Modified Rankin (Stroke Patients Only)       Balance Overall balance assessment: Needs assistance Sitting-balance support: Feet supported;No upper extremity supported Sitting balance-Leahy Scale: Good     Standing balance support: Bilateral upper extremity supported;During functional activity Standing balance-Leahy Scale: Poor Standing balance comment: reliant on BUE support to maintain WB                            Cognition Arousal/Alertness: Awake/alert Behavior During Therapy: WFL for tasks assessed/performed Overall Cognitive Status: Within Functional Limits for tasks assessed                                        Exercises General  Exercises - Lower Extremity Long Arc Quad: AROM;Right;10 reps;Seated Toe Raises: AROM;Right;10 reps;Limitations;Seated Toe Raises Limitations: with max cues for eversion to maintain neutral position Heel Raises: AROM;Right;10 reps;Seated Other Exercises Other Exercises: R ankle eversion x 10 Other Exercises: R ankle drawing alphabet x1    General Comments General comments (skin integrity, edema, etc.): VSS, wound vac intact      Pertinent Vitals/Pain Pain  Assessment: Faces Faces Pain Scale: Hurts a little bit Pain Location: L foot Pain Descriptors / Indicators: Operative site guarding Pain Intervention(s): Limited activity within patient's tolerance;Monitored during session;Repositioned    Home Living                      Prior Function            PT Goals (current goals can now be found in the care plan section) Acute Rehab PT Goals Patient Stated Goal: go home and get therapy PT Goal Formulation: With patient Time For Goal Achievement: 07/12/21 Potential to Achieve Goals: Good Progress towards PT goals: Progressing toward goals    Frequency    Min 3X/week      PT Plan Discharge plan needs to be updated    Co-evaluation              AM-PAC PT "6 Clicks" Mobility   Outcome Measure  Help needed turning from your back to your side while in a flat bed without using bedrails?: A Little Help needed moving from lying on your back to sitting on the side of a flat bed without using bedrails?: A Little Help needed moving to and from a bed to a chair (including a wheelchair)?: A Little Help needed standing up from a chair using your arms (e.g., wheelchair or bedside chair)?: A Little Help needed to walk in hospital room?: A Little Help needed climbing 3-5 steps with a railing? : A Lot 6 Click Score: 17    End of Session Equipment Utilized During Treatment: Gait belt Activity Tolerance: Patient limited by fatigue;Patient limited by pain Patient left: in chair;with call bell/phone within reach Nurse Communication: Mobility status PT Visit Diagnosis: Muscle weakness (generalized) (M62.81);Difficulty in walking, not elsewhere classified (R26.2);Pain Pain - Right/Left: Left Pain - part of body: Ankle and joints of foot     Time: 1658-1730 PT Time Calculation (min) (ACUTE ONLY): 32 min  Charges:  $Gait Training: 8-22 mins $Therapeutic Exercise: 8-22 mins                     Inocencio Homes, PT, DPT    Acute Rehabilitation Department Pager #: [phone]   Otho Bellows 06/30/2021, 5:57 PM

## 2021-06-30 NOTE — Progress Notes (Signed)
[facility]  Date of Admission:  06/24/2021      Total days of antibiotics 6    ASSESSMENT: Katrina Wolfe is a 55 y.o. 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 She has had worsening of the foot ulceration with systemic symptoms including nausea, chills, headaches and increased foul drainage. She was taken to the OR by Dr. Louanne Skye for debridement. PICC line has been ordered for outpatient treatment. Give prior antibiotic exposure before OR would treat more broadly with daptomycin + Ceftriaxone. Planning discharge coordination with [facility] team.   Acutely elevated LFTs, up trending - would be pretty rare finding r/t HIV medications and would look for more common alternative explanations. Viral hepatitis serologies negative.  Would continue Biktarvy for now. Consider checking liver ultrasound. She has no rashes or localizing symptoms at present.   PT recommended SNF but she has requested home discharge.     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, with necrosis of bone (HCC)    amLODipine  5 mg Oral q AM   aspirin EC  81 mg Oral q AM   bictegravir-emtricitabine-tenofovir AF  1 tablet Oral QHS   Chlorhexidine Gluconate Cloth  6 each Topical Daily   dextrose  1 Tube Oral Once   docusate sodium  100 mg Oral BID    enoxaparin (LOVENOX) injection  60 mg Subcutaneous Q24H   feeding supplement  296 mL Oral Once   gabapentin  100 mg Oral QID   insulin aspart  0-9 Units Subcutaneous TID WC   lisinopril  10 mg Oral Daily   mupirocin ointment  1 application Nasal BID   norethindrone  5 mg Oral BID   sodium chloride flush  10-40 mL Intracatheter Q12H   traMADol  50 mg Oral Q6H    SUBJECTIVE: She is feeling well and without concerns today.  Awaiting for PICC line to be placed. Planning for smaller vac to ready her for discharge.    Review of Systems: Review of Systems  Constitutional:  Negative for chills and fever.  HENT:  Negative for tinnitus.   Eyes:  Negative for blurred vision and photophobia.  Respiratory:  Negative for cough and sputum production.   Cardiovascular:  Negative for chest pain.  Gastrointestinal:  Negative for diarrhea, nausea and vomiting.  Genitourinary:  Negative for dysuria.  Skin:  Negative for rash.  Neurological:  Negative for headaches.  All other systems reviewed and are negative.  Allergies  Allergen Reactions   Shellfish Allergy Anaphylaxis and Swelling   Bactrim [Sulfamethoxazole-Trimethoprim] Hives    OBJECTIVE: Vitals:   06/29/21 1206 06/29/21 1949 06/30/21 0821 06/30/21 1211  BP: 129/78 (!) 126/56 122/68 131/88  Pulse: 78 83 82 68  Resp:  17 18 18   Temp: 98.1 F (36.7 C) 98.5 F (36.9 C) 99.2 F (37.3 C) 98.1 F (36.7 C)  TempSrc: Oral Oral Oral Oral  SpO2: 100% 97% 96% 96%  Weight:      Height:       Body mass index is 40.17 kg/m.  Physical Exam Vitals reviewed.  Constitutional:      Appearance: She is well-developed.     Comments: Resting quietly in bed watching TV. Pleasant.   HENT:     Mouth/Throat:     Mouth: No oral lesions.     Dentition: Normal dentition. No dental abscesses.     Pharynx: No oropharyngeal exudate.  Cardiovascular:     Rate and Rhythm: Normal rate and regular rhythm.     Heart sounds: Normal heart sounds.   Pulmonary:     Effort: Pulmonary effort is normal.     Breath sounds: Normal breath sounds.  Abdominal:     General: There is no distension.     Palpations: Abdomen is soft.     Tenderness: There is no abdominal tenderness.  Musculoskeletal:     Comments: L foot with internally rotated deformity. Wound vac is intact with scant serosanguinous drainage in place.   Lymphadenopathy:     Cervical: No cervical adenopathy.  Skin:    General: Skin is warm and dry.     Findings: No rash.  Neurological:     Mental Status: She is alert and oriented to person, place, and time.  Psychiatric:        Judgment: Judgment normal.    Lab Results Lab Results  Component Value Date   WBC 10.4 06/30/2021   HGB 13.2 06/30/2021   HCT 39.5 06/30/2021   MCV 94.7 06/30/2021   PLT 374 06/30/2021    Lab Results  Component Value Date   CREATININE 1.29 (H) 06/30/2021   BUN 10 06/30/2021   NA 135 06/30/2021   K 4.3 06/30/2021   CL 107 06/30/2021   CO2 22 06/30/2021    Lab Results  Component Value Date   ALT 391 (H) 06/30/2021   AST 215 (H) 06/30/2021   ALKPHOS 44 06/30/2021   BILITOT 0.8 06/30/2021     Microbiology: Recent Results (from the past 240 hour(s))  Blood culture (routine x 2)     Status: None   Collection Time: 06/24/21  4:29 PM   Specimen: BLOOD  Result Value Ref Range Status   Specimen Description BLOOD LEFT ANTECUBITAL  Final   Special Requests   Final    BOTTLES DRAWN AEROBIC AND ANAEROBIC Blood Culture adequate volume   Culture   Final    NO GROWTH 5 DAYS Performed at [facility], [address] [place], [place] [address]    Report Status 06/29/2021 FINAL  Final  Resp Panel by RT-PCR (Flu A&B, Covid) Nasopharyngeal Swab     Status: None   Collection Time: 06/24/21  4:29 PM   Specimen: Nasopharyngeal Swab; Nasopharyngeal(NP) swabs in vial transport medium  Result Value Ref Range Status   SARS Coronavirus 2 by RT PCR NEGATIVE NEGATIVE Final    Comment:  (NOTE) SARS-CoV-2 target nucleic acids are NOT DETECTED.  The SARS-CoV-2 RNA is generally detectable in upper respiratory specimens during the acute phase of infection. The lowest concentration of SARS-CoV-2 viral copies this assay can detect is 138 copies/mL. A negative result does not preclude SARS-Cov-2 infection and should not be used as the sole basis for treatment or other patient management decisions. A negative result may occur with  improper specimen collection/handling, submission of specimen other than nasopharyngeal swab, presence of viral  mutation(s) within the areas targeted by this assay, and inadequate number of viral copies(<138 copies/mL). A negative result must be combined with clinical observations, patient history, and epidemiological information. The expected result is Negative.  Fact Sheet for Patients:  EntrepreneurPulse.com.au  Fact Sheet for Healthcare Providers:  IncredibleEmployment.be  This test is no t yet approved or cleared by the [place] FDA and  has been authorized for detection and/or diagnosis of SARS-CoV-2 by FDA under an Emergency Use Authorization (EUA). This EUA will remain  in effect (meaning this test can be used) for the duration of the COVID-19 declaration under Section 564(b)(1) of the Act, 21 U.S.C.section 360bbb-3(b)(1), unless the authorization is terminated  or revoked sooner.       Influenza A by PCR NEGATIVE NEGATIVE Final   Influenza B by PCR NEGATIVE NEGATIVE Final    Comment: (NOTE) The Xpert Xpress SARS-CoV-2/FLU/RSV plus assay is intended as an aid in the diagnosis of influenza from Nasopharyngeal swab specimens and should not be used as a sole basis for treatment. Nasal washings and aspirates are unacceptable for Xpert Xpress SARS-CoV-2/FLU/RSV testing.  Fact Sheet for Patients: EntrepreneurPulse.com.au  Fact Sheet for Healthcare  Providers: IncredibleEmployment.be  This test is not yet approved or cleared by the [place] FDA and has been authorized for detection and/or diagnosis of SARS-CoV-2 by FDA under an Emergency Use Authorization (EUA). This EUA will remain in effect (meaning this test can be used) for the duration of the COVID-19 declaration under Section 564(b)(1) of the Act, 21 U.S.C. section 360bbb-3(b)(1), unless the authorization is terminated or revoked.  Performed at [facility], [place] [address], [place], [place] [address]   Blood culture (routine x 2)     Status: None   Collection Time: 06/24/21  4:34 PM   Specimen: BLOOD  Result Value Ref Range Status   Specimen Description BLOOD SITE NOT SPECIFIED  Final   Special Requests   Final    BOTTLES DRAWN AEROBIC AND ANAEROBIC Blood Culture results may not be optimal due to an inadequate volume of blood received in culture bottles   Culture   Final    NO GROWTH 5 DAYS Performed at [facility], [place] [address] [place], [place] [address]    Report Status 06/29/2021 FINAL  Final  Surgical PCR screen     Status: None   Collection Time: 06/26/21  4:16 PM   Specimen: Nasal Mucosa; Nasal Swab  Result Value Ref Range Status   MRSA, PCR NEGATIVE NEGATIVE Final   Staphylococcus aureus NEGATIVE NEGATIVE Final    Comment: (NOTE) The Xpert SA Assay (FDA approved for NASAL specimens in patients 69 years of age and older), is one component of a comprehensive surveillance program. It is not intended to diagnose infection nor to guide or monitor treatment. Performed at [facility], [place] [address], [place], [place] [address]   Fungus Culture With Stain     Status: None (Preliminary result)   Collection Time: 06/27/21 11:39 AM   Specimen: Soft Tissue, Other  Result Value Ref Range Status   Fungus Stain Final report  Final    Comment: (NOTE) Performed At: [facility] [place], [place]  [id number] Rush Farmer MD [id number]    Fungus (Mycology) Culture PENDING  Incomplete   Fungal Source TIS PROXIMAL 5TH METATARSAL INFECTED BONE SPEC A  Final    Comment: Performed at [facility], [place] [address] [place], [place] [address]  Aerobic/Anaerobic  Culture w Gram Stain (surgical/deep wound)     Status: None (Preliminary result)   Collection Time: 06/27/21 11:39 AM   Specimen: Soft Tissue, Other  Result Value Ref Range Status   Specimen Description TISSUE  Final   Special Requests PROXIMAL 5TH METATARSAL INFECTED BONE SPEC A  Final   Gram Stain   Final    RARE WBC PRESENT,BOTH PMN AND MONONUCLEAR NO ORGANISMS SEEN Performed at [facility], [address], [place], [place] [address]    Culture   Final    RARE CORYNEBACTERIUM SPECIES Standardized susceptibility testing for this organism is not available. NO ANAEROBES ISOLATED; CULTURE IN PROGRESS FOR 5 DAYS    Report Status PENDING  Incomplete  Fungus Culture Result     Status: None   Collection Time: 06/27/21 11:39 AM  Result Value Ref Range Status   Result 1 Comment  Final    Comment: (NOTE) KOH/Calcofluor preparation:  no fungus observed. Performed At: [facility] [address] [place], [place] [id number] Rush Farmer MD [id number]      Janene Madeira, MSN, NP-C [facility] [email or web address] Pager: [phone] Office: [phone] [place]: [phone]

## 2021-06-30 NOTE — Progress Notes (Signed)
PROGRESS NOTE    Katrina Wolfe  BLT:[id number] DOB: 18-Oct-1966 DOA: 06/24/2021 PCP: Wenda Low, MD    Brief Narrative:  55 y.o. 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 She has a history of an ORIF of the fifth metatarsal with subsequent removal of hardware secondary to infection. She states she has been feeling bad for the past week with nausea, chills, headaches and more drainage from her ulcer that has a foul odor. She has also had more pain in theThe odor is new and she states she knows something is wrong when it smells this way. She denies any fevers, cough, shortness of breath, chest pain, diarrhea or vomiting. She has some discomfort in her left lower abdomen, but no pain. She has been compliant with her medication. ED Course: vitals: bp: 134/82, HR: 70, afebrile, RR: 18, oxygen: 100% room air. Labs pertinent for elevated WBC to 16.5, elevated LFTS and creatinine of 1.80. xray with chronic osteomyelitis of left foot. Asked to admit.    7/14 no overnight issues. BG elevated.    Consultants:  Orthopedics, ID   Procedures:   Antimicrobials:  Ceftriaxone vancomycin   Subjective: Has no complaints of chills, dizziness, sob, or cp  Objective: Vitals:   06/29/21 0539 06/29/21 0816 06/29/21 1206 06/29/21 1949  BP: (!) 104/53 121/60 129/78 (!) 126/56  Pulse:  84 78 83  Resp:  18  17  Temp:  98 F (36.7 C) 98.1 F (36.7 C) 98.5 F (36.9 C)  TempSrc:  Oral Oral Oral  SpO2:  96% 100% 97%  Weight:      Height:        Intake/Output Summary (Last 24 hours) at 06/30/2021 0724 Last data filed at 06/30/2021 0422 Gross per 24 hour  Intake 1405 ml  Output --  Net 1405 ml   Filed Weights   06/24/21 1929  Weight: 125.2 kg    Examination: Nad, calm Cta no w/r Regular s1/s2 no gallop Soft  benign +bs RLE scd in place. LLE wrapped , drainage with pinkish red output Aaxox4, grossly intact  Data Reviewed: I have personally reviewed following labs and imaging studies  CBC: Recent Labs  Lab 06/26/21 0032 06/27/21 0221 06/27/21 0927 06/28/21 0258 06/29/21 0434 06/30/21 0204  WBC 12.1* 15.3* 11.1* 10.6* 10.0 10.4  NEUTROABS 8.7* 7.9*  --  6.6 6.5 6.6  HGB 13.8 14.6 14.9 13.7 13.0 13.2  HCT 40.9 44.1 45.4 41.0 40.5 39.5  MCV 93.8 94.6 94.0 94.9 96.2 94.7  PLT 435* 475* 477* 423* 390 007   Basic Metabolic Panel: Recent Labs  Lab 06/26/21 0032 06/27/21 0221 06/28/21 0258 06/29/21 0434 06/30/21 0204  NA 135 136 137 135 135  K 4.1 3.5 3.7 3.9 4.3  CL 104 106 108 107 107  CO2 22 20* $Remo'23 23 22  'djQnM$ GLUCOSE 188* 50* 71 128* 246*  BUN $Re'13 13 13 11 10  'vEb$ CREATININE 1.48* 1.54* 1.62* 1.41* 1.29*  CALCIUM 8.7* 9.0 8.6* 8.2* 8.4*  MG 1.9 2.0 1.8 1.8 1.7  PHOS 3.2 2.3* 2.5 3.2 2.5   GFR: Estimated Creatinine Clearance: 71.2 mL/min (A) (by C-G formula based on SCr of 1.29 mg/dL (H)). Liver Function Tests: Recent Labs  Lab 06/26/21 0032 06/27/21 0221 06/28/21 0258 06/29/21 0434 06/30/21 0204  AST 138* 183* 187* 172* 215*  ALT 171* 257* 303* 309* 391*  ALKPHOS 36* 39 34* 38 44  BILITOT 0.7 0.5 0.6 0.5 0.8  PROT 6.7 6.9 6.6 6.5 6.6  ALBUMIN 2.7* 2.9* 2.7* 2.7* 2.7*   No results for input(s): LIPASE, AMYLASE in the last 168 hours. No results for input(s): AMMONIA in the last 168 hours. Coagulation Profile: No results for input(s): INR, PROTIME in the last 168 hours. Cardiac Enzymes: No results for input(s): CKTOTAL, CKMB, CKMBINDEX, TROPONINI in the last 168 hours. BNP (last 3 results) No results for input(s): PROBNP in the last 8760 hours. HbA1C: No results for input(s): HGBA1C in the last 72 hours. CBG: Recent Labs  Lab 06/29/21 0601 06/29/21 1206 06/29/21 1601 06/29/21 2124 06/30/21 0418  GLUCAP 107* 127* 205* 245* 203*   Lipid Profile: No results for  input(s): CHOL, HDL, LDLCALC, TRIG, CHOLHDL, LDLDIRECT in the last 72 hours.  Thyroid Function Tests: No results for input(s): TSH, T4TOTAL, FREET4, T3FREE, THYROIDAB in the last 72 hours. Anemia Panel: No results for input(s): VITAMINB12, FOLATE, FERRITIN, TIBC, IRON, RETICCTPCT in the last 72 hours. Sepsis Labs: Recent Labs  Lab 06/24/21 1648 06/25/21 2038 06/26/21 0032 06/27/21 0221  PROCALCITON  --  <0.10 <0.10 <0.10  LATICACIDVEN 1.3 1.2 1.1  --     Recent Results (from the past 240 hour(s))  Blood culture (routine x 2)     Status: None   Collection Time: 06/24/21  4:29 PM   Specimen: BLOOD  Result Value Ref Range Status   Specimen Description BLOOD LEFT ANTECUBITAL  Final   Special Requests   Final    BOTTLES DRAWN AEROBIC AND ANAEROBIC Blood Culture adequate volume   Culture   Final    NO GROWTH 5 DAYS Performed at [facility], [address], [place], [place] [address]    Report Status 06/29/2021 FINAL  Final  Resp Panel by RT-PCR (Flu A&B, Covid) Nasopharyngeal Swab     Status: None   Collection Time: 06/24/21  4:29 PM   Specimen: Nasopharyngeal Swab; Nasopharyngeal(NP) swabs in vial transport medium  Result Value Ref Range Status   SARS Coronavirus 2 by RT PCR NEGATIVE NEGATIVE Final    Comment: (NOTE) SARS-CoV-2 target nucleic acids are NOT DETECTED.  The SARS-CoV-2 RNA is generally detectable in upper respiratory specimens during the acute phase of infection. The lowest concentration of SARS-CoV-2 viral copies this assay can detect is 138 copies/mL. A negative result does not preclude SARS-Cov-2 infection and should not be used as the sole basis for treatment or other patient management decisions. A negative result may occur with  improper specimen collection/handling, submission of specimen other than nasopharyngeal swab, presence of viral mutation(s) within the areas targeted by this assay, and inadequate number of viral copies(<138 copies/mL). A  negative result must be combined with clinical observations, patient history, and epidemiological information. The expected result is Negative.  Fact Sheet for Patients:  EntrepreneurPulse.com.au  Fact Sheet for Healthcare Providers:  IncredibleEmployment.be  This test is no t yet approved or cleared by the [place] FDA and  has been authorized for detection and/or diagnosis of SARS-CoV-2 by FDA under an Emergency Use Authorization (EUA). This EUA will remain  in effect (meaning this test can be used) for the duration of the COVID-19 declaration under Section 564(b)(1) of the Act, 21 U.S.C.section 360bbb-3(b)(1), unless the authorization is terminated  or revoked sooner.       Influenza A by PCR NEGATIVE NEGATIVE Final   Influenza B by PCR  NEGATIVE NEGATIVE Final    Comment: (NOTE) The Xpert Xpress SARS-CoV-2/FLU/RSV plus assay is intended as an aid in the diagnosis of influenza from Nasopharyngeal swab specimens and should not be used as a sole basis for treatment. Nasal washings and aspirates are unacceptable for Xpert Xpress SARS-CoV-2/FLU/RSV testing.  Fact Sheet for Patients: EntrepreneurPulse.com.au  Fact Sheet for Healthcare Providers: IncredibleEmployment.be  This test is not yet approved or cleared by the [place] FDA and has been authorized for detection and/or diagnosis of SARS-CoV-2 by FDA under an Emergency Use Authorization (EUA). This EUA will remain in effect (meaning this test can be used) for the duration of the COVID-19 declaration under Section 564(b)(1) of the Act, 21 U.S.C. section 360bbb-3(b)(1), unless the authorization is terminated or revoked.  Performed at [facility], [place] [address], [place], [place] [address]   Blood culture (routine x 2)     Status: None   Collection Time: 06/24/21  4:34 PM   Specimen: BLOOD  Result Value Ref Range Status   Specimen  Description BLOOD SITE NOT SPECIFIED  Final   Special Requests   Final    BOTTLES DRAWN AEROBIC AND ANAEROBIC Blood Culture results may not be optimal due to an inadequate volume of blood received in culture bottles   Culture   Final    NO GROWTH 5 DAYS Performed at [facility], [place] [address], [place], [place] [address]    Report Status 06/29/2021 FINAL  Final  Surgical PCR screen     Status: None   Collection Time: 06/26/21  4:16 PM   Specimen: Nasal Mucosa; Nasal Swab  Result Value Ref Range Status   MRSA, PCR NEGATIVE NEGATIVE Final   Staphylococcus aureus NEGATIVE NEGATIVE Final    Comment: (NOTE) The Xpert SA Assay (FDA approved for NASAL specimens in patients 9 years of age and older), is one component of a comprehensive surveillance program. It is not intended to diagnose infection nor to guide or monitor treatment. Performed at [facility], [place] [address], [place], [place] [address]   Fungus Culture With Stain     Status: None (Preliminary result)   Collection Time: 06/27/21 11:39 AM   Specimen: Soft Tissue, Other  Result Value Ref Range Status   Fungus Stain Final report  Final    Comment: (NOTE) Performed At: [facility] [place], [place] [id number] Rush Farmer MD [id number]    Fungus (Mycology) Culture PENDING  Incomplete   Fungal Source TIS PROXIMAL 5TH METATARSAL INFECTED BONE SPEC A  Final    Comment: Performed at [facility], [place] [address], [place], [place] [address]  Aerobic/Anaerobic Culture w Gram Stain (surgical/deep wound)     Status: None (Preliminary result)   Collection Time: 06/27/21 11:39 AM   Specimen: Soft Tissue, Other  Result Value Ref Range Status   Specimen Description TISSUE  Final   Special Requests PROXIMAL 5TH METATARSAL INFECTED BONE SPEC A  Final   Gram Stain   Final    RARE WBC PRESENT,BOTH PMN AND MONONUCLEAR NO ORGANISMS SEEN Performed at [facility], [address],  [place],  [address]    Culture   Final    RARE CORYNEBACTERIUM SPECIES Standardized susceptibility testing for this organism is not available. NO ANAEROBES ISOLATED; CULTURE IN PROGRESS FOR 5 DAYS    Report Status PENDING  Incomplete  Fungus Culture Result     Status: None   Collection Time: 06/27/21 11:39  AM  Result Value Ref Range Status   Result 1 Comment  Final    Comment: (NOTE) KOH/Calcofluor preparation:  no fungus observed. Performed At: [facility] [place], [place] [id number] Rush Farmer MD [id number]          Radiology Studies: [place] EKG SITE RITE  Result Date: 06/29/2021 If Site Rite image not attached, placement could not be confirmed due to current cardiac rhythm.       Scheduled Meds:  amLODipine  5 mg Oral q AM   aspirin EC  81 mg Oral q AM   bictegravir-emtricitabine-tenofovir AF  1 tablet Oral QHS   dextrose  1 Tube Oral Once   docusate sodium  100 mg Oral BID   enoxaparin (LOVENOX) injection  40 mg Subcutaneous Q24H   feeding supplement  296 mL Oral Once   gabapentin  100 mg Oral QID   insulin aspart  0-9 Units Subcutaneous TID WC   lisinopril  10 mg Oral Daily   mupirocin ointment  1 application Nasal BID   norethindrone  5 mg Oral BID   traMADol  50 mg Oral Q6H   Continuous Infusions:  cefTRIAXone (ROCEPHIN)  IV 2 g (06/29/21 2338)   dextrose 5 % and 0.9% NaCl 75 mL/hr at 06/30/21 0543   vancomycin 1,250 mg (06/29/21 1832)    Assessment & Plan:   Principal Problem:   Osteomyelitis of left foot (HCC) Active Problems:   HTN (hypertension)   HIV (human immunodeficiency virus infection) ([place])   HLD (hyperlipidemia)   Elevated LFTs   Acute-on-chronic kidney injury ([place])   Diabetes mellitus type 2, uncontrolled, with complications ([place])   Diabetic nephropathy ([place])   CKD (chronic kidney disease), stage III ([place])   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 Routinely followed at [facility], Dr. Sharol Given for debridement of her chronic ulcer. -received vanc and zosyn in ER. -MRI ordered left foot--- please see full report.  There is concern for osteo-. -wound care consult while in hospital for ulcer - status post irrigation debridement of the left foot ulcer.  Status post wound VAC in place -7/11 -7/14: ID consulted , input was appreciated.  Cx grew cornebacterioum, however, pt was on abx prior to her suregery. ID rec. ABI Continue vancomycin and ceftriaxone PICC line- daptomycin and ceftriaxone via picc line at discharge.  Will consult picc nursing Baseline ESR/CRP Wound care Glycemic control       HTN (hypertension) Bp on low to normotensive. Continue hold hctz and ACE, especially in setting of AKI Continue amlodipine.         DM2 (diabetes mellitus, type 2) (HCC) A1c= 8.8 -currently on trulicity and humalog, amaryl, (hold).   -7/12 patient became hypoglycemic this a.m. secondary to being in surgery yesterday and eating very little overnight and this a.m.  DC all hypoglycemics except below 7/14-BG elevated. Dc d51/2 ivf. Will add Lantus 5units qd, increase as needed if po intake improved.   Elevated LFTs -no hx of elevated LFTs. -? Side effect from HIV meds, but no hx of elevated LFTs in past.  -7/12 given that patient is post surgery 1 day would monitor over next 2 days.  If continues to climb then would obtain abdominal ultrasound 7/14- LFT elevated today.  Will obtain RUQ  [place] Ck hepatitis serology      Acute-on-CKD stage III (baseline Cr 1.4) -appears a little dry, likely pre renal. Improving with iVF Encouraged po intake D/c ivf Avoid nsaids Hold ACEI and Hctz    HIV (human immunodeficiency virus infection) ([place]) -last viral load undetectable in June  of 2022 -follows regularly with Dr. Baxter Flattery, ID. -continue her Biktarvy, watch LFTs.      HLD (hyperlipidemia)  -lipid panel 05/2021 with LDL of 78 -Given patient's increased liver enzymes hold statins.  -  LDL 65    DVT prophylaxis: Lovenox Code Status: Full Family Communication: None at bedside Disposition Plan:  Status is: Inpatient  Remains inpatient appropriate because:Inpatient level of care appropriate due to severity of illness  Dispo: The patient is from: Home              Anticipated d/c is to:  Home versus SNF ...>refusing SNF              Patient currently is not medically stable to d/c.   Difficult to place patient No            LOS: 6 days   Time spent: 35 minutes with more than 50% on [place], MD [facility] Pager [phone]  If 7PM-7AM, please contact night-coverage 06/30/2021, 7:24 AM ,

## 2021-07-01 ENCOUNTER — Encounter (HOSPITAL_COMMUNITY): Payer: Medicare Other

## 2021-07-01 LAB — COMPREHENSIVE METABOLIC PANEL
ALT: 432 U/L — ABNORMAL HIGH (ref 0–44)
AST: 211 U/L — ABNORMAL HIGH (ref 15–41)
Albumin: 2.8 g/dL — ABNORMAL LOW (ref 3.5–5.0)
Alkaline Phosphatase: 42 U/L (ref 38–126)
Anion gap: 7 (ref 5–15)
BUN: 10 mg/dL (ref 6–20)
CO2: 20 mmol/L — ABNORMAL LOW (ref 22–32)
Calcium: 8.9 mg/dL (ref 8.9–10.3)
Chloride: 107 mmol/L (ref 98–111)
Creatinine, Ser: 1.29 mg/dL — ABNORMAL HIGH (ref 0.44–1.00)
GFR, Estimated: 49 mL/min — ABNORMAL LOW (ref >60)
Glucose, Bld: 249 mg/dL — ABNORMAL HIGH (ref 70–99)
Potassium: 4.3 mmol/L (ref 3.5–5.1)
Sodium: 134 mmol/L — ABNORMAL LOW (ref 135–145)
Total Bilirubin: 0.4 mg/dL (ref 0.3–1.2)
Total Protein: 7.1 g/dL (ref 6.5–8.1)

## 2021-07-01 LAB — CBC WITH DIFFERENTIAL/PLATELET
Abs Immature Granulocytes: 0.03 10*3/uL (ref 0.00–0.07)
Basophils Absolute: 0 10*3/uL (ref 0.0–0.1)
Basophils Relative: 0 %
Eosinophils Absolute: 0.5 10*3/uL (ref 0.0–0.5)
Eosinophils Relative: 5 %
HCT: 38.9 % (ref 36.0–46.0)
Hemoglobin: 12.8 g/dL (ref 12.0–15.0)
Immature Granulocytes: 0 %
Lymphocytes Relative: 20 %
Lymphs Abs: 2.1 10*3/uL (ref 0.7–4.0)
MCH: 31.7 pg (ref 26.0–34.0)
MCHC: 32.9 g/dL (ref 30.0–36.0)
MCV: 96.3 fL (ref 80.0–100.0)
Monocytes Absolute: 1 10*3/uL (ref 0.1–1.0)
Monocytes Relative: 9 %
Neutro Abs: 7 10*3/uL (ref 1.7–7.7)
Neutrophils Relative %: 66 %
Platelets: 385 10*3/uL (ref 150–400)
RBC: 4.04 MIL/uL (ref 3.87–5.11)
RDW: 13.3 % (ref 11.5–15.5)
WBC: 10.6 10*3/uL — ABNORMAL HIGH (ref 4.0–10.5)
nRBC: 0 % (ref 0.0–0.2)

## 2021-07-01 LAB — PHOSPHORUS: Phosphorus: 2.6 mg/dL (ref 2.5–4.6)

## 2021-07-01 LAB — GLUCOSE, CAPILLARY
Glucose-Capillary: 197 mg/dL — ABNORMAL HIGH (ref 70–99)
Glucose-Capillary: 191 mg/dL — ABNORMAL HIGH (ref 70–99)
Glucose-Capillary: 246 mg/dL — ABNORMAL HIGH (ref 70–99)
Glucose-Capillary: 273 mg/dL — ABNORMAL HIGH (ref 70–99)

## 2021-07-01 LAB — HEPATITIS B SURFACE ANTIBODY, QUANTITATIVE: Hep B S AB Quant (Post): <3.1 m[IU]/mL — ABNORMAL LOW (ref >9.9)

## 2021-07-01 LAB — GAMMA GT: GGT: 36 U/L (ref 7–50)

## 2021-07-01 LAB — MAGNESIUM: Magnesium: 1.7 mg/dL (ref 1.7–2.4)

## 2021-07-01 MED ORDER — INSULIN ASPART 100 UNIT/ML IJ SOLN
0.0000 [IU] | Freq: Every day | INTRAMUSCULAR | Status: DC
  Administered 2021-07-01: 3 [IU] via SUBCUTANEOUS
  Administered 2021-07-02: 4 [IU] via SUBCUTANEOUS
  Administered 2021-07-03: 3 [IU] via SUBCUTANEOUS

## 2021-07-01 MED ORDER — SODIUM CHLORIDE 0.9 % IV SOLN
8.0000 mg/kg | Freq: Every day | INTRAVENOUS | Status: DC
  Administered 2021-07-01 – 2021-07-04 (×4): 700 mg via INTRAVENOUS
  Filled 2021-07-01 (×4): qty 14

## 2021-07-01 MED ORDER — INSULIN GLARGINE 100 UNIT/ML ~~LOC~~ SOLN
10.0000 [IU] | Freq: Every day | SUBCUTANEOUS | Status: DC
  Administered 2021-07-02 – 2021-07-03 (×2): 10 [IU] via SUBCUTANEOUS
  Filled 2021-07-01 (×2): qty 0.1

## 2021-07-01 MED ORDER — INSULIN ASPART 100 UNIT/ML IJ SOLN
0.0000 [IU] | Freq: Three times a day (TID) | INTRAMUSCULAR | Status: DC
  Administered 2021-07-01: 5 [IU] via SUBCUTANEOUS
  Administered 2021-07-02: 8 [IU] via SUBCUTANEOUS
  Administered 2021-07-02: 5 [IU] via SUBCUTANEOUS
  Administered 2021-07-02: 8 [IU] via SUBCUTANEOUS
  Administered 2021-07-03: 5 [IU] via SUBCUTANEOUS
  Administered 2021-07-03 (×2): 8 [IU] via SUBCUTANEOUS
  Administered 2021-07-04: 3 [IU] via SUBCUTANEOUS
  Administered 2021-07-04: 8 [IU] via SUBCUTANEOUS

## 2021-07-01 MED ORDER — INSULIN ASPART 100 UNIT/ML IJ SOLN: 0.0000 [IU] | Freq: Three times a day (TID) | INTRAMUSCULAR | Status: DC

## 2021-07-01 NOTE — Progress Notes (Signed)
Register for Infectious Disease  Date of Admission:  06/24/2021      Total days of antibiotics 7   Daptomycin  Ceftriaxone     ASSESSMENT: Katrina Wolfe is a 55 y.o. 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 Worsening ulceration with malodorous drainage and systemic symptoms now s/p debridement.  PICC line in place and ready to use. She stated she needed a nurse to be matched for her and that was holding up discharge. D/W Dr. Loleta Books and will try to discharge today if possible pending barriers for home health/home PT.   Newly elevated AST/ALT - present on arrival with current admission, whereas 1 month ago they were normal. Ultrasound without any explanation identified (has asymptomatic gallstones with fully decompressed GB), viral hepatitis labs negative, normal GGT. Mildly elevated CK. D/W Dr. Loleta Books - ?muscle breakdown process and not primarily originating from liver given GGT and all other indices are normal. Can follow trend again in 1 week. Will arrange appt with GI for further assistance with work up if needed.   With mild elevated CK, will need to be careful on Daptomycin - we will have her follow up with telehealth call next week in ID clinic and have opportunity to review her labs 2x weekly for now. Hold statin at D/C (primary prevention with h/o DM).   PT recommended SNF but she has requested home discharge.     PLAN: Daptomycin + Ceftriaxone with OPAT below  Continue biktarvy  FU in 1 week via telephone to review labs and S/E to medications.  Hold statin for now   OPAT ORDERS:  Diagnosis: DFU/osteomyelitis   Culture Result: corynebacterium striatum, (on abx prior to cultures)  Allergies  Allergen Reactions   Shellfish Allergy Anaphylaxis and Swelling   Bactrim [Sulfamethoxazole-Trimethoprim] Hives     Discharge antibiotics to be given via PICC  line:  Per pharmacy protocol DAPTOMYCIN 8g/kg/d + Ceftriaxone 2 gm IV QD   Duration: 6 weeks   End Date: 08/08/21  [facility] Care Per Protocol with [employer] Use: Home health RN for IV administration and teaching, line care and labs.    Labs weekly while on IV antibiotics: _x_ CBC with differential __ BMP _x_ CMP _x_ CRP _x_ ESR __ Vancomycin trough _x_ CK  **TWICE WEEKLY  __ Please pull PIC at completion of IV antibiotics __ Please leave PIC in place until doctor has seen patient or been notified  Fax weekly labs to [phone]  Clinic Follow Up Appt: 07/06/2021 @ 10:30 with Dr. 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, with necrosis of bone (H[facility])    amLODipine  5 mg Oral q AM   aspirin EC  81 mg Oral q AM   bictegravir-emtricitabine-tenofovir AF  1 tablet Oral QHS   Chlorhexidine Gluconate Cloth  6 each Topical Daily   dextrose  1 Tube Oral Once   docusate sodium  100 mg Oral BID   enoxaparin (LOVENOX) injection  60  mg Subcutaneous Q24H   feeding supplement  296 mL Oral Once   gabapentin  100 mg Oral QID   insulin aspart  0-9 Units Subcutaneous TID WC   insulin glargine  5 Units Subcutaneous Daily   mupirocin ointment  1 application Nasal BID   norethindrone  5 mg Oral BID   sodium chloride flush  10-40 mL Intracatheter Q12H   traMADol  50 mg Oral Q6H    SUBJECTIVE: She is feeling well and without concerns today.  Awaiting for PICC line to be placed. Planning for smaller vac to ready her for discharge.    Review of  Systems: Review of Systems  Constitutional:  Negative for chills and fever.  HENT:  Negative for tinnitus.   Eyes:  Negative for blurred vision and photophobia.  Respiratory:  Negative for cough and sputum production.   Cardiovascular:  Negative for chest pain.  Gastrointestinal:  Negative for diarrhea, nausea and vomiting.  Genitourinary:  Negative for dysuria.  Skin:  Negative for rash.  Neurological:  Negative for headaches.  All other systems reviewed and are negative.  Allergies  Allergen Reactions   Shellfish Allergy Anaphylaxis and Swelling   Bactrim [Sulfamethoxazole-Trimethoprim] Hives    OBJECTIVE: Vitals:   06/30/21 2107 07/01/21 0650 07/01/21 0700 07/01/21 1135  BP: 135/73 (!) 120/96 135/85 (!) 149/81  Pulse: 87  80 82  Resp: $Remo'18  17 16  'KmTHm$ Temp: 98.3 F (36.8 C)  97.8 F (36.6 C) 98.2 F (36.8 C)  TempSrc:   Oral Oral  SpO2: 94%  100% 99%  Weight:      Height:       Body mass index is 40.17 kg/m.  Physical Exam Vitals reviewed.  Constitutional:      Appearance: She is well-developed.     Comments: Resting quietly in bed watching TV. Pleasant.   HENT:     Mouth/Throat:     Mouth: No oral lesions.     Dentition: Normal dentition. No dental abscesses.     Pharynx: No oropharyngeal exudate.  Cardiovascular:     Rate and Rhythm: Normal rate and regular rhythm.     Heart sounds: Normal heart sounds.  Pulmonary:     Effort: Pulmonary effort is normal.     Breath sounds: Normal breath sounds.  Abdominal:     General: There is no distension.     Palpations: Abdomen is soft.     Tenderness: There is no abdominal tenderness.  Musculoskeletal:     Comments: L foot with internally rotated deformity. Wound vac is intact with scant serosanguinous drainage in place.   Lymphadenopathy:     Cervical: No cervical adenopathy.  Skin:    General: Skin is warm and dry.     Findings: No rash.  Neurological:     Mental Status: She is alert and oriented to person,  place, and time.  Psychiatric:        Judgment: Judgment normal.    Lab Results Lab Results  Component Value Date   WBC 10.6 (H) 07/01/2021   HGB 12.8 07/01/2021   HCT 38.9 07/01/2021   MCV 96.3 07/01/2021   PLT 385 07/01/2021    Lab Results  Component Value Date   CREATININE 1.29 (H) 07/01/2021   BUN 10 07/01/2021   NA 134 (L) 07/01/2021   K 4.3 07/01/2021   CL 107 07/01/2021   CO2 20 (L) 07/01/2021    Lab Results  Component Value Date   ALT 432 (H) 07/01/2021  AST 211 (H) 07/01/2021   GGT 36 07/01/2021   ALKPHOS 42 07/01/2021   BILITOT 0.4 07/01/2021     Microbiology: Recent Results (from the past 240 hour(s))  Blood culture (routine x 2)     Status: None   Collection Time: 06/24/21  4:29 PM   Specimen: BLOOD  Result Value Ref Range Status   Specimen Description BLOOD LEFT ANTECUBITAL  Final   Special Requests   Final    BOTTLES DRAWN AEROBIC AND ANAEROBIC Blood Culture adequate volume   Culture   Final    NO GROWTH 5 DAYS Performed at [facility], [address] [place], [place] [address]    Report Status 06/29/2021 FINAL  Final  Resp Panel by RT-PCR (Flu A&B, Covid) Nasopharyngeal Swab     Status: None   Collection Time: 06/24/21  4:29 PM   Specimen: Nasopharyngeal Swab; Nasopharyngeal(NP) swabs in vial transport medium  Result Value Ref Range Status   SARS Coronavirus 2 by RT PCR NEGATIVE NEGATIVE Final    Comment: (NOTE) SARS-CoV-2 target nucleic acids are NOT DETECTED.  The SARS-CoV-2 RNA is generally detectable in upper respiratory specimens during the acute phase of infection. The lowest concentration of SARS-CoV-2 viral copies this assay can detect is 138 copies/mL. A negative result does not preclude SARS-Cov-2 infection and should not be used as the sole basis for treatment or other patient management decisions. A negative result may occur with  improper specimen collection/handling, submission of specimen other than nasopharyngeal  swab, presence of viral mutation(s) within the areas targeted by this assay, and inadequate number of viral copies(<138 copies/mL). A negative result must be combined with clinical observations, patient history, and epidemiological information. The expected result is Negative.  Fact Sheet for Patients:  EntrepreneurPulse.com.au  Fact Sheet for Healthcare Providers:  IncredibleEmployment.be  This test is no t yet approved or cleared by the [place] FDA and  has been authorized for detection and/or diagnosis of SARS-CoV-2 by FDA under an Emergency Use Authorization (EUA). This EUA will remain  in effect (meaning this test can be used) for the duration of the COVID-19 declaration under Section 564(b)(1) of the Act, 21 U.S.C.section 360bbb-3(b)(1), unless the authorization is terminated  or revoked sooner.       Influenza A by PCR NEGATIVE NEGATIVE Final   Influenza B by PCR NEGATIVE NEGATIVE Final    Comment: (NOTE) The Xpert Xpress SARS-CoV-2/FLU/RSV plus assay is intended as an aid in the diagnosis of influenza from Nasopharyngeal swab specimens and should not be used as a sole basis for treatment. Nasal washings and aspirates are unacceptable for Xpert Xpress SARS-CoV-2/FLU/RSV testing.  Fact Sheet for Patients: EntrepreneurPulse.com.au  Fact Sheet for Healthcare Providers: IncredibleEmployment.be  This test is not yet approved or cleared by the [place] FDA and has been authorized for detection and/or diagnosis of SARS-CoV-2 by FDA under an Emergency Use Authorization (EUA). This EUA will remain in effect (meaning this test can be used) for the duration of the COVID-19 declaration under Section 564(b)(1) of the Act, 21 U.S.C. section 360bbb-3(b)(1), unless the authorization is terminated or revoked.  Performed at [facility], [place] [address] [place], [place] [address]   Blood  culture (routine x 2)     Status: None   Collection Time: 06/24/21  4:34 PM   Specimen: BLOOD  Result Value Ref Range Status   Specimen Description BLOOD SITE NOT SPECIFIED  Final   Special Requests   Final  BOTTLES DRAWN AEROBIC AND ANAEROBIC Blood Culture results may not be optimal due to an inadequate volume of blood received in culture bottles   Culture   Final    NO GROWTH 5 DAYS Performed at [facility], [place] [address] [place], [place] [address]    Report Status 06/29/2021 FINAL  Final  Surgical PCR screen     Status: None   Collection Time: 06/26/21  4:16 PM   Specimen: Nasal Mucosa; Nasal Swab  Result Value Ref Range Status   MRSA, PCR NEGATIVE NEGATIVE Final   Staphylococcus aureus NEGATIVE NEGATIVE Final    Comment: (NOTE) The Xpert SA Assay (FDA approved for NASAL specimens in patients 65 years of age and older), is one component of a comprehensive surveillance program. It is not intended to diagnose infection nor to guide or monitor treatment. Performed at [facility], [place] [address], [place], [place] [address]   Fungus Culture With Stain     Status: None (Preliminary result)   Collection Time: 06/27/21 11:39 AM   Specimen: Soft Tissue, Other  Result Value Ref Range Status   Fungus Stain Final report  Final    Comment: (NOTE) Performed At: [facility] [place], [place] [id number] Rush Farmer MD [id number]    Fungus (Mycology) Culture PENDING  Incomplete   Fungal Source TIS PROXIMAL 5TH METATARSAL INFECTED BONE SPEC A  Final    Comment: Performed at [facility], [place] [address], [place], [place] [address]  Aerobic/Anaerobic Culture w Gram Stain (surgical/deep wound)     Status: None (Preliminary result)   Collection Time: 06/27/21 11:39 AM   Specimen: Soft Tissue, Other  Result Value Ref Range Status   Specimen Description TISSUE  Final   Special Requests PROXIMAL 5TH METATARSAL INFECTED BONE SPEC A  Final    Gram Stain   Final    RARE WBC PRESENT,BOTH PMN AND MONONUCLEAR NO ORGANISMS SEEN Performed at [facility], [address], [place], [place] [address]    Culture   Final    RARE CORYNEBACTERIUM SPECIES Standardized susceptibility testing for this organism is not available. NO ANAEROBES ISOLATED; CULTURE IN PROGRESS FOR 5 DAYS    Report Status PENDING  Incomplete  Fungus Culture Result     Status: None   Collection Time: 06/27/21 11:39 AM  Result Value Ref Range Status   Result 1 Comment  Final    Comment: (NOTE) KOH/Calcofluor preparation:  no fungus observed. Performed At: [facility] [address] [place], [place] [id number] Rush Farmer MD DS:[id number]      Janene Madeira, MSN, NP-C [place] [email or web address] Pager: [phone] Office: [place]: [phone]

## 2021-07-01 NOTE — Progress Notes (Signed)
Occupational Therapy Treatment Patient Details Name: Katrina Wolfe MRN: [id number] DOB: 03-17-1966 Today's Date: 07/01/2021    History of present illness The pt is a 55 y.o. female presenting 7/8 due to poorly healing L foot wound. The pt is now s/p I&D of L foot ulcer on 7/11 due to osteomyelitis of L foot. PMH includes: ORIF of 5th metatarsal and subsequent removal of hardware due to infection, HTN, HIV, HLD, DM II, and CKD III.   OT comments  Pt continues to make progress with functional goals. Session focused on ADL mobility using RW to transfer to [facility], sitting in recliner, for bathing, dressing and grooming and reviewed ADL and ADL mobility safety for home, level of assist required. Pt hopeful to d/c this afternoon or tomorrow  Follow Up Recommendations  Home health OT;Supervision - Intermittent    Equipment Recommendations  3 in 1 bedside commode;Tub/shower bench;Wheelchair (measurements OT);Wheelchair cushion (measurements OT);Other (comment) Management consultant)    Recommendations for Other Services      Precautions / Restrictions Precautions Precautions: Fall Precaution Comments: wound vac Restrictions Weight Bearing Restrictions: Yes LLE Weight Bearing: Non weight bearing       Mobility Bed Mobility Overal bed mobility: Modified Independent Bed Mobility: Supine to Sit     Supine to sit: Modified independent (Device/Increase time)     General bed mobility comments: pt able to come to sitting EOB without assist, does use bed rails to complete    Transfers Overall transfer level: Needs assistance Equipment used: Rolling walker (2 wheeled) Transfers: Sit to/from [facility] Sit to Stand: Min guard Stand pivot transfers: Min guard       General transfer comment: pt using RW and momentum to power up to standing. cues for hand positioning, improved power up from chair compared to bed    Balance Overall balance assessment: Needs assistance Sitting-balance  support: Feet supported;No upper extremity supported Sitting balance-Leahy Scale: Good     Standing balance support: Bilateral upper extremity supported;During functional activity Standing balance-Leahy Scale: Poor Standing balance comment: reliant on BUE support to maintain WB                           ADL either performed or assessed with clinical judgement   ADL Overall ADL's : Needs assistance/impaired     Grooming: Wash/dry hands;Wash/dry face;Set up;Sitting   Upper Body Bathing: Set up;Sitting   Lower Body Bathing: Minimal assistance;Sitting/lateral leans   Upper Body Dressing : Set up;Sitting   Lower Body Dressing: Minimal assistance;Sitting/lateral leans   Toilet Transfer: Min guard;RW;Stand-pivot;BSC;Cueing for safety   Toileting- Clothing Manipulation and Hygiene: Min guard;Sitting/lateral lean;Sit to/from stand       Functional mobility during ADLs: Min guard;Rolling walker;Cueing for sequencing;Cueing for safety General ADL Comments: Pt participated in grooming, bathing and dressing seated in recliner     Vision Baseline Vision/History: Wears glasses Wears Glasses: Reading only Patient Visual Report: No change from baseline     Perception     Praxis      Cognition Arousal/Alertness: Awake/alert Behavior During Therapy: WFL for tasks assessed/performed Overall Cognitive Status: Within Functional Limits for tasks assessed                                 General Comments: pt slightly decreased insight to safety, need for assist        Exercises     Shoulder Instructions  General Comments pt given handout for navigating stairs in [facility]    Pertinent Vitals/ Pain       Pain Assessment: Faces Faces Pain Scale: Hurts little more Pain Location: L foot, R knee with flexion Pain Descriptors / Indicators: Operative site guarding;Sharp;Sore Pain Intervention(s): Monitored during session;Repositioned;Premedicated before  session  Home Living                                          Prior Functioning/Environment              Frequency  Min 2X/week        Progress Toward Goals  OT Goals(current goals can now be found in the care plan section)  Progress towards OT goals: Progressing toward goals  Acute Rehab OT Goals Patient Stated Goal: go home and get therapy  Plan Discharge plan remains appropriate    Co-evaluation                 AM-PAC OT "6 Clicks" Daily Activity     Outcome Measure   Help from another person eating meals?: None Help from another person taking care of personal grooming?: None (set up) Help from another person toileting, which includes using toliet, bedpan, or urinal?: A Little Help from another person bathing (including washing, rinsing, drying)?: A Little Help from another person to put on and taking off regular upper body clothing?: None Help from another person to put on and taking off regular lower body clothing?: A Little 6 Click Score: 21    End of Session Equipment Utilized During Treatment: Gait belt;Rolling walker;Other (comment) (BSC)  OT Visit Diagnosis: Unsteadiness on feet (R26.81);Other abnormalities of gait and mobility (R26.89);Pain;Muscle weakness (generalized) (M62.81) Pain - Right/Left: Left Pain - part of body: Ankle and joints of foot   Activity Tolerance Patient tolerated treatment well   Patient Left in chair;with call bell/phone within reach   Nurse Communication          Time: 8675-4492 OT Time Calculation (min): 25 min  Charges: OT General Charges $OT Visit: 1 Visit OT Treatments $Self Care/Home Management : 8-22 mins $Therapeutic Activity: 8-22 mins     Britt Bottom 07/01/2021, 2:59 PM

## 2021-07-01 NOTE — Progress Notes (Signed)
Inpatient Diabetes Program Recommendations  AACE/ADA: New Consensus Statement on Inpatient Glycemic Control (2015)  Target Ranges:  Prepandial:   less than 140 mg/dL      Peak postprandial:   less than 180 mg/dL (1-2 hours)      Critically ill patients:  140 - 180 mg/dL   Lab Results  Component Value Date   GLUCAP 197 (H) 07/01/2021   HGBA1C 8.8 (H) 06/24/2021    Review of Glycemic Control  Results for MYLI, PAE (MRN [id number]) as of 07/01/2021 11:05  Ref. Range 06/30/2021 11:48 06/30/2021 16:01 06/30/2021 21:10 07/01/2021 06:19  Glucose-Capillary Latest Ref Range: 70 - 99 mg/dL 214 (H) 235 (H) 261 (H) 197 (H)   Diabetes history: DM 2 Outpatient Diabetes medications:  Amaryl 2 mg bid Trulicity 3 mg every Friday Humalog 75/25 60 units with breakfast and at bedtime Current orders for Inpatient glycemic control:  Novolog sensitive tid with meals, Lantus 5 units qd Inpatient Diabetes Program Recommendations:   Consider adding Levemir 10 units bid.  Thanks,  Bronson Curb, MSN, RNC-OB Diabetes Coordinator [phone] (8a-5p)

## 2021-07-01 NOTE — Plan of Care (Signed)

## 2021-07-01 NOTE — Progress Notes (Signed)
[facility] PROGRESS NOTE    Katrina Wolfe  VOH:[id number] DOB: 04/05/1966 DOA: 06/24/2021 PCP: Katrina Low, MD      Brief Narrative:  Katrina Wolfe is a 55 y.o. 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  Was at orthopedists office on day of admission for follow up of removed hardware in the left foot.    Had been having increased infection symps and was sent to the ER for admission.  In the ER, WBC 16K, Cr 1.8, and imaging showed chronic osteomyelitis of the left foot.  LFTs elevated at that time.        Assessment & Plan:  Osteomyelitis of the left foot -Continue Rocephin and daptomycin   Transaminitis AST and ALT elevated, ALT greater than AST in the setting of normal GGT implies this is from muscle source.  Noted CK is in the 300s, and the patient has daptomycin - Hold statin - Defer Biktarvy and daptomycin to infectious disease, discussed with ID team   Hypertension Blood pressure normal - Continue amlodipine - Hold lisinopril, HCTZ  HIV -Continue Biktarvy  Diabetes Glucose is elevated -Increase Lantus - Increase sliding scale corrections - Hold Farxiga, glimepiride - Continue gabapentin  Acute on chronic kidney injury, on CKD stage IIIb Baseline creatinine 1.4, creatinine up to 2.2 on admission, now back to baseline  -Hold lisinopril           Disposition: Status is: Inpatient  Remains inpatient appropriate because:Unsafe d/c plan  Dispo: The patient is from: Home              Anticipated d/c is to: Home              Patient currently is medically stable to d/c.   Difficult to place patient No   She was admitted with osteomyelitis of the left leg.  She has been evaluated by orthopedics and infectious disease, and her treatment plan is in place.  Unfortunately, she requires ongoing IV  antibiotics for safe resolution of her infection, but home health agency cannot establish care until Monday.  She will need ongoing inpatient care for IV antibiotics over the weekend for teaching of her family, and safe discharge to home on Monday for continued antibiotics.    Level of care: Med-Surg       MDM: The below labs and imaging reports were reviewed and summarized above.  Medication management as above.    DVT prophylaxis: SCDs Start: 06/27/21 1414  Code Status: FULL       Subjective: No headache, chest pain, abdominal pain, dyspnea.  No confusion, vomiting.  Objective: Vitals:   06/30/21 2107 07/01/21 0650 07/01/21 0700 07/01/21 1135  BP: 135/73 (!) 120/96 135/85 (!) 149/81  Pulse: 87  80 82  Resp: 18  17 16   Temp: 98.3 F (36.8 C)  97.8 F (36.6 C) 98.2 F (36.8 C)  TempSrc:   Oral Oral  SpO2: 94%  100% 99%  Weight:      Height:        Intake/Output Summary (Last 24 hours) at 07/01/2021 1618 Last data filed at 07/01/2021 1100 Gross per 24 hour  Intake 240 ml  Output 15 ml  Net 225 ml   Filed Weights   06/24/21 1929  Weight: 125.2 kg    Examination: General appearance:  adult female, alert and in  distress.  HEENT: Amblyopia, left eye hazy l. No nasal deformity, discharge, epistaxis.  Lips moist   Skin: Warm and dry.  no jaundice.  No suspicious rashes or lesions. Cardiac: RRR, nl S1-S2, no murmurs appreciated.  Capillary refill is brisk.   Respiratory: Normal respiratory rate and rhythm.  CTAB without rales or wheezes. Abdomen: Abdomen soft.  no TTP. No ascites, distension, hepatosplenomegaly.   MSK: No deformities or effusions.  There is a wound VAC attached to the left leg. Neuro: Awake and alert.  EOMI, she is contractured in both feet, wheelchair-bound at baseline. Speech fluent.    Psych: Sensorium intact and responding to questions, attention normal. Affect normal.  Judgment and insight appear normal.    Data Reviewed: I have  personally reviewed following labs and imaging studies:  CBC: Recent Labs  Lab 06/27/21 0221 06/27/21 0927 06/28/21 0258 06/29/21 0434 06/30/21 0204 07/01/21 0447  WBC 15.3* 11.1* 10.6* 10.0 10.4 10.6*  NEUTROABS 7.9*  --  6.6 6.5 6.6 7.0  HGB 14.6 14.9 13.7 13.0 13.2 12.8  HCT 44.1 45.4 41.0 40.5 39.5 38.9  MCV 94.6 94.0 94.9 96.2 94.7 96.3  PLT 475* 477* 423* 390 374 174   Basic Metabolic Panel: Recent Labs  Lab 06/27/21 0221 06/28/21 0258 06/29/21 0434 06/30/21 0204 07/01/21 0447  NA 136 137 135 135 134*  K 3.5 3.7 3.9 4.3 4.3  CL 106 108 107 107 107  CO2 20* 23 23 22  20*  GLUCOSE 50* 71 128* 246* 249*  BUN 13 13 11 10 10   CREATININE 1.54* 1.62* 1.41* 1.29* 1.29*  CALCIUM 9.0 8.6* 8.2* 8.4* 8.9  MG 2.0 1.8 1.8 1.7 1.7  PHOS 2.3* 2.5 3.2 2.5 2.6   GFR: Estimated Creatinine Clearance: 71.2 mL/min (A) (by C-G formula based on SCr of 1.29 mg/dL (H)). Liver Function Tests: Recent Labs  Lab 06/27/21 0221 06/28/21 0258 06/29/21 0434 06/30/21 0204 07/01/21 0447  AST 183* 187* 172* 215* 211*  ALT 257* 303* 309* 391* 432*  ALKPHOS 39 34* 38 44 42  BILITOT 0.5 0.6 0.5 0.8 0.4  PROT 6.9 6.6 6.5 6.6 7.1  ALBUMIN 2.9* 2.7* 2.7* 2.7* 2.8*   No results for input(s): LIPASE, AMYLASE in the last 168 hours. No results for input(s): AMMONIA in the last 168 hours. Coagulation Profile: No results for input(s): INR, PROTIME in the last 168 hours. Cardiac Enzymes: Recent Labs  Lab 06/30/21 1124  CKTOTAL 398*   BNP (last 3 results) No results for input(s): PROBNP in the last 8760 hours. HbA1C: No results for input(s): HGBA1C in the last 72 hours. CBG: Recent Labs  Lab 06/30/21 1148 06/30/21 1601 06/30/21 2110 07/01/21 0619 07/01/21 1119  GLUCAP 214* 235* 261* 197* 191*   Lipid Profile: No results for input(s): CHOL, HDL, LDLCALC, TRIG, CHOLHDL, LDLDIRECT in the last 72 hours. Thyroid Function Tests: No results for input(s): TSH, T4TOTAL, FREET4, T3FREE,  THYROIDAB in the last 72 hours. Anemia Panel: No results for input(s): VITAMINB12, FOLATE, FERRITIN, TIBC, IRON, RETICCTPCT in the last 72 hours. Urine analysis: No results found for: COLORURINE, APPEARANCEUR, LABSPEC, PHURINE, GLUCOSEU, HGBUR, BILIRUBINUR, KETONESUR, PROTEINUR, UROBILINOGEN, NITRITE, LEUKOCYTESUR Sepsis Labs: @LABRCNTIP (procalcitonin:4,lacticacidven:4)  ) Recent Results (from the past 240 hour(s))  Blood culture (routine x 2)     Status: None   Collection Time: 06/24/21  4:29 PM   Specimen: BLOOD  Result Value Ref Range Status   Specimen Description BLOOD LEFT ANTECUBITAL  Final   Special Requests   Final    BOTTLES  DRAWN AEROBIC AND ANAEROBIC Blood Culture adequate volume   Culture   Final    NO GROWTH 5 DAYS Performed at [facility], [place] [address], [place], [place] [address]    Report Status 06/29/2021 FINAL  Final  Resp Panel by RT-PCR (Flu A&B, Covid) Nasopharyngeal Swab     Status: None   Collection Time: 06/24/21  4:29 PM   Specimen: Nasopharyngeal Swab; Nasopharyngeal(NP) swabs in vial transport medium  Result Value Ref Range Status   SARS Coronavirus 2 by RT PCR NEGATIVE NEGATIVE Final    Comment: (NOTE) SARS-CoV-2 target nucleic acids are NOT DETECTED.  The SARS-CoV-2 RNA is generally detectable in upper respiratory specimens during the acute phase of infection. The lowest concentration of SARS-CoV-2 viral copies this assay can detect is 138 copies/mL. A negative result does not preclude SARS-Cov-2 infection and should not be used as the sole basis for treatment or other patient management decisions. A negative result may occur with  improper specimen collection/handling, submission of specimen other than nasopharyngeal swab, presence of viral mutation(s) within the areas targeted by this assay, and inadequate number of viral copies(<138 copies/mL). A negative result must be combined with clinical observations, patient history, and  epidemiological information. The expected result is Negative.  Fact Sheet for Patients:  EntrepreneurPulse.com.au  Fact Sheet for Healthcare Providers:  IncredibleEmployment.be  This test is no t yet approved or cleared by the [place] FDA and  has been authorized for detection and/or diagnosis of SARS-CoV-2 by FDA under an Emergency Use Authorization (EUA). This EUA will remain  in effect (meaning this test can be used) for the duration of the COVID-19 declaration under Section 564(b)(1) of the Act, 21 U.S.C.section 360bbb-3(b)(1), unless the authorization is terminated  or revoked sooner.       Influenza A by PCR NEGATIVE NEGATIVE Final   Influenza B by PCR NEGATIVE NEGATIVE Final    Comment: (NOTE) The Xpert Xpress SARS-CoV-2/FLU/RSV plus assay is intended as an aid in the diagnosis of influenza from Nasopharyngeal swab specimens and should not be used as a sole basis for treatment. Nasal washings and aspirates are unacceptable for Xpert Xpress SARS-CoV-2/FLU/RSV testing.  Fact Sheet for Patients: EntrepreneurPulse.com.au  Fact Sheet for Healthcare Providers: IncredibleEmployment.be  This test is not yet approved or cleared by the [place] FDA and has been authorized for detection and/or diagnosis of SARS-CoV-2 by FDA under an Emergency Use Authorization (EUA). This EUA will remain in effect (meaning this test can be used) for the duration of the COVID-19 declaration under Section 564(b)(1) of the Act, 21 U.S.C. section 360bbb-3(b)(1), unless the authorization is terminated or revoked.  Performed at [facility], [place] [address], [place], [place] [address]   Blood culture (routine x 2)     Status: None   Collection Time: 06/24/21  4:34 PM   Specimen: BLOOD  Result Value Ref Range Status   Specimen Description BLOOD SITE NOT SPECIFIED  Final   Special Requests   Final    BOTTLES  DRAWN AEROBIC AND ANAEROBIC Blood Culture results may not be optimal due to an inadequate volume of blood received in culture bottles   Culture   Final    NO GROWTH 5 DAYS Performed at [facility], [place] [address], [place], [place] [address]    Report Status 06/29/2021 FINAL  Final  Surgical PCR screen     Status: None   Collection Time: 06/26/21  4:16 PM   Specimen: Nasal  Mucosa; Nasal Swab  Result Value Ref Range Status   MRSA, PCR NEGATIVE NEGATIVE Final   Staphylococcus aureus NEGATIVE NEGATIVE Final    Comment: (NOTE) The Xpert SA Assay (FDA approved for NASAL specimens in patients 33 years of age and older), is one component of a comprehensive surveillance program. It is not intended to diagnose infection nor to guide or monitor treatment. Performed at [facility], [place] [address] [place], [place] [address]   Fungus Culture With Stain     Status: None (Preliminary result)   Collection Time: 06/27/21 11:39 AM   Specimen: Soft Tissue, Other  Result Value Ref Range Status   Fungus Stain Final report  Final    Comment: (NOTE) Performed At: [facility] [place], [place] [id number] Rush Farmer MD YO:[id number]    Fungus (Mycology) Culture PENDING  Incomplete   Fungal Source TIS PROXIMAL 5TH METATARSAL INFECTED BONE SPEC A  Final    Comment: Performed at [facility], [place] [address], [place], [place] [address]  Aerobic/Anaerobic Culture w Gram Stain (surgical/deep wound)     Status: None (Preliminary result)   Collection Time: 06/27/21 11:39 AM   Specimen: Soft Tissue, Other  Result Value Ref Range Status   Specimen Description TISSUE  Final   Special Requests PROXIMAL 5TH METATARSAL INFECTED BONE SPEC A  Final   Gram Stain   Final    RARE WBC PRESENT,BOTH PMN AND MONONUCLEAR NO ORGANISMS SEEN Performed at [facility], [address], [place], [place] [address]    Culture   Final    RARE CORYNEBACTERIUM  SPECIES Standardized susceptibility testing for this organism is not available. NO ANAEROBES ISOLATED; CULTURE IN PROGRESS FOR 5 DAYS    Report Status PENDING  Incomplete  Fungus Culture Result     Status: None   Collection Time: 06/27/21 11:39 AM  Result Value Ref Range Status   Result 1 Comment  Final    Comment: (NOTE) KOH/Calcofluor preparation:  no fungus observed. Performed At: [facility] [place], [place] [id number] Rush Farmer MD GE:[id number]          Radiology Studies: US Abdomen Limited RUQ (LIVER/GB)  Result Date: 06/30/2021 CLINICAL DATA:  Elevated LFTs EXAM: ULTRASOUND ABDOMEN LIMITED RIGHT UPPER QUADRANT COMPARISON:  None. FINDINGS: Gallbladder: Decompressed but filled with gallstones. No pericholecystic fluid or sonographic Murphy's sign is elicited. Common bile duct: Diameter: 5.3 mm Liver: No focal lesion identified. Within normal limits in parenchymal echogenicity. Portal vein is patent on color Doppler imaging with normal direction of blood flow towards the liver. Other: None. IMPRESSION: Cholelithiasis within a decompressed gallbladder. No complicating factors are noted. Electronically Signed   By: Inez Catalina M.D.   On: 06/30/2021 23:01        Scheduled Meds:  amLODipine  5 mg Oral q AM   aspirin EC  81 mg Oral q AM   bictegravir-emtricitabine-tenofovir AF  1 tablet Oral QHS   Chlorhexidine Gluconate Cloth  6 each Topical Daily   dextrose  1 Tube Oral Once   docusate sodium  100 mg Oral BID   enoxaparin (LOVENOX) injection  60 mg Subcutaneous Q24H   feeding supplement  296 mL Oral Once   gabapentin  100 mg Oral QID   insulin aspart  0-9 Units Subcutaneous TID WC   insulin glargine  5 Units Subcutaneous Daily   mupirocin ointment  1 application Nasal BID   norethindrone  5 mg Oral  BID   sodium chloride flush  10-40 mL Intracatheter Q12H   traMADol  50 mg Oral Q6H   Continuous Infusions:  cefTRIAXone (ROCEPHIN)  IV 2 g  (06/30/21 2258)   DAPTOmycin (CUBICIN)  IV       LOS: 7 days    Time spent: 25 minutes    Edwin Dada, MD [facility] 07/01/2021, 4:18 PM     Please page though [place] or Epic secure chat:  For [employer], Adult nurse

## 2021-07-01 NOTE — Progress Notes (Signed)
PHARMACY CONSULT NOTE FOR:  OUTPATIENT  PARENTERAL ANTIBIOTIC THERAPY (OPAT)  Indication: osteomyelitis  Regimen: daptomycin 700mg  IV q24h AND ceftriaxone 2g IV q24h End date: 08/08/2021  IV antibiotic discharge orders are pended. To discharging provider:  please sign these orders via discharge navigator,  Select New Orders & click on the button choice - Manage This Unsigned Work.     Thank you for allowing pharmacy to be a part of this patient's care.  Phillis Haggis 07/01/2021, 9:09 AM

## 2021-07-01 NOTE — Progress Notes (Signed)
Physical Therapy Treatment Patient Details Name: Katrina Wolfe MRN: [id number] DOB: Jun 23, 1966 Today's Date: 07/01/2021    History of Present Illness The pt is a 55 y.o. female presenting 7/8 due to poorly healing L foot wound. The pt is now s/p I&D of L foot ulcer on 7/11 due to osteomyelitis of L foot. PMH includes: ORIF of 5th metatarsal and subsequent removal of hardware due to infection, HTN, HIV, HLD, DM II, and CKD III.    PT Comments    The pt was seen for continued progression of OOB mobility, strengthening, and continued stair training to more safely manage stairs pt will need to manage to enter her home at d/c. Despite trails of different techniques and maxA/cues, the pt was unable to safely maintain NWB LLE or safe positioning of RLE for hopping up stairs. The pt was then given handout for using WC to navigate stairs, and pt verbalized understanding, reports 4 able-bodied relatives will be present to assist her upon return home. The pt will continue to benefit from skilled PT to further progress functional strength, ROM, and activity tolerance to improve independence and ability to navigate stairs, will be safe to return home with DME listed below.    Follow Up Recommendations  Home health PT;Supervision for mobility/OOB     Equipment Recommendations  Wheelchair (measurements PT);Wheelchair cushion (measurements PT);Rolling walker with 5" wheels    Recommendations for Other Services       Precautions / Restrictions Precautions Precautions: Fall Precaution Comments: wound vac Restrictions Weight Bearing Restrictions: Yes LLE Weight Bearing: Non weight bearing    Mobility  Bed Mobility Overal bed mobility: Modified Independent Bed Mobility: Supine to Sit     Supine to sit: Modified independent (Device/Increase time)     General bed mobility comments: pt able to come to sitting EOB without assist, does use bed rails to complete    Transfers Overall transfer  level: Needs assistance Equipment used: Rolling walker (2 wheeled) Transfers: Sit to/from [facility] Sit to Stand: Min guard Stand pivot transfers: Min guard       General transfer comment: pt using RW and momentum to power up to standing. cues for hand positioning, improved power up from chair compared to bed  Ambulation/Gait Ambulation/Gait assistance: Min guard Gait Distance (Feet): 3 Feet (+ 3 ft) Assistive device: Rolling walker (2 wheeled) Gait Pattern/deviations: Trunk flexed Gait velocity: reduced Gait velocity interpretation: <1.31 ft/sec, indicative of household ambulator General Gait Details: pt unable to progress to hopping on RLE at this time, minimal-no clearance with attempts. pt continues to struggle to maintain RLE in neutral, tendency for RLE foot inversion and R knee hyperextension.   Stairs Stairs: Yes Stairs assistance: Max assist;+2 physical assistance;+2 safety/equipment Stair Management: Two rails;Forwards;Step to pattern Number of Stairs: 2 General stair comments: pt with very poor clearance, heavy reliance on BUE through rails. unable to generate hopping clearance to maintain NWB LLE with stairs. pt also continues to struggle to maintain R ankle in neutral (prefers inversion) and avoid R knee hyperextension. unsafe to complete stairs with hopping technique at this time. pt given handout for [facility] navigation of stairs with assist of her sons/grandsons   Wheelchair Mobility    Modified Rankin (Stroke Patients Only)       Balance Overall balance assessment: Needs assistance Sitting-balance support: Feet supported;No upper extremity supported Sitting balance-Leahy Scale: Good     Standing balance support: Bilateral upper extremity supported;During functional activity Standing balance-Leahy Scale: Poor Standing balance comment: reliant  on BUE support to maintain WB                            Cognition Arousal/Alertness:  Awake/alert Behavior During Therapy: WFL for tasks assessed/performed Overall Cognitive Status: Within Functional Limits for tasks assessed                                 General Comments: pt slightly decreased insight to safety, need for assist. impact of RLE deficits on safety with stairs/mobility for home      Exercises      General Comments General comments (skin integrity, edema, etc.): pt given handout for navigating stairs in [facility]      Pertinent Vitals/Pain Pain Assessment: Faces Faces Pain Scale: Hurts even more Pain Location: L foot, R knee with flexion Pain Descriptors / Indicators: Operative site guarding;Sharp;Sore Pain Intervention(s): Limited activity within patient's tolerance;Monitored during session;Repositioned     PT Goals (current goals can now be found in the care plan section) Acute Rehab PT Goals Patient Stated Goal: go home and get therapy PT Goal Formulation: With patient Time For Goal Achievement: 07/12/21 Potential to Achieve Goals: Good Progress towards PT goals: Progressing toward goals    Frequency    Min 3X/week      PT Plan Current plan remains appropriate       AM-PAC PT "6 Clicks" Mobility   Outcome Measure  Help needed turning from your back to your side while in a flat bed without using bedrails?: A Little Help needed moving from lying on your back to sitting on the side of a flat bed without using bedrails?: A Little Help needed moving to and from a bed to a chair (including a wheelchair)?: A Little Help needed standing up from a chair using your arms (e.g., wheelchair or bedside chair)?: A Little Help needed to walk in hospital room?: A Lot Help needed climbing 3-5 steps with a railing? : Total 6 Click Score: 15    End of Session Equipment Utilized During Treatment: Gait belt Activity Tolerance: Patient limited by fatigue;Patient limited by pain Patient left: in chair;with call bell/phone within reach Nurse  Communication: Mobility status PT Visit Diagnosis: Muscle weakness (generalized) (M62.81);Difficulty in walking, not elsewhere classified (R26.2);Pain Pain - Right/Left: Left Pain - part of body: Ankle and joints of foot     Time: 1006-1050 PT Time Calculation (min) (ACUTE ONLY): 44 min  Charges:  $Gait Training: 8-22 mins $Therapeutic Activity: 8-22 mins $Self Care/Home Management: 8-22                     Inocencio Homes, PT, DPT   Acute Rehabilitation Department Pager #: [phone]   Otho Bellows 07/01/2021, 1:08 PM

## 2021-07-01 NOTE — TOC Progression Note (Addendum)
Transition of Care [facility]) - Progression Note    Patient Details  Name: Katrina Wolfe MRN: [id number] Date of Birth: 09/26/1966  Transition of Care [facility]) CM/SW Contact  Sharin Mons, RN Phone Number: 07/01/2021, 4:39 PM  Clinical Narrative:    - s/p I&D of L foot ulcer on 7/11 due to osteomyelitis of L foot Pt will require LT IV ABX @ D/C.  Per OPAT Indication: osteomyelitis  Regimen: daptomycin 700mg  IV q24h AND ceftriaxone 2g IV q24h End date: 08/08/2021  Pt agreeable to home health services. Pt states 63 Y/O granddaughter will assist with IV ABX therapy. Pt states granddaughter will return to hospital on tomorrow for teaching. Teaching will need to be completed prior to d/c by Pam [phone]) with [facility]. [facility] has agreed to provide [facility] ( start of care Tuesday 7/19), and [facility] will provide PT services. Pt can d/c on Monday after completion of Monday's ABX therapy.[place] 1st visit on Tuesday.  TOC team will continue to monitor and follow for needs....  Expected Discharge Plan: [place] Barriers to Discharge: Continued Medical Work up  Expected Discharge Plan and Services Expected Discharge Plan: [place]   Discharge Planning Services: CM Consult                     DME Arranged: Other see comment (IV ABX THERAPY) DME Agency: Other - Comment ([facility]) Date DME Agency Contacted: 07/01/21 Time DME Agency Contacted: [phone] Representative spoke with at DME Agency: [place]: RN, PT ([place]) [place]: [facility] ([place] PT) Date [place]: 07/01/21 Time [place]: [phone] Representative spoke with at [place]: [place] ([place]) Interventions    Readmission Risk Interventions No flowsheet data found.

## 2021-07-01 NOTE — TOC Progression Note (Signed)
Transition of Care [facility]) - Progression Note    Patient Details  Name: Katrina Wolfe MRN: [id number] Date of Birth: 06-06-1966  Transition of [facility]) CM/SW Contact  Milinda Antis, [place] Phone Number: 07/01/2021, 11:33 AM  Clinical Narrative:     10:40- CSW met with patient at bedside.  Patient is not willing to go to a SNF due to having an adult son with autism at the home who she cares for.  Patient wants to go home with home health.  TOC RNCM notified and is following patient.       Expected Discharge Plan and Services                                                 Social Determinants of Health (SDOH) Interventions    Readmission Risk Interventions No flowsheet data found.

## 2021-07-01 NOTE — Progress Notes (Signed)
Wound vac changed to preveena pump. Pump working appropriately.

## 2021-07-02 ENCOUNTER — Inpatient Hospital Stay (HOSPITAL_COMMUNITY): Payer: Medicare Other

## 2021-07-02 DIAGNOSIS — E1121 Type 2 diabetes mellitus with diabetic nephropathy: Secondary | ICD-10-CM

## 2021-07-02 DIAGNOSIS — Z21 Asymptomatic human immunodeficiency virus [HIV] infection status: Secondary | ICD-10-CM

## 2021-07-02 DIAGNOSIS — L0390 Cellulitis, unspecified: Secondary | ICD-10-CM

## 2021-07-02 DIAGNOSIS — M868X7 Other osteomyelitis, ankle and foot: Secondary | ICD-10-CM

## 2021-07-02 DIAGNOSIS — L97414 Non-pressure chronic ulcer of right heel and midfoot with necrosis of bone: Secondary | ICD-10-CM

## 2021-07-02 LAB — COMPREHENSIVE METABOLIC PANEL
ALT: 440 U/L — ABNORMAL HIGH (ref 0–44)
AST: 218 U/L — ABNORMAL HIGH (ref 15–41)
Albumin: 2.8 g/dL — ABNORMAL LOW (ref 3.5–5.0)
Alkaline Phosphatase: 39 U/L (ref 38–126)
Anion gap: 9 (ref 5–15)
BUN: 10 mg/dL (ref 6–20)
CO2: 21 mmol/L — ABNORMAL LOW (ref 22–32)
Calcium: 8.8 mg/dL — ABNORMAL LOW (ref 8.9–10.3)
Chloride: 106 mmol/L (ref 98–111)
Creatinine, Ser: 1.28 mg/dL — ABNORMAL HIGH (ref 0.44–1.00)
GFR, Estimated: 50 mL/min — ABNORMAL LOW (ref >60)
Glucose, Bld: 247 mg/dL — ABNORMAL HIGH (ref 70–99)
Potassium: 4.1 mmol/L (ref 3.5–5.1)
Sodium: 136 mmol/L (ref 135–145)
Total Bilirubin: 0.4 mg/dL (ref 0.3–1.2)
Total Protein: 6.9 g/dL (ref 6.5–8.1)

## 2021-07-02 LAB — CBC WITH DIFFERENTIAL/PLATELET
Abs Immature Granulocytes: 0.05 10*3/uL (ref 0.00–0.07)
Basophils Absolute: 0 10*3/uL (ref 0.0–0.1)
Basophils Relative: 0 %
Eosinophils Absolute: 0.5 10*3/uL (ref 0.0–0.5)
Eosinophils Relative: 4 %
HCT: 38.4 % (ref 36.0–46.0)
Hemoglobin: 12.8 g/dL (ref 12.0–15.0)
Immature Granulocytes: 0 %
Lymphocytes Relative: 21 %
Lymphs Abs: 2.4 10*3/uL (ref 0.7–4.0)
MCH: 32.1 pg (ref 26.0–34.0)
MCHC: 33.3 g/dL (ref 30.0–36.0)
MCV: 96.2 fL (ref 80.0–100.0)
Monocytes Absolute: 1 10*3/uL (ref 0.1–1.0)
Monocytes Relative: 9 %
Neutro Abs: 7.5 10*3/uL (ref 1.7–7.7)
Neutrophils Relative %: 66 %
Platelets: 379 10*3/uL (ref 150–400)
RBC: 3.99 MIL/uL (ref 3.87–5.11)
RDW: 13.4 % (ref 11.5–15.5)
WBC: 11.5 10*3/uL — ABNORMAL HIGH (ref 4.0–10.5)
nRBC: 0 % (ref 0.0–0.2)

## 2021-07-02 LAB — GLUCOSE, CAPILLARY
Glucose-Capillary: 202 mg/dL — ABNORMAL HIGH (ref 70–99)
Glucose-Capillary: 251 mg/dL — ABNORMAL HIGH (ref 70–99)
Glucose-Capillary: 279 mg/dL — ABNORMAL HIGH (ref 70–99)
Glucose-Capillary: 318 mg/dL — ABNORMAL HIGH (ref 70–99)

## 2021-07-02 LAB — CK: Total CK: 310 U/L — ABNORMAL HIGH (ref 38–234)

## 2021-07-02 LAB — MAGNESIUM: Magnesium: 1.6 mg/dL — ABNORMAL LOW (ref 1.7–2.4)

## 2021-07-02 LAB — PHOSPHORUS: Phosphorus: 2.7 mg/dL (ref 2.5–4.6)

## 2021-07-02 NOTE — Progress Notes (Addendum)
PROGRESS NOTE  Katrina Wolfe FTD:[id number] DOB: 1966-11-10 DOA: 06/24/2021 PCP: Wenda Low, MD  HPI/Recap of past 24 hours: 55 y.o. 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  She is status post ORIF of the fifth metatarsal with subsequent removal of hardware secondary to infection.  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  Osteomyelitis of the left foot Continue daptomycin and Rocephin. Orthopedic following  2.  Transaminitis. Statins on hold  3.  HIV Infectious disease consulted patient is on BIKTARVY and daptomycin  4.  Diabetes mellitus glucose is elevated continue Lantus which has just been increased Continue sliding scale Farxiga and glimepiride on hold  5.  Acute on chronic kidney injury with chronic kidney disease stage IIIb initially creatinine went up to 2.2 now back to baseline Lisinopril still on hold  Code Status: Full  Severity of Illness: The appropriate patient status for this patient is INPATIENT. Inpatient status is judged to be reasonable and necessary in order to provide the required intensity of service to ensure the patient's safety. The patient's presenting symptoms, physical exam findings, and  initial radiographic and laboratory data in the context of their chronic comorbidities is felt to place them at high risk for further clinical deterioration. Furthermore, it is not anticipated that the patient will be medically stable for discharge from the hospital within 2 midnights of admission. The following factors support the patient status of inpatient.  Stable for discharge  * I certify that at the point of admission it is my clinical judgment that the patient will require inpatient hospital care spanning beyond 2 midnights from the point of admission due to high intensity of service, high risk for further deterioration and high frequency of surveillance required.*   Family Communication: Patient  Disposition Plan: Home   Consultants: Orthopedic ID  Procedures: IRRIGATION AND DEBRIDEMENT FOOT ULCER (Left)  Antimicrobials: Ceftriaxone  DVT prophylaxis: Lovenox   Objective: Vitals:   07/01/21 0700 07/01/21 1135 07/01/21 2108 07/02/21 0700  BP: 135/85 (!) 149/81 (!) 150/77 113/79  Pulse: 80 82 76 79  Resp: 17 16 16 16   Temp: 97.8 F (36.6 C) 98.2 F (36.8 C) 98.7 F (37.1 C) 98.9 F (37.2 C)  TempSrc: Oral Oral Oral Oral  SpO2: 100% 99% 95% 98%  Weight:      Height:        Intake/Output Summary (Last 24 hours) at 07/02/2021 0913 Last data filed at 07/01/2021 1100 Gross per 24 hour  Intake 240 ml  Output --  Net 240 ml   Filed Weights   06/24/21 1929  Weight: 125.2 kg   Body mass index is 40.17 kg/m.  Exam:  General: 55 y.o. year-old female well developed well nourished in no acute distress.  Alert and oriented x3. Cardiovascular: Regular rate and  rhythm with no rubs or gallops.  No thyromegaly or JVD noted.   Respiratory: Clear to auscultation with no wheezes or rales. Good inspiratory effort. Abdomen: Soft nontender nondistended with normal bowel sounds x4 quadrants. Musculoskeletal: No lower extremity edema. 2/4 pulses in all 4 extremities. Skin:  ulcerative lesions noted with wound VAC  Psychiatry: Mood is appropriate for condition and setting    Data Reviewed: CBC: Recent Labs  Lab 06/28/21 0258 06/29/21 0434 06/30/21 0204 07/01/21 0447 07/02/21 0439  WBC 10.6* 10.0 10.4 10.6* 11.5*  NEUTROABS 6.6 6.5 6.6 7.0 7.5  HGB 13.7 13.0 13.2 12.8 12.8  HCT 41.0 40.5 39.5 38.9 38.4  MCV 94.9 96.2 94.7 96.3 96.2  PLT 423* 390 374 385 431   Basic Metabolic Panel: Recent Labs  Lab 06/28/21 0258 06/29/21 0434 06/30/21 0204 07/01/21 0447 07/02/21 0439  NA 137 135 135 134* 136  K 3.7 3.9 4.3 4.3 4.1  CL 108 107 107 107 106  CO2 23 23 22  20* 21*  GLUCOSE 71 128* 246* 249* 247*  BUN 13 11 10 10 10   CREATININE 1.62* 1.41* 1.29* 1.29* 1.28*  CALCIUM 8.6* 8.2* 8.4* 8.9 8.8*  MG 1.8 1.8 1.7 1.7 1.6*  PHOS 2.5 3.2 2.5 2.6 2.7   GFR: Estimated Creatinine Clearance: 71.8 mL/min (A) (by C-G formula based on SCr of 1.28 mg/dL (H)). Liver Function Tests: Recent Labs  Lab 06/28/21 0258 06/29/21 0434 06/30/21 0204 07/01/21 0447 07/02/21 0439  AST 187* 172* 215* 211* 218*  ALT 303* 309* 391* 432* 440*  ALKPHOS 34* 38 44 42 39  BILITOT 0.6 0.5 0.8 0.4 0.4  PROT 6.6 6.5 6.6 7.1 6.9  ALBUMIN 2.7* 2.7* 2.7* 2.8* 2.8*   No results for input(s): LIPASE, AMYLASE in the last 168 hours. No results for input(s): AMMONIA in the last 168 hours. Coagulation Profile: No results for input(s): INR, PROTIME in the last 168 hours. Cardiac Enzymes: Recent Labs  Lab 06/30/21 1124 07/02/21 0439  CKTOTAL 398* 310*   BNP (last 3 results) No results for input(s): PROBNP in the last 8760 hours. HbA1C: No results for input(s): HGBA1C in the last 72 hours. CBG: Recent Labs  Lab 07/01/21 0619 07/01/21 1119 07/01/21 1635 07/01/21 2232 07/02/21 0634  GLUCAP 197* 191* 246* 273* 202*   Lipid Profile: No results for input(s): CHOL, HDL, LDLCALC, TRIG, CHOLHDL, LDLDIRECT in the last 72 hours. Thyroid Function Tests: No results for  input(s): TSH, T4TOTAL, FREET4, T3FREE, THYROIDAB in the last 72 hours. Anemia Panel: No results for input(s): VITAMINB12, FOLATE, FERRITIN, TIBC, IRON, RETICCTPCT in the last 72 hours. Urine analysis: No results found for: COLORURINE, APPEARANCEUR, LABSPEC, PHURINE, GLUCOSEU, HGBUR, BILIRUBINUR, KETONESUR, PROTEINUR, UROBILINOGEN, NITRITE, LEUKOCYTESUR Sepsis Labs: @LABRCNTIP (procalcitonin:4,lacticidven:4)  ) Recent Results (from the past 240 hour(s))  Blood culture (routine x 2)     Status: None   Collection Time: 06/24/21  4:29 PM   Specimen: BLOOD  Result Value Ref Range Status   Specimen Description BLOOD LEFT ANTECUBITAL  Final   Special Requests   Final    BOTTLES DRAWN AEROBIC AND ANAEROBIC Blood Culture adequate volume   Culture   Final    NO GROWTH 5 DAYS Performed at [facility], [address], [place], [place] [address]    Report Status 06/29/2021 FINAL  Final  Resp Panel by RT-PCR (Flu A&B, Covid) Nasopharyngeal Swab     Status: None   Collection Time: 06/24/21  4:29 PM   Specimen: Nasopharyngeal Swab; Nasopharyngeal(NP) swabs  in vial transport medium  Result Value Ref Range Status   SARS Coronavirus 2 by RT PCR NEGATIVE NEGATIVE Final    Comment: (NOTE) SARS-CoV-2 target nucleic acids are NOT DETECTED.  The SARS-CoV-2 RNA is generally detectable in upper respiratory specimens during the acute phase of infection. The lowest concentration of SARS-CoV-2 viral copies this assay can detect is 138 copies/mL. A negative result does not preclude SARS-Cov-2 infection and should not be used as the sole basis for treatment or other patient management decisions. A negative result may occur with  improper specimen collection/handling, submission of specimen other than nasopharyngeal swab, presence of viral mutation(s) within the areas targeted by this assay, and inadequate number of viral copies(<138 copies/mL). A negative result must be combined with clinical  observations, patient history, and epidemiological information. The expected result is Negative.  Fact Sheet for Patients:  EntrepreneurPulse.com.au  Fact Sheet for Healthcare Providers:  IncredibleEmployment.be  This test is no t yet approved or cleared by the [place] FDA and  has been authorized for detection and/or diagnosis of SARS-CoV-2 by FDA under an Emergency Use Authorization (EUA). This EUA will remain  in effect (meaning this test can be used) for the duration of the COVID-19 declaration under Section 564(b)(1) of the Act, 21 U.S.C.section 360bbb-3(b)(1), unless the authorization is terminated  or revoked sooner.       Influenza A by PCR NEGATIVE NEGATIVE Final   Influenza B by PCR NEGATIVE NEGATIVE Final    Comment: (NOTE) The Xpert Xpress SARS-CoV-2/FLU/RSV plus assay is intended as an aid in the diagnosis of influenza from Nasopharyngeal swab specimens and should not be used as a sole basis for treatment. Nasal washings and aspirates are unacceptable for Xpert Xpress SARS-CoV-2/FLU/RSV testing.  Fact Sheet for Patients: EntrepreneurPulse.com.au  Fact Sheet for Healthcare Providers: IncredibleEmployment.be  This test is not yet approved or cleared by the [place] FDA and has been authorized for detection and/or diagnosis of SARS-CoV-2 by FDA under an Emergency Use Authorization (EUA). This EUA will remain in effect (meaning this test can be used) for the duration of the COVID-19 declaration under Section 564(b)(1) of the Act, 21 U.S.C. section 360bbb-3(b)(1), unless the authorization is terminated or revoked.  Performed at [facility], [place] [address] [place], [place] [address]   Blood culture (routine x 2)     Status: None   Collection Time: 06/24/21  4:34 PM   Specimen: BLOOD  Result Value Ref Range Status   Specimen Description BLOOD SITE NOT SPECIFIED  Final    Special Requests   Final    BOTTLES DRAWN AEROBIC AND ANAEROBIC Blood Culture results may not be optimal due to an inadequate volume of blood received in culture bottles   Culture   Final    NO GROWTH 5 DAYS Performed at [facility], [place] [address], [place], [place] [address]    Report Status 06/29/2021 FINAL  Final  Surgical PCR screen     Status: None   Collection Time: 06/26/21  4:16 PM   Specimen: Nasal Mucosa; Nasal Swab  Result Value Ref Range Status   MRSA, PCR NEGATIVE NEGATIVE Final   Staphylococcus aureus NEGATIVE NEGATIVE Final    Comment: (NOTE) The Xpert SA Assay (FDA approved for NASAL specimens in patients 78 years of age and older), is one component of a comprehensive surveillance program. It is not intended to diagnose infection nor to guide or monitor treatment. Performed at [facility], [place]  [address], [place], [place] [address]   Fungus Culture With Stain     Status: None (Preliminary result)   Collection Time: 06/27/21 11:39 AM   Specimen: Soft Tissue, Other  Result Value Ref Range Status   Fungus Stain Final report  Final    Comment: (NOTE) Performed At: [facility] [place], [place] [id number] Rush Farmer MD PR:[id number]    Fungus (Mycology) Culture PENDING  Incomplete   Fungal Source TIS PROXIMAL 5TH METATARSAL INFECTED BONE SPEC A  Final    Comment: Performed at [facility], [place] [address], [place], [place] [address]  Aerobic/Anaerobic Culture w Gram Stain (surgical/deep wound)     Status: None (Preliminary result)   Collection Time: 06/27/21 11:39 AM   Specimen: Soft Tissue, Other  Result Value Ref Range Status   Specimen Description TISSUE  Final   Special Requests PROXIMAL 5TH METATARSAL INFECTED BONE SPEC A  Final   Gram Stain   Final    RARE WBC PRESENT,BOTH PMN AND MONONUCLEAR NO ORGANISMS SEEN Performed at [facility], [address], [place], [place] [address]    Culture   Final     RARE CORYNEBACTERIUM SPECIES Standardized susceptibility testing for this organism is not available. NO ANAEROBES ISOLATED; CULTURE IN PROGRESS FOR 5 DAYS    Report Status PENDING  Incomplete  Fungus Culture Result     Status: None   Collection Time: 06/27/21 11:39 AM  Result Value Ref Range Status   Result 1 Comment  Final    Comment: (NOTE) KOH/Calcofluor preparation:  no fungus observed. Performed At: [facility] [place], [place] [id number] Rush Farmer MD AQ:[id number]       Studies: No results found.  Scheduled Meds:  amLODipine  5 mg Oral q AM   aspirin EC  81 mg Oral q AM   bictegravir-emtricitabine-tenofovir AF  1 tablet Oral QHS   Chlorhexidine Gluconate Cloth  6 each Topical Daily   dextrose  1 Tube Oral Once   docusate sodium  100 mg Oral BID   enoxaparin (LOVENOX) injection  60 mg Subcutaneous Q24H   feeding supplement  296 mL Oral Once   gabapentin  100 mg Oral QID   insulin aspart  0-15 Units Subcutaneous TID WC   insulin aspart  0-5 Units Subcutaneous QHS   insulin glargine  10 Units Subcutaneous Daily   norethindrone  5 mg Oral BID   sodium chloride flush  10-40 mL Intracatheter Q12H   traMADol  50 mg Oral Q6H    Continuous Infusions:  cefTRIAXone (ROCEPHIN)  IV 2 g (07/02/21 0108)   DAPTOmycin (CUBICIN)  IV 700 mg (07/01/21 2203)     LOS: 8 days     Cristal Deer, MD [facility]  To reach me or the doctor on call, go to: [email or web address] Password [email or web address]  07/02/2021, 9:13 AM

## 2021-07-02 NOTE — Progress Notes (Signed)
Occupational Therapy Treatment Patient Details Name: Katrina Wolfe MRN: [id number] DOB: 1966-02-17 Today's Date: 07/02/2021    History of present illness The pt is a 55 y.o. female presenting 7/8 due to poorly healing L foot wound. The pt is now s/p I&D of L foot ulcer on 7/11 due to osteomyelitis of L foot. PMH includes: ORIF of 5th metatarsal and subsequent removal of hardware due to infection, HTN, HIV, HLD, DM II, and CKD III.   OT comments  Pt continues to make progress with functional goals. Pt SPT to [facility] for toileting tasks and then to recliner for bathing and dressing. OT will continue to follow acutely to maximize level of function and safety  Follow Up Recommendations  Home health OT;Supervision - Intermittent    Equipment Recommendations  3 in 1 bedside commode;Tub/shower bench;Wheelchair (measurements OT);Wheelchair cushion (measurements OT);Other (comment) Management consultant)    Recommendations for Other Services      Precautions / Restrictions Precautions Precautions: Fall Restrictions Weight Bearing Restrictions: Yes LLE Weight Bearing: Non weight bearing       Mobility Bed Mobility Overal bed mobility: Modified Independent Bed Mobility: Supine to Sit     Supine to sit: Modified independent (Device/Increase time)     General bed mobility comments: pt able to come to sitting EOB without assist, does use bed rails to complete    Transfers Overall transfer level: Needs assistance Equipment used: Rolling walker (2 wheeled) Transfers: Sit to/from [facility] Sit to Stand: Min guard Stand pivot transfers: Min guard       General transfer comment: cues for hand placement    Balance Overall balance assessment: Needs assistance Sitting-balance support: Feet supported;No upper extremity supported Sitting balance-Leahy Scale: Good     Standing balance support: Bilateral upper extremity supported;During functional activity Standing balance-Leahy  Scale: Poor                             ADL either performed or assessed with clinical judgement   ADL Overall ADL's : Needs assistance/impaired     Grooming: Wash/dry hands;Wash/dry face;Set up;Sitting   Upper Body Bathing: Set up;Sitting   Lower Body Bathing: Min guard;Sitting/lateral leans   Upper Body Dressing : Set up;Sitting   Lower Body Dressing: Min guard;Sitting/lateral leans   Toilet Transfer: Min guard;RW;Stand-pivot;BSC;Cueing for safety   Toileting- Clothing Manipulation and Hygiene: Min guard;Sitting/lateral lean;Sit to/from stand       Functional mobility during ADLs: Min guard;Rolling walker;Cueing for sequencing;Cueing for safety General ADL Comments: Pt participated in grooming, bathing and dressing seated in recliner     Vision Baseline Vision/History: Wears glasses Wears Glasses: Reading only Patient Visual Report: No change from baseline     Perception     Praxis      Cognition Arousal/Alertness: Awake/alert Behavior During Therapy: WFL for tasks assessed/performed Overall Cognitive Status: Within Functional Limits for tasks assessed                                 General Comments: pt slightly decreased insight to safety, need for assist        Exercises     Shoulder Instructions       General Comments      Pertinent Vitals/ Pain       Pain Assessment: 0-10 Pain Score: 4  Pain Location: L foot, R knee with flexion Pain Descriptors / Indicators: Operative site  guarding;Sharp;Sore Pain Intervention(s): Monitored during session;Repositioned  Home Living                                          Prior Functioning/Environment              Frequency  Min 2X/week        Progress Toward Goals  OT Goals(current goals can now be found in the care plan section)  Progress towards OT goals: Progressing toward goals  Acute Rehab OT Goals Patient Stated Goal: go home and get therapy   Plan Discharge plan remains appropriate    Co-evaluation                 AM-PAC OT "6 Clicks" Daily Activity     Outcome Measure   Help from another person eating meals?: None Help from another person taking care of personal grooming?: None Help from another person toileting, which includes using toliet, bedpan, or urinal?: A Little Help from another person bathing (including washing, rinsing, drying)?: A Little Help from another person to put on and taking off regular upper body clothing?: None Help from another person to put on and taking off regular lower body clothing?: A Little 6 Click Score: 21    End of Session Equipment Utilized During Treatment: Gait belt;Rolling walker;Other (comment) (BSC)  OT Visit Diagnosis: Unsteadiness on feet (R26.81);Other abnormalities of gait and mobility (R26.89);Pain;Muscle weakness (generalized) (M62.81) Pain - Right/Left: Left Pain - part of body: Ankle and joints of foot (R knee)   Activity Tolerance Patient tolerated treatment well   Patient Left in chair;with call bell/phone within reach   Nurse Communication          Time: 1950-9326 OT Time Calculation (min): 26 min  Charges: OT General Charges $OT Visit: 1 Visit OT Treatments $Self Care/Home Management : 8-22 mins $Therapeutic Activity: 8-22 mins     Britt Bottom 07/02/2021, 12:11 PM

## 2021-07-02 NOTE — Progress Notes (Signed)
Physical Therapy Treatment Patient Details Name: Katrina Wolfe MRN: [id number] DOB: 1966-06-26 Today's Date: 07/02/2021    History of Present Illness The Katrina Wolfe is a 55 y.o. female presenting 7/8 due to poorly healing L foot wound. The Katrina Wolfe is now s/p I&D of L foot ulcer on 7/11 due to osteomyelitis of L foot. PMH includes: ORIF of 5th metatarsal and subsequent removal of hardware due to infection, HTN, HIV, HLD, DM II, and CKD III.    Katrina Wolfe Comments    Katrina Wolfe received in supine, agreeable to therapy session and with good participation in supine/seated/standing exercises and transfer training. Katrina Wolfe limited due to deconditioning/pain and continues to need bed height elevated to stand with +1 assist, encouraged her to perform chair push-ups when in chair/wheelchair and Katrina Wolfe performed exercises per HEP handout with good tolerance, noted to have weak B quad muscles and unable to reach TKE without AA (lacking ~15-30 deg extension with AROM on long arc quad exercise). Katrina Wolfe continues to benefit from Katrina Wolfe services to progress toward functional mobility goals. Continue to recommend HHPT, Katrina Wolfe may need to consider PTAR unless she has at least 4-person assist for wheelchair entry into apartment.   Follow Up Recommendations  Home health Katrina Wolfe;Supervision for mobility/OOB     Equipment Recommendations  Wheelchair (measurements Katrina Wolfe);Wheelchair cushion (measurements Katrina Wolfe);Rolling walker with 5" wheels    Recommendations for Other Services       Precautions / Restrictions Precautions Precautions: Fall Precaution Comments: wound vac Required Braces or Orthoses: Other Brace (has LLE short custom leg brace (not donned due to recent surgery and NWB)) Restrictions Weight Bearing Restrictions: Yes LLE Weight Bearing: Non weight bearing    Mobility  Bed Mobility Overal bed mobility: Modified Independent Bed Mobility: Supine to Sit;Sit to Supine     Supine to sit: Modified independent (Device/Increase time)     General bed  mobility comments: Katrina Wolfe able to come to sitting EOB without assist, does use bed rails to complete    Transfers Overall transfer level: Needs assistance Equipment used: Rolling walker (2 wheeled) Transfers: Sit to/from [facility] Sit to Stand: Min assist;Mod assist;From elevated surface         General transfer comment: cues for hand placement, Katrina Wolfe needs bed height significantly elevated and therapist foot behind LLE at times to prevent accidental WB, use of momentum strategy to achieve upright; unable to stand upright from lowest bed height or even 2-3" elevated.  Ambulation/Gait                 Stairs             Wheelchair Mobility    Modified Rankin (Stroke Patients Only)       Balance Overall balance assessment: Needs assistance Sitting-balance support: No upper extremity supported (1 foot supported) Sitting balance-Leahy Scale: Good     Standing balance support: Bilateral upper extremity supported;During functional activity Standing balance-Leahy Scale: Poor Standing balance comment: reliant on BUE support to maintain WB                            Cognition Arousal/Alertness: Awake/alert Behavior During Therapy: WFL for tasks assessed/performed Overall Cognitive Status: Within Functional Limits for tasks assessed                                 General Comments: cues for safety/sequencing, at times needs reminders for technique to prevent  WB on LLE      Exercises Other Exercises Other Exercises: B ankle eversion x 10 reps Other Exercises: standing RLE AROM: heel raises x10 reps Other Exercises: standing LLE AROM: hip flexion, hamstring curls, hip abduction x10 reps ea Other Exercises: supine BLE AROM: heel slides, hip ab/aDduction, SLR (on LLE with AA), LAQ (in chair posture) x10 reps ea    General Comments General comments (skin integrity, edema, etc.): HR 98 bpm and SpO2 97% with exertion       Pertinent Vitals/Pain Pain Assessment: Faces Faces Pain Scale: Hurts a little bit Pain Location: L foot, R knee with flexion Pain Descriptors / Indicators: Discomfort Pain Intervention(s): Monitored during session;Repositioned    Home Living                      Prior Function            Katrina Wolfe Goals (current goals can now be found in the care plan section) Acute Rehab Katrina Wolfe Goals Patient Stated Goal: go home and get therapy Katrina Wolfe Goal Formulation: With patient Time For Goal Achievement: 07/12/21 Potential to Achieve Goals: Good Progress towards Katrina Wolfe goals: Progressing toward goals    Frequency    Min 3X/week      Katrina Wolfe Plan Current plan remains appropriate    Co-evaluation              AM-PAC Katrina Wolfe "6 Clicks" Mobility   Outcome Measure  Help needed turning from your back to your side while in a flat bed without using bedrails?: None Help needed moving from lying on your back to sitting on the side of a flat bed without using bedrails?: None Help needed moving to and from a bed to a chair (including a wheelchair)?: A Little Help needed standing up from a chair using your arms (e.g., wheelchair or bedside chair)?: A Lot Help needed to walk in hospital room?: Total Help needed climbing 3-5 steps with a railing? : Total 6 Click Score: 15    End of Session Equipment Utilized During Treatment: Gait belt Activity Tolerance: Patient tolerated treatment well;Patient limited by fatigue Patient left: in bed;with call bell/phone within reach;with bed alarm set;Other (comment) (Katrina Wolfe had just gotten back to bed from chair prior to session; RN notified there may be something wrong with Katrina Wolfe bed (it was in trendelenburg per Katrina Wolfe without pressing buttons?)) Nurse Communication: Mobility status;Weight bearing status Katrina Wolfe Visit Diagnosis: Muscle weakness (generalized) (M62.81);Difficulty in walking, not elsewhere classified (R26.2);Pain Pain - Right/Left: Left Pain - part of body: Ankle and  joints of foot     Time: 4665-9935 Katrina Wolfe Time Calculation (min) (ACUTE ONLY): 37 min  Charges:  $Therapeutic Exercise: 23-37 mins                     Jniya Madara P., PTA Acute Rehabilitation Services Pager: [phone] Office: [place] 07/02/2021, 5:54 PM

## 2021-07-02 NOTE — Plan of Care (Signed)

## 2021-07-02 NOTE — Progress Notes (Signed)
Patient ID: Katrina Wolfe, female   DOB: August 01, 1966, 55 y.o.   MRN: [id number]   Subjective: 5 Days Post-Op Procedure(s) (LRB): IRRIGATION AND DEBRIDEMENT FOOT ULCER (Left) Patient reports pain as mild.    Objective: Vital signs in last 24 hours: Temp:  [98.2 F (36.8 C)-98.7 F (37.1 C)] 98.7 F (37.1 C) (07/15 2108) Pulse Rate:  [76-82] 76 (07/15 2108) Resp:  [16] 16 (07/15 2108) BP: (149-150)/(77-81) 150/77 (07/15 2108) SpO2:  [95 %-99 %] 95 % (07/15 2108)  Intake/Output from previous day: 07/15 0701 - 07/16 0700 In: 240 [P.O.:240] Out: -  Intake/Output this shift: No intake/output data recorded.  Recent Labs    06/30/21 0204 07/01/21 0447 07/02/21 0439  HGB 13.2 12.8 12.8   Recent Labs    07/01/21 0447 07/02/21 0439  WBC 10.6* 11.5*  RBC 4.04 3.99  HCT 38.9 38.4  PLT 385 379   Recent Labs    07/01/21 0447 07/02/21 0439  NA 134* 136  K 4.3 4.1  CL 107 106  CO2 20* 21*  BUN 10 10  CREATININE 1.29* 1.28*  GLUCOSE 249* 247*  CALCIUM 8.9 8.8*   No results for input(s): LABPT, INR in the last 72 hours.  PE:  VAC good seal No results found.  Assessment/Plan: 5 Days Post-Op Procedure(s) (LRB): IRRIGATION AND DEBRIDEMENT FOOT ULCER (Left) Plan:  on IV ABX , VAC good seal. Cultures reviewed, no change.  She states she is going home Monday.   Katrina Wolfe 07/02/2021, 7:45 AM

## 2021-07-02 NOTE — Progress Notes (Signed)
VASCULAR LAB    ABIs have been performed.  See CV proc for preliminary results.   Niklaus Mamaril, RVT 07/02/2021, 11:09 AM

## 2021-07-03 LAB — CBC WITH DIFFERENTIAL/PLATELET
Abs Immature Granulocytes: 0.04 10*3/uL (ref 0.00–0.07)
Basophils Absolute: 0.1 10*3/uL (ref 0.0–0.1)
Basophils Relative: 0 %
Eosinophils Absolute: 0.4 10*3/uL (ref 0.0–0.5)
Eosinophils Relative: 4 %
HCT: 38.2 % (ref 36.0–46.0)
Hemoglobin: 12.8 g/dL (ref 12.0–15.0)
Immature Granulocytes: 0 %
Lymphocytes Relative: 19 %
Lymphs Abs: 2.2 10*3/uL (ref 0.7–4.0)
MCH: 32.2 pg (ref 26.0–34.0)
MCHC: 33.5 g/dL (ref 30.0–36.0)
MCV: 96 fL (ref 80.0–100.0)
Monocytes Absolute: 1 10*3/uL (ref 0.1–1.0)
Monocytes Relative: 9 %
Neutro Abs: 7.8 10*3/uL — ABNORMAL HIGH (ref 1.7–7.7)
Neutrophils Relative %: 68 %
Platelets: 344 10*3/uL (ref 150–400)
RBC: 3.98 MIL/uL (ref 3.87–5.11)
RDW: 13.3 % (ref 11.5–15.5)
WBC: 11.5 10*3/uL — ABNORMAL HIGH (ref 4.0–10.5)
nRBC: 0 % (ref 0.0–0.2)

## 2021-07-03 LAB — AEROBIC/ANAEROBIC CULTURE W GRAM STAIN (SURGICAL/DEEP WOUND)

## 2021-07-03 LAB — GLUCOSE, CAPILLARY
Glucose-Capillary: 267 mg/dL — ABNORMAL HIGH (ref 70–99)
Glucose-Capillary: 220 mg/dL — ABNORMAL HIGH (ref 70–99)
Glucose-Capillary: 265 mg/dL — ABNORMAL HIGH (ref 70–99)
Glucose-Capillary: 267 mg/dL — ABNORMAL HIGH (ref 70–99)

## 2021-07-03 LAB — PHOSPHORUS: Phosphorus: 2.9 mg/dL (ref 2.5–4.6)

## 2021-07-03 LAB — MAGNESIUM: Magnesium: 1.5 mg/dL — ABNORMAL LOW (ref 1.7–2.4)

## 2021-07-03 MED ORDER — INSULIN GLARGINE 100 UNIT/ML ~~LOC~~ SOLN
10.0000 [IU] | Freq: Two times a day (BID) | SUBCUTANEOUS | Status: DC
  Administered 2021-07-03 – 2021-07-04 (×2): 10 [IU] via SUBCUTANEOUS
  Filled 2021-07-03 (×3): qty 0.1

## 2021-07-03 NOTE — Progress Notes (Addendum)
PROGRESS NOTE  Katrina Wolfe LHT:[id number] DOB: 1966-07-16 DOA: 06/24/2021 PCP: Wenda Low, MD  HPI/Recap of past 24 hours: 55 y.o. 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  She is status post ORIF of the fifth metatarsal with subsequent removal of hardware secondary to infection.  July 02, 2021: Patient seen and examined at bedside Patient denies any complaints  July 03, 2021: Patient seen and examined at bedside. Denies any new complaint no fever.    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  Osteomyelitis of the left foot Continue daptomycin and Rocephin. Orthopedic following Patient is likely going home tomorrow on IV antibiotics  2.  Transaminitis. Statins on hold  3.  HIV Infectious disease consulted patient is on BIKTARVY and daptomycin  4.  Diabetes mellitus glucose is elevated continue Lantus which has just been increased Continue sliding scale Farxiga and glimepiride on hold  5.  Acute on chronic kidney injury with chronic kidney disease stage IIIb initially creatinine went up to 2.2 now back to baseline Lisinopril still on hold  Code Status: Full  Severity of Illness: The appropriate patient status for this patient is INPATIENT. Inpatient status is judged to be reasonable and necessary in  order to provide the required intensity of service to ensure the patient's safety. The patient's presenting symptoms, physical exam findings, and initial radiographic and laboratory data in the context of their chronic comorbidities is felt to place them at high risk for further clinical deterioration. Furthermore, it is not anticipated that the patient will be medically stable for discharge from the hospital within 2 midnights of admission. The following factors support the patient status of inpatient.  Stable for discharge  * I certify that at the point of admission it is my clinical judgment that the patient will require inpatient hospital care spanning beyond 2 midnights from the point of admission due to high intensity of service, high risk for further deterioration and high frequency of surveillance required.*   Family Communication: Patient  Disposition Plan: Home   Consultants: Orthopedic ID  Procedures: IRRIGATION AND DEBRIDEMENT FOOT ULCER (Left)  Antimicrobials: Ceftriaxone  DVT prophylaxis: Lovenox   Objective: Vitals:   07/02/21 2025 07/03/21 0729 07/03/21 1503 07/03/21 2101  BP: (!) 143/81 131/72 138/80 (!) 148/76  Pulse: 84 84 76 78  Resp: 16  16 18   Temp: 98 F (36.7 C) 98.2 F (36.8 C) 98.5 F (36.9 C) 98.4 F (36.9 C)  TempSrc: Oral Oral Oral Oral  SpO2: 91% 95% 97% 98%  Weight:      Height:        Intake/Output Summary (Last 24 hours) at 07/03/2021 2108 Last data filed at 07/03/2021 0826 Gross per 24 hour  Intake --  Output 400 ml  Net -400 ml    Filed Weights   06/24/21 1929  Weight: 125.2 kg   Body mass index is  40.17 kg/m.  Exam:  General: 55 y.o. year-old female well developed well nourished in no acute distress.  Alert and oriented x3. HEENT: Left eye aphakia Cardiovascular: Regular rate and rhythm with no rubs or gallops.  No thyromegaly or JVD noted.   Respiratory: Clear to auscultation with no wheezes or rales. Good inspiratory  effort. Abdomen: Soft nontender nondistended with normal bowel sounds x4 quadrants. Musculoskeletal: No lower extremity edema. 2/4 pulses in all 4 extremities. Skin: ulcerative lesions noted with wound VAC  Psychiatry: Mood is appropriate for condition and setting    Data Reviewed: CBC: Recent Labs  Lab 06/29/21 0434 06/30/21 0204 07/01/21 0447 07/02/21 0439 07/03/21 0334  WBC 10.0 10.4 10.6* 11.5* 11.5*  NEUTROABS 6.5 6.6 7.0 7.5 7.8*  HGB 13.0 13.2 12.8 12.8 12.8  HCT 40.5 39.5 38.9 38.4 38.2  MCV 96.2 94.7 96.3 96.2 96.0  PLT 390 374 385 379 485    Basic Metabolic Panel: Recent Labs  Lab 06/28/21 0258 06/29/21 0434 06/30/21 0204 07/01/21 0447 07/02/21 0439 07/03/21 0334  NA 137 135 135 134* 136  --   K 3.7 3.9 4.3 4.3 4.1  --   CL 108 107 107 107 106  --   CO2 23 23 22  20* 21*  --   GLUCOSE 71 128* 246* 249* 247*  --   BUN 13 11 10 10 10   --   CREATININE 1.62* 1.41* 1.29* 1.29* 1.28*  --   CALCIUM 8.6* 8.2* 8.4* 8.9 8.8*  --   MG 1.8 1.8 1.7 1.7 1.6* 1.5*  PHOS 2.5 3.2 2.5 2.6 2.7 2.9    GFR: Estimated Creatinine Clearance: 71.8 mL/min (A) (by C-G formula based on SCr of 1.28 mg/dL (H)). Liver Function Tests: Recent Labs  Lab 06/28/21 0258 06/29/21 0434 06/30/21 0204 07/01/21 0447 07/02/21 0439  AST 187* 172* 215* 211* 218*  ALT 303* 309* 391* 432* 440*  ALKPHOS 34* 38 44 42 39  BILITOT 0.6 0.5 0.8 0.4 0.4  PROT 6.6 6.5 6.6 7.1 6.9  ALBUMIN 2.7* 2.7* 2.7* 2.8* 2.8*    No results for input(s): LIPASE, AMYLASE in the last 168 hours. No results for input(s): AMMONIA in the last 168 hours. Coagulation Profile: No results for input(s): INR, PROTIME in the last 168 hours. Cardiac Enzymes: Recent Labs  Lab 06/30/21 1124 07/02/21 0439  CKTOTAL 398* 310*    BNP (last 3 results) No results for input(s): PROBNP in the last 8760 hours. HbA1C: No results for input(s): HGBA1C in the last 72 hours. CBG: Recent Labs  Lab 07/02/21 2256  07/03/21 0628 07/03/21 1110 07/03/21 1604 07/03/21 2106  GLUCAP 318* 267* 220* 265* 267*    Lipid Profile: No results for input(s): CHOL, HDL, LDLCALC, TRIG, CHOLHDL, LDLDIRECT in the last 72 hours. Thyroid Function Tests: No results for input(s): TSH, T4TOTAL, FREET4, T3FREE, THYROIDAB in the last 72 hours. Anemia Panel: No results for input(s): VITAMINB12, FOLATE, FERRITIN, TIBC, IRON, RETICCTPCT in the last 72 hours. Urine analysis: No results found for: COLORURINE, APPEARANCEUR, LABSPEC, PHURINE, GLUCOSEU, HGBUR, BILIRUBINUR, KETONESUR, PROTEINUR, UROBILINOGEN, NITRITE, LEUKOCYTESUR Sepsis Labs: @LABRCNTIP (procalcitonin:4,lacticidven:4)  ) Recent Results (from the past 240 hour(s))  Blood culture (routine x 2)     Status: None   Collection Time: 06/24/21  4:29 PM   Specimen: BLOOD  Result Value Ref Range Status   Specimen Description BLOOD LEFT ANTECUBITAL  Final   Special Requests   Final    BOTTLES DRAWN AEROBIC AND ANAEROBIC Blood Culture adequate volume  Culture   Final    NO GROWTH 5 DAYS Performed at [facility], [place] [address], [place], [place] [address]    Report Status 06/29/2021 FINAL  Final  Resp Panel by RT-PCR (Flu A&B, Covid) Nasopharyngeal Swab     Status: None   Collection Time: 06/24/21  4:29 PM   Specimen: Nasopharyngeal Swab; Nasopharyngeal(NP) swabs in vial transport medium  Result Value Ref Range Status   SARS Coronavirus 2 by RT PCR NEGATIVE NEGATIVE Final    Comment: (NOTE) SARS-CoV-2 target nucleic acids are NOT DETECTED.  The SARS-CoV-2 RNA is generally detectable in upper respiratory specimens during the acute phase of infection. The lowest concentration of SARS-CoV-2 viral copies this assay can detect is 138 copies/mL. A negative result does not preclude SARS-Cov-2 infection and should not be used as the sole basis for treatment or other patient management decisions. A negative result may occur with  improper specimen  collection/handling, submission of specimen other than nasopharyngeal swab, presence of viral mutation(s) within the areas targeted by this assay, and inadequate number of viral copies(<138 copies/mL). A negative result must be combined with clinical observations, patient history, and epidemiological information. The expected result is Negative.  Fact Sheet for Patients:  EntrepreneurPulse.com.au  Fact Sheet for Healthcare Providers:  IncredibleEmployment.be  This test is no t yet approved or cleared by the [place] FDA and  has been authorized for detection and/or diagnosis of SARS-CoV-2 by FDA under an Emergency Use Authorization (EUA). This EUA will remain  in effect (meaning this test can be used) for the duration of the COVID-19 declaration under Section 564(b)(1) of the Act, 21 U.S.C.section 360bbb-3(b)(1), unless the authorization is terminated  or revoked sooner.       Influenza A by PCR NEGATIVE NEGATIVE Final   Influenza B by PCR NEGATIVE NEGATIVE Final    Comment: (NOTE) The Xpert Xpress SARS-CoV-2/FLU/RSV plus assay is intended as an aid in the diagnosis of influenza from Nasopharyngeal swab specimens and should not be used as a sole basis for treatment. Nasal washings and aspirates are unacceptable for Xpert Xpress SARS-CoV-2/FLU/RSV testing.  Fact Sheet for Patients: EntrepreneurPulse.com.au  Fact Sheet for Healthcare Providers: IncredibleEmployment.be  This test is not yet approved or cleared by the [place] FDA and has been authorized for detection and/or diagnosis of SARS-CoV-2 by FDA under an Emergency Use Authorization (EUA). This EUA will remain in effect (meaning this test can be used) for the duration of the COVID-19 declaration under Section 564(b)(1) of the Act, 21 U.S.C. section 360bbb-3(b)(1), unless the authorization is terminated or revoked.  Performed at [facility], [place] [address], [place], [place] [address]   Blood culture (routine x 2)     Status: None   Collection Time: 06/24/21  4:34 PM   Specimen: BLOOD  Result Value Ref Range Status   Specimen Description BLOOD SITE NOT SPECIFIED  Final   Special Requests   Final    BOTTLES DRAWN AEROBIC AND ANAEROBIC Blood Culture results may not be optimal due to an inadequate volume of blood received in culture bottles   Culture   Final    NO GROWTH 5 DAYS Performed at [facility], [place] [address] [place],  [address]    Report Status 06/29/2021 FINAL  Final  Surgical PCR screen     Status: None   Collection Time: 06/26/21  4:16 PM   Specimen: Nasal Mucosa; Nasal Swab  Result Value Ref Range Status  MRSA, PCR NEGATIVE NEGATIVE Final   Staphylococcus aureus NEGATIVE NEGATIVE Final    Comment: (NOTE) The Xpert SA Assay (FDA approved for NASAL specimens in patients 4 years of age and older), is one component of a comprehensive surveillance program. It is not intended to diagnose infection nor to guide or monitor treatment. Performed at [facility], [place] [address], [place], [place] [address]   Fungus Culture With Stain     Status: None (Preliminary result)   Collection Time: 06/27/21 11:39 AM   Specimen: Soft Tissue, Other  Result Value Ref Range Status   Fungus Stain Final report  Final    Comment: (NOTE) Performed At: [facility] [place], [place] [id number] Rush Farmer MD [id number]    Fungus (Mycology) Culture PENDING  Incomplete   Fungal Source TIS PROXIMAL 5TH METATARSAL INFECTED BONE SPEC A  Final    Comment: Performed at [facility], [place] [address], [place], [place] [address]  Aerobic/Anaerobic Culture w Gram Stain (surgical/deep wound)     Status: None   Collection Time: 06/27/21 11:39 AM   Specimen: Soft Tissue, Other  Result Value Ref Range Status   Specimen Description TISSUE  Final   Special Requests PROXIMAL  5TH METATARSAL INFECTED BONE SPEC A  Final   Gram Stain   Final    RARE WBC PRESENT,BOTH PMN AND MONONUCLEAR NO ORGANISMS SEEN    Culture   Final    RARE CORYNEBACTERIUM SPECIES Standardized susceptibility testing for this organism is not available. NO ANAEROBES ISOLATED Performed at [facility], [place] [address] [place], [place] [address]    Report Status 07/03/2021 FINAL  Final  Fungus Culture Result     Status: None   Collection Time: 06/27/21 11:39 AM  Result Value Ref Range Status   Result 1 Comment  Final    Comment: (NOTE) KOH/Calcofluor preparation:  no fungus observed. Performed At: [facility] [place], [place] [id number] Rush Farmer MD DD:[id number]        Studies: No results found.  Scheduled Meds:  amLODipine  5 mg Oral q AM   aspirin EC  81 mg Oral q AM   bictegravir-emtricitabine-tenofovir AF  1 tablet Oral QHS   Chlorhexidine Gluconate Cloth  6 each Topical Daily   dextrose  1 Tube Oral Once   docusate sodium  100 mg Oral BID   enoxaparin (LOVENOX) injection  60 mg Subcutaneous Q24H   feeding supplement  296 mL Oral Once   gabapentin  100 mg Oral QID   insulin aspart  0-15 Units Subcutaneous TID WC   insulin aspart  0-5 Units Subcutaneous QHS   insulin glargine  10 Units Subcutaneous BID   norethindrone  5 mg Oral BID   sodium chloride flush  10-40 mL Intracatheter Q12H   traMADol  50 mg Oral Q6H    Continuous Infusions:  cefTRIAXone (ROCEPHIN)  IV 2 g (07/03/21 0043)   DAPTOmycin (CUBICIN)  IV 700 mg (07/03/21 2051)     LOS: 9 days     Cristal Deer, MD [facility]  To reach me or the doctor on call, go to: [email or web address] Password [facility]  07/03/2021, 9:08 PM

## 2021-07-03 NOTE — Plan of Care (Signed)

## 2021-07-03 NOTE — Plan of Care (Signed)
  Problem: Education: Goal: Knowledge of General Education information will improve Description: Including pain rating scale, medication(s)/side effects and non-pharmacologic comfort measures Outcome: Progressing   Problem: Health Behavior/Discharge Planning: Goal: Ability to manage health-related needs will improve Outcome: Progressing   Problem: Clinical Measurements: Goal: Ability to maintain clinical measurements within normal limits will improve Outcome: Progressing   Problem: Nutrition: Goal: Adequate nutrition will be maintained Outcome: Progressing   Problem: Pain Managment: Goal: General experience of comfort will improve Outcome: Progressing   Problem: Safety: Goal: Ability to remain free from injury will improve Outcome: Progressing   

## 2021-07-03 NOTE — Progress Notes (Signed)
Patient ID: Katrina Wolfe, female   DOB: 22-Oct-1966, 55 y.o.   MRN: [id number] No Pain problems.  Home Monday. VAC good seal.

## 2021-07-04 ENCOUNTER — Encounter: Payer: Self-pay | Admitting: Physician Assistant

## 2021-07-04 LAB — CBC WITH DIFFERENTIAL/PLATELET
Abs Immature Granulocytes: 0.03 10*3/uL (ref 0.00–0.07)
Basophils Absolute: 0.1 10*3/uL (ref 0.0–0.1)
Basophils Relative: 0 %
Eosinophils Absolute: 0.4 10*3/uL (ref 0.0–0.5)
Eosinophils Relative: 4 %
HCT: 38.7 % (ref 36.0–46.0)
Hemoglobin: 12.8 g/dL (ref 12.0–15.0)
Immature Granulocytes: 0 %
Lymphocytes Relative: 25 %
Lymphs Abs: 2.8 10*3/uL (ref 0.7–4.0)
MCH: 31.4 pg (ref 26.0–34.0)
MCHC: 33.1 g/dL (ref 30.0–36.0)
MCV: 94.9 fL (ref 80.0–100.0)
Monocytes Absolute: 0.9 10*3/uL (ref 0.1–1.0)
Monocytes Relative: 8 %
Neutro Abs: 7 10*3/uL (ref 1.7–7.7)
Neutrophils Relative %: 63 %
Platelets: 353 10*3/uL (ref 150–400)
RBC: 4.08 MIL/uL (ref 3.87–5.11)
RDW: 13.3 % (ref 11.5–15.5)
WBC: 11.2 10*3/uL — ABNORMAL HIGH (ref 4.0–10.5)
nRBC: 0 % (ref 0.0–0.2)

## 2021-07-04 LAB — MAGNESIUM: Magnesium: 1.6 mg/dL — ABNORMAL LOW (ref 1.7–2.4)

## 2021-07-04 LAB — GLUCOSE, CAPILLARY
Glucose-Capillary: 199 mg/dL — ABNORMAL HIGH (ref 70–99)
Glucose-Capillary: 253 mg/dL — ABNORMAL HIGH (ref 70–99)

## 2021-07-04 LAB — PHOSPHORUS: Phosphorus: 3.1 mg/dL (ref 2.5–4.6)

## 2021-07-04 MED ORDER — HYDROCODONE-ACETAMINOPHEN 5-325 MG PO TABS: 1.0000 | ORAL_TABLET | ORAL | 0 refills | Status: DC | PRN

## 2021-07-04 MED ORDER — DAPTOMYCIN IV (FOR PTA / DISCHARGE USE ONLY): 700.0000 mg | INTRAVENOUS | 0 refills | Status: AC

## 2021-07-04 MED ORDER — HEPARIN SOD (PORK) LOCK FLUSH 100 UNIT/ML IV SOLN
250.0000 [IU] | INTRAVENOUS | Status: AC | PRN
  Administered 2021-07-04: 250 [IU]
  Filled 2021-07-04: qty 2.5

## 2021-07-04 MED ORDER — CEFTRIAXONE IV (FOR PTA / DISCHARGE USE ONLY): 2.0000 g | INTRAVENOUS | 0 refills | Status: AC

## 2021-07-04 NOTE — Progress Notes (Signed)
PT Cancellation Note  Patient Details Name: Katrina Wolfe MRN: [id number] DOB: Nov 10, 1966   Cancelled Treatment:    Reason Eval/Treat Not Completed: Other (comment). Declined PT since she is leaving today. Talked with patient about wheelchair, but is not getting another because it's out of pocket.  Talked with case manager, who is asking pt if she wants to replace her armrests on chair out of pocket.  Still recommending rehab for pt.   Ramond Dial 07/04/2021, 3:54 PM  Mee Hives, PT MS Acute Rehab Dept. Number: [place] and [place]

## 2021-07-04 NOTE — Discharge Summary (Signed)
Physician Discharge Summary  Katrina Wolfe RXV:[id number] DOB: 1966-04-13 DOA: 06/24/2021  PCP: Wenda Low, MD  Admit date: 06/24/2021 Discharge date: 07/04/2021  Admitted From: home Disposition:  home  Recommendations for Outpatient Follow-up:  Follow up with PCP in 1-2 weeks  Home Health: none Equipment/Devices: none  Discharge Condition: stable CODE STATUS: Full code Diet recommendation: regular  HPI: Per admitting MD, Katrina Wolfe is a 55 y.o. 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 She has a history of an ORIF of the fifth metatarsal with subsequent removal of hardware secondary to infection. She states she has been feeling bad for the past week with nausea, chills, headaches and more drainage from her ulcer that has a foul odor. She has also had more pain in theThe odor is new and she states she knows something is wrong when it smells this way. She denies any fevers, cough, shortness of breath, chest pain, diarrhea or vomiting. She has some discomfort in her left lower abdomen, but no pain. She has been compliant with her medication.   Hospital Course / Discharge diagnoses: Principal problem Left foot osteomyelitis -orthopedic surgery as well as infectious disease were consulted and followed patient while hospitalized.  She was taken to the OR on 7/11 and is status post I&D of left foot ulcer.  Culture sent showed rare Corynebacterium species.  Infectious disease recommending treatment with daptomycin as well as ceftriaxone through 08/08/2021.  She had a PICC line placed while hospitalized  Active problems DM2-resume home medications Acute kidney injury on chronic kidney disease stage IIIb-creatinine has now returned to baseline Elevated LFTs-right upper quadrant ultrasound without any acute findings.  Overall  stable.  Hold statin on discharge, repeat LFTs as an outpatient  Sepsis ruled out   Discharge Instructions  Discharge Instructions     [facility] pharmacist to adjust dose for Vancomycin, Aminoglycosides and other anti-infective therapies as requested by physician.   Complete by: As directed    [facility] to provide Cath Flo 2mg    Complete by: As directed    Administer for PICC line occlusion and as ordered by physician for other access device issues.   Anaphylaxis Kit: Provided to treat any anaphylactic reaction to the medication being provided to the patient if First Dose or when requested by physician   Complete by: As directed    Epinephrine 1mg /ml vial / amp: Administer 0.3mg  (0.28ml) subcutaneously once for moderate to severe anaphylaxis, nurse to call physician and pharmacy when reaction occurs and call 911 if needed for immediate care   Diphenhydramine 50mg /ml IV vial: Administer 25-50mg  IV/IM PRN for first dose reaction, rash, itching, mild reaction, nurse to call physician and pharmacy when reaction occurs   Sodium Chloride 0.9% NS 52ml IV: Administer if needed for hypovolemic blood pressure drop or as ordered by physician after call to physician with anaphylactic reaction   Change dressing on IV access line weekly and PRN   Complete by: As directed    Flush IV access with Sodium Chloride 0.9% and Heparin 10 units/ml or 100 units/ml   Complete by: As directed    Home infusion instructions - [facility]   Complete by: As directed    Instructions: Flush IV access with Sodium Chloride 0.9% and Heparin 10units/ml or 100units/ml   Change dressing on IV access  line: Weekly and PRN   Instructions Cath Flo $Remove'2mg'CgVUZTE$ : Administer for PICC Line occlusion and as ordered by physician for other access device   [facility] pharmacist to adjust dose for: Vancomycin, Aminoglycosides and other anti-infective therapies as requested by physician   Method of  administration may be changed at the discretion of home infusion pharmacist based upon assessment of the patient and/or caregiver's ability to self-administer the medication ordered   Complete by: As directed       Allergies as of 07/04/2021       Reactions   Shellfish Allergy Anaphylaxis, Swelling   Bactrim [sulfamethoxazole-trimethoprim] Hives        Medication List     STOP taking these medications    atorvastatin 10 MG tablet Commonly known as: LIPITOR       TAKE these medications    albuterol 108 (90 Base) MCG/ACT inhaler Commonly known as: VENTOLIN HFA Inhale 2 puffs into the lungs every 6 (six) hours as needed for wheezing or shortness of breath.   amLODipine 5 MG tablet Commonly known as: NORVASC Take 5 mg by mouth in the morning.   aspirin EC 81 MG tablet Take 81 mg by mouth in the morning. Swallow whole.   Biktarvy 50-200-25 MG Tabs tablet Generic drug: bictegravir-emtricitabine-tenofovir AF Take 1 tablet by mouth at bedtime.   cefTRIAXone  IVPB Commonly known as: ROCEPHIN Inject 2 g into the vein daily. Indication:  osteomyelitis  First Dose: No Last Day of Therapy:  08/08/2021 Labs - Once weekly:  CBC/D and BMP, Labs - Every other week:  ESR and CRP Method of administration: IV Push Method of administration may be changed at the discretion of home infusion pharmacist based upon assessment of the patient and/or caregiver's ability to self-administer the medication ordered.   daptomycin  IVPB Commonly known as: CUBICIN Inject 700 mg into the vein daily. Indication:  osteomyelitis  First Dose: No Last Day of Therapy:  08/08/2021 Labs - Twice weekly:  CBC/D, BMP, and CPK Labs - Every other week:  ESR and CRP Method of administration: IV Push Method of administration may be changed at the discretion of home infusion pharmacist based upon assessment of the patient and/or caregiver's ability to self-administer the medication ordered.   diphenhydrAMINE  25 MG tablet Commonly known as: BENADRYL Take 25 mg by mouth every 6 (six) hours as needed for allergies (or allergic reactions).   Farxiga 10 MG Tabs tablet Generic drug: dapagliflozin propanediol Take 10 mg by mouth in the morning.   gabapentin 100 MG capsule Commonly known as: NEURONTIN Take 100 mg by mouth 4 (four) times daily.   glimepiride 2 MG tablet Commonly known as: AMARYL Take 2 mg by mouth 2 (two) times daily.   HYDROcodone-acetaminophen 5-325 MG tablet Commonly known as: NORCO/VICODIN Take 1-2 tablets by mouth every 4 (four) hours as needed for moderate pain (pain score 4-6).   Insulin Lispro Prot & Lispro (75-25) 100 UNIT/ML Kwikpen Commonly known as: HUMALOG 75/25 MIX Inject 60 Units into the skin in the morning and at bedtime.   lisinopril-hydrochlorothiazide 20-25 MG tablet Commonly known as: ZESTORETIC Take 1 tablet by mouth daily.   methocarbamol 500 MG tablet Commonly known as: ROBAXIN Take 500 mg by mouth every 8 (eight) hours as needed for muscle spasms.   naproxen sodium 220 MG tablet Commonly known as: ALEVE Take 220-440 mg by mouth 2 (two) times daily as needed (for pain or headaches).   norethindrone 5 MG tablet Commonly  known as: AYGESTIN Take 5 mg by mouth in the morning and at bedtime.   traMADol 50 MG tablet Commonly known as: ULTRAM Take 50 mg by mouth every 4 (four) hours as needed (for pain).   Trulicity 3 GQ/9.1QX Sopn Generic drug: Dulaglutide Inject 3 mg into the skin every Friday.               Durable Medical Equipment  (From admission, onward)           Start     Ordered   07/04/21 1159  For home use only DME lightweight manual wheelchair with seat cushion  Once       Comments: Patient suffers from ORIF of 5th metatarsal  which impairs their ability to perform daily activities like walking , grooming in the home.  A rolling walker will not resolve  issue with performing activities of daily living. A wheelchair  will allow patient to safely perform daily activities. Patient is not able to propel themselves in the home using a standard weight wheelchair due to general weakness. Patient can self propel in the lightweight wheelchair. Length of need 12 months . Accessories: elevating leg rests (ELRs), wheel locks, extensions and anti-tippers.   07/04/21 1203              Discharge Care Instructions  (From admission, onward)           Start     Ordered   07/04/21 0000  Change dressing on IV access line weekly and PRN  (Home infusion instructions - [facility] )        07/04/21 0746            Follow-up Information     Newt Minion, MD Follow up in 3 week(s).   Specialty: Orthopedic Surgery Contact information: [place] [address] [phone]         Mignon Pine, DO Follow up on 07/06/2021.   Specialties: Infectious Diseases, Internal Medicine Why: 10:30 am TELEPHONE appointment set up Contact information: [address] [place] [address] [phone]                 Consultations: Orthopedic surgery  ID  Procedures/Studies:  MR FOOT LEFT WO CONTRAST  Result Date: 06/26/2021 CLINICAL DATA:  Diabetic patient with a chronic skin ulceration at the base of the fifth metatarsal. History of prior fifth metatarsal fracture with subsequent infection fracture fixation hardware. EXAM: MRI OF THE LEFT FOOT WITHOUT CONTRAST TECHNIQUE: Multiplanar, multisequence MR imaging of the left foot was performed. No intravenous contrast was administered. COMPARISON:  Plain films left foot 03/01/2019 and 06/24/2021. FINDINGS: Bones/Joint/Cartilage Remote fracture of the base of the fifth metatarsal is again seen. Subjacent to a skin wound, there is an ununited fracture fragment measuring 2.1 cm long by 1.5 cm transverse by 1.5 cm craniocaudal. There is marrow edema within this fragment. Marrow edema is also seen in the proximal 1.5 cm  of the fifth metatarsal shaft. Cortical thickening in the fifth metatarsal shaft as seen on prior plain films is noted. Ligaments Negative. Muscles and Tendons No intramuscular fluid collection.  No tendon tear. Soft tissues Skin ulceration is again noted.  No underlying abscess. 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 Marrow edema in an ununited fracture fragment at the base of the fifth metatarsal and adjacent 1.5 cm of the fifth metatarsal shaft is worrisome for osteomyelitis. There is an overlying skin  wound but no abscess. Electronically Signed   By: Inge Rise M.D.   On: 06/26/2021 09:11   DG Foot Complete Left  Result Date: 06/24/2021 CLINICAL DATA:  Open wound to the lateral aspect of the left foot EXAM: LEFT FOOT - COMPLETE 3+ VIEW COMPARISON:  None. FINDINGS: Lateral ulcer with underlying calcification. A donor site for the calcification is not seen and may be heterotopic bone. Sclerosis and mature periosteal reaction affecting the proximal fifth metatarsal. Lateral arch collapse based on the lateral view, although nonweightbearing. Generalized osteopenia. IMPRESSION: Lateral soft tissue ulcer with immediately subjacent bone or heterotopic ossification. There is also abnormal sclerosis of the fifth metatarsal suggesting chronic osteomyelitis. Electronically Signed   By: Monte Fantasia M.D.   On: 06/24/2021 11:48   VAS [place] ABI WITH/WO TBI  Result Date: 07/02/2021  LOWER EXTREMITY DOPPLER STUDY Patient Name:  ELENOR WILDES  Date of Exam:   07/02/2021 Medical Rec #: [id number]       Accession #:    [id number] Date of Birth: 03-24-1966       Patient Gender: F Patient Age:   30Y Exam Location:  [facility] Procedure:      VAS [place] ABI WITH/WO TBI Referring Phys: [id number] Mignon Pine --------------------------------------------------------------------------------  Indications: Ulceration, and Osteo of left lateral foot. High Risk Factors: Hypertension,  hyperlipidemia, Diabetes. Other Factors: CKD III, HIV.  Limitations: Today's exam was limited due to PICC line right arm. Comparison Study: No prior study Performing Technologist: Sharion Dove RVS  Examination Guidelines: A complete evaluation includes at minimum, Doppler waveform signals and systolic blood pressure reading at the level of bilateral brachial, anterior tibial, and posterior tibial arteries, when vessel segments are accessible. Bilateral testing is considered an integral part of a complete examination. Photoelectric Plethysmograph (PPG) waveforms and toe systolic pressure readings are included as required and additional duplex testing as needed. Limited examinations for reoccurring indications may be performed as noted.  ABI Findings: +---------+------------------+-----+-----------+---------------+ Right    Rt Pressure (mmHg)IndexWaveform   Comment         +---------+------------------+-----+-----------+---------------+ Brachial                        multiphasicrestricted/PICC +---------+------------------+-----+-----------+---------------+ PTA      178               1.16 multiphasic                +---------+------------------+-----+-----------+---------------+ DP       165               1.08 multiphasic                +---------+------------------+-----+-----------+---------------+ Great Toe160               1.05                            +---------+------------------+-----+-----------+---------------+ +---------+------------------+-----+-----------+-------+ Left     Lt Pressure (mmHg)IndexWaveform   Comment +---------+------------------+-----+-----------+-------+ Brachial 153                    multiphasic        +---------+------------------+-----+-----------+-------+ PTA      171               1.12 multiphasic        +---------+------------------+-----+-----------+-------+ DP       171  1.12 multiphasic         +---------+------------------+-----+-----------+-------+ Great Toe144               0.94                    +---------+------------------+-----+-----------+-------+ +-------+-----------+-----------+------------+------------+ ABI/TBIToday's ABIToday's TBIPrevious ABIPrevious TBI +-------+-----------+-----------+------------+------------+ Right  1.16       1.05                                +-------+-----------+-----------+------------+------------+ Left   1.12       0.94                                +-------+-----------+-----------+------------+------------+  Summary: Right: Resting right ankle-brachial index is within normal range. No evidence of significant right lower extremity arterial disease. The right toe-brachial index is normal. Left: Resting left ankle-brachial index is within normal range. No evidence of significant left lower extremity arterial disease. The left toe-brachial index is normal.  *See table(s) above for measurements and observations.  Electronically signed by Monica Martinez MD on 07/02/2021 at 2:05:10 PM.    Final    [place] EKG SITE RITE  Result Date: 06/29/2021 If Site Rite image not attached, placement could not be confirmed due to current cardiac rhythm.  US Abdomen Limited RUQ (LIVER/GB)  Result Date: 06/30/2021 CLINICAL DATA:  Elevated LFTs EXAM: ULTRASOUND ABDOMEN LIMITED RIGHT UPPER QUADRANT COMPARISON:  None. FINDINGS: Gallbladder: Decompressed but filled with gallstones. No pericholecystic fluid or sonographic Murphy's sign is elicited. Common bile duct: Diameter: 5.3 mm Liver: No focal lesion identified. Within normal limits in parenchymal echogenicity. Portal vein is patent on color Doppler imaging with normal direction of blood flow towards the liver. Other: None. IMPRESSION: Cholelithiasis within a decompressed gallbladder. No complicating factors are noted. Electronically Signed   By: Inez Catalina M.D.   On: 06/30/2021 23:01     Subjective: -  no chest pain, shortness of breath, no abdominal pain, nausea or vomiting.   Discharge Exam: BP 134/80 (BP Location: Left Arm)   Pulse 78   Temp 99.1 F (37.3 C) (Oral)   Resp 16   Ht 5' 9.5" (1.765 m)   Wt 125.2 kg   SpO2 98%   BMI 40.17 kg/m   General: Pt is alert, awake, not in acute distress Cardiovascular: RRR, S1/S2 +, no rubs, no gallops Respiratory: CTA bilaterally, no wheezing, no rhonchi Abdominal: Soft, NT, ND, bowel sounds + Extremities: no edema, no cyanosis  The results of significant diagnostics from this hospitalization (including imaging, microbiology, ancillary and laboratory) are listed below for reference.     Microbiology: Recent Results (from the past 240 hour(s))  Blood culture (routine x 2)     Status: None   Collection Time: 06/24/21  4:29 PM   Specimen: BLOOD  Result Value Ref Range Status   Specimen Description BLOOD LEFT ANTECUBITAL  Final   Special Requests   Final    BOTTLES DRAWN AEROBIC AND ANAEROBIC Blood Culture adequate volume   Culture   Final    NO GROWTH 5 DAYS Performed at [facility], [address] [place], [place] [address]    Report Status 06/29/2021 FINAL  Final  Resp Panel by RT-PCR (Flu A&B, Covid) Nasopharyngeal Swab     Status: None   Collection Time: 06/24/21  4:29 PM   Specimen: Nasopharyngeal  Swab; Nasopharyngeal(NP) swabs in vial transport medium  Result Value Ref Range Status   SARS Coronavirus 2 by RT PCR NEGATIVE NEGATIVE Final    Comment: (NOTE) SARS-CoV-2 target nucleic acids are NOT DETECTED.  The SARS-CoV-2 RNA is generally detectable in upper respiratory specimens during the acute phase of infection. The lowest concentration of SARS-CoV-2 viral copies this assay can detect is 138 copies/mL. A negative result does not preclude SARS-Cov-2 infection and should not be used as the sole basis for treatment or other patient management decisions. A negative result may occur with  improper specimen  collection/handling, submission of specimen other than nasopharyngeal swab, presence of viral mutation(s) within the areas targeted by this assay, and inadequate number of viral copies(<138 copies/mL). A negative result must be combined with clinical observations, patient history, and epidemiological information. The expected result is Negative.  Fact Sheet for Patients:  EntrepreneurPulse.com.au  Fact Sheet for Healthcare Providers:  IncredibleEmployment.be  This test is no t yet approved or cleared by the [place] FDA and  has been authorized for detection and/or diagnosis of SARS-CoV-2 by FDA under an Emergency Use Authorization (EUA). This EUA will remain  in effect (meaning this test can be used) for the duration of the COVID-19 declaration under Section 564(b)(1) of the Act, 21 U.S.C.section 360bbb-3(b)(1), unless the authorization is terminated  or revoked sooner.       Influenza A by PCR NEGATIVE NEGATIVE Final   Influenza B by PCR NEGATIVE NEGATIVE Final    Comment: (NOTE) The Xpert Xpress SARS-CoV-2/FLU/RSV plus assay is intended as an aid in the diagnosis of influenza from Nasopharyngeal swab specimens and should not be used as a sole basis for treatment. Nasal washings and aspirates are unacceptable for Xpert Xpress SARS-CoV-2/FLU/RSV testing.  Fact Sheet for Patients: EntrepreneurPulse.com.au  Fact Sheet for Healthcare Providers: IncredibleEmployment.be  This test is not yet approved or cleared by the [place] FDA and has been authorized for detection and/or diagnosis of SARS-CoV-2 by FDA under an Emergency Use Authorization (EUA). This EUA will remain in effect (meaning this test can be used) for the duration of the COVID-19 declaration under Section 564(b)(1) of the Act, 21 U.S.C. section 360bbb-3(b)(1), unless the authorization is terminated or revoked.  Performed at [facility], [place] [address], [place], [place] [address]   Blood culture (routine x 2)     Status: None   Collection Time: 06/24/21  4:34 PM   Specimen: BLOOD  Result Value Ref Range Status   Specimen Description BLOOD SITE NOT SPECIFIED  Final   Special Requests   Final    BOTTLES DRAWN AEROBIC AND ANAEROBIC Blood Culture results may not be optimal due to an inadequate volume of blood received in culture bottles   Culture   Final    NO GROWTH 5 DAYS Performed at [facility], [place] [address], [place], [place] [address]    Report Status 06/29/2021 FINAL  Final  Surgical PCR screen     Status: None   Collection Time: 06/26/21  4:16 PM   Specimen: Nasal Mucosa; Nasal Swab  Result Value Ref Range Status   MRSA, PCR NEGATIVE NEGATIVE Final   Staphylococcus aureus NEGATIVE NEGATIVE Final    Comment: (NOTE) The Xpert SA Assay (FDA approved for NASAL specimens in patients 30 years of age and older), is one component of a comprehensive surveillance program. It is not intended to diagnose infection nor to guide or monitor treatment. Performed at [facility], [address] [place], [place] [address]   Fungus Culture With Stain     Status: None (Preliminary result)   Collection Time: 06/27/21 11:39 AM   Specimen: Soft Tissue, Other  Result Value Ref Range Status   Fungus Stain Final report  Final    Comment: (NOTE) Performed At: [facility] [place], [place] [id number] Rush Farmer MD SW:[id number]    Fungus (Mycology) Culture PENDING  Incomplete   Fungal Source TIS PROXIMAL 5TH METATARSAL INFECTED BONE SPEC A  Final    Comment: Performed at [facility], [place] [address] [place], [place] [address]  Aerobic/Anaerobic Culture w Gram Stain (surgical/deep wound)     Status: None   Collection Time: 06/27/21 11:39 AM   Specimen: Soft Tissue, Other  Result Value Ref Range Status   Specimen Description TISSUE  Final   Special Requests PROXIMAL  5TH METATARSAL INFECTED BONE SPEC A  Final   Gram Stain   Final    RARE WBC PRESENT,BOTH PMN AND MONONUCLEAR NO ORGANISMS SEEN    Culture   Final    RARE CORYNEBACTERIUM SPECIES Standardized susceptibility testing for this organism is not available. NO ANAEROBES ISOLATED Performed at [facility], [place] [address], [place], [place] [address]    Report Status 07/03/2021 FINAL  Final  Fungus Culture Result     Status: None   Collection Time: 06/27/21 11:39 AM  Result Value Ref Range Status   Result 1 Comment  Final    Comment: (NOTE) KOH/Calcofluor preparation:  no fungus observed. Performed At: [facility] [place], [place] [id number] Rush Farmer MD PQ:[id number]      Labs: Basic Metabolic Panel: Recent Labs  Lab 06/28/21 0258 06/29/21 0434 06/30/21 0204 07/01/21 0447 07/02/21 0439 07/03/21 0334 07/04/21 0329  NA 137 135 135 134* 136  --   --   K 3.7 3.9 4.3 4.3 4.1  --   --   CL 108 107 107 107 106  --   --   CO2 $Re'23 23 22 'YHY$ 20* 21*  --   --   GLUCOSE 71 128* 246* 249* 247*  --   --   BUN $Re'13 11 10 10 10  'EVw$ --   --   CREATININE 1.62* 1.41* 1.29* 1.29* 1.28*  --   --   CALCIUM 8.6* 8.2* 8.4* 8.9 8.8*  --   --   MG 1.8 1.8 1.7 1.7 1.6* 1.5* 1.6*  PHOS 2.5 3.2 2.5 2.6 2.7 2.9 3.1   Liver Function Tests: Recent Labs  Lab 06/28/21 0258 06/29/21 0434 06/30/21 0204 07/01/21 0447 07/02/21 0439  AST 187* 172* 215* 211* 218*  ALT 303* 309* 391* 432* 440*  ALKPHOS 34* 38 44 42 39  BILITOT 0.6 0.5 0.8 0.4 0.4  PROT 6.6 6.5 6.6 7.1 6.9  ALBUMIN 2.7* 2.7* 2.7* 2.8* 2.8*   CBC: Recent Labs  Lab 06/30/21 0204 07/01/21 0447 07/02/21 0439 07/03/21 0334 07/04/21 0329  WBC 10.4 10.6* 11.5* 11.5* 11.2*  NEUTROABS 6.6 7.0 7.5 7.8* 7.0  HGB 13.2 12.8 12.8 12.8 12.8  HCT 39.5 38.9 38.4 38.2 38.7  MCV 94.7 96.3 96.2 96.0 94.9  PLT 374 385 379 344 353   CBG: Recent Labs  Lab 07/03/21 1110 07/03/21 1604 07/03/21 2106 07/04/21 0649  07/04/21 1229  GLUCAP 220* 265* 267* 199* 253*   Hgb A1c No results for input(s): HGBA1C in the last 72 hours. Lipid Profile No results for input(s): CHOL,  HDL, LDLCALC, TRIG, CHOLHDL, LDLDIRECT in the last 72 hours. Thyroid function studies No results for input(s): TSH, T4TOTAL, T3FREE, THYROIDAB in the last 72 hours.  Invalid input(s): FREET3 Urinalysis No results found for: COLORURINE, APPEARANCEUR, LABSPEC, PHURINE, GLUCOSEU, HGBUR, BILIRUBINUR, KETONESUR, PROTEINUR, UROBILINOGEN, NITRITE, LEUKOCYTESUR  FURTHER DISCHARGE INSTRUCTIONS:   Get Medicines reviewed and adjusted: Please take all your medications with you for your next visit with your Primary MD   Laboratory/radiological data: Please request your Primary MD to go over all hospital tests and procedure/radiological results at the follow up, please ask your Primary MD to get all Hospital records sent to his/her office.   In some cases, they will be blood work, cultures and biopsy results pending at the time of your discharge. Please request that your primary care M.D. goes through all the records of your hospital data and follows up on these results.   Also Note the following: If you experience worsening of your admission symptoms, develop shortness of breath, life threatening emergency, suicidal or homicidal thoughts you must seek medical attention immediately by calling 911 or calling your MD immediately  if symptoms less severe.   You must read complete instructions/literature along with all the possible adverse reactions/side effects for all the Medicines you take and that have been prescribed to you. Take any new Medicines after you have completely understood and accpet all the possible adverse reactions/side effects.    Do not drive when taking Pain medications or sleeping medications (Benzodaizepines)   Do not take more than prescribed Pain, Sleep and Anxiety Medications. It is not advisable to combine anxiety,sleep  and pain medications without talking with your primary care practitioner   Special Instructions: If you have smoked or chewed Tobacco  in the last 2 yrs please stop smoking, stop any regular Alcohol  and or any Recreational drug use.   Wear Seat belts while driving.   Please note: You were cared for by a hospitalist during your hospital stay. Once you are discharged, your primary care physician will handle any further medical issues. Please note that NO REFILLS for any discharge medications will be authorized once you are discharged, as it is imperative that you return to your primary care physician (or establish a relationship with a primary care physician if you do not have one) for your post hospital discharge needs so that they can reassess your need for medications and monitor your lab values.  Time coordinating discharge: 40 minutes  SIGNED:  Marzetta Board, MD, PhD 07/04/2021, 12:40 PM

## 2021-07-04 NOTE — Progress Notes (Signed)
Discharge instructions given. No concerns voiced. Pt encouraged to stop by pharmacy and pick up med that was e-prescribed by MD. Voiced understanding. Left unit in wheelchair pushed by nurse tech accompanied by 2 female family members. Left in stable condition.

## 2021-07-04 NOTE — TOC Transition Note (Signed)
Transition of Care [facility]) - CM/SW Discharge Note   Patient Details  Name: Katrina Wolfe MRN: [id number] Date of Birth: August 03, 1966  [facility]) CM/SW Contact:  Sharin Mons, RN Phone Number: 07/04/2021, 12:07 PM   Clinical Narrative:    Patient will DC to: home  Anticipated DC date: 07/04/2021 Family notified: yes Transport by: car   Per MD patient ready for DC today. RN, patient, patient's family,  home health agencies ( [facility] and [facility]) notified of DC.  Home infusion completed @ bedside by Jeannene Patella / [facility]. DME: W/C, referral made with [facility], however, pt unable to receive 2/2 pt received W/C  in 2020. Pt states old W/C  without arm rest. [facility] made are and states will f/u with pt if they can land armrest. Pt without Rx medconcerns. Post hospital f/u noted on AVS. Family to provide transportation to home.   RNCM will sign off for now as intervention is no longer needed. Please consult [place] again if new needs arise.    Final next level of care: [place] Barriers to Discharge: No Barriers Identified   Patient Goals and CMS Choice     Choice offered to / list presented to : Patient  Discharge Placement                       Discharge Plan and Services   Discharge Planning Services: CM Consult            DME Arranged: Wheelchair manual DME Agency: [facility] Date DME Agency Contacted: 07/04/21 Time DME Agency Contacted: 1207 Representative spoke with at DME Agency: Freda Munro HH Arranged: RN, PT ([place]) [place]: [facility] Alvis Lemmings PT) Date [facility] Agency Contacted: 07/01/21 Time [place]: [phone] Representative spoke with at [place]: H. Rivera Colon ([place]) Interventions     Readmission Risk Interventions No flowsheet data found.

## 2021-07-05 DIAGNOSIS — L97404 Non-pressure chronic ulcer of unspecified heel and midfoot with necrosis of bone: Secondary | ICD-10-CM | POA: Diagnosis not present

## 2021-07-05 DIAGNOSIS — E08621 Diabetes mellitus due to underlying condition with foot ulcer: Secondary | ICD-10-CM | POA: Diagnosis not present

## 2021-07-06 ENCOUNTER — Telehealth (INDEPENDENT_AMBULATORY_CARE_PROVIDER_SITE_OTHER): Payer: Medicare Other | Admitting: Internal Medicine

## 2021-07-06 ENCOUNTER — Encounter: Payer: Self-pay | Admitting: Internal Medicine

## 2021-07-06 ENCOUNTER — Other Ambulatory Visit: Payer: Self-pay

## 2021-07-06 DIAGNOSIS — E08621 Diabetes mellitus due to underlying condition with foot ulcer: Secondary | ICD-10-CM | POA: Diagnosis not present

## 2021-07-06 DIAGNOSIS — M868X7 Other osteomyelitis, ankle and foot: Secondary | ICD-10-CM | POA: Diagnosis not present

## 2021-07-06 DIAGNOSIS — R7989 Other specified abnormal findings of blood chemistry: Secondary | ICD-10-CM | POA: Diagnosis not present

## 2021-07-06 DIAGNOSIS — Z21 Asymptomatic human immunodeficiency virus [HIV] infection status: Secondary | ICD-10-CM | POA: Diagnosis not present

## 2021-07-06 DIAGNOSIS — B20 Human immunodeficiency virus [HIV] disease: Secondary | ICD-10-CM

## 2021-07-06 DIAGNOSIS — L97404 Non-pressure chronic ulcer of unspecified heel and midfoot with necrosis of bone: Secondary | ICD-10-CM | POA: Diagnosis not present

## 2021-07-06 NOTE — Assessment & Plan Note (Signed)
Will continue daptomycin and ceftriaxone for OPAT with lab monitoring.  Plan for 6 weeks from date of surgery on 06/27/21.  End date 08/08/21.  She follows up with surgery on 07/18/21.  Discussed wound care and off loading.  ABI's were normal during recent admission.

## 2021-07-06 NOTE — Assessment & Plan Note (Signed)
  HIV is well controlled and followed by Dr Baxter Flattery.  She has appt in October for this.

## 2021-07-06 NOTE — Progress Notes (Signed)
[facility] for Infectious Disease  Reason for Consult: HFU for left foot OM  Referring Provider: Louanne Skye   HPI:    Katrina Wolfe is a 55 y.o. female with PMHx as below who presents to the clinic for follow up of left foot OM.   She has a hx of well controlled HIV that is managed by Dr Baxter Flattery and she is currently on [place].  I saw her during recent admission for left foot osteomyelitis. She has a history of chronic ulceration of 5th left metatarsal 2/2 cavernous foot deformity.   Recently had worsening ulcer with malodorous drainage.  MRI worrisome for OM.  She went for I&D after several days of abx and cultures grew corynebacterium.  She is on daptomycin and ceftriaxone for planned 6 weeks from date of her surgery.  End date 08/08/21.  Home health is coming out today for labs and dressing changes.  She feels well and has been holding her lipitor while on dapto.       Patient's Medications  New Prescriptions   No medications on file  Previous Medications   ALBUTEROL (VENTOLIN HFA) 108 (90 BASE) MCG/ACT INHALER    Inhale 2 puffs into the lungs every 6 (six) hours as needed for wheezing or shortness of breath.   AMLODIPINE (NORVASC) 5 MG TABLET    Take 5 mg by mouth in the morning.   ASPIRIN EC 81 MG TABLET    Take 1 tablet (81 mg total) by mouth 2 (two) times daily.   ASPIRIN EC 81 MG TABLET    Take 81 mg by mouth in the morning. Swallow whole.   ATORVASTATIN (LIPITOR) 10 MG TABLET    Take 10 mg by mouth every morning.    ATROPINE 1 % OPHTHALMIC SOLUTION    Place 1 drop into both eyes 2 (two) times daily.   BICTEGRAVIR-EMTRICITABINE-TENOFOVIR AF (BIKTARVY) 50-200-25 MG TABS TABLET    Take 1 tablet by mouth at bedtime.   BIKTARVY 50-200-25 MG TABS TABLET    TAKE ONE TABLET BY MOUTH every evening   CALCIUM CARBONATE (TUMS - DOSED IN MG ELEMENTAL CALCIUM) 500 MG CHEWABLE TABLET    Chew 1 tablet by mouth as needed for indigestion or heartburn.   CALCIUM CARBONATE-VITAMIN D  600-200 MG-UNIT TABS    1 tablet with a meal   CEFTRIAXONE (ROCEPHIN) IVPB    Inject 2 g into the vein daily. Indication:  osteomyelitis  First Dose: No Last Day of Therapy:  08/08/2021 Labs - Once weekly:  CBC/D and BMP, Labs - Every other week:  ESR and CRP Method of administration: IV Push Method of administration may be changed at the discretion of home infusion pharmacist based upon assessment of the patient and/or caregiver's ability to self-administer the medication ordered.   CLONIDINE (CATAPRES) 0.3 MG TABLET    Take 0.3 mg by mouth 2 (two) times daily.   COCONUT OIL 1000 MG CAPS    2 capsules   DAPTOMYCIN (CUBICIN) IVPB    Inject 700 mg into the vein daily. Indication:  osteomyelitis  First Dose: No Last Day of Therapy:  08/08/2021 Labs - Twice weekly:  CBC/D, BMP, and CPK Labs - Every other week:  ESR and CRP Method of administration: IV Push Method of administration may be changed at the discretion of home infusion pharmacist based upon assessment of the patient and/or caregiver's ability to self-administer the medication ordered.   DIPHENHYDRAMINE (BENADRYL) 25 MG TABLET    Take  25 mg by mouth every 6 (six) hours as needed for allergies (or allergic reactions).   DULAGLUTIDE (TRULICITY) 3 IR/4.4RX SOPN    Inject 3 mg into the skin every Friday.   ERYTHROMYCIN OPHTHALMIC OINTMENT    SMARTSIG:1 CENTIMETER(S) In Eye(s) 3 Times Daily   FARXIGA 10 MG TABS TABLET    Take 10 mg by mouth in the morning.   FERROUS SULFATE 325 (65 FE) MG EC TABLET    1 tablet   GABAPENTIN (NEURONTIN) 100 MG CAPSULE    Take 100 mg by mouth 3 (three) times daily.   GABAPENTIN (NEURONTIN) 100 MG CAPSULE    Take 100 mg by mouth 4 (four) times daily.   GLIMEPIRIDE (AMARYL) 2 MG TABLET    Take 2 mg by mouth daily.   GLIMEPIRIDE (AMARYL) 2 MG TABLET    Take 2 mg by mouth 2 (two) times daily.   HUMALOG MIX 75/25 KWIKPEN (75-25) 100 UNIT/ML KWIKPEN    Inject 56 Units into the skin 2 (two) times daily.     HYDROCODONE-ACETAMINOPHEN (NORCO/VICODIN) 5-325 MG TABLET    Take 1 tablet by mouth every 6 (six) hours as needed for severe pain.   HYDROCODONE-ACETAMINOPHEN (NORCO/VICODIN) 5-325 MG TABLET    Take 1-2 tablets by mouth every 4 (four) hours as needed for moderate pain (pain score 4-6).   INSULIN LISPRO PROT & LISPRO (HUMALOG 75/25 MIX) (75-25) 100 UNIT/ML KWIKPEN    Inject 60 Units into the skin in the morning and at bedtime.   INSULIN SYRINGE-NEEDLE U-100 (INSULIN SYRINGE 1CC/31GX5/16") 31G X 5/16" 1 ML MISC    1 Units by Does not apply route 2 (two) times daily.   JARDIANCE 10 MG TABS TABLET    Take 10 mg by mouth every morning.   LANCETS (ONETOUCH ULTRASOFT) LANCETS       LISINOPRIL-HYDROCHLOROTHIAZIDE (PRINZIDE,ZESTORETIC) 20-25 MG TABLET    Take 1 tablet by mouth daily.   LISINOPRIL-HYDROCHLOROTHIAZIDE (ZESTORETIC) 20-25 MG TABLET    Take 1 tablet by mouth daily.   METHOCARBAMOL (ROBAXIN) 500 MG TABLET    Take 1 tablet (500 mg total) by mouth every 8 (eight) hours as needed for muscle spasms.   METHOCARBAMOL (ROBAXIN) 500 MG TABLET    Take 500 mg by mouth every 8 (eight) hours as needed for muscle spasms.   NAPROXEN SODIUM (ALEVE) 220 MG TABLET    Take 220-440 mg by mouth 2 (two) times daily as needed (for pain or headaches).   NORETHINDRONE (AYGESTIN) 5 MG TABLET    Take 5 mg by mouth in the morning and at bedtime.   ONE TOUCH ULTRA TEST TEST STRIP       TRAMADOL (ULTRAM) 50 MG TABLET    Take 50 mg by mouth every 4 (four) hours as needed (for pain).   TRULICITY 5.40 GQ/6.7YP SOPN    Inject into the skin. Taking 3.3   VICTOZA 18 MG/3ML SOPN    Inject 1.8 mg into the skin every morning.  Modified Medications   No medications on file  Discontinued Medications   No medications on file      Past Medical History:  Diagnosis Date   Abnormal uterine bleeding (AUB)    Arthritis    knees, fingers   Asthma    as child, no problems in 20 yrs   Cancer (HCC)    CKD (chronic kidney disease),  stage II    Congenital cardiomegaly    per pt has always been told since childhood and siblings  Diabetes mellitus without complication (HCC)    GERD (gastroesophageal reflux disease)    History of genital warts    HIV (human immunodeficiency virus infection) ([place])    DX 1998--  MONITORED BY INFECTIOUS DISEASE-- DR Carlyle Basques   HIV (human immunodeficiency virus infection) ([place])    Hypercholesterolemia    Hypertension    Intermittent palpitations    mild-- no meds   Legally blind in left eye, as defined in [place]    trauma as child   Renal disorder    Tuberculosis    ? 2005 or 2007   Type 2 diabetes mellitus ([place])    Type II    Uterine fibroid    VIN III (vulvar intraepithelial neoplasia III)    and Verrucoid lesion on the mons   Wears glasses     Social History   Tobacco Use   Smoking status: Never   Smokeless tobacco: Never  Vaping Use   Vaping Use: Never used  Substance Use Topics   Alcohol use: No    Alcohol/week: 0.0 standard drinks   Drug use: No    Family History  Problem Relation Age of Onset   Hypertension Mother    Diabetes Father    Cancer Brother    Breast cancer Cousin     Allergies  Allergen Reactions   Shellfish Allergy Anaphylaxis    All types shellfish   Shellfish Allergy Anaphylaxis and Swelling   Sulfamethoxazole-Trimethoprim Hives and Other (See Comments)   Bactrim [Sulfamethoxazole-Trimethoprim] Hives   Shellfish-Derived Products Other (See Comments)    Review of Systems  Constitutional: Negative.   Respiratory: Negative.    Cardiovascular: Negative.   Gastrointestinal: Negative.   Musculoskeletal: Negative.      OBJECTIVE:     Labs and Microbiology:  CBC Latest Ref Rng & Units 07/04/2021 07/03/2021 07/02/2021  WBC 4.0 - 10.5 K/uL 11.2(H) 11.5(H) 11.5(H)  Hemoglobin 12.0 - 15.0 g/dL 12.8 12.8 12.8  Hematocrit 36.0 - 46.0 % 38.7 38.2 38.4  Platelets 150 - 400 K/uL 353 344 379   CMP Latest Ref Rng & Units 07/02/2021  07/01/2021 06/30/2021  Glucose 70 - 99 mg/dL 247(H) 249(H) 246(H)  BUN 6 - 20 mg/dL _0 Creatinine 0.44 - 1.00 mg/dL 1.28(H) 1.29(H) 1.29(H)  Sodium 135 - 145 mmol/L 136 134(L) 135  Potassium 3.5 - 5.1 mmol/L 4.1 4.3 4.3  Chloride 98 - 111 mmol/L 106 107 107  CO2 22 - 32 mmol/L 21(L) 20(L) 22  Calcium 8.9 - 10.3 mg/dL 8.8(L) 8.9 8.4(L)  Total Protein 6.5 - 8.1 g/dL 6.9 7.1 6.6  Total Bilirubin 0.3 - 1.2 mg/dL 0.4 0.4 0.8  Alkaline Phos 38 - 126 U/L 39 42 44  AST 15 - 41 U/L 218(H) 211(H) 215(H)  ALT 0 - 44 U/L 440(H) 432(H) 391(H)     Recent Results (from the past 240 hour(s))  Surgical PCR screen     Status: None   Collection Time: 06/26/21  4:16 PM   Specimen: Nasal Mucosa; Nasal Swab  Result Value Ref Range Status   MRSA, PCR NEGATIVE NEGATIVE Final   Staphylococcus aureus NEGATIVE NEGATIVE Final    Comment: (NOTE) The Xpert SA Assay (FDA approved for NASAL specimens in patients 33 years of age and older), is one component of a comprehensive surveillance program. It is not intended to diagnose infection nor to guide or monitor treatment. Performed at [facility], [place] [address], [place], [place] [address]   Fungus Culture With  Stain     Status: None (Preliminary result)   Collection Time: 06/27/21 11:39 AM   Specimen: Soft Tissue, Other  Result Value Ref Range Status   Fungus Stain Final report  Final    Comment: (NOTE) Performed At: [facility] [place], [place] [id number] Rush Farmer MD AO:[id number]    Fungus (Mycology) Culture PENDING  Incomplete   Fungal Source TIS PROXIMAL 5TH METATARSAL INFECTED BONE SPEC A  Final    Comment: Performed at [facility], [place] [address] [place], [place] [address]  Aerobic/Anaerobic Culture w Gram Stain (surgical/deep wound)     Status: None   Collection Time: 06/27/21 11:39 AM   Specimen: Soft Tissue, Other  Result Value Ref Range Status   Specimen Description TISSUE  Final   Special  Requests PROXIMAL 5TH METATARSAL INFECTED BONE SPEC A  Final   Gram Stain   Final    RARE WBC PRESENT,BOTH PMN AND MONONUCLEAR NO ORGANISMS SEEN    Culture   Final    RARE CORYNEBACTERIUM SPECIES Standardized susceptibility testing for this organism is not available. NO ANAEROBES ISOLATED Performed at [facility], [place] [address], [place], [place] [address]    Report Status 07/03/2021 FINAL  Final  Fungus Culture Result     Status: None   Collection Time: 06/27/21 11:39 AM  Result Value Ref Range Status   Result 1 Comment  Final    Comment: (NOTE) KOH/Calcofluor preparation:  no fungus observed. 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   Marrow edema in an ununited fracture fragment at the base of the fifth metatarsal and adjacent 1.5 cm of the fifth metatarsal shaft is worrisome for osteomyelitis. There is an overlying skin wound but no abscess.   ASSESSMENT & PLAN:    Elevated LFTs Newly elevated AST/ALT with normal alk phos and GGT.  RUQ [place] was normal.  CK mildly elevated and trending down on last hospital measurement.  Viral hepatitis was negative in hospital as well.  Suspect mild muscle breakdown process and planning for close lab monitoring.  Do not suspect her Biktarvy to be the culprit and recommended this be continued.  Her statin is on hold temporarily while treating her infection.   Osteomyelitis of left foot ([place]) Will continue daptomycin and ceftriaxone for OPAT with lab monitoring.  Plan for 6 weeks from date of surgery on 06/27/21.  End date 08/08/21.  She follows up with surgery on 07/18/21.  Discussed wound care and off loading.  ABI's were normal during recent admission.    HIV (human immunodeficiency virus infection) ([place])  HIV is well controlled and followed by Dr Baxter Flattery.  She has appt in October  for this.   Follow up with me 08/15/21.   [facility] Infectious [facility] 07/06/2021, 10:34 AM   Virtual Visit via Telephone Note   I connected with Katrina Wolfe on 07/06/21 at 10:34 AM by telephone and verified that I am speaking with the correct person using two identifiers.   I discussed the limitations, risks, security and privacy concerns of performing an evaluation and management service by telephone and the availability of in person appointments. I also discussed with the patient that there may be a patient responsible charge related to this service. The patient expressed understanding and agreed  to proceed.  Patient location: home My location: rcid Duration of call: 8 min

## 2021-07-06 NOTE — Assessment & Plan Note (Signed)
Newly elevated AST/ALT with normal alk phos and GGT.  RUQ [place] was normal.  CK mildly elevated and trending down on last hospital measurement.  Viral hepatitis was negative in hospital as well.  Suspect mild muscle breakdown process and planning for close lab monitoring.  Do not suspect her Biktarvy to be the culprit and recommended this be continued.  Her statin is on hold temporarily while treating her infection.

## 2021-07-08 DIAGNOSIS — M170 Bilateral primary osteoarthritis of knee: Secondary | ICD-10-CM | POA: Diagnosis not present

## 2021-07-08 DIAGNOSIS — Z7982 Long term (current) use of aspirin: Secondary | ICD-10-CM | POA: Diagnosis not present

## 2021-07-08 DIAGNOSIS — E1122 Type 2 diabetes mellitus with diabetic chronic kidney disease: Secondary | ICD-10-CM | POA: Diagnosis not present

## 2021-07-08 DIAGNOSIS — Z452 Encounter for adjustment and management of vascular access device: Secondary | ICD-10-CM | POA: Diagnosis not present

## 2021-07-08 DIAGNOSIS — E1169 Type 2 diabetes mellitus with other specified complication: Secondary | ICD-10-CM | POA: Diagnosis not present

## 2021-07-08 DIAGNOSIS — E1140 Type 2 diabetes mellitus with diabetic neuropathy, unspecified: Secondary | ICD-10-CM | POA: Diagnosis not present

## 2021-07-08 DIAGNOSIS — Z794 Long term (current) use of insulin: Secondary | ICD-10-CM | POA: Diagnosis not present

## 2021-07-08 DIAGNOSIS — D631 Anemia in chronic kidney disease: Secondary | ICD-10-CM | POA: Diagnosis not present

## 2021-07-08 DIAGNOSIS — H548 Legal blindness, as defined in USA: Secondary | ICD-10-CM | POA: Diagnosis not present

## 2021-07-08 DIAGNOSIS — Z792 Long term (current) use of antibiotics: Secondary | ICD-10-CM | POA: Diagnosis not present

## 2021-07-08 DIAGNOSIS — E785 Hyperlipidemia, unspecified: Secondary | ICD-10-CM | POA: Diagnosis not present

## 2021-07-08 DIAGNOSIS — I1310 Hypertensive heart and chronic kidney disease without heart failure, with stage 1 through stage 4 chronic kidney disease, or unspecified chronic kidney disease: Secondary | ICD-10-CM | POA: Diagnosis not present

## 2021-07-08 DIAGNOSIS — D509 Iron deficiency anemia, unspecified: Secondary | ICD-10-CM | POA: Diagnosis not present

## 2021-07-08 DIAGNOSIS — Z9181 History of falling: Secondary | ICD-10-CM | POA: Diagnosis not present

## 2021-07-08 DIAGNOSIS — T8469XA Infection and inflammatory reaction due to internal fixation device of other site, initial encounter: Secondary | ICD-10-CM | POA: Diagnosis not present

## 2021-07-08 DIAGNOSIS — B9689 Other specified bacterial agents as the cause of diseases classified elsewhere: Secondary | ICD-10-CM | POA: Diagnosis not present

## 2021-07-08 DIAGNOSIS — Z7984 Long term (current) use of oral hypoglycemic drugs: Secondary | ICD-10-CM | POA: Diagnosis not present

## 2021-07-08 DIAGNOSIS — Z79899 Other long term (current) drug therapy: Secondary | ICD-10-CM | POA: Diagnosis not present

## 2021-07-08 DIAGNOSIS — N1832 Chronic kidney disease, stage 3b: Secondary | ICD-10-CM | POA: Diagnosis not present

## 2021-07-08 DIAGNOSIS — M869 Osteomyelitis, unspecified: Secondary | ICD-10-CM | POA: Diagnosis not present

## 2021-07-11 ENCOUNTER — Telehealth: Payer: Self-pay

## 2021-07-11 NOTE — Telephone Encounter (Signed)
Patient called stating that her wound vac has completely stopped working and her appointment is not until 07/20/2021 with Jeneen Rinks.  Would like to know if she can be seen sooner? Patient had left foot surgery on 06/27/2021.  Cb# [phone].  Please advise.  Thank you.

## 2021-07-12 ENCOUNTER — Telehealth: Payer: Self-pay | Admitting: Family

## 2021-07-12 NOTE — Telephone Encounter (Signed)
P. T calling on behalf of pt. Pt was sent home with a wound vac but no home health nurse to get the wound cleaned up. Advised that pt has post op appt 7/27. If there are any questions the best call back number is [phone].

## 2021-07-12 NOTE — Telephone Encounter (Signed)
See message below. Thank you

## 2021-07-12 NOTE — Telephone Encounter (Signed)
Pt has an appt tomorrow and will remove the wound vac at that time.

## 2021-07-13 ENCOUNTER — Encounter: Payer: Self-pay | Admitting: Family

## 2021-07-13 ENCOUNTER — Ambulatory Visit (INDEPENDENT_AMBULATORY_CARE_PROVIDER_SITE_OTHER): Payer: Medicare Other | Admitting: Family

## 2021-07-13 DIAGNOSIS — L98491 Non-pressure chronic ulcer of skin of other sites limited to breakdown of skin: Secondary | ICD-10-CM | POA: Diagnosis not present

## 2021-07-13 DIAGNOSIS — E089 Diabetes mellitus due to underlying condition without complications: Secondary | ICD-10-CM | POA: Diagnosis not present

## 2021-07-13 DIAGNOSIS — L97404 Non-pressure chronic ulcer of unspecified heel and midfoot with necrosis of bone: Secondary | ICD-10-CM | POA: Diagnosis not present

## 2021-07-13 DIAGNOSIS — M869 Osteomyelitis, unspecified: Secondary | ICD-10-CM | POA: Diagnosis not present

## 2021-07-13 DIAGNOSIS — L97424 Non-pressure chronic ulcer of left heel and midfoot with necrosis of bone: Secondary | ICD-10-CM

## 2021-07-13 DIAGNOSIS — E08621 Diabetes mellitus due to underlying condition with foot ulcer: Secondary | ICD-10-CM | POA: Diagnosis not present

## 2021-07-13 NOTE — Progress Notes (Signed)
Post-Op Visit Note   Patient: Katrina Wolfe           Date of Birth: 24-Feb-1966           MRN: [id number] Visit Date: 07/13/2021 PCP: Wenda Low, MD  Chief Complaint:  Chief Complaint  Patient presents with   Left Foot - Routine Post Op    06/27/21 left foot I&D ulcer     HPI:  HPI The patient is a 55 year old woman seen today status post left foot irrigation and debridement with wound VAC placement unfortunately her wound VAC did malfunction and was in the place but not pumping for several days.  She is currently getting IV antibiotic therapy through PICC line  Ortho Exam On examination of the left foot laterally her incision is intact with sutures there is one central area of ulceration this is 5 mm deep and is about the size of a quarter.  There is granulation tissue in the wound bed there is significant surrounding maceration.  No erythema no drainage no sign of infection  Visit Diagnoses: No diagnosis found.  Plan: Begin daily Dial soap cleansing.  Dry dressing changes.  She will packed the wound open with gauze follow-up in the office in about 10 days Follow-Up Instructions: No follow-ups on file.   Imaging: No results found.  Orders:  No orders of the defined types were placed in this encounter.  No orders of the defined types were placed in this encounter.    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, with necrosis of bone ([place])    Diabetes mellitus type 2, uncontrolled, with complications ([place]) 20/25/4270   Diabetic nephropathy ([place]) 06/26/2021   CKD (chronic kidney disease), stage III ([place]) 06/26/2021   HTN (hypertension) 06/24/2021   HIV (human immunodeficiency virus infection) ([place]) 06/24/2021   DM2 (diabetes mellitus, type 2) ([place]) 06/24/2021   HLD (hyperlipidemia) 06/24/2021   Osteomyelitis of left foot ([place]) 06/24/2021   Elevated LFTs  06/24/2021   Acute-on-chronic kidney injury ([place]) 06/24/2021   Osteomyelitis of foot ([place]) 62/37/6283   Hardware complicating wound infection ([place])    Subacute osteomyelitis, left ankle and foot ([place])    Open displaced fracture of fifth metatarsal bone of left foot    Healthcare maintenance 04/03/2019   HIV disease ([place]) 10/27/2015   DM type 2 (diabetes mellitus, type 2) ([place]) 10/27/2015   Hyperlipidemia 10/27/2015   Essential hypertension 10/27/2015   Severe vulvar dysplasia, histologically confirmed 10/15/2015   Past Medical History:  Diagnosis Date   Abnormal uterine bleeding (AUB)    Arthritis    knees, fingers   Asthma    as child, no problems in 20 yrs   Cancer ([place])    CKD (chronic kidney disease), stage II    Congenital cardiomegaly    per pt has always been told since childhood and siblings    Diabetes mellitus without complication ([place])    GERD (gastroesophageal reflux disease)    History of genital warts    HIV (human immunodeficiency virus infection) ([place])    [place]-- DR Carlyle Basques   HIV (human immunodeficiency virus infection) ([place])    Hypercholesterolemia    Hypertension    Intermittent palpitations    mild-- no meds   Legally blind in left eye, as defined in [place]    trauma as child   Renal disorder  Tuberculosis    ? 2005 or 2007   Type 2 diabetes mellitus ([place])    Type II    Uterine fibroid    VIN III (vulvar intraepithelial neoplasia III)    and Verrucoid lesion on the mons   Wears glasses     Family History  Problem Relation Age of Onset   Hypertension Mother    Diabetes Father    Cancer Brother    Breast cancer Cousin     Past Surgical History:  Procedure Laterality Date   CESAREAN SECTION  2001  &  6222   CO2 LASER APPLICATION N/A 97/08/8920   Procedure: CO2 LASER APPLICATION AND WIDE VOCAL EXCSION OF LESION ON MONS PUBIS;  Surgeon: Marti Sleigh, MD;  Location: [facility];   Service: Gynecology;  Laterality: N/A;   CASE CANCELLED    CO2 LASER APPLICATION N/A 1/94/1740   Procedure: CO2 LASER OF VULVAR;  Surgeon: Marti Sleigh, MD;  Location: [facility];  Service: Gynecology;  Laterality: N/A;   CORNEAL TRANSPLANT Left 2006   failed   D & C HYSTEROSCOPY /  RESECTION FIBROID  2004   DILATATION & CURETTAGE/HYSTEROSCOPY WITH MYOSURE N/A 10/01/2019   Procedure: DILATATION & CURETTAGE/HYSTEROSCOPY WITH MYOSURE and HYDROTHERMAL ABLATION;  Surgeon: Thurnell Lose, MD;  Location: [facility];  Service: Gynecology;  Laterality: N/A;  HTA rep will be here. Confirmed on 09/25/19 CS   HARDWARE REMOVAL Left 04/04/2019   Procedure: EXCISION BONE AND REMOVE DEEP HARDWARE LEFT 5TH METATARSAL;  Surgeon: Newt Minion, MD;  Location: [facility];  Service: Orthopedics;  Laterality: Left;   I & D EXTREMITY Left 06/27/2021   Procedure: IRRIGATION AND DEBRIDEMENT FOOT ULCER;  Surgeon: Jessy Oto, MD;  Location: [facility];  Service: Orthopedics;  Laterality: Left;   KNEE ARTHROSCOPY W/ ACL RECONSTRUCTION Bilateral 2008   ORIF TOE FRACTURE Left 08/02/2017   Procedure: OPEN REDUCTION INTERNAL FIXATION (ORIF) FIFTH METATARSAL (TOE) BASE FRACTURE;  Surgeon: Wylene Simmer, MD;  Location: [facility];  Service: Orthopedics;  Laterality: Left;   VULVECTOMY N/A 01/07/2016   Procedure: WIDE LOCAL EXCISION;  Surgeon: Marti Sleigh, MD;  Location[facility];  Service: Gynecology;  Laterality: N/A;  mons pubis as site   Social History   Occupational History   Not on file  Tobacco Use   Smoking status: Never   Smokeless tobacco: Never  Vaping Use   Vaping Use: Never used  Substance and Sexual Activity   Alcohol use: No    Alcohol/week: 0.0 standard drinks   Drug use: No   Sexual activity: Yes    Partners: Male    Birth control/protection: None, Condom    Comment: declined condoms 03/07/21

## 2021-07-15 DIAGNOSIS — E11621 Type 2 diabetes mellitus with foot ulcer: Secondary | ICD-10-CM | POA: Diagnosis not present

## 2021-07-15 DIAGNOSIS — E1165 Type 2 diabetes mellitus with hyperglycemia: Secondary | ICD-10-CM | POA: Diagnosis not present

## 2021-07-15 DIAGNOSIS — I10 Essential (primary) hypertension: Secondary | ICD-10-CM | POA: Diagnosis not present

## 2021-07-15 DIAGNOSIS — E1140 Type 2 diabetes mellitus with diabetic neuropathy, unspecified: Secondary | ICD-10-CM | POA: Diagnosis not present

## 2021-07-15 DIAGNOSIS — E1142 Type 2 diabetes mellitus with diabetic polyneuropathy: Secondary | ICD-10-CM | POA: Diagnosis not present

## 2021-07-15 DIAGNOSIS — E7800 Pure hypercholesterolemia, unspecified: Secondary | ICD-10-CM | POA: Diagnosis not present

## 2021-07-15 DIAGNOSIS — N182 Chronic kidney disease, stage 2 (mild): Secondary | ICD-10-CM | POA: Diagnosis not present

## 2021-07-18 DIAGNOSIS — E08621 Diabetes mellitus due to underlying condition with foot ulcer: Secondary | ICD-10-CM | POA: Diagnosis not present

## 2021-07-18 DIAGNOSIS — M869 Osteomyelitis, unspecified: Secondary | ICD-10-CM | POA: Diagnosis not present

## 2021-07-18 DIAGNOSIS — L98491 Non-pressure chronic ulcer of skin of other sites limited to breakdown of skin: Secondary | ICD-10-CM | POA: Diagnosis not present

## 2021-07-18 DIAGNOSIS — E089 Diabetes mellitus due to underlying condition without complications: Secondary | ICD-10-CM | POA: Diagnosis not present

## 2021-07-18 DIAGNOSIS — L97404 Non-pressure chronic ulcer of unspecified heel and midfoot with necrosis of bone: Secondary | ICD-10-CM | POA: Diagnosis not present

## 2021-07-19 ENCOUNTER — Telehealth: Payer: Self-pay

## 2021-07-19 NOTE — Telephone Encounter (Signed)
Per MD called [facility] with orders to have CMP drawn with weekly labs and not BMP. Spoke with Jeani Hawking, Pharmacist who was able to take orders.  Leatrice Jewels, RMA

## 2021-07-20 ENCOUNTER — Encounter: Payer: Medicare Other | Admitting: Surgery

## 2021-07-20 ENCOUNTER — Telehealth: Payer: Self-pay | Admitting: Orthopedic Surgery

## 2021-07-20 DIAGNOSIS — M869 Osteomyelitis, unspecified: Secondary | ICD-10-CM | POA: Diagnosis not present

## 2021-07-20 DIAGNOSIS — H548 Legal blindness, as defined in USA: Secondary | ICD-10-CM | POA: Diagnosis not present

## 2021-07-20 DIAGNOSIS — E1169 Type 2 diabetes mellitus with other specified complication: Secondary | ICD-10-CM | POA: Diagnosis not present

## 2021-07-20 DIAGNOSIS — Z9181 History of falling: Secondary | ICD-10-CM | POA: Diagnosis not present

## 2021-07-20 DIAGNOSIS — Z792 Long term (current) use of antibiotics: Secondary | ICD-10-CM | POA: Diagnosis not present

## 2021-07-20 DIAGNOSIS — Z794 Long term (current) use of insulin: Secondary | ICD-10-CM | POA: Diagnosis not present

## 2021-07-20 DIAGNOSIS — Z452 Encounter for adjustment and management of vascular access device: Secondary | ICD-10-CM | POA: Diagnosis not present

## 2021-07-20 DIAGNOSIS — D631 Anemia in chronic kidney disease: Secondary | ICD-10-CM | POA: Diagnosis not present

## 2021-07-20 DIAGNOSIS — E119 Type 2 diabetes mellitus without complications: Secondary | ICD-10-CM | POA: Diagnosis not present

## 2021-07-20 DIAGNOSIS — E785 Hyperlipidemia, unspecified: Secondary | ICD-10-CM | POA: Diagnosis not present

## 2021-07-20 DIAGNOSIS — D509 Iron deficiency anemia, unspecified: Secondary | ICD-10-CM | POA: Diagnosis not present

## 2021-07-20 DIAGNOSIS — T8469XA Infection and inflammatory reaction due to internal fixation device of other site, initial encounter: Secondary | ICD-10-CM | POA: Diagnosis not present

## 2021-07-20 DIAGNOSIS — L97424 Non-pressure chronic ulcer of left heel and midfoot with necrosis of bone: Secondary | ICD-10-CM | POA: Diagnosis not present

## 2021-07-20 DIAGNOSIS — Z79899 Other long term (current) drug therapy: Secondary | ICD-10-CM | POA: Diagnosis not present

## 2021-07-20 DIAGNOSIS — Z7982 Long term (current) use of aspirin: Secondary | ICD-10-CM | POA: Diagnosis not present

## 2021-07-20 DIAGNOSIS — E1140 Type 2 diabetes mellitus with diabetic neuropathy, unspecified: Secondary | ICD-10-CM | POA: Diagnosis not present

## 2021-07-20 DIAGNOSIS — Z7984 Long term (current) use of oral hypoglycemic drugs: Secondary | ICD-10-CM | POA: Diagnosis not present

## 2021-07-20 DIAGNOSIS — N1832 Chronic kidney disease, stage 3b: Secondary | ICD-10-CM | POA: Diagnosis not present

## 2021-07-20 DIAGNOSIS — E1122 Type 2 diabetes mellitus with diabetic chronic kidney disease: Secondary | ICD-10-CM | POA: Diagnosis not present

## 2021-07-20 DIAGNOSIS — E08621 Diabetes mellitus due to underlying condition with foot ulcer: Secondary | ICD-10-CM | POA: Diagnosis not present

## 2021-07-20 DIAGNOSIS — M170 Bilateral primary osteoarthritis of knee: Secondary | ICD-10-CM | POA: Diagnosis not present

## 2021-07-20 DIAGNOSIS — I1310 Hypertensive heart and chronic kidney disease without heart failure, with stage 1 through stage 4 chronic kidney disease, or unspecified chronic kidney disease: Secondary | ICD-10-CM | POA: Diagnosis not present

## 2021-07-20 DIAGNOSIS — B9689 Other specified bacterial agents as the cause of diseases classified elsewhere: Secondary | ICD-10-CM | POA: Diagnosis not present

## 2021-07-20 NOTE — Telephone Encounter (Signed)
Pt is s/p a left foot debridement there has not been a request received since the surgery. Unaware tht these items were needed buy the pt. Order entered through [facility] and will keep message to monitor processing of order. Will call pt to advise of status once received.

## 2021-07-20 NOTE — Telephone Encounter (Signed)
[name] with [facility] called stating the pt had surgery and was supposed to be sent home with a walker and bedside commode. Pt received neither and for three weeks [facility] has been trying to get it ordered. [name] would like to know if Dr.Duda could call [facility] and send in a rx for both items? [name] would like [place] to call and update the pt if this will be possible, and call him if there are any questions.  [facility]# [phone] [name]# [phone] * OK to LVM

## 2021-07-21 DIAGNOSIS — E08621 Diabetes mellitus due to underlying condition with foot ulcer: Secondary | ICD-10-CM | POA: Diagnosis not present

## 2021-07-21 DIAGNOSIS — L97404 Non-pressure chronic ulcer of unspecified heel and midfoot with necrosis of bone: Secondary | ICD-10-CM | POA: Diagnosis not present

## 2021-07-21 NOTE — Telephone Encounter (Signed)
Checked system and delivery date was yesterday 07/20/21 pt has an appt for follow up tomorrow.

## 2021-07-22 ENCOUNTER — Other Ambulatory Visit: Payer: Self-pay

## 2021-07-22 ENCOUNTER — Telehealth: Payer: Self-pay

## 2021-07-22 ENCOUNTER — Ambulatory Visit (INDEPENDENT_AMBULATORY_CARE_PROVIDER_SITE_OTHER): Payer: Medicare Other | Admitting: Physician Assistant

## 2021-07-22 ENCOUNTER — Encounter: Payer: Self-pay | Admitting: Physician Assistant

## 2021-07-22 DIAGNOSIS — L97424 Non-pressure chronic ulcer of left heel and midfoot with necrosis of bone: Secondary | ICD-10-CM

## 2021-07-22 DIAGNOSIS — E08621 Diabetes mellitus due to underlying condition with foot ulcer: Secondary | ICD-10-CM

## 2021-07-22 NOTE — Telephone Encounter (Signed)
While pt in the office today she confirmed she received the walker and bedside commode. She also wanted a tub bench chair and this order was entered in [facility]. I received a message from [employer] to advise the following :   I wanted to let you know that this equipment will be private pay.  [facility] Medicare does not cover these, and Medicaid paid for one 04/21/2019 and they will not cover another one until after 04/20/2022.  I'm not sure of the cost of these, but I believe they are around 60.00.  Please advise.  Thanks.  I called the pt to advise of this message and lm on vm for her to call back and let me know how she would like to proceed.

## 2021-07-22 NOTE — Progress Notes (Signed)
Office Visit Note   Patient: Katrina Wolfe           Date of Birth: 1966/02/03           MRN: [id number] Visit Date: 07/22/2021              Requested by: Wenda Low, MD [address],  [place] [address] PCP: Wenda Low, MD  No chief complaint on file.     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  She is status post irrigation and debridement of this 4 weeks ago with Dr. Louanne Skye.  She is on IV antibiotics completed later this month  Assessment & Plan: Visit Diagnoses: No diagnosis found.  Plan: Patient will follow-up with Dr. Sharol Given next week.  She would like to further discuss limb salvage options that were discussed with her while she was in the hospital continue with daily dressing changes and cleansing  Follow-Up Instructions: No follow-ups on file.   Ortho Exam  Patient is alert, oriented, no adenopathy, well-dressed, normal affect, normal respiratory effort. Examination of the wound is about 5 cm in diameter.  Remaining sutures were removed.  She has skin maceration cannot tunnel or probe to bone today.  No foul odor no ascending cellulitis  Imaging: No results found. No images are attached to the encounter.  Labs: Lab Results  Component Value Date   HGBA1C 8.8 (H) 06/24/2021   ESRSEDRATE 74 (H) 06/30/2021   CRP 0.9 06/30/2021   REPTSTATUS 07/03/2021 FINAL 06/27/2021   GRAMSTAIN  06/27/2021    RARE WBC PRESENT,BOTH PMN AND MONONUCLEAR NO ORGANISMS SEEN    CULT  06/27/2021    RARE CORYNEBACTERIUM SPECIES Standardized susceptibility testing for this organism is not available. NO ANAEROBES ISOLATED Performed at [facility], [place] [address], [place], [place] [address]    LABORGA PSEUDOMONAS AERUGINOSA 04/04/2019   LABORGA SERRATIA MARCESCENS 04/04/2019   LABORGA STAPHYLOCOCCUS AUREUS 04/04/2019     Lab Results  Component Value Date    ALBUMIN 2.8 (L) 07/02/2021   ALBUMIN 2.8 (L) 07/01/2021   ALBUMIN 2.7 (L) 06/30/2021    Lab Results  Component Value Date   MG 1.6 (L) 07/04/2021   MG 1.5 (L) 07/03/2021   MG 1.6 (L) 07/02/2021   No results found for: VD25OH  No results found for: PREALBUMIN CBC EXTENDED Latest Ref Rng & Units 07/04/2021 07/03/2021 07/02/2021  WBC 4.0 - 10.5 K/uL 11.2(H) 11.5(H) 11.5(H)  RBC 3.87 - 5.11 MIL/uL 4.08 3.98 3.99  HGB 12.0 - 15.0 g/dL 12.8 12.8 12.8  HCT 36.0 - 46.0 % 38.7 38.2 38.4  PLT 150 - 400 K/uL 353 344 379  NEUTROABS 1.7 - 7.7 K/uL 7.0 7.8(H) 7.5  LYMPHSABS 0.7 - 4.0 K/uL 2.8 2.2 2.4     There is no height or weight on file to calculate BMI.  Orders:  No orders of the defined types were placed in this encounter.  No orders of the defined types were placed in this encounter.    Procedures: No procedures performed  Clinical Data: No additional findings.  ROS:  All other systems negative, except as noted in the HPI. Review of Systems  Objective: Vital Signs: There were no vitals taken for this visit.  Specialty Comments:  No specialty comments available.  PMFS History: Patient Active Problem List   Diagnosis Date Noted   Diabetic ulcer of midfoot associated  with diabetes mellitus due to underlying condition, with necrosis of bone ([place])    Diabetes mellitus type 2, uncontrolled, with complications ([place]) 67/89/3810   Diabetic nephropathy ([place]) 06/26/2021   CKD (chronic kidney disease), stage III ([place]) 06/26/2021   HTN (hypertension) 06/24/2021   HIV (human immunodeficiency virus infection) ([place]) 06/24/2021   DM2 (diabetes mellitus, type 2) ([place]) 06/24/2021   HLD (hyperlipidemia) 06/24/2021   Osteomyelitis of left foot ([place]) 06/24/2021   Elevated LFTs 06/24/2021   Acute-on-chronic kidney injury ([place]) 06/24/2021   Osteomyelitis of foot ([place]) 17/51/0258   Hardware complicating wound infection ([place])    Subacute osteomyelitis, left ankle and foot ([place])    Open  displaced fracture of fifth metatarsal bone of left foot    Healthcare maintenance 04/03/2019   HIV disease ([place]) 10/27/2015   DM type 2 (diabetes mellitus, type 2) ([place]) 10/27/2015   Hyperlipidemia 10/27/2015   Essential hypertension 10/27/2015   Severe vulvar dysplasia, histologically confirmed 10/15/2015   Past Medical History:  Diagnosis Date   Abnormal uterine bleeding (AUB)    Arthritis    knees, fingers   Asthma    as child, no problems in 20 yrs   Cancer ([place])    CKD (chronic kidney disease), stage II    Congenital cardiomegaly    per pt has always been told since childhood and siblings    Diabetes mellitus without complication ([place])    GERD (gastroesophageal reflux disease)    History of genital warts    HIV (human immunodeficiency virus infection) ([place])    [place]-- DR Carlyle Basques   HIV (human immunodeficiency virus infection) ([place])    Hypercholesterolemia    Hypertension    Intermittent palpitations    mild-- no meds   Legally blind in left eye, as defined in [place]    trauma as child   Renal disorder    Tuberculosis    ? 2005 or 2007   Type 2 diabetes mellitus ([place])    Type II    Uterine fibroid    VIN III (vulvar intraepithelial neoplasia III)    and Verrucoid lesion on the mons   Wears glasses     Family History  Problem Relation Age of Onset   Hypertension Mother    Diabetes Father    Cancer Brother    Breast cancer Cousin     Past Surgical History:  Procedure Laterality Date   CESAREAN SECTION  2001  &  5277   CO2 LASER APPLICATION N/A 82/03/2352   Procedure: CO2 LASER APPLICATION AND WIDE VOCAL EXCSION OF LESION ON MONS PUBIS;  Surgeon: Marti Sleigh, MD;  Location: [facility];  Service: Gynecology;  Laterality: N/A;   CASE CANCELLED    CO2 LASER APPLICATION N/A 05/31/4314   Procedure: CO2 LASER OF VULVAR;  Surgeon: Marti Sleigh, MD;  Location: [facility];  Service:  Gynecology;  Laterality: N/A;   CORNEAL TRANSPLANT Left 2006   failed   D & C HYSTEROSCOPY /  RESECTION FIBROID  2004   DILATATION & CURETTAGE/HYSTEROSCOPY WITH MYOSURE N/A 10/01/2019   Procedure: DILATATION & CURETTAGE/HYSTEROSCOPY WITH MYOSURE and HYDROTHERMAL ABLATION;  Surgeon: Thurnell Lose, MD;  Location: [facility];  Service: Gynecology;  Laterality: N/A;  HTA rep will be here. Confirmed on 09/25/19 CS   HARDWARE REMOVAL Left 04/04/2019   Procedure: EXCISION BONE AND REMOVE DEEP HARDWARE LEFT 5TH METATARSAL;  Surgeon: Newt Minion, MD;  Location: [facility];  Service: Orthopedics;  Laterality: Left;   I & D EXTREMITY Left 06/27/2021   Procedure: IRRIGATION AND DEBRIDEMENT FOOT ULCER;  Surgeon: Jessy Oto, MD;  Location: [facility];  Service: Orthopedics;  Laterality: Left;   KNEE ARTHROSCOPY W/ ACL RECONSTRUCTION Bilateral 2008   ORIF TOE FRACTURE Left 08/02/2017   Procedure: OPEN REDUCTION INTERNAL FIXATION (ORIF) FIFTH METATARSAL (TOE) BASE FRACTURE;  Surgeon: Wylene Simmer, MD;  Location: [facility];  Service: Orthopedics;  Laterality: Left;   VULVECTOMY N/A 01/07/2016   Procedure: WIDE LOCAL EXCISION;  Surgeon: Marti Sleigh, MD;  Location: [facility];  Service: Gynecology;  Laterality: N/A;  mons pubis as site   Social History   Occupational History   Not on file  Tobacco Use   Smoking status: Never   Smokeless tobacco: Never  Vaping Use   Vaping Use: Never used  Substance and Sexual Activity   Alcohol use: No    Alcohol/week: 0.0 standard drinks   Drug use: No   Sexual activity: Yes    Partners: Male    Birth control/protection: None, Condom    Comment: declined condoms 03/07/21

## 2021-07-26 DIAGNOSIS — E08621 Diabetes mellitus due to underlying condition with foot ulcer: Secondary | ICD-10-CM | POA: Diagnosis not present

## 2021-07-26 DIAGNOSIS — E1142 Type 2 diabetes mellitus with diabetic polyneuropathy: Secondary | ICD-10-CM | POA: Diagnosis not present

## 2021-07-26 DIAGNOSIS — M869 Osteomyelitis, unspecified: Secondary | ICD-10-CM | POA: Diagnosis not present

## 2021-07-26 DIAGNOSIS — L98491 Non-pressure chronic ulcer of skin of other sites limited to breakdown of skin: Secondary | ICD-10-CM | POA: Diagnosis not present

## 2021-07-26 DIAGNOSIS — I10 Essential (primary) hypertension: Secondary | ICD-10-CM | POA: Diagnosis not present

## 2021-07-26 DIAGNOSIS — E1165 Type 2 diabetes mellitus with hyperglycemia: Secondary | ICD-10-CM | POA: Diagnosis not present

## 2021-07-26 DIAGNOSIS — E089 Diabetes mellitus due to underlying condition without complications: Secondary | ICD-10-CM | POA: Diagnosis not present

## 2021-07-26 DIAGNOSIS — E1140 Type 2 diabetes mellitus with diabetic neuropathy, unspecified: Secondary | ICD-10-CM | POA: Diagnosis not present

## 2021-07-26 DIAGNOSIS — L97404 Non-pressure chronic ulcer of unspecified heel and midfoot with necrosis of bone: Secondary | ICD-10-CM | POA: Diagnosis not present

## 2021-07-26 DIAGNOSIS — E11621 Type 2 diabetes mellitus with foot ulcer: Secondary | ICD-10-CM | POA: Diagnosis not present

## 2021-07-26 DIAGNOSIS — E7800 Pure hypercholesterolemia, unspecified: Secondary | ICD-10-CM | POA: Diagnosis not present

## 2021-07-26 DIAGNOSIS — N182 Chronic kidney disease, stage 2 (mild): Secondary | ICD-10-CM | POA: Diagnosis not present

## 2021-07-27 LAB — FUNGUS CULTURE WITH STAIN

## 2021-07-27 LAB — FUNGUS CULTURE RESULT

## 2021-07-28 ENCOUNTER — Ambulatory Visit: Payer: Medicare Other | Admitting: Orthopedic Surgery

## 2021-07-28 DIAGNOSIS — E785 Hyperlipidemia, unspecified: Secondary | ICD-10-CM | POA: Diagnosis not present

## 2021-07-28 DIAGNOSIS — N182 Chronic kidney disease, stage 2 (mild): Secondary | ICD-10-CM | POA: Diagnosis not present

## 2021-07-28 DIAGNOSIS — E1122 Type 2 diabetes mellitus with diabetic chronic kidney disease: Secondary | ICD-10-CM | POA: Diagnosis not present

## 2021-07-28 DIAGNOSIS — D509 Iron deficiency anemia, unspecified: Secondary | ICD-10-CM | POA: Diagnosis not present

## 2021-07-28 DIAGNOSIS — N1831 Chronic kidney disease, stage 3a: Secondary | ICD-10-CM | POA: Diagnosis not present

## 2021-07-28 DIAGNOSIS — R509 Fever, unspecified: Secondary | ICD-10-CM | POA: Diagnosis not present

## 2021-07-28 DIAGNOSIS — I129 Hypertensive chronic kidney disease with stage 1 through stage 4 chronic kidney disease, or unspecified chronic kidney disease: Secondary | ICD-10-CM | POA: Diagnosis not present

## 2021-07-29 DIAGNOSIS — E08621 Diabetes mellitus due to underlying condition with foot ulcer: Secondary | ICD-10-CM | POA: Diagnosis not present

## 2021-07-29 DIAGNOSIS — L97404 Non-pressure chronic ulcer of unspecified heel and midfoot with necrosis of bone: Secondary | ICD-10-CM | POA: Diagnosis not present

## 2021-08-01 ENCOUNTER — Encounter: Payer: Self-pay | Admitting: Orthopedic Surgery

## 2021-08-01 ENCOUNTER — Other Ambulatory Visit: Payer: Self-pay

## 2021-08-01 ENCOUNTER — Ambulatory Visit (INDEPENDENT_AMBULATORY_CARE_PROVIDER_SITE_OTHER): Payer: Medicare Other | Admitting: Orthopedic Surgery

## 2021-08-01 VITALS — Ht 69.5 in | Wt 276.0 lb

## 2021-08-01 DIAGNOSIS — L97424 Non-pressure chronic ulcer of left heel and midfoot with necrosis of bone: Secondary | ICD-10-CM

## 2021-08-01 DIAGNOSIS — E08621 Diabetes mellitus due to underlying condition with foot ulcer: Secondary | ICD-10-CM | POA: Diagnosis not present

## 2021-08-01 DIAGNOSIS — S52562M Barton's fracture of left radius, subsequent encounter for open fracture type I or II with nonunion: Secondary | ICD-10-CM

## 2021-08-01 DIAGNOSIS — E11610 Type 2 diabetes mellitus with diabetic neuropathic arthropathy: Secondary | ICD-10-CM

## 2021-08-01 NOTE — Progress Notes (Signed)
Office Visit Note   Patient: Katrina Wolfe           Date of Birth: 26-Oct-1966           MRN: [id number] Visit Date: 08/01/2021              Requested by: Wenda Low, MD [address] [place],  [place] [address] PCP: Wenda Low, MD  Chief Complaint  Patient presents with   Left Foot - Follow-up    06/27/2021 I&D left foot ulcer      HPI: Patient is a 55 year old woman who presents 4 weeks status post debridement ulceration lateral base of the left foot.  Patient is 2 years status post excision of the fifth metatarsal and deep retained hardware secondary to osteomyelitis.  Patient complains of progressive cavovarus deformity and inability to ambulate.  Assessment & Plan: Visit Diagnoses:  1. Diabetic ulcer of left midfoot associated with diabetes mellitus due to underlying condition, with necrosis of bone ([place])   2. Charcot's arthropathy associated with type 2 diabetes mellitus ([place])   3. [place] Barton's fracture of left radius, open type I or II, with nonunion, subsequent encounter     Plan: Discussed with the patient options include continue with bracing and wound care versus tibial calcaneal fusion to provide a plantigrade foot.  Discussed that she has an increased risk of bone not healing incision not healing and potential for a below the knee amputation.  Patient states she understands and would like to proceed with foot salvage intervention.  Follow-Up Instructions: Return in about 2 weeks (around 08/15/2021).   Ortho Exam  Patient is alert, oriented, no adenopathy, well-dressed, normal affect, normal respiratory effort. Examination patient has a strong palpable dorsalis pedis pulse.  She has a granulating ulcer over the base of the fifth metatarsal which is 2 cm in diameter 3 mm deep does not probe to bone or tendon.  There is no cellulitis no tenderness to palpation.  She has an unstable cavovarus deformity with weightbearing her foot rotates  approximately 90 degrees.  Most recent hemoglobin A1c is 8.8.  Imaging: No results found. No images are attached to the encounter.  Labs: Lab Results  Component Value Date   HGBA1C 8.8 (H) 06/24/2021   ESRSEDRATE 74 (H) 06/30/2021   CRP 0.9 06/30/2021   REPTSTATUS 07/03/2021 FINAL 06/27/2021   GRAMSTAIN  06/27/2021    RARE WBC PRESENT,BOTH PMN AND MONONUCLEAR NO ORGANISMS SEEN    CULT  06/27/2021    RARE CORYNEBACTERIUM SPECIES Standardized susceptibility testing for this organism is not available. NO ANAEROBES ISOLATED Performed at [facility], [place] [address] [place], [place] [address]    LABORGA PSEUDOMONAS AERUGINOSA 04/04/2019   LABORGA SERRATIA MARCESCENS 04/04/2019   LABORGA STAPHYLOCOCCUS AUREUS 04/04/2019     Lab Results  Component Value Date   ALBUMIN 2.8 (L) 07/02/2021   ALBUMIN 2.8 (L) 07/01/2021   ALBUMIN 2.7 (L) 06/30/2021    Lab Results  Component Value Date   MG 1.6 (L) 07/04/2021   MG 1.5 (L) 07/03/2021   MG 1.6 (L) 07/02/2021   No results found for: VD25OH  No results found for: PREALBUMIN CBC EXTENDED Latest Ref Rng & Units 07/04/2021 07/03/2021 07/02/2021  WBC 4.0 - 10.5 K/uL 11.2(H) 11.5(H) 11.5(H)  RBC 3.87 - 5.11 MIL/uL 4.08 3.98 3.99  HGB 12.0 - 15.0 g/dL 12.8 12.8 12.8  HCT 36.0 - 46.0 % 38.7 38.2 38.4  PLT 150 - 400 K/uL 353 344  379  NEUTROABS 1.7 - 7.7 K/uL 7.0 7.8(H) 7.5  LYMPHSABS 0.7 - 4.0 K/uL 2.8 2.2 2.4     Body mass index is 40.17 kg/m.  Orders:  No orders of the defined types were placed in this encounter.  No orders of the defined types were placed in this encounter.    Procedures: No procedures performed  Clinical Data: No additional findings.  ROS:  All other systems negative, except as noted in the HPI. Review of Systems  Objective: Vital Signs: Ht 5' 9.5" (1.765 m)   Wt 276 lb (125.2 kg)   BMI 40.17 kg/m   Specialty Comments:  No specialty comments available.  PMFS History: Patient Active  Problem List   Diagnosis Date Noted   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, with necrosis of bone ([place])    Diabetes mellitus type 2, uncontrolled, with complications ([place]) 91/47/8295   Diabetic nephropathy ([place]) 06/26/2021   CKD (chronic kidney disease), stage III ([place]) 06/26/2021   HTN (hypertension) 06/24/2021   HIV (human immunodeficiency virus infection) ([place]) 06/24/2021   DM2 (diabetes mellitus, type 2) ([place]) 06/24/2021   HLD (hyperlipidemia) 06/24/2021   Osteomyelitis of left foot ([place]) 06/24/2021   Elevated LFTs 06/24/2021   Acute-on-chronic kidney injury ([place]) 06/24/2021   Osteomyelitis of foot ([place]) 62/13/0865   Hardware complicating wound infection ([place])    Subacute osteomyelitis, left ankle and foot ([place])    Open displaced fracture of fifth metatarsal bone of left foot    Healthcare maintenance 04/03/2019   HIV disease ([place]) 10/27/2015   DM type 2 (diabetes mellitus, type 2) ([place]) 10/27/2015   Hyperlipidemia 10/27/2015   Essential hypertension 10/27/2015   Severe vulvar dysplasia, histologically confirmed 10/15/2015   Past Medical History:  Diagnosis Date   Abnormal uterine bleeding (AUB)    Arthritis    knees, fingers   Asthma    as child, no problems in 20 yrs   Cancer ([place])    CKD (chronic kidney disease), stage II    Congenital cardiomegaly    per pt has always been told since childhood and siblings    Diabetes mellitus without complication ([place])    GERD (gastroesophageal reflux disease)    History of genital warts    HIV (human immunodeficiency virus infection) ([place])    [place]-- DR Carlyle Basques   HIV (human immunodeficiency virus infection) ([place])    Hypercholesterolemia    Hypertension    Intermittent palpitations    mild-- no meds   Legally blind in left eye, as defined in [place]    trauma as child   Renal disorder    Tuberculosis    ? 2005 or 2007   Type 2 diabetes  mellitus ([place])    Type II    Uterine fibroid    VIN III (vulvar intraepithelial neoplasia III)    and Verrucoid lesion on the mons   Wears glasses     Family History  Problem Relation Age of Onset   Hypertension Mother    Diabetes Father    Cancer Brother    Breast cancer Cousin     Past Surgical History:  Procedure Laterality Date   CESAREAN SECTION  2001  &  7846   CO2 LASER APPLICATION N/A 96/01/9527   Procedure: CO2 LASER APPLICATION AND WIDE VOCAL EXCSION OF LESION ON MONS PUBIS;  Surgeon: Marti Sleigh, MD;  Location: [facility];  Service: Gynecology;  Laterality:  N/A;   CASE CANCELLED    CO2 LASER APPLICATION N/A 2/67/1245   Procedure: CO2 LASER OF VULVAR;  Surgeon: Marti Sleigh, MD;  Location: [facility];  Service: Gynecology;  Laterality: N/A;   CORNEAL TRANSPLANT Left 2006   failed   D & C HYSTEROSCOPY /  RESECTION FIBROID  2004   DILATATION & CURETTAGE/HYSTEROSCOPY WITH MYOSURE N/A 10/01/2019   Procedure: DILATATION & CURETTAGE/HYSTEROSCOPY WITH MYOSURE and HYDROTHERMAL ABLATION;  Surgeon: Thurnell Lose, MD;  Location: [facility];  Service: Gynecology;  Laterality: N/A;  HTA rep will be here. Confirmed on 09/25/19 CS   HARDWARE REMOVAL Left 04/04/2019   Procedure: EXCISION BONE AND REMOVE DEEP HARDWARE LEFT 5TH METATARSAL;  Surgeon: Newt Minion, MD;  Location: [facility];  Service: Orthopedics;  Laterality: Left;   I & D EXTREMITY Left 06/27/2021   Procedure: IRRIGATION AND DEBRIDEMENT FOOT ULCER;  Surgeon: Jessy Oto, MD;  Location: [facility];  Service: Orthopedics;  Laterality: Left;   KNEE ARTHROSCOPY W/ ACL RECONSTRUCTION Bilateral 2008   ORIF TOE FRACTURE Left 08/02/2017   Procedure: OPEN REDUCTION INTERNAL FIXATION (ORIF) FIFTH METATARSAL (TOE) BASE FRACTURE;  Surgeon: Wylene Simmer, MD;  Location: [facility];  Service: Orthopedics;  Laterality: Left;   VULVECTOMY N/A 01/07/2016   Procedure:  WIDE LOCAL EXCISION;  Surgeon: Marti Sleigh, MD;  Location: [facility];  Service: Gynecology;  Laterality: N/A;  mons pubis as site   Social History   Occupational History   Not on file  Tobacco Use   Smoking status: Never   Smokeless tobacco: Never  Vaping Use   Vaping Use: Never used  Substance and Sexual Activity   Alcohol use: No    Alcohol/week: 0.0 standard drinks   Drug use: No   Sexual activity: Yes    Partners: Male    Birth control/protection: None, Condom    Comment: declined condoms 03/07/21

## 2021-08-02 ENCOUNTER — Telehealth: Payer: Self-pay

## 2021-08-02 DIAGNOSIS — L98491 Non-pressure chronic ulcer of skin of other sites limited to breakdown of skin: Secondary | ICD-10-CM | POA: Diagnosis not present

## 2021-08-02 DIAGNOSIS — L97404 Non-pressure chronic ulcer of unspecified heel and midfoot with necrosis of bone: Secondary | ICD-10-CM | POA: Diagnosis not present

## 2021-08-02 DIAGNOSIS — E089 Diabetes mellitus due to underlying condition without complications: Secondary | ICD-10-CM | POA: Diagnosis not present

## 2021-08-02 DIAGNOSIS — E08621 Diabetes mellitus due to underlying condition with foot ulcer: Secondary | ICD-10-CM | POA: Diagnosis not present

## 2021-08-02 DIAGNOSIS — M869 Osteomyelitis, unspecified: Secondary | ICD-10-CM | POA: Diagnosis not present

## 2021-08-02 NOTE — Telephone Encounter (Signed)
Received labs, WBC was 12.9 on 07/28/21.   Beryle Flock, RN

## 2021-08-02 NOTE — Telephone Encounter (Signed)
Patient called, says she had labs done at [facility] and her provider there was concerned about elevated WBC count.   RN called [facility] and requested that most recent labs be faxed to our office for provider to review.   Beryle Flock, RN

## 2021-08-05 ENCOUNTER — Telehealth: Payer: Self-pay | Admitting: Orthopedic Surgery

## 2021-08-05 DIAGNOSIS — H548 Legal blindness, as defined in USA: Secondary | ICD-10-CM | POA: Diagnosis not present

## 2021-08-05 DIAGNOSIS — D631 Anemia in chronic kidney disease: Secondary | ICD-10-CM | POA: Diagnosis not present

## 2021-08-05 DIAGNOSIS — B9689 Other specified bacterial agents as the cause of diseases classified elsewhere: Secondary | ICD-10-CM | POA: Diagnosis not present

## 2021-08-05 DIAGNOSIS — N1832 Chronic kidney disease, stage 3b: Secondary | ICD-10-CM | POA: Diagnosis not present

## 2021-08-05 DIAGNOSIS — Z794 Long term (current) use of insulin: Secondary | ICD-10-CM | POA: Diagnosis not present

## 2021-08-05 DIAGNOSIS — E1122 Type 2 diabetes mellitus with diabetic chronic kidney disease: Secondary | ICD-10-CM | POA: Diagnosis not present

## 2021-08-05 DIAGNOSIS — E1140 Type 2 diabetes mellitus with diabetic neuropathy, unspecified: Secondary | ICD-10-CM | POA: Diagnosis not present

## 2021-08-05 DIAGNOSIS — Z79899 Other long term (current) drug therapy: Secondary | ICD-10-CM | POA: Diagnosis not present

## 2021-08-05 DIAGNOSIS — Z7982 Long term (current) use of aspirin: Secondary | ICD-10-CM | POA: Diagnosis not present

## 2021-08-05 DIAGNOSIS — Z792 Long term (current) use of antibiotics: Secondary | ICD-10-CM | POA: Diagnosis not present

## 2021-08-05 DIAGNOSIS — D509 Iron deficiency anemia, unspecified: Secondary | ICD-10-CM | POA: Diagnosis not present

## 2021-08-05 DIAGNOSIS — E785 Hyperlipidemia, unspecified: Secondary | ICD-10-CM | POA: Diagnosis not present

## 2021-08-05 DIAGNOSIS — M869 Osteomyelitis, unspecified: Secondary | ICD-10-CM | POA: Diagnosis not present

## 2021-08-05 DIAGNOSIS — E1169 Type 2 diabetes mellitus with other specified complication: Secondary | ICD-10-CM | POA: Diagnosis not present

## 2021-08-05 DIAGNOSIS — T8469XA Infection and inflammatory reaction due to internal fixation device of other site, initial encounter: Secondary | ICD-10-CM | POA: Diagnosis not present

## 2021-08-05 DIAGNOSIS — M170 Bilateral primary osteoarthritis of knee: Secondary | ICD-10-CM | POA: Diagnosis not present

## 2021-08-05 DIAGNOSIS — I1310 Hypertensive heart and chronic kidney disease without heart failure, with stage 1 through stage 4 chronic kidney disease, or unspecified chronic kidney disease: Secondary | ICD-10-CM | POA: Diagnosis not present

## 2021-08-05 DIAGNOSIS — Z452 Encounter for adjustment and management of vascular access device: Secondary | ICD-10-CM | POA: Diagnosis not present

## 2021-08-05 DIAGNOSIS — Z9181 History of falling: Secondary | ICD-10-CM | POA: Diagnosis not present

## 2021-08-05 DIAGNOSIS — Z7984 Long term (current) use of oral hypoglycemic drugs: Secondary | ICD-10-CM | POA: Diagnosis not present

## 2021-08-05 NOTE — Telephone Encounter (Signed)
I called and sw PT to advise verbal ok for orders below. To call with any questions.

## 2021-08-05 NOTE — Telephone Encounter (Signed)
[name] called. He is the PT from [facility] working with patient. He would like verbal orders to extend her PT 1x wk for 4 wks. His call back number is [phone]

## 2021-08-06 DIAGNOSIS — L97404 Non-pressure chronic ulcer of unspecified heel and midfoot with necrosis of bone: Secondary | ICD-10-CM | POA: Diagnosis not present

## 2021-08-06 DIAGNOSIS — E08621 Diabetes mellitus due to underlying condition with foot ulcer: Secondary | ICD-10-CM | POA: Diagnosis not present

## 2021-08-08 DIAGNOSIS — E08621 Diabetes mellitus due to underlying condition with foot ulcer: Secondary | ICD-10-CM | POA: Diagnosis not present

## 2021-08-08 DIAGNOSIS — L98491 Non-pressure chronic ulcer of skin of other sites limited to breakdown of skin: Secondary | ICD-10-CM | POA: Diagnosis not present

## 2021-08-08 DIAGNOSIS — L97404 Non-pressure chronic ulcer of unspecified heel and midfoot with necrosis of bone: Secondary | ICD-10-CM | POA: Diagnosis not present

## 2021-08-08 DIAGNOSIS — M869 Osteomyelitis, unspecified: Secondary | ICD-10-CM | POA: Diagnosis not present

## 2021-08-08 DIAGNOSIS — E089 Diabetes mellitus due to underlying condition without complications: Secondary | ICD-10-CM | POA: Diagnosis not present

## 2021-08-09 ENCOUNTER — Telehealth: Payer: Self-pay | Admitting: Pharmacist

## 2021-08-09 NOTE — Telephone Encounter (Signed)
Mary from [facility] called asking about pull PICC orders. Patient completed antibiotics yesterday and has follow-up with you on 8/29. Can they pull PICC today? Thanks!

## 2021-08-09 NOTE — Telephone Encounter (Signed)
ESR/CRP were 61/8 on 8/10 but nothing since then; 8/22 AST/ALT are down but still elevated at 87/187 - might be something ?chronic. I will reach out to [facility] to pull PICC, but let us know if we can help with PO antibiotics since no recent inflammatory markers.

## 2021-08-09 NOTE — Telephone Encounter (Signed)
Mary from [facility] returned call. Gave verbal to pull PICC but asked that a ESR and CRP be drawn before pulling today. Mary verbalized understanding and will have nurse draw labs today. Will keep an eye on lab work to better guide us with PO therapy continuation.  

## 2021-08-10 DIAGNOSIS — L97404 Non-pressure chronic ulcer of unspecified heel and midfoot with necrosis of bone: Secondary | ICD-10-CM | POA: Diagnosis not present

## 2021-08-10 DIAGNOSIS — E08621 Diabetes mellitus due to underlying condition with foot ulcer: Secondary | ICD-10-CM | POA: Diagnosis not present

## 2021-08-10 DIAGNOSIS — M869 Osteomyelitis, unspecified: Secondary | ICD-10-CM | POA: Diagnosis not present

## 2021-08-10 DIAGNOSIS — E089 Diabetes mellitus due to underlying condition without complications: Secondary | ICD-10-CM | POA: Diagnosis not present

## 2021-08-10 DIAGNOSIS — L98491 Non-pressure chronic ulcer of skin of other sites limited to breakdown of skin: Secondary | ICD-10-CM | POA: Diagnosis not present

## 2021-08-12 DIAGNOSIS — Z452 Encounter for adjustment and management of vascular access device: Secondary | ICD-10-CM | POA: Diagnosis not present

## 2021-08-12 DIAGNOSIS — E1169 Type 2 diabetes mellitus with other specified complication: Secondary | ICD-10-CM | POA: Diagnosis not present

## 2021-08-12 DIAGNOSIS — T8469XA Infection and inflammatory reaction due to internal fixation device of other site, initial encounter: Secondary | ICD-10-CM | POA: Diagnosis not present

## 2021-08-12 DIAGNOSIS — N1832 Chronic kidney disease, stage 3b: Secondary | ICD-10-CM | POA: Diagnosis not present

## 2021-08-12 DIAGNOSIS — M170 Bilateral primary osteoarthritis of knee: Secondary | ICD-10-CM | POA: Diagnosis not present

## 2021-08-12 DIAGNOSIS — E1140 Type 2 diabetes mellitus with diabetic neuropathy, unspecified: Secondary | ICD-10-CM | POA: Diagnosis not present

## 2021-08-12 DIAGNOSIS — Z9181 History of falling: Secondary | ICD-10-CM | POA: Diagnosis not present

## 2021-08-12 DIAGNOSIS — B9689 Other specified bacterial agents as the cause of diseases classified elsewhere: Secondary | ICD-10-CM | POA: Diagnosis not present

## 2021-08-12 DIAGNOSIS — H548 Legal blindness, as defined in USA: Secondary | ICD-10-CM | POA: Diagnosis not present

## 2021-08-12 DIAGNOSIS — Z794 Long term (current) use of insulin: Secondary | ICD-10-CM | POA: Diagnosis not present

## 2021-08-12 DIAGNOSIS — E785 Hyperlipidemia, unspecified: Secondary | ICD-10-CM | POA: Diagnosis not present

## 2021-08-12 DIAGNOSIS — Z79899 Other long term (current) drug therapy: Secondary | ICD-10-CM | POA: Diagnosis not present

## 2021-08-12 DIAGNOSIS — Z792 Long term (current) use of antibiotics: Secondary | ICD-10-CM | POA: Diagnosis not present

## 2021-08-12 DIAGNOSIS — D509 Iron deficiency anemia, unspecified: Secondary | ICD-10-CM | POA: Diagnosis not present

## 2021-08-12 DIAGNOSIS — Z7984 Long term (current) use of oral hypoglycemic drugs: Secondary | ICD-10-CM | POA: Diagnosis not present

## 2021-08-12 DIAGNOSIS — D631 Anemia in chronic kidney disease: Secondary | ICD-10-CM | POA: Diagnosis not present

## 2021-08-12 DIAGNOSIS — I1310 Hypertensive heart and chronic kidney disease without heart failure, with stage 1 through stage 4 chronic kidney disease, or unspecified chronic kidney disease: Secondary | ICD-10-CM | POA: Diagnosis not present

## 2021-08-12 DIAGNOSIS — Z7982 Long term (current) use of aspirin: Secondary | ICD-10-CM | POA: Diagnosis not present

## 2021-08-12 DIAGNOSIS — E1122 Type 2 diabetes mellitus with diabetic chronic kidney disease: Secondary | ICD-10-CM | POA: Diagnosis not present

## 2021-08-12 DIAGNOSIS — M869 Osteomyelitis, unspecified: Secondary | ICD-10-CM | POA: Diagnosis not present

## 2021-08-14 NOTE — Progress Notes (Signed)
[facility]  CHIEF COMPLAINT:    Follow up for osteomyelitis  SUBJECTIVE:    Katrina Wolfe is a 55 y.o. female with PMHx as below who presents to the clinic for left foot OM.   She completed 6 weeks of IV therapy with daptomycin and ceftriaxone approximately 08/08/21 and her PICC line was removed.  OPAT labs reviewed 07/27/21 with ESR/CRP 61/8 improved from 07/13/21 when they were 81/12 respectively.  Her LFTs had also improved to 87/187 (previously 218/440 in the hospital).  Further OPAT labs 8/24 with WBC 12.6, Cr 1.25, AST 70, ALT 144, AP and Tbili normal.  No inflammatory markers done.  She saw Dr Sharol Given on 08/01/21 and noted a 2cm granulating ulcer over the base of the 5th metatarsal (2cm diameter, 42mm deep) that did not probe to bone or tendon.  No cellulitis or tenderness to palpation.  They are planning for surgery to hopefully provide a plantigrade foot.    Please see A&P for the details of today's visit and status of the patient's medical problems.   Patient's Medications  New Prescriptions   AMOXICILLIN-CLAVULANATE (AUGMENTIN) 875-125 MG TABLET    Take 1 tablet by mouth 2 (two) times daily for 14 days.   DOXYCYCLINE (VIBRA-TABS) 100 MG TABLET    Take 1 tablet (100 mg total) by mouth 2 (two) times daily for 14 days.  Previous Medications   ALBUTEROL (VENTOLIN HFA) 108 (90 BASE) MCG/ACT INHALER    Inhale 2 puffs into the lungs every 6 (six) hours as needed for wheezing or shortness of breath.   AMLODIPINE (NORVASC) 5 MG TABLET    Take 5 mg by mouth in the morning.   ASPIRIN EC 81 MG TABLET    Take 1 tablet (81 mg total) by mouth 2 (two) times daily.   ASPIRIN EC 81 MG TABLET    Take 81 mg by mouth in the morning. Swallow whole.   ATORVASTATIN (LIPITOR) 10 MG TABLET    Take 10 mg by mouth every morning.   ATROPINE 1 % OPHTHALMIC SOLUTION    Place 1 drop into both eyes 2 (two) times daily.   BICTEGRAVIR-EMTRICITABINE-TENOFOVIR AF (BIKTARVY)  50-200-25 MG TABS TABLET    Take 1 tablet by mouth at bedtime.   BIKTARVY 50-200-25 MG TABS TABLET    TAKE ONE TABLET BY MOUTH every evening   CALCIUM CARBONATE (TUMS - DOSED IN MG ELEMENTAL CALCIUM) 500 MG CHEWABLE TABLET    Chew 1 tablet by mouth as needed for indigestion or heartburn.   CALCIUM CARBONATE-VITAMIN D 600-200 MG-UNIT TABS    1 tablet with a meal   CLONIDINE (CATAPRES) 0.3 MG TABLET    Take 0.3 mg by mouth 2 (two) times daily.   COCONUT OIL 1000 MG CAPS    2 capsules   DIPHENHYDRAMINE (BENADRYL) 25 MG TABLET    Take 25 mg by mouth every 6 (six) hours as needed for allergies (or allergic reactions).   DULAGLUTIDE (TRULICITY) 3 ZV/7.2QA SOPN    Inject 3 mg into the skin every Friday.   ERYTHROMYCIN OPHTHALMIC OINTMENT    SMARTSIG:1 CENTIMETER(S) In Eye(s) 3 Times Daily   FARXIGA 10 MG TABS TABLET    Take 10 mg by mouth in the morning.   FERROUS SULFATE 325 (65 FE) MG EC TABLET    1 tablet   GABAPENTIN (NEURONTIN) 100 MG CAPSULE    Take 100 mg by mouth 3 (three) times daily.  GABAPENTIN (NEURONTIN) 100 MG CAPSULE    Take 100 mg by mouth 4 (four) times daily.   GLIMEPIRIDE (AMARYL) 2 MG TABLET    Take 2 mg by mouth daily.   GLIMEPIRIDE (AMARYL) 2 MG TABLET    Take 2 mg by mouth 2 (two) times daily.   HUMALOG MIX 75/25 KWIKPEN (75-25) 100 UNIT/ML KWIKPEN    Inject 56 Units into the skin 2 (two) times daily.    HYDROCODONE-ACETAMINOPHEN (NORCO/VICODIN) 5-325 MG TABLET    Take 1 tablet by mouth every 6 (six) hours as needed for severe pain.   HYDROCODONE-ACETAMINOPHEN (NORCO/VICODIN) 5-325 MG TABLET    Take 1-2 tablets by mouth every 4 (four) hours as needed for moderate pain (pain score 4-6).   INSULIN LISPRO PROT & LISPRO (HUMALOG 75/25 MIX) (75-25) 100 UNIT/ML KWIKPEN    Inject 60 Units into the skin in the morning and at bedtime.   INSULIN SYRINGE-NEEDLE U-100 (INSULIN SYRINGE 1CC/31GX5/16") 31G X 5/16" 1 ML MISC    1 Units by Does not apply route 2 (two) times daily.   JARDIANCE 10  MG TABS TABLET    Take 10 mg by mouth every morning.   LANCETS (ONETOUCH ULTRASOFT) LANCETS       LISINOPRIL-HYDROCHLOROTHIAZIDE (PRINZIDE,ZESTORETIC) 20-25 MG TABLET    Take 1 tablet by mouth daily.   LISINOPRIL-HYDROCHLOROTHIAZIDE (ZESTORETIC) 20-25 MG TABLET    Take 1 tablet by mouth daily.   METHOCARBAMOL (ROBAXIN) 500 MG TABLET    Take 1 tablet (500 mg total) by mouth every 8 (eight) hours as needed for muscle spasms.   METHOCARBAMOL (ROBAXIN) 500 MG TABLET    Take 500 mg by mouth every 8 (eight) hours as needed for muscle spasms.   NAPROXEN SODIUM (ALEVE) 220 MG TABLET    Take 220-440 mg by mouth 2 (two) times daily as needed (for pain or headaches).   NORETHINDRONE (AYGESTIN) 5 MG TABLET    Take 5 mg by mouth in the morning and at bedtime.   ONE TOUCH ULTRA TEST TEST STRIP       TRAMADOL (ULTRAM) 50 MG TABLET    Take 50 mg by mouth every 4 (four) hours as needed (for pain).   TRULICITY 7.61 PJ/0.9TO SOPN    Inject into the skin. Taking 3.3   VICTOZA 18 MG/3ML SOPN    Inject 1.8 mg into the skin every morning.  Modified Medications   No medications on file  Discontinued Medications   No medications on file      Past Medical History:  Diagnosis Date   Abnormal uterine bleeding (AUB)    Arthritis    knees, fingers   Asthma    as child, no problems in 20 yrs   Cancer ([place])    CKD (chronic kidney disease), stage II    Congenital cardiomegaly    per pt has always been told since childhood and siblings    Diabetes mellitus without complication ([place])    GERD (gastroesophageal reflux disease)    History of genital warts    HIV (human immunodeficiency virus infection) ([place])    DX 1998--  MONITORED BY INFECTIOUS DISEASE-- DR Carlyle Basques   HIV (human immunodeficiency virus infection) ([place])    Hypercholesterolemia    Hypertension    Intermittent palpitations    mild-- no meds   Legally blind in left eye, as defined in [place]    trauma as child   Renal disorder    Tuberculosis     ? 2005 or  2007   Type 2 diabetes mellitus (HCC)    Type II    Uterine fibroid    VIN III (vulvar intraepithelial neoplasia III)    and Verrucoid lesion on the mons   Wears glasses     Social History   Tobacco Use   Smoking status: Never   Smokeless tobacco: Never  Vaping Use   Vaping Use: Never used  Substance Use Topics   Alcohol use: No    Alcohol/week: 0.0 standard drinks   Drug use: No    Family History  Problem Relation Age of Onset   Hypertension Mother    Diabetes Father    Cancer Brother    Breast cancer Cousin     Allergies  Allergen Reactions   Shellfish Allergy Anaphylaxis    All types shellfish   Shellfish Allergy Anaphylaxis and Swelling   Sulfamethoxazole-Trimethoprim Hives and Other (See Comments)   Bactrim [Sulfamethoxazole-Trimethoprim] Hives   Shellfish-Derived Products Other (See Comments)    Review of Systems  Constitutional:  Positive for chills. Negative for fever.  Respiratory: Negative.    Cardiovascular: Negative.   Gastrointestinal: Negative.   Skin:        + wound    OBJECTIVE:    Vitals:   08/15/21 1040  BP: 120/78  Pulse: 89  Temp: 98.7 F (37.1 C)  TempSrc: Oral  SpO2: 98%   There is no height or weight on file to calculate BMI.  Physical Exam Constitutional:      General: She is not in acute distress.    Appearance: Normal appearance.  HENT:     Head: Normocephalic and atraumatic.  Pulmonary:     Effort: Pulmonary effort is normal. No respiratory distress.  Musculoskeletal:     Comments: Left foot cavovarus deformity noted.   Skin:    General: Skin is warm and dry.  Neurological:     General: No focal deficit present.     Mental Status: She is alert and oriented to person, place, and time.     Labs and Microbiology: CBC Latest Ref Rng & Units 07/04/2021 07/03/2021 07/02/2021  WBC 4.0 - 10.5 K/uL 11.2(H) 11.5(H) 11.5(H)  Hemoglobin 12.0 - 15.0 g/dL 12.8 12.8 12.8  Hematocrit 36.0 - 46.0 % 38.7 38.2 38.4   Platelets 150 - 400 K/uL 353 344 379   CMP Latest Ref Rng & Units 07/02/2021 07/01/2021 06/30/2021  Glucose 70 - 99 mg/dL 247(H) 249(H) 246(H)  BUN 6 - 20 mg/dL $Remove'10 10 10  'xsmeQts$ Creatinine 0.44 - 1.00 mg/dL 1.28(H) 1.29(H) 1.29(H)  Sodium 135 - 145 mmol/L 136 134(L) 135  Potassium 3.5 - 5.1 mmol/L 4.1 4.3 4.3  Chloride 98 - 111 mmol/L 106 107 107  CO2 22 - 32 mmol/L 21(L) 20(L) 22  Calcium 8.9 - 10.3 mg/dL 8.8(L) 8.9 8.4(L)  Total Protein 6.5 - 8.1 g/dL 6.9 7.1 6.6  Total Bilirubin 0.3 - 1.2 mg/dL 0.4 0.4 0.8  Alkaline Phos 38 - 126 U/L 39 42 44  AST 15 - 41 U/L 218(H) 211(H) 215(H)  ALT 0 - 44 U/L 440(H) 432(H) 391(H)      Imaging: MRI Left foot 06/25/21 IMPRESSION: Nonunion of a fracture of the base of the fifth metatarsal.   Marrow edema in an ununited fracture fragment at the base of the fifth metatarsal and adjacent 1.5 cm of the fifth metatarsal shaft is worrisome for osteomyelitis. There is an overlying skin wound but no abscess.     ASSESSMENT & PLAN:  Elevated LFTs They have improved since she was admitted.  Unclear etiology for the elevation as they had previously been normal.  Will repeat today to ensure that they are still trending in the right direction.  If downward trend continues will continue to monitor and she has follow up in October with her primary HIV provider, Dr Baxter Flattery.  Osteomyelitis of left foot (HCC) Status post 6 weeks of IV daptomycin and ceftriaxone after operative cultures only yielded Cornynebacterium spp. (Obtained after several days of antibiotics prior to going to the OR).  Her inflammatory markers have improved although remain mildly elevated. Will provide an additional 2 weeks of PO therapy with doxycycline and augmentin to complete ~8 weeks of therapy.  Advised continued orthopedic follow up, wound care, and off loading as much as able.     HIV (human immunodeficiency virus infection) ([place]) Well controlled and on [place].  She has follow up with  Dr Baxter Flattery on 10/03/21.   Orders Placed This Encounter  Procedures   COMPLETE METABOLIC PANEL WITH GFR   Sedimentation rate   C-reactive protein          [facility]ious [facility] 08/15/2021, 10:55 AM

## 2021-08-15 ENCOUNTER — Encounter: Payer: Self-pay | Admitting: Internal Medicine

## 2021-08-15 ENCOUNTER — Other Ambulatory Visit: Payer: Self-pay

## 2021-08-15 ENCOUNTER — Ambulatory Visit (INDEPENDENT_AMBULATORY_CARE_PROVIDER_SITE_OTHER): Payer: Medicare Other | Admitting: Internal Medicine

## 2021-08-15 VITALS — BP 120/78 | HR 89 | Temp 98.7°F

## 2021-08-15 DIAGNOSIS — Z21 Asymptomatic human immunodeficiency virus [HIV] infection status: Secondary | ICD-10-CM | POA: Diagnosis not present

## 2021-08-15 DIAGNOSIS — R7989 Other specified abnormal findings of blood chemistry: Secondary | ICD-10-CM

## 2021-08-15 DIAGNOSIS — M868X7 Other osteomyelitis, ankle and foot: Secondary | ICD-10-CM

## 2021-08-15 MED ORDER — AMOXICILLIN-POT CLAVULANATE 875-125 MG PO TABS: 1.0000 | ORAL_TABLET | Freq: Two times a day (BID) | ORAL | 0 refills | Status: AC

## 2021-08-15 MED ORDER — DOXYCYCLINE HYCLATE 100 MG PO TABS: 100.0000 mg | ORAL_TABLET | Freq: Two times a day (BID) | ORAL | 0 refills | Status: AC

## 2021-08-15 NOTE — Assessment & Plan Note (Signed)
They have improved since she was admitted.  Unclear etiology for the elevation as they had previously been normal.  Will repeat today to ensure that they are still trending in the right direction.  If downward trend continues will continue to monitor and she has follow up in October with her primary HIV provider, Dr Baxter Flattery.

## 2021-08-15 NOTE — Assessment & Plan Note (Signed)
Well controlled and on [place].  She has follow up with Dr Baxter Flattery on 10/03/21.

## 2021-08-15 NOTE — Patient Instructions (Signed)
Thank you for coming to see me today. It was a pleasure seeing you.  To Do: Labs today Take Augmentin and Doxycycline for an additional 2 weeks Follow up with Dr Baxter Flattery and Dr Sharol Given  If you have any questions or concerns, please do not hesitate to call the office at [phone].  Take Care,   Katrina Wolfe

## 2021-08-15 NOTE — Assessment & Plan Note (Signed)
Status post 6 weeks of IV daptomycin and ceftriaxone after operative cultures only yielded Cornynebacterium spp. (Obtained after several days of antibiotics prior to going to the OR).  Her inflammatory markers have improved although remain mildly elevated. Will provide an additional 2 weeks of PO therapy with doxycycline and augmentin to complete ~8 weeks of therapy.  Advised continued orthopedic follow up, wound care, and off loading as much as able.

## 2021-08-16 LAB — COMPLETE METABOLIC PANEL WITH GFR
AG Ratio: 1.1 (calc) (ref 1.0–2.5)
ALT: 136 U/L — ABNORMAL HIGH (ref 6–29)
AST: 71 U/L — ABNORMAL HIGH (ref 10–35)
Albumin: 3.9 g/dL (ref 3.6–5.1)
Alkaline phosphatase (APISO): 49 U/L (ref 37–153)
BUN/Creatinine Ratio: 10 (calc) (ref 6–22)
BUN: 16 mg/dL (ref 7–25)
CO2: 25 mmol/L (ref 20–32)
Calcium: 9.8 mg/dL (ref 8.6–10.4)
Chloride: 105 mmol/L (ref 98–110)
Creat: 1.59 mg/dL — ABNORMAL HIGH (ref 0.50–1.03)
Globulin: 3.7 g/dL (calc) (ref 1.9–3.7)
Glucose, Bld: 71 mg/dL (ref 65–99)
Potassium: 4.4 mmol/L (ref 3.5–5.3)
Sodium: 140 mmol/L (ref 135–146)
Total Bilirubin: 0.8 mg/dL (ref 0.2–1.2)
Total Protein: 7.6 g/dL (ref 6.1–8.1)
eGFR: 38 mL/min/{1.73_m2} — ABNORMAL LOW (ref >=60)

## 2021-08-16 LAB — SEDIMENTATION RATE: Sed Rate: 53 mm/h — ABNORMAL HIGH (ref 0–30)

## 2021-08-16 LAB — C-REACTIVE PROTEIN: CRP: 18.5 mg/L — ABNORMAL HIGH (ref <8.0)

## 2021-08-17 DIAGNOSIS — I10 Essential (primary) hypertension: Secondary | ICD-10-CM | POA: Diagnosis not present

## 2021-08-23 DIAGNOSIS — E1165 Type 2 diabetes mellitus with hyperglycemia: Secondary | ICD-10-CM | POA: Diagnosis not present

## 2021-08-23 DIAGNOSIS — E1142 Type 2 diabetes mellitus with diabetic polyneuropathy: Secondary | ICD-10-CM | POA: Diagnosis not present

## 2021-08-23 DIAGNOSIS — I10 Essential (primary) hypertension: Secondary | ICD-10-CM | POA: Diagnosis not present

## 2021-08-23 DIAGNOSIS — E7800 Pure hypercholesterolemia, unspecified: Secondary | ICD-10-CM | POA: Diagnosis not present

## 2021-08-23 DIAGNOSIS — E11621 Type 2 diabetes mellitus with foot ulcer: Secondary | ICD-10-CM | POA: Diagnosis not present

## 2021-08-23 DIAGNOSIS — N1831 Chronic kidney disease, stage 3a: Secondary | ICD-10-CM | POA: Diagnosis not present

## 2021-08-23 DIAGNOSIS — E1140 Type 2 diabetes mellitus with diabetic neuropathy, unspecified: Secondary | ICD-10-CM | POA: Diagnosis not present

## 2021-08-26 DIAGNOSIS — Z7982 Long term (current) use of aspirin: Secondary | ICD-10-CM | POA: Diagnosis not present

## 2021-08-26 DIAGNOSIS — M869 Osteomyelitis, unspecified: Secondary | ICD-10-CM | POA: Diagnosis not present

## 2021-08-26 DIAGNOSIS — Z79899 Other long term (current) drug therapy: Secondary | ICD-10-CM | POA: Diagnosis not present

## 2021-08-26 DIAGNOSIS — B9689 Other specified bacterial agents as the cause of diseases classified elsewhere: Secondary | ICD-10-CM | POA: Diagnosis not present

## 2021-08-26 DIAGNOSIS — Z794 Long term (current) use of insulin: Secondary | ICD-10-CM | POA: Diagnosis not present

## 2021-08-26 DIAGNOSIS — D631 Anemia in chronic kidney disease: Secondary | ICD-10-CM | POA: Diagnosis not present

## 2021-08-26 DIAGNOSIS — E785 Hyperlipidemia, unspecified: Secondary | ICD-10-CM | POA: Diagnosis not present

## 2021-08-26 DIAGNOSIS — Z7984 Long term (current) use of oral hypoglycemic drugs: Secondary | ICD-10-CM | POA: Diagnosis not present

## 2021-08-26 DIAGNOSIS — D509 Iron deficiency anemia, unspecified: Secondary | ICD-10-CM | POA: Diagnosis not present

## 2021-08-26 DIAGNOSIS — H548 Legal blindness, as defined in USA: Secondary | ICD-10-CM | POA: Diagnosis not present

## 2021-08-26 DIAGNOSIS — T8469XA Infection and inflammatory reaction due to internal fixation device of other site, initial encounter: Secondary | ICD-10-CM | POA: Diagnosis not present

## 2021-08-26 DIAGNOSIS — E1122 Type 2 diabetes mellitus with diabetic chronic kidney disease: Secondary | ICD-10-CM | POA: Diagnosis not present

## 2021-08-26 DIAGNOSIS — Z792 Long term (current) use of antibiotics: Secondary | ICD-10-CM | POA: Diagnosis not present

## 2021-08-26 DIAGNOSIS — M170 Bilateral primary osteoarthritis of knee: Secondary | ICD-10-CM | POA: Diagnosis not present

## 2021-08-26 DIAGNOSIS — I1310 Hypertensive heart and chronic kidney disease without heart failure, with stage 1 through stage 4 chronic kidney disease, or unspecified chronic kidney disease: Secondary | ICD-10-CM | POA: Diagnosis not present

## 2021-08-26 DIAGNOSIS — Z9181 History of falling: Secondary | ICD-10-CM | POA: Diagnosis not present

## 2021-08-26 DIAGNOSIS — N1832 Chronic kidney disease, stage 3b: Secondary | ICD-10-CM | POA: Diagnosis not present

## 2021-08-26 DIAGNOSIS — E1140 Type 2 diabetes mellitus with diabetic neuropathy, unspecified: Secondary | ICD-10-CM | POA: Diagnosis not present

## 2021-08-26 DIAGNOSIS — E1169 Type 2 diabetes mellitus with other specified complication: Secondary | ICD-10-CM | POA: Diagnosis not present

## 2021-08-26 DIAGNOSIS — Z452 Encounter for adjustment and management of vascular access device: Secondary | ICD-10-CM | POA: Diagnosis not present

## 2021-08-30 DIAGNOSIS — E119 Type 2 diabetes mellitus without complications: Secondary | ICD-10-CM | POA: Diagnosis not present

## 2021-08-31 DIAGNOSIS — E785 Hyperlipidemia, unspecified: Secondary | ICD-10-CM | POA: Diagnosis not present

## 2021-08-31 DIAGNOSIS — H548 Legal blindness, as defined in USA: Secondary | ICD-10-CM | POA: Diagnosis not present

## 2021-08-31 DIAGNOSIS — Z9181 History of falling: Secondary | ICD-10-CM | POA: Diagnosis not present

## 2021-08-31 DIAGNOSIS — Z452 Encounter for adjustment and management of vascular access device: Secondary | ICD-10-CM | POA: Diagnosis not present

## 2021-08-31 DIAGNOSIS — N1832 Chronic kidney disease, stage 3b: Secondary | ICD-10-CM | POA: Diagnosis not present

## 2021-08-31 DIAGNOSIS — Z794 Long term (current) use of insulin: Secondary | ICD-10-CM | POA: Diagnosis not present

## 2021-08-31 DIAGNOSIS — Z7982 Long term (current) use of aspirin: Secondary | ICD-10-CM | POA: Diagnosis not present

## 2021-08-31 DIAGNOSIS — E1140 Type 2 diabetes mellitus with diabetic neuropathy, unspecified: Secondary | ICD-10-CM | POA: Diagnosis not present

## 2021-08-31 DIAGNOSIS — Z7984 Long term (current) use of oral hypoglycemic drugs: Secondary | ICD-10-CM | POA: Diagnosis not present

## 2021-08-31 DIAGNOSIS — E1169 Type 2 diabetes mellitus with other specified complication: Secondary | ICD-10-CM | POA: Diagnosis not present

## 2021-08-31 DIAGNOSIS — Z79899 Other long term (current) drug therapy: Secondary | ICD-10-CM | POA: Diagnosis not present

## 2021-08-31 DIAGNOSIS — B9689 Other specified bacterial agents as the cause of diseases classified elsewhere: Secondary | ICD-10-CM | POA: Diagnosis not present

## 2021-08-31 DIAGNOSIS — Z792 Long term (current) use of antibiotics: Secondary | ICD-10-CM | POA: Diagnosis not present

## 2021-08-31 DIAGNOSIS — E1122 Type 2 diabetes mellitus with diabetic chronic kidney disease: Secondary | ICD-10-CM | POA: Diagnosis not present

## 2021-08-31 DIAGNOSIS — I1310 Hypertensive heart and chronic kidney disease without heart failure, with stage 1 through stage 4 chronic kidney disease, or unspecified chronic kidney disease: Secondary | ICD-10-CM | POA: Diagnosis not present

## 2021-08-31 DIAGNOSIS — D631 Anemia in chronic kidney disease: Secondary | ICD-10-CM | POA: Diagnosis not present

## 2021-08-31 DIAGNOSIS — D509 Iron deficiency anemia, unspecified: Secondary | ICD-10-CM | POA: Diagnosis not present

## 2021-08-31 DIAGNOSIS — M170 Bilateral primary osteoarthritis of knee: Secondary | ICD-10-CM | POA: Diagnosis not present

## 2021-08-31 DIAGNOSIS — T8469XA Infection and inflammatory reaction due to internal fixation device of other site, initial encounter: Secondary | ICD-10-CM | POA: Diagnosis not present

## 2021-08-31 DIAGNOSIS — M869 Osteomyelitis, unspecified: Secondary | ICD-10-CM | POA: Diagnosis not present

## 2021-09-05 DIAGNOSIS — L97519 Non-pressure chronic ulcer of other part of right foot with unspecified severity: Secondary | ICD-10-CM | POA: Diagnosis not present

## 2021-09-05 DIAGNOSIS — E1142 Type 2 diabetes mellitus with diabetic polyneuropathy: Secondary | ICD-10-CM | POA: Diagnosis not present

## 2021-09-05 DIAGNOSIS — Z23 Encounter for immunization: Secondary | ICD-10-CM | POA: Diagnosis not present

## 2021-09-05 DIAGNOSIS — H5442A3 Blindness left eye category 3, normal vision right eye: Secondary | ICD-10-CM | POA: Diagnosis not present

## 2021-09-05 DIAGNOSIS — E1165 Type 2 diabetes mellitus with hyperglycemia: Secondary | ICD-10-CM | POA: Diagnosis not present

## 2021-09-05 DIAGNOSIS — E11621 Type 2 diabetes mellitus with foot ulcer: Secondary | ICD-10-CM | POA: Diagnosis not present

## 2021-09-05 DIAGNOSIS — N1831 Chronic kidney disease, stage 3a: Secondary | ICD-10-CM | POA: Diagnosis not present

## 2021-09-05 DIAGNOSIS — I10 Essential (primary) hypertension: Secondary | ICD-10-CM | POA: Diagnosis not present

## 2021-09-29 DIAGNOSIS — E11621 Type 2 diabetes mellitus with foot ulcer: Secondary | ICD-10-CM | POA: Diagnosis not present

## 2021-09-29 DIAGNOSIS — E1140 Type 2 diabetes mellitus with diabetic neuropathy, unspecified: Secondary | ICD-10-CM | POA: Diagnosis not present

## 2021-09-29 DIAGNOSIS — E1165 Type 2 diabetes mellitus with hyperglycemia: Secondary | ICD-10-CM | POA: Diagnosis not present

## 2021-09-29 DIAGNOSIS — I10 Essential (primary) hypertension: Secondary | ICD-10-CM | POA: Diagnosis not present

## 2021-09-29 DIAGNOSIS — E7800 Pure hypercholesterolemia, unspecified: Secondary | ICD-10-CM | POA: Diagnosis not present

## 2021-09-29 DIAGNOSIS — N1831 Chronic kidney disease, stage 3a: Secondary | ICD-10-CM | POA: Diagnosis not present

## 2021-09-29 DIAGNOSIS — E1142 Type 2 diabetes mellitus with diabetic polyneuropathy: Secondary | ICD-10-CM | POA: Diagnosis not present

## 2021-10-03 ENCOUNTER — Ambulatory Visit: Payer: Medicare Other | Admitting: Internal Medicine

## 2021-10-17 DIAGNOSIS — I10 Essential (primary) hypertension: Secondary | ICD-10-CM | POA: Diagnosis not present

## 2021-10-20 DIAGNOSIS — E1140 Type 2 diabetes mellitus with diabetic neuropathy, unspecified: Secondary | ICD-10-CM | POA: Diagnosis not present

## 2021-10-20 DIAGNOSIS — E11621 Type 2 diabetes mellitus with foot ulcer: Secondary | ICD-10-CM | POA: Diagnosis not present

## 2021-10-20 DIAGNOSIS — E1165 Type 2 diabetes mellitus with hyperglycemia: Secondary | ICD-10-CM | POA: Diagnosis not present

## 2021-10-20 DIAGNOSIS — N1831 Chronic kidney disease, stage 3a: Secondary | ICD-10-CM | POA: Diagnosis not present

## 2021-10-20 DIAGNOSIS — I10 Essential (primary) hypertension: Secondary | ICD-10-CM | POA: Diagnosis not present

## 2021-10-20 DIAGNOSIS — E1142 Type 2 diabetes mellitus with diabetic polyneuropathy: Secondary | ICD-10-CM | POA: Diagnosis not present

## 2021-10-20 DIAGNOSIS — E7800 Pure hypercholesterolemia, unspecified: Secondary | ICD-10-CM | POA: Diagnosis not present

## 2021-10-26 ENCOUNTER — Telehealth: Payer: Self-pay | Admitting: Orthopedic Surgery

## 2021-10-26 NOTE — Telephone Encounter (Signed)
Can you call pt and make appt today with Erin and I will open an appt time please?

## 2021-10-26 NOTE — Telephone Encounter (Signed)
Pt called and states she had surgery in July and she was suppose to follow up fpr a second surgery. She states that spot came back in her foot and its starting to have a smell again. I put her down for 11/15. Didn't know if pt should be seen sooner.   CB [phone]

## 2021-10-27 ENCOUNTER — Ambulatory Visit (INDEPENDENT_AMBULATORY_CARE_PROVIDER_SITE_OTHER): Payer: Medicare Other | Admitting: Orthopedic Surgery

## 2021-10-27 DIAGNOSIS — E08621 Diabetes mellitus due to underlying condition with foot ulcer: Secondary | ICD-10-CM | POA: Diagnosis not present

## 2021-10-27 DIAGNOSIS — E11610 Type 2 diabetes mellitus with diabetic neuropathic arthropathy: Secondary | ICD-10-CM

## 2021-10-27 DIAGNOSIS — L97424 Non-pressure chronic ulcer of left heel and midfoot with necrosis of bone: Secondary | ICD-10-CM

## 2021-11-01 ENCOUNTER — Ambulatory Visit: Payer: Medicare Other | Admitting: Orthopedic Surgery

## 2021-11-16 DIAGNOSIS — I10 Essential (primary) hypertension: Secondary | ICD-10-CM | POA: Diagnosis not present

## 2021-11-18 DIAGNOSIS — Z23 Encounter for immunization: Secondary | ICD-10-CM | POA: Diagnosis not present

## 2021-11-18 DIAGNOSIS — T847XXA Infection and inflammatory reaction due to other internal orthopedic prosthetic devices, implants and grafts, initial encounter: Secondary | ICD-10-CM | POA: Diagnosis not present

## 2021-11-18 DIAGNOSIS — H5442A3 Blindness left eye category 3, normal vision right eye: Secondary | ICD-10-CM | POA: Diagnosis not present

## 2021-11-18 DIAGNOSIS — Z794 Long term (current) use of insulin: Secondary | ICD-10-CM | POA: Diagnosis not present

## 2021-11-18 DIAGNOSIS — E7800 Pure hypercholesterolemia, unspecified: Secondary | ICD-10-CM | POA: Diagnosis not present

## 2021-11-18 DIAGNOSIS — I10 Essential (primary) hypertension: Secondary | ICD-10-CM | POA: Diagnosis not present

## 2021-11-18 DIAGNOSIS — E1142 Type 2 diabetes mellitus with diabetic polyneuropathy: Secondary | ICD-10-CM | POA: Diagnosis not present

## 2021-11-18 DIAGNOSIS — N1831 Chronic kidney disease, stage 3a: Secondary | ICD-10-CM | POA: Diagnosis not present

## 2021-11-25 NOTE — Progress Notes (Signed)
Surgical Instructions    Your procedure is scheduled on Wednesday, December 14th, 2022.   Report to [facility] Main Entrance "A" at 06:30 A.M., then check in with the Admitting office.  Call this number if you have problems the morning of surgery:  [phone]   If you have any questions prior to your surgery date call [phone]: Open Monday-Friday 8am-4pm    Remember:  Do not eat after midnight the night before your surgery  You may drink clear liquids until 05:30 the morning of your surgery.   Clear liquids allowed are: Water, Non-Citrus Juices (without pulp), Carbonated Beverages, Clear Tea, Black Coffee ONLY (NO MILK, CREAM OR POWDERED CREAMER of any kind), and Gatorade  Patient Instructions  The day of surgery (if you have diabetes): Drink ONE (1) 12 oz G2 given to you in your pre admission testing appointment by 05:30 the morning of surgery. Drink in one sitting. Do not sip.  This drink was given to you during your hospital  pre-op appointment visit.  Nothing else to drink after completing the  12 oz bottle of G2.         If you have questions, please contact your surgeon's office.     Take these medicines the morning of surgery with A SIP OF WATER:  amLODipine (NORVASC) gabapentin (NEURONTIN)   If needed:  acetaminophen (TYLENOL)  diphenhydrAMINE (BENADRYL) methocarbamol (ROBAXIN)  traMADol (ULTRAM)   As of today, STOP taking any Aspirin (unless otherwise instructed by your surgeon) Aleve, Naproxen, Ibuprofen, Motrin, Advil, Goody's, BC's, all herbal medications, fish oil, and all vitamins.   WHAT DO I DO ABOUT MY DIABETES MEDICATION?   Do not take FARXIGA the day before surgery and the day of surgery Do not take glimepiride (AMARYL) the night before surgery and the morning of surgery Do not take [place] the day of surgery  THE NIGHT BEFORE SURGERY, take 70% of HUMALOG MIX 75/25 (42 units). Do not take HUMALOG MIX 75/25 the morning of  surgery       HOW TO MANAGE YOUR DIABETES BEFORE AND AFTER SURGERY  Why is it important to control my blood sugar before and after surgery? Improving blood sugar levels before and after surgery helps healing and can limit problems. A way of improving blood sugar control is eating a healthy diet by:  Eating less sugar and carbohydrates  Increasing activity/exercise  Talking with your doctor about reaching your blood sugar goals High blood sugars (greater than 180 mg/dL) can raise your risk of infections and slow your recovery, so you will need to focus on controlling your diabetes during the weeks before surgery. Make sure that the doctor who takes care of your diabetes knows about your planned surgery including the date and location.  How do I manage my blood sugar before surgery? Check your blood sugar at least 4 times a day, starting 2 days before surgery, to make sure that the level is not too high or low.  Check your blood sugar the morning of your surgery when you wake up and every 2 hours until you get to the Short Stay unit.  If your blood sugar is less than 70 mg/dL, you will need to treat for low blood sugar: Do not take insulin. Treat a low blood sugar (less than 70 mg/dL) with  cup of clear juice (cranberry or apple), 4 glucose tablets, OR glucose gel. Recheck blood sugar in 15 minutes after treatment (to make sure it is greater than 70 mg/dL).  If your blood sugar is not greater than 70 mg/dL on recheck, call [phone] for further instructions. Report your blood sugar to the short stay nurse when you get to Short Stay.  If you are admitted to the hospital after surgery: Your blood sugar will be checked by the staff and you will probably be given insulin after surgery (instead of oral diabetes medicines) to make sure you have good blood sugar levels. The goal for blood sugar control after surgery is 80-180 mg/dL.    After your COVID test   You are not required to  quarantine however you are required to wear a well-fitting mask when you are out and around people not in your household.  If your mask becomes wet or soiled, replace with a new one.  Wash your hands often with soap and water for 20 seconds or clean your hands with an alcohol-based hand sanitizer that contains at least 60% alcohol.  Do not share personal items.  Notify your provider: if you are in close contact with someone who has COVID  or if you develop a fever of 100.4 or greater, sneezing, cough, sore throat, shortness of breath or body aches.    The day of surgery:  Do not wear jewelry or makeup Do not wear lotions, powders, perfumes, or deodorant. Do not shave 48 hours prior to surgery.   Do not bring valuables to the hospital. DO Not wear nail polish, gel polish, artificial nails, or any other type of covering on natural nails including finger and toenails. If patients have artificial nails, gel coating, etc. that need to be removed by a nail salon, please have this removed prior to surgery or surgery may need to be canceled/delayed if the surgeon/ anesthesia feels like the patient is unable to be adequately monitored.              [facility] is not responsible for any belongings or valuables.  Do NOT Smoke (Tobacco/Vaping)  24 hours prior to your procedure  If you use a CPAP at night, you may bring your mask for your overnight stay.   Contacts, glasses, hearing aids, dentures or partials may not be worn into surgery, please bring cases for these belongings   For patients admitted to the hospital, discharge time will be determined by your treatment team.   Patients discharged the day of surgery will not be allowed to drive home, and someone needs to stay with them for 24 hours.  NO VISITORS WILL BE ALLOWED IN PRE-OP WHERE PATIENTS ARE PREPPED FOR SURGERY.  ONLY 1 SUPPORT PERSON MAY BE PRESENT IN THE WAITING ROOM WHILE YOU ARE IN SURGERY.  IF YOU ARE TO BE ADMITTED, ONCE YOU  ARE IN YOUR ROOM YOU WILL BE ALLOWED TWO (2) VISITORS. 1 (ONE) VISITOR MAY STAY OVERNIGHT BUT MUST ARRIVE TO THE ROOM BY 8pm.  Minor children may have two parents present. Special consideration for safety and communication needs will be reviewed on a case by case basis.  Special instructions:    Oral Hygiene is also important to reduce your risk of infection.  Remember - BRUSH YOUR TEETH THE MORNING OF SURGERY WITH YOUR REGULAR TOOTHPASTE   [facility]- Preparing For Surgery  Before surgery, you can play an important role. Because skin is not sterile, your skin needs to be as free of germs as possible. You can reduce the number of germs on your skin by washing with CHG (chlorahexidine gluconate) Soap before surgery.  CHG is an  antiseptic cleaner which kills germs and bonds with the skin to continue killing germs even after washing.     Please do not use if you have an allergy to CHG or antibacterial soaps. If your skin becomes reddened/irritated stop using the CHG.  Do not shave (including legs and underarms) for at least 48 hours prior to first CHG shower. It is OK to shave your face.  Please follow these instructions carefully.     Shower the NIGHT BEFORE SURGERY and the MORNING OF SURGERY with CHG Soap.   If you chose to wash your hair, wash your hair first as usual with your normal shampoo. After you shampoo, rinse your hair and body thoroughly to remove the shampoo.  Then [employer] and genitals (private parts) with your normal soap and rinse thoroughly to remove soap.  After that Use CHG Soap as you would any other liquid soap. You can apply CHG directly to the skin and wash gently with a scrungie or a clean washcloth.   Apply the CHG Soap to your body ONLY FROM THE NECK DOWN.  Do not use on open wounds or open sores. Avoid contact with your eyes, ears, mouth and genitals (private parts). Wash Face and genitals (private parts)  with your normal soap.   Wash thoroughly, paying special  attention to the area where your surgery will be performed.  Thoroughly rinse your body with warm water from the neck down.  DO NOT shower/wash with your normal soap after using and rinsing off the CHG Soap.  Pat yourself dry with a CLEAN TOWEL.  Wear CLEAN PAJAMAS to bed the night before surgery  Place CLEAN SHEETS on your bed the night before your surgery  DO NOT SLEEP WITH PETS.   Day of Surgery:  Take a shower with CHG soap. Wear Clean/Comfortable clothing the morning of surgery Do not apply any deodorants/lotions.   Remember to brush your teeth WITH YOUR REGULAR TOOTHPASTE.   Please read over the following fact sheets that you were given.

## 2021-11-28 ENCOUNTER — Encounter (HOSPITAL_COMMUNITY): Payer: Self-pay

## 2021-11-28 ENCOUNTER — Other Ambulatory Visit: Payer: Self-pay

## 2021-11-28 ENCOUNTER — Encounter (HOSPITAL_COMMUNITY)
Admission: RE | Admit: 2021-11-28 | Discharge: 2021-11-28 | Disposition: A | Discharge status: Home / Self Care | Payer: Medicare Other | Source: Ambulatory Visit | Attending: Orthopedic Surgery | Admitting: Orthopedic Surgery

## 2021-11-28 VITALS — BP 124/73 | HR 89 | Temp 98.1°F | Resp 18 | Ht 69.5 in | Wt 267.0 lb

## 2021-11-28 DIAGNOSIS — I517 Cardiomegaly: Secondary | ICD-10-CM | POA: Diagnosis not present

## 2021-11-28 DIAGNOSIS — Z01818 Encounter for other preprocedural examination: Secondary | ICD-10-CM | POA: Diagnosis not present

## 2021-11-28 DIAGNOSIS — H5462 Unqualified visual loss, left eye, normal vision right eye: Secondary | ICD-10-CM | POA: Diagnosis not present

## 2021-11-28 DIAGNOSIS — J45909 Unspecified asthma, uncomplicated: Secondary | ICD-10-CM | POA: Insufficient documentation

## 2021-11-28 DIAGNOSIS — S0592XS Unspecified injury of left eye and orbit, sequela: Secondary | ICD-10-CM | POA: Insufficient documentation

## 2021-11-28 DIAGNOSIS — E1122 Type 2 diabetes mellitus with diabetic chronic kidney disease: Secondary | ICD-10-CM | POA: Insufficient documentation

## 2021-11-28 DIAGNOSIS — Z8611 Personal history of tuberculosis: Secondary | ICD-10-CM | POA: Diagnosis not present

## 2021-11-28 DIAGNOSIS — K219 Gastro-esophageal reflux disease without esophagitis: Secondary | ICD-10-CM | POA: Diagnosis not present

## 2021-11-28 DIAGNOSIS — E669 Obesity, unspecified: Secondary | ICD-10-CM | POA: Insufficient documentation

## 2021-11-28 DIAGNOSIS — N183 Chronic kidney disease, stage 3 unspecified: Secondary | ICD-10-CM | POA: Diagnosis not present

## 2021-11-28 DIAGNOSIS — Z21 Asymptomatic human immunodeficiency virus [HIV] infection status: Secondary | ICD-10-CM | POA: Diagnosis not present

## 2021-11-28 DIAGNOSIS — X58XXXS Exposure to other specified factors, sequela: Secondary | ICD-10-CM | POA: Insufficient documentation

## 2021-11-28 DIAGNOSIS — Z20822 Contact with and (suspected) exposure to covid-19: Secondary | ICD-10-CM | POA: Insufficient documentation

## 2021-11-28 DIAGNOSIS — Q6612 Congenital talipes calcaneovarus, left foot: Secondary | ICD-10-CM | POA: Insufficient documentation

## 2021-11-28 DIAGNOSIS — E7800 Pure hypercholesterolemia, unspecified: Secondary | ICD-10-CM | POA: Diagnosis not present

## 2021-11-28 DIAGNOSIS — R002 Palpitations: Secondary | ICD-10-CM | POA: Insufficient documentation

## 2021-11-28 DIAGNOSIS — Z8544 Personal history of malignant neoplasm of other female genital organs: Secondary | ICD-10-CM | POA: Diagnosis not present

## 2021-11-28 DIAGNOSIS — I739 Peripheral vascular disease, unspecified: Secondary | ICD-10-CM | POA: Insufficient documentation

## 2021-11-28 DIAGNOSIS — E1151 Type 2 diabetes mellitus with diabetic peripheral angiopathy without gangrene: Secondary | ICD-10-CM | POA: Insufficient documentation

## 2021-11-28 DIAGNOSIS — I129 Hypertensive chronic kidney disease with stage 1 through stage 4 chronic kidney disease, or unspecified chronic kidney disease: Secondary | ICD-10-CM | POA: Diagnosis not present

## 2021-11-28 DIAGNOSIS — Z6838 Body mass index (BMI) 38.0-38.9, adult: Secondary | ICD-10-CM | POA: Insufficient documentation

## 2021-11-28 HISTORY — DX: Peripheral vascular disease, unspecified: I73.9

## 2021-11-28 LAB — BASIC METABOLIC PANEL
Anion gap: 10 (ref 5–15)
BUN: 14 mg/dL (ref 6–20)
CO2: 24 mmol/L (ref 22–32)
Calcium: 9.3 mg/dL (ref 8.9–10.3)
Chloride: 105 mmol/L (ref 98–111)
Creatinine, Ser: 1.68 mg/dL — ABNORMAL HIGH (ref 0.44–1.00)
GFR, Estimated: 36 mL/min — ABNORMAL LOW (ref >60)
Glucose, Bld: 110 mg/dL — ABNORMAL HIGH (ref 70–99)
Potassium: 4.1 mmol/L (ref 3.5–5.1)
Sodium: 139 mmol/L (ref 135–145)

## 2021-11-28 LAB — CBC
HCT: 48.6 % — ABNORMAL HIGH (ref 36.0–46.0)
Hemoglobin: 15.8 g/dL — ABNORMAL HIGH (ref 12.0–15.0)
MCH: 30 pg (ref 26.0–34.0)
MCHC: 32.5 g/dL (ref 30.0–36.0)
MCV: 92.2 fL (ref 80.0–100.0)
Platelets: 422 10*3/uL — ABNORMAL HIGH (ref 150–400)
RBC: 5.27 MIL/uL — ABNORMAL HIGH (ref 3.87–5.11)
RDW: 15.8 % — ABNORMAL HIGH (ref 11.5–15.5)
WBC: 11.9 10*3/uL — ABNORMAL HIGH (ref 4.0–10.5)
nRBC: 0 % (ref 0.0–0.2)

## 2021-11-28 LAB — SARS CORONAVIRUS 2 (TAT 6-24 HRS): SARS Coronavirus 2: NEGATIVE

## 2021-11-28 LAB — GLUCOSE, CAPILLARY: Glucose-Capillary: 110 mg/dL — ABNORMAL HIGH (ref 70–99)

## 2021-11-28 NOTE — Progress Notes (Signed)
Abnormal Creatinine in PAT.  Dr. Sharol Given office was called and a message was left for the surgical scheduler to call back PAT. Dr. Sharol Given was notified via [facility]

## 2021-11-28 NOTE — Progress Notes (Addendum)
PCP - Wenda Low, MD Cardiologist - denies  PPM/ICD - denies Device Orders - n/a Rep Notified - n/a  Chest x-ray - n/a EKG - 11/28/2021 Stress Test - denies ECHO - denies Cardiac Cath - denies  Sleep Study - denies CPAP - n/a  Fasting Blood Sugar - 76 - 178 Checks Blood Sugar 2 times a day CBG today - 110 A1C - done in PCP office per patient in the beginning of December. Result requested.  Blood Thinner Instructions: n/a  Aspirin Instructions: Aspirin - last dose - 11/25/2021  Patient was instructed: As of today, STOP taking any Aspirin (unless otherwise instructed by your surgeon) Aleve, Naproxen, Ibuprofen, Motrin, Advil, Goody's, BC's, all herbal medications, fish oil, and all vitamins.  ERAS Protcol - yes  PRE-SURGERY G2- yes  COVID TEST- done in PAT on 11/28/2021   Anesthesia review: yes - result requested from PCP office; history of intermittent palpitations; creatinine 1.68  Patient denies shortness of breath, fever, cough and chest pain at PAT appointment   All instructions explained to the patient, with a verbal understanding of the material. Patient agrees to go over the instructions while at home for a better understanding. Patient also instructed to self quarantine after being tested for COVID-19. The opportunity to ask questions was provided.

## 2021-11-29 NOTE — Anesthesia Preprocedure Evaluation (Addendum)
Anesthesia Evaluation  Patient identified by MRN, date of birth, ID band Patient awake    Reviewed: Allergy & Precautions, NPO status , Patient's Chart, lab work & pertinent test results  Airway Mallampati: II  TM Distance: >3 FB Neck ROM: Full    Dental  (+) Chipped, Dental Advisory Given,    Pulmonary asthma ,    Pulmonary exam normal breath sounds clear to auscultation       Cardiovascular hypertension, Pt. on medications + Peripheral Vascular Disease  Normal cardiovascular exam Rhythm:Regular Rate:Normal     Neuro/Psych negative neurological ROS  negative psych ROS   GI/Hepatic Neg liver ROS, GERD  ,  Endo/Other  diabetes, Type 2, Oral Hypoglycemic Agents, Insulin Dependent  Renal/GU Renal InsufficiencyRenal disease  negative genitourinary   Musculoskeletal  (+) Arthritis ,   Abdominal   Peds  Hematology  (+) HIV,   Anesthesia Other Findings History includes never smoker, HIV, HTN, hypercholesterolemia, DM2, cardiomegaly (added to history in 2016; denied prior echo, normal heart size on 02/04/21 CXR), palpitations (2016), GERD, left eye legally blind (due to trauma; left corneal transplant 2006), childhood asthma, TB (2005 or 2007; negative Interferon Gamma Release Assay 12/08/15), CKD (stage III), PVD, vulvar cancer (s/p wide excision high grade squamous VIN III lesion 01/07/16), fibroid/endometrial polyp (s/p D&C/endometrial ablation 10/01/19), left 5th metatarsal fracture (s/p ORIF 08/02/17, s/p removal of hardware due to infection 04/04/19), obesity  Reproductive/Obstetrics                           Anesthesia Physical Anesthesia Plan  ASA: 3  Anesthesia Plan: General and Regional   Post-op Pain Management: Regional block and Tylenol PO (pre-op)   Induction: Intravenous  PONV Risk Score and Plan: 3 and Ondansetron, Dexamethasone and Midazolam  Airway Management Planned:  LMA  Additional Equipment:   Intra-op Plan:   Post-operative Plan: Extubation in OR  Informed Consent: I have reviewed the patients History and Physical, chart, labs and discussed the procedure including the risks, benefits and alternatives for the proposed anesthesia with the patient or authorized representative who has indicated his/her understanding and acceptance.     Dental advisory given  Plan Discussed with: CRNA  Anesthesia Plan Comments: (PAT note written 11/29/2021 by Myra Gianotti, PA-C. )       Anesthesia Quick Evaluation

## 2021-11-29 NOTE — Progress Notes (Signed)
Anesthesia Chart Review:  Case: [id number] Date/Time: 11/30/21 0815   Procedure: LEFT TIBIOCALCANEAL FUSION (Left: Foot)   Anesthesia type: Choice   Pre-op diagnosis: Charcot Cavovarus Collapse Left Foot/Ankle   Location: [facility] OR ROOM 03 / [facility]   Surgeons: Newt Minion, MD       DISCUSSION: Patient is a 55 year old female scheduled for the above procedure. S/p left foot ulcer I&D 06/27/21.  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   11/18/21 from [facility] and 11/28/21 PAT labs reviewed. Renal function and LFTs appear stable. A1c 8.1% on 11/18/21. She will get a CBG on arrival for surgery.  11/28/2021 presurgical COVID-19 test negative.  Anesthesia to evaluate on the day of surgery.    VS: BP 124/73    Pulse 89    Temp 36.7 C (Oral)    Resp 18    Ht 5' 9.5" (1.765 m)    Wt 121.1 kg    SpO2 97%    BMI 38.86 kg/m  LMP documented as 09/2020.   PROVIDERS: Wenda Low, MD is PCP [facility]) - Carlyle Basques, MD is ID. She has been treated for HIV since 1999. Moved to [place] from [place] ~ 2015. Last visit 08/15/21 with Jule Ser, DO. HIV well controlled with Biktarvy. She was s/p 6 weeks of daptomycin and ceftriaxone after operative cultures (in setting of several days of antibiotic prior to OR) yielded only rare Cornynebacterium spp. Two additional weeks of oral antibiotic with doxycycline and augmentin ordered  to complete ~8 weeks of therapy. Biktarvy not felt to be culprit of elevated LFTs. Statin was on hold while undergoing treatment of infection, continue to monitor.  Norlene Duel, MD is ophthalmologist [facility])   LABS: Labs reviewed: Acceptable for surgery.  A1c 8.1%, AST 85, ALT 144 and creatinine 1.68 on 11/18/2021 at [facility] IM. LFT results were similar on 08/15/21 with AST 71 and ALT 136. She had non-reactive Hep B and C screening tests at that time as well as an abdominal US showing cholelithiasis within a decompressed gallbladder. (all labs ordered are listed, but only abnormal results are displayed) Labs Reviewed  GLUCOSE, CAPILLARY - Abnormal; Notable for the following components:      Result Value   Glucose-Capillary 110 (*)    All other components within normal limits  CBC - Abnormal; Notable for the following components:   WBC 11.9 (*)    RBC 5.27 (*)    Hemoglobin 15.8 (*)    HCT 48.6 (*)    RDW 15.8 (*)    Platelets 422 (*)    All other components within normal limits  BASIC METABOLIC PANEL - Abnormal; Notable for the following components:   Glucose, Bld 110 (*)    Creatinine, Ser 1.68 (*)    GFR, Estimated 36 (*)    All other components within normal limits  SARS CORONAVIRUS 2 (TAT 6-24 HRS)  - Hepatitis B Surface Ag and Core total Ab non-reactive and HCV Ab non-reactive on 06/30/21. HIV RNA Quant Not detected  05/19/21.    IMAGES: [place] Abd 06/30/21 (done for elevated LFTs): IMPRESSION: Cholelithiasis within a decompressed gallbladder. No complicating factors are noted.  MRI Left foot 06/25/21: IMPRESSION: - Nonunion of a fracture of the base of the fifth metatarsal. - Marrow edema in an ununited fracture fragment at the base of the fifth metatarsal and adjacent 1.5 cm of the fifth metatarsal shaft is worrisome for osteomyelitis. There is an overlying skin wound but no abscess.  Xray right ribs & chest 06/01/21: FINDINGS: Both lungs are clear. Negative for a pneumothorax.  Heart size is within normal limits. Marker was placed in the right lower chest. Negative for a displaced right rib fracture. IMPRESSION: No acute abnormality.  US Renal 03/18/21: IMPRESSION: Normal renal ultrasound.   EKG: 11/28/21: Normal sinus rhythm Poor R wave progression Cannot rule out Anterior infarct , age undetermined Abnormal ECG No significant change since 04/04/2019 Confirmed by Adrian Prows ([id number]) on 11/29/2021 6:13:29 AM   CV: BLE ABIs 07/02/21: Summary:  - Right: Resting right ankle-brachial index is within normal range. No  evidence of significant right lower extremity arterial disease. The right  toe-brachial index is normal.  - Left: Resting left ankle-brachial index is within normal range. No  evidence of significant left lower extremity arterial disease. The left  toe-brachial index is normal.    Past Medical History:  Diagnosis Date   Abnormal uterine bleeding (AUB)    Arthritis    knees, fingers   Asthma    as child, no problems in 20 yrs   Cancer ([place])    CKD (chronic kidney disease), stage II    Congenital cardiomegaly    per pt has always been told since childhood and siblings    Diabetes mellitus without complication (HCC)    GERD (gastroesophageal reflux disease)    History of genital warts    HIV (human immunodeficiency virus infection) ([place])    DX 1998--  MONITORED BY INFECTIOUS DISEASE-- DR Carlyle Basques   HIV (human immunodeficiency virus infection) ([place])    Hypercholesterolemia    Hypertension    Intermittent palpitations    mild-- no meds   Legally blind in left eye, as defined in [place]    trauma as child   Peripheral vascular disease ([place])    Renal disorder    Tuberculosis    ? 2005 or 2007   Type 2 diabetes mellitus ([place])    Type II    Uterine fibroid    VIN III (vulvar intraepithelial neoplasia III)    and Verrucoid lesion on the mons   Wears glasses     Past Surgical History:  Procedure Laterality Date   CESAREAN  SECTION  2001  &  2119   CO2 LASER APPLICATION N/A 41/74/0814   Procedure: CO2 LASER APPLICATION AND WIDE VOCAL EXCSION OF LESION ON MONS PUBIS;  Surgeon: Marti Sleigh, MD;  Location: [place];  Service: Gynecology;  Laterality: N/A;   CASE CANCELLED    CO2 LASER APPLICATION N/A 48/18/5631   Procedure: CO2 LASER OF VULVAR;  Surgeon: Marti Sleigh, MD;  Location: [facility];  Service: Gynecology;  Laterality: N/A;   CORNEAL TRANSPLANT Left 12/18/2004   failed   D & C HYSTEROSCOPY /  RESECTION FIBROID  12/18/2002   DILATATION & CURETTAGE/HYSTEROSCOPY WITH MYOSURE N/A 10/01/2019   Procedure: DILATATION & CURETTAGE/HYSTEROSCOPY WITH MYOSURE and HYDROTHERMAL ABLATION;  Surgeon: Thurnell Lose, MD;  Location: [facility];  Service: Gynecology;  Laterality: N/A;  HTA  rep will be here. Confirmed on 09/25/19 CS   EYE SURGERY     HARDWARE REMOVAL Left 04/04/2019   Procedure: EXCISION BONE AND REMOVE DEEP HARDWARE LEFT 5TH METATARSAL;  Surgeon: Newt Minion, MD;  Location: [place];  Service: Orthopedics;  Laterality: Left;   I & D EXTREMITY Left 06/27/2021   Procedure: IRRIGATION AND DEBRIDEMENT FOOT ULCER;  Surgeon: Jessy Oto, MD;  Location: [facility];  Service: Orthopedics;  Laterality: Left;   KNEE ARTHROSCOPY W/ ACL RECONSTRUCTION Bilateral 12/18/2006   ORIF TOE FRACTURE Left 08/02/2017   Procedure: OPEN REDUCTION INTERNAL FIXATION (ORIF) FIFTH METATARSAL (TOE) BASE FRACTURE;  Surgeon: Wylene Simmer, MD;  Location: [facility];  Service: Orthopedics;  Laterality: Left;   VULVECTOMY N/A 01/07/2016   Procedure: WIDE LOCAL EXCISION;  Surgeon: Marti Sleigh, MD;  Location: [facility];  Service: Gynecology;  Laterality: N/A;  mons pubis as site    MEDICATIONS:  acetaminophen (TYLENOL) 500 MG tablet   amLODipine (NORVASC) 5 MG tablet   aspirin EC 81 MG tablet   BIKTARVY 50-200-25 MG TABS tablet    calcium carbonate (TUMS - DOSED IN MG ELEMENTAL CALCIUM) 500 MG chewable tablet   Calcium Carbonate-Vitamin D 600-200 MG-UNIT TABS   diphenhydrAMINE (BENADRYL) 25 MG tablet   FARXIGA 10 MG TABS tablet   ferrous sulfate 325 (65 FE) MG EC tablet   gabapentin (NEURONTIN) 100 MG capsule   glimepiride (AMARYL) 2 MG tablet   HUMALOG KWIKPEN 100 UNIT/ML KwikPen   HUMALOG MIX 75/25 KWIKPEN (75-25) 100 UNIT/ML Kwikpen   HYDROcodone-acetaminophen (NORCO/VICODIN) 5-325 MG tablet   HYDROcodone-acetaminophen (NORCO/VICODIN) 5-325 MG tablet   Insulin Syringe-Needle U-100 (INSULIN SYRINGE 1CC/31GX5/16") 31G X 5/16" 1 ML MISC   Lancets (ONETOUCH ULTRASOFT) lancets   lisinopril-hydrochlorothiazide (ZESTORETIC) 20-25 MG tablet   methocarbamol (ROBAXIN) 500 MG tablet   norethindrone (AYGESTIN) 5 MG tablet   ONE TOUCH ULTRA TEST test strip   traMADol (ULTRAM) 50 MG tablet   No current facility-administered medications for this encounter.    Myra Gianotti, PA-C Surgical Short Stay/Anesthesiology [facility] Phone [phone] [facility] Phone [phone] 11/29/2021 10:09 AM

## 2021-11-30 ENCOUNTER — Observation Stay (HOSPITAL_COMMUNITY)
Admission: RE | Admit: 2021-11-30 | Discharge: 2021-12-01 | Disposition: A | Discharge status: Home / Self Care | Payer: Medicare Other | Source: Ambulatory Visit | Attending: Orthopedic Surgery | Admitting: Orthopedic Surgery

## 2021-11-30 ENCOUNTER — Encounter (HOSPITAL_COMMUNITY): Payer: Self-pay | Admitting: Orthopedic Surgery

## 2021-11-30 ENCOUNTER — Other Ambulatory Visit: Payer: Self-pay

## 2021-11-30 ENCOUNTER — Encounter (HOSPITAL_COMMUNITY)
Admission: RE | Disposition: A | Discharge status: Home / Self Care | Payer: Self-pay | Source: Ambulatory Visit | Attending: Orthopedic Surgery

## 2021-11-30 ENCOUNTER — Ambulatory Visit (HOSPITAL_COMMUNITY): Payer: Medicare Other | Admitting: Vascular Surgery

## 2021-11-30 ENCOUNTER — Ambulatory Visit (HOSPITAL_COMMUNITY): Payer: Medicare Other | Admitting: Anesthesiology

## 2021-11-30 DIAGNOSIS — Z79899 Other long term (current) drug therapy: Secondary | ICD-10-CM | POA: Insufficient documentation

## 2021-11-30 DIAGNOSIS — I129 Hypertensive chronic kidney disease with stage 1 through stage 4 chronic kidney disease, or unspecified chronic kidney disease: Secondary | ICD-10-CM | POA: Insufficient documentation

## 2021-11-30 DIAGNOSIS — M14672 Charcot's joint, left ankle and foot: Secondary | ICD-10-CM | POA: Diagnosis not present

## 2021-11-30 DIAGNOSIS — N182 Chronic kidney disease, stage 2 (mild): Secondary | ICD-10-CM | POA: Diagnosis not present

## 2021-11-30 DIAGNOSIS — Z794 Long term (current) use of insulin: Secondary | ICD-10-CM | POA: Insufficient documentation

## 2021-11-30 DIAGNOSIS — M216X2 Other acquired deformities of left foot: Secondary | ICD-10-CM | POA: Diagnosis not present

## 2021-11-30 DIAGNOSIS — E1122 Type 2 diabetes mellitus with diabetic chronic kidney disease: Secondary | ICD-10-CM | POA: Insufficient documentation

## 2021-11-30 DIAGNOSIS — Z981 Arthrodesis status: Secondary | ICD-10-CM

## 2021-11-30 DIAGNOSIS — B20 Human immunodeficiency virus [HIV] disease: Secondary | ICD-10-CM | POA: Diagnosis not present

## 2021-11-30 DIAGNOSIS — G8918 Other acute postprocedural pain: Secondary | ICD-10-CM | POA: Diagnosis not present

## 2021-11-30 DIAGNOSIS — Z7982 Long term (current) use of aspirin: Secondary | ICD-10-CM | POA: Insufficient documentation

## 2021-11-30 DIAGNOSIS — E11610 Type 2 diabetes mellitus with diabetic neuropathic arthropathy: Secondary | ICD-10-CM | POA: Diagnosis not present

## 2021-11-30 DIAGNOSIS — J45909 Unspecified asthma, uncomplicated: Secondary | ICD-10-CM | POA: Diagnosis not present

## 2021-11-30 DIAGNOSIS — Z7984 Long term (current) use of oral hypoglycemic drugs: Secondary | ICD-10-CM | POA: Diagnosis not present

## 2021-11-30 HISTORY — PX: FOOT ARTHRODESIS: SHX1655

## 2021-11-30 LAB — GLUCOSE, CAPILLARY
Glucose-Capillary: 136 mg/dL — ABNORMAL HIGH (ref 70–99)
Glucose-Capillary: 110 mg/dL — ABNORMAL HIGH (ref 70–99)
Glucose-Capillary: 148 mg/dL — ABNORMAL HIGH (ref 70–99)
Glucose-Capillary: 246 mg/dL — ABNORMAL HIGH (ref 70–99)
Glucose-Capillary: 253 mg/dL — ABNORMAL HIGH (ref 70–99)

## 2021-11-30 LAB — POCT PREGNANCY, URINE: Preg Test, Ur: NEGATIVE

## 2021-11-30 LAB — HEMOGLOBIN A1C
Hgb A1c MFr Bld: 8.2 % — ABNORMAL HIGH (ref 4.8–5.6)
Mean Plasma Glucose: 188.64 mg/dL

## 2021-11-30 SURGERY — FUSION, JOINT, FOOT
Anesthesia: Regional | Site: Foot | Laterality: Left

## 2021-11-30 MED ORDER — ONDANSETRON HCL 4 MG/2ML IJ SOLN: 4.0000 mg | Freq: Four times a day (QID) | INTRAMUSCULAR | Status: DC | PRN

## 2021-11-30 MED ORDER — METOCLOPRAMIDE HCL 5 MG/ML IJ SOLN: 5.0000 mg | Freq: Three times a day (TID) | INTRAMUSCULAR | Status: DC | PRN

## 2021-11-30 MED ORDER — LACTATED RINGERS IV SOLN: INTRAVENOUS | Status: DC

## 2021-11-30 MED ORDER — POLYETHYLENE GLYCOL 3350 17 G PO PACK: 17.0000 g | PACK | Freq: Every day | ORAL | Status: DC | PRN

## 2021-11-30 MED ORDER — ORAL CARE MOUTH RINSE: 15.0000 mL | Freq: Once | OROMUCOSAL | Status: AC

## 2021-11-30 MED ORDER — ROPIVACAINE HCL 5 MG/ML IJ SOLN
INTRAMUSCULAR | Status: DC | PRN
  Administered 2021-11-30: 30 mL via PERINEURAL
  Administered 2021-11-30: 20 mL via PERINEURAL

## 2021-11-30 MED ORDER — METHOCARBAMOL 1000 MG/10ML IJ SOLN
500.0000 mg | Freq: Four times a day (QID) | INTRAVENOUS | Status: DC | PRN
  Filled 2021-11-30: qty 5

## 2021-11-30 MED ORDER — BISACODYL 10 MG RE SUPP: 10.0000 mg | Freq: Every day | RECTAL | Status: DC | PRN

## 2021-11-30 MED ORDER — PROPOFOL 10 MG/ML IV BOLUS
INTRAVENOUS | Status: DC | PRN
  Administered 2021-11-30: 200 mg via INTRAVENOUS

## 2021-11-30 MED ORDER — DOCUSATE SODIUM 100 MG PO CAPS
100.0000 mg | ORAL_CAPSULE | Freq: Two times a day (BID) | ORAL | Status: DC
  Administered 2021-11-30 – 2021-12-01 (×3): 100 mg via ORAL
  Filled 2021-11-30 (×3): qty 1

## 2021-11-30 MED ORDER — ACETAMINOPHEN 500 MG PO TABS
1000.0000 mg | ORAL_TABLET | Freq: Once | ORAL | Status: AC
  Administered 2021-11-30: 07:00:00 1000 mg via ORAL
  Filled 2021-11-30: qty 2

## 2021-11-30 MED ORDER — ONDANSETRON HCL 4 MG/2ML IJ SOLN
INTRAMUSCULAR | Status: AC
  Filled 2021-11-30: qty 2

## 2021-11-30 MED ORDER — METOCLOPRAMIDE HCL 5 MG PO TABS: 5.0000 mg | ORAL_TABLET | Freq: Three times a day (TID) | ORAL | Status: DC | PRN

## 2021-11-30 MED ORDER — DEXAMETHASONE SODIUM PHOSPHATE 10 MG/ML IJ SOLN
INTRAMUSCULAR | Status: AC
  Filled 2021-11-30: qty 1

## 2021-11-30 MED ORDER — LIDOCAINE 2% (20 MG/ML) 5 ML SYRINGE
INTRAMUSCULAR | Status: AC
  Filled 2021-11-30: qty 5

## 2021-11-30 MED ORDER — HYDROMORPHONE HCL 1 MG/ML IJ SOLN
0.5000 mg | INTRAMUSCULAR | Status: DC | PRN
  Administered 2021-11-30: 15:00:00 1 mg via INTRAVENOUS
  Filled 2021-11-30: qty 1

## 2021-11-30 MED ORDER — MIDAZOLAM HCL 2 MG/2ML IJ SOLN
INTRAMUSCULAR | Status: DC | PRN
  Administered 2021-11-30 (×2): 1 mg via INTRAVENOUS

## 2021-11-30 MED ORDER — LISINOPRIL-HYDROCHLOROTHIAZIDE 20-25 MG PO TABS: 1.0000 | ORAL_TABLET | Freq: Every day | ORAL | Status: DC

## 2021-11-30 MED ORDER — PROPOFOL 10 MG/ML IV BOLUS
INTRAVENOUS | Status: AC
  Filled 2021-11-30: qty 20

## 2021-11-30 MED ORDER — AMLODIPINE BESYLATE 5 MG PO TABS
5.0000 mg | ORAL_TABLET | Freq: Every morning | ORAL | Status: DC
  Administered 2021-12-01: 5 mg via ORAL
  Filled 2021-11-30: qty 1

## 2021-11-30 MED ORDER — LISINOPRIL 20 MG PO TABS
20.0000 mg | ORAL_TABLET | Freq: Every day | ORAL | Status: DC
  Administered 2021-11-30 – 2021-12-01 (×2): 20 mg via ORAL
  Filled 2021-11-30 (×3): qty 1

## 2021-11-30 MED ORDER — MIDAZOLAM HCL 2 MG/2ML IJ SOLN
INTRAMUSCULAR | Status: AC
  Filled 2021-11-30: qty 2

## 2021-11-30 MED ORDER — GLIMEPIRIDE 2 MG PO TABS
2.0000 mg | ORAL_TABLET | Freq: Every day | ORAL | Status: DC
  Filled 2021-11-30 (×3): qty 1

## 2021-11-30 MED ORDER — GABAPENTIN 100 MG PO CAPS
100.0000 mg | ORAL_CAPSULE | Freq: Four times a day (QID) | ORAL | Status: DC
  Administered 2021-11-30 – 2021-12-01 (×3): 100 mg via ORAL
  Filled 2021-11-30 (×5): qty 1

## 2021-11-30 MED ORDER — CEFAZOLIN SODIUM-DEXTROSE 2-4 GM/100ML-% IV SOLN
2.0000 g | Freq: Four times a day (QID) | INTRAVENOUS | Status: AC
  Administered 2021-11-30 – 2021-12-01 (×3): 2 g via INTRAVENOUS
  Filled 2021-11-30 (×3): qty 100

## 2021-11-30 MED ORDER — ONDANSETRON HCL 4 MG PO TABS: 4.0000 mg | ORAL_TABLET | Freq: Four times a day (QID) | ORAL | Status: DC | PRN

## 2021-11-30 MED ORDER — CEFAZOLIN IN SODIUM CHLORIDE 3-0.9 GM/100ML-% IV SOLN
3.0000 g | INTRAVENOUS | Status: DC
  Filled 2021-11-30: qty 100

## 2021-11-30 MED ORDER — OXYCODONE HCL 5 MG PO TABS
5.0000 mg | ORAL_TABLET | ORAL | Status: DC | PRN
  Administered 2021-11-30 – 2021-12-01 (×4): 10 mg via ORAL
  Filled 2021-11-30 (×5): qty 2

## 2021-11-30 MED ORDER — CHLORHEXIDINE GLUCONATE 0.12 % MT SOLN
15.0000 mL | Freq: Once | OROMUCOSAL | Status: AC
  Administered 2021-11-30: 07:00:00 15 mL via OROMUCOSAL
  Filled 2021-11-30: qty 15

## 2021-11-30 MED ORDER — LIDOCAINE 2% (20 MG/ML) 5 ML SYRINGE
INTRAMUSCULAR | Status: DC | PRN
  Administered 2021-11-30: 20 mg via INTRAVENOUS

## 2021-11-30 MED ORDER — INSULIN ASPART 100 UNIT/ML IJ SOLN
4.0000 [IU] | Freq: Three times a day (TID) | INTRAMUSCULAR | Status: DC
  Administered 2021-11-30 – 2021-12-01 (×4): 4 [IU] via SUBCUTANEOUS

## 2021-11-30 MED ORDER — HYDROCHLOROTHIAZIDE 25 MG PO TABS
25.0000 mg | ORAL_TABLET | Freq: Every day | ORAL | Status: DC
  Administered 2021-11-30 – 2021-12-01 (×2): 25 mg via ORAL
  Filled 2021-11-30 (×2): qty 1

## 2021-11-30 MED ORDER — METHOCARBAMOL 500 MG PO TABS
500.0000 mg | ORAL_TABLET | Freq: Four times a day (QID) | ORAL | Status: DC | PRN
  Administered 2021-11-30 – 2021-12-01 (×2): 500 mg via ORAL
  Filled 2021-11-30 (×2): qty 1

## 2021-11-30 MED ORDER — SODIUM CHLORIDE 0.9 % IV SOLN: INTRAVENOUS | Status: DC

## 2021-11-30 MED ORDER — FENTANYL CITRATE (PF) 100 MCG/2ML IJ SOLN: 25.0000 ug | INTRAMUSCULAR | Status: DC | PRN

## 2021-11-30 MED ORDER — DAPAGLIFLOZIN PROPANEDIOL 10 MG PO TABS
10.0000 mg | ORAL_TABLET | Freq: Every morning | ORAL | Status: DC
  Administered 2021-12-01: 10 mg via ORAL
  Filled 2021-11-30: qty 1

## 2021-11-30 MED ORDER — NORETHINDRONE ACETATE 5 MG PO TABS
5.0000 mg | ORAL_TABLET | Freq: Every morning | ORAL | Status: DC
  Administered 2021-12-01: 5 mg via ORAL
  Filled 2021-11-30: qty 1

## 2021-11-30 MED ORDER — DEXAMETHASONE SODIUM PHOSPHATE 10 MG/ML IJ SOLN
INTRAMUSCULAR | Status: DC | PRN
  Administered 2021-11-30: 4 mg via INTRAVENOUS

## 2021-11-30 MED ORDER — GLIMEPIRIDE 4 MG PO TABS
4.0000 mg | ORAL_TABLET | Freq: Every day | ORAL | Status: DC
  Administered 2021-12-01: 4 mg via ORAL
  Filled 2021-11-30: qty 1

## 2021-11-30 MED ORDER — ONDANSETRON HCL 4 MG/2ML IJ SOLN
INTRAMUSCULAR | Status: DC | PRN
  Administered 2021-11-30: 4 mg via INTRAVENOUS

## 2021-11-30 MED ORDER — ACETAMINOPHEN 325 MG PO TABS
325.0000 mg | ORAL_TABLET | Freq: Four times a day (QID) | ORAL | Status: DC | PRN
  Administered 2021-12-01: 650 mg via ORAL
  Filled 2021-11-30: qty 2

## 2021-11-30 MED ORDER — FENTANYL CITRATE (PF) 250 MCG/5ML IJ SOLN
INTRAMUSCULAR | Status: AC
  Filled 2021-11-30: qty 5

## 2021-11-30 MED ORDER — OXYCODONE HCL 5 MG PO TABS
10.0000 mg | ORAL_TABLET | ORAL | Status: DC | PRN
  Administered 2021-12-01: 10 mg via ORAL

## 2021-11-30 MED ORDER — FENTANYL CITRATE (PF) 100 MCG/2ML IJ SOLN
INTRAMUSCULAR | Status: DC | PRN
  Administered 2021-11-30: 50 ug via INTRAVENOUS

## 2021-11-30 MED ORDER — DEXAMETHASONE SODIUM PHOSPHATE 10 MG/ML IJ SOLN
INTRAMUSCULAR | Status: DC | PRN
  Administered 2021-11-30 (×2): 5 mg

## 2021-11-30 MED ORDER — 0.9 % SODIUM CHLORIDE (POUR BTL) OPTIME
TOPICAL | Status: DC | PRN
  Administered 2021-11-30: 09:00:00 1000 mL

## 2021-11-30 MED ORDER — INSULIN ASPART 100 UNIT/ML IJ SOLN
0.0000 [IU] | Freq: Three times a day (TID) | INTRAMUSCULAR | Status: DC
  Administered 2021-11-30: 16:00:00 2 [IU] via SUBCUTANEOUS
  Administered 2021-12-01 (×2): 11 [IU] via SUBCUTANEOUS

## 2021-11-30 MED ORDER — BICTEGRAVIR-EMTRICITAB-TENOFOV 50-200-25 MG PO TABS
1.0000 | ORAL_TABLET | Freq: Every evening | ORAL | Status: DC
  Administered 2021-11-30: 21:00:00 1 via ORAL
  Filled 2021-11-30 (×3): qty 1

## 2021-11-30 SURGICAL SUPPLY — 51 items
BAG COUNTER SPONGE SURGICOUNT (BAG) ×2 IMPLANT
BIT DRILL CANNULATED 4.6 (BIT) ×1 IMPLANT
BIT DRILL LONG 3.1X160 (DRILL) IMPLANT
BIT DRILL SOLID LONG 2.8X160 (DRILL) IMPLANT
BLADE SAW SGTL HD 18.5X60.5X1. (BLADE) ×2 IMPLANT
BLADE SURG 10 STRL SS (BLADE) IMPLANT
BNDG COHESIVE 4X5 TAN ST LF (GAUZE/BANDAGES/DRESSINGS) ×1 IMPLANT
BNDG GAUZE ELAST 4 BULKY (GAUZE/BANDAGES/DRESSINGS) ×2 IMPLANT
BUR SURG 4X8 MED (BURR) IMPLANT
BURR SURG 4X8 MED (BURR) ×2
COVER MAYO STAND STRL (DRAPES) IMPLANT
COVER SURGICAL LIGHT HANDLE (MISCELLANEOUS) ×4 IMPLANT
DRAPE INCISE IOBAN 66X45 STRL (DRAPES) ×2 IMPLANT
DRAPE OEC MINIVIEW 54X84 (DRAPES) IMPLANT
DRAPE U-SHAPE 47X51 STRL (DRAPES) ×2 IMPLANT
DRILL LONG 3.1X160 (DRILL) ×2
DRILL SOLID LONG 2.8X160 (DRILL) ×2
DRSG ADAPTIC 3X8 NADH LF (GAUZE/BANDAGES/DRESSINGS) ×1 IMPLANT
DRSG PAD ABDOMINAL 8X10 ST (GAUZE/BANDAGES/DRESSINGS) ×2 IMPLANT
ELECT REM PT RETURN 9FT ADLT (ELECTROSURGICAL) ×2
ELECTRODE REM PT RTRN 9FT ADLT (ELECTROSURGICAL) ×1 IMPLANT
GAUZE SPONGE 4X4 12PLY STRL (GAUZE/BANDAGES/DRESSINGS) ×1 IMPLANT
GLOVE SURG ORTHO LTX SZ9 (GLOVE) ×2 IMPLANT
GLOVE SURG UNDER POLY LF SZ9 (GLOVE) ×2 IMPLANT
GOWN STRL REUS W/ TWL XL LVL3 (GOWN DISPOSABLE) ×3 IMPLANT
GOWN STRL REUS W/TWL XL LVL3 (GOWN DISPOSABLE) ×3
IV SODIUM CHL 0.9% 500ML (IV SOLUTION) ×2 IMPLANT
K-WIRE SINGLE TROCAR 2.3X230 (WIRE) ×2
KIT BASIN OR (CUSTOM PROCEDURE TRAY) ×2 IMPLANT
KIT TURNOVER KIT B (KITS) ×2 IMPLANT
KWIRE SINGLE TROCAR 2.3X230 (WIRE) IMPLANT
MANIFOLD NEPTUNE II (INSTRUMENTS) ×2 IMPLANT
PACK ORTHO EXTREMITY (CUSTOM PROCEDURE TRAY) ×2 IMPLANT
PAD ARMBOARD 7.5X6 YLW CONV (MISCELLANEOUS) ×4 IMPLANT
PAD CAST 4YDX4 CTTN HI CHSV (CAST SUPPLIES) ×1 IMPLANT
PADDING CAST COTTON 4X4 STRL (CAST SUPPLIES) ×1
PLATE ANT TTC FLAT LT (Plate) ×1 IMPLANT
PUTTY DBM STAGRAFT PLUS 2CC (Putty) ×1 IMPLANT
SCREW CANN MED THREAD 7.0 X 80 (Screw) ×1 IMPLANT
SCREW HEADLESS CANN 7.0X90 SHT (Screw) ×1 IMPLANT
SCREW NLOCK PLATE SB 4.5X38 (Screw) ×1 IMPLANT
SCREW NONLOCK 4.5X28 (Screw) ×2 IMPLANT
SCREW NONLOCK PLATE R3 4.2X34 (Screw) ×1 IMPLANT
SCREW NONLOCK PLATE R3 4.2X38 (Screw) ×1 IMPLANT
SPONGE T-LAP 18X18 ~~LOC~~+RFID (SPONGE) ×2 IMPLANT
SUCTION FRAZIER HANDLE 10FR (MISCELLANEOUS) ×1
SUCTION TUBE FRAZIER 10FR DISP (MISCELLANEOUS) ×1 IMPLANT
SUT ETHILON 2 0 PSLX (SUTURE) ×5 IMPLANT
TOWEL GREEN STERILE (TOWEL DISPOSABLE) ×2 IMPLANT
TOWEL GREEN STERILE FF (TOWEL DISPOSABLE) ×2 IMPLANT
TUBE CONNECTING 12X1/4 (SUCTIONS) ×2 IMPLANT

## 2021-11-30 NOTE — Plan of Care (Signed)

## 2021-11-30 NOTE — Anesthesia Postprocedure Evaluation (Signed)
Anesthesia Post Note  Patient: Sumaiya Miralles  Procedure(s) Performed: LEFT TIBIOCALCANEAL FUSION (Left: Foot)     Patient location during evaluation: PACU Anesthesia Type: Regional and General Level of consciousness: awake and alert Pain management: pain level controlled Vital Signs Assessment: post-procedure vital signs reviewed and stable Respiratory status: spontaneous breathing, nonlabored ventilation, respiratory function stable and patient connected to nasal cannula oxygen Cardiovascular status: blood pressure returned to baseline and stable Postop Assessment: no apparent nausea or vomiting Anesthetic complications: no   No notable events documented.  Last Vitals:  Vitals:   11/30/21 1326 11/30/21 1614  BP: 113/67 110/74  Pulse: (!) 103 92  Resp: 18 18  Temp: 37 C 36.8 C  SpO2: 98% 99%    Last Pain:  Vitals:   11/30/21 1614  TempSrc: Oral  PainSc:                  Lokelani Lutes L Tredarius Cobern

## 2021-11-30 NOTE — Op Note (Signed)
11/30/2021  10:00 AM  PATIENT:  Katrina Wolfe    PRE-OPERATIVE DIAGNOSIS:  Charcot Cavovarus Collapse Left Foot/Ankle  POST-OPERATIVE DIAGNOSIS:  Same  PROCEDURE:  LEFT TIBIOCALCANEAL FUSION C arm fluoroscopy to verify alignment with internal fixation. Application 2 cc of demineralized bone matrix.  SURGEON:  Newt Minion, MD  PHYSICIAN ASSISTANT:None ANESTHESIA:   General  PREOPERATIVE INDICATIONS:  [name] is a  55 y.o. female with a diagnosis of Charcot Cavovarus Collapse Left Foot/Ankle who failed conservative measures and elected for surgical management.    The risks benefits and alternatives were discussed with the patient preoperatively including but not limited to the risks of infection, bleeding, nerve injury, cardiopulmonary complications, the need for revision surgery, among others, and the patient was willing to proceed.  OPERATIVE IMPLANTS: Paragon anterior tibial calcaneal fusion plate. 2 cc demineralized bone matrix.  @ENCIMAGES @  OPERATIVE FINDINGS: C-arm fluoroscopy verified alignment in both AP and lateral planes.  OPERATIVE PROCEDURE: Patient was brought the operating room after undergoing a regional anesthetic she then underwent a general anesthetic.  After adequate levels anesthesia were obtained patient's left lower extremity was prepped using DuraPrep draped into a sterile field a timeout was called.  The chronic lateral ulcer over the fifth metatarsal from patient weightbearing on the lateral column was draped out of the sterile field with Ioban dressing.  A anterior incision was made medial to the neurovascular bundle between the EHL and anterior tibial tendon.  This was carried sharply down to bone.  Subperiosteal dissection was used to identify the tibial talar joint.  Retractors were placed and a rim blade was used to debride the tibial talar joint back to bleeding viable subchondral bone.  Increased bone was taken from the lateral column to allow  for plantigrade foot.  The joint was reduced.  The anterior fusion plate was secured to the talus distally and the plate was then again used to further provide lateral column shortening.  2 screws were placed proximally and C-arm possibly verified alignment.  Patient had a total of 3 compression screws placed in the tibia.  A compression screw was then placed anteriorly from the talus to the calcaneus and the oblique screw was placed from the tibia talus to the calcaneus.  The large compression screws were placed to stabilize the tibial calcaneal fusion.  The wound was irrigated with normal saline.  The fusion site was packed with 2 cc of demineralized bone matrix.  C-arm fluoroscopy was used to verify alignment.  The incision was closed using 2-0 nylon.  After the incision was closed the callus was pared from the lateral foot wound.  Adaptic 4 x 4's ABD Curlex and Coban was applied for a dressing.  Patient was extubated taken the PACU in stable condition.   DISCHARGE PLANNING:  Antibiotic duration: Antibiotics for 24 hours  Weightbearing: Nonweightbearing on the left  Pain medication: Opioid pathway  Dressing care/ Wound VAC: Dry dressing  Ambulatory devices: Walker or crutches  Discharge to: Anticipate observation and discharge to home.  Follow-up: In the office 1 week post operative.

## 2021-11-30 NOTE — Progress Notes (Signed)
Orthopedic Tech Progress Note Patient Details:  Katrina Wolfe 1966-10-25 [id number]  Fitted patient with CAM WALKER, due to patient having a bulky dressing on foot I removed boot and put on the countertop.Manson Passey Devices Type of Ortho Device: CAM walker Ortho Device/Splint Location: LLE Ortho Device/Splint Interventions: Ordered   Post Interventions Patient Tolerated: Well Instructions Provided: Care of device  Janit Pagan 11/30/2021, 2:53 PM

## 2021-11-30 NOTE — Plan of Care (Signed)
°  Problem: Education: Goal: Knowledge of General Education information will improve Description: Including pain rating scale, medication(s)/side effects and non-pharmacologic comfort measures Outcome: Progressing   Problem: Health Behavior/Discharge Planning: Goal: Ability to manage health-related needs will improve Outcome: Progressing   Problem: Clinical Measurements: Goal: Ability to maintain clinical measurements within normal limits will improve Outcome: Progressing Goal: Will remain free from infection Outcome: Progressing Goal: Diagnostic test results will improve Outcome: Progressing Goal: Respiratory complications will improve Outcome: Progressing Goal: Cardiovascular complication will be avoided Outcome: Progressing   Problem: Clinical Measurements: Goal: Will remain free from infection Outcome: Progressing   Problem: Clinical Measurements: Goal: Diagnostic test results will improve Outcome: Progressing   Problem: Clinical Measurements: Goal: Respiratory complications will improve Outcome: Progressing   Problem: Activity: Goal: Risk for activity intolerance will decrease Outcome: Progressing   Problem: Nutrition: Goal: Adequate nutrition will be maintained Outcome: Progressing   Problem: Coping: Goal: Level of anxiety will decrease Outcome: Progressing   Problem: Safety: Goal: Ability to remain free from injury will improve Outcome: Progressing   Problem: Pain Managment: Goal: General experience of comfort will improve Outcome: Progressing   Problem: Skin Integrity: Goal: Risk for impaired skin integrity will decrease Outcome: Progressing

## 2021-11-30 NOTE — Transfer of Care (Signed)
Immediate Anesthesia Transfer of Care Note  Patient: Katrina Wolfe  Procedure(s) Performed: LEFT TIBIOCALCANEAL FUSION (Left: Foot)  Patient Location: PACU  Anesthesia Type:General  Level of Consciousness: awake, alert  and oriented  Airway & Oxygen Therapy: Patient Spontanous Breathing  Post-op Assessment: Report given to RN and Post -op Vital signs reviewed and stable  Post vital signs: Reviewed and stable  Last Vitals:  Vitals Value Taken Time  BP    Temp    Pulse    Resp    SpO2      Last Pain:  Vitals:   11/30/21 0657  TempSrc:   PainSc: 0-No pain         Complications: No notable events documented.

## 2021-11-30 NOTE — Anesthesia Procedure Notes (Signed)
Anesthesia Regional Block: Adductor canal block   Pre-Anesthetic Checklist: , timeout performed,  Correct Patient, Correct Site, Correct Laterality,  Correct Procedure, Correct Position, site marked,  Risks and benefits discussed,  Surgical consent,  Pre-op evaluation,  At surgeon's request and post-op pain management  Laterality: Left  Prep: Maximum Sterile Barrier Precautions used, chloraprep       Needles:  Injection technique: Single-shot  Needle Type: Echogenic Stimulator Needle     Needle Length: 9cm  Needle Gauge: 22     Additional Needles:   Procedures:,,,, ultrasound used (permanent image in chart),,    Narrative:  Start time: 11/30/2021 8:15 AM End time: 11/30/2021 8:18 AM Injection made incrementally with aspirations every 5 mL.  Performed by: Personally  Anesthesiologist: Freddrick March, MD  Additional Notes: Monitors applied. No increased pain on injection. No increased resistance to injection. Injection made in 5cc increments. Good needle visualization. Patient tolerated procedure well.

## 2021-11-30 NOTE — Anesthesia Procedure Notes (Signed)
Anesthesia Regional Block: Popliteal block   Pre-Anesthetic Checklist: , timeout performed,  Correct Patient, Correct Site, Correct Laterality,  Correct Procedure, Correct Position, site marked,  Risks and benefits discussed,  Surgical consent,  Pre-op evaluation,  At surgeon's request and post-op pain management  Laterality: Left  Prep: Maximum Sterile Barrier Precautions used, chloraprep       Needles:  Injection technique: Single-shot  Needle Type: Echogenic Stimulator Needle     Needle Length: 9cm  Needle Gauge: 22     Additional Needles:   Procedures:,,,, ultrasound used (permanent image in chart),,    Narrative:  Start time: 11/30/2021 8:10 AM End time: 11/30/2021 8:15 AM Injection made incrementally with aspirations every 5 mL.  Performed by: Personally  Anesthesiologist: Freddrick March, MD  Additional Notes: Monitors applied. No increased pain on injection. No increased resistance to injection. Injection made in 5cc increments. Good needle visualization. Patient tolerated procedure well.

## 2021-11-30 NOTE — H&P (Signed)
Katrina Wolfe is an 55 y.o. female.   Chief Complaint: equinovarus contracture left ankle HPI: Patient has progressive equinous contracture left ankle. Due to worsening deformity she has increased weight bearing over the later aspect of the foot with non healing ulcer . She had an unstable ankle collapse and difficulty with ambulation  Past Medical History:  Diagnosis Date   Abnormal uterine bleeding (AUB)    Arthritis    knees, fingers   Asthma    as child, no problems in 20 yrs   Cancer ([place])    CKD (chronic kidney disease), stage II    Congenital cardiomegaly    per pt has always been told since childhood and siblings    Diabetes mellitus without complication ([place])    GERD (gastroesophageal reflux disease)    History of genital warts    HIV (human immunodeficiency virus infection) ([place])    DX 1998--  MONITORED BY INFECTIOUS DISEASE-- DR Carlyle Basques   HIV (human immunodeficiency virus infection) ([place])    Hypercholesterolemia    Hypertension    Intermittent palpitations    mild-- no meds   Legally blind in left eye, as defined in [place]    trauma as child   Peripheral vascular disease ([place])    Renal disorder    Tuberculosis    ? 2005 or 2007   Type 2 diabetes mellitus ([place])    Type II    Uterine fibroid    VIN III (vulvar intraepithelial neoplasia III)    and Verrucoid lesion on the mons   Wears glasses     Past Surgical History:  Procedure Laterality Date   CESAREAN SECTION  2001  &  4818   CO2 LASER APPLICATION N/A 56/31/4970   Procedure: CO2 LASER APPLICATION AND WIDE VOCAL EXCSION OF LESION ON MONS PUBIS;  Surgeon: Marti Sleigh, MD;  Location: [facility];  Service: Gynecology;  Laterality: N/A;   CASE CANCELLED    CO2 LASER APPLICATION N/A 26/37/8588   Procedure: CO2 LASER OF VULVAR;  Surgeon: Marti Sleigh, MD;  Location: [facility];  Service: Gynecology;  Laterality: N/A;   CORNEAL TRANSPLANT Left 12/18/2004    failed   D & C HYSTEROSCOPY /  RESECTION FIBROID  12/18/2002   DILATATION & CURETTAGE/HYSTEROSCOPY WITH MYOSURE N/A 10/01/2019   Procedure: DILATATION & CURETTAGE/HYSTEROSCOPY WITH MYOSURE and HYDROTHERMAL ABLATION;  Surgeon: Thurnell Lose, MD;  Location: [facility];  Service: Gynecology;  Laterality: N/A;  HTA rep will be here. Confirmed on 09/25/19 CS   EYE SURGERY     HARDWARE REMOVAL Left 04/04/2019   Procedure: EXCISION BONE AND REMOVE DEEP HARDWARE LEFT 5TH METATARSAL;  Surgeon: Newt Minion, MD;  Location: [facility];  Service: Orthopedics;  Laterality: Left;   I & D EXTREMITY Left 06/27/2021   Procedure: IRRIGATION AND DEBRIDEMENT FOOT ULCER;  Surgeon: Jessy Oto, MD;  Location: [place];  Service: Orthopedics;  Laterality: Left;   KNEE ARTHROSCOPY W/ ACL RECONSTRUCTION Bilateral 12/18/2006   ORIF TOE FRACTURE Left 08/02/2017   Procedure: OPEN REDUCTION INTERNAL FIXATION (ORIF) FIFTH METATARSAL (TOE) BASE FRACTURE;  Surgeon: Wylene Simmer, MD;  Location: [place];  Service: Orthopedics;  Laterality: Left;   VULVECTOMY N/A 01/07/2016   Procedure: WIDE LOCAL EXCISION;  Surgeon: Marti Sleigh, MD;  Location: [facility];  Service: Gynecology;  Laterality: N/A;  mons pubis as site    Family History  Problem Relation Age of Onset   Hypertension Mother  Diabetes Father    Cancer Brother    Breast cancer Cousin    Social History:  reports that she has never smoked. She has never used smokeless tobacco. She reports that she does not drink alcohol and does not use drugs.  Allergies:  Allergies  Allergen Reactions   Shellfish Allergy Anaphylaxis    All types shellfish   Bactrim [Sulfamethoxazole-Trimethoprim] Hives    Medications Prior to Admission  Medication Sig Dispense Refill   acetaminophen (TYLENOL) 500 MG tablet Take 1,000 mg by mouth every 6 (six) hours as needed for moderate pain.     amLODipine (NORVASC) 5 MG tablet  Take 5 mg by mouth in the morning.     aspirin EC 81 MG tablet Take 1 tablet (81 mg total) by mouth 2 (two) times daily. (Patient taking differently: Take 81 mg by mouth daily.) 84 tablet 0   BIKTARVY 50-200-25 MG TABS tablet TAKE ONE TABLET BY MOUTH every evening 30 tablet 5   calcium carbonate (TUMS - DOSED IN MG ELEMENTAL CALCIUM) 500 MG chewable tablet Chew 1 tablet by mouth as needed for indigestion or heartburn.     Calcium Carbonate-Vitamin D 600-200 MG-UNIT TABS Take 1 tablet by mouth daily.     diphenhydrAMINE (BENADRYL) 25 MG tablet Take 25 mg by mouth every 6 (six) hours as needed for allergies (or allergic reactions).     FARXIGA 10 MG TABS tablet Take 10 mg by mouth in the morning.     ferrous sulfate 325 (65 FE) MG EC tablet Take 325 mg by mouth daily with breakfast.     gabapentin (NEURONTIN) 100 MG capsule Take 100 mg by mouth 4 (four) times daily.     glimepiride (AMARYL) 2 MG tablet Take 2 mg by mouth See admin instructions. Take 4 mg by mouth in the morning and 2 mg in the evening     HUMALOG KWIKPEN 100 UNIT/ML KwikPen Inject 10-15 Units into the skin daily with lunch.     HUMALOG MIX 75/25 KWIKPEN (75-25) 100 UNIT/ML Kwikpen Inject 60 Units into the skin 2 (two) times daily.     lisinopril-hydrochlorothiazide (ZESTORETIC) 20-25 MG tablet Take 1 tablet by mouth daily.     methocarbamol (ROBAXIN) 500 MG tablet Take 1 tablet (500 mg total) by mouth every 8 (eight) hours as needed for muscle spasms. 30 tablet 0   norethindrone (AYGESTIN) 5 MG tablet Take 5 mg by mouth in the morning.     traMADol (ULTRAM) 50 MG tablet Take 50 mg by mouth every 4 (four) hours as needed for moderate pain.     HYDROcodone-acetaminophen (NORCO/VICODIN) 5-325 MG tablet Take 1 tablet by mouth every 6 (six) hours as needed for severe pain. (Patient not taking: Reported on 11/22/2021) 6 tablet 0   HYDROcodone-acetaminophen (NORCO/VICODIN) 5-325 MG tablet Take 1-2 tablets by mouth every 4 (four) hours as  needed for moderate pain (pain score 4-6). (Patient not taking: Reported on 11/22/2021) 20 tablet 0   Insulin Syringe-Needle U-100 (INSULIN SYRINGE 1CC/31GX5/16") 31G X 5/16" 1 ML MISC 1 Units by Does not apply route 2 (two) times daily. 30 each 0   Lancets (ONETOUCH ULTRASOFT) lancets      ONE TOUCH ULTRA TEST test strip       Results for orders placed or performed during the hospital encounter of 11/30/21 (from the past 48 hour(s))  Glucose, capillary     Status: Abnormal   Collection Time: 11/30/21  6:32 AM  Result Value Ref Range  Glucose-Capillary 136 (H) 70 - 99 mg/dL    Comment: Glucose reference range applies only to samples taken after fasting for at least 8 hours.   No results found.  Review of Systems  All other systems reviewed and are negative.  Blood pressure 108/63, pulse 87, temperature 98.4 F (36.9 C), temperature source Oral, resp. rate 18, height 5' 9.5" (1.765 m), weight 121.1 kg, SpO2 96 %. Physical Exam  Patient is alert, oriented, no adenopathy, well-dressed, normal affect, normal respiratory effort. Examination patient has a strong palpable dorsalis pedis pulse.  She has a granulating ulcer over the base of the fifth metatarsal which is 2 cm in diameter 3 mm deep does not probe to bone or tendon.  There is no cellulitis no tenderness to palpation.  She has an unstable cavovarus deformity with weightbearing her foot rotates approximately 90 degrees.  Most recent hemoglobin A1c is 8.8. Assessment/Plan Unstable charcot ankle collapse left  Plan: Discussed with the patient options include continue with bracing and wound care versus tibial calcaneal fusion to provide a plantigrade foot.  Discussed that she has an increased risk of bone not healing incision not healing and potential for a below the knee amputation.  Patient states she understands and would like to proceed with foot salvage intervention.  Newt Minion, MD 11/30/2021, 6:41 AM

## 2021-12-01 DIAGNOSIS — N182 Chronic kidney disease, stage 2 (mild): Secondary | ICD-10-CM | POA: Diagnosis not present

## 2021-12-01 DIAGNOSIS — I129 Hypertensive chronic kidney disease with stage 1 through stage 4 chronic kidney disease, or unspecified chronic kidney disease: Secondary | ICD-10-CM | POA: Diagnosis not present

## 2021-12-01 DIAGNOSIS — Z7982 Long term (current) use of aspirin: Secondary | ICD-10-CM | POA: Diagnosis not present

## 2021-12-01 DIAGNOSIS — J45909 Unspecified asthma, uncomplicated: Secondary | ICD-10-CM | POA: Diagnosis not present

## 2021-12-01 DIAGNOSIS — Z794 Long term (current) use of insulin: Secondary | ICD-10-CM | POA: Diagnosis not present

## 2021-12-01 DIAGNOSIS — M14672 Charcot's joint, left ankle and foot: Secondary | ICD-10-CM | POA: Diagnosis not present

## 2021-12-01 DIAGNOSIS — M216X2 Other acquired deformities of left foot: Secondary | ICD-10-CM | POA: Diagnosis not present

## 2021-12-01 DIAGNOSIS — E1122 Type 2 diabetes mellitus with diabetic chronic kidney disease: Secondary | ICD-10-CM | POA: Diagnosis not present

## 2021-12-01 DIAGNOSIS — Z79899 Other long term (current) drug therapy: Secondary | ICD-10-CM | POA: Diagnosis not present

## 2021-12-01 LAB — GLUCOSE, CAPILLARY
Glucose-Capillary: 321 mg/dL — ABNORMAL HIGH (ref 70–99)
Glucose-Capillary: 326 mg/dL — ABNORMAL HIGH (ref 70–99)
Glucose-Capillary: 346 mg/dL — ABNORMAL HIGH (ref 70–99)

## 2021-12-01 MED ORDER — HYDROCODONE-ACETAMINOPHEN 5-325 MG PO TABS: 1.0000 | ORAL_TABLET | ORAL | 0 refills | Status: DC | PRN

## 2021-12-01 NOTE — Evaluation (Signed)
Physical Therapy Evaluation Patient Details Name: Katrina Wolfe MRN: [id number] DOB: 1966-03-29 Today's Date: 12/01/2021  History of Present Illness  55 y.o. female admitted on 11/30/21 for elective L tibocalcaneal fusion, TDWB post op in CAM boot.  Pt with significant PMH of CKD, CA, HIV, HTN, L eye legally blind, PVD, TB, DM2, TKA bil.  Clinical Impression  Pt showed proficiency with transfers in and out of [facility] with one person assist.  She has excellent support from family at home and is familiar with bump up in [facility] into home up stairs (her son and grandson help with this).  I assisted in donning new CAM boot and she gave me permission to throw away her old one which had 2 years of wear and tear.  PT to follow acutely, however, if MD deems her ok to go home today medically, she is ready physically as well.   PT to follow acutely for deficits listed below.      Recommendations for follow up therapy are one component of a multi-disciplinary discharge planning process, led by the attending physician.  Recommendations may be updated based on patient status, additional functional criteria and insurance authorization.  Follow Up Recommendations No PT follow up    Assistance Recommended at Discharge Intermittent Supervision/Assistance  Functional Status Assessment Patient has had a recent decline in their functional status and demonstrates the ability to make significant improvements in function in a reasonable and predictable amount of time.  Equipment Recommendations  None recommended by PT (pt to get her own tub transfer bench when/if ready)    Recommendations for Other Services       Precautions / Restrictions Precautions Precautions: Fall Required Braces or Orthoses: Other Brace Other Brace: CAM boot L foot when up Restrictions LLE Weight Bearing: Touchdown weight bearing (L foot "limit WB as much as possible" per MD note)      Mobility  Bed Mobility               General bed  mobility comments: Pt was OOB in the recliner chair.    Transfers Overall transfer level: Needs assistance Equipment used: Rolling walker (2 wheels) Transfers: Sit to/from Stand;Bed to chair/wheelchair/BSC Sit to Stand: Min assist Stand pivot transfers: Min assist         General transfer comment: Pt did better standing from recliner chair than bed (with mobility tech earlier), likely due to available raised arm rests.  She set up her WC, showed safety locking both breaks and swinging away arm rest on the side she was transferring towards.  Min assist to power up, stabilize and ensure balance when transferring to the WC.  She likely put more than TDWB on left foot, however, CAM boot was donned and she knew to keep WB as light as possible (which is why she will likely continue primary WC use until the ankle is healed).    Ambulation/Gait                  Stairs            Wheelchair Mobility    Modified Rankin (Stroke Patients Only)       Balance Overall balance assessment: Needs assistance         Standing balance support: Bilateral upper extremity supported Standing balance-Leahy Scale: Poor Standing balance comment: min assist from therapist and support from [place]  Pertinent Vitals/Pain Pain Assessment: 0-10 Pain Score: 3  Pain Location: left lower leg Pain Descriptors / Indicators: Grimacing;Guarding Pain Intervention(s): Limited activity within patient's tolerance;Monitored during session;Repositioned    Home Living Family/patient expects to be discharged to:: Private residence Living Arrangements: Children;Other relatives (daughter, multiple grandchildren) Available Help at Discharge: Family;Available PRN/intermittently Type of Home: House Home Access: Stairs to enter Entrance Stairs-Rails: None Entrance Stairs-Number of Steps: 2-3   Home Layout: One level Home Equipment: Wheelchair - Publishing copy (2  wheels);BSC/3in1      Prior Function Prior Level of Function : Needs assist       Physical Assist : Mobility (physical) Mobility (physical): Stairs   Mobility Comments: assist needed for in/out of home by son and grandson-WC bump up, looking into getting a ramp       Hand Dominance   Dominant Hand: Right    Extremity/Trunk Assessment   Upper Extremity Assessment Upper Extremity Assessment: Overall WFL for tasks assessed    Lower Extremity Assessment Lower Extremity Assessment: RLE deficits/detail;LLE deficits/detail RLE Deficits / Details: right leg weak from being WC bound for ~2 years trying to heal up her left foot/ankle.  Also, older bil TKA making legs not very stable or strong.  Pt is looking forward to having therapy when she is healed to get her knees stronger. LLE Deficits / Details: L lower leg wrapped in coban, toes out with good sensation and strength (able to wiggle them), knee at least 3/5, hip flexion 3-/5    Cervical / Trunk Assessment Cervical / Trunk Assessment: Normal  Communication      Cognition Arousal/Alertness: Awake/alert Behavior During Therapy: WFL for tasks assessed/performed Overall Cognitive Status: Within Functional Limits for tasks assessed                                          General Comments General comments (skin integrity, edema, etc.): Encouraged LE elevation for swelling purposes at rest.  Pt wanted PT to throw away her old CAM boot, so I did.    Exercises     Assessment/Plan    PT Assessment Patient needs continued PT services  PT Problem List Decreased strength;Decreased range of motion;Decreased activity tolerance;Decreased balance;Decreased mobility;Decreased knowledge of use of DME;Decreased knowledge of precautions;Pain       PT Treatment Interventions Stair training;DME instruction;Gait training;Functional mobility training;Therapeutic activities;Therapeutic exercise;Balance training;Neuromuscular  re-education;Patient/family education;Wheelchair mobility training    PT Goals (Current goals can be found in the Care Plan section)  Acute Rehab PT Goals Patient Stated Goal: to go home today if she can PT Goal Formulation: With patient Time For Goal Achievement: 12/15/21 Potential to Achieve Goals: Good    Frequency Min 5X/week   Barriers to discharge        Co-evaluation               AM-PAC PT "6 Clicks" Mobility  Outcome Measure Help needed turning from your back to your side while in a flat bed without using bedrails?: A Little Help needed moving from lying on your back to sitting on the side of a flat bed without using bedrails?: A Little Help needed moving to and from a bed to a chair (including a wheelchair)?: A Little Help needed standing up from a chair using your arms (e.g., wheelchair or bedside chair)?: A Little Help needed to walk in hospital room?: Total Help needed climbing 3-5  steps with a railing? : Total 6 Click Score: 14    End of Session Equipment Utilized During Treatment: Other (comment) (L CAM boot) Activity Tolerance: Patient limited by pain Patient left: in chair;with call bell/phone within reach;with family/visitor present Nurse Communication: Mobility status PT Visit Diagnosis: Muscle weakness (generalized) (M62.81);Difficulty in walking, not elsewhere classified (R26.2);Pain Pain - Right/Left: Left Pain - part of body: Ankle and joints of foot    Time: 1101-1118 PT Time Calculation (min) (ACUTE ONLY): 17 min   Charges:   PT Evaluation $PT Eval Moderate Complexity: 1 Mod        Verdene Lennert, PT, DPT  Acute Rehabilitation Ortho Tech Supervisor [phone] pager [phone]) [phone] office

## 2021-12-01 NOTE — Progress Notes (Signed)
Mobility Specialist Criteria Algorithm Info.   12/01/21 1030  Mobility  Activity Transferred:  Bed to chair  Range of Motion/Exercises Active;All extremities  Level of Assistance Moderate assist, patient does 50-74%  Assistive Device Front wheel walker  LLE Weight Bearing NWB  Mobility Out of bed to chair with meals  Mobility Response Tolerated well  Mobility performed by Mobility specialist  Bed Position Chair   Patient received in bed eager to participate in mobility with anticipation to be discharged. Prior to getting up, reviewed NWB precautions with receptive teach back. Is independent for bed mobility and required mod A sit>stand w/cues to adhere to NWB on LLE. Pt was able to pivot to recliner chair with minimal assistance. Tolerated well without complaint of pain or incident and was left in chair with all needs met.   12/01/2021 12:04 PM

## 2021-12-01 NOTE — Progress Notes (Signed)
Patient ID: Katrina Wolfe, female   DOB: 07-16-1966, 55 y.o.   MRN: [id number] Patient is postoperative day 1 tibial calcaneal fusion.  She has no complaints this morning.  Plan for physical therapy with ideally minimal weightbearing on the left lower extremity with the fracture boot in place during therapy.  Plan for discharge to home today after therapy.  Patient to use the fracture boot for ambulation.

## 2021-12-01 NOTE — Plan of Care (Addendum)
Patient educated on DC orders. Patient voices understanding and no further questions. Patient stable and ready to DC. No equipment needed for home.  Problem: Education: Goal: Knowledge of General Education information will improve Description: Including pain rating scale, medication(s)/side effects and non-pharmacologic comfort measures Outcome: Progressing   Problem: Activity: Goal: Risk for activity intolerance will decrease Outcome: Progressing   Problem: Pain Managment: Goal: General experience of comfort will improve Outcome: Progressing   Problem: Safety: Goal: Ability to remain free from injury will improve Outcome: Progressing   Problem: Skin Integrity: Goal: Risk for impaired skin integrity will decrease Outcome: Progressing

## 2021-12-01 NOTE — TOC Transition Note (Signed)
Transition of Care [facility]) - CM/SW Discharge Note   Patient Details  Name: Jonea Bukowski MRN: [id number] Date of Birth: 28-Dec-1965  Transition of Care [facility] CM/SW Contact:  Sharin Mons, RN Phone Number: 12/01/2021, 12:00 PM   Clinical Narrative:    Patient will DC to: Home Anticipated DC date: 12/01/2021 Family notified: yes Transport by: car        -s/p  LEFT TIBIOCALCANEAL FUSION, 12/14 Per MD patient ready for DC today. RN, patient, and patient's family aware of DC. Pt states family will provide and assist with care once d/c. Pt without DME needs.  Post hospital f/u noted on AVS Family to provide transportation to home.  RNCM will sign off for now as intervention is no longer needed. Please consult [place] again if new needs arise.    Final next level of care: Home/Self Care Barriers to Discharge: No Barriers Identified   Patient Goals and CMS Choice        Discharge Placement                       Discharge Plan and Services                                     Social Determinants of Health (SDOH) Interventions     Readmission Risk Interventions No flowsheet data found.

## 2021-12-01 NOTE — Discharge Summary (Signed)
Discharge Diagnoses:  Principal Problem:   S/P ankle fusion Active Problems:   Charcot's joint, left ankle and foot   Surgeries: Procedure(s): LEFT TIBIOCALCANEAL FUSION on 11/30/2021    Consultants:   Discharged Condition: Improved  Hospital Course: Katrina Wolfe is an 55 y.o. female who was admitted 11/30/2021 with a chief complaint of Charcot cavovarus collapse left foot, with a final diagnosis of Charcot Cavovarus Collapse Left Foot/Ankle.  Patient was brought to the operating room on 11/30/2021 and underwent Procedure(s): LEFT TIBIOCALCANEAL FUSION.    Patient was given perioperative antibiotics:  Anti-infectives (From admission, onward)    Start     Dose/Rate Route Frequency Ordered Stop   11/30/21 1800  bictegravir-emtricitabine-tenofovir AF (BIKTARVY) 50-200-25 MG per tablet 1 tablet        1 tablet Oral Every evening 11/30/21 1355     11/30/21 1445  ceFAZolin (ANCEF) IVPB 2g/100 mL premix        2 g 200 mL/hr over 30 Minutes Intravenous Every 6 hours 11/30/21 1356 12/01/21 0149   11/30/21 0700  ceFAZolin (ANCEF) IVPB 3g/100 mL premix  Status:  Discontinued        3 g 200 mL/hr over 30 Minutes Intravenous On call to O.R. 11/30/21 6295 11/30/21 1314     .  Patient was given sequential compression devices, early ambulation, and aspirin for DVT prophylaxis.  Recent vital signs: Patient Vitals for the past 24 hrs:  BP Temp Temp src Pulse Resp SpO2  12/01/21 0734 124/69 98.3 F (36.8 C) Oral 91 16 96 %  11/30/21 2030 109/70 98.3 F (36.8 C) Oral 88 18 94 %  11/30/21 1614 110/74 98.2 F (36.8 C) Oral 92 18 99 %  11/30/21 1326 113/67 98.6 F (37 C) Oral (!) 103 18 98 %  11/30/21 1230 120/83 -- -- (!) 102 (!) 22 97 %  11/30/21 1200 122/77 -- -- 95 19 97 %  11/30/21 1129 114/74 -- -- 95 19 96 %  11/30/21 1059 126/75 -- -- 88 20 97 %  11/30/21 1029 129/76 97.7 F (36.5 C) -- 86 19 97 %  11/30/21 1014 136/84 -- -- 84 18 98 %  11/30/21 0959 124/74 97.7 F (36.5 C)  -- 91 (!) 21 98 %  .  Recent laboratory studies: No results found.  Discharge Medications:   Allergies as of 12/01/2021       Reactions   Shellfish Allergy Anaphylaxis   All types shellfish   Bactrim [sulfamethoxazole-trimethoprim] Hives        Medication List     TAKE these medications    acetaminophen 500 MG tablet Commonly known as: TYLENOL Take 1,000 mg by mouth every 6 (six) hours as needed for moderate pain.   amLODipine 5 MG tablet Commonly known as: NORVASC Take 5 mg by mouth in the morning.   aspirin EC 81 MG tablet Take 1 tablet (81 mg total) by mouth 2 (two) times daily. What changed: when to take this   Biktarvy 50-200-25 MG Tabs tablet Generic drug: bictegravir-emtricitabine-tenofovir AF TAKE ONE TABLET BY MOUTH every evening   calcium carbonate 500 MG chewable tablet Commonly known as: TUMS - dosed in mg elemental calcium Chew 1 tablet by mouth as needed for indigestion or heartburn.   Calcium Carbonate-Vitamin D 600-200 MG-UNIT Tabs Take 1 tablet by mouth daily.   diphenhydrAMINE 25 MG tablet Commonly known as: BENADRYL Take 25 mg by mouth every 6 (six) hours as needed for allergies (or allergic reactions).   Wilder Glade  10 MG Tabs tablet Generic drug: dapagliflozin propanediol Take 10 mg by mouth in the morning.   ferrous sulfate 325 (65 FE) MG EC tablet Take 325 mg by mouth daily with breakfast.   gabapentin 100 MG capsule Commonly known as: NEURONTIN Take 100 mg by mouth 4 (four) times daily.   glimepiride 2 MG tablet Commonly known as: AMARYL Take 2 mg by mouth See admin instructions. Take 4 mg by mouth in the morning and 2 mg in the evening   HumaLOG KwikPen 100 UNIT/ML KwikPen Generic drug: insulin lispro Inject 10-15 Units into the skin daily with lunch.   HumaLOG Mix 75/25 KwikPen (75-25) 100 UNIT/ML Kwikpen Generic drug: Insulin Lispro Prot & Lispro Inject 60 Units into the skin 2 (two) times daily.   HYDROcodone-acetaminophen  5-325 MG tablet Commonly known as: NORCO/VICODIN Take 1 tablet by mouth every 4 (four) hours as needed. What changed:  when to take this reasons to take this Another medication with the same name was removed. Continue taking this medication, and follow the directions you see here.   INSULIN SYRINGE 1CC/31GX5/16" 31G X 5/16" 1 ML Misc 1 Units by Does not apply route 2 (two) times daily.   lisinopril-hydrochlorothiazide 20-25 MG tablet Commonly known as: ZESTORETIC Take 1 tablet by mouth daily.   methocarbamol 500 MG tablet Commonly known as: Robaxin Take 1 tablet (500 mg total) by mouth every 8 (eight) hours as needed for muscle spasms.   norethindrone 5 MG tablet Commonly known as: AYGESTIN Take 5 mg by mouth in the morning.   ONE TOUCH ULTRA TEST test strip Generic drug: glucose blood   onetouch ultrasoft lancets   traMADol 50 MG tablet Commonly known as: ULTRAM Take 50 mg by mouth every 4 (four) hours as needed for moderate pain.               Discharge Care Instructions  (From admission, onward)           Start     Ordered   12/01/21 0000  Touch down weight bearing       Question Answer Comment  Laterality left   Extremity Lower      12/01/21 0739            Diagnostic Studies: No results found.  Patient benefited maximally from their hospital stay and there were no complications.     Disposition: Discharge disposition: 01-Home or Self Care      Discharge Instructions     Call MD / Call 911   Complete by: As directed    If you experience chest pain or shortness of breath, CALL 911 and be transported to the hospital emergency room.  If you develope a fever above 101 F, pus (white drainage) or increased drainage or redness at the wound, or calf pain, call your surgeon's office.   Constipation Prevention   Complete by: As directed    Drink plenty of fluids.  Prune juice may be helpful.  You may use a stool softener, such as Colace (over  the counter) 100 mg twice a day.  Use MiraLax (over the counter) for constipation as needed.   Diet - low sodium heart healthy   Complete by: As directed    Increase activity slowly as tolerated   Complete by: As directed    Post-operative opioid taper instructions:   Complete by: As directed    POST-OPERATIVE OPIOID TAPER INSTRUCTIONS: It is important to wean off of your opioid medication as soon  as possible. If you do not need pain medication after your surgery it is ok to stop day one. Opioids include: Codeine, Hydrocodone(Norco, Vicodin), Oxycodone(Percocet, oxycontin) and hydromorphone amongst others.  Long term and even short term use of opiods can cause: Increased pain response Dependence Constipation Depression Respiratory depression And more.  Withdrawal symptoms can include Flu like symptoms Nausea, vomiting And more Techniques to manage these symptoms Hydrate well Eat regular healthy meals Stay active Use relaxation techniques(deep breathing, meditating, yoga) Do Not substitute Alcohol to help with tapering If you have been on opioids for less than two weeks and do not have pain than it is ok to stop all together.  Plan to wean off of opioids This plan should start within one week post op of your joint replacement. Maintain the same interval or time between taking each dose and first decrease the dose.  Cut the total daily intake of opioids by one tablet each day Next start to increase the time between doses. The last dose that should be eliminated is the evening dose.      Touch down weight bearing   Complete by: As directed    Laterality: left   Extremity: Lower       Follow-up Information     Newt Minion, MD Follow up in 1 week(s).   Specialty: Orthopedic Surgery Contact information: [address] [place] [address] [phone]                  Signed: Newt Minion 12/01/2021, 7:40 AM

## 2021-12-02 ENCOUNTER — Encounter (HOSPITAL_COMMUNITY): Payer: Self-pay | Admitting: Orthopedic Surgery

## 2021-12-07 ENCOUNTER — Encounter: Payer: Self-pay | Admitting: Orthopedic Surgery

## 2021-12-07 ENCOUNTER — Other Ambulatory Visit: Payer: Self-pay

## 2021-12-07 ENCOUNTER — Encounter: Payer: Self-pay | Admitting: Family

## 2021-12-07 ENCOUNTER — Ambulatory Visit (INDEPENDENT_AMBULATORY_CARE_PROVIDER_SITE_OTHER): Payer: Medicare Other | Admitting: Family

## 2021-12-07 DIAGNOSIS — Z981 Arthrodesis status: Secondary | ICD-10-CM

## 2021-12-07 NOTE — Progress Notes (Signed)
Office Visit Note   Patient: Katrina Wolfe           Date of Birth: 1966/03/21           MRN: [id number] Visit Date: 10/27/2021              Requested by: Wenda Low, MD [address] [place],  [place] [address] PCP: Wenda Low, MD  Chief Complaint  Patient presents with   Left Foot - Wound Check      HPI: Patient is a 55 year old woman who is seen in follow-up for Charcot collapse left foot with ulceration.  Her last hemoglobin A1c was 7.5.  Patient states she is homeless now and she will need to call to set up surgery.  Assessment & Plan: Visit Diagnoses:  1. Diabetic ulcer of left midfoot associated with diabetes mellitus due to underlying condition, with necrosis of bone ([place])   2. Charcot's arthropathy associated with type 2 diabetes mellitus ([place])     Plan: Patient will call when she can schedule surgery.  We will plan for a talonavicular fusion subtalar fusion and excision of the cuboid and base of the fifth metatarsal. Follow-Up Instructions: Return if symptoms worsen or fail to improve.   Ortho Exam  Patient is alert, oriented, no adenopathy, well-dressed, normal affect, normal respiratory effort. Examination patient has an ulcer over the partial cuboid excision.  She has a strong dorsalis pedis pulse.  Patient has a cavovarus collapse of the foot with primarily collapse through the talonavicular joint.  She has persistent ulceration the base of the fifth metatarsal with healthy granulation tissue.  There is no exposed bone.  Imaging: No results found. No images are attached to the encounter.  Labs: Lab Results  Component Value Date   HGBA1C 8.2 (H) 11/30/2021   HGBA1C 8.8 (H) 06/24/2021   ESRSEDRATE 53 (H) 08/15/2021   ESRSEDRATE 74 (H) 06/30/2021   CRP 18.5 (H) 08/15/2021   CRP 0.9 06/30/2021   REPTSTATUS 07/03/2021 FINAL 06/27/2021   GRAMSTAIN  06/27/2021    RARE WBC PRESENT,BOTH PMN AND MONONUCLEAR NO ORGANISMS SEEN    CULT   06/27/2021    RARE CORYNEBACTERIUM SPECIES Standardized susceptibility testing for this organism is not available. NO ANAEROBES ISOLATED Performed at [facility], [place] [address], [place], [place] [address]    LABORGA PSEUDOMONAS AERUGINOSA 04/04/2019   LABORGA SERRATIA MARCESCENS 04/04/2019   LABORGA STAPHYLOCOCCUS AUREUS 04/04/2019     Lab Results  Component Value Date   ALBUMIN 2.8 (L) 07/02/2021   ALBUMIN 2.8 (L) 07/01/2021   ALBUMIN 2.7 (L) 06/30/2021    Lab Results  Component Value Date   MG 1.6 (L) 07/04/2021   MG 1.5 (L) 07/03/2021   MG 1.6 (L) 07/02/2021   No results found for: VD25OH  No results found for: PREALBUMIN CBC EXTENDED Latest Ref Rng & Units 11/28/2021 07/04/2021 07/03/2021  WBC 4.0 - 10.5 K/uL 11.9(H) 11.2(H) 11.5(H)  RBC 3.87 - 5.11 MIL/uL 5.27(H) 4.08 3.98  HGB 12.0 - 15.0 g/dL 15.8(H) 12.8 12.8  HCT 36.0 - 46.0 % 48.6(H) 38.7 38.2  PLT 150 - 400 K/uL 422(H) 353 344  NEUTROABS 1.7 - 7.7 K/uL - 7.0 7.8(H)  LYMPHSABS 0.7 - 4.0 K/uL - 2.8 2.2     There is no height or weight on file to calculate BMI.  Orders:  No orders of the defined types were placed in this encounter.  No orders of the defined types were placed in  this encounter.    Procedures: No procedures performed  Clinical Data: No additional findings.  ROS:  All other systems negative, except as noted in the HPI. Review of Systems  Objective: Vital Signs: There were no vitals taken for this visit.  Specialty Comments:  No specialty comments available.  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, with necrosis of bone ([place])    Diabetes mellitus type 2, uncontrolled, with complications 96/22/2979   Diabetic nephropathy ([place]) 06/26/2021   CKD (chronic kidney disease), stage III ([place])  06/26/2021   HTN (hypertension) 06/24/2021   HIV (human immunodeficiency virus infection) ([place]) 06/24/2021   DM2 (diabetes mellitus, type 2) ([place]) 06/24/2021   HLD (hyperlipidemia) 06/24/2021   Osteomyelitis of left foot ([place]) 06/24/2021   Elevated LFTs 06/24/2021   Acute-on-chronic kidney injury ([place]) 06/24/2021   Osteomyelitis of foot ([place]) 89/21/1941   Hardware complicating wound infection ([place])    Subacute osteomyelitis, left ankle and foot ([place])    Open displaced fracture of fifth metatarsal bone of left foot    Healthcare maintenance 04/03/2019   HIV disease ([place]) 10/27/2015   DM type 2 (diabetes mellitus, type 2) ([place]) 10/27/2015   Hyperlipidemia 10/27/2015   Essential hypertension 10/27/2015   Severe vulvar dysplasia, histologically confirmed 10/15/2015   Past Medical History:  Diagnosis Date   Abnormal uterine bleeding (AUB)    Arthritis    knees, fingers   Asthma    as child, no problems in 20 yrs   Cancer ([place])    CKD (chronic kidney disease), stage II    Congenital cardiomegaly    per pt has always been told since childhood and siblings    Diabetes mellitus without complication ([place])    GERD (gastroesophageal reflux disease)    History of genital warts    HIV (human immunodeficiency virus infection) ([place])    [place]--  MONITORED BY INFECTIOUS DISEASE-- DR Carlyle Basques   HIV (human immunodeficiency virus infection) ([place])    Hypercholesterolemia    Hypertension    Intermittent palpitations    mild-- no meds   Legally blind in left eye, as defined in [place]    trauma as child   Peripheral vascular disease ([place])    Renal disorder    Tuberculosis    ? 2005 or 2007   Type 2 diabetes mellitus ([place])    Type II    Uterine fibroid    VIN III (vulvar intraepithelial neoplasia III)    and Verrucoid lesion on the mons   Wears glasses     Family History  Problem Relation Age of Onset   Hypertension Mother    Diabetes Father    Cancer Brother    Breast cancer  Cousin     Past Surgical History:  Procedure Laterality Date   CESAREAN SECTION  2001  &  7408   CO2 LASER APPLICATION N/A 14/48/1856   Procedure: CO2 LASER APPLICATION AND WIDE VOCAL EXCSION OF LESION ON MONS PUBIS;  Surgeon: Marti Sleigh, MD;  Location: [facility];  Service: Gynecology;  Laterality: N/A;   CASE CANCELLED    CO2 LASER APPLICATION N/A 31/49/7026   Procedure: CO2 LASER OF VULVAR;  Surgeon: Marti Sleigh, MD;  Location: [facility];  Service: Gynecology;  Laterality: N/A;   CORNEAL TRANSPLANT Left  12/18/2004   failed   D & C HYSTEROSCOPY /  RESECTION FIBROID  12/18/2002   DILATATION & CURETTAGE/HYSTEROSCOPY WITH MYOSURE N/A 10/01/2019   Procedure: DILATATION & CURETTAGE/HYSTEROSCOPY WITH MYOSURE and HYDROTHERMAL ABLATION;  Surgeon: Thurnell Lose, MD;  Location: [facility];  Service: Gynecology;  Laterality: N/A;  HTA rep will be here. Confirmed on 09/25/19 CS   EYE SURGERY     FOOT ARTHRODESIS Left 11/30/2021   Procedure: LEFT TIBIOCALCANEAL FUSION;  Surgeon: Newt Minion, MD;  Location: [facility];  Service: Orthopedics;  Laterality: Left;   HARDWARE REMOVAL Left 04/04/2019   Procedure: EXCISION BONE AND REMOVE DEEP HARDWARE LEFT 5TH METATARSAL;  Surgeon: Newt Minion, MD;  Location: [facility];  Service: Orthopedics;  Laterality: Left;   I & D EXTREMITY Left 06/27/2021   Procedure: IRRIGATION AND DEBRIDEMENT FOOT ULCER;  Surgeon: Jessy Oto, MD;  Location: [facility];  Service: Orthopedics;  Laterality: Left;   KNEE ARTHROSCOPY W/ ACL RECONSTRUCTION Bilateral 12/18/2006   ORIF TOE FRACTURE Left 08/02/2017   Procedure: OPEN REDUCTION INTERNAL FIXATION (ORIF) FIFTH METATARSAL (TOE) BASE FRACTURE;  Surgeon: Wylene Simmer, MD;  Location: [facility];  Service: Orthopedics;  Laterality: Left;   VULVECTOMY N/A 01/07/2016   Procedure: WIDE LOCAL EXCISION;  Surgeon: Marti Sleigh, MD;  Location: [facility];  Service: Gynecology;  Laterality: N/A;  mons pubis as site   Social History   Occupational History   Not on file  Tobacco Use   Smoking status: Never   Smokeless tobacco: Never  Vaping Use   Vaping Use: Never used  Substance and Sexual Activity   Alcohol use: No    Alcohol/week: 0.0 standard drinks   Drug use: No   Sexual activity: Yes    Partners: Male    Birth control/protection: None, Condom    Comment: declined condoms 03/07/21

## 2021-12-07 NOTE — Progress Notes (Signed)
Post-Op Visit Note   Patient: Katrina Wolfe           Date of Birth: Jul 24, 1966           MRN: [id number] Visit Date: 12/07/2021 PCP: Wenda Low, MD  Chief Complaint:  Chief Complaint  Patient presents with   Left Foot - Routine Post Op    Tib cal fusion 11/30/21    HPI:  HPI The patient is a 55 year old woman seen status post right tibiocalcaneal fusion December 14 Ortho Exam Incision well approximated with sutures.  Minimal edema no gaping no drainage no erythema no signs of infection  Visit Diagnoses: No diagnosis found.  Plan: Begin daily Dial soap cleansing.  Dry dressing changes.  Nonweightbearing.  Continue the cam walker.  Follow-up in 1 week for suture removal and radiographs.  Follow-Up Instructions: No follow-ups on file.   Imaging: No results found.  Orders:  No orders of the defined types were placed in this encounter.  No orders of the defined types were placed in this encounter.    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, with necrosis of bone ([place])    Diabetes mellitus type 2, uncontrolled, with complications 62/95/2841   Diabetic nephropathy ([place]) 06/26/2021   CKD (chronic kidney disease), stage III ([place]) 06/26/2021   HTN (hypertension) 06/24/2021   HIV (human immunodeficiency virus infection) ([place]) 06/24/2021   DM2 (diabetes mellitus, type 2) ([place]) 06/24/2021   HLD (hyperlipidemia) 06/24/2021   Osteomyelitis of left foot ([place]) 06/24/2021   Elevated LFTs 06/24/2021   Acute-on-chronic kidney injury ([place]) 06/24/2021   Osteomyelitis of foot ([place]) 32/44/0102   Hardware complicating wound infection ([place])    Subacute osteomyelitis, left ankle and foot ([place])    Open displaced fracture of fifth metatarsal bone of left foot    Healthcare maintenance 04/03/2019    HIV disease ([place]) 10/27/2015   DM type 2 (diabetes mellitus, type 2) ([place]) 10/27/2015   Hyperlipidemia 10/27/2015   Essential hypertension 10/27/2015   Severe vulvar dysplasia, histologically confirmed 10/15/2015   Past Medical History:  Diagnosis Date   Abnormal uterine bleeding (AUB)    Arthritis    knees, fingers   Asthma    as child, no problems in 20 yrs   Cancer ([place])    CKD (chronic kidney disease), stage II    Congenital cardiomegaly    per pt has always been told since childhood and siblings    Diabetes mellitus without complication ([place])    GERD (gastroesophageal reflux disease)    History of genital warts    HIV (human immunodeficiency virus infection) ([place])    [place]--  MONITORED BY INFECTIOUS DISEASE-- DR Carlyle Basques   HIV (human immunodeficiency virus infection) ([place])    Hypercholesterolemia    Hypertension    Intermittent palpitations    mild-- no meds   Legally blind in left eye, as defined in [place]    trauma as child   Peripheral vascular disease ([place])    Renal disorder    Tuberculosis    ? 2005 or 2007   Type 2 diabetes mellitus (HCC)    Type II    Uterine fibroid    VIN III (vulvar intraepithelial neoplasia III)    and Verrucoid lesion on the mons   Wears glasses  Family History  Problem Relation Age of Onset   Hypertension Mother    Diabetes Father    Cancer Brother    Breast cancer Cousin     Past Surgical History:  Procedure Laterality Date   CESAREAN SECTION  2001  &  1594   CO2 LASER APPLICATION N/A 58/59/2924   Procedure: CO2 LASER APPLICATION AND WIDE VOCAL EXCSION OF LESION ON MONS PUBIS;  Surgeon: Marti Sleigh, MD;  Location: [facility];  Service: Gynecology;  Laterality: N/A;   CASE CANCELLED    CO2 LASER APPLICATION N/A 46/28/6381   Procedure: CO2 LASER OF VULVAR;  Surgeon: Marti Sleigh, MD;  [facility];  Service: Gynecology;  Laterality: N/A;   CORNEAL TRANSPLANT Left  12/18/2004   failed   D & C HYSTEROSCOPY /  RESECTION FIBROID  12/18/2002   DILATATION & CURETTAGE/HYSTEROSCOPY WITH MYOSURE N/A 10/01/2019   Procedure: DILATATION & CURETTAGE/HYSTEROSCOPY WITH MYOSURE and HYDROTHERMAL ABLATION;  Surgeon: Thurnell Lose, MD;  Location: [facility];  Service: Gynecology;  Laterality: N/A;  HTA rep will be here. Confirmed on 09/25/19 CS   EYE SURGERY     FOOT ARTHRODESIS Left 11/30/2021   Procedure: LEFT TIBIOCALCANEAL FUSION;  Surgeon: Newt Minion, MD;  Location: [facility];  Service: Orthopedics;  Laterality: Left;   HARDWARE REMOVAL Left 04/04/2019   Procedure: EXCISION BONE AND REMOVE DEEP HARDWARE LEFT 5TH METATARSAL;  Surgeon: Newt Minion, MD;  Location: [facility];  Service: Orthopedics;  Laterality: Left;   I & D EXTREMITY Left 06/27/2021   Procedure: IRRIGATION AND DEBRIDEMENT FOOT ULCER;  Surgeon: Jessy Oto, MD;  Location: [facility];  Service: Orthopedics;  Laterality: Left;   KNEE ARTHROSCOPY W/ ACL RECONSTRUCTION Bilateral 12/18/2006   ORIF TOE FRACTURE Left 08/02/2017   Procedure: OPEN REDUCTION INTERNAL FIXATION (ORIF) FIFTH METATARSAL (TOE) BASE FRACTURE;  Surgeon: Wylene Simmer, MD;  Location: [facility];  Service: Orthopedics;  Laterality: Left;   VULVECTOMY N/A 01/07/2016   Procedure: WIDE LOCAL EXCISION;  Surgeon: Marti Sleigh, MD;  [facility];  Service: Gynecology;  Laterality: N/A;  mons pubis as site   Social History   Occupational History   Not on file  Tobacco Use   Smoking status: Never   Smokeless tobacco: Never  Vaping Use   Vaping Use: Never used  Substance and Sexual Activity   Alcohol use: No    Alcohol/week: 0.0 standard drinks   Drug use: No   Sexual activity: Yes    Partners: Male    Birth control/protection: None, Condom    Comment: declined condoms 03/07/21

## 2021-12-08 DIAGNOSIS — N1831 Chronic kidney disease, stage 3a: Secondary | ICD-10-CM | POA: Diagnosis not present

## 2021-12-08 DIAGNOSIS — E1165 Type 2 diabetes mellitus with hyperglycemia: Secondary | ICD-10-CM | POA: Diagnosis not present

## 2021-12-08 DIAGNOSIS — E11621 Type 2 diabetes mellitus with foot ulcer: Secondary | ICD-10-CM | POA: Diagnosis not present

## 2021-12-08 DIAGNOSIS — I10 Essential (primary) hypertension: Secondary | ICD-10-CM | POA: Diagnosis not present

## 2021-12-08 DIAGNOSIS — E7800 Pure hypercholesterolemia, unspecified: Secondary | ICD-10-CM | POA: Diagnosis not present

## 2021-12-08 DIAGNOSIS — E1142 Type 2 diabetes mellitus with diabetic polyneuropathy: Secondary | ICD-10-CM | POA: Diagnosis not present

## 2021-12-14 ENCOUNTER — Encounter: Payer: Medicare Other | Admitting: Family

## 2021-12-16 DIAGNOSIS — I10 Essential (primary) hypertension: Secondary | ICD-10-CM | POA: Diagnosis not present

## 2021-12-20 DIAGNOSIS — E1165 Type 2 diabetes mellitus with hyperglycemia: Secondary | ICD-10-CM | POA: Diagnosis not present

## 2021-12-20 DIAGNOSIS — E7800 Pure hypercholesterolemia, unspecified: Secondary | ICD-10-CM | POA: Diagnosis not present

## 2021-12-20 DIAGNOSIS — E1140 Type 2 diabetes mellitus with diabetic neuropathy, unspecified: Secondary | ICD-10-CM | POA: Diagnosis not present

## 2021-12-20 DIAGNOSIS — I10 Essential (primary) hypertension: Secondary | ICD-10-CM | POA: Diagnosis not present

## 2021-12-20 DIAGNOSIS — E11621 Type 2 diabetes mellitus with foot ulcer: Secondary | ICD-10-CM | POA: Diagnosis not present

## 2021-12-20 DIAGNOSIS — E1142 Type 2 diabetes mellitus with diabetic polyneuropathy: Secondary | ICD-10-CM | POA: Diagnosis not present

## 2021-12-20 DIAGNOSIS — N1831 Chronic kidney disease, stage 3a: Secondary | ICD-10-CM | POA: Diagnosis not present

## 2021-12-21 DIAGNOSIS — R7401 Elevation of levels of liver transaminase levels: Secondary | ICD-10-CM | POA: Diagnosis not present

## 2021-12-21 DIAGNOSIS — Z993 Dependence on wheelchair: Secondary | ICD-10-CM | POA: Diagnosis not present

## 2021-12-21 DIAGNOSIS — Z9889 Other specified postprocedural states: Secondary | ICD-10-CM | POA: Diagnosis not present

## 2021-12-21 DIAGNOSIS — R7989 Other specified abnormal findings of blood chemistry: Secondary | ICD-10-CM | POA: Diagnosis not present

## 2021-12-21 DIAGNOSIS — R634 Abnormal weight loss: Secondary | ICD-10-CM | POA: Diagnosis not present

## 2021-12-22 ENCOUNTER — Other Ambulatory Visit: Payer: Self-pay | Admitting: Gastroenterology

## 2021-12-22 DIAGNOSIS — R7989 Other specified abnormal findings of blood chemistry: Secondary | ICD-10-CM

## 2021-12-22 DIAGNOSIS — R945 Abnormal results of liver function studies: Secondary | ICD-10-CM | POA: Diagnosis not present

## 2021-12-22 DIAGNOSIS — K625 Hemorrhage of anus and rectum: Secondary | ICD-10-CM | POA: Diagnosis not present

## 2021-12-22 DIAGNOSIS — K5900 Constipation, unspecified: Secondary | ICD-10-CM | POA: Diagnosis not present

## 2021-12-23 ENCOUNTER — Ambulatory Visit (INDEPENDENT_AMBULATORY_CARE_PROVIDER_SITE_OTHER): Payer: Commercial Managed Care - HMO | Admitting: Family

## 2021-12-23 ENCOUNTER — Ambulatory Visit: Payer: Self-pay

## 2021-12-23 ENCOUNTER — Encounter: Payer: Self-pay | Admitting: Family

## 2021-12-23 DIAGNOSIS — M79672 Pain in left foot: Secondary | ICD-10-CM

## 2021-12-23 DIAGNOSIS — Z981 Arthrodesis status: Secondary | ICD-10-CM

## 2021-12-23 NOTE — Progress Notes (Signed)
Post-Op Visit Note   Patient: Katrina Wolfe           Date of Birth: 10/24/1966           MRN: [id number] Visit Date: 12/23/2021 PCP: Wenda Low, MD  Chief Complaint:  Chief Complaint  Patient presents with   Left Ankle - Routine Post Op    11/30/21 left tibial calcaneal fusion     HPI:  HPI The patient is a 56 year old woman who presents status post left tibio calcaneal fusion on December 14.  She is here today in routine follow-up she has been nonweightbearing with using a cam walker.  She denies any falls that any episodes of weightbearing on the left Ortho Exam On examination of the left lower extremity there is minimal edema the incision is well-healed there is no gaping no drainage no erythema no sign of infection  Visit Diagnoses:  1. H/O ankle fusion   2. Pain in left foot     Plan: Sutures harvested today.  The skin has healed well she will continue with dry dressing changes continue the cam walker continue nonweightbearing  Radiographs concerning for loss of reduction.  Discussed radiographs with Dr. Sharol Given  Follow-Up Instructions: No follow-ups on file.   Imaging: No results found.  Orders:  Orders Placed This Encounter  Procedures   XR Foot Complete Left   No orders of the defined types were placed in this encounter.    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) 06/26/2021   CKD (chronic kidney disease), stage III ([place]) 06/26/2021   HTN (hypertension) 06/24/2021   HIV (human immunodeficiency virus infection) ([place]) 06/24/2021   DM2 (diabetes mellitus, type 2) ([place]) 06/24/2021   HLD (hyperlipidemia) 06/24/2021   Osteomyelitis of left foot  ([place]) 06/24/2021   Elevated LFTs 06/24/2021   Acute-on-chronic kidney injury ([place]) 06/24/2021   Osteomyelitis of foot ([place]) 28/41/3244   Hardware complicating wound infection ([place])    Subacute osteomyelitis, left ankle and foot ([place])    Open displaced fracture of fifth metatarsal bone of left foot    Healthcare maintenance 04/03/2019   HIV disease ([place]) 10/27/2015   DM type 2 (diabetes mellitus, type 2) ([place]) 10/27/2015   Hyperlipidemia 10/27/2015   Essential hypertension 10/27/2015   Severe vulvar dysplasia, histologically confirmed 10/15/2015   Past Medical History:  Diagnosis Date   Abnormal uterine bleeding (AUB)    Arthritis    knees, fingers   Asthma    as child, no problems in 20 yrs   Cancer ([place])    CKD (chronic kidney disease), stage II    Congenital cardiomegaly    per pt has always been told since childhood and siblings    Diabetes mellitus without complication ([place])    GERD (gastroesophageal reflux disease)    History of genital warts    HIV (human immunodeficiency virus infection) ([place])    [place]--  MONITORED BY INFECTIOUS DISEASE-- DR Carlyle Basques   HIV (human immunodeficiency virus infection) ([place])    Hypercholesterolemia    Hypertension    Intermittent palpitations    mild-- no meds   Legally blind in left eye, as defined in [place]  trauma as child   Peripheral vascular disease ([place])    Renal disorder    Tuberculosis    ? 2005 or 2007   Type 2 diabetes mellitus ([place])    Type II    Uterine fibroid    VIN III (vulvar intraepithelial neoplasia III)    and Verrucoid lesion on the mons   Wears glasses     Family History  Problem Relation Age of Onset   Hypertension Mother    Diabetes Father    Cancer Brother    Breast cancer Cousin     Past Surgical History:  Procedure Laterality Date   CESAREAN SECTION  2001  &  2683   CO2 LASER APPLICATION N/A 41/96/2229   Procedure: CO2 LASER APPLICATION AND WIDE VOCAL EXCSION OF LESION ON MONS PUBIS;   Surgeon: Marti Sleigh, MD;  Location: [facility];  Service: Gynecology;  Laterality: N/A;   CASE CANCELLED    CO2 LASER APPLICATION N/A 79/89/2119   Procedure: CO2 LASER OF VULVAR;  Surgeon: Marti Sleigh, MD;  Location[facility];  Service: Gynecology;  Laterality: N/A;   CORNEAL TRANSPLANT Left 12/18/2004   failed   D & C HYSTEROSCOPY /  RESECTION FIBROID  12/18/2002   DILATATION & CURETTAGE/HYSTEROSCOPY WITH MYOSURE N/A 10/01/2019   Procedure: DILATATION & CURETTAGE/HYSTEROSCOPY WITH MYOSURE and HYDROTHERMAL ABLATION;  Surgeon: Thurnell Lose, MD;  Location: [facility];  Service: Gynecology;  Laterality: N/A;  HTA rep will be here. Confirmed on 09/25/19 CS   EYE SURGERY     FOOT ARTHRODESIS Left 11/30/2021   Procedure: LEFT TIBIOCALCANEAL FUSION;  Surgeon: Newt Minion, MD;  Location: [facility];  Service: Orthopedics;  Laterality: Left;   HARDWARE REMOVAL Left 04/04/2019   Procedure: EXCISION BONE AND REMOVE DEEP HARDWARE LEFT 5TH METATARSAL;  Surgeon: Newt Minion, MD;  Location: [facility];  Service: Orthopedics;  Laterality: Left;   I & D EXTREMITY Left 06/27/2021   Procedure: IRRIGATION AND DEBRIDEMENT FOOT ULCER;  Surgeon: Jessy Oto, MD;  Location: [facility];  Service: Orthopedics;  Laterality: Left;   KNEE ARTHROSCOPY W/ ACL RECONSTRUCTION Bilateral 12/18/2006   ORIF TOE FRACTURE Left 08/02/2017   Procedure: OPEN REDUCTION INTERNAL FIXATION (ORIF) FIFTH METATARSAL (TOE) BASE FRACTURE;  Surgeon: Wylene Simmer, MD;  Location: [facility];  Service: Orthopedics;  Laterality: Left;   VULVECTOMY N/A 01/07/2016   Procedure: WIDE LOCAL EXCISION;  Surgeon: Marti Sleigh, MD;  Location: [facility];  Service: Gynecology;  Laterality: N/A;  mons pubis as site   Social History   Occupational History   Not on file  Tobacco Use   Smoking status: Never   Smokeless tobacco: Never  Vaping Use    Vaping Use: Never used  Substance and Sexual Activity   Alcohol use: No    Alcohol/week: 0.0 standard drinks   Drug use: No   Sexual activity: Yes    Partners: Male    Birth control/protection: None, Condom    Comment: declined condoms 03/07/21

## 2021-12-29 ENCOUNTER — Ambulatory Visit (INDEPENDENT_AMBULATORY_CARE_PROVIDER_SITE_OTHER): Payer: Commercial Managed Care - HMO

## 2021-12-29 ENCOUNTER — Ambulatory Visit (INDEPENDENT_AMBULATORY_CARE_PROVIDER_SITE_OTHER): Payer: Commercial Managed Care - HMO | Admitting: Orthopedic Surgery

## 2021-12-29 ENCOUNTER — Other Ambulatory Visit: Payer: Self-pay

## 2021-12-29 DIAGNOSIS — Z981 Arthrodesis status: Secondary | ICD-10-CM

## 2021-12-30 ENCOUNTER — Encounter: Payer: Self-pay | Admitting: Orthopedic Surgery

## 2021-12-30 NOTE — Progress Notes (Signed)
Office Visit Note   Patient: Katrina Wolfe           Date of Birth: 24-Jul-1966           MRN: [id number] Visit Date: 12/29/2021              Requested by: Wenda Low, MD [address] [place],  [place] [address] PCP: Wenda Low, MD  Chief Complaint  Patient presents with   Left Foot - Routine Post Op    11/30/21 left tibial calcaneal fusion       HPI: Patient is a 56 year old woman who presents 4 weeks status post left tibial calcaneal fusion she has been nonweightbearing in a fracture boot.  Assessment & Plan: Visit Diagnoses:  1. H/O ankle fusion     Plan: Patient's alignment is stable without loss of reduction.  The proximal aspect of the plate that is pulled off the bone is not symptomatic at this time or causing pressure on the skin.  We will continue to follow this with repeat radiographs at follow-up.  Follow-Up Instructions: Return in about 2 weeks (around 01/12/2022).   Ortho Exam  Patient is alert, oriented, no adenopathy, well-dressed, normal affect, normal respiratory effort. Examination the lateral ulcer on her foot is almost completely healed with the realignment of her foot.  The anterior skin incision is well-healed however the plate has pulled off the bone by about 12 mm.  There are no skin compromises there is no loss of reduction of the fusion we will continue to follow this expectantly.  Imaging: XR Ankle 2 Views Left  Result Date: 12/30/2021 2 view radiographs of the left ankle shows stable internal fixation with out loss of reduction of the tibial calcaneal fusion however the proximal aspect of the plate has pulled off the bone by 12 mm.   No images are attached to the encounter.  Labs: Lab Results  Component Value Date   HGBA1C 8.2 (H) 11/30/2021   HGBA1C 8.8 (H) 06/24/2021   ESRSEDRATE 53 (H) 08/15/2021   ESRSEDRATE 74 (H) 06/30/2021   CRP 18.5 (H) 08/15/2021   CRP 0.9 06/30/2021   REPTSTATUS 07/03/2021 FINAL 06/27/2021    GRAMSTAIN  06/27/2021    RARE WBC PRESENT,BOTH PMN AND MONONUCLEAR NO ORGANISMS SEEN    CULT  06/27/2021    RARE CORYNEBACTERIUM SPECIES Standardized susceptibility testing for this organism is not available. NO ANAEROBES ISOLATED Performed at [facility], [place] [address] [place], [place] [address]    LABORGA PSEUDOMONAS AERUGINOSA 04/04/2019   LABORGA SERRATIA MARCESCENS 04/04/2019   LABORGA STAPHYLOCOCCUS AUREUS 04/04/2019     Lab Results  Component Value Date   ALBUMIN 2.8 (L) 07/02/2021   ALBUMIN 2.8 (L) 07/01/2021   ALBUMIN 2.7 (L) 06/30/2021    Lab Results  Component Value Date   MG 1.6 (L) 07/04/2021   MG 1.5 (L) 07/03/2021   MG 1.6 (L) 07/02/2021   No results found for: VD25OH  No results found for: PREALBUMIN CBC EXTENDED Latest Ref Rng & Units 11/28/2021 07/04/2021 07/03/2021  WBC 4.0 - 10.5 K/uL 11.9(H) 11.2(H) 11.5(H)  RBC 3.87 - 5.11 MIL/uL 5.27(H) 4.08 3.98  HGB 12.0 - 15.0 g/dL 15.8(H) 12.8 12.8  HCT 36.0 - 46.0 % 48.6(H) 38.7 38.2  PLT 150 - 400 K/uL 422(H) 353 344  NEUTROABS 1.7 - 7.7 K/uL - 7.0 7.8(H)  LYMPHSABS 0.7 - 4.0 K/uL - 2.8 2.2     There is no height or weight on  file to calculate BMI.  Orders:  Orders Placed This Encounter  Procedures   XR Ankle 2 Views Left   No orders of the defined types were placed in this encounter.    Procedures: No procedures performed  Clinical Data: No additional findings.  ROS:  All other systems negative, except as noted in the HPI. Review of Systems  Objective: Vital Signs: There were no vitals taken for this visit.  Specialty Comments:  No specialty comments available.  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, with necrosis of bone ([place])    Diabetes mellitus type 2, uncontrolled, with  complications 56/21/3086   Diabetic nephropathy ([place]) 06/26/2021   CKD (chronic kidney disease), stage III ([place]) 06/26/2021   HTN (hypertension) 06/24/2021   HIV (human immunodeficiency virus infection) ([place]) 06/24/2021   DM2 (diabetes mellitus, type 2) ([place]) 06/24/2021   HLD (hyperlipidemia) 06/24/2021   Osteomyelitis of left foot ([place]) 06/24/2021   Elevated LFTs 06/24/2021   Acute-on-chronic kidney injury ([place]) 06/24/2021   Osteomyelitis of foot ([place]) 57/84/6962   Hardware complicating wound infection ([place])    Subacute osteomyelitis, left ankle and foot ([place])    Open displaced fracture of fifth metatarsal bone of left foot    Healthcare maintenance 04/03/2019   HIV disease ([place]) 10/27/2015   DM type 2 (diabetes mellitus, type 2) ([place]) 10/27/2015   Hyperlipidemia 10/27/2015   Essential hypertension 10/27/2015   Severe vulvar dysplasia, histologically confirmed 10/15/2015   Past Medical History:  Diagnosis Date   Abnormal uterine bleeding (AUB)    Arthritis    knees, fingers   Asthma    as child, no problems in 20 yrs   Cancer ([place])    CKD (chronic kidney disease), stage II    Congenital cardiomegaly    per pt has always been told since childhood and siblings    Diabetes mellitus without complication ([place])    GERD (gastroesophageal reflux disease)    History of genital warts    HIV (human immunodeficiency virus infection) ([place])    [place]--  MONITORED BY INFECTIOUS DISEASE-- DR Carlyle Basques   HIV (human immunodeficiency virus infection) ([place])    Hypercholesterolemia    Hypertension    Intermittent palpitations    mild-- no meds   Legally blind in left eye, as defined in [place]    trauma as child   Peripheral vascular disease ([place])    Renal disorder    Tuberculosis    ? 2005 or 2007   Type 2 diabetes mellitus ([place])    Type II    Uterine fibroid    VIN III (vulvar intraepithelial neoplasia III)    and Verrucoid lesion on the mons   Wears glasses     Family History   Problem Relation Age of Onset   Hypertension Mother    Diabetes Father    Cancer Brother    Breast cancer Cousin     Past Surgical History:  Procedure Laterality Date   CESAREAN SECTION  2001  &  9528   CO2 LASER APPLICATION N/A 41/32/4401   Procedure: CO2 LASER APPLICATION AND WIDE VOCAL EXCSION OF LESION ON MONS PUBIS;  Surgeon: Marti Sleigh, MD;  Location: [facility];  Service: Gynecology;  Laterality: N/A;   CASE CANCELLED    CO2 LASER APPLICATION  N/A 01/07/2016   Procedure: CO2 LASER OF VULVAR;  Surgeon: Marti Sleigh, MD;  Location: [facility];  Service: Gynecology;  Laterality: N/A;   CORNEAL TRANSPLANT Left 12/18/2004   failed   D & C HYSTEROSCOPY /  RESECTION FIBROID  12/18/2002   DILATATION & CURETTAGE/HYSTEROSCOPY WITH MYOSURE N/A 10/01/2019   Procedure: DILATATION & CURETTAGE/HYSTEROSCOPY WITH MYOSURE and HYDROTHERMAL ABLATION;  Surgeon: Thurnell Lose, MD;  Location: [facility];  Service: Gynecology;  Laterality: N/A;  HTA rep will be here. Confirmed on 09/25/19 CS   EYE SURGERY     FOOT ARTHRODESIS Left 11/30/2021   Procedure: LEFT TIBIOCALCANEAL FUSION;  Surgeon: Newt Minion, MD;  Location: [facility];  Service: Orthopedics;  Laterality: Left;   HARDWARE REMOVAL Left 04/04/2019   Procedure: EXCISION BONE AND REMOVE DEEP HARDWARE LEFT 5TH METATARSAL;  Surgeon: Newt Minion, MD;  Location: [facility];  Service: Orthopedics;  Laterality: Left;   I & D EXTREMITY Left 06/27/2021   Procedure: IRRIGATION AND DEBRIDEMENT FOOT ULCER;  Surgeon: Jessy Oto, MD;  Location: [facility];  Service: Orthopedics;  Laterality: Left;   KNEE ARTHROSCOPY W/ ACL RECONSTRUCTION Bilateral 12/18/2006   ORIF TOE FRACTURE Left 08/02/2017   Procedure: OPEN REDUCTION INTERNAL FIXATION (ORIF) FIFTH METATARSAL (TOE) BASE FRACTURE;  Surgeon: Wylene Simmer, MD;  Location: [facility];  Service: Orthopedics;  Laterality: Left;    VULVECTOMY N/A 01/07/2016   Procedure: WIDE LOCAL EXCISION;  Surgeon: Marti Sleigh, MD;  Location: [facility];  Service: Gynecology;  Laterality: N/A;  mons pubis as site   Social History   Occupational History   Not on file  Tobacco Use   Smoking status: Never   Smokeless tobacco: Never  Vaping Use   Vaping Use: Never used  Substance and Sexual Activity   Alcohol use: No    Alcohol/week: 0.0 standard drinks   Drug use: No   Sexual activity: Yes    Partners: Male    Birth control/protection: None, Condom    Comment: declined condoms 03/07/21

## 2022-01-03 ENCOUNTER — Other Ambulatory Visit: Payer: Commercial Managed Care - HMO

## 2022-01-04 ENCOUNTER — Other Ambulatory Visit: Payer: Self-pay

## 2022-01-04 ENCOUNTER — Ambulatory Visit
Admission: RE | Admit: 2022-01-04 | Discharge: 2022-01-04 | Disposition: A | Discharge status: Home / Self Care | Payer: Commercial Managed Care - HMO | Source: Ambulatory Visit | Attending: Gastroenterology | Admitting: Gastroenterology

## 2022-01-04 DIAGNOSIS — R945 Abnormal results of liver function studies: Secondary | ICD-10-CM | POA: Diagnosis not present

## 2022-01-04 DIAGNOSIS — K828 Other specified diseases of gallbladder: Secondary | ICD-10-CM | POA: Diagnosis not present

## 2022-01-04 DIAGNOSIS — K8020 Calculus of gallbladder without cholecystitis without obstruction: Secondary | ICD-10-CM | POA: Diagnosis not present

## 2022-01-04 DIAGNOSIS — R7989 Other specified abnormal findings of blood chemistry: Secondary | ICD-10-CM

## 2022-01-04 IMAGING — US US ABDOMEN LIMITED
1 series · 14 of 25 positions shown · non-contrast
Comparison: Renal ultrasound [DATE] right upper quadrant
abdominal ultrasound [DATE]

CLINICAL DATA: Elevated LFTs.

EXAM:
ULTRASOUND ABDOMEN LIMITED RIGHT UPPER QUADRANT

[Series 1: us abdomen limited · 0.20mm/px · 14 of 47 slices shown]
[im 1/47]
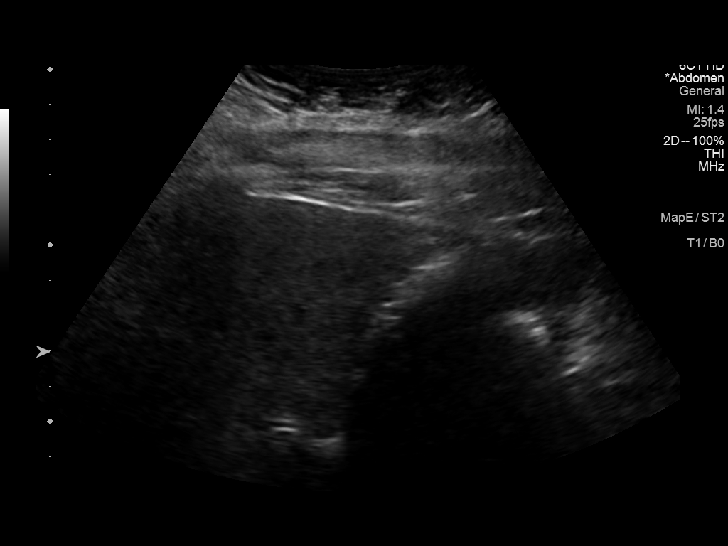
[im 4/47]
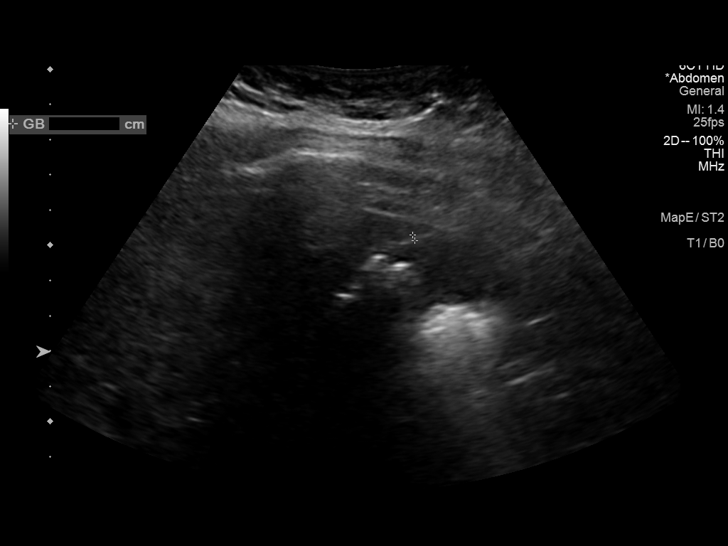
[im 8/47]
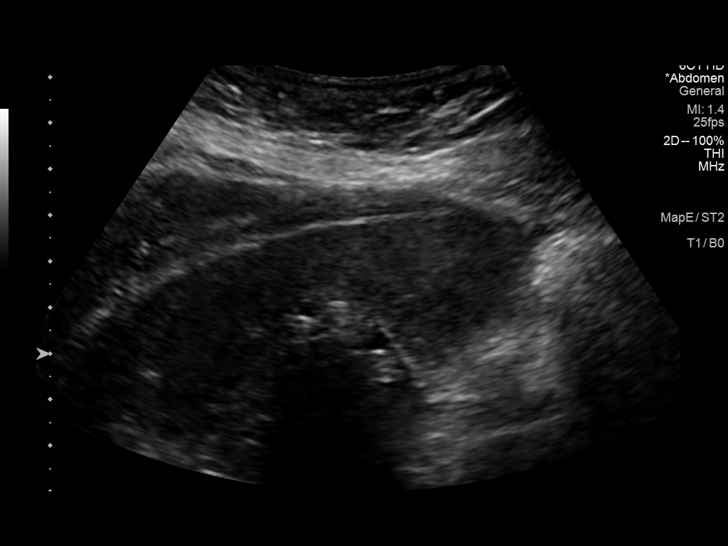
[im 12/47]
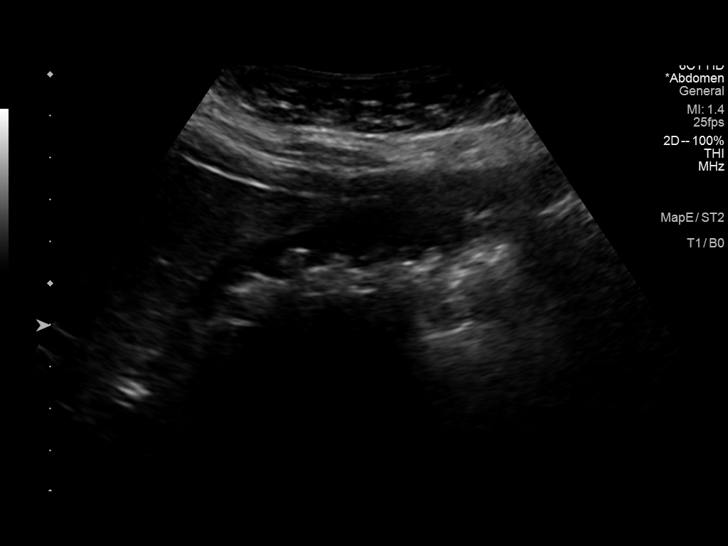
[im 16/47]
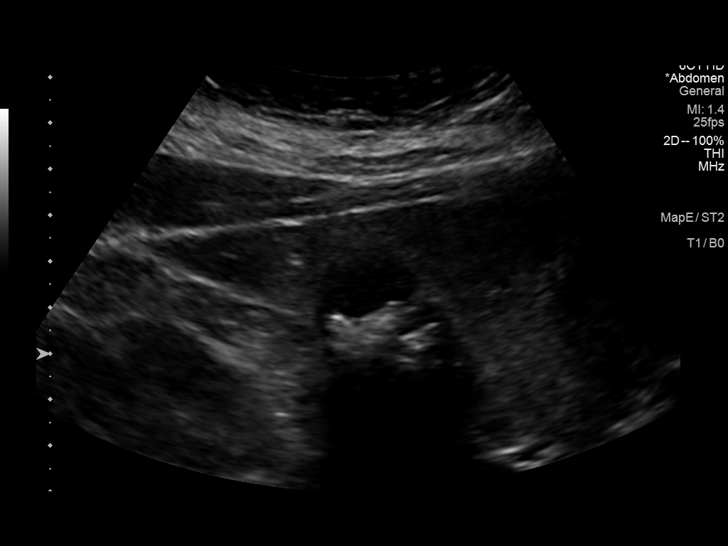
[im 18/47]
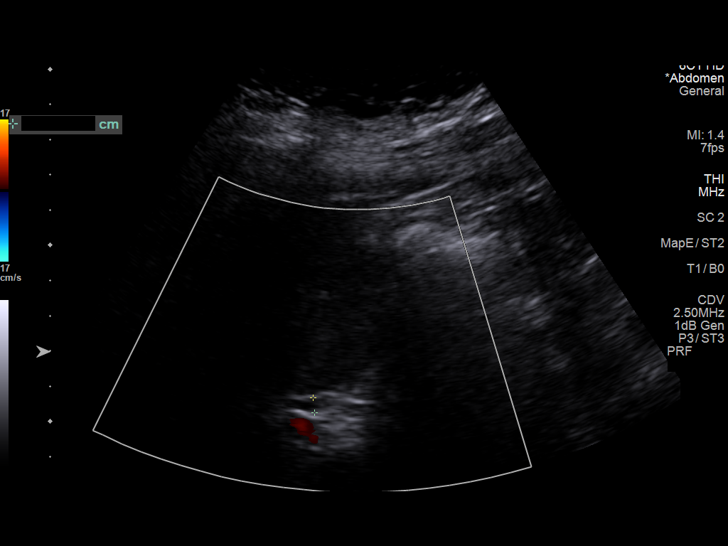
[im 22/47]
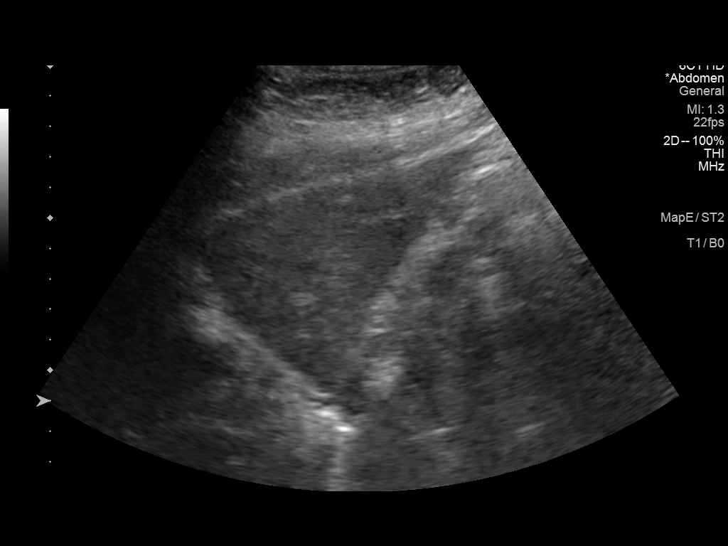
[im 25/47]
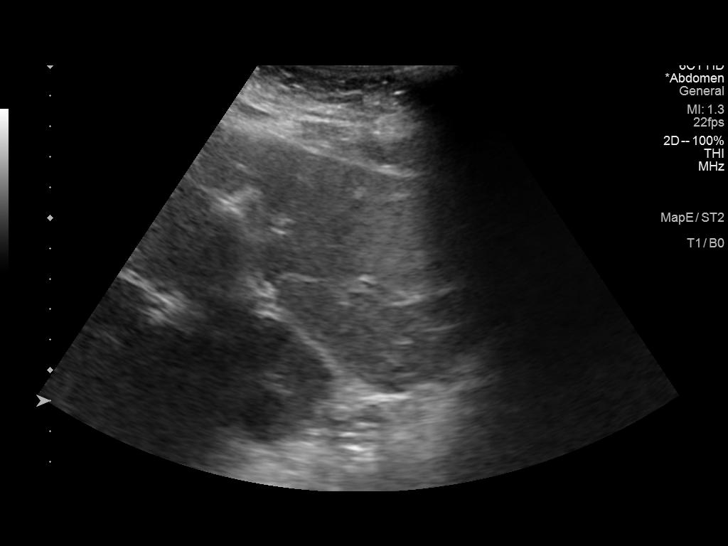
[im 29/47]
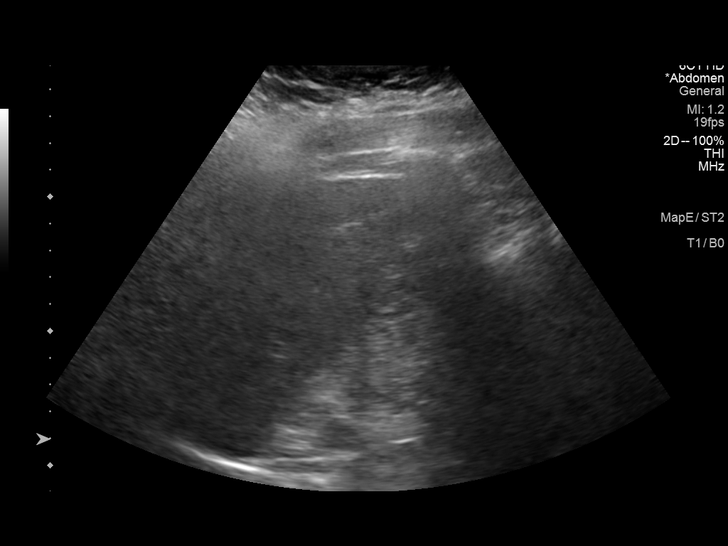
[im 31/47]
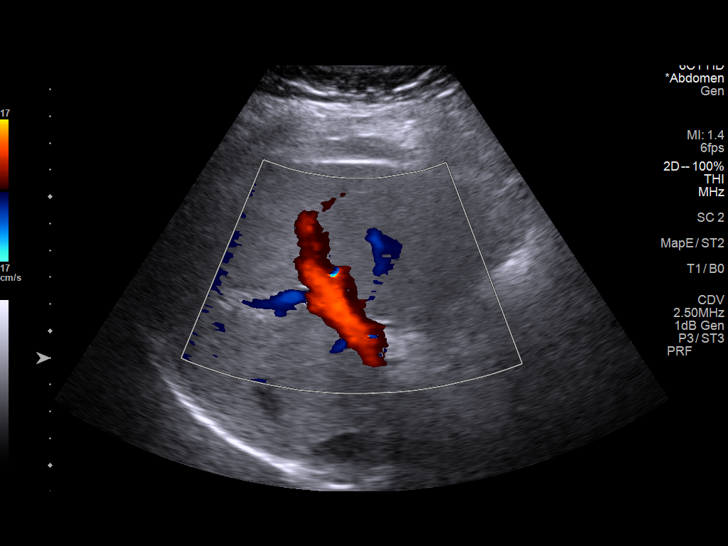
[im 35/47]
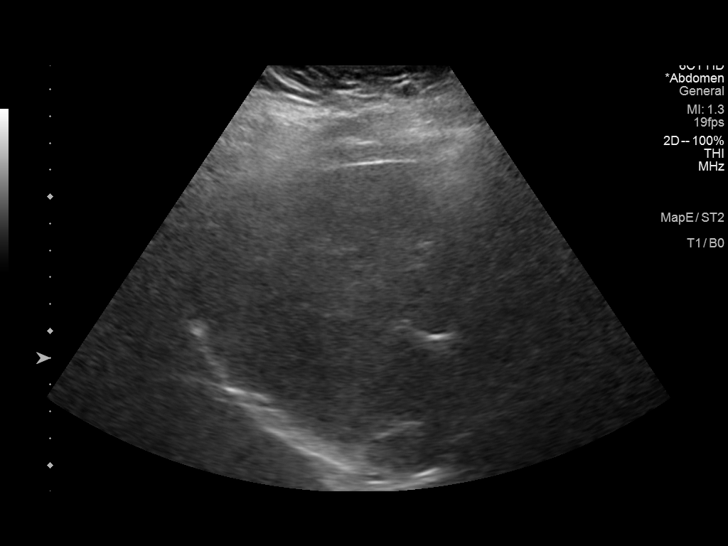
[im 39/47]
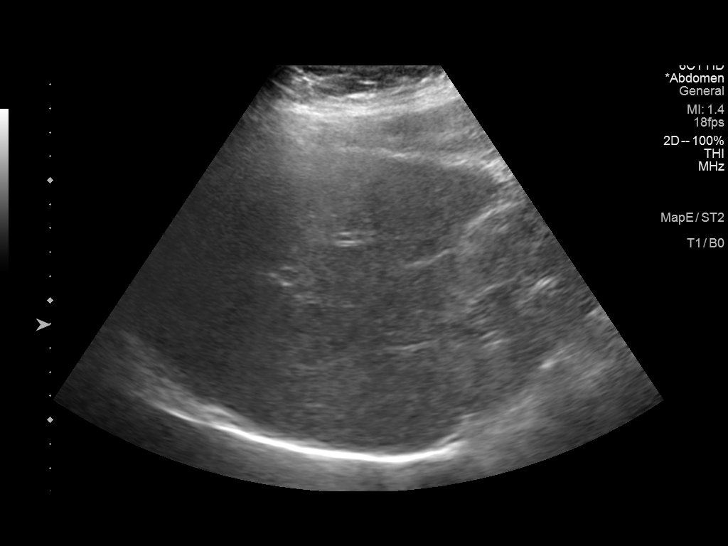
[im 43/47]
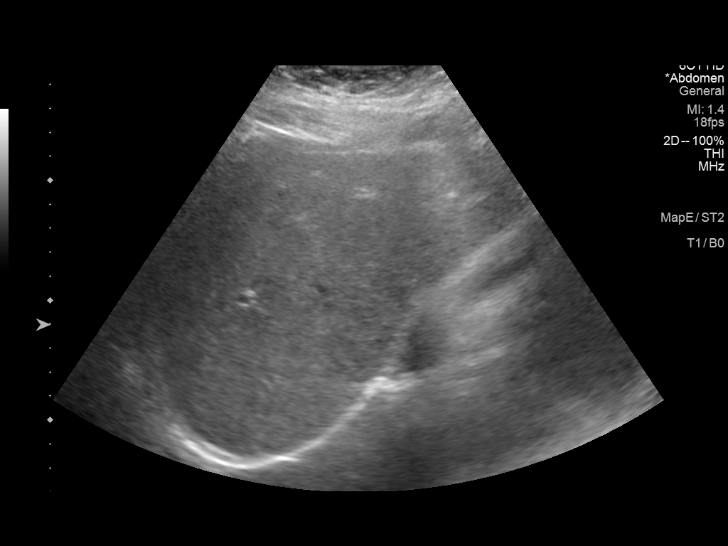
[im 47/47]
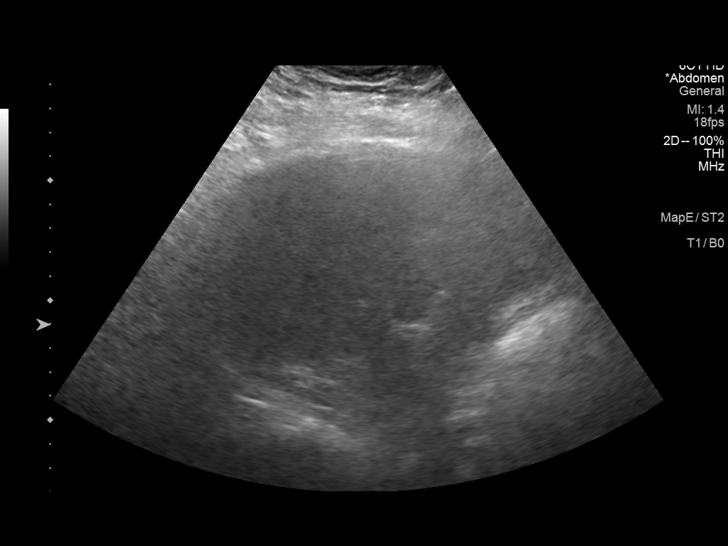

[14 of 25 positions shown; findings below may reference images not displayed]

FINDINGS: Gallbladder:

Multiple gallstones are again seen within a contracted gallbladder.
The largest appears to measure 9 mm. No sonographic Murphy's sign.
Normal gallbladder wall thickness of 2 mm. No pericholecystic fluid.

Common bile duct:

Diameter: 4 mm.

Liver:

No focal lesion identified. Increased parenchymal echogenicity
throughout the liver suggesting fatty infiltration, increased from
prior. Portal vein is patent on color Doppler imaging with normal
direction of blood flow towards the liver.

Other: None.
IMPRESSION: :
IMPRESSION: 1. Redemonstration of gallstone filled gallbladder. No sonographic
evidence of acute cholecystitis.
2. Increased echogenicity of the liver compared to prior. This may
be secondary to fatty infiltration.

## 2022-01-16 ENCOUNTER — Other Ambulatory Visit: Payer: Self-pay | Admitting: Internal Medicine

## 2022-01-16 DIAGNOSIS — B20 Human immunodeficiency virus [HIV] disease: Secondary | ICD-10-CM

## 2022-01-17 ENCOUNTER — Ambulatory Visit (INDEPENDENT_AMBULATORY_CARE_PROVIDER_SITE_OTHER): Payer: Medicare Other

## 2022-01-17 ENCOUNTER — Ambulatory Visit (INDEPENDENT_AMBULATORY_CARE_PROVIDER_SITE_OTHER): Payer: Medicare Other | Admitting: Orthopedic Surgery

## 2022-01-17 ENCOUNTER — Other Ambulatory Visit: Payer: Self-pay

## 2022-01-17 DIAGNOSIS — Z981 Arthrodesis status: Secondary | ICD-10-CM

## 2022-01-17 DIAGNOSIS — M25572 Pain in left ankle and joints of left foot: Secondary | ICD-10-CM

## 2022-01-18 ENCOUNTER — Encounter: Payer: Self-pay | Admitting: Orthopedic Surgery

## 2022-01-18 NOTE — Progress Notes (Signed)
Office Visit Note   Patient: Katrina Wolfe           Date of Birth: [birth year]           MRN: [id number] Visit Date: 01/17/2022              Requested by: Wenda Low, MD [address] [place],  [place] [address] PCP: Wenda Low, MD  Chief Complaint  Patient presents with   Left Ankle - Routine Post Op    11/30/2021 left tibial calcaneal fusion       HPI: Patient is a 56 year old woman who is 6 weeks status post left tibial calcaneal fusion.  She has had anterior displacement of the tibial plate.  She is currently nonweightbearing with a wheelchair and a fracture boot.  She feels well.  Assessment & Plan: Visit Diagnoses:  1. Pain in left ankle and joints of left foot   2. H/O ankle fusion     Plan: Continue with protected weightbearing.  Repeat three-view radiographs of the left ankle at follow-up.  Follow-Up Instructions: Return in about 3 weeks (around 02/07/2022).   Ortho Exam  Patient is alert, oriented, no adenopathy, well-dressed, normal affect, normal respiratory effort. Examination there is no redness no cellulitis no signs of infection there is no skin breakdown over the plate.  Her foot is plantigrade.  The ulcer over the base of the fifth metatarsal has completely healed with the realignment of her foot.  Imaging: No results found. No images are attached to the encounter.  Labs: Lab Results  Component Value Date   HGBA1C 8.2 (H) 11/30/2021   HGBA1C 8.8 (H) 06/24/2021   ESRSEDRATE 53 (H) 08/15/2021   ESRSEDRATE 74 (H) 06/30/2021   CRP 18.5 (H) 08/15/2021   CRP 0.9 06/30/2021   REPTSTATUS 07/03/2021 FINAL 06/27/2021   GRAMSTAIN  06/27/2021    RARE WBC PRESENT,BOTH PMN AND MONONUCLEAR NO ORGANISMS SEEN    CULT  06/27/2021    RARE CORYNEBACTERIUM SPECIES Standardized susceptibility testing for this organism is not available. NO ANAEROBES ISOLATED Performed at [facility], [place] [address], [place], [place] [address]     LABORGA PSEUDOMONAS AERUGINOSA 04/04/2019   LABORGA SERRATIA MARCESCENS 04/04/2019   LABORGA STAPHYLOCOCCUS AUREUS 04/04/2019     Lab Results  Component Value Date   ALBUMIN 2.8 (L) 07/02/2021   ALBUMIN 2.8 (L) 07/01/2021   ALBUMIN 2.7 (L) 06/30/2021    Lab Results  Component Value Date   MG 1.6 (L) 07/04/2021   MG 1.5 (L) 07/03/2021   MG 1.6 (L) 07/02/2021   No results found for: VD25OH  No results found for: PREALBUMIN CBC EXTENDED Latest Ref Rng & Units 11/28/2021 07/04/2021 07/03/2021  WBC 4.0 - 10.5 K/uL 11.9(H) 11.2(H) 11.5(H)  RBC 3.87 - 5.11 MIL/uL 5.27(H) 4.08 3.98  HGB 12.0 - 15.0 g/dL 15.8(H) 12.8 12.8  HCT 36.0 - 46.0 % 48.6(H) 38.7 38.2  PLT 150 - 400 K/uL 422(H) 353 344  NEUTROABS 1.7 - 7.7 K/uL - 7.0 7.8(H)  LYMPHSABS 0.7 - 4.0 K/uL - 2.8 2.2     There is no height or weight on file to calculate BMI.  Orders:  Orders Placed This Encounter  Procedures   XR Ankle 2 Views Left   No orders of the defined types were placed in this encounter.    Procedures: No procedures performed  Clinical Data: No additional findings.  ROS:  All other systems negative, except as noted in the HPI. Review  of Systems  Objective: Vital Signs: There were no vitals taken for this visit.  Specialty Comments:  No specialty comments available.  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, with necrosis of bone ([place])    Diabetes mellitus type 2, uncontrolled, with complications 02/58/5277   Diabetic nephropathy ([place]) 06/26/2021   CKD (chronic kidney disease), stage III ([place]) 06/26/2021   HTN (hypertension) 06/24/2021   HIV (human immunodeficiency virus infection) ([place]) 06/24/2021   DM2 (diabetes mellitus, type 2) ([place]) 06/24/2021   HLD (hyperlipidemia) 06/24/2021   Osteomyelitis of  left foot ([place]) 06/24/2021   Elevated LFTs 06/24/2021   Acute-on-chronic kidney injury ([place]) 06/24/2021   Osteomyelitis of foot ([place]) 82/42/3536   Hardware complicating wound infection ([place])    Subacute osteomyelitis, left ankle and foot ([place])    Open displaced fracture of fifth metatarsal bone of left foot    Healthcare maintenance 04/03/2019   HIV disease ([place]) 10/27/2015   DM type 2 (diabetes mellitus, type 2) ([place]) 10/27/2015   Hyperlipidemia 10/27/2015   Essential hypertension 10/27/2015   Severe vulvar dysplasia, histologically confirmed 10/15/2015   Past Medical History:  Diagnosis Date   Abnormal uterine bleeding (AUB)    Arthritis    knees, fingers   Asthma    as child, no problems in 20 yrs   Cancer ([place])    CKD (chronic kidney disease), stage II    Congenital cardiomegaly    per pt has always been told since childhood and siblings    Diabetes mellitus without complication ([place])    GERD (gastroesophageal reflux disease)    History of genital warts    HIV (human immunodeficiency virus infection) ([place])    [place]--  MONITORED BY INFECTIOUS DISEASE-- DR Carlyle Basques   HIV (human immunodeficiency virus infection) ([place])    Hypercholesterolemia    Hypertension    Intermittent palpitations    mild-- no meds   Legally blind in left eye, as defined in [place]    trauma as child   Peripheral vascular disease ([place])    Renal disorder    Tuberculosis    ? 2005 or 2007   Type 2 diabetes mellitus ([place])    Type II    Uterine fibroid    VIN III (vulvar intraepithelial neoplasia III)    and Verrucoid lesion on the mons   Wears glasses     Family History  Problem Relation Age of Onset   Hypertension Mother    Diabetes Father    Cancer Brother    Breast cancer Cousin     Past Surgical History:  Procedure Laterality Date   CESAREAN SECTION  2001  &  1443   CO2 LASER APPLICATION N/A 15/40/0867   Procedure: CO2 LASER APPLICATION AND WIDE VOCAL EXCSION OF LESION ON MONS  PUBIS;  Surgeon: Marti Sleigh, MD;  Location: [facility];  Service: Gynecology;  Laterality: N/A;   CASE CANCELLED    CO2 LASER APPLICATION N/A 61/95/0932   Procedure: CO2 LASER OF VULVAR;  Surgeon: Marti Sleigh, MD;  Location: [facility];  Service: Gynecology;  Laterality: N/A;   CORNEAL TRANSPLANT Left 12/18/2004   failed   D & C HYSTEROSCOPY /  RESECTION FIBROID  12/18/2002   DILATATION & CURETTAGE/HYSTEROSCOPY WITH MYOSURE N/A 10/01/2019   Procedure: DILATATION &  CURETTAGE/HYSTEROSCOPY WITH MYOSURE and HYDROTHERMAL ABLATION;  Surgeon: Thurnell Lose, MD;  Location: [facility];  Service: Gynecology;  Laterality: N/A;  HTA rep will be here. Confirmed on 09/25/19 CS   EYE SURGERY     FOOT ARTHRODESIS Left 11/30/2021   Procedure: LEFT TIBIOCALCANEAL FUSION;  Surgeon: Newt Minion, MD;  Location: [facility];  Service: Orthopedics;  Laterality: Left;   HARDWARE REMOVAL Left 04/04/2019   Procedure: EXCISION BONE AND REMOVE DEEP HARDWARE LEFT 5TH METATARSAL;  Surgeon: Newt Minion, MD;  Location: [facility];  Service: Orthopedics;  Laterality: Left;   I & D EXTREMITY Left 06/27/2021   Procedure: IRRIGATION AND DEBRIDEMENT FOOT ULCER;  Surgeon: Jessy Oto, MD;  Location: [facility];  Service: Orthopedics;  Laterality: Left;   KNEE ARTHROSCOPY W/ ACL RECONSTRUCTION Bilateral 12/18/2006   ORIF TOE FRACTURE Left 08/02/2017   Procedure: OPEN REDUCTION INTERNAL FIXATION (ORIF) FIFTH METATARSAL (TOE) BASE FRACTURE;  Surgeon: Wylene Simmer, MD;  Location: [facility];  Service: Orthopedics;  Laterality: Left;   VULVECTOMY N/A 01/07/2016   Procedure: WIDE LOCAL EXCISION;  Surgeon: Marti Sleigh, MD;  Location: [facility];  Service: Gynecology;  Laterality: N/A;  mons pubis as site   Social History   Occupational History   Not on file  Tobacco Use   Smoking status: Never   Smokeless tobacco: Never  Vaping  Use   Vaping Use: Never used  Substance and Sexual Activity   Alcohol use: No    Alcohol/week: 0.0 standard drinks   Drug use: No   Sexual activity: Yes    Partners: Male    Birth control/protection: None, Condom    Comment: declined condoms 03/07/21

## 2022-01-27 DIAGNOSIS — K6282 Dysplasia of anus: Secondary | ICD-10-CM | POA: Diagnosis not present

## 2022-01-27 DIAGNOSIS — K6289 Other specified diseases of anus and rectum: Secondary | ICD-10-CM | POA: Diagnosis not present

## 2022-01-27 DIAGNOSIS — R85612 Low grade squamous intraepithelial lesion on cytologic smear of anus (LGSIL): Secondary | ICD-10-CM | POA: Diagnosis not present

## 2022-01-27 DIAGNOSIS — K625 Hemorrhage of anus and rectum: Secondary | ICD-10-CM | POA: Diagnosis not present

## 2022-02-01 DIAGNOSIS — E1122 Type 2 diabetes mellitus with diabetic chronic kidney disease: Secondary | ICD-10-CM | POA: Diagnosis not present

## 2022-02-01 DIAGNOSIS — I129 Hypertensive chronic kidney disease with stage 1 through stage 4 chronic kidney disease, or unspecified chronic kidney disease: Secondary | ICD-10-CM | POA: Diagnosis not present

## 2022-02-01 DIAGNOSIS — N1831 Chronic kidney disease, stage 3a: Secondary | ICD-10-CM | POA: Diagnosis not present

## 2022-02-03 DIAGNOSIS — R85612 Low grade squamous intraepithelial lesion on cytologic smear of anus (LGSIL): Secondary | ICD-10-CM | POA: Diagnosis not present

## 2022-02-07 ENCOUNTER — Ambulatory Visit (INDEPENDENT_AMBULATORY_CARE_PROVIDER_SITE_OTHER): Payer: Medicare Other

## 2022-02-07 ENCOUNTER — Ambulatory Visit (INDEPENDENT_AMBULATORY_CARE_PROVIDER_SITE_OTHER): Payer: Medicare Other | Admitting: Orthopedic Surgery

## 2022-02-07 ENCOUNTER — Other Ambulatory Visit: Payer: Self-pay

## 2022-02-07 DIAGNOSIS — Z981 Arthrodesis status: Secondary | ICD-10-CM

## 2022-02-09 ENCOUNTER — Other Ambulatory Visit: Payer: Self-pay

## 2022-02-09 ENCOUNTER — Ambulatory Visit (INDEPENDENT_AMBULATORY_CARE_PROVIDER_SITE_OTHER): Payer: Medicare Other | Admitting: Internal Medicine

## 2022-02-09 VITALS — BP 118/83 | HR 83 | Resp 16 | Ht 69.5 in | Wt 267.0 lb

## 2022-02-09 DIAGNOSIS — B20 Human immunodeficiency virus [HIV] disease: Secondary | ICD-10-CM | POA: Diagnosis not present

## 2022-02-09 DIAGNOSIS — N183 Chronic kidney disease, stage 3 unspecified: Secondary | ICD-10-CM | POA: Diagnosis not present

## 2022-02-09 DIAGNOSIS — R7989 Other specified abnormal findings of blood chemistry: Secondary | ICD-10-CM

## 2022-02-09 DIAGNOSIS — Z0000 Encounter for general adult medical examination without abnormal findings: Secondary | ICD-10-CM

## 2022-02-09 MED ORDER — DOVATO 50-300 MG PO TABS: 1.0000 | ORAL_TABLET | Freq: Every day | ORAL | 11 refills | Status: DC

## 2022-02-09 NOTE — Progress Notes (Signed)
RFV: follow up for hiv disease  Patient ID: Katrina Wolfe, female   DOB: July 12, 1966, 56 y.o.   MRN: [id number]  HPI 56yo F with well controlled hiv disease, DM, obesity. She continues to follow up with her PCP regarding elevated transaminitis. she Had recent colonoscopy by dr Collene Mares. Awaiting path results to see what are next steps Took her off of cholesterol medication - in case related to elevated AST/ALT. Suspect it is fatty liver.  Has seen [place] [facility] due to proteinuira and elevated Cr. Gave dietary changes and monitor  Soc hx: Separated from husband nov 3rd 2022. Still cares for her autistic son.   Hiv med hx: 23yo F with HIV disease, CD 4 count of 500/VL 553 ( oct 2016) on atripla. Also hx of HTN, DM, HLD.  Dr. Laney Potash, [place] followed her HIV disease. Dx 1998. Treated since 1999. Previously on kaletra/combivir then switched in 2010 to [place]. Did run out of her atripla in the month august. Restarted on her [place] in September as dispensed by Dr. Deforest Hoyles. - was briefly on genvoya then switched to biktarvy since 2018.  Outpatient Encounter Medications as of 02/09/2022  Medication Sig   acetaminophen (TYLENOL) 500 MG tablet Take 1,000 mg by mouth every 6 (six) hours as needed for moderate pain.   amLODipine (NORVASC) 5 MG tablet Take 5 mg by mouth in the morning.   aspirin EC 81 MG tablet Take 1 tablet (81 mg total) by mouth 2 (two) times daily. (Patient taking differently: Take 81 mg by mouth daily.)   atorvastatin (LIPITOR) 10 MG tablet Take 10 mg by mouth daily.   bictegravir-emtricitabine-tenofovir AF (BIKTARVY) 50-200-25 MG TABS tablet TAKE ONE TABLET BY MOUTH EVERY EVENING   calcium carbonate (TUMS - DOSED IN MG ELEMENTAL CALCIUM) 500 MG chewable tablet Chew 1 tablet by mouth as needed for indigestion or heartburn.   Calcium Carbonate-Vitamin D 600-200 MG-UNIT TABS Take 1 tablet by mouth daily.   COMFORT EZ PEN NEEDLES 31G X 8 MM MISC USE WITH insulin THREE TIMES  DAILY AS DIRECTED   diphenhydrAMINE (BENADRYL) 25 MG tablet Take 25 mg by mouth every 6 (six) hours as needed for allergies (or allergic reactions).   FARXIGA 10 MG TABS tablet Take 10 mg by mouth in the morning.   ferrous sulfate 325 (65 FE) MG EC tablet Take 325 mg by mouth daily with breakfast.   gabapentin (NEURONTIN) 100 MG capsule Take 100 mg by mouth 4 (four) times daily.   glimepiride (AMARYL) 2 MG tablet Take 2 mg by mouth See admin instructions. Take 4 mg by mouth in the morning and 2 mg in the evening   HUMALOG KWIKPEN 100 UNIT/ML KwikPen Inject 10-15 Units into the skin daily with lunch.   HUMALOG MIX 75/25 KWIKPEN (75-25) 100 UNIT/ML Kwikpen Inject 60 Units into the skin 2 (two) times daily.   HYDROcodone-acetaminophen (NORCO/VICODIN) 5-325 MG tablet Take 1 tablet by mouth every 4 (four) hours as needed.   Insulin Syringe-Needle U-100 (INSULIN SYRINGE 1CC/31GX5/16") 31G X 5/16" 1 ML MISC 1 Units by Does not apply route 2 (two) times daily.   Lancets (ONETOUCH ULTRASOFT) lancets    lisinopril-hydrochlorothiazide (ZESTORETIC) 20-25 MG tablet Take 1 tablet by mouth daily.   methocarbamol (ROBAXIN) 500 MG tablet Take 1 tablet (500 mg total) by mouth every 8 (eight) hours as needed for muscle spasms.   norethindrone (AYGESTIN) 5 MG tablet Take 5 mg by mouth in the morning.   ONE TOUCH ULTRA TEST test  strip    traMADol (ULTRAM) 50 MG tablet Take 50 mg by mouth every 4 (four) hours as needed for moderate pain.   TRULICITY 3 XL/2.4MW SOPN SMARTSIG:1 Pre-Filled Pen Syringe SUB-Q Once a Week   No facility-administered encounter medications on file as of 02/09/2022.     Patient Active Problem List   Diagnosis Date Noted   S/P ankle fusion 11/30/2021   Charcot's joint, left ankle and foot    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, with necrosis of bone ([place])    Diabetes mellitus type 2, uncontrolled, with complications 10/14/2535   Diabetic  nephropathy ([place]) 06/26/2021   CKD (chronic kidney disease), stage III ([place]) 06/26/2021   HTN (hypertension) 06/24/2021   HIV (human immunodeficiency virus infection) ([place]) 06/24/2021   DM2 (diabetes mellitus, type 2) ([place]) 06/24/2021   HLD (hyperlipidemia) 06/24/2021   Osteomyelitis of left foot ([place]) 06/24/2021   Elevated LFTs 06/24/2021   Acute-on-chronic kidney injury ([place]) 06/24/2021   Osteomyelitis of foot ([place]) 64/40/3474   Hardware complicating wound infection ([place])    Subacute osteomyelitis, left ankle and foot ([place])    Open displaced fracture of fifth metatarsal bone of left foot    Healthcare maintenance 04/03/2019   HIV disease ([place]) 10/27/2015   DM type 2 (diabetes mellitus, type 2) ([place]) 10/27/2015   Hyperlipidemia 10/27/2015   Essential hypertension 10/27/2015   Severe vulvar dysplasia, histologically confirmed 10/15/2015     Health Maintenance Due  Topic Date Due   COVID-19 Vaccine (1) Never done   FOOT EXAM  Never done   OPHTHALMOLOGY EXAM  Never done   Zoster Vaccines- Shingrix (1 of 2) Never done   COLONOSCOPY (Pts 45-53yrs Insurance coverage will need to be confirmed)  Never done   MAMMOGRAM  10/12/2021   PAP SMEAR-Modifier  01/07/2022     Review of Systems Review of Systems  Constitutional: Negative for fever, chills, diaphoresis, activity change, appetite change, fatigue and unexpected weight change.  HENT: Negative for congestion, sore throat, rhinorrhea, sneezing, trouble swallowing and sinus pressure.  Eyes: Negative for photophobia and visual disturbance.  Respiratory: Negative for cough, chest tightness, shortness of breath, wheezing and stridor.  Cardiovascular: Negative for chest pain, palpitations and leg swelling.  Gastrointestinal: Negative for nausea, vomiting, abdominal pain, diarrhea, constipation, blood in stool, abdominal distention and anal bleeding.  Genitourinary: Negative for dysuria, hematuria, flank pain and difficulty urinating.   Musculoskeletal: Negative for myalgias, back pain, joint swelling, arthralgias and gait problem.  Skin: Negative for color change, pallor, rash and wound.  Neurological: Negative for dizziness, tremors, weakness and light-headedness.  Hematological: Negative for adenopathy. Does not bruise/bleed easily.  Psychiatric/Behavioral: Negative for behavioral problems, confusion, sleep disturbance, dysphoric mood, decreased concentration and agitation.   Physical Exam   BP 118/83    Pulse 83    Resp 16    Ht 5' 9.5" (1.765 m)    Wt 267 lb (121.1 kg)    SpO2 97%    BMI 38.86 kg/m   Physical Exam  Constitutional:  oriented to person, place, and time. appears well-developed and well-nourished. No distress.  HENT: [place]/AT, PERRLA, no scleral icterus Mouth/Throat: Oropharynx is clear and moist. No oropharyngeal exudate.  Cardiovascular: Normal rate, regular rhythm and normal heart sounds. Exam reveals no gallop and no friction rub.  No murmur heard.  Pulmonary/Chest: Effort normal and breath sounds normal. No respiratory distress.  has no wheezes.  Neck = supple, no nuchal rigidity Abdominal: Soft. Bowel  sounds are normal.  exhibits no distension. There is no tenderness.  Lymphadenopathy: no cervical adenopathy. No axillary adenopathy Neurological: alert and oriented to person, place, and time.  Skin: Skin is warm and dry. No rash noted. No erythema.  Psychiatric: a normal mood and affect.  behavior is normal.   Lab Results  Component Value Date   CD4TCELL 25 (L) 05/19/2021   Lab Results  Component Value Date   CD4TABS 638 05/19/2021   CD4TABS 654 02/22/2021   CD4TABS 660 07/06/2020   Lab Results  Component Value Date   HIV1RNAQUANT Not Detected 05/19/2021   Lab Results  Component Value Date   HEPBSAB NEG 10/13/2015   Lab Results  Component Value Date   LABRPR NON-REACTIVE 02/22/2021    CBC Lab Results  Component Value Date   WBC 11.9 (H) 11/28/2021   RBC 5.27 (H) 11/28/2021    HGB 15.8 (H) 11/28/2021   HCT 48.6 (H) 11/28/2021   PLT 422 (H) 11/28/2021   MCV 92.2 11/28/2021   MCH 30.0 11/28/2021   MCHC 32.5 11/28/2021   RDW 15.8 (H) 11/28/2021   LYMPHSABS 2.8 07/04/2021   MONOABS 0.9 07/04/2021   EOSABS 0.4 07/04/2021    BMET Lab Results  Component Value Date   NA 139 11/28/2021   K 4.1 11/28/2021   CL 105 11/28/2021   CO2 24 11/28/2021   GLUCOSE 110 (H) 11/28/2021   BUN 14 11/28/2021   CREATININE 1.68 (H) 11/28/2021   CALCIUM 9.3 11/28/2021   GFRNONAA 36 (L) 11/28/2021   GFRAA 48 (L) 02/22/2021      Assessment and Plan  Hiv disease- will check labs and switch off of biktarvy  Due to ckd 3 - will try to switch off of TAF. Will do dovato. No hx of lamivudine exposure (no recent genotype to suggest resistance(  Transaminitis = review of her U/S suggested fatty liver, awaiting to also see what results yield from her recent colonoscopy this past week (if related). Continue to stop meds that may cause transaminitis  Ckd 3= will check cr;otherwise is following up with nephrology. It will be ineteresting to see if any changes while off of TAF

## 2022-02-10 DIAGNOSIS — R933 Abnormal findings on diagnostic imaging of other parts of digestive tract: Secondary | ICD-10-CM | POA: Diagnosis not present

## 2022-02-10 DIAGNOSIS — K760 Fatty (change of) liver, not elsewhere classified: Secondary | ICD-10-CM | POA: Diagnosis not present

## 2022-02-11 LAB — CBC WITH DIFFERENTIAL/PLATELET
Absolute Monocytes: 774 cells/uL (ref 200–950)
Basophils Absolute: 36 cells/uL (ref 0–200)
Basophils Relative: 0.3 %
Eosinophils Absolute: 484 cells/uL (ref 15–500)
Eosinophils Relative: 4 %
HCT: 44.7 % (ref 35.0–45.0)
Hemoglobin: 15.1 g/dL (ref 11.7–15.5)
Lymphs Abs: 3025 cells/uL (ref 850–3900)
MCH: 30.7 pg (ref 27.0–33.0)
MCHC: 33.8 g/dL (ref 32.0–36.0)
MCV: 90.9 fL (ref 80.0–100.0)
MPV: 10.6 fL (ref 7.5–12.5)
Monocytes Relative: 6.4 %
Neutro Abs: 7780 cells/uL (ref 1500–7800)
Neutrophils Relative %: 64.3 %
Platelets: 417 10*3/uL — ABNORMAL HIGH (ref 140–400)
RBC: 4.92 10*6/uL (ref 3.80–5.10)
RDW: 13.7 % (ref 11.0–15.0)
Total Lymphocyte: 25 %
WBC: 12.1 10*3/uL — ABNORMAL HIGH (ref 3.8–10.8)

## 2022-02-11 LAB — COMPLETE METABOLIC PANEL WITH GFR
AG Ratio: 1.1 (calc) (ref 1.0–2.5)
ALT: 25 U/L (ref 6–29)
AST: 18 U/L (ref 10–35)
Albumin: 3.9 g/dL (ref 3.6–5.1)
Alkaline phosphatase (APISO): 76 U/L (ref 37–153)
BUN/Creatinine Ratio: 12 (calc) (ref 6–22)
BUN: 19 mg/dL (ref 7–25)
CO2: 23 mmol/L (ref 20–32)
Calcium: 9.6 mg/dL (ref 8.6–10.4)
Chloride: 104 mmol/L (ref 98–110)
Creat: 1.63 mg/dL — ABNORMAL HIGH (ref 0.50–1.03)
Globulin: 3.7 g/dL (calc) (ref 1.9–3.7)
Glucose, Bld: 133 mg/dL — ABNORMAL HIGH (ref 65–99)
Potassium: 4.1 mmol/L (ref 3.5–5.3)
Sodium: 138 mmol/L (ref 135–146)
Total Bilirubin: 0.5 mg/dL (ref 0.2–1.2)
Total Protein: 7.6 g/dL (ref 6.1–8.1)
eGFR: 37 mL/min/{1.73_m2} — ABNORMAL LOW (ref >=60)

## 2022-02-11 LAB — T-HELPER CELLS (CD4) COUNT (NOT AT ARMC)
Absolute CD4: 943 cells/uL (ref 490–1740)
CD4 T Helper %: 30 % (ref 30–61)
Total lymphocyte count: 3167 cells/uL (ref 850–3900)

## 2022-02-11 LAB — HIV-1 RNA QUANT-NO REFLEX-BLD
HIV 1 RNA Quant: NOT DETECTED Copies/mL
HIV-1 RNA Quant, Log: NOT DETECTED Log cps/mL

## 2022-02-13 DIAGNOSIS — E1165 Type 2 diabetes mellitus with hyperglycemia: Secondary | ICD-10-CM | POA: Diagnosis not present

## 2022-02-13 DIAGNOSIS — I10 Essential (primary) hypertension: Secondary | ICD-10-CM | POA: Diagnosis not present

## 2022-02-13 DIAGNOSIS — E7800 Pure hypercholesterolemia, unspecified: Secondary | ICD-10-CM | POA: Diagnosis not present

## 2022-02-14 ENCOUNTER — Encounter: Payer: Self-pay | Admitting: Orthopedic Surgery

## 2022-02-14 NOTE — Progress Notes (Signed)
Office Visit Note   Patient: Katrina Wolfe           Date of Birth: 01/12/1966           MRN: [id number] Visit Date: 02/07/2022              Requested by: Wenda Low, MD [address] [place],  [place] [address] PCP: Wenda Low, MD  Chief Complaint  Patient presents with   Left Ankle - Routine Post Op    11/30/21 left tib-cal fusion      HPI: Patient is a 56 year old woman who is 2 weeks status post left tibial calcaneal fusion she has been in a fracture boot.  Assessment & Plan: Visit Diagnoses:  1. H/O ankle fusion     Plan: Patient will begin weightbearing as tolerated in her fracture boot repeat three-view radiographs of the left ankle at follow-up.  Follow-Up Instructions: Return in about 4 weeks (around 03/07/2022).   Ortho Exam  Patient is alert, oriented, no adenopathy, well-dressed, normal affect, normal respiratory effort. Examination the ulcer on the lateral aspect of her foot has completely healed her foot is plantigrade there is no redness or cellulitis no drainage patient states she feels good without pain.  Imaging: No results found. No images are attached to the encounter.  Labs: Lab Results  Component Value Date   HGBA1C 8.2 (H) 11/30/2021   HGBA1C 8.8 (H) 06/24/2021   ESRSEDRATE 53 (H) 08/15/2021   ESRSEDRATE 74 (H) 06/30/2021   CRP 18.5 (H) 08/15/2021   CRP 0.9 06/30/2021   REPTSTATUS 07/03/2021 FINAL 06/27/2021   GRAMSTAIN  06/27/2021    RARE WBC PRESENT,BOTH PMN AND MONONUCLEAR NO ORGANISMS SEEN    CULT  06/27/2021    RARE CORYNEBACTERIUM SPECIES Standardized susceptibility testing for this organism is not available. NO ANAEROBES ISOLATED Performed at [facility], [address], [place], [place] [address]    LABORGA PSEUDOMONAS AERUGINOSA 04/04/2019   LABORGA SERRATIA MARCESCENS 04/04/2019   LABORGA STAPHYLOCOCCUS AUREUS 04/04/2019     Lab Results  Component Value Date   ALBUMIN 2.8 (L) 07/02/2021    ALBUMIN 2.8 (L) 07/01/2021   ALBUMIN 2.7 (L) 06/30/2021    Lab Results  Component Value Date   MG 1.6 (L) 07/04/2021   MG 1.5 (L) 07/03/2021   MG 1.6 (L) 07/02/2021   No results found for: VD25OH  No results found for: PREALBUMIN CBC EXTENDED Latest Ref Rng & Units 02/09/2022 11/28/2021 07/04/2021  WBC 3.8 - 10.8 Thousand/uL 12.1(H) 11.9(H) 11.2(H)  RBC 3.80 - 5.10 Million/uL 4.92 5.27(H) 4.08  HGB 11.7 - 15.5 g/dL 15.1 15.8(H) 12.8  HCT 35.0 - 45.0 % 44.7 48.6(H) 38.7  PLT 140 - 400 Thousand/uL 417(H) 422(H) 353  NEUTROABS 1,500 - 7,800 cells/uL 7,780 - 7.0  LYMPHSABS 850 - 3,900 cells/uL 3,025 - 2.8     There is no height or weight on file to calculate BMI.  Orders:  Orders Placed This Encounter  Procedures   XR Ankle Complete Left   No orders of the defined types were placed in this encounter.    Procedures: No procedures performed  Clinical Data: No additional findings.  ROS:  All other systems negative, except as noted in the HPI. Review of Systems  Objective: Vital Signs: There were no vitals taken for this visit.  Specialty Comments:  No specialty comments available.  PMFS History: Patient Active Problem List   Diagnosis Date Noted   S/P ankle fusion 11/30/2021  Charcot's joint, left ankle and foot    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, with necrosis of bone ([place])    Diabetes mellitus type 2, uncontrolled, with complications 59/93/5701   Diabetic nephropathy ([place]) 06/26/2021   CKD (chronic kidney disease), stage III ([place]) 06/26/2021   HTN (hypertension) 06/24/2021   HIV (human immunodeficiency virus infection) ([place]) 06/24/2021   DM2 (diabetes mellitus, type 2) ([place]) 06/24/2021   HLD (hyperlipidemia) 06/24/2021   Osteomyelitis of left foot ([place]) 06/24/2021   Elevated LFTs 06/24/2021   Acute-on-chronic kidney injury ([place]) 06/24/2021   Osteomyelitis of foot ([place]) 04/04/2019   Hardware  complicating wound infection ([place])    Subacute osteomyelitis, left ankle and foot ([place])    Open displaced fracture of fifth metatarsal bone of left foot    Healthcare maintenance 04/03/2019   HIV disease ([place]) 10/27/2015   DM type 2 (diabetes mellitus, type 2) ([place]) 10/27/2015   Hyperlipidemia 10/27/2015   Essential hypertension 10/27/2015   Severe vulvar dysplasia, histologically confirmed 10/15/2015   Past Medical History:  Diagnosis Date   Abnormal uterine bleeding (AUB)    Arthritis    knees, fingers   Asthma    as child, no problems in 20 yrs   Cancer ([place])    CKD (chronic kidney disease), stage II    Congenital cardiomegaly    per pt has always been told since childhood and siblings    Diabetes mellitus without complication ([place])    GERD (gastroesophageal reflux disease)    History of genital warts    HIV (human immunodeficiency virus infection) ([place])    [place]--  MONITORED BY INFECTIOUS DISEASE-- DR Carlyle Basques   HIV (human immunodeficiency virus infection) ([place])    Hypercholesterolemia    Hypertension    Intermittent palpitations    mild-- no meds   Legally blind in left eye, as defined in [place]    trauma as child   Peripheral vascular disease ([place])    Renal disorder    Tuberculosis    ? 2005 or 2007   Type 2 diabetes mellitus ([place])    Type II    Uterine fibroid    VIN III (vulvar intraepithelial neoplasia III)    and Verrucoid lesion on the mons   Wears glasses     Family History  Problem Relation Age of Onset   Hypertension Mother    Diabetes Father    Cancer Brother    Breast cancer Cousin     Past Surgical History:  Procedure Laterality Date   CESAREAN SECTION  2001  &  7793   CO2 LASER APPLICATION N/A 90/30/0923   Procedure: CO2 LASER APPLICATION AND WIDE VOCAL EXCSION OF LESION ON MONS PUBIS;  Surgeon: Marti Sleigh, MD;  Location: [facility];  Service: Gynecology;  Laterality: N/A;   CASE CANCELLED    CO2 LASER  APPLICATION N/A 30/06/6225   Procedure: CO2 LASER OF VULVAR;  Surgeon: Marti Sleigh, MD;  Location[facility];  Service: Gynecology;  Laterality: N/A;   CORNEAL TRANSPLANT Left 12/18/2004   failed   D & C HYSTEROSCOPY /  RESECTION FIBROID  12/18/2002   DILATATION & CURETTAGE/HYSTEROSCOPY WITH MYOSURE N/A 10/01/2019   Procedure: DILATATION & CURETTAGE/HYSTEROSCOPY WITH MYOSURE and HYDROTHERMAL ABLATION;  Surgeon: Thurnell Lose, MD;  Location: [facility];  Service: Gynecology;  Laterality: N/A;  HTA rep will be here. Confirmed on 09/25/19 CS   EYE SURGERY  FOOT ARTHRODESIS Left 11/30/2021   Procedure: LEFT TIBIOCALCANEAL FUSION;  Surgeon: Newt Minion, MD;  Location: [facility];  Service: Orthopedics;  Laterality: Left;   HARDWARE REMOVAL Left 04/04/2019   Procedure: EXCISION BONE AND REMOVE DEEP HARDWARE LEFT 5TH METATARSAL;  Surgeon: Newt Minion, MD;  Location: [facility];  Service: Orthopedics;  Laterality: Left;   I & D EXTREMITY Left 06/27/2021   Procedure: IRRIGATION AND DEBRIDEMENT FOOT ULCER;  Surgeon: Jessy Oto, MD;  Location: [facility];  Service: Orthopedics;  Laterality: Left;   KNEE ARTHROSCOPY W/ ACL RECONSTRUCTION Bilateral 12/18/2006   ORIF TOE FRACTURE Left 08/02/2017   Procedure: OPEN REDUCTION INTERNAL FIXATION (ORIF) FIFTH METATARSAL (TOE) BASE FRACTURE;  Surgeon: Wylene Simmer, MD;  Location: [facility];  Service: Orthopedics;  Laterality: Left;   VULVECTOMY N/A 01/07/2016   Procedure: WIDE LOCAL EXCISION;  Surgeon: Marti Sleigh, MD;  Location: [facility];  Service: Gynecology;  Laterality: N/A;  mons pubis as site   Social History   Occupational History   Not on file  Tobacco Use   Smoking status: Never   Smokeless tobacco: Never  Vaping Use   Vaping Use: Never used  Substance and Sexual Activity   Alcohol use: No    Alcohol/week: 0.0 standard drinks   Drug use: No   Sexual activity:  Yes    Partners: Male    Birth control/protection: None, Condom    Comment: declined condoms 03/07/21

## 2022-02-17 DIAGNOSIS — I10 Essential (primary) hypertension: Secondary | ICD-10-CM | POA: Diagnosis not present

## 2022-02-17 DIAGNOSIS — E1165 Type 2 diabetes mellitus with hyperglycemia: Secondary | ICD-10-CM | POA: Diagnosis not present

## 2022-03-07 ENCOUNTER — Encounter: Payer: Medicare Other | Admitting: Orthopedic Surgery

## 2022-03-09 ENCOUNTER — Encounter: Payer: Medicare Other | Admitting: Orthopedic Surgery

## 2022-03-20 ENCOUNTER — Ambulatory Visit (INDEPENDENT_AMBULATORY_CARE_PROVIDER_SITE_OTHER): Payer: Medicare Other | Admitting: Orthopedic Surgery

## 2022-03-20 ENCOUNTER — Encounter: Payer: Self-pay | Admitting: Orthopedic Surgery

## 2022-03-20 ENCOUNTER — Ambulatory Visit (INDEPENDENT_AMBULATORY_CARE_PROVIDER_SITE_OTHER): Payer: Medicare Other

## 2022-03-20 DIAGNOSIS — Z981 Arthrodesis status: Secondary | ICD-10-CM

## 2022-03-20 NOTE — Progress Notes (Signed)
? ?Office Visit Note ?  ?Patient: Katrina Wolfe           ?Date of Birth: 1966/08/28           ?MRN: [id number] ?Visit Date: 03/20/2022 ?             ?Requested by: Wenda Low, MD ?[address] ?[address] ?[place],  [place] [address] ?PCP: Wenda Low, MD ? ?Chief Complaint  ?Patient presents with  ? Left Ankle - Follow-up  ?  11/30/21 left tib-cal fusion  ? ? ? ? ?HPI: ?Patient is a 56 year old woman who presents 4 months status post left tibial calcaneal fusion.  She had varus collapse of the ankle was ambulating on the lateral side of her foot and had developed an ulcer with osteomyelitis of the base of the fifth metatarsal.  Patient will underwent anterior fusion with resection of the osteomyelitis base of the fifth metatarsal.  Patient states she has no pain with walking in the fracture boot she states she does have some knee pain. ? ?Assessment & Plan: ?Visit Diagnoses:  ?1. H/O ankle fusion   ? ? ?Plan: We will continue with weightbearing as tolerated in the fracture boot.  Plan to follow-up in 4 weeks with repeat radiographs at which time we will schedule to remove the hardware. ? ?Follow-Up Instructions: Return in about 4 weeks (around 04/17/2022).  ? ?Ortho Exam ? ?Patient is alert, oriented, no adenopathy, well-dressed, normal affect, normal respiratory effort. ?Examination patient's foot is plantigrade the lateral ulcer has completely healed there is no redness cellulitis or drainage her foot is plantigrade with weightbearing.  Anteriorly I can palpate the proximal aspect of the tibial plate.  There are no ulcers no redness no cellulitis. ? ?Imaging: ?XR Ankle Complete Left ? ?Result Date: 03/20/2022 ?Three-view radiographs of the left ankle shows stable alignment of the fusion.  There is lucency around the screws in the anterior plate that is pulled off has not changed.  ?No images are attached to the encounter. ? ?Labs: ?Lab Results  ?Component Value Date  ? HGBA1C 8.2 (H) 11/30/2021  ? HGBA1C 8.8  (H) 06/24/2021  ? ESRSEDRATE 53 (H) 08/15/2021  ? ESRSEDRATE 74 (H) 06/30/2021  ? CRP 18.5 (H) 08/15/2021  ? CRP 0.9 06/30/2021  ? REPTSTATUS 07/03/2021 FINAL 06/27/2021  ? GRAMSTAIN  06/27/2021  ?  RARE WBC PRESENT,BOTH PMN AND MONONUCLEAR ?NO ORGANISMS SEEN ?  ? CULT  06/27/2021  ?  RARE CORYNEBACTERIUM SPECIES ?Standardized susceptibility testing for this organism is not available. ?NO ANAEROBES ISOLATED ?Performed at [facility], [place] [address], [place], [place] [address] ?  ? LABORGA PSEUDOMONAS AERUGINOSA 04/04/2019  ? LABORGA SERRATIA MARCESCENS 04/04/2019  ? [place] STAPHYLOCOCCUS AUREUS 04/04/2019  ? ? ? ?Lab Results  ?Component Value Date  ? ALBUMIN 2.8 (L) 07/02/2021  ? ALBUMIN 2.8 (L) 07/01/2021  ? ALBUMIN 2.7 (L) 06/30/2021  ? ? ?Lab Results  ?Component Value Date  ? MG 1.6 (L) 07/04/2021  ? MG 1.5 (L) 07/03/2021  ? MG 1.6 (L) 07/02/2021  ? ?No results found for: VD25OH ? ?No results found for: PREALBUMIN ? ?  Latest Ref Rng & Units 02/09/2022  ?  2:25 PM 11/28/2021  ? 10:54 AM 07/04/2021  ?  3:29 AM  ?CBC EXTENDED  ?WBC 3.8 - 10.8 Thousand/uL 12.1   11.9   11.2    ?RBC 3.80 - 5.10 Million/uL 4.92   5.27   4.08    ?Hemoglobin 11.7 - 15.5  g/dL 15.1   15.8   12.8    ?HCT 35.0 - 45.0 % 44.7   48.6   38.7    ?Platelets 140 - 400 Thousand/uL 417   422   353    ?NEUT# 1,500 - 7,800 cells/uL 7,780    7.0    ?Lymph# 850 - 3,900 cells/uL 3,025    2.8    ? ? ? ?There is no height or weight on file to calculate BMI. ? ?Orders:  ?Orders Placed This Encounter  ?Procedures  ? XR Ankle Complete Left  ? ?No orders of the defined types were placed in this encounter. ? ? ? Procedures: ?No procedures performed ? ?Clinical Data: ?No additional findings. ? ?ROS: ? ?All other systems negative, except as noted in the HPI. ?Review of Systems ? ?Objective: ?Vital Signs: There were no vitals taken for this visit. ? ?Specialty Comments:  ?No specialty comments available. ? ?PMFS History: ?Patient Active Problem List  ?  Diagnosis Date Noted  ? S/P ankle fusion 11/30/2021  ? Charcot's joint, left ankle and foot   ? 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, with necrosis of bone ([place])   ? Diabetes mellitus type 2, uncontrolled, with complications 75/17/0017  ? Diabetic nephropathy ([place]) 06/26/2021  ? CKD (chronic kidney disease), stage III ([place]) 06/26/2021  ? HTN (hypertension) 06/24/2021  ? HIV (human immunodeficiency virus infection) ([place]) 06/24/2021  ? DM2 (diabetes mellitus, type 2) ([place]) 06/24/2021  ? HLD (hyperlipidemia) 06/24/2021  ? Osteomyelitis of left foot ([place]) 06/24/2021  ? Elevated LFTs 06/24/2021  ? Acute-on-chronic kidney injury ([place]) 06/24/2021  ? Osteomyelitis of foot ([place]) 04/04/2019  ? Hardware complicating wound infection ([place])   ? Subacute osteomyelitis, left ankle and foot ([place])   ? Open displaced fracture of fifth metatarsal bone of left foot   ? Healthcare maintenance 04/03/2019  ? HIV disease ([place]) 10/27/2015  ? DM type 2 (diabetes mellitus, type 2) ([place]) 10/27/2015  ? Hyperlipidemia 10/27/2015  ? Essential hypertension 10/27/2015  ? Severe vulvar dysplasia, histologically confirmed 10/15/2015  ? ?Past Medical History:  ?Diagnosis Date  ? Abnormal uterine bleeding (AUB)   ? Arthritis   ? knees, fingers  ? Asthma   ? as child, no problems in 20 yrs  ? Cancer [facility])   ? CKD (chronic kidney disease), stage II   ? Congenital cardiomegaly   ? per pt has always been told since childhood and siblings   ? Diabetes mellitus without complication ([place])   ? GERD (gastroesophageal reflux disease)   ? History of genital warts   ? HIV (human immunodeficiency virus infection) ([place])   ? DX 1998--  MONITORED BY INFECTIOUS DISEASE-- DR Carlyle Basques  ? HIV (human immunodeficiency virus infection) ([place])   ? Hypercholesterolemia   ? Hypertension   ? Intermittent palpitations   ? mild-- no meds  ? Legally blind in left eye, as defined in [place]   ? trauma as child  ? Peripheral vascular  disease ([place])   ? Renal disorder   ? Tuberculosis   ? ? 2005 or 2007  ? Type 2 diabetes mellitus ([place])   ? Type II   ? Uterine fibroid   ? VIN III (vulvar intraepithelial neoplasia III)   ? and Verrucoid lesion on the mons  ? Wears glasses   ?  ?Family History  ?Problem Relation Age of Onset  ? Hypertension Mother   ? Diabetes Father   ? Cancer Brother   ?  Breast cancer Cousin   ?  ?Past Surgical History:  ?Procedure Laterality Date  ? CESAREAN SECTION  2001  &  1984  ? CO2 LASER APPLICATION N/A 96/75/9163  ? Procedure: CO2 LASER APPLICATION AND WIDE VOCAL EXCSION OF LESION ON MONS PUBIS;  Surgeon: Marti Sleigh, MD;  Location: [place];  Service: Gynecology;  Laterality: N/A;   CASE CANCELLED   ? CO2 LASER APPLICATION N/A 84/66/5993  ? Procedure: CO2 LASER OF VULVAR;  Surgeon: Marti Sleigh, MD;  Location: [facility];  Service: Gynecology;  Laterality: N/A;  ? CORNEAL TRANSPLANT Left 12/18/2004  ? failed  ? D & C HYSTEROSCOPY /  RESECTION FIBROID  12/18/2002  ? DILATATION & CURETTAGE/HYSTEROSCOPY WITH MYOSURE N/A 10/01/2019  ? Procedure: DILATATION & CURETTAGE/HYSTEROSCOPY WITH MYOSURE and HYDROTHERMAL ABLATION;  Surgeon: Thurnell Lose, MD;  Location: [place];  Service: Gynecology;  Laterality: N/A;  HTA rep will be here. Confirmed on 09/25/19 CS  ? EYE SURGERY    ? FOOT ARTHRODESIS Left 11/30/2021  ? Procedure: LEFT TIBIOCALCANEAL FUSION;  Surgeon: Newt Minion, MD;  Location: [place];  Service: Orthopedics;  Laterality: Left;  ? HARDWARE REMOVAL Left 04/04/2019  ? Procedure: EXCISION BONE AND REMOVE DEEP HARDWARE LEFT 5TH METATARSAL;  Surgeon: Newt Minion, MD;  Location: [place];  Service: Orthopedics;  Laterality: Left;  ? I & D EXTREMITY Left 06/27/2021  ? Procedure: IRRIGATION AND DEBRIDEMENT FOOT ULCER;  Surgeon: Jessy Oto, MD;  Location: [facility];  Service: Orthopedics;  Laterality: Left;  ? KNEE ARTHROSCOPY W/ ACL RECONSTRUCTION  Bilateral 12/18/2006  ? ORIF TOE FRACTURE Left 08/02/2017  ? Procedure: OPEN REDUCTION INTERNAL FIXATION (ORIF) FIFTH METATARSAL (TOE) BASE FRACTURE;  Surgeon: Wylene Simmer, MD;  Location: [place]

## 2022-03-21 DIAGNOSIS — I10 Essential (primary) hypertension: Secondary | ICD-10-CM | POA: Diagnosis not present

## 2022-03-21 DIAGNOSIS — E7800 Pure hypercholesterolemia, unspecified: Secondary | ICD-10-CM | POA: Diagnosis not present

## 2022-03-21 DIAGNOSIS — E1165 Type 2 diabetes mellitus with hyperglycemia: Secondary | ICD-10-CM | POA: Diagnosis not present

## 2022-03-28 ENCOUNTER — Ambulatory Visit
Admission: RE | Admit: 2022-03-28 | Discharge: 2022-03-28 | Disposition: A | Discharge status: Home / Self Care | Payer: Medicare Other | Source: Ambulatory Visit | Attending: Internal Medicine | Admitting: Internal Medicine

## 2022-03-28 DIAGNOSIS — Z1231 Encounter for screening mammogram for malignant neoplasm of breast: Secondary | ICD-10-CM | POA: Diagnosis not present

## 2022-03-28 DIAGNOSIS — Z0000 Encounter for general adult medical examination without abnormal findings: Secondary | ICD-10-CM

## 2022-03-28 IMAGING — MG MM DIGITAL SCREENING BILAT W/ TOMO AND CAD
8 of 14 series · 8 of 40 positions shown · non-contrast
Comparison: Previous exam(s).

CLINICAL DATA: Screening.

EXAM:
DIGITAL SCREENING BILATERAL MAMMOGRAM WITH TOMOSYNTHESIS AND CAD
TECHNIQUE: Bilateral screening digital craniocaudal and mediolateral oblique
mammograms were obtained. Bilateral screening digital breast
tomosynthesis was performed. The images were evaluated with
computer-aided detection.

[L MLO synth-2D (1 of 2)]
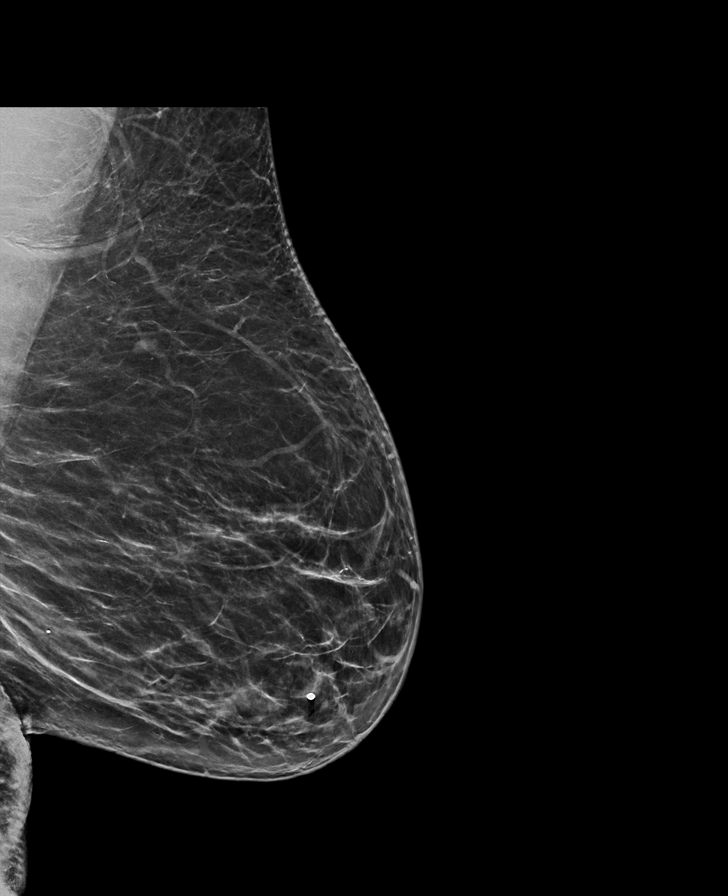

[L CC synth-2D (1 of 2)]
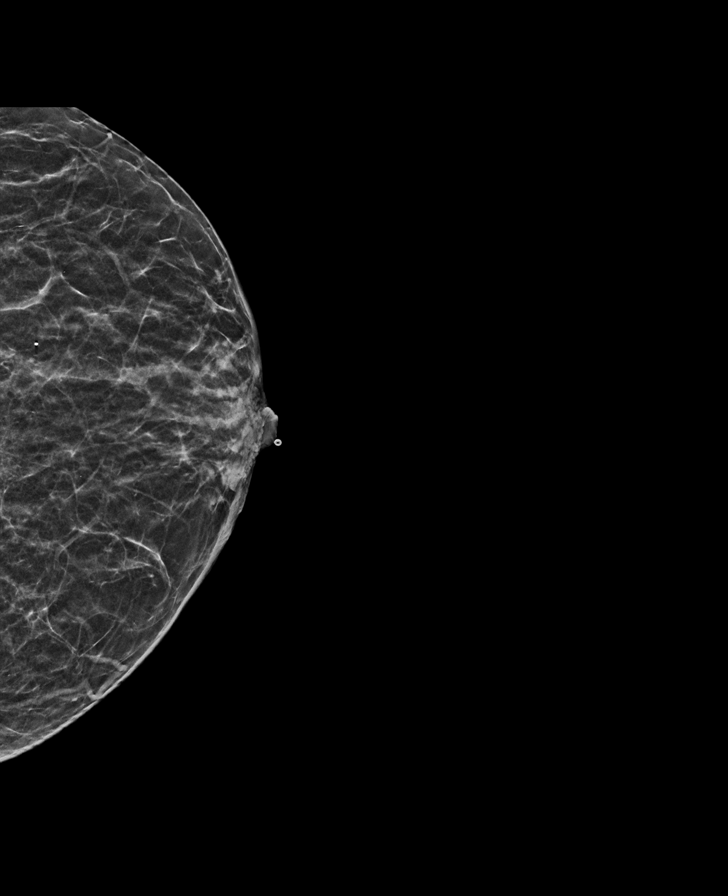

[R CC synth-2D (1 of 2)]
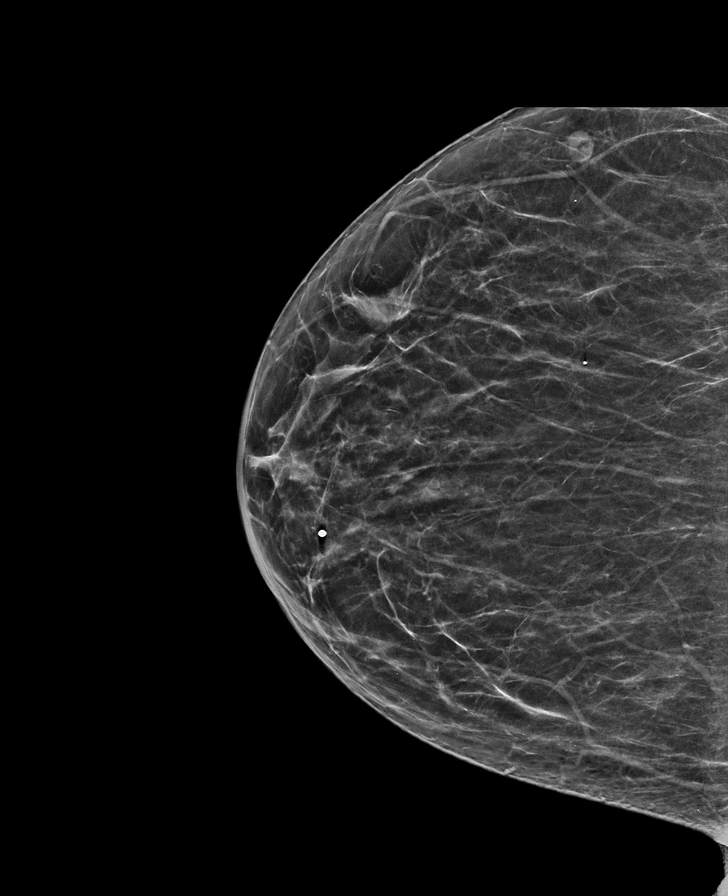

[L MLO synth-2D (2 of 2)]
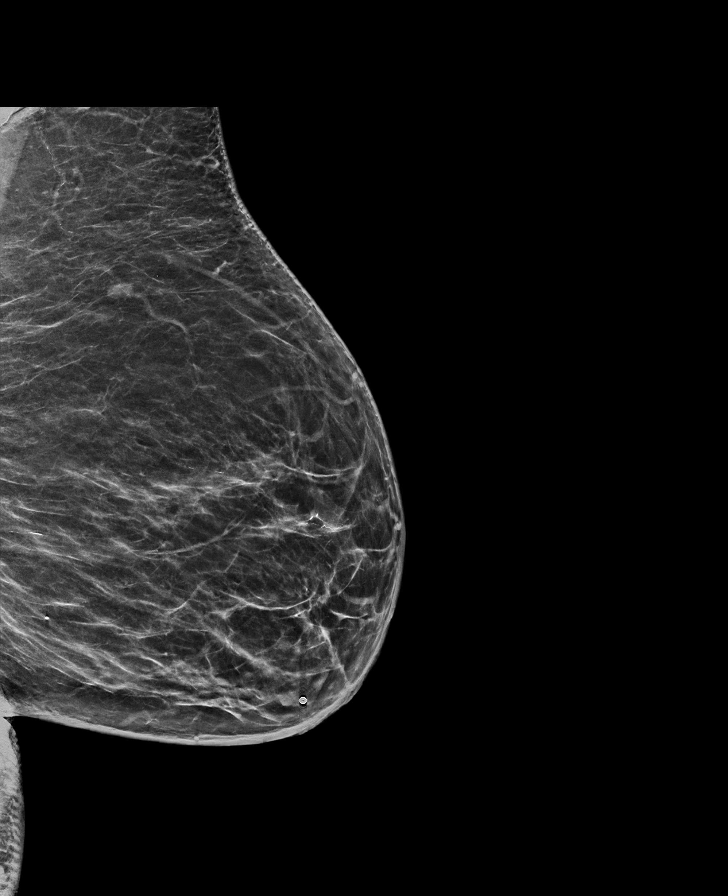

[R MLO synth-2D]
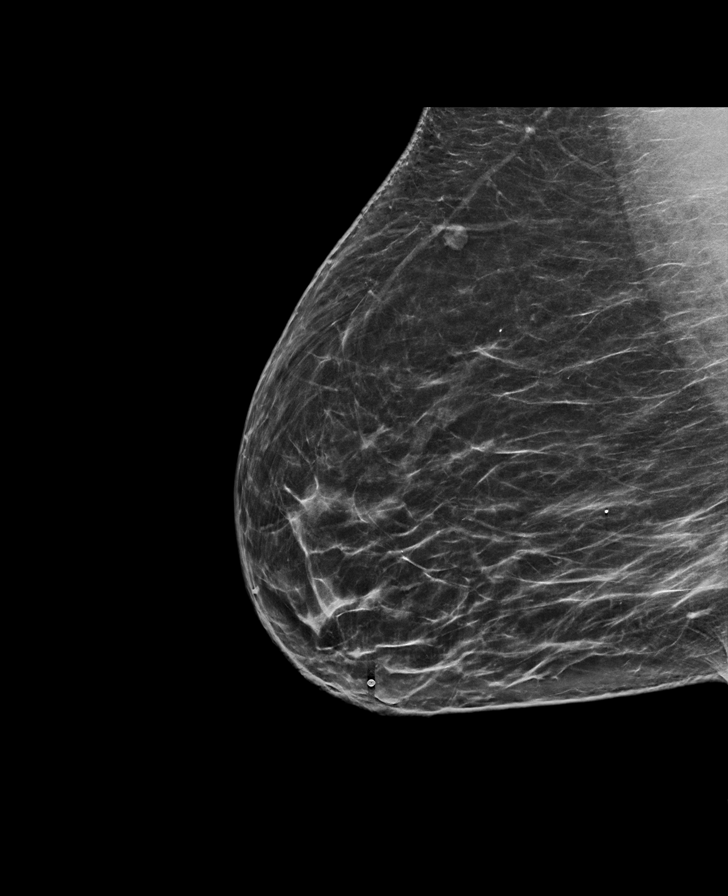

[R CC synth-2D (2 of 2)]
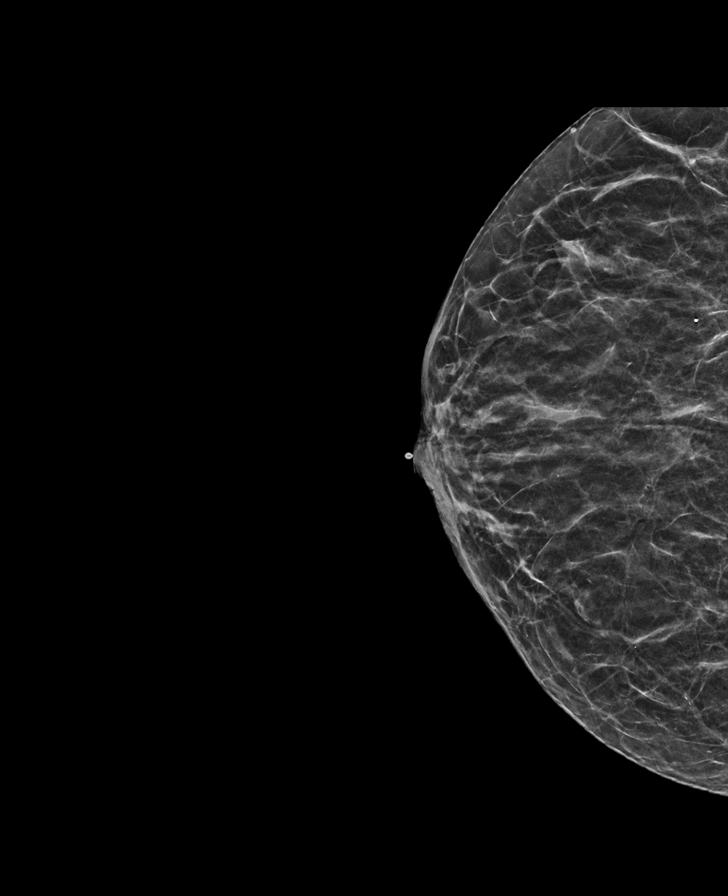

[L CC synth-2D (2 of 2)]
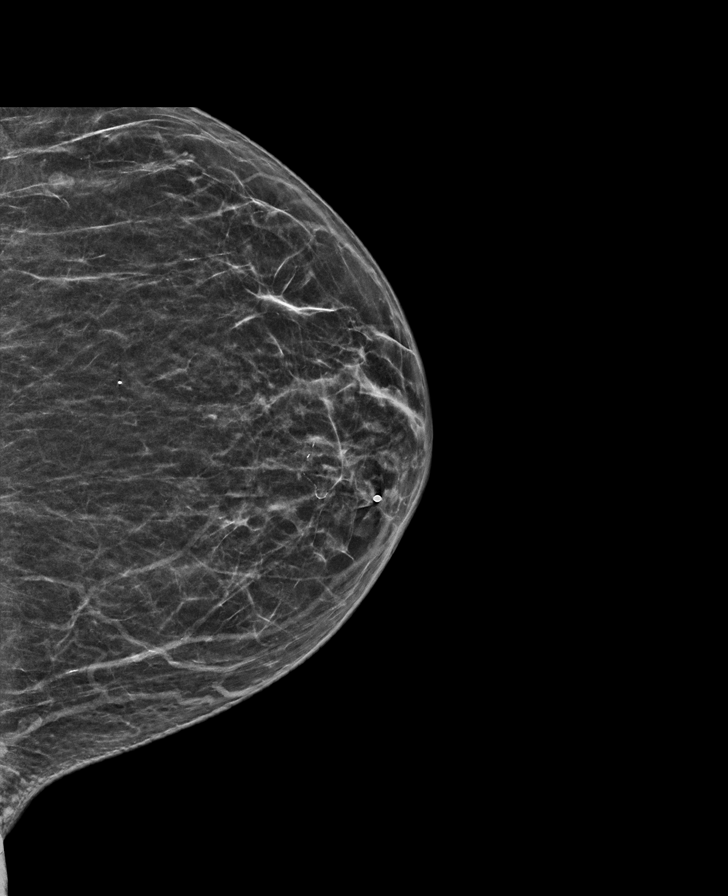

[L CC tomo · tomo slice 27/53.0]
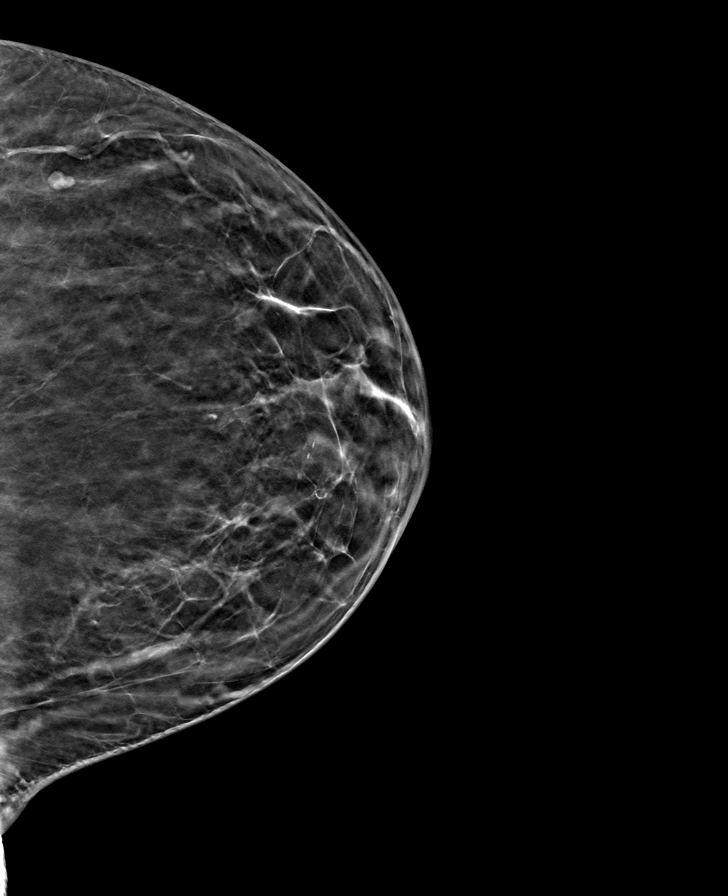

[8 of 40 positions shown; findings below may reference images not displayed]

ACR Breast Density Category b: There are scattered areas of
fibroglandular density.
FINDINGS: There are no findings suspicious for malignancy.
IMPRESSION: No mammographic evidence of malignancy. A result letter of this
screening mammogram will be mailed directly to the patient.

RECOMMENDATION:
Screening mammogram in one year. (Code:[BY])

BI-RADS CATEGORY  1: Negative.

## 2022-04-17 ENCOUNTER — Ambulatory Visit (INDEPENDENT_AMBULATORY_CARE_PROVIDER_SITE_OTHER): Payer: Medicare Other | Admitting: Orthopedic Surgery

## 2022-04-17 ENCOUNTER — Ambulatory Visit (INDEPENDENT_AMBULATORY_CARE_PROVIDER_SITE_OTHER): Payer: Medicare Other

## 2022-04-17 DIAGNOSIS — T85848D Pain due to other internal prosthetic devices, implants and grafts, subsequent encounter: Secondary | ICD-10-CM | POA: Diagnosis not present

## 2022-04-17 DIAGNOSIS — Z981 Arthrodesis status: Secondary | ICD-10-CM

## 2022-04-25 DIAGNOSIS — N1831 Chronic kidney disease, stage 3a: Secondary | ICD-10-CM | POA: Diagnosis not present

## 2022-04-25 DIAGNOSIS — E1142 Type 2 diabetes mellitus with diabetic polyneuropathy: Secondary | ICD-10-CM | POA: Diagnosis not present

## 2022-04-25 DIAGNOSIS — E7800 Pure hypercholesterolemia, unspecified: Secondary | ICD-10-CM | POA: Diagnosis not present

## 2022-04-25 DIAGNOSIS — I10 Essential (primary) hypertension: Secondary | ICD-10-CM | POA: Diagnosis not present

## 2022-05-01 ENCOUNTER — Encounter: Payer: Self-pay | Admitting: Orthopedic Surgery

## 2022-05-01 NOTE — Progress Notes (Signed)
? ?Office Visit Note ?  ?Patient: Katrina Wolfe           ?Date of Birth: 04/21/1966           ?MRN: [id number] ?Visit Date: 04/17/2022 ?             ?Requested by: Wenda Low, MD ?[address] ?[address] ?[place],  [place] [address] ?PCP: Wenda Low, MD ? ?Chief Complaint  ?Patient presents with  ? Left Ankle - Follow-up  ?  11/30/21 left tib-cal fusion  ? ? ? ? ?HPI: ?Patient is a 56 year old woman who is 5 months status post left tibial calcaneal fusion.  Patient still has prominent hardware anteriorly from hardware failure. ? ?Assessment & Plan: ?Visit Diagnoses:  ?1. H/O ankle fusion   ?2. Pain from implanted hardware, subsequent encounter   ? ? ?Plan: We will plan for hardware removal.  Patient wants to proceed in the beginning of June.  We will set this up at her convenience outpatient surgery at [facility]. ? ?Follow-Up Instructions: Return in about 2 weeks (around 05/01/2022).  ? ?Ortho Exam ? ?Patient is alert, oriented, no adenopathy, well-dressed, normal affect, normal respiratory effort. ?Examination the skin is intact there is no ischemic changes she has a good dorsalis pedis pulse the failed hardware is prominent anteriorly. ? ?Imaging: ?No results found. ?No images are attached to the encounter. ? ?Labs: ?Lab Results  ?Component Value Date  ? HGBA1C 8.2 (H) 11/30/2021  ? HGBA1C 8.8 (H) 06/24/2021  ? ESRSEDRATE 53 (H) 08/15/2021  ? ESRSEDRATE 74 (H) 06/30/2021  ? CRP 18.5 (H) 08/15/2021  ? CRP 0.9 06/30/2021  ? REPTSTATUS 07/03/2021 FINAL 06/27/2021  ? GRAMSTAIN  06/27/2021  ?  RARE WBC PRESENT,BOTH PMN AND MONONUCLEAR ?NO ORGANISMS SEEN ?  ? CULT  06/27/2021  ?  RARE CORYNEBACTERIUM SPECIES ?Standardized susceptibility testing for this organism is not available. ?NO ANAEROBES ISOLATED ?Performed at [facility], [place] [address] [place], [place] [address] ?  ? LABORGA PSEUDOMONAS AERUGINOSA 04/04/2019  ? LABORGA SERRATIA MARCESCENS 04/04/2019  ? [place] STAPHYLOCOCCUS AUREUS 04/04/2019   ? ? ? ?Lab Results  ?Component Value Date  ? ALBUMIN 2.8 (L) 07/02/2021  ? ALBUMIN 2.8 (L) 07/01/2021  ? ALBUMIN 2.7 (L) 06/30/2021  ? ? ?Lab Results  ?Component Value Date  ? MG 1.6 (L) 07/04/2021  ? MG 1.5 (L) 07/03/2021  ? MG 1.6 (L) 07/02/2021  ? ?No results found for: VD25OH ? ?No results found for: PREALBUMIN ? ?  Latest Ref Rng & Units 02/09/2022  ?  2:25 PM 11/28/2021  ? 10:54 AM 07/04/2021  ?  3:29 AM  ?CBC EXTENDED  ?WBC 3.8 - 10.8 Thousand/uL 12.1   11.9   11.2    ?RBC 3.80 - 5.10 Million/uL 4.92   5.27   4.08    ?Hemoglobin 11.7 - 15.5 g/dL 15.1   15.8   12.8    ?HCT 35.0 - 45.0 % 44.7   48.6   38.7    ?Platelets 140 - 400 Thousand/uL 417   422   353    ?NEUT# 1,500 - 7,800 cells/uL 7,780    7.0    ?Lymph# 850 - 3,900 cells/uL 3,025    2.8    ? ? ? ?There is no height or weight on file to calculate BMI. ? ?Orders:  ?Orders Placed This Encounter  ?Procedures  ? XR Foot Complete Left  ? ?No orders of the defined types were placed in this encounter. ? ? ?  Procedures: ?No procedures performed ? ?Clinical Data: ?No additional findings. ? ?ROS: ? ?All other systems negative, except as noted in the HPI. ?Review of Systems ? ?Objective: ?Vital Signs: There were no vitals taken for this visit. ? ?Specialty Comments:  ?No specialty comments available. ? ?PMFS History: ?Patient Active Problem List  ? Diagnosis Date Noted  ? S/P ankle fusion 11/30/2021  ? Charcot's joint, left ankle and foot   ? 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, with necrosis of bone ([place])   ? Diabetes mellitus type 2, uncontrolled, with complications 12/45/8099  ? Diabetic nephropathy ([place]) 06/26/2021  ? CKD (chronic kidney disease), stage III ([place]) 06/26/2021  ? HTN (hypertension) 06/24/2021  ? HIV (human immunodeficiency virus infection) ([place]) 06/24/2021  ? DM2 (diabetes mellitus, type 2) ([place]) 06/24/2021  ? HLD (hyperlipidemia) 06/24/2021  ? Osteomyelitis of left foot ([place]) 06/24/2021  ?  Elevated LFTs 06/24/2021  ? Acute-on-chronic kidney injury ([place]) 06/24/2021  ? Osteomyelitis of foot ([place]) 04/04/2019  ? Hardware complicating wound infection ([place])   ? Subacute osteomyelitis, left ankle and foot ([place])   ? Open displaced fracture of fifth metatarsal bone of left foot   ? Healthcare maintenance 04/03/2019  ? HIV disease ([place]) 10/27/2015  ? DM type 2 (diabetes mellitus, type 2) ([place]) 10/27/2015  ? Hyperlipidemia 10/27/2015  ? Essential hypertension 10/27/2015  ? Severe vulvar dysplasia, histologically confirmed 10/15/2015  ? ?Past Medical History:  ?Diagnosis Date  ? Abnormal uterine bleeding (AUB)   ? Arthritis   ? knees, fingers  ? Asthma   ? as child, no problems in 20 yrs  ? Cancer [facility])   ? CKD (chronic kidney disease), stage II   ? Congenital cardiomegaly   ? per pt has always been told since childhood and siblings   ? Diabetes mellitus without complication ([place])   ? GERD (gastroesophageal reflux disease)   ? History of genital warts   ? HIV (human immunodeficiency virus infection) ([place])   ? DX 1998--  MONITORED BY INFECTIOUS DISEASE-- DR Carlyle Basques  ? HIV (human immunodeficiency virus infection) ([place])   ? Hypercholesterolemia   ? Hypertension   ? Intermittent palpitations   ? mild-- no meds  ? Legally blind in left eye, as defined in [place]   ? trauma as child  ? Peripheral vascular disease ([place])   ? Renal disorder   ? Tuberculosis   ? ? 2005 or 2007  ? Type 2 diabetes mellitus ([place])   ? Type II   ? Uterine fibroid   ? VIN III (vulvar intraepithelial neoplasia III)   ? and Verrucoid lesion on the mons  ? Wears glasses   ?  ?Family History  ?Problem Relation Age of Onset  ? Hypertension Mother   ? Diabetes Father   ? Cancer Brother   ? Breast cancer Cousin   ?  ?Past Surgical History:  ?Procedure Laterality Date  ? CESAREAN SECTION  2001  &  1984  ? CO2 LASER APPLICATION N/A 83/38/2505  ? Procedure: CO2 LASER APPLICATION AND WIDE VOCAL EXCSION OF LESION ON MONS PUBIS;  Surgeon: Marti Sleigh, MD;  Location: [facility];  Service: Gynecology;  Laterality: N/A;   CASE CANCELLED   ? CO2 LASER APPLICATION N/A 39/76/7341  ? Procedure: CO2 LASER OF VULVAR;  Surgeon: Marti Sleigh, MD;  Location[facility];  Service: Gynecology;  Laterality: N/A;  ? CORNEAL TRANSPLANT Left 12/18/2004  ? failed  ?  D & C HYSTEROSCOPY /  RESECTION FIBROID  12/18/2002  ? DILATATION & CURETTAGE/HYSTEROSCOPY WITH MYOSURE N/A 10/01/2019  ? Procedure: DILATATION & CURETTAGE/HYSTEROSCOPY WITH MYOSURE and HYDROTHERMAL ABLATION;  Surgeon: Thurnell Lose, MD;  Location: [place];  Service: Gynecology;  Laterality: N/A;  HTA rep will be here. Confirmed on 09/25/19 CS  ? EYE SURGERY    ? FOOT ARTHRODESIS Left 11/30/2021  ? Procedure: LEFT TIBIOCALCANEAL FUSION;  Surgeon: Newt Minion, MD;  Location: [facility];  Service: Orthopedics;  Laterality: Left;  ? HARDWARE REMOVAL Left 04/04/2019  ? Procedure: EXCISION BONE AND REMOVE DEEP HARDWARE LEFT 5TH METATARSAL;  Surgeon: Newt Minion, MD;  Location: [facility];  Service: Orthopedics;  Laterality: Left;  ? I & D EXTREMITY Left 06/27/2021  ? Procedure: IRRIGATION AND DEBRIDEMENT FOOT ULCER;  Surgeon: Jessy Oto, MD;  Location: [place];  Service: Orthopedics;  Laterality: Left;  ? KNEE ARTHROSCOPY W/ ACL RECONSTRUCTION Bilateral 12/18/2006  ? ORIF TOE FRACTURE Left 08/02/2017  ? Procedure: OPEN REDUCTION INTERNAL FIXATION (ORIF) FIFTH METATARSAL (TOE) BASE FRACTURE;  Surgeon: Wylene Simmer, MD;  Location: [place];  Service: Orthopedics;  Laterality: Left;  ? VULVECTOMY N/A 01/07/2016  ? Procedure: WIDE LOCAL EXCISION;  Surgeon: Marti Sleigh, MD;  Location: [facility];  Service: Gynecology;  Laterality: N/A;  mons pubis as site  ? ?Social History  ? ?Occupational History  ? Not on file  ?Tobacco Use  ? Smoking status: Never  ? Smokeless tobacco: Never  ?Vaping Use  ? Vaping Use: Never  used  ?Substance and Sexual Activity  ? Alcohol use: No  ?  Alcohol/week: 0.0 standard drinks  ? Drug use: No  ? Sexual activity: Yes  ?  Partners: Male  ?  Birth control/protection: None, Condom  ?  Comment: declined con

## 2022-05-10 DIAGNOSIS — K620 Anal polyp: Secondary | ICD-10-CM | POA: Diagnosis not present

## 2022-05-10 DIAGNOSIS — D120 Benign neoplasm of cecum: Secondary | ICD-10-CM | POA: Diagnosis not present

## 2022-05-10 DIAGNOSIS — Z1211 Encounter for screening for malignant neoplasm of colon: Secondary | ICD-10-CM | POA: Diagnosis not present

## 2022-05-10 DIAGNOSIS — D123 Benign neoplasm of transverse colon: Secondary | ICD-10-CM | POA: Diagnosis not present

## 2022-05-11 ENCOUNTER — Ambulatory Visit: Payer: Medicare Other | Admitting: Internal Medicine

## 2022-05-12 DIAGNOSIS — K620 Anal polyp: Secondary | ICD-10-CM | POA: Diagnosis not present

## 2022-05-12 DIAGNOSIS — D123 Benign neoplasm of transverse colon: Secondary | ICD-10-CM | POA: Diagnosis not present

## 2022-05-16 ENCOUNTER — Ambulatory Visit (INDEPENDENT_AMBULATORY_CARE_PROVIDER_SITE_OTHER): Payer: Medicare Other | Admitting: Internal Medicine

## 2022-05-16 ENCOUNTER — Encounter: Payer: Self-pay | Admitting: Internal Medicine

## 2022-05-16 ENCOUNTER — Other Ambulatory Visit: Payer: Self-pay

## 2022-05-16 VITALS — BP 128/81 | HR 72 | Temp 98.3°F

## 2022-05-16 DIAGNOSIS — N184 Chronic kidney disease, stage 4 (severe): Secondary | ICD-10-CM

## 2022-05-16 DIAGNOSIS — E1100 Type 2 diabetes mellitus with hyperosmolarity without nonketotic hyperglycemic-hyperosmolar coma (NKHHC): Secondary | ICD-10-CM | POA: Diagnosis not present

## 2022-05-16 DIAGNOSIS — Z794 Long term (current) use of insulin: Secondary | ICD-10-CM

## 2022-05-16 DIAGNOSIS — R002 Palpitations: Secondary | ICD-10-CM

## 2022-05-16 DIAGNOSIS — B20 Human immunodeficiency virus [HIV] disease: Secondary | ICD-10-CM | POA: Diagnosis not present

## 2022-05-16 NOTE — Progress Notes (Signed)
RFV: follow up for hiv disease  Patient ID: Katrina Wolfe, female   DOB: [birth year], 56 y.o.   MRN: [id number]  HPI 56yo F HIV disease, CD 4 count of 934/VL<20 (feb 2023) on dovato. Takes daily without difficulty. Still has symptomatic irregular heartbeat. + stress exacerbation. Happens 2-3 x per day / roughly 3 x per week. Hasn't heard from cardiology.  Colonoscopy did on on 23rd - did find polyp - path is pending  Probably going to have foot surgery to remove plate from left. (Dr duda)   Outpatient Encounter Medications as of 05/16/2022  Medication Sig   acetaminophen (TYLENOL) 500 MG tablet Take 1,000 mg by mouth every 6 (six) hours as needed for moderate pain.   amLODipine (NORVASC) 5 MG tablet Take 5 mg by mouth in the morning.   aspirin EC 81 MG tablet Take 1 tablet (81 mg total) by mouth 2 (two) times daily. (Patient taking differently: Take 81 mg by mouth daily.)   atorvastatin (LIPITOR) 10 MG tablet Take 10 mg by mouth daily.   calcium carbonate (TUMS - DOSED IN MG ELEMENTAL CALCIUM) 500 MG chewable tablet Chew 1 tablet by mouth as needed for indigestion or heartburn.   Calcium Carbonate-Vitamin D 600-200 MG-UNIT TABS Take 1 tablet by mouth daily.   COMFORT EZ PEN NEEDLES 31G X 8 MM MISC USE WITH insulin THREE TIMES DAILY AS DIRECTED   diphenhydrAMINE (BENADRYL) 25 MG tablet Take 25 mg by mouth every 6 (six) hours as needed for allergies (or allergic reactions).   dolutegravir-lamiVUDine (DOVATO) 50-300 MG tablet Take 1 tablet by mouth daily.   FARXIGA 10 MG TABS tablet Take 10 mg by mouth in the morning.   ferrous sulfate 325 (65 FE) MG EC tablet Take 325 mg by mouth daily with breakfast.   gabapentin (NEURONTIN) 100 MG capsule Take 100 mg by mouth 4 (four) times daily.   glimepiride (AMARYL) 2 MG tablet Take 2 mg by mouth See admin instructions. Take 4 mg by mouth in the morning and 2 mg in the evening   HUMALOG KWIKPEN 100 UNIT/ML KwikPen Inject 10-15 Units into the skin  daily with lunch.   HUMALOG MIX 75/25 KWIKPEN (75-25) 100 UNIT/ML Kwikpen Inject 60 Units into the skin 2 (two) times daily.   HYDROcodone-acetaminophen (NORCO/VICODIN) 5-325 MG tablet Take 1 tablet by mouth every 4 (four) hours as needed.   Insulin Syringe-Needle U-100 (INSULIN SYRINGE 1CC/31GX5/16") 31G X 5/16" 1 ML MISC 1 Units by Does not apply route 2 (two) times daily.   Lancets (ONETOUCH ULTRASOFT) lancets    lisinopril-hydrochlorothiazide (ZESTORETIC) 20-25 MG tablet Take 1 tablet by mouth daily.   norethindrone (AYGESTIN) 5 MG tablet Take 5 mg by mouth in the morning.   ONE TOUCH ULTRA TEST test strip    traMADol (ULTRAM) 50 MG tablet Take 50 mg by mouth every 4 (four) hours as needed for moderate pain.   TRULICITY 3 EY/8.1KG SOPN SMARTSIG:1 Pre-Filled Pen Syringe SUB-Q Once a Week   methocarbamol (ROBAXIN) 500 MG tablet Take 1 tablet (500 mg total) by mouth every 8 (eight) hours as needed for muscle spasms. (Patient not taking: Reported on 05/16/2022)   No facility-administered encounter medications on file as of 05/16/2022.     Patient Active Problem List   Diagnosis Date Noted   S/P ankle fusion 11/30/2021   Charcot's joint, left ankle and foot    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, with necrosis of bone (  [place])    Diabetes mellitus type 2, uncontrolled, with complications 16/09/9603   Diabetic nephropathy ([place]) 06/26/2021   CKD (chronic kidney disease), stage III ([place]) 06/26/2021   HTN (hypertension) 06/24/2021   HIV (human immunodeficiency virus infection) ([place]) 06/24/2021   DM2 (diabetes mellitus, type 2) ([place]) 06/24/2021   HLD (hyperlipidemia) 06/24/2021   Osteomyelitis of left foot ([place]) 06/24/2021   Elevated LFTs 06/24/2021   Acute-on-chronic kidney injury ([place]) 06/24/2021   Osteomyelitis of foot ([place]) 54/08/8118   Hardware complicating wound infection ([place])    Subacute osteomyelitis, left ankle and foot ([place])    Open  displaced fracture of fifth metatarsal bone of left foot    Healthcare maintenance 04/03/2019   HIV disease ([place]) 10/27/2015   DM type 2 (diabetes mellitus, type 2) ([place]) 10/27/2015   Hyperlipidemia 10/27/2015   Essential hypertension 10/27/2015   Severe vulvar dysplasia, histologically confirmed 10/15/2015     Health Maintenance Due  Topic Date Due   COVID-19 Vaccine (1) Never done   FOOT EXAM  Never done   OPHTHALMOLOGY EXAM  Never done   Zoster Vaccines- Shingrix (1 of 2) Never done   COLONOSCOPY (Pts 45-68yr Insurance coverage will need to be confirmed)  Never done   PAP SMEAR-Modifier  01/07/2022     Review of Systems 12 point ros is negative except for what is mentioned in hpi Physical Exam   BP 128/81   Pulse 72   Temp 98.3 F (36.8 C) (Oral)   Physical Exam  Constitutional:  oriented to person, place, and time. appears well-developed and well-nourished. No distress.  HENT: [place]/AT, PERRLA, no scleral icterus Mouth/Throat: Oropharynx is clear and moist. No oropharyngeal exudate.  Cardiovascular: Normal rate, regular rhythm and normal heart sounds. Exam reveals no gallop and no friction rub.  No murmur heard.  Pulmonary/Chest: Effort normal and breath sounds normal. No respiratory distress.  has no wheezes.  Neck = supple, no nuchal rigidity  Skin: Skin is warm and dry. No rash noted. No erythema.  Psychiatric: a normal mood and affect.  behavior is normal.   Lab Results  Component Value Date   CD4TCELL 30 02/09/2022   Lab Results  Component Value Date   CD4TABS 638 05/19/2021   CD4TABS 654 02/22/2021   CD4TABS 660 07/06/2020   Lab Results  Component Value Date   HIV1RNAQUANT Not Detected 02/09/2022   Lab Results  Component Value Date   HEPBSAB NEG 10/13/2015   Lab Results  Component Value Date   LABRPR NON-REACTIVE 02/22/2021    CBC Lab Results  Component Value Date   WBC 12.1 (H) 02/09/2022   RBC 4.92 02/09/2022   HGB 15.1 02/09/2022   HCT  44.7 02/09/2022   PLT 417 (H) 02/09/2022   MCV 90.9 02/09/2022   MCH 30.7 02/09/2022   MCHC 33.8 02/09/2022   RDW 13.7 02/09/2022   LYMPHSABS 3,025 02/09/2022   MONOABS 0.9 07/04/2021   EOSABS 484 02/09/2022    BMET Lab Results  Component Value Date   NA 138 02/09/2022   K 4.1 02/09/2022   CL 104 02/09/2022   CO2 23 02/09/2022   GLUCOSE 133 (H) 02/09/2022   BUN 19 02/09/2022   CREATININE 1.63 (H) 02/09/2022   CALCIUM 9.6 02/09/2022   GFRNONAA 36 (L) 11/28/2021   GFRAA 48 (L) 02/22/2021      Assessment and Plan  Ckd 3 = will check urine protein level  Symptomatic palpitations = will likely need monitoring ( zio patch)  to see  origin of symptoms and dx to how to manage. Will refer to cardiology  Hiv disease=well controlled, continue on dovato  Iddm, well controlled = appears to be on proper regimen. Am 167. Post lunch 206.   Having night time muscles spasm = bilateral. Happens nightly.

## 2022-05-17 LAB — MICROALBUMIN / CREATININE URINE RATIO
Creatinine, Urine: 134 mg/dL (ref 20–275)
Microalb Creat Ratio: 3 mcg/mg creat (ref <30)
Microalb, Ur: 0.4 mg/dL

## 2022-05-26 DIAGNOSIS — I10 Essential (primary) hypertension: Secondary | ICD-10-CM | POA: Diagnosis not present

## 2022-05-26 DIAGNOSIS — N1831 Chronic kidney disease, stage 3a: Secondary | ICD-10-CM | POA: Diagnosis not present

## 2022-05-26 DIAGNOSIS — E7800 Pure hypercholesterolemia, unspecified: Secondary | ICD-10-CM | POA: Diagnosis not present

## 2022-05-26 DIAGNOSIS — K760 Fatty (change of) liver, not elsewhere classified: Secondary | ICD-10-CM | POA: Diagnosis not present

## 2022-05-26 DIAGNOSIS — H5442A4 Blindness left eye category 4, normal vision right eye: Secondary | ICD-10-CM | POA: Diagnosis not present

## 2022-05-26 DIAGNOSIS — Z0000 Encounter for general adult medical examination without abnormal findings: Secondary | ICD-10-CM | POA: Diagnosis not present

## 2022-05-26 DIAGNOSIS — E1142 Type 2 diabetes mellitus with diabetic polyneuropathy: Secondary | ICD-10-CM | POA: Diagnosis not present

## 2022-06-07 ENCOUNTER — Ambulatory Visit (INDEPENDENT_AMBULATORY_CARE_PROVIDER_SITE_OTHER): Payer: Medicare Other | Admitting: Interventional Cardiology

## 2022-06-07 ENCOUNTER — Encounter: Payer: Self-pay | Admitting: Interventional Cardiology

## 2022-06-07 VITALS — BP 118/70 | HR 85 | Ht 69.5 in | Wt 254.0 lb

## 2022-06-07 DIAGNOSIS — R002 Palpitations: Secondary | ICD-10-CM | POA: Diagnosis not present

## 2022-06-07 DIAGNOSIS — R0602 Shortness of breath: Secondary | ICD-10-CM

## 2022-06-07 DIAGNOSIS — E782 Mixed hyperlipidemia: Secondary | ICD-10-CM

## 2022-06-07 NOTE — Progress Notes (Signed)
Cardiology Office Note   Date:  06/07/2022   ID:  Katrina Wolfe, DOB 08/20/1966, MRN [id number]  PCP:  Katrina Low, MD    No chief complaint on file.  Palpitations  Wt Readings from Last 3 Encounters:  06/07/22 254 lb (115.2 kg)  02/09/22 267 lb (121.1 kg)  11/30/21 267 lb (121.1 kg)       History of Present Illness: Katrina Wolfe is a 56 y.o. female who is being seen today for the evaluation of palpitations at the request of Katrina Basques, MD.  She had a plate removed from her left foot in 2020.  Sx have been occasion since that time.  Sx last 3-5 seconds.  Feels like a racing and then it stops, and then goes back to normal on its own.  No meds taken.  Tries to relax to help resolve the symptoms.  Stress is the Trigger that can cause the symptoms.  Episodes are about once a month.  She has associated shortness of breath.  Denies : Chest pain.  Leg edema. Nitroglycerin use. Orthopnea. Palpitations. Paroxysmal nocturnal dyspnea.  Syncope.    Walking limited by left foot injury.  She requires another surgery.  Complicated by infection. She can do chair exercises.     Past Medical History:  Diagnosis Date   Abnormal uterine bleeding (AUB)    Arthritis    knees, fingers   Asthma    as child, no problems in 20 yrs   Cancer ([place])    CKD (chronic kidney disease), stage II    Congenital cardiomegaly    per pt has always been told since childhood and siblings    Diabetes mellitus without complication (HCC)    GERD (gastroesophageal reflux disease)    History of genital warts    HIV (human immunodeficiency virus infection) ([place])    DX 1998--  MONITORED BY INFECTIOUS DISEASE-- DR Katrina Wolfe   HIV (human immunodeficiency virus infection) ([place])    Hypercholesterolemia    Hypertension    Intermittent palpitations    mild-- no meds   Legally blind in left eye, as defined in [place]    trauma as child   Peripheral vascular disease ([place])    Renal disorder     Tuberculosis    ? 2005 or 2007   Type 2 diabetes mellitus ([place])    Type II    Uterine fibroid    VIN III (vulvar intraepithelial neoplasia III)    and Verrucoid lesion on the mons   Wears glasses     Past Surgical History:  Procedure Laterality Date   CESAREAN SECTION  2001  &  2725   CO2 LASER APPLICATION N/A 36/64/4034   Procedure: CO2 LASER APPLICATION AND WIDE VOCAL EXCSION OF LESION ON MONS PUBIS;  Surgeon: Marti Sleigh, MD;  Location: [facility];  Service: Gynecology;  Laterality: N/A;   CASE CANCELLED    CO2 LASER APPLICATION N/A 74/25/9563   Procedure: CO2 LASER OF VULVAR;  Surgeon: Marti Sleigh, MD;  Location: [facility];  Service: Gynecology;  Laterality: N/A;   CORNEAL TRANSPLANT Left 12/18/2004   failed   D & C HYSTEROSCOPY /  RESECTION FIBROID  12/18/2002   DILATATION & CURETTAGE/HYSTEROSCOPY WITH MYOSURE N/A 10/01/2019   Procedure: DILATATION & CURETTAGE/HYSTEROSCOPY WITH MYOSURE and HYDROTHERMAL ABLATION;  Surgeon: Thurnell Lose, MD;  Location: [facility];  Service: Gynecology;  Laterality: N/A;  HTA rep will be here. Confirmed on 09/25/19 CS  EYE SURGERY     FOOT ARTHRODESIS Left 11/30/2021   Procedure: LEFT TIBIOCALCANEAL FUSION;  Surgeon: Newt Minion, MD;  Location: [facility];  Service: Orthopedics;  Laterality: Left;   HARDWARE REMOVAL Left 04/04/2019   Procedure: EXCISION BONE AND REMOVE DEEP HARDWARE LEFT 5TH METATARSAL;  Surgeon: Newt Minion, MD;  Location: [facility];  Service: Orthopedics;  Laterality: Left;   I & D EXTREMITY Left 06/27/2021   Procedure: IRRIGATION AND DEBRIDEMENT FOOT ULCER;  Surgeon: Jessy Oto, MD;  Location: [facility];  Service: Orthopedics;  Laterality: Left;   KNEE ARTHROSCOPY W/ ACL RECONSTRUCTION Bilateral 12/18/2006   ORIF TOE FRACTURE Left 08/02/2017   Procedure: OPEN REDUCTION INTERNAL FIXATION (ORIF) FIFTH METATARSAL (TOE) BASE FRACTURE;  Surgeon: Wylene Simmer, MD;   Location: [facility];  Service: Orthopedics;  Laterality: Left;   VULVECTOMY N/A 01/07/2016   Procedure: WIDE LOCAL EXCISION;  Surgeon: Marti Sleigh, MD;  Location[facility];  Service: Gynecology;  Laterality: N/A;  mons pubis as site     Current Outpatient Medications  Medication Sig Dispense Refill   acetaminophen (TYLENOL) 500 MG tablet Take 1,000 mg by mouth every 6 (six) hours as needed for moderate pain.     amLODipine (NORVASC) 5 MG tablet Take 5 mg by mouth in the morning.     aspirin EC 81 MG tablet Take 81 mg by mouth daily. Swallow whole.     atorvastatin (LIPITOR) 10 MG tablet Take 10 mg by mouth daily.     calcium carbonate (TUMS - DOSED IN MG ELEMENTAL CALCIUM) 500 MG chewable tablet Chew 1 tablet by mouth as needed for indigestion or heartburn.     Calcium Carbonate-Vitamin D 600-200 MG-UNIT TABS Take 1 tablet by mouth daily.     COMFORT EZ PEN NEEDLES 31G X 8 MM MISC USE WITH insulin THREE TIMES DAILY AS DIRECTED     diphenhydrAMINE (BENADRYL) 25 MG tablet Take 25 mg by mouth every 6 (six) hours as needed for allergies (or allergic reactions).     dolutegravir-lamiVUDine (DOVATO) 50-300 MG tablet Take 1 tablet by mouth daily. 30 tablet 11   FARXIGA 10 MG TABS tablet Take 10 mg by mouth in the morning.     ferrous sulfate 325 (65 FE) MG EC tablet Take 325 mg by mouth daily with breakfast.     gabapentin (NEURONTIN) 100 MG capsule Take 100 mg by mouth 4 (four) times daily.     glimepiride (AMARYL) 2 MG tablet Take 2 mg by mouth See admin instructions. Take 4 mg by mouth in the morning and 2 mg in the evening     HUMALOG KWIKPEN 100 UNIT/ML KwikPen Inject 10-15 Units into the skin daily with lunch.     HUMALOG MIX 75/25 KWIKPEN (75-25) 100 UNIT/ML Kwikpen Inject 60 Units into the skin 2 (two) times daily.     HYDROcodone-acetaminophen (NORCO/VICODIN) 5-325 MG tablet Take 1 tablet by mouth every 4 (four) hours as needed. 30 tablet 0    Insulin Syringe-Needle U-100 (INSULIN SYRINGE 1CC/31GX5/16") 31G X 5/16" 1 ML MISC 1 Units by Does not apply route 2 (two) times daily. 30 each 0   Lancets (ONETOUCH ULTRASOFT) lancets      lisinopril-hydrochlorothiazide (ZESTORETIC) 20-25 MG tablet Take 1 tablet by mouth daily.     methocarbamol (ROBAXIN) 500 MG tablet Take 1 tablet (500 mg total) by mouth every 8 (eight) hours as needed for muscle spasms. 30 tablet 0   norethindrone (AYGESTIN) 5  MG tablet Take 5 mg by mouth in the morning.     ONE TOUCH ULTRA TEST test strip      traMADol (ULTRAM) 50 MG tablet Take 50 mg by mouth every 4 (four) hours as needed for moderate pain.     TRULICITY 3 EL/3.8BO SOPN SMARTSIG:1 Pre-Filled Pen Syringe SUB-Q Once a Week     No current facility-administered medications for this visit.    Allergies:   Shellfish allergy and Bactrim [sulfamethoxazole-trimethoprim]    Social History:  The patient  reports that she has never smoked. She has never used smokeless tobacco. She reports that she does not drink alcohol and does not use drugs.   Family History:  The patient's family history includes Breast cancer in her cousin; Cancer in her brother; Diabetes in her father; Hypertension in her mother.    ROS:  Please see the history of present illness.   Otherwise, review of systems are positive for palpitations.   All other systems are reviewed and negative.    PHYSICAL EXAM: VS:  BP 118/70   Pulse 85   Ht 5' 9.5" (1.765 m)   Wt 254 lb (115.2 kg)   SpO2 99%   BMI 36.97 kg/m  , BMI Body mass index is 36.97 kg/m. GEN: Well nourished, well developed, in no acute distress HEENT: normal Neck: no JVD, carotid bruits, or masses Cardiac: RRR; no murmurs, rubs, or gallops,no edema  Respiratory:  clear to auscultation bilaterally, normal work of breathing GI: soft, nontender, nondistended, + BS MS: no deformity or atrophy Skin: warm and dry, no rash Neuro:  Strength and sensation are intact Psych: euthymic  mood, full affect   EKG:   The ekg ordered today demonstrates NSR with PVC- symptoms correlated to PVC.   Recent Labs: 07/04/2021: Magnesium 1.6 02/09/2022: ALT 25; BUN 19; Creat 1.63; Hemoglobin 15.1; Platelets 417; Potassium 4.1; Sodium 138   Lipid Panel    Component Value Date/Time   CHOL 118 06/27/2021 0221   TRIG 122 06/27/2021 0221   HDL 29 (L) 06/27/2021 0221   CHOLHDL 4.1 06/27/2021 0221   VLDL 24 06/27/2021 0221   LDLCALC 65 06/27/2021 0221   LDLCALC 78 05/19/2021 0851     Other studies Reviewed: Additional studies/ records that were reviewed today with results demonstrating: labs reviewed, Cr 1.67.   ASSESSMENT AND PLAN:  Palpitations: Premature beats noted on the exam correlate to the symptoms, and to the ECG finding as well.  Benign, Wolfe risk rhythm.  Sx in the office felt exactly like what she had at home. No need for ambulatory monitor Shortness of breath: would check echocardiogram as well.  No signs of fluid overload.  Cardiomyopathy can be associated with HIV. Hyperlipidemia: LDL 144 off of lipitor. Had elevated LFTs in the past.  Lipitor was restarted.  Could consider using a different statin like rosuvastatin if liver test abnormalities are a persistent problem.  Whole food, plant-based diet recommended.  Avoid processed foods.  Current medicines are reviewed at length with the patient today.  The patient concerns regarding her medicines were addressed.  The following changes have been made:  No change  Labs/ tests ordered today include:  No orders of the defined types were placed in this encounter.   Recommend 150 minutes/week of aerobic exercise Wolfe fat, Wolfe carb, high fiber diet recommended  Disposition:   FU as needed   Signed, Larae Grooms, MD  06/07/2022 10:13 AM    [facility]  [address], [place], [place]  [address] Phone: [phone]; Fax: [phone]

## 2022-06-07 NOTE — Patient Instructions (Signed)
Medication Instructions:  ?Your physician recommends that you continue on your current medications as directed. Please refer to the Current Medication list given to you today. ? ?*If you need a refill on your cardiac medications before your next appointment, please call your pharmacy* ? ? ?Lab Work: ?none ?If you have labs (blood work) drawn today and your tests are completely normal, you will receive your results only by: ?MyChart Message (if you have MyChart) OR ?A paper copy in the mail ?If you have any lab test that is abnormal or we need to change your treatment, we will call you to review the results. ? ? ?Testing/Procedures: ?Your physician has requested that you have an echocardiogram. Echocardiography is a painless test that uses sound waves to create images of your heart. It provides your doctor with information about the size and shape of your heart and how well your heart?s chambers and valves are working. This procedure takes approximately one hour. There are no restrictions for this procedure. ? ? ? ?Follow-Up: ?At [facility], you and your health needs are our priority.  As part of our continuing mission to provide you with exceptional heart care, we have created designated Provider Care Teams.  These Care Teams include your primary Cardiologist (physician) and Advanced Practice Providers (APPs -  Physician Assistants and Nurse Practitioners) who all work together to provide you with the care you need, when you need it. ? ?We recommend signing up for the patient portal called "MyChart".  Sign up information is provided on this After Visit Summary.  MyChart is used to connect with patients for Virtual Visits (Telemedicine).  Patients are able to view lab/test results, encounter notes, upcoming appointments, etc.  Non-urgent messages can be sent to your provider as well.   ?To learn more about what you can do with MyChart, go to [email or web address].   ? ?Your next appointment:   ?As needed ? ?The  format for your next appointment:   ?In Person ? ?Provider:   ?Jayadeep Varanasi, MD   ? ? ?Other Instructions ?  ? ?Important Information About Sugar ? ? ? ? ? ? ?

## 2022-06-16 DIAGNOSIS — I10 Essential (primary) hypertension: Secondary | ICD-10-CM | POA: Diagnosis not present

## 2022-06-16 DIAGNOSIS — E1140 Type 2 diabetes mellitus with diabetic neuropathy, unspecified: Secondary | ICD-10-CM | POA: Diagnosis not present

## 2022-06-16 DIAGNOSIS — E7800 Pure hypercholesterolemia, unspecified: Secondary | ICD-10-CM | POA: Diagnosis not present

## 2022-06-16 DIAGNOSIS — N1831 Chronic kidney disease, stage 3a: Secondary | ICD-10-CM | POA: Diagnosis not present

## 2022-06-27 ENCOUNTER — Ambulatory Visit (HOSPITAL_COMMUNITY): Payer: Medicare Other | Attending: Cardiovascular Disease

## 2022-06-27 DIAGNOSIS — R002 Palpitations: Secondary | ICD-10-CM | POA: Diagnosis not present

## 2022-06-27 DIAGNOSIS — R0602 Shortness of breath: Secondary | ICD-10-CM | POA: Insufficient documentation

## 2022-06-27 LAB — ECHOCARDIOGRAM COMPLETE
Area-P 1/2: 7.16 cm2
S' Lateral: 3 cm

## 2022-07-24 DIAGNOSIS — N1831 Chronic kidney disease, stage 3a: Secondary | ICD-10-CM | POA: Diagnosis not present

## 2022-08-03 ENCOUNTER — Other Ambulatory Visit: Payer: Self-pay

## 2022-08-03 ENCOUNTER — Encounter (HOSPITAL_COMMUNITY): Payer: Self-pay | Admitting: Orthopedic Surgery

## 2022-08-03 NOTE — Progress Notes (Addendum)
I left a voice on Ms Carlino's voice mail; patient's name was on the voice mail message,  I instructed patient to hold  [place] today.  I asked patient to call me back at the number I left for her.  Ms Grieco returned my call, she denies chest pain, palpations or shortness of breath, patient denies having any s/s of Covid in her household, patient also denies any known exposure to Covid.   Ms Deike 's PCP is Dr Raliegh Ip. Deforest Hoyles. Ms Gadison saw Dr. Irish Lack echo was  done, it was  normal. Dr. Irish Lack and patient identified that palpations happen when she is stressed, no changes were made to her medications, patient should follow with Dr. Irish Lack when needed.  Ms Belleau has type II diabetes, patient reports that CBGs have been running in the 49's in the am.  Ms Willadean Carol did not take Phineas Real this am. I instructed patient to take 70% of Novolog 75/25 insulin at the evening dose today.  I also instructed patient to not take 2nd dose of  Glimepride today. Do not take Trulicity or Farxiga in am.  I instructed patient to check CBG after awaking and every 2 hours until arrival  to the hospital.  I Instructed patient if CBG is less than 70 to take 4 Glucose Tablets or 1 tube of Glucose Gel or 1/2 cup of a clear juice. Recheck CBG in 15 minutes if CBG is not over 70 call, pre- op desk at [phone] for further instructions.

## 2022-08-04 ENCOUNTER — Ambulatory Visit (HOSPITAL_COMMUNITY): Payer: Medicare Other | Admitting: Anesthesiology

## 2022-08-04 ENCOUNTER — Ambulatory Visit (HOSPITAL_BASED_OUTPATIENT_CLINIC_OR_DEPARTMENT_OTHER): Payer: Medicare Other | Admitting: Anesthesiology

## 2022-08-04 ENCOUNTER — Ambulatory Visit (HOSPITAL_COMMUNITY)
Admission: RE | Admit: 2022-08-04 | Discharge: 2022-08-04 | Disposition: A | Discharge status: Home / Self Care | Payer: Medicare Other | Source: Ambulatory Visit | Attending: Orthopedic Surgery | Admitting: Orthopedic Surgery

## 2022-08-04 ENCOUNTER — Encounter (HOSPITAL_COMMUNITY): Payer: Self-pay | Admitting: Orthopedic Surgery

## 2022-08-04 ENCOUNTER — Encounter (HOSPITAL_COMMUNITY)
Admission: RE | Disposition: A | Discharge status: Home / Self Care | Payer: Self-pay | Source: Ambulatory Visit | Attending: Orthopedic Surgery

## 2022-08-04 ENCOUNTER — Ambulatory Visit (HOSPITAL_COMMUNITY): Payer: Medicare Other

## 2022-08-04 DIAGNOSIS — Z981 Arthrodesis status: Secondary | ICD-10-CM | POA: Diagnosis not present

## 2022-08-04 DIAGNOSIS — E669 Obesity, unspecified: Secondary | ICD-10-CM | POA: Insufficient documentation

## 2022-08-04 DIAGNOSIS — T84098A Other mechanical complication of other internal joint prosthesis, initial encounter: Secondary | ICD-10-CM | POA: Diagnosis not present

## 2022-08-04 DIAGNOSIS — Z7984 Long term (current) use of oral hypoglycemic drugs: Secondary | ICD-10-CM | POA: Diagnosis not present

## 2022-08-04 DIAGNOSIS — N182 Chronic kidney disease, stage 2 (mild): Secondary | ICD-10-CM | POA: Diagnosis not present

## 2022-08-04 DIAGNOSIS — Z794 Long term (current) use of insulin: Secondary | ICD-10-CM

## 2022-08-04 DIAGNOSIS — Y793 Surgical instruments, materials and orthopedic devices (including sutures) associated with adverse incidents: Secondary | ICD-10-CM | POA: Diagnosis not present

## 2022-08-04 DIAGNOSIS — T8484XA Pain due to internal orthopedic prosthetic devices, implants and grafts, initial encounter: Secondary | ICD-10-CM | POA: Diagnosis not present

## 2022-08-04 DIAGNOSIS — Z21 Asymptomatic human immunodeficiency virus [HIV] infection status: Secondary | ICD-10-CM | POA: Insufficient documentation

## 2022-08-04 DIAGNOSIS — E785 Hyperlipidemia, unspecified: Secondary | ICD-10-CM | POA: Diagnosis not present

## 2022-08-04 DIAGNOSIS — E1151 Type 2 diabetes mellitus with diabetic peripheral angiopathy without gangrene: Secondary | ICD-10-CM | POA: Diagnosis not present

## 2022-08-04 DIAGNOSIS — E1122 Type 2 diabetes mellitus with diabetic chronic kidney disease: Secondary | ICD-10-CM | POA: Diagnosis not present

## 2022-08-04 DIAGNOSIS — E1165 Type 2 diabetes mellitus with hyperglycemia: Secondary | ICD-10-CM | POA: Diagnosis not present

## 2022-08-04 DIAGNOSIS — I10 Essential (primary) hypertension: Secondary | ICD-10-CM | POA: Diagnosis not present

## 2022-08-04 DIAGNOSIS — I129 Hypertensive chronic kidney disease with stage 1 through stage 4 chronic kidney disease, or unspecified chronic kidney disease: Secondary | ICD-10-CM | POA: Diagnosis not present

## 2022-08-04 DIAGNOSIS — Z6836 Body mass index (BMI) 36.0-36.9, adult: Secondary | ICD-10-CM | POA: Insufficient documentation

## 2022-08-04 DIAGNOSIS — E11610 Type 2 diabetes mellitus with diabetic neuropathic arthropathy: Secondary | ICD-10-CM | POA: Diagnosis not present

## 2022-08-04 HISTORY — DX: Pneumonia, unspecified organism: J18.9

## 2022-08-04 HISTORY — PX: HARDWARE REMOVAL: SHX979

## 2022-08-04 HISTORY — DX: Other specified abnormal findings of blood chemistry: R79.89

## 2022-08-04 LAB — CBC
HCT: 48.8 % — ABNORMAL HIGH (ref 36.0–46.0)
Hemoglobin: 16.1 g/dL — ABNORMAL HIGH (ref 12.0–15.0)
MCH: 33.1 pg (ref 26.0–34.0)
MCHC: 33 g/dL (ref 30.0–36.0)
MCV: 100.2 fL — ABNORMAL HIGH (ref 80.0–100.0)
Platelets: 370 10*3/uL (ref 150–400)
RBC: 4.87 MIL/uL (ref 3.87–5.11)
RDW: 14.7 % (ref 11.5–15.5)
WBC: 10.6 10*3/uL — ABNORMAL HIGH (ref 4.0–10.5)
nRBC: 0 % (ref 0.0–0.2)

## 2022-08-04 LAB — COMPREHENSIVE METABOLIC PANEL
ALT: 92 U/L — ABNORMAL HIGH (ref 0–44)
AST: 53 U/L — ABNORMAL HIGH (ref 15–41)
Albumin: 3.5 g/dL (ref 3.5–5.0)
Alkaline Phosphatase: 46 U/L (ref 38–126)
Anion gap: 10 (ref 5–15)
BUN: 15 mg/dL (ref 6–20)
CO2: 23 mmol/L (ref 22–32)
Calcium: 9 mg/dL (ref 8.9–10.3)
Chloride: 105 mmol/L (ref 98–111)
Creatinine, Ser: 1.47 mg/dL — ABNORMAL HIGH (ref 0.44–1.00)
GFR, Estimated: 42 mL/min — ABNORMAL LOW (ref >60)
Glucose, Bld: 145 mg/dL — ABNORMAL HIGH (ref 70–99)
Potassium: 3.7 mmol/L (ref 3.5–5.1)
Sodium: 138 mmol/L (ref 135–145)
Total Bilirubin: 0.4 mg/dL (ref 0.3–1.2)
Total Protein: 7.5 g/dL (ref 6.5–8.1)

## 2022-08-04 LAB — GLUCOSE, CAPILLARY
Glucose-Capillary: 173 mg/dL — ABNORMAL HIGH (ref 70–99)
Glucose-Capillary: 112 mg/dL — ABNORMAL HIGH (ref 70–99)

## 2022-08-04 SURGERY — REMOVAL, HARDWARE
Anesthesia: General | Site: Ankle | Laterality: Left

## 2022-08-04 MED ORDER — MIDAZOLAM HCL 2 MG/2ML IJ SOLN
INTRAMUSCULAR | Status: DC | PRN
  Administered 2022-08-04: 2 mg via INTRAVENOUS

## 2022-08-04 MED ORDER — CEFAZOLIN SODIUM-DEXTROSE 2-4 GM/100ML-% IV SOLN
2.0000 g | INTRAVENOUS | Status: AC
  Administered 2022-08-04: 2 g via INTRAVENOUS
  Filled 2022-08-04: qty 100

## 2022-08-04 MED ORDER — LIDOCAINE 2% (20 MG/ML) 5 ML SYRINGE
INTRAMUSCULAR | Status: DC | PRN
  Administered 2022-08-04: 80 mg via INTRAVENOUS

## 2022-08-04 MED ORDER — CHLORHEXIDINE GLUCONATE 0.12 % MT SOLN
OROMUCOSAL | Status: AC
  Administered 2022-08-04: 15 mL via OROMUCOSAL
  Filled 2022-08-04: qty 15

## 2022-08-04 MED ORDER — LACTATED RINGERS IV SOLN: INTRAVENOUS | Status: DC

## 2022-08-04 MED ORDER — HYDROMORPHONE HCL 1 MG/ML IJ SOLN: 0.2500 mg | INTRAMUSCULAR | Status: DC | PRN

## 2022-08-04 MED ORDER — ORAL CARE MOUTH RINSE: 15.0000 mL | Freq: Once | OROMUCOSAL | Status: AC

## 2022-08-04 MED ORDER — MIDAZOLAM HCL 2 MG/2ML IJ SOLN
INTRAMUSCULAR | Status: AC
  Filled 2022-08-04: qty 2

## 2022-08-04 MED ORDER — OXYCODONE HCL 5 MG/5ML PO SOLN: 5.0000 mg | Freq: Once | ORAL | Status: DC | PRN

## 2022-08-04 MED ORDER — PROPOFOL 10 MG/ML IV BOLUS
INTRAVENOUS | Status: AC
  Filled 2022-08-04: qty 20

## 2022-08-04 MED ORDER — FENTANYL CITRATE (PF) 250 MCG/5ML IJ SOLN
INTRAMUSCULAR | Status: AC
  Filled 2022-08-04: qty 5

## 2022-08-04 MED ORDER — ONDANSETRON HCL 4 MG/2ML IJ SOLN: 4.0000 mg | Freq: Once | INTRAMUSCULAR | Status: DC | PRN

## 2022-08-04 MED ORDER — INSULIN ASPART 100 UNIT/ML IJ SOLN
0.0000 [IU] | INTRAMUSCULAR | Status: DC | PRN
  Administered 2022-08-04: 2 [IU] via SUBCUTANEOUS
  Filled 2022-08-04: qty 1

## 2022-08-04 MED ORDER — ONDANSETRON HCL 4 MG/2ML IJ SOLN
INTRAMUSCULAR | Status: DC | PRN
  Administered 2022-08-04: 4 mg via INTRAVENOUS

## 2022-08-04 MED ORDER — FENTANYL CITRATE (PF) 250 MCG/5ML IJ SOLN
INTRAMUSCULAR | Status: DC | PRN
Start: 2022-08-04 — End: 2022-08-04
  Administered 2022-08-04: 50 ug via INTRAVENOUS
  Administered 2022-08-04: 100 ug via INTRAVENOUS

## 2022-08-04 MED ORDER — 0.9 % SODIUM CHLORIDE (POUR BTL) OPTIME
TOPICAL | Status: DC | PRN
  Administered 2022-08-04: 1000 mL

## 2022-08-04 MED ORDER — OXYCODONE-ACETAMINOPHEN 5-325 MG PO TABS: 1.0000 | ORAL_TABLET | ORAL | 0 refills | Status: DC | PRN

## 2022-08-04 MED ORDER — OXYCODONE HCL 5 MG PO TABS: 5.0000 mg | ORAL_TABLET | Freq: Once | ORAL | Status: DC | PRN

## 2022-08-04 MED ORDER — CHLORHEXIDINE GLUCONATE 0.12 % MT SOLN: 15.0000 mL | Freq: Once | OROMUCOSAL | Status: AC

## 2022-08-04 MED ORDER — PROPOFOL 10 MG/ML IV BOLUS
INTRAVENOUS | Status: DC | PRN
  Administered 2022-08-04: 150 mg via INTRAVENOUS

## 2022-08-04 MED ORDER — PHENYLEPHRINE 80 MCG/ML (10ML) SYRINGE FOR IV PUSH (FOR BLOOD PRESSURE SUPPORT)
PREFILLED_SYRINGE | INTRAVENOUS | Status: DC | PRN
  Administered 2022-08-04 (×2): 80 ug via INTRAVENOUS
  Administered 2022-08-04: 160 ug via INTRAVENOUS
  Administered 2022-08-04: 80 ug via INTRAVENOUS

## 2022-08-04 SURGICAL SUPPLY — 44 items
BAG COUNTER SPONGE SURGICOUNT (BAG) ×1 IMPLANT
BANDAGE ESMARK 6X9 LF (GAUZE/BANDAGES/DRESSINGS) IMPLANT
BNDG COHESIVE 4X5 TAN STRL (GAUZE/BANDAGES/DRESSINGS) IMPLANT
BNDG ESMARK 6X9 LF (GAUZE/BANDAGES/DRESSINGS)
BNDG GAUZE DERMACEA FLUFF (GAUZE/BANDAGES/DRESSINGS) ×1
BNDG GAUZE DERMACEA FLUFF 4 (GAUZE/BANDAGES/DRESSINGS) IMPLANT
BNDG GAUZE ELAST 4 BULKY (GAUZE/BANDAGES/DRESSINGS) ×1 IMPLANT
COVER SURGICAL LIGHT HANDLE (MISCELLANEOUS) ×2 IMPLANT
CUFF TOURN SGL QUICK 34 (TOURNIQUET CUFF)
CUFF TOURN SGL QUICK 42 (TOURNIQUET CUFF) IMPLANT
CUFF TRNQT CYL 34X4.125X (TOURNIQUET CUFF) IMPLANT
DRAPE C-ARM 42X72 X-RAY (DRAPES) IMPLANT
DRAPE INCISE IOBAN 66X45 STRL (DRAPES) IMPLANT
DRAPE ORTHO SPLIT 77X108 STRL (DRAPES)
DRAPE SURG ORHT 6 SPLT 77X108 (DRAPES) IMPLANT
DRAPE U-SHAPE 47X51 STRL (DRAPES) ×1 IMPLANT
DRSG EMULSION OIL 3X3 NADH (GAUZE/BANDAGES/DRESSINGS) ×1 IMPLANT
DRSG PAD ABDOMINAL 8X10 ST (GAUZE/BANDAGES/DRESSINGS) ×1 IMPLANT
ELECT REM PT RETURN 9FT ADLT (ELECTROSURGICAL) ×1
ELECTRODE REM PT RTRN 9FT ADLT (ELECTROSURGICAL) ×1 IMPLANT
GAUZE SPONGE 4X4 12PLY STRL (GAUZE/BANDAGES/DRESSINGS) ×1 IMPLANT
GLOVE BIOGEL PI IND STRL 9 (GLOVE) ×1 IMPLANT
GLOVE BIOGEL PI INDICATOR 9 (GLOVE) ×1
GLOVE SURG ORTHO 9.0 STRL STRW (GLOVE) ×1 IMPLANT
GOWN STRL REUS W/ TWL XL LVL3 (GOWN DISPOSABLE) ×3 IMPLANT
GOWN STRL REUS W/TWL XL LVL3 (GOWN DISPOSABLE) ×3
KIT BASIN OR (CUSTOM PROCEDURE TRAY) ×1 IMPLANT
KIT TURNOVER KIT B (KITS) ×1 IMPLANT
MANIFOLD NEPTUNE II (INSTRUMENTS) ×1 IMPLANT
NS IRRIG 1000ML POUR BTL (IV SOLUTION) ×1 IMPLANT
PACK ORTHO EXTREMITY (CUSTOM PROCEDURE TRAY) ×1 IMPLANT
PAD ARMBOARD 7.5X6 YLW CONV (MISCELLANEOUS) ×2 IMPLANT
SPONGE T-LAP 18X18 ~~LOC~~+RFID (SPONGE) IMPLANT
STAPLER VISISTAT 35W (STAPLE) IMPLANT
STOCKINETTE IMPERVIOUS 9X36 MD (GAUZE/BANDAGES/DRESSINGS) IMPLANT
SUT ETHILON 2 0 PSLX (SUTURE) IMPLANT
SUT VIC AB 0 CT1 27 (SUTURE) ×1
SUT VIC AB 0 CT1 27XBRD ANBCTR (SUTURE) IMPLANT
SUT VIC AB 2-0 CT1 27 (SUTURE) ×1
SUT VIC AB 2-0 CT1 TAPERPNT 27 (SUTURE) IMPLANT
TOWEL GREEN STERILE (TOWEL DISPOSABLE) ×1 IMPLANT
TOWEL GREEN STERILE FF (TOWEL DISPOSABLE) ×1 IMPLANT
UNDERPAD 30X36 HEAVY ABSORB (UNDERPADS AND DIAPERS) ×1 IMPLANT
WATER STERILE IRR 1000ML POUR (IV SOLUTION) ×1 IMPLANT

## 2022-08-04 NOTE — Anesthesia Postprocedure Evaluation (Signed)
Anesthesia Post Note  Patient: Katrina Wolfe  Procedure(s) Performed: REMOVAL DEEP HARDWARE LEFT ANKLE (Left: Ankle)     Patient location during evaluation: PACU Anesthesia Type: General Level of consciousness: awake and alert and oriented Pain management: pain level controlled Vital Signs Assessment: post-procedure vital signs reviewed and stable Respiratory status: spontaneous breathing, nonlabored ventilation and respiratory function stable Cardiovascular status: blood pressure returned to baseline and stable Postop Assessment: no apparent nausea or vomiting Anesthetic complications: no   No notable events documented.  Last Vitals:  Vitals:   08/04/22 1000 08/04/22 1015  BP: 138/83 125/78  Pulse: 74 78  Resp: 18 18  Temp:  36.6 C  SpO2: 95% 96%    Last Pain:  Vitals:   08/04/22 1015  TempSrc:   PainSc: 0-No pain                 Tyke Outman A.

## 2022-08-04 NOTE — H&P (Signed)
Katrina Wolfe is an 56 y.o. female.   Chief Complaint: Painful deep retained hardware HPI: Katrina Wolfe is a 56 year old woman who is 5 months status post left tibial calcaneal fusion.  Katrina Wolfe still has prominent hardware anteriorly from hardware failure.  Past Medical Katrina Wolfe:  Katrina Wolfe Date   Katrina Wolfe (AUB)    Arthritis    knees, fingers   Asthma    as child, no problem since 1995   CKD (chronic kidney disease), stage II    Congenital cardiomegaly    per pt has always been told since childhood and siblings    Diabetes mellitus without complication ([place])    Elevated liver function tests    GERD (gastroesophageal reflux disease)    Katrina Wolfe of genital warts    HIV (human immunodeficiency virus infection) ([place])    DX 1998--  MONITORED BY INFECTIOUS DISEASE-- DR Carlyle Basques   Hypercholesterolemia    Hypertension    Intermittent palpitations    mild-- no meds   Legally blind in left eye, as defined in [place]    trauma as child   Peripheral vascular disease ([place])    Pneumonia    Renal disorder    Tuberculosis    ? 2005 or 2007   Type 2 diabetes mellitus ([name])    Type II    Uterine fibroid    VIN III (vulvar intraepithelial neoplasia III)    and Verrucoid lesion on the mons   Wears glasses     Past Surgical Katrina Wolfe:  Procedure Laterality Date   CESAREAN SECTION  2001  &  7846   CO2 LASER APPLICATION N/A 96/29/5284   Procedure: CO2 LASER APPLICATION AND WIDE VOCAL EXCSION OF LESION ON MONS PUBIS;  Surgeon: Marti Sleigh, MD;  Location: [place];  Service: Gynecology;  Laterality: N/A;   CASE CANCELLED    CO2 LASER APPLICATION N/A 13/24/4010   Procedure: CO2 LASER OF VULVAR;  Surgeon: Marti Sleigh, MD;  Location: [facility];  Service: Gynecology;  Laterality: N/A;   CORNEAL TRANSPLANT Left 12/18/2004   failed   D & C HYSTEROSCOPY /  RESECTION FIBROID  12/18/2002   DILATATION & CURETTAGE/HYSTEROSCOPY WITH  MYOSURE N/A 10/01/2019   Procedure: DILATATION & CURETTAGE/HYSTEROSCOPY WITH MYOSURE and HYDROTHERMAL ABLATION;  Surgeon: Thurnell Lose, MD;  Location: [place];  Service: Gynecology;  Laterality: N/A;  HTA rep will be here. Confirmed on 09/25/19 CS   EYE SURGERY     FOOT ARTHRODESIS Left 11/30/2021   Procedure: LEFT TIBIOCALCANEAL FUSION;  Surgeon: Newt Minion, MD;  Location: [place];  Service: Orthopedics;  Laterality: Left;   HARDWARE REMOVAL Left 04/04/2019   Procedure: EXCISION BONE AND REMOVE DEEP HARDWARE LEFT 5TH METATARSAL;  Surgeon: Newt Minion, MD;  Location: [place];  Service: Orthopedics;  Laterality: Left;   I & D EXTREMITY Left 06/27/2021   Procedure: IRRIGATION AND DEBRIDEMENT FOOT ULCER;  Surgeon: Jessy Oto, MD;  Location: [place];  Service: Orthopedics;  Laterality: Left;   KNEE ARTHROSCOPY W/ ACL RECONSTRUCTION Bilateral 12/18/2006   ORIF TOE FRACTURE Left 08/02/2017   Procedure: OPEN REDUCTION INTERNAL FIXATION (ORIF) FIFTH METATARSAL (TOE) BASE FRACTURE;  Surgeon: Wylene Simmer, MD;  Location: [place];  Service: Orthopedics;  Laterality: Left;   VULVECTOMY N/A 01/07/2016   Procedure: WIDE LOCAL EXCISION;  Surgeon: Marti Sleigh, MD;  Location: [facility];  Service: Gynecology;  Laterality: N/A;  mons pubis as site    Family  Katrina Wolfe  Problem Relation Age of Onset   Hypertension Mother    Diabetes Father    Cancer Brother    Breast cancer Cousin    Social Katrina Wolfe:  reports that she has never smoked. She has never used smokeless tobacco. She reports that she does not drink alcohol and does not use drugs.  Allergies:  Allergies  Allergen Reactions   Shellfish Allergy Anaphylaxis    All types shellfish   Bactrim [Sulfamethoxazole-Trimethoprim] Hives    Medications Prior to Admission  Medication Sig Dispense Refill   acetaminophen (TYLENOL) 500 MG tablet Take 1,000 mg by mouth every 6 (six) hours as  needed for moderate pain.     amLODipine (NORVASC) 5 MG tablet Take 5 mg by mouth in the morning.     aspirin EC 81 MG tablet Take 81 mg by mouth daily. Swallow whole.     calcium carbonate (TUMS - DOSED IN MG ELEMENTAL CALCIUM) 500 MG chewable tablet Chew 1 tablet by mouth as needed for indigestion or heartburn.     Calcium Carbonate-Vitamin D 600-200 MG-UNIT TABS Take 1 tablet by mouth daily.     diphenhydrAMINE (BENADRYL) 25 MG tablet Take 25 mg by mouth every 6 (six) hours as needed for allergies (or allergic reactions).     dolutegravir-lamiVUDine (DOVATO) 50-300 MG tablet Take 1 tablet by mouth daily. (Katrina Wolfe taking differently: Take 1 tablet by mouth at bedtime.) 30 tablet 11   Dulaglutide (TRULICITY) 4.5 VF/6.4PP SOPN Inject 4.5 mg into the skin every Friday.     FARXIGA 10 MG TABS tablet Take 10 mg by mouth in the morning.     ferrous sulfate 325 (65 FE) MG EC tablet Take 325 mg by mouth daily with breakfast.     gabapentin (NEURONTIN) 100 MG capsule Take 100 mg by mouth 4 (four) times daily.     glimepiride (AMARYL) 2 MG tablet Take 2 mg by mouth 3 (three) times daily with meals.     HUMALOG KWIKPEN 100 UNIT/ML KwikPen Inject 10-15 Units into the skin daily with lunch.     HUMALOG MIX 75/25 KWIKPEN (75-25) 100 UNIT/ML Kwikpen Inject 60 Units into the skin 2 (two) times daily.     lisinopril-hydrochlorothiazide (ZESTORETIC) 20-25 MG tablet Take 1 tablet by mouth daily.     norethindrone (AYGESTIN) 5 MG tablet Take 5 mg by mouth in the morning.     rosuvastatin (CRESTOR) 5 MG tablet Take 5 mg by mouth every morning.     traMADol (ULTRAM) 50 MG tablet Take 50 mg by mouth every 4 (four) hours as needed for moderate pain.     COMFORT EZ PEN NEEDLES 31G X 8 MM MISC USE WITH insulin THREE TIMES DAILY AS DIRECTED     Insulin Syringe-Needle U-100 (INSULIN SYRINGE 1CC/31GX5/16") 31G X 5/16" 1 ML MISC 1 Units by Does not apply route 2 (two) times daily. 30 each 0   Lancets (ONETOUCH ULTRASOFT)  lancets      ONE TOUCH ULTRA TEST test strip       Results for orders placed or performed during the hospital encounter of 08/04/22 (from the past 48 hour(s))  Glucose, capillary     Status: Katrina   Collection Time: 08/04/22  6:35 AM  Result Value Ref Range   Glucose-Capillary 173 (H) 70 - 99 mg/dL    Comment: Glucose reference range applies only to samples taken after fasting for at least 8 hours.   No results found.  Review of Systems  Blood  pressure 129/88, pulse 82, temperature 98.1 F (36.7 C), temperature source Oral, resp. rate 18, height 5' 9.5" (1.765 m), weight 115.2 kg, SpO2 97 %. Physical Exam  Katrina Wolfe is alert, oriented, no adenopathy, well-dressed, normal affect, normal respiratory effort. Examination the skin is intact there is no ischemic changes she has a good dorsalis pedis pulse the failed hardware is prominent anteriorly. Assessment/Plan 1. H/O ankle fusion   2. Pain from implanted hardware, subsequent encounter       Plan: We will plan for hardware removal.  Risk and benefits were discussed including infection neurovascular injury persistent pain need for additional surgery.  Katrina Wolfe states she understands wished to proceed at this time.  Newt Minion, MD 08/04/2022, 6:41 AM

## 2022-08-04 NOTE — Anesthesia Preprocedure Evaluation (Addendum)
Anesthesia Evaluation  Patient identified by MRN, date of birth, ID band Patient awake    Reviewed: Allergy & Precautions, NPO status , Patient's Chart, lab work & pertinent test results  Airway Mallampati: II  TM Distance: >3 FB Neck ROM: Full    Dental  (+) Poor Dentition, Missing, Chipped, Dental Advisory Given,    Pulmonary asthma , pneumonia, resolved,    Pulmonary exam normal breath sounds clear to auscultation       Cardiovascular hypertension, Pt. on medications + Peripheral Vascular Disease  Normal cardiovascular exam Rhythm:Regular Rate:Normal     Neuro/Psych negative neurological ROS  negative psych ROS   GI/Hepatic Neg liver ROS, GERD  Medicated,  Endo/Other  diabetes, Poorly Controlled, Type 2, Oral Hypoglycemic Agents, Insulin DependentHyperlipidemia Obesity Last took Trulicity last Friday  Renal/GU Renal InsufficiencyRenal disease  negative genitourinary   Musculoskeletal  (+) Arthritis , Osteoarthritis,  Charcot's joint left ankle Painful hardware left foot   Abdominal (+) + obese,   Peds  Hematology negative hematology ROS (+) HIV,   Anesthesia Other Findings   Reproductive/Obstetrics                            Anesthesia Physical Anesthesia Plan  ASA: 3  Anesthesia Plan: General   Post-op Pain Management:    Induction: Intravenous  PONV Risk Score and Plan: 4 or greater and Treatment may vary due to age or medical condition, Ondansetron and Midazolam  Airway Management Planned: LMA  Additional Equipment: None  Intra-op Plan:   Post-operative Plan: Extubation in OR  Informed Consent: I have reviewed the patients History and Physical, chart, labs and discussed the procedure including the risks, benefits and alternatives for the proposed anesthesia with the patient or authorized representative who has indicated his/her understanding and acceptance.      Dental advisory given  Plan Discussed with: CRNA and Anesthesiologist  Anesthesia Plan Comments:         Anesthesia Quick Evaluation

## 2022-08-04 NOTE — Op Note (Signed)
08/04/2022  9:27 AM  PATIENT:  Katrina Wolfe    PRE-OPERATIVE DIAGNOSIS:  Failed Hardware Left Ankle  POST-OPERATIVE DIAGNOSIS:  Same  PROCEDURE:  REMOVAL DEEP HARDWARE LEFT ANKLE  SURGEON:  Newt Minion, MD  PHYSICIAN ASSISTANT:None ANESTHESIA:   General  PREOPERATIVE INDICATIONS:  Katrina Wolfe is a  56 y.o. female with a diagnosis of Failed Hardware Left Ankle who failed conservative measures and elected for surgical management.    The risks benefits and alternatives were discussed with the patient preoperatively including but not limited to the risks of infection, bleeding, nerve injury, cardiopulmonary complications, the need for revision surgery, among others, and the patient was willing to proceed.  OPERATIVE IMPLANTS: Removal anterior tibial plate and associated screws  '@ENCIMAGES'$ @  OPERATIVE FINDINGS: No signs of infection fusion site stable.  OPERATIVE PROCEDURE: Patient was brought the operating room underwent general anesthetic.  After adequate levels anesthesia were obtained patient's left lower extremity was prepped using DuraPrep draped into a sterile field a timeout was called.  The previous dorsal incision was used this was carried down through the interval with the anterior tibial tendon.  The tendon was protected.  The dissection was carried down to the hardware and using a periosteal elevator these fibrinous tissue was elevated off the hardware the screws were removed without complication the plate was removed.  There is no signs of deep infection.  The wound was irrigated with normal saline incision closed using 2-0 nylon sterile dressing was applied patient was taken the PACU in stable condition.   DISCHARGE PLANNING:  Antibiotic duration: Preoperative antibiotics  Weightbearing: Weightbearing as tolerated in the cam walker  Pain medication: Prescription for Percocet  Dressing care/ Wound VAC: Dry dressing  Ambulatory devices: Walker  Discharge to:  Home.  Follow-up: In the office 1 week post operative.

## 2022-08-04 NOTE — Anesthesia Procedure Notes (Signed)
Procedure Name: LMA Insertion Date/Time: 08/04/2022 8:45 AM  Performed by: Mariea Clonts, CRNAPre-anesthesia Checklist: Patient identified, Emergency Drugs available, Suction available and Patient being monitored Patient Re-evaluated:Patient Re-evaluated prior to induction Oxygen Delivery Method: Circle System Utilized Preoxygenation: Pre-oxygenation with 100% oxygen Induction Type: IV induction Ventilation: Mask ventilation without difficulty LMA: LMA inserted LMA Size: 5.0 Number of attempts: 1 Airway Equipment and Method: Bite block Placement Confirmation: positive ETCO2 Tube secured with: Tape Dental Injury: Teeth and Oropharynx as per pre-operative assessment

## 2022-08-04 NOTE — Transfer of Care (Signed)
Immediate Anesthesia Transfer of Care Note  Patient: Katrina Wolfe  Procedure(s) Performed: REMOVAL DEEP HARDWARE LEFT ANKLE (Left: Ankle)  Patient Location: PACU  Anesthesia Type:General  Level of Consciousness: oriented  Airway & Oxygen Therapy: Patient Spontanous Breathing and Patient connected to nasal cannula oxygen  Post-op Assessment: Report given to RN and Post -op Vital signs reviewed and stable  Post vital signs: Reviewed and stable  Last Vitals:  Vitals Value Taken Time  BP 130/79 08/04/22 0945  Temp 37 C 08/04/22 0939  Pulse 77 08/04/22 0950  Resp 19 08/04/22 0950  SpO2 95 % 08/04/22 0950  Vitals shown include unvalidated device data.  Last Pain:  Vitals:   08/04/22 0939  TempSrc:   PainSc: 0-No pain         Complications: No notable events documented.

## 2022-08-07 ENCOUNTER — Encounter (HOSPITAL_COMMUNITY): Payer: Self-pay | Admitting: Orthopedic Surgery

## 2022-08-07 ENCOUNTER — Other Ambulatory Visit: Payer: Self-pay | Admitting: Orthopedic Surgery

## 2022-08-07 ENCOUNTER — Telehealth: Payer: Self-pay | Admitting: Orthopedic Surgery

## 2022-08-07 MED ORDER — HYDROCODONE-ACETAMINOPHEN 5-325 MG PO TABS: 1.0000 | ORAL_TABLET | Freq: Four times a day (QID) | ORAL | 0 refills | Status: DC | PRN

## 2022-08-07 NOTE — Telephone Encounter (Signed)
Pt informed

## 2022-08-07 NOTE — Telephone Encounter (Signed)
Patient called in stating she had surgery on 08/18 and Dr. Sharol Given prescribed her Oxycodone but every time she takes it she throws up patient is requesting different medication, please advise

## 2022-08-09 ENCOUNTER — Telehealth: Payer: Self-pay | Admitting: *Deleted

## 2022-08-09 NOTE — Patient Outreach (Signed)
  Care Coordination   08/09/2022 Name: Katrina Wolfe MRN: [id number] DOB: [birth year]   Care Coordination Outreach Attempts:  An unsuccessful telephone outreach was attempted today to offer the patient information about available care coordination services as a benefit of their health plan.   Follow Up Plan:  Additional outreach attempts will be made to offer the patient care coordination information and services.   Encounter Outcome:  No Answer  Care Coordination Interventions Activated:  y    Care Coordination Interventions:  No, not indicated    [facility] [phone]

## 2022-08-14 ENCOUNTER — Encounter: Payer: Self-pay | Admitting: Orthopedic Surgery

## 2022-08-14 ENCOUNTER — Ambulatory Visit (INDEPENDENT_AMBULATORY_CARE_PROVIDER_SITE_OTHER): Payer: Medicare Other | Admitting: Orthopedic Surgery

## 2022-08-14 DIAGNOSIS — Z981 Arthrodesis status: Secondary | ICD-10-CM

## 2022-08-14 DIAGNOSIS — T85848D Pain due to other internal prosthetic devices, implants and grafts, subsequent encounter: Secondary | ICD-10-CM

## 2022-08-14 NOTE — Progress Notes (Signed)
Office Visit Note   Patient: Katrina Wolfe           Date of Birth: 1966/08/05           MRN: [id number] Visit Date: 08/14/2022              Requested by: Wenda Low, MD [address] [place],  [place] [address] PCP: Wenda Low, MD  Chief Complaint  Patient presents with   Left Ankle - Routine Post Op    08/04/22 removal of deep hardware      HPI: Patient is a 56 year old woman who presents 1 week status post removal of failed deep retained hardware for left ankle fusion.  Patient has no complaints.  Assessment & Plan: Visit Diagnoses:  1. H/O ankle fusion   2. Pain from implanted hardware, subsequent encounter     Plan: Patient will start Dial soap cleansing dry dressing changes she was given dressing supplies.  She will continue with the fracture boot.  At follow-up remove sutures.  Follow-Up Instructions: Return in about 2 weeks (around 08/28/2022).   Ortho Exam  Patient is alert, oriented, no adenopathy, well-dressed, normal affect, normal respiratory effort. Examination the incision is well approximated there is some clear drainage she has a good dorsalis pedis pulse.  Dry dressing is applied.  Imaging: No results found. No images are attached to the encounter.  Labs: Lab Results  Component Value Date   HGBA1C 8.2 (H) 11/30/2021   HGBA1C 8.8 (H) 06/24/2021   ESRSEDRATE 53 (H) 08/15/2021   ESRSEDRATE 74 (H) 06/30/2021   CRP 18.5 (H) 08/15/2021   CRP 0.9 06/30/2021   REPTSTATUS 07/03/2021 FINAL 06/27/2021   GRAMSTAIN  06/27/2021    RARE WBC PRESENT,BOTH PMN AND MONONUCLEAR NO ORGANISMS SEEN    CULT  06/27/2021    RARE CORYNEBACTERIUM SPECIES Standardized susceptibility testing for this organism is not available. NO ANAEROBES ISOLATED Performed at [facility], [place] [address] [place], [place] [address]    LABORGA PSEUDOMONAS AERUGINOSA 04/04/2019   LABORGA SERRATIA MARCESCENS 04/04/2019   LABORGA STAPHYLOCOCCUS AUREUS  04/04/2019     Lab Results  Component Value Date   ALBUMIN 3.5 08/04/2022   ALBUMIN 2.8 (L) 07/02/2021   ALBUMIN 2.8 (L) 07/01/2021    Lab Results  Component Value Date   MG 1.6 (L) 07/04/2021   MG 1.5 (L) 07/03/2021   MG 1.6 (L) 07/02/2021   No results found for: "VD25OH"  No results found for: "PREALBUMIN"    Latest Ref Rng & Units 08/04/2022    7:35 AM 02/09/2022    2:25 PM 11/28/2021   10:54 AM  CBC EXTENDED  WBC 4.0 - 10.5 K/uL 10.6  12.1  11.9   RBC 3.87 - 5.11 MIL/uL 4.87  4.92  5.27   Hemoglobin 12.0 - 15.0 g/dL 16.1  15.1  15.8   HCT 36.0 - 46.0 % 48.8  44.7  48.6   Platelets 150 - 400 K/uL 370  417  422   NEUT# 1,500 - 7,800 cells/uL  7,780    Lymph# 850 - 3,900 cells/uL  3,025       There is no height or weight on file to calculate BMI.  Orders:  No orders of the defined types were placed in this encounter.  No orders of the defined types were placed in this encounter.    Procedures: No procedures performed  Clinical Data: No additional findings.  ROS:  All other systems negative, except as  noted in the HPI. Review of Systems  Objective: Vital Signs: There were no vitals taken for this visit.  Specialty Comments:  No specialty comments available.  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, with necrosis of bone ([place])    Diabetes mellitus type 2, uncontrolled, with complications 03/00/9233   Diabetic nephropathy ([place]) 06/26/2021   CKD (chronic kidney disease), stage III ([place]) 06/26/2021   HTN (hypertension) 06/24/2021   HIV (human immunodeficiency virus infection) ([place]) 06/24/2021   DM2 (diabetes mellitus, type 2) ([place]) 06/24/2021   HLD (hyperlipidemia) 06/24/2021   Osteomyelitis of left foot ([place]) 06/24/2021   Elevated LFTs 06/24/2021   Acute-on-chronic  kidney injury ([place]) 06/24/2021   Osteomyelitis of foot ([place]) 00/76/2263   Hardware complicating wound infection ([place])    Subacute osteomyelitis, left ankle and foot ([place])    Open displaced fracture of fifth metatarsal bone of left foot    Healthcare maintenance 04/03/2019   HIV disease ([place]) 10/27/2015   DM type 2 (diabetes mellitus, type 2) ([place]) 10/27/2015   Hyperlipidemia 10/27/2015   Essential hypertension 10/27/2015   Severe vulvar dysplasia, histologically confirmed 10/15/2015   Past Medical History:  Diagnosis Date   Abnormal uterine bleeding ([place])    Arthritis    knees, fingers   Asthma    as child, no problem since 1995   CKD (chronic kidney disease), stage II    Congenital cardiomegaly    per pt has always been told since childhood and siblings    Diabetes mellitus without complication ([place])    Elevated liver function tests    GERD (gastroesophageal reflux disease)    History of genital warts    HIV (human immunodeficiency virus infection) ([place])    DX 1998--  MONITORED BY INFECTIOUS DISEASE-- DR Carlyle Basques   Hypercholesterolemia    Hypertension    Intermittent palpitations    mild-- no meds   Legally blind in left eye, as defined in [place]    trauma as child   Peripheral vascular disease ([place])    Pneumonia    Renal disorder    Tuberculosis    ? 2005 or 2007   Type 2 diabetes mellitus ([place])    Type II    Uterine fibroid    VIN III (vulvar intraepithelial neoplasia III)    and Verrucoid lesion on the mons   Wears glasses     Family History  Problem Relation Age of Onset   Hypertension Mother    Diabetes Father    Cancer Brother    Breast cancer Cousin     Past Surgical History:  Procedure Laterality Date   CESAREAN SECTION  2001  &  3354   CO2 LASER APPLICATION N/A 56/25/6389   Procedure: CO2 LASER APPLICATION AND WIDE VOCAL EXCSION OF LESION ON MONS PUBIS;  Surgeon: Marti Sleigh, MD;  Location: [place];  Service:  Gynecology;  Laterality: N/A;   CASE CANCELLED    CO2 LASER APPLICATION N/A 37/34/2876   Procedure: CO2 LASER OF VULVAR;  Surgeon: Marti Sleigh, MD;  Location: [facility];  Service: Gynecology;  Laterality: N/A;   CORNEAL TRANSPLANT Left 12/18/2004   failed   D & C HYSTEROSCOPY /  RESECTION FIBROID  12/18/2002   DILATATION & CURETTAGE/HYSTEROSCOPY WITH MYOSURE N/A 10/01/2019   Procedure:  DILATATION & CURETTAGE/HYSTEROSCOPY WITH MYOSURE and HYDROTHERMAL ABLATION;  Surgeon: Thurnell Lose, MD;  Location: [place];  Service: Gynecology;  Laterality: N/A;  HTA rep will be here. Confirmed on 09/25/19 CS   EYE SURGERY     FOOT ARTHRODESIS Left 11/30/2021   Procedure: LEFT TIBIOCALCANEAL FUSION;  Surgeon: Newt Minion, MD;  Location: [place];  Service: Orthopedics;  Laterality: Left;   HARDWARE REMOVAL Left 04/04/2019   Procedure: EXCISION BONE AND REMOVE DEEP HARDWARE LEFT 5TH METATARSAL;  Surgeon: Newt Minion, MD;  Location: [place];  Service: Orthopedics;  Laterality: Left;   HARDWARE REMOVAL Left 08/04/2022   Procedure: REMOVAL DEEP HARDWARE LEFT ANKLE;  Surgeon: Newt Minion, MD;  Location: [place];  Service: Orthopedics;  Laterality: Left;   I & D EXTREMITY Left 06/27/2021   Procedure: IRRIGATION AND DEBRIDEMENT FOOT ULCER;  Surgeon: Jessy Oto, MD;  Location: [place];  Service: Orthopedics;  Laterality: Left;   KNEE ARTHROSCOPY W/ ACL RECONSTRUCTION Bilateral 12/18/2006   ORIF TOE FRACTURE Left 08/02/2017   Procedure: OPEN REDUCTION INTERNAL FIXATION (ORIF) FIFTH METATARSAL (TOE) BASE FRACTURE;  Surgeon: Wylene Simmer, MD;  Location: [place];  Service: Orthopedics;  Laterality: Left;   VULVECTOMY N/A 01/07/2016   Procedure: WIDE LOCAL EXCISION;  Surgeon: Marti Sleigh, MD;  Location: [facility];  Service: Gynecology;  Laterality: N/A;  mons pubis as site   Social History   Occupational History   Not on  file  Tobacco Use   Smoking status: Never   Smokeless tobacco: Never  Vaping Use   Vaping Use: Never used  Substance and Sexual Activity   Alcohol use: No    Alcohol/week: 0.0 standard drinks of alcohol   Drug use: No   Sexual activity: Yes    Partners: Male    Birth control/protection: None, Condom    Comment: declined condoms 03/07/21

## 2022-08-16 DIAGNOSIS — E7800 Pure hypercholesterolemia, unspecified: Secondary | ICD-10-CM | POA: Diagnosis not present

## 2022-08-16 DIAGNOSIS — I10 Essential (primary) hypertension: Secondary | ICD-10-CM | POA: Diagnosis not present

## 2022-08-16 DIAGNOSIS — E1142 Type 2 diabetes mellitus with diabetic polyneuropathy: Secondary | ICD-10-CM | POA: Diagnosis not present

## 2022-08-16 DIAGNOSIS — N1831 Chronic kidney disease, stage 3a: Secondary | ICD-10-CM | POA: Diagnosis not present

## 2022-08-29 DIAGNOSIS — E119 Type 2 diabetes mellitus without complications: Secondary | ICD-10-CM | POA: Diagnosis not present

## 2022-08-31 ENCOUNTER — Encounter: Payer: Self-pay | Admitting: Orthopedic Surgery

## 2022-08-31 ENCOUNTER — Ambulatory Visit (INDEPENDENT_AMBULATORY_CARE_PROVIDER_SITE_OTHER): Payer: Medicare Other | Admitting: Orthopedic Surgery

## 2022-08-31 DIAGNOSIS — T85848D Pain due to other internal prosthetic devices, implants and grafts, subsequent encounter: Secondary | ICD-10-CM

## 2022-08-31 DIAGNOSIS — Z981 Arthrodesis status: Secondary | ICD-10-CM

## 2022-08-31 NOTE — Progress Notes (Signed)
Office Visit Note   Patient: Katrina Wolfe           Date of Birth: January 25, 1966           MRN: [id number] Visit Date: 08/31/2022              Requested by: Wenda Low, MD [address] [place],  [place] [address] PCP: Wenda Low, MD  Chief Complaint  Patient presents with   Left Ankle - Routine Post Op    08/04/2022 removal deep HDW left ankle      HPI: Patient is a 56 year old woman who is 3 weeks status post removal of deep retained hardware left ankle.  Assessment & Plan: Visit Diagnoses:  1. Pain from implanted hardware, subsequent encounter   2. H/O ankle fusion     Plan: Sutures are harvested continue with the fracture boot.  Three-view radiographs of the left ankle at follow-up anticipate advancing to regular shoewear.  Follow-Up Instructions: Return in about 4 weeks (around 09/28/2022).   Ortho Exam  Patient is alert, oriented, no adenopathy, well-dressed, normal affect, normal respiratory effort. Examination incision is well-healed there is no redness no swelling no drainage.  Sutures are harvested.  Imaging: No results found. No images are attached to the encounter.  Labs: Lab Results  Component Value Date   HGBA1C 8.2 (H) 11/30/2021   HGBA1C 8.8 (H) 06/24/2021   ESRSEDRATE 53 (H) 08/15/2021   ESRSEDRATE 74 (H) 06/30/2021   CRP 18.5 (H) 08/15/2021   CRP 0.9 06/30/2021   REPTSTATUS 07/03/2021 FINAL 06/27/2021   GRAMSTAIN  06/27/2021    RARE WBC PRESENT,BOTH PMN AND MONONUCLEAR NO ORGANISMS SEEN    CULT  06/27/2021    RARE CORYNEBACTERIUM SPECIES Standardized susceptibility testing for this organism is not available. NO ANAEROBES ISOLATED Performed at [facility], [place] [address] [place], [place] [address]    LABORGA PSEUDOMONAS AERUGINOSA 04/04/2019   LABORGA SERRATIA MARCESCENS 04/04/2019   LABORGA STAPHYLOCOCCUS AUREUS 04/04/2019     Lab Results  Component Value Date   ALBUMIN 3.5 08/04/2022   ALBUMIN 2.8 (L)  07/02/2021   ALBUMIN 2.8 (L) 07/01/2021    Lab Results  Component Value Date   MG 1.6 (L) 07/04/2021   MG 1.5 (L) 07/03/2021   MG 1.6 (L) 07/02/2021   No results found for: "VD25OH"  No results found for: "PREALBUMIN"    Latest Ref Rng & Units 08/04/2022    7:35 AM 02/09/2022    2:25 PM 11/28/2021   10:54 AM  CBC EXTENDED  WBC 4.0 - 10.5 K/uL 10.6  12.1  11.9   RBC 3.87 - 5.11 MIL/uL 4.87  4.92  5.27   Hemoglobin 12.0 - 15.0 g/dL 16.1  15.1  15.8   HCT 36.0 - 46.0 % 48.8  44.7  48.6   Platelets 150 - 400 K/uL 370  417  422   NEUT# 1,500 - 7,800 cells/uL  7,780    Lymph# 850 - 3,900 cells/uL  3,025       There is no height or weight on file to calculate BMI.  Orders:  No orders of the defined types were placed in this encounter.  No orders of the defined types were placed in this encounter.    Procedures: No procedures performed  Clinical Data: No additional findings.  ROS:  All other systems negative, except as noted in the HPI. Review of Systems  Objective: Vital Signs: There were no vitals taken for this visit.  Specialty Comments:  No specialty comments available.  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, with necrosis of bone ([place])    Diabetes mellitus type 2, uncontrolled, with complications 40/98/1191   Diabetic nephropathy ([place]) 06/26/2021   CKD (chronic kidney disease), stage III ([place]) 06/26/2021   HTN (hypertension) 06/24/2021   HIV (human immunodeficiency virus infection) ([place]) 06/24/2021   DM2 (diabetes mellitus, type 2) ([place]) 06/24/2021   HLD (hyperlipidemia) 06/24/2021   Osteomyelitis of left foot ([place]) 06/24/2021   Elevated LFTs 06/24/2021   Acute-on-chronic kidney injury ([place]) 06/24/2021   Osteomyelitis of foot ([place]) 47/82/9562   Hardware complicating  wound infection ([place])    Subacute osteomyelitis, left ankle and foot ([place])    Open displaced fracture of fifth metatarsal bone of left foot    Healthcare maintenance 04/03/2019   HIV disease ([place]) 10/27/2015   DM type 2 (diabetes mellitus, type 2) ([place]) 10/27/2015   Hyperlipidemia 10/27/2015   Essential hypertension 10/27/2015   Severe vulvar dysplasia, histologically confirmed 10/15/2015   Past Medical History:  Diagnosis Date   Abnormal uterine bleeding (AUB)    Arthritis    knees, fingers   Asthma    as child, no problem since 1995   CKD (chronic kidney disease), stage II    Congenital cardiomegaly    per pt has always been told since childhood and siblings    Diabetes mellitus without complication ([place])    Elevated liver function tests    GERD (gastroesophageal reflux disease)    History of genital warts    HIV (human immunodeficiency virus infection) ([place])    DX 1998--  MONITORED BY INFECTIOUS DISEASE-- DR Carlyle Basques   Hypercholesterolemia    Hypertension    Intermittent palpitations    mild-- no meds   Legally blind in left eye, as defined in [place]    trauma as child   Peripheral vascular disease ([place])    Pneumonia    Renal disorder    Tuberculosis    ? 2005 or 2007   Type 2 diabetes mellitus ([place])    Type II    Uterine fibroid    VIN III (vulvar intraepithelial neoplasia III)    and Verrucoid lesion on the mons   Wears glasses     Family History  Problem Relation Age of Onset   Hypertension Mother    Diabetes Father    Cancer Brother    Breast cancer Cousin     Past Surgical History:  Procedure Laterality Date   CESAREAN SECTION  2001  &  1308   CO2 LASER APPLICATION N/A 65/78/4696   Procedure: CO2 LASER APPLICATION AND WIDE VOCAL EXCSION OF LESION ON MONS PUBIS;  Surgeon: Marti Sleigh, MD;  Location: [facility];  Service: Gynecology;  Laterality: N/A;   CASE CANCELLED    CO2 LASER APPLICATION N/A 29/52/8413   Procedure: CO2  LASER OF VULVAR;  Surgeon: Marti Sleigh, MD;  [facility];  Service: Gynecology;  Laterality: N/A;   CORNEAL TRANSPLANT Left 12/18/2004   failed   D & C HYSTEROSCOPY /  RESECTION FIBROID  12/18/2002   DILATATION & CURETTAGE/HYSTEROSCOPY WITH MYOSURE N/A 10/01/2019   Procedure: DILATATION & CURETTAGE/HYSTEROSCOPY WITH MYOSURE and HYDROTHERMAL ABLATION;  Surgeon: Thurnell Lose, MD;  Location: [facility];  Service: Gynecology;  Laterality: N/A;  HTA rep will be here. Confirmed on 09/25/19 CS   EYE SURGERY     FOOT ARTHRODESIS Left 11/30/2021   Procedure: LEFT TIBIOCALCANEAL FUSION;  Surgeon: Newt Minion, MD;  Location: [facility];  Service: Orthopedics;  Laterality: Left;   HARDWARE REMOVAL Left 04/04/2019   Procedure: EXCISION BONE AND REMOVE DEEP HARDWARE LEFT 5TH METATARSAL;  Surgeon: Newt Minion, MD;  Location: [facility];  Service: Orthopedics;  Laterality: Left;   HARDWARE REMOVAL Left 08/04/2022   Procedure: REMOVAL DEEP HARDWARE LEFT ANKLE;  Surgeon: Newt Minion, MD;  Location: [facility];  Service: Orthopedics;  Laterality: Left;   I & D EXTREMITY Left 06/27/2021   Procedure: IRRIGATION AND DEBRIDEMENT FOOT ULCER;  Surgeon: Jessy Oto, MD;  Location: [facility];  Service: Orthopedics;  Laterality: Left;   KNEE ARTHROSCOPY W/ ACL RECONSTRUCTION Bilateral 12/18/2006   ORIF TOE FRACTURE Left 08/02/2017   Procedure: OPEN REDUCTION INTERNAL FIXATION (ORIF) FIFTH METATARSAL (TOE) BASE FRACTURE;  Surgeon: Wylene Simmer, MD;  Location: [facility];  Service: Orthopedics;  Laterality: Left;   VULVECTOMY N/A 01/07/2016   Procedure: WIDE LOCAL EXCISION;  Surgeon: Marti Sleigh, MD;  Location: [facility];  Service: Gynecology;  Laterality: N/A;  mons pubis as site   Social History   Occupational History   Not on file  Tobacco Use   Smoking status: Never   Smokeless tobacco: Never  Vaping Use   Vaping Use: Never  used  Substance and Sexual Activity   Alcohol use: No    Alcohol/week: 0.0 standard drinks of alcohol   Drug use: No   Sexual activity: Yes    Partners: Male    Birth control/protection: None, Condom    Comment: declined condoms 03/07/21

## 2022-09-11 DIAGNOSIS — H5213 Myopia, bilateral: Secondary | ICD-10-CM | POA: Diagnosis not present

## 2022-09-15 DIAGNOSIS — E1142 Type 2 diabetes mellitus with diabetic polyneuropathy: Secondary | ICD-10-CM | POA: Diagnosis not present

## 2022-09-15 DIAGNOSIS — N1831 Chronic kidney disease, stage 3a: Secondary | ICD-10-CM | POA: Diagnosis not present

## 2022-09-15 DIAGNOSIS — I10 Essential (primary) hypertension: Secondary | ICD-10-CM | POA: Diagnosis not present

## 2022-09-15 DIAGNOSIS — E7800 Pure hypercholesterolemia, unspecified: Secondary | ICD-10-CM | POA: Diagnosis not present

## 2022-09-19 ENCOUNTER — Ambulatory Visit: Payer: Medicare Other | Admitting: Internal Medicine

## 2022-09-28 ENCOUNTER — Ambulatory Visit: Payer: Medicare Other | Admitting: Orthopedic Surgery

## 2022-10-02 DIAGNOSIS — N1831 Chronic kidney disease, stage 3a: Secondary | ICD-10-CM | POA: Diagnosis not present

## 2022-10-02 DIAGNOSIS — E1122 Type 2 diabetes mellitus with diabetic chronic kidney disease: Secondary | ICD-10-CM | POA: Diagnosis not present

## 2022-10-02 DIAGNOSIS — I129 Hypertensive chronic kidney disease with stage 1 through stage 4 chronic kidney disease, or unspecified chronic kidney disease: Secondary | ICD-10-CM | POA: Diagnosis not present

## 2022-10-03 DIAGNOSIS — H5203 Hypermetropia, bilateral: Secondary | ICD-10-CM | POA: Diagnosis not present

## 2022-10-05 ENCOUNTER — Ambulatory Visit (INDEPENDENT_AMBULATORY_CARE_PROVIDER_SITE_OTHER): Payer: Medicare Other

## 2022-10-05 ENCOUNTER — Ambulatory Visit (INDEPENDENT_AMBULATORY_CARE_PROVIDER_SITE_OTHER): Payer: Medicare Other | Admitting: Orthopedic Surgery

## 2022-10-05 ENCOUNTER — Encounter: Payer: Self-pay | Admitting: Orthopedic Surgery

## 2022-10-05 DIAGNOSIS — Z981 Arthrodesis status: Secondary | ICD-10-CM

## 2022-10-05 NOTE — Progress Notes (Signed)
Office Visit Note   Patient: Katrina Wolfe           Date of Birth: 08/12/1966           MRN: [id number] Visit Date: 10/05/2022              Requested by: Wenda Low, MD [address] [place], [place],  [place] [address] PCP: Wenda Low, MD  Chief Complaint  Patient presents with   Left Ankle - Routine Post Op    08/04/2022 removal deep HDW left ankle       HPI: Patient is a 56 year old woman who is 2 months status post removal of deep retained hardware tibial calcaneal fusion.  Patient denies any pains or symptoms.  States she has been able to occasionally ambulate without the fracture boot on.  Assessment & Plan: Visit Diagnoses:  1. H/O ankle fusion     Plan: Patient is provided a prescription for [employer] for a double upright brace extra-depth shoe and orthotics.  We will transition from the boot to the shoe when the braces are fabricated.  Follow-Up Instructions: Return in about 4 weeks (around 11/02/2022).   Ortho Exam  Patient is alert, oriented, no adenopathy, well-dressed, normal affect, normal respiratory effort. Examination incision is well-healed there is no redness no cellulitis no signs of infection.  Patient's foot is plantigrade.  Imaging: XR Ankle Complete Left  Result Date: 10/05/2022 Three-view radiographs of the left ankle shows removal of previous deep retained hardware the tibial calcaneal screw is in place with lucency with no screw failure.  No change in the alignment.  No images are attached to the encounter.  Labs: Lab Results  Component Value Date   HGBA1C 8.2 (H) 11/30/2021   HGBA1C 8.8 (H) 06/24/2021   ESRSEDRATE 53 (H) 08/15/2021   ESRSEDRATE 74 (H) 06/30/2021   CRP 18.5 (H) 08/15/2021   CRP 0.9 06/30/2021   REPTSTATUS 07/03/2021 FINAL 06/27/2021   GRAMSTAIN  06/27/2021    RARE WBC PRESENT,BOTH PMN AND MONONUCLEAR NO ORGANISMS SEEN    CULT  06/27/2021    RARE CORYNEBACTERIUM SPECIES Standardized susceptibility testing  for this organism is not available. NO ANAEROBES ISOLATED Performed at [facility], [place] [address], [place], [place] [address]    LABORGA PSEUDOMONAS AERUGINOSA 04/04/2019   LABORGA SERRATIA MARCESCENS 04/04/2019   LABORGA STAPHYLOCOCCUS AUREUS 04/04/2019     Lab Results  Component Value Date   ALBUMIN 3.5 08/04/2022   ALBUMIN 2.8 (L) 07/02/2021   ALBUMIN 2.8 (L) 07/01/2021    Lab Results  Component Value Date   MG 1.6 (L) 07/04/2021   MG 1.5 (L) 07/03/2021   MG 1.6 (L) 07/02/2021   No results found for: "VD25OH"  No results found for: "PREALBUMIN"    Latest Ref Rng & Units 08/04/2022    7:35 AM 02/09/2022    2:25 PM 11/28/2021   10:54 AM  CBC EXTENDED  WBC 4.0 - 10.5 K/uL 10.6  12.1  11.9   RBC 3.87 - 5.11 MIL/uL 4.87  4.92  5.27   Hemoglobin 12.0 - 15.0 g/dL 16.1  15.1  15.8   HCT 36.0 - 46.0 % 48.8  44.7  48.6   Platelets 150 - 400 K/uL 370  417  422   NEUT# 1,500 - 7,800 cells/uL  7,780    Lymph# 850 - 3,900 cells/uL  3,025       There is no height or weight on file to calculate BMI.  Orders:  Orders Placed This Encounter  Procedures   XR Ankle Complete Left   No orders of the defined types were placed in this encounter.    Procedures: No procedures performed  Clinical Data: No additional findings.  ROS:  All other systems negative, except as noted in the HPI. Review of Systems  Objective: Vital Signs: There were no vitals taken for this visit.  Specialty Comments:  No specialty comments available.  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, with necrosis of bone ([place])    Diabetes mellitus type 2, uncontrolled, with complications 71/69/6789   Diabetic nephropathy ([place]) 06/26/2021   CKD (chronic kidney disease), stage III ([place]) 06/26/2021   HTN  (hypertension) 06/24/2021   HIV (human immunodeficiency virus infection) ([place]) 06/24/2021   DM2 (diabetes mellitus, type 2) ([place]) 06/24/2021   HLD (hyperlipidemia) 06/24/2021   Osteomyelitis of left foot ([place]) 06/24/2021   Elevated LFTs 06/24/2021   Acute-on-chronic kidney injury ([place]) 06/24/2021   Osteomyelitis of foot ([place]) 38/09/1750   Hardware complicating wound infection ([place])    Subacute osteomyelitis, left ankle and foot ([place])    Open displaced fracture of fifth metatarsal bone of left foot    Healthcare maintenance 04/03/2019   HIV disease ([place]) 10/27/2015   DM type 2 (diabetes mellitus, type 2) ([place]) 10/27/2015   Hyperlipidemia 10/27/2015   Essential hypertension 10/27/2015   Severe vulvar dysplasia, histologically confirmed 10/15/2015   Past Medical History:  Diagnosis Date   Abnormal uterine bleeding (AUB)    Arthritis    knees, fingers   Asthma    as child, no problem since 1995   CKD (chronic kidney disease), stage II    Congenital cardiomegaly    per pt has always been told since childhood and siblings    Diabetes mellitus without complication ([place])    Elevated liver function tests    GERD (gastroesophageal reflux disease)    History of genital warts    HIV (human immunodeficiency virus infection) ([place])    DX 1998--  MONITORED BY INFECTIOUS DISEASE-- DR Carlyle Basques   Hypercholesterolemia    Hypertension    Intermittent palpitations    mild-- no meds   Legally blind in left eye, as defined in [place]    trauma as child   Peripheral vascular disease ([place])    Pneumonia    Renal disorder    Tuberculosis    ? 2005 or 2007   Type 2 diabetes mellitus ([place])    Type II    Uterine fibroid    VIN III (vulvar intraepithelial neoplasia III)    and Verrucoid lesion on the mons   Wears glasses     Family History  Problem Relation Age of Onset   Hypertension Mother    Diabetes Father    Cancer Brother    Breast cancer Cousin     Past Surgical History:   Procedure Laterality Date   CESAREAN SECTION  2001  &  0258   CO2 LASER APPLICATION N/A 52/77/8242   Procedure: CO2 LASER APPLICATION AND WIDE VOCAL EXCSION OF LESION ON MONS PUBIS;  Surgeon: Marti Sleigh, MD;  Location: [facility];  Service: Gynecology;  Laterality: N/A;   CASE CANCELLED    CO2 LASER APPLICATION N/A 35/36/1443   Procedure: CO2 LASER OF VULVAR;  Surgeon: Quillian Quince  Clarke-Pearson, MD;  Location: [facility];  Service: Gynecology;  Laterality: N/A;   CORNEAL TRANSPLANT Left 12/18/2004   failed   D & C HYSTEROSCOPY /  RESECTION FIBROID  12/18/2002   DILATATION & CURETTAGE/HYSTEROSCOPY WITH MYOSURE N/A 10/01/2019   Procedure: DILATATION & CURETTAGE/HYSTEROSCOPY WITH MYOSURE and HYDROTHERMAL ABLATION;  Surgeon: Thurnell Lose, MD;  Location: [facility];  Service: Gynecology;  Laterality: N/A;  HTA rep will be here. Confirmed on 09/25/19 CS   EYE SURGERY     FOOT ARTHRODESIS Left 11/30/2021   Procedure: LEFT TIBIOCALCANEAL FUSION;  Surgeon: Newt Minion, MD;  Location: [facility];  Service: Orthopedics;  Laterality: Left;   HARDWARE REMOVAL Left 04/04/2019   Procedure: EXCISION BONE AND REMOVE DEEP HARDWARE LEFT 5TH METATARSAL;  Surgeon: Newt Minion, MD;  Location: [facility];  Service: Orthopedics;  Laterality: Left;   HARDWARE REMOVAL Left 08/04/2022   Procedure: REMOVAL DEEP HARDWARE LEFT ANKLE;  Surgeon: Newt Minion, MD;  Location: [facility];  Service: Orthopedics;  Laterality: Left;   I & D EXTREMITY Left 06/27/2021   Procedure: IRRIGATION AND DEBRIDEMENT FOOT ULCER;  Surgeon: Jessy Oto, MD;  Location: [facility];  Service: Orthopedics;  Laterality: Left;   KNEE ARTHROSCOPY W/ ACL RECONSTRUCTION Bilateral 12/18/2006   ORIF TOE FRACTURE Left 08/02/2017   Procedure: OPEN REDUCTION INTERNAL FIXATION (ORIF) FIFTH METATARSAL (TOE) BASE FRACTURE;  Surgeon: Wylene Simmer, MD;  Location: [facility];  Service: Orthopedics;   Laterality: Left;   VULVECTOMY N/A 01/07/2016   Procedure: WIDE LOCAL EXCISION;  Surgeon: Marti Sleigh, MD;  Location: [facility];  Service: Gynecology;  Laterality: N/A;  mons pubis as site   Social History   Occupational History   Not on file  Tobacco Use   Smoking status: Never   Smokeless tobacco: Never  Vaping Use   Vaping Use: Never used  Substance and Sexual Activity   Alcohol use: No    Alcohol/week: 0.0 standard drinks of alcohol   Drug use: No   Sexual activity: Yes    Partners: Male    Birth control/protection: None, Condom    Comment: declined condoms 03/07/21

## 2022-10-17 ENCOUNTER — Encounter: Payer: Self-pay | Admitting: Internal Medicine

## 2022-10-17 ENCOUNTER — Other Ambulatory Visit: Payer: Self-pay

## 2022-10-17 ENCOUNTER — Ambulatory Visit (INDEPENDENT_AMBULATORY_CARE_PROVIDER_SITE_OTHER): Payer: Medicare Other | Admitting: Internal Medicine

## 2022-10-17 VITALS — BP 102/63 | HR 82 | Temp 98.0°F | Ht 69.0 in

## 2022-10-17 DIAGNOSIS — N1831 Chronic kidney disease, stage 3a: Secondary | ICD-10-CM | POA: Diagnosis not present

## 2022-10-17 DIAGNOSIS — E1142 Type 2 diabetes mellitus with diabetic polyneuropathy: Secondary | ICD-10-CM | POA: Diagnosis not present

## 2022-10-17 DIAGNOSIS — B20 Human immunodeficiency virus [HIV] disease: Secondary | ICD-10-CM | POA: Diagnosis not present

## 2022-10-17 DIAGNOSIS — N184 Chronic kidney disease, stage 4 (severe): Secondary | ICD-10-CM | POA: Diagnosis not present

## 2022-10-17 DIAGNOSIS — Z79899 Other long term (current) drug therapy: Secondary | ICD-10-CM

## 2022-10-17 DIAGNOSIS — E7800 Pure hypercholesterolemia, unspecified: Secondary | ICD-10-CM | POA: Diagnosis not present

## 2022-10-17 DIAGNOSIS — I10 Essential (primary) hypertension: Secondary | ICD-10-CM | POA: Diagnosis not present

## 2022-10-18 LAB — T-HELPER CELL (CD4) - (RCID CLINIC ONLY)
CD4 % Helper T Cell: 30 % — ABNORMAL LOW (ref 33–65)
CD4 T Cell Abs: 575 /uL (ref 400–1790)

## 2022-10-18 NOTE — Progress Notes (Signed)
RFV: follow up for hiv disease  Patient ID: Katrina Wolfe, female   DOB: Sep 22, 1966, 56 y.o.   MRN: [id number]  HPI [name] is a 56yo F with hiv disease on dovato. Doing well with meds. Still wearing boot from recent foot/ankle surgery.slowly healing. Overall doing well.  Outpatient Encounter Medications as of 10/17/2022  Medication Sig   acetaminophen (TYLENOL) 500 MG tablet Take 1,000 mg by mouth every 6 (six) hours as needed for moderate pain.   amLODipine (NORVASC) 5 MG tablet Take 5 mg by mouth in the morning.   aspirin EC 81 MG tablet Take 81 mg by mouth daily. Swallow whole.   calcium carbonate (TUMS - DOSED IN MG ELEMENTAL CALCIUM) 500 MG chewable tablet Chew 1 tablet by mouth as needed for indigestion or heartburn.   Calcium Carbonate-Vitamin D 600-200 MG-UNIT TABS Take 1 tablet by mouth daily.   COMFORT EZ PEN NEEDLES 31G X 8 MM MISC USE WITH insulin THREE TIMES DAILY AS DIRECTED   diphenhydrAMINE (BENADRYL) 25 MG tablet Take 25 mg by mouth every 6 (six) hours as needed for allergies (or allergic reactions).   dolutegravir-lamiVUDine (DOVATO) 50-300 MG tablet Take 1 tablet by mouth daily. (Patient taking differently: Take 1 tablet by mouth at bedtime.)   Dulaglutide (TRULICITY) 4.5 IZ/1.2WP SOPN Inject 4.5 mg into the skin every Friday.   FARXIGA 10 MG TABS tablet Take 10 mg by mouth in the morning.   ferrous sulfate 325 (65 FE) MG EC tablet Take 325 mg by mouth daily with breakfast.   gabapentin (NEURONTIN) 100 MG capsule Take 100 mg by mouth 4 (four) times daily.   glimepiride (AMARYL) 2 MG tablet Take 2 mg by mouth 3 (three) times daily with meals.   HUMALOG KWIKPEN 100 UNIT/ML KwikPen Inject 10-15 Units into the skin daily with lunch.   HUMALOG MIX 75/25 KWIKPEN (75-25) 100 UNIT/ML Kwikpen Inject 60 Units into the skin 2 (two) times daily.   HYDROcodone-acetaminophen (NORCO/VICODIN) 5-325 MG tablet Take 1 tablet by mouth every 6 (six) hours as needed for moderate pain.    Insulin Syringe-Needle U-100 (INSULIN SYRINGE 1CC/31GX5/16") 31G X 5/16" 1 ML MISC 1 Units by Does not apply route 2 (two) times daily.   Lancets (ONETOUCH ULTRASOFT) lancets    lisinopril-hydrochlorothiazide (ZESTORETIC) 20-25 MG tablet Take 1 tablet by mouth daily.   norethindrone (AYGESTIN) 5 MG tablet Take 5 mg by mouth in the morning.   ONE TOUCH ULTRA TEST test strip    rosuvastatin (CRESTOR) 5 MG tablet Take 5 mg by mouth every morning.   traMADol (ULTRAM) 50 MG tablet Take 50 mg by mouth every 4 (four) hours as needed for moderate pain.   Continuous Blood Gluc Receiver (DEXCOM G6 RECEIVER) DEVI as directed 30 days   oxyCODONE-acetaminophen (PERCOCET/ROXICET) 5-325 MG tablet Take 1 tablet by mouth every 4 (four) hours as needed. (Patient not taking: Reported on 10/17/2022)   No facility-administered encounter medications on file as of 10/17/2022.     Patient Active Problem List   Diagnosis Date Noted   S/P ankle fusion 11/30/2021   Charcot's joint, left ankle and foot    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, with necrosis of bone ([place])    Diabetes mellitus type 2, uncontrolled, with complications 80/99/8338   Diabetic nephropathy ([place]) 06/26/2021   CKD (chronic kidney disease), stage III ([place]) 06/26/2021   HTN (hypertension) 06/24/2021   HIV (human immunodeficiency virus infection) ([place]) 06/24/2021   DM2 (  diabetes mellitus, type 2) ([place]) 06/24/2021   HLD (hyperlipidemia) 06/24/2021   Osteomyelitis of left foot ([place]) 06/24/2021   Elevated LFTs 06/24/2021   Acute-on-chronic kidney injury ([place]) 06/24/2021   Osteomyelitis of foot ([place]) 15/94/5859   Hardware complicating wound infection ([place])    Subacute osteomyelitis, left ankle and foot ([place])    Open displaced fracture of fifth metatarsal bone of left foot    Healthcare maintenance 04/03/2019   HIV disease ([place]) 10/27/2015   DM type 2 (diabetes mellitus, type 2) ([place]) 10/27/2015    Hyperlipidemia 10/27/2015   Essential hypertension 10/27/2015   Severe vulvar dysplasia, histologically confirmed 10/15/2015     Health Maintenance Due  Topic Date Due   FOOT EXAM  Never done   OPHTHALMOLOGY EXAM  Never done   Zoster Vaccines- Shingrix (1 of 2) Never done   COLONOSCOPY (Pts 45-58yr Insurance coverage will need to be confirmed)  Never done   PAP SMEAR-Modifier  01/07/2022   HEMOGLOBIN A1C  05/31/2022     Review of Systems 12 point ros is negative Physical Exam   BP 102/63   Pulse 82   Temp 98 F (36.7 C) (Oral)   Ht '5\' 9"'$  (1.753 m)   SpO2 96%   BMI 37.51 kg/m   Physical Exam  Constitutional:  oriented to person, place, and time. appears well-developed and well-nourished. No distress.  HENT: [place]/AT, PERRLA, no scleral icterus; left eye blindness Mouth/Throat: Oropharynx is clear and moist. No oropharyngeal exudate.  Cardiovascular: Normal rate, regular rhythm and normal heart sounds. Exam reveals no gallop and no friction rub.  No murmur heard.  Pulmonary/Chest: Effort normal and breath sounds normal. No respiratory distress.  has no wheezes.  Neck = supple, no nuchal rigidity Abdominal: Soft. Bowel sounds are normal.  exhibits no distension. There is no tenderness.  EYTW:KMQKMboot  Lab Results  Component Value Date   CD4TCELL 30 (L) 10/17/2022   Lab Results  Component Value Date   CD4TABS 575 10/17/2022   CD4TABS 638 05/19/2021   CD4TABS 654 02/22/2021   Lab Results  Component Value Date   HIV1RNAQUANT Not Detected 02/09/2022   Lab Results  Component Value Date   HEPBSAB NEG 10/13/2015   Lab Results  Component Value Date   LABRPR NON-REACTIVE 10/17/2022    CBC Lab Results  Component Value Date   WBC 10.4 10/17/2022   RBC 4.80 10/17/2022   HGB 15.8 (H) 10/17/2022   HCT 46.7 (H) 10/17/2022   PLT 389 10/17/2022   MCV 97.3 10/17/2022   MCH 32.9 10/17/2022   MCHC 33.8 10/17/2022   RDW 13.0 10/17/2022   LYMPHSABS 2,059 10/17/2022    MONOABS 0.9 07/04/2021   EOSABS 125 10/17/2022    BMET Lab Results  Component Value Date   NA 138 10/17/2022   K 4.4 10/17/2022   CL 103 10/17/2022   CO2 22 10/17/2022   GLUCOSE 200 (H) 10/17/2022   BUN 18 10/17/2022   CREATININE 1.69 (H) 10/17/2022   CALCIUM 9.4 10/17/2022   GFRNONAA 42 (L) 08/04/2022   GFRAA 48 (L) 02/22/2021      Assessment and Plan  HIV Disease = continue on taking dovato. Will check labs  Long term medication mamagement/CKD 3= will check cr to see if stable  Type 2 dm = continue with current regimen but recommend to follow up with pcp to see any adjustment  Health maintenance = recommend to get flu and covid vaccine. Deferred at this time

## 2022-10-19 LAB — CBC WITH DIFFERENTIAL/PLATELET
Absolute Monocytes: 624 cells/uL (ref 200–950)
Basophils Absolute: 21 cells/uL (ref 0–200)
Basophils Relative: 0.2 %
Eosinophils Absolute: 125 cells/uL (ref 15–500)
Eosinophils Relative: 1.2 %
HCT: 46.7 % — ABNORMAL HIGH (ref 35.0–45.0)
Hemoglobin: 15.8 g/dL — ABNORMAL HIGH (ref 11.7–15.5)
Lymphs Abs: 2059 cells/uL (ref 850–3900)
MCH: 32.9 pg (ref 27.0–33.0)
MCHC: 33.8 g/dL (ref 32.0–36.0)
MCV: 97.3 fL (ref 80.0–100.0)
MPV: 10.8 fL (ref 7.5–12.5)
Monocytes Relative: 6 %
Neutro Abs: 7571 cells/uL (ref 1500–7800)
Neutrophils Relative %: 72.8 %
Platelets: 389 10*3/uL (ref 140–400)
RBC: 4.8 10*6/uL (ref 3.80–5.10)
RDW: 13 % (ref 11.0–15.0)
Total Lymphocyte: 19.8 %
WBC: 10.4 10*3/uL (ref 3.8–10.8)

## 2022-10-19 LAB — COMPLETE METABOLIC PANEL WITH GFR
AG Ratio: 1.2 (calc) (ref 1.0–2.5)
ALT: 30 U/L — ABNORMAL HIGH (ref 6–29)
AST: 21 U/L (ref 10–35)
Albumin: 4 g/dL (ref 3.6–5.1)
Alkaline phosphatase (APISO): 58 U/L (ref 37–153)
BUN/Creatinine Ratio: 11 (calc) (ref 6–22)
BUN: 18 mg/dL (ref 7–25)
CO2: 22 mmol/L (ref 20–32)
Calcium: 9.4 mg/dL (ref 8.6–10.4)
Chloride: 103 mmol/L (ref 98–110)
Creat: 1.69 mg/dL — ABNORMAL HIGH (ref 0.50–1.03)
Globulin: 3.4 g/dL (calc) (ref 1.9–3.7)
Glucose, Bld: 200 mg/dL — ABNORMAL HIGH (ref 65–99)
Potassium: 4.4 mmol/L (ref 3.5–5.3)
Sodium: 138 mmol/L (ref 135–146)
Total Bilirubin: 0.5 mg/dL (ref 0.2–1.2)
Total Protein: 7.4 g/dL (ref 6.1–8.1)
eGFR: 35 mL/min/{1.73_m2} — ABNORMAL LOW (ref >=60)

## 2022-10-19 LAB — HIV-1 RNA QUANT-NO REFLEX-BLD
HIV 1 RNA Quant: NOT DETECTED Copies/mL
HIV-1 RNA Quant, Log: NOT DETECTED Log cps/mL

## 2022-10-19 LAB — RPR: RPR Ser Ql: NONREACTIVE

## 2022-11-02 ENCOUNTER — Ambulatory Visit: Payer: Medicare Other | Admitting: Orthopedic Surgery

## 2022-11-13 ENCOUNTER — Encounter: Payer: Self-pay | Admitting: Orthopedic Surgery

## 2022-11-13 ENCOUNTER — Ambulatory Visit (INDEPENDENT_AMBULATORY_CARE_PROVIDER_SITE_OTHER): Payer: Medicare Other | Admitting: Orthopedic Surgery

## 2022-11-13 DIAGNOSIS — T85848D Pain due to other internal prosthetic devices, implants and grafts, subsequent encounter: Secondary | ICD-10-CM

## 2022-11-13 DIAGNOSIS — E11610 Type 2 diabetes mellitus with diabetic neuropathic arthropathy: Secondary | ICD-10-CM | POA: Diagnosis not present

## 2022-11-13 DIAGNOSIS — Z981 Arthrodesis status: Secondary | ICD-10-CM | POA: Diagnosis not present

## 2022-11-13 NOTE — Progress Notes (Signed)
Office Visit Note   Patient: Katrina Wolfe           Date of Birth: 08-20-1966           MRN: [id number] Visit Date: 11/13/2022              Requested by: Wenda Low, MD [address] [place],  [place] [address] PCP: Wenda Low, MD  Chief Complaint  Patient presents with   Left Ankle - Follow-up    08/04/2022 removal deep HDW left ankle      HPI: Patient is a 57 year old woman who is status post removal of deep retained hardware for ankle fusion for Charcot collapse.  Patient has been fitted for a double upright brace at [facility] and will have the fitting this week.  Assessment & Plan: Visit Diagnoses:  1. H/O ankle fusion   2. Pain from implanted hardware, subsequent encounter   3. Charcot's arthropathy associated with type 2 diabetes mellitus ([place])     Plan: Continue with compression to help with the swelling.  Advance activities as tolerated with the extra-depth shoe and the double upright brace.  Follow-Up Instructions: Return in about 4 weeks (around 12/11/2022).   Ortho Exam  Patient is alert, oriented, no adenopathy, well-dressed, normal affect, normal respiratory effort. Examination of the left lower extremity there is a thin callus beneath the fifth metatarsal head but there are no ulcers no cellulitis no drainage.  With passive correction patient's foot is plantigrade this should do well with the double upright brace.  The swelling around the ankle continues to decrease.  There is no cellulitis.  Imaging: No results found. No images are attached to the encounter.  Labs: Lab Results  Component Value Date   HGBA1C 8.2 (H) 11/30/2021   HGBA1C 8.8 (H) 06/24/2021   ESRSEDRATE 53 (H) 08/15/2021   ESRSEDRATE 74 (H) 06/30/2021   CRP 18.5 (H) 08/15/2021   CRP 0.9 06/30/2021   REPTSTATUS 07/03/2021 FINAL 06/27/2021   GRAMSTAIN  06/27/2021    RARE WBC PRESENT,BOTH PMN AND MONONUCLEAR NO ORGANISMS SEEN    CULT  06/27/2021    RARE  CORYNEBACTERIUM SPECIES Standardized susceptibility testing for this organism is not available. NO ANAEROBES ISOLATED Performed at [facility], [place] [address] [place], [place] [address]    LABORGA PSEUDOMONAS AERUGINOSA 04/04/2019   LABORGA SERRATIA MARCESCENS 04/04/2019   LABORGA STAPHYLOCOCCUS AUREUS 04/04/2019     Lab Results  Component Value Date   ALBUMIN 3.5 08/04/2022   ALBUMIN 2.8 (L) 07/02/2021   ALBUMIN 2.8 (L) 07/01/2021    Lab Results  Component Value Date   MG 1.6 (L) 07/04/2021   MG 1.5 (L) 07/03/2021   MG 1.6 (L) 07/02/2021   No results found for: "VD25OH"  No results found for: "PREALBUMIN"    Latest Ref Rng & Units 10/17/2022    2:01 PM 08/04/2022    7:35 AM 02/09/2022    2:25 PM  CBC EXTENDED  WBC 3.8 - 10.8 Thousand/uL 10.4  10.6  12.1   RBC 3.80 - 5.10 Million/uL 4.80  4.87  4.92   Hemoglobin 11.7 - 15.5 g/dL 15.8  16.1  15.1   HCT 35.0 - 45.0 % 46.7  48.8  44.7   Platelets 140 - 400 Thousand/uL 389  370  417   NEUT# 1,500 - 7,800 cells/uL 7,571   7,780   Lymph# 850 - 3,900 cells/uL 2,059   3,025      There is no height  or weight on file to calculate BMI.  Orders:  No orders of the defined types were placed in this encounter.  No orders of the defined types were placed in this encounter.    Procedures: No procedures performed  Clinical Data: No additional findings.  ROS:  All other systems negative, except as noted in the HPI. Review of Systems  Objective: Vital Signs: There were no vitals taken for this visit.  Specialty Comments:  No specialty comments available.  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, with necrosis of bone ([place])    Diabetes mellitus type 2, uncontrolled, with complications 08/65/7846   Diabetic nephropathy ([place])  06/26/2021   CKD (chronic kidney disease), stage III ([place]) 06/26/2021   HTN (hypertension) 06/24/2021   HIV (human immunodeficiency virus infection) ([place]) 06/24/2021   DM2 (diabetes mellitus, type 2) ([place]) 06/24/2021   HLD (hyperlipidemia) 06/24/2021   Osteomyelitis of left foot ([place]) 06/24/2021   Elevated LFTs 06/24/2021   Acute-on-chronic kidney injury ([place]) 06/24/2021   Osteomyelitis of foot ([place]) 96/29/5284   Hardware complicating wound infection ([place])    Subacute osteomyelitis, left ankle and foot ([place])    Open displaced fracture of fifth metatarsal bone of left foot    Healthcare maintenance 04/03/2019   HIV disease ([place]) 10/27/2015   DM type 2 (diabetes mellitus, type 2) ([place]) 10/27/2015   Hyperlipidemia 10/27/2015   Essential hypertension 10/27/2015   Severe vulvar dysplasia, histologically confirmed 10/15/2015   Past Medical History:  Diagnosis Date   Abnormal uterine bleeding (AUB)    Arthritis    knees, fingers   Asthma    as child, no problem since 1995   CKD (chronic kidney disease), stage II    Congenital cardiomegaly    per pt has always been told since childhood and siblings    Diabetes mellitus without complication ([place])    Elevated liver function tests    GERD (gastroesophageal reflux disease)    History of genital warts    HIV (human immunodeficiency virus infection) ([place])    DX 1998--  MONITORED BY INFECTIOUS DISEASE-- DR Carlyle Basques   Hypercholesterolemia    Hypertension    Intermittent palpitations    mild-- no meds   Legally blind in left eye, as defined in [place]    trauma as child   Peripheral vascular disease ([place])    Pneumonia    Renal disorder    Tuberculosis    ? 2005 or 2007   Type 2 diabetes mellitus ([place])    Type II    Uterine fibroid    VIN III (vulvar intraepithelial neoplasia III)    and Verrucoid lesion on the mons   Wears glasses     Family History  Problem Relation Age of Onset   Hypertension Mother    Diabetes Father     Cancer Brother    Breast cancer Cousin     Past Surgical History:  Procedure Laterality Date   CESAREAN SECTION  2001  &  1324   CO2 LASER APPLICATION N/A 40/09/2724   Procedure: CO2 LASER APPLICATION AND WIDE VOCAL EXCSION OF LESION ON MONS PUBIS;  Surgeon: Marti Sleigh, MD;  Location: [facility];  Service: Gynecology;  Laterality: N/A;   CASE CANCELLED    CO2 LASER APPLICATION N/A 36/64/4034  Procedure: CO2 LASER OF VULVAR;  Surgeon: Marti Sleigh, MD;  Location: [facility];  Service: Gynecology;  Laterality: N/A;   CORNEAL TRANSPLANT Left 12/18/2004   failed   D & C HYSTEROSCOPY /  RESECTION FIBROID  12/18/2002   DILATATION & CURETTAGE/HYSTEROSCOPY WITH MYOSURE N/A 10/01/2019   Procedure: DILATATION & CURETTAGE/HYSTEROSCOPY WITH MYOSURE and HYDROTHERMAL ABLATION;  Surgeon: Thurnell Lose, MD;  Location: [facility];  Service: Gynecology;  Laterality: N/A;  HTA rep will be here. Confirmed on 09/25/19 CS   EYE SURGERY     FOOT ARTHRODESIS Left 11/30/2021   Procedure: LEFT TIBIOCALCANEAL FUSION;  Surgeon: Newt Minion, MD;  Location: [facility];  Service: Orthopedics;  Laterality: Left;   HARDWARE REMOVAL Left 04/04/2019   Procedure: EXCISION BONE AND REMOVE DEEP HARDWARE LEFT 5TH METATARSAL;  Surgeon: Newt Minion, MD;  Location: [facility];  Service: Orthopedics;  Laterality: Left;   HARDWARE REMOVAL Left 08/04/2022   Procedure: REMOVAL DEEP HARDWARE LEFT ANKLE;  Surgeon: Newt Minion, MD;  Location: [facility];  Service: Orthopedics;  Laterality: Left;   I & D EXTREMITY Left 06/27/2021   Procedure: IRRIGATION AND DEBRIDEMENT FOOT ULCER;  Surgeon: Jessy Oto, MD;  Location: [facility];  Service: Orthopedics;  Laterality: Left;   KNEE ARTHROSCOPY W/ ACL RECONSTRUCTION Bilateral 12/18/2006   ORIF TOE FRACTURE Left 08/02/2017   Procedure: OPEN REDUCTION INTERNAL FIXATION (ORIF) FIFTH METATARSAL (TOE) BASE FRACTURE;  Surgeon: Wylene Simmer,  MD;  Location: [facility];  Service: Orthopedics;  Laterality: Left;   VULVECTOMY N/A 01/07/2016   Procedure: WIDE LOCAL EXCISION;  Surgeon: Marti Sleigh, MD;  Location: [facility];  Service: Gynecology;  Laterality: N/A;  mons pubis as site   Social History   Occupational History   Not on file  Tobacco Use   Smoking status: Never   Smokeless tobacco: Never  Vaping Use   Vaping Use: Never used  Substance and Sexual Activity   Alcohol use: No    Alcohol/week: 0.0 standard drinks of alcohol   Drug use: No   Sexual activity: Yes    Partners: Male    Birth control/protection: None, Condom

## 2022-11-15 DIAGNOSIS — E1142 Type 2 diabetes mellitus with diabetic polyneuropathy: Secondary | ICD-10-CM | POA: Diagnosis not present

## 2022-11-15 DIAGNOSIS — E7800 Pure hypercholesterolemia, unspecified: Secondary | ICD-10-CM | POA: Diagnosis not present

## 2022-11-15 DIAGNOSIS — N1831 Chronic kidney disease, stage 3a: Secondary | ICD-10-CM | POA: Diagnosis not present

## 2022-11-15 DIAGNOSIS — E1140 Type 2 diabetes mellitus with diabetic neuropathy, unspecified: Secondary | ICD-10-CM | POA: Diagnosis not present

## 2022-11-15 DIAGNOSIS — I10 Essential (primary) hypertension: Secondary | ICD-10-CM | POA: Diagnosis not present

## 2022-12-07 ENCOUNTER — Encounter: Payer: Self-pay | Admitting: Orthopedic Surgery

## 2022-12-07 ENCOUNTER — Ambulatory Visit (INDEPENDENT_AMBULATORY_CARE_PROVIDER_SITE_OTHER): Payer: Medicare Other | Admitting: Orthopedic Surgery

## 2022-12-07 DIAGNOSIS — Z981 Arthrodesis status: Secondary | ICD-10-CM

## 2022-12-07 NOTE — Progress Notes (Signed)
Office Visit Note   Patient: Katrina Wolfe           Date of Birth: 1966-08-15           MRN: [id number] Visit Date: 12/07/2022              Requested by: Wenda Low, MD [address] [place],  [place] [address] PCP: Wenda Low, MD  Chief Complaint  Patient presents with   Left Ankle - Follow-up      HPI: Patient is a 56 year old woman who is status post tibial calcaneal fusion and removal of the anterior plate.  She still has a tibial calcaneal screw in place.  Patient states her ankle is doing fine has no complaints.  Assessment & Plan: Visit Diagnoses:  1. H/O ankle fusion     Plan: Patient will follow-up with [facility] for her shoe and bracing.  Callus was pared x 2.  Follow-Up Instructions: Return in about 2 months (around 02/07/2023).   Ortho Exam  Patient is alert, oriented, no adenopathy, well-dressed, normal affect, normal respiratory effort. Examination patient's foot is plantigrade she does have a plantar callus over the base of the fifth metatarsal and this was debrided no signs of infection.  There were other calluses in the mid aspect of her foot there was also debrided.  Imaging: No results found. No images are attached to the encounter.  Labs: Lab Results  Component Value Date   HGBA1C 8.2 (H) 11/30/2021   HGBA1C 8.8 (H) 06/24/2021   ESRSEDRATE 53 (H) 08/15/2021   ESRSEDRATE 74 (H) 06/30/2021   CRP 18.5 (H) 08/15/2021   CRP 0.9 06/30/2021   REPTSTATUS 07/03/2021 FINAL 06/27/2021   GRAMSTAIN  06/27/2021    RARE WBC PRESENT,BOTH PMN AND MONONUCLEAR NO ORGANISMS SEEN    CULT  06/27/2021    RARE CORYNEBACTERIUM SPECIES Standardized susceptibility testing for this organism is not available. NO ANAEROBES ISOLATED Performed at [facility], [place] [address] [place], [place] [address]    LABORGA PSEUDOMONAS AERUGINOSA 04/04/2019   LABORGA SERRATIA MARCESCENS 04/04/2019   LABORGA STAPHYLOCOCCUS AUREUS 04/04/2019     Lab  Results  Component Value Date   ALBUMIN 3.5 08/04/2022   ALBUMIN 2.8 (L) 07/02/2021   ALBUMIN 2.8 (L) 07/01/2021    Lab Results  Component Value Date   MG 1.6 (L) 07/04/2021   MG 1.5 (L) 07/03/2021   MG 1.6 (L) 07/02/2021   No results found for: "VD25OH"  No results found for: "PREALBUMIN"    Latest Ref Rng & Units 10/17/2022    2:01 PM 08/04/2022    7:35 AM 02/09/2022    2:25 PM  CBC EXTENDED  WBC 3.8 - 10.8 Thousand/uL 10.4  10.6  12.1   RBC 3.80 - 5.10 Million/uL 4.80  4.87  4.92   Hemoglobin 11.7 - 15.5 g/dL 15.8  16.1  15.1   HCT 35.0 - 45.0 % 46.7  48.8  44.7   Platelets 140 - 400 Thousand/uL 389  370  417   NEUT# 1,500 - 7,800 cells/uL 7,571   7,780   Lymph# 850 - 3,900 cells/uL 2,059   3,025      There is no height or weight on file to calculate BMI.  Orders:  No orders of the defined types were placed in this encounter.  No orders of the defined types were placed in this encounter.    Procedures: No procedures performed  Clinical Data: No additional findings.  ROS:  All  other systems negative, except as noted in the HPI. Review of Systems  Objective: Vital Signs: There were no vitals taken for this visit.  Specialty Comments:  No specialty comments available.  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, with necrosis of bone ([place])    Diabetes mellitus type 2, uncontrolled, with complications 62/70/3500   Diabetic nephropathy ([place]) 06/26/2021   CKD (chronic kidney disease), stage III ([place]) 06/26/2021   HTN (hypertension) 06/24/2021   HIV (human immunodeficiency virus infection) ([place]) 06/24/2021   DM2 (diabetes mellitus, type 2) ([place]) 06/24/2021   HLD (hyperlipidemia) 06/24/2021   Osteomyelitis of left foot ([place]) 06/24/2021   Elevated LFTs 06/24/2021    Acute-on-chronic kidney injury ([place]) 06/24/2021   Osteomyelitis of foot ([place]) 93/81/8299   Hardware complicating wound infection ([place])    Subacute osteomyelitis, left ankle and foot ([place])    Open displaced fracture of fifth metatarsal bone of left foot    Healthcare maintenance 04/03/2019   HIV disease ([place]) 10/27/2015   DM type 2 (diabetes mellitus, type 2) ([place]) 10/27/2015   Hyperlipidemia 10/27/2015   Essential hypertension 10/27/2015   Severe vulvar dysplasia, histologically confirmed 10/15/2015   Past Medical History:  Diagnosis Date   Abnormal uterine bleeding (AUB)    Arthritis    knees, fingers   Asthma    as child, no problem since 1995   CKD (chronic kidney disease), stage II    Congenital cardiomegaly    per pt has always been told since childhood and siblings    Diabetes mellitus without complication ([place])    Elevated liver function tests    GERD (gastroesophageal reflux disease)    History of genital warts    HIV (human immunodeficiency virus infection) ([place])    DX 1998--  MONITORED BY INFECTIOUS DISEASE-- DR Carlyle Basques   Hypercholesterolemia    Hypertension    Intermittent palpitations    mild-- no meds   Legally blind in left eye, as defined in [place]    trauma as child   Peripheral vascular disease ([place])    Pneumonia    Renal disorder    Tuberculosis    ? 2005 or 2007   Type 2 diabetes mellitus ([place])    Type II    Uterine fibroid    VIN III (vulvar intraepithelial neoplasia III)    and Verrucoid lesion on the mons   Wears glasses     Family History  Problem Relation Age of Onset   Hypertension Mother    Diabetes Father    Cancer Brother    Breast cancer Cousin     Past Surgical History:  Procedure Laterality Date   CESAREAN SECTION  2001  &  3716   CO2 LASER APPLICATION N/A 96/78/9381   Procedure: CO2 LASER APPLICATION AND WIDE VOCAL EXCSION OF LESION ON MONS PUBIS;  Surgeon: Marti Sleigh, MD;  Location: [facility];   Service: Gynecology;  Laterality: N/A;   CASE CANCELLED    CO2 LASER APPLICATION N/A 01/75/1025   Procedure: CO2 LASER OF VULVAR;  Surgeon: Marti Sleigh, MD;  Location: [facility];  Service: Gynecology;  Laterality: N/A;   CORNEAL TRANSPLANT Left 12/18/2004   failed   D & C HYSTEROSCOPY /  RESECTION FIBROID  12/18/2002   DILATATION & CURETTAGE/HYSTEROSCOPY WITH MYOSURE  N/A 10/01/2019   Procedure: DILATATION & CURETTAGE/HYSTEROSCOPY WITH MYOSURE and HYDROTHERMAL ABLATION;  Surgeon: Thurnell Lose, MD;  Location: [facility];  Service: Gynecology;  Laterality: N/A;  HTA rep will be here. Confirmed on 09/25/19 CS   EYE SURGERY     FOOT ARTHRODESIS Left 11/30/2021   Procedure: LEFT TIBIOCALCANEAL FUSION;  Surgeon: Newt Minion, MD;  Location: [facility];  Service: Orthopedics;  Laterality: Left;   HARDWARE REMOVAL Left 04/04/2019   Procedure: EXCISION BONE AND REMOVE DEEP HARDWARE LEFT 5TH METATARSAL;  Surgeon: Newt Minion, MD;  Location: [facility];  Service: Orthopedics;  Laterality: Left;   HARDWARE REMOVAL Left 08/04/2022   Procedure: REMOVAL DEEP HARDWARE LEFT ANKLE;  Surgeon: Newt Minion, MD;  Location: [facility];  Service: Orthopedics;  Laterality: Left;   I & D EXTREMITY Left 06/27/2021   Procedure: IRRIGATION AND DEBRIDEMENT FOOT ULCER;  Surgeon: Jessy Oto, MD;  Location: [facility];  Service: Orthopedics;  Laterality: Left;   KNEE ARTHROSCOPY W/ ACL RECONSTRUCTION Bilateral 12/18/2006   ORIF TOE FRACTURE Left 08/02/2017   Procedure: OPEN REDUCTION INTERNAL FIXATION (ORIF) FIFTH METATARSAL (TOE) BASE FRACTURE;  Surgeon: Wylene Simmer, MD;  Location: [facility];  Service: Orthopedics;  Laterality: Left;   VULVECTOMY N/A 01/07/2016   Procedure: WIDE LOCAL EXCISION;  Surgeon: Marti Sleigh, MD;  Location[facility];  Service: Gynecology;  Laterality: N/A;  mons pubis as site   Social History   Occupational History    Not on file  Tobacco Use   Smoking status: Never   Smokeless tobacco: Never  Vaping Use   Vaping Use: Never used  Substance and Sexual Activity   Alcohol use: No    Alcohol/week: 0.0 standard drinks of alcohol   Drug use: No   Sexual activity: Yes    Partners: Male    Birth control/protection: None, Condom

## 2022-12-15 DIAGNOSIS — E7800 Pure hypercholesterolemia, unspecified: Secondary | ICD-10-CM | POA: Diagnosis not present

## 2022-12-15 DIAGNOSIS — N1831 Chronic kidney disease, stage 3a: Secondary | ICD-10-CM | POA: Diagnosis not present

## 2022-12-15 DIAGNOSIS — I10 Essential (primary) hypertension: Secondary | ICD-10-CM | POA: Diagnosis not present

## 2022-12-15 DIAGNOSIS — E1142 Type 2 diabetes mellitus with diabetic polyneuropathy: Secondary | ICD-10-CM | POA: Diagnosis not present

## 2022-12-19 DIAGNOSIS — Z794 Long term (current) use of insulin: Secondary | ICD-10-CM | POA: Diagnosis not present

## 2022-12-19 DIAGNOSIS — N1831 Chronic kidney disease, stage 3a: Secondary | ICD-10-CM | POA: Diagnosis not present

## 2022-12-19 DIAGNOSIS — E7800 Pure hypercholesterolemia, unspecified: Secondary | ICD-10-CM | POA: Diagnosis not present

## 2022-12-19 DIAGNOSIS — E1165 Type 2 diabetes mellitus with hyperglycemia: Secondary | ICD-10-CM | POA: Diagnosis not present

## 2022-12-19 DIAGNOSIS — M21962 Unspecified acquired deformity of left lower leg: Secondary | ICD-10-CM | POA: Diagnosis not present

## 2022-12-19 DIAGNOSIS — H5442A3 Blindness left eye category 3, normal vision right eye: Secondary | ICD-10-CM | POA: Diagnosis not present

## 2022-12-19 DIAGNOSIS — I10 Essential (primary) hypertension: Secondary | ICD-10-CM | POA: Diagnosis not present

## 2022-12-19 DIAGNOSIS — E1142 Type 2 diabetes mellitus with diabetic polyneuropathy: Secondary | ICD-10-CM | POA: Diagnosis not present

## 2023-01-15 DIAGNOSIS — N1831 Chronic kidney disease, stage 3a: Secondary | ICD-10-CM | POA: Diagnosis not present

## 2023-01-15 DIAGNOSIS — I10 Essential (primary) hypertension: Secondary | ICD-10-CM | POA: Diagnosis not present

## 2023-01-15 DIAGNOSIS — E7800 Pure hypercholesterolemia, unspecified: Secondary | ICD-10-CM | POA: Diagnosis not present

## 2023-01-15 DIAGNOSIS — E1142 Type 2 diabetes mellitus with diabetic polyneuropathy: Secondary | ICD-10-CM | POA: Diagnosis not present

## 2023-02-07 DIAGNOSIS — E232 Diabetes insipidus: Secondary | ICD-10-CM | POA: Diagnosis not present

## 2023-02-07 DIAGNOSIS — M19072 Primary osteoarthritis, left ankle and foot: Secondary | ICD-10-CM | POA: Diagnosis not present

## 2023-02-08 ENCOUNTER — Encounter: Payer: Self-pay | Admitting: Physician Assistant

## 2023-02-08 ENCOUNTER — Ambulatory Visit (INDEPENDENT_AMBULATORY_CARE_PROVIDER_SITE_OTHER): Payer: 59 | Admitting: Physician Assistant

## 2023-02-08 ENCOUNTER — Ambulatory Visit: Payer: Self-pay

## 2023-02-08 DIAGNOSIS — Z981 Arthrodesis status: Secondary | ICD-10-CM

## 2023-02-08 NOTE — Progress Notes (Signed)
Office Visit Note   Patient: Katrina Wolfe           Date of Birth: 06-22-1966           MRN: [id number] Visit Date: 02/08/2023              Requested by: Wenda Low, MD [address] [place],  [place] [address] PCP: Wenda Low, MD  Chief Complaint  Patient presents with   Left Ankle - Pain      HPI: Katrina Wolfe is a pleasant 57 year old woman who is a patient of Dr. Jess Barters.  She is status post extensive tibial calcaneal fusion with subsequent hardware removal of the plate several months ago.  She last saw Dr. Sharol Given in December and was doing well.  She comes in today complaining of pain lateral and inferior ankle pain and swelling.  This has been going on for about 3 weeks.  No particular injury.  No recent fever or chills though she was ill when this first began.  Assessment & Plan: Visit Diagnoses:  1. H/O ankle fusion     Plan: History of total tibial calcaneal fusion.  Remaining screw has migrated inferiorly and laterally and is now prominent and I think this is probably causing her pain and swelling.  There is no evidence of any infection either clinically or on x-ray that I can appreciate.  She has not had any fever or chills.  She is going to limit her weightbearing and is scheduled to see Dr. Sharol Given on Monday  Follow-Up Instructions: No follow-ups on file.   Ortho Exam  Patient is alert, oriented, no adenopathy, well-dressed, normal affect, normal respiratory effort. Examination patient appears well.  Her foot is warm.  She has no breakdown in skin.  She does have moderate soft tissue swelling without cellulitis.  She does have tenderness over the lateral inferior aspect of her foot and ankle.  No skin compromise.  Imaging: No results found. No images are attached to the encounter.  Labs: Lab Results  Component Value Date   HGBA1C 8.2 (H) 11/30/2021   HGBA1C 8.8 (H) 06/24/2021   ESRSEDRATE 53 (H) 08/15/2021   ESRSEDRATE 74 (H) 06/30/2021   CRP  18.5 (H) 08/15/2021   CRP 0.9 06/30/2021   REPTSTATUS 07/03/2021 FINAL 06/27/2021   GRAMSTAIN  06/27/2021    RARE WBC PRESENT,BOTH PMN AND MONONUCLEAR NO ORGANISMS SEEN    CULT  06/27/2021    RARE CORYNEBACTERIUM SPECIES Standardized susceptibility testing for this organism is not available. NO ANAEROBES ISOLATED Performed at [facility], [place] [address], [place], [place] [address]    LABORGA PSEUDOMONAS AERUGINOSA 04/04/2019   LABORGA SERRATIA MARCESCENS 04/04/2019   LABORGA STAPHYLOCOCCUS AUREUS 04/04/2019     Lab Results  Component Value Date   ALBUMIN 3.5 08/04/2022   ALBUMIN 2.8 (L) 07/02/2021   ALBUMIN 2.8 (L) 07/01/2021    Lab Results  Component Value Date   MG 1.6 (L) 07/04/2021   MG 1.5 (L) 07/03/2021   MG 1.6 (L) 07/02/2021   No results found for: "VD25OH"  No results found for: "PREALBUMIN"    Latest Ref Rng & Units 10/17/2022    2:01 PM 08/04/2022    7:35 AM 02/09/2022    2:25 PM  CBC EXTENDED  WBC 3.8 - 10.8 Thousand/uL 10.4  10.6  12.1   RBC 3.80 - 5.10 Million/uL 4.80  4.87  4.92   Hemoglobin 11.7 - 15.5 g/dL 15.8  16.1  15.1  HCT 35.0 - 45.0 % 46.7  48.8  44.7   Platelets 140 - 400 Thousand/uL 389  370  417   NEUT# 1,500 - 7,800 cells/uL 7,571   7,780   Lymph# 850 - 3,900 cells/uL 2,059   3,025      There is no height or weight on file to calculate BMI.  Orders:  Orders Placed This Encounter  Procedures   XR Ankle Complete Left   No orders of the defined types were placed in this encounter.    Procedures: No procedures performed  Clinical Data: No additional findings.  ROS:  All other systems negative, except as noted in the HPI. Review of Systems  Objective: Vital Signs: There were no vitals taken for this visit.  Specialty Comments:  No specialty comments available.  PMFS History: Patient Active Problem List   Diagnosis Date Noted   S/P ankle fusion 11/30/2021   Charcot's joint, left ankle and foot    Diabetic  ulcer of midfoot associated with diabetes mellitus due to underlying condition, with necrosis of bone ([place])    Diabetes mellitus type 2, uncontrolled, with complications A999333   Diabetic nephropathy ([place]) 06/26/2021   CKD (chronic kidney disease), stage III ([place]) 06/26/2021   HTN (hypertension) 06/24/2021   HIV (human immunodeficiency virus infection) ([place]) 06/24/2021   DM2 (diabetes mellitus, type 2) ([place]) 06/24/2021   HLD (hyperlipidemia) 06/24/2021   Osteomyelitis of left foot ([place]) 06/24/2021   Elevated LFTs 06/24/2021   Acute-on-chronic kidney injury ([place]) 06/24/2021   Osteomyelitis of foot ([place]) 123456   Hardware complicating wound infection ([place])    Subacute osteomyelitis, left ankle and foot ([place])    Open displaced fracture of fifth metatarsal bone of left foot    Healthcare maintenance 04/03/2019   HIV disease ([place]) 10/27/2015   DM type 2 (diabetes mellitus, type 2) ([place]) 10/27/2015   Hyperlipidemia 10/27/2015   Essential hypertension 10/27/2015   Severe vulvar dysplasia, histologically confirmed 10/15/2015   Past Medical History:  Diagnosis Date   Abnormal uterine bleeding (AUB)    Arthritis    knees, fingers   Asthma    as child, no problem since 1995   CKD (chronic kidney disease), stage II    Congenital cardiomegaly    per pt has always been told since childhood and siblings    Diabetes mellitus without complication ([place])    Elevated liver function tests    GERD (gastroesophageal reflux disease)    History of genital warts    HIV (human immunodeficiency virus infection) ([place])    DX 1998--  MONITORED BY INFECTIOUS DISEASE-- DR Carlyle Basques   Hypercholesterolemia    Hypertension    Intermittent palpitations    mild-- no meds   Legally blind in left eye, as defined in [place]    trauma as child   Peripheral vascular disease ([place])    Pneumonia    Renal disorder    Tuberculosis    ? 2005 or 2007   Type 2 diabetes mellitus ([place])    Type II    Uterine  fibroid    VIN III (vulvar intraepithelial neoplasia III)    and Verrucoid lesion on the mons   Wears glasses     Family History  Problem Relation Age of Onset   Hypertension Mother    Diabetes Father    Cancer Brother    Breast cancer Cousin     Past Surgical History:  Procedure Laterality Date   CESAREAN SECTION  2001  &  Q000111Q   CO2 LASER APPLICATION N/A 0000000   Procedure: CO2 LASER APPLICATION AND WIDE VOCAL EXCSION OF LESION ON MONS PUBIS;  Surgeon: Marti Sleigh, MD;  Location: [facility];  Service: Gynecology;  Laterality: N/A;   CASE CANCELLED    CO2 LASER APPLICATION N/A 123456   Procedure: CO2 LASER OF VULVAR;  Surgeon: Marti Sleigh, MD;  Location: [facility];  Service: Gynecology;  Laterality: N/A;   CORNEAL TRANSPLANT Left 12/18/2004   failed   D & C HYSTEROSCOPY /  RESECTION FIBROID  12/18/2002   DILATATION & CURETTAGE/HYSTEROSCOPY WITH MYOSURE N/A 10/01/2019   Procedure: DILATATION & CURETTAGE/HYSTEROSCOPY WITH MYOSURE and HYDROTHERMAL ABLATION;  Surgeon: Thurnell Lose, MD;  Location: [facility];  Service: Gynecology;  Laterality: N/A;  HTA rep will be here. Confirmed on 09/25/19 CS   EYE SURGERY     FOOT ARTHRODESIS Left 11/30/2021   Procedure: LEFT TIBIOCALCANEAL FUSION;  Surgeon: Newt Minion, MD;  Location: [facility];  Service: Orthopedics;  Laterality: Left;   HARDWARE REMOVAL Left 04/04/2019   Procedure: EXCISION BONE AND REMOVE DEEP HARDWARE LEFT 5TH METATARSAL;  Surgeon: Newt Minion, MD;  Location: [facility];  Service: Orthopedics;  Laterality: Left;   HARDWARE REMOVAL Left 08/04/2022   Procedure: REMOVAL DEEP HARDWARE LEFT ANKLE;  Surgeon: Newt Minion, MD;  Location: [facility];  Service: Orthopedics;  Laterality: Left;   I & D EXTREMITY Left 06/27/2021   Procedure: IRRIGATION AND DEBRIDEMENT FOOT ULCER;  Surgeon: Jessy Oto, MD;  Location: [facility];  Service: Orthopedics;  Laterality: Left;    KNEE ARTHROSCOPY W/ ACL RECONSTRUCTION Bilateral 12/18/2006   ORIF TOE FRACTURE Left 08/02/2017   Procedure: OPEN REDUCTION INTERNAL FIXATION (ORIF) FIFTH METATARSAL (TOE) BASE FRACTURE;  Surgeon: Wylene Simmer, MD;  Location: [facility];  Service: Orthopedics;  Laterality: Left;   VULVECTOMY N/A 01/07/2016   Procedure: WIDE LOCAL EXCISION;  Surgeon: Marti Sleigh, MD;  Location: [facility];  Service: Gynecology;  Laterality: N/A;  mons pubis as site   Social History   Occupational History   Not on file  Tobacco Use   Smoking status: Never   Smokeless tobacco: Never  Vaping Use   Vaping Use: Never used  Substance and Sexual Activity   Alcohol use: No    Alcohol/week: 0.0 standard drinks of alcohol   Drug use: No   Sexual activity: Yes    Partners: Male    Birth control/protection: None, Condom

## 2023-02-12 ENCOUNTER — Encounter: Payer: Self-pay | Admitting: Orthopedic Surgery

## 2023-02-12 ENCOUNTER — Ambulatory Visit (INDEPENDENT_AMBULATORY_CARE_PROVIDER_SITE_OTHER): Payer: 59 | Admitting: Orthopedic Surgery

## 2023-02-12 ENCOUNTER — Other Ambulatory Visit: Payer: Self-pay | Admitting: Internal Medicine

## 2023-02-12 DIAGNOSIS — E11610 Type 2 diabetes mellitus with diabetic neuropathic arthropathy: Secondary | ICD-10-CM | POA: Diagnosis not present

## 2023-02-12 DIAGNOSIS — T85848D Pain due to other internal prosthetic devices, implants and grafts, subsequent encounter: Secondary | ICD-10-CM

## 2023-02-12 DIAGNOSIS — Z981 Arthrodesis status: Secondary | ICD-10-CM | POA: Diagnosis not present

## 2023-02-12 MED ORDER — DOXYCYCLINE HYCLATE 100 MG PO TABS: 100.0000 mg | ORAL_TABLET | Freq: Two times a day (BID) | ORAL | 0 refills | Status: DC

## 2023-02-12 NOTE — Progress Notes (Signed)
Office Visit Note   Patient: Katrina Wolfe           Date of Birth: 1966/09/20           MRN: [id number] Visit Date: 02/12/2023              Requested by: Wenda Low, MD [address] [place],  [place] [address] PCP: Wenda Low, MD  Chief Complaint  Patient presents with   Left Ankle - Follow-up    11/30/2021 ankle fusion 08/04/2022 removal anterior tibial plate and associated screws       HPI: Patient is a 57 year old woman who was seen in for left ankle pain and deformity.  Patient states she has recently had increased pain and increased deformity.  She is status post ankle fusion December 2022 and had removal of the hardware approximately 6 months ago in August 2023.  Patient was recently seen in the office showing loosening around the retained intramedullary screw.  Patient states she just started developing drainage on the plantar aspect of her heel.  Assessment & Plan: Visit Diagnoses:  1. H/O ankle fusion   2. Pain from implanted hardware, subsequent encounter   3. Charcot's arthropathy associated with type 2 diabetes mellitus ([place])     Plan: With the lucency around the screw and progressive deformity patient is having progressive Charcot collapse of the ankle.  We will start her on doxycycline today plan for revision anterior fusion.  Discussed that if patient has developed a deep infection she may require an amputation.  Patient has been actively treating the Charcot collapse of the left ankle for 4 years.  Follow-Up Instructions: No follow-ups on file.   Ortho Exam  Patient is alert, oriented, no adenopathy, well-dressed, normal affect, normal respiratory effort. Examination patient has a palpable dorsalis pedis pulse there is no swelling around the ankle no cellulitis.  She has progressive varus deformity of the ankle with weightbearing on the lateral side of her foot.  Review of the radiographs shows lucency around the intramedullary screw  consistent with either infection or loosening from the Charcot arthropathy.  Patient has a ulcer over the plantar aspect of her foot in the area where the hardware is now prominent.  There is clear drainage that may be consistent with the screw loosening or possible infection.  Imaging: No results found. No images are attached to the encounter.  Labs: Lab Results  Component Value Date   HGBA1C 8.2 (H) 11/30/2021   HGBA1C 8.8 (H) 06/24/2021   ESRSEDRATE 53 (H) 08/15/2021   ESRSEDRATE 74 (H) 06/30/2021   CRP 18.5 (H) 08/15/2021   CRP 0.9 06/30/2021   REPTSTATUS 07/03/2021 FINAL 06/27/2021   GRAMSTAIN  06/27/2021    RARE WBC PRESENT,BOTH PMN AND MONONUCLEAR NO ORGANISMS SEEN    CULT  06/27/2021    RARE CORYNEBACTERIUM SPECIES Standardized susceptibility testing for this organism is not available. NO ANAEROBES ISOLATED Performed at [facility], [place] [address] [place], [place] [address]    LABORGA PSEUDOMONAS AERUGINOSA 04/04/2019   LABORGA SERRATIA MARCESCENS 04/04/2019   LABORGA STAPHYLOCOCCUS AUREUS 04/04/2019     Lab Results  Component Value Date   ALBUMIN 3.5 08/04/2022   ALBUMIN 2.8 (L) 07/02/2021   ALBUMIN 2.8 (L) 07/01/2021    Lab Results  Component Value Date   MG 1.6 (L) 07/04/2021   MG 1.5 (L) 07/03/2021   MG 1.6 (L) 07/02/2021   No results found for: "VD25OH"  No results found for: "  PREALBUMIN"    Latest Ref Rng & Units 10/17/2022    2:01 PM 08/04/2022    7:35 AM 02/09/2022    2:25 PM  CBC EXTENDED  WBC 3.8 - 10.8 Thousand/uL 10.4  10.6  12.1   RBC 3.80 - 5.10 Million/uL 4.80  4.87  4.92   Hemoglobin 11.7 - 15.5 g/dL 15.8  16.1  15.1   HCT 35.0 - 45.0 % 46.7  48.8  44.7   Platelets 140 - 400 Thousand/uL 389  370  417   NEUT# 1,500 - 7,800 cells/uL 7,571   7,780   Lymph# 850 - 3,900 cells/uL 2,059   3,025      There is no height or weight on file to calculate BMI.  Orders:  No orders of the defined types were placed in this  encounter.  Meds ordered this encounter  Medications   doxycycline (VIBRA-TABS) 100 MG tablet    Sig: Take 1 tablet (100 mg total) by mouth 2 (two) times daily.    Dispense:  60 tablet    Refill:  0     Procedures: No procedures performed  Clinical Data: No additional findings.  ROS:  All other systems negative, except as noted in the HPI. Review of Systems  Objective: Vital Signs: There were no vitals taken for this visit.  Specialty Comments:  No specialty comments available.  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, with necrosis of bone ([place])    Diabetes mellitus type 2, uncontrolled, with complications A999333   Diabetic nephropathy ([place]) 06/26/2021   CKD (chronic kidney disease), stage III ([place]) 06/26/2021   HTN (hypertension) 06/24/2021   HIV (human immunodeficiency virus infection) ([place]) 06/24/2021   DM2 (diabetes mellitus, type 2) ([place]) 06/24/2021   HLD (hyperlipidemia) 06/24/2021   Osteomyelitis of left foot ([place]) 06/24/2021   Elevated LFTs 06/24/2021   Acute-on-chronic kidney injury ([place]) 06/24/2021   Osteomyelitis of foot ([place]) 123456   Hardware complicating wound infection ([place])    Subacute osteomyelitis, left ankle and foot ([place])    Open displaced fracture of fifth metatarsal bone of left foot    Healthcare maintenance 04/03/2019   HIV disease ([place]) 10/27/2015   DM type 2 (diabetes mellitus, type 2) ([place]) 10/27/2015   Hyperlipidemia 10/27/2015   Essential hypertension 10/27/2015   Severe vulvar dysplasia, histologically confirmed 10/15/2015   Past Medical History:  Diagnosis Date   Abnormal uterine bleeding (AUB)    Arthritis    knees, fingers   Asthma    as child, no problem since 1995   CKD (chronic kidney disease), stage II    Congenital cardiomegaly     per pt has always been told since childhood and siblings    Diabetes mellitus without complication ([place])    Elevated liver function tests    GERD (gastroesophageal reflux disease)    History of genital warts    HIV (human immunodeficiency virus infection) ([place])    DX 1998--  MONITORED BY INFECTIOUS DISEASE-- DR Carlyle Basques   Hypercholesterolemia    Hypertension    Intermittent palpitations    mild-- no meds   Legally blind in left eye, as defined in [place]    trauma as child   Peripheral vascular disease ([place])    Pneumonia    Renal disorder    Tuberculosis    ?  2005 or 2007   Type 2 diabetes mellitus ([place])    Type II    Uterine fibroid    VIN III (vulvar intraepithelial neoplasia III)    and Verrucoid lesion on the mons   Wears glasses     Family History  Problem Relation Age of Onset   Hypertension Mother    Diabetes Father    Cancer Brother    Breast cancer Cousin     Past Surgical History:  Procedure Laterality Date   CESAREAN SECTION  2001  &  Q000111Q   CO2 LASER APPLICATION N/A 0000000   Procedure: CO2 LASER APPLICATION AND WIDE VOCAL EXCSION OF LESION ON MONS PUBIS;  Surgeon: Marti Sleigh, MD;  Location: [facility];  Service: Gynecology;  Laterality: N/A;   CASE CANCELLED    CO2 LASER APPLICATION N/A 123456   Procedure: CO2 LASER OF VULVAR;  Surgeon: Marti Sleigh, MD;  [facility];  Service: Gynecology;  Laterality: N/A;   CORNEAL TRANSPLANT Left 12/18/2004   failed   D & C HYSTEROSCOPY /  RESECTION FIBROID  12/18/2002   DILATATION & CURETTAGE/HYSTEROSCOPY WITH MYOSURE N/A 10/01/2019   Procedure: DILATATION & CURETTAGE/HYSTEROSCOPY WITH MYOSURE and HYDROTHERMAL ABLATION;  Surgeon: Thurnell Lose, MD;  Location: [facility];  Service: Gynecology;  Laterality: N/A;  HTA rep will be here. Confirmed on 09/25/19 CS   EYE SURGERY     FOOT ARTHRODESIS Left 11/30/2021   Procedure: LEFT  TIBIOCALCANEAL FUSION;  Surgeon: Newt Minion, MD;  Location: [facility];  Service: Orthopedics;  Laterality: Left;   HARDWARE REMOVAL Left 04/04/2019   Procedure: EXCISION BONE AND REMOVE DEEP HARDWARE LEFT 5TH METATARSAL;  Surgeon: Newt Minion, MD;  Location: [facility];  Service: Orthopedics;  Laterality: Left;   HARDWARE REMOVAL Left 08/04/2022   Procedure: REMOVAL DEEP HARDWARE LEFT ANKLE;  Surgeon: Newt Minion, MD;  Location: [facility];  Service: Orthopedics;  Laterality: Left;   I & D EXTREMITY Left 06/27/2021   Procedure: IRRIGATION AND DEBRIDEMENT FOOT ULCER;  Surgeon: Jessy Oto, MD;  Location: [facility];  Service: Orthopedics;  Laterality: Left;   KNEE ARTHROSCOPY W/ ACL RECONSTRUCTION Bilateral 12/18/2006   ORIF TOE FRACTURE Left 08/02/2017   Procedure: OPEN REDUCTION INTERNAL FIXATION (ORIF) FIFTH METATARSAL (TOE) BASE FRACTURE;  Surgeon: Wylene Simmer, MD;  Location: [facility];  Service: Orthopedics;  Laterality: Left;   VULVECTOMY N/A 01/07/2016   Procedure: WIDE LOCAL EXCISION;  Surgeon: Marti Sleigh, MD;  Location[facility];  Service: Gynecology;  Laterality: N/A;  mons pubis as site   Social History   Occupational History   Not on file  Tobacco Use   Smoking status: Never   Smokeless tobacco: Never  Vaping Use   Vaping Use: Never used  Substance and Sexual Activity   Alcohol use: No    Alcohol/week: 0.0 standard drinks of alcohol   Drug use: No   Sexual activity: Yes    Partners: Male    Birth control/protection: None, Condom

## 2023-02-13 ENCOUNTER — Telehealth: Payer: Self-pay

## 2023-02-13 DIAGNOSIS — I10 Essential (primary) hypertension: Secondary | ICD-10-CM | POA: Diagnosis not present

## 2023-02-13 DIAGNOSIS — E7800 Pure hypercholesterolemia, unspecified: Secondary | ICD-10-CM | POA: Diagnosis not present

## 2023-02-13 DIAGNOSIS — E1142 Type 2 diabetes mellitus with diabetic polyneuropathy: Secondary | ICD-10-CM | POA: Diagnosis not present

## 2023-02-13 DIAGNOSIS — N1831 Chronic kidney disease, stage 3a: Secondary | ICD-10-CM | POA: Diagnosis not present

## 2023-02-13 NOTE — Telephone Encounter (Signed)
A 30 d/s [facility]. She will complete the abx treatment and is pending being scheduled for a revision on her fusion on foot. I will call to notify Ronny Bacon.

## 2023-02-13 NOTE — Telephone Encounter (Signed)
Spoke with Katrina Wolfe letting her know as of right now she has just been given a 30 days supply, not a long term medication at the moment

## 2023-02-13 NOTE — Telephone Encounter (Signed)
Natasha with [employer] would like to if the Rx for doxycycline will be an ongoing medication for patient and if so, this would need to be sent to [facility], [address] [place], [place] [address].  Stated that patient gets mail order.  Cb# [phone],  Please advise.  Thank you

## 2023-02-19 ENCOUNTER — Encounter (HOSPITAL_COMMUNITY): Payer: Self-pay | Admitting: Orthopedic Surgery

## 2023-02-19 NOTE — Progress Notes (Signed)
PCP - Dr Wenda Low Cardiologist - n/a Infectious Diseases - Dr Carlyle Basques  Chest x-ray - n/a EKG - 06/07/22 Stress Test - n/a ECHO - 06/27/22 Cardiac Cath - n/a  ICD Pacemaker/Loop - n/a  Sleep Study -  n/a CPAP - none  Diabetes Type 2 Do not take Glimepiride on the morning of surgery.     THE MORNING OF SURGERY, do not take Humalog Insulin unless your CBG is greater than 220 mg/dL.  If CBG greater than 220 mg/dL, then you may take 1/2 of your sliding scale correction dose.   If your blood sugar is less than 70 mg/dL, you will need to treat for low blood sugar: Treat a low blood sugar (less than 70 mg/dL) with  cup of clear juice (cranberry or apple), 4 glucose tablets, OR glucose gel. Recheck blood sugar in 15 minutes after treatment (to make sure it is greater than 70 mg/dL). If your blood sugar is not greater than 70 mg/dL on recheck, call [phone] for further instructions.  Aspirin Instructions: Follow your surgeon's instructions on when to stop aspirin prior to surgery,  If no instructions were given by your surgeon then you will need to call the office for those instructions.  ERAS: Clear liquids til 6:45 AM DOS.  Anesthesia review: Yes  STOP now taking any Aspirin (unless otherwise instructed by your surgeon), Aleve, Naproxen, Ibuprofen, Motrin, Advil, Goody's, BC's, all herbal medications, fish oil, and all vitamins.   Coronavirus Screening Do you have any of the following symptoms:  Cough yes/no: No Fever (>100.8F)  yes/no: No Runny nose yes/no: No Sore throat yes/no: No Difficulty breathing/shortness of breath  yes/no: No  Have you traveled in the last 14 days and where? yes/no: No  Patient verbalized understanding of instructions that were given via phone.

## 2023-02-21 ENCOUNTER — Encounter (HOSPITAL_COMMUNITY): Payer: Self-pay | Admitting: Orthopedic Surgery

## 2023-02-21 ENCOUNTER — Encounter (HOSPITAL_COMMUNITY)
Admission: AD | Disposition: A | Discharge status: Home / Self Care | Payer: Self-pay | Source: Ambulatory Visit | Attending: Orthopedic Surgery

## 2023-02-21 ENCOUNTER — Ambulatory Visit (HOSPITAL_COMMUNITY): Payer: 59 | Admitting: Physician Assistant

## 2023-02-21 ENCOUNTER — Other Ambulatory Visit: Payer: Self-pay

## 2023-02-21 ENCOUNTER — Observation Stay (HOSPITAL_COMMUNITY)
Admission: AD | Admit: 2023-02-21 | Discharge: 2023-02-22 | Disposition: A | Discharge status: Home / Self Care | Payer: 59 | Source: Ambulatory Visit | Attending: Orthopedic Surgery | Admitting: Orthopedic Surgery

## 2023-02-21 ENCOUNTER — Ambulatory Visit (HOSPITAL_COMMUNITY): Payer: 59

## 2023-02-21 DIAGNOSIS — Z79899 Other long term (current) drug therapy: Secondary | ICD-10-CM | POA: Diagnosis not present

## 2023-02-21 DIAGNOSIS — Z21 Asymptomatic human immunodeficiency virus [HIV] infection status: Secondary | ICD-10-CM | POA: Diagnosis not present

## 2023-02-21 DIAGNOSIS — T84117A Breakdown (mechanical) of internal fixation device of bone of left lower leg, initial encounter: Secondary | ICD-10-CM

## 2023-02-21 DIAGNOSIS — E1151 Type 2 diabetes mellitus with diabetic peripheral angiopathy without gangrene: Secondary | ICD-10-CM

## 2023-02-21 DIAGNOSIS — Z981 Arthrodesis status: Secondary | ICD-10-CM

## 2023-02-21 DIAGNOSIS — Z7982 Long term (current) use of aspirin: Secondary | ICD-10-CM | POA: Insufficient documentation

## 2023-02-21 DIAGNOSIS — E1122 Type 2 diabetes mellitus with diabetic chronic kidney disease: Secondary | ICD-10-CM | POA: Insufficient documentation

## 2023-02-21 DIAGNOSIS — Z7984 Long term (current) use of oral hypoglycemic drugs: Secondary | ICD-10-CM | POA: Diagnosis not present

## 2023-02-21 DIAGNOSIS — I129 Hypertensive chronic kidney disease with stage 1 through stage 4 chronic kidney disease, or unspecified chronic kidney disease: Secondary | ICD-10-CM | POA: Insufficient documentation

## 2023-02-21 DIAGNOSIS — M14672 Charcot's joint, left ankle and foot: Principal | ICD-10-CM | POA: Insufficient documentation

## 2023-02-21 DIAGNOSIS — M9689 Other intraoperative and postprocedural complications and disorders of the musculoskeletal system: Principal | ICD-10-CM

## 2023-02-21 DIAGNOSIS — B20 Human immunodeficiency virus [HIV] disease: Secondary | ICD-10-CM | POA: Insufficient documentation

## 2023-02-21 DIAGNOSIS — J45909 Unspecified asthma, uncomplicated: Secondary | ICD-10-CM | POA: Insufficient documentation

## 2023-02-21 DIAGNOSIS — T8489XA Other specified complication of internal orthopedic prosthetic devices, implants and grafts, initial encounter: Secondary | ICD-10-CM | POA: Diagnosis not present

## 2023-02-21 DIAGNOSIS — Z7985 Long-term (current) use of injectable non-insulin antidiabetic drugs: Secondary | ICD-10-CM | POA: Diagnosis not present

## 2023-02-21 DIAGNOSIS — Z794 Long term (current) use of insulin: Secondary | ICD-10-CM | POA: Insufficient documentation

## 2023-02-21 DIAGNOSIS — G8918 Other acute postprocedural pain: Secondary | ICD-10-CM | POA: Diagnosis not present

## 2023-02-21 DIAGNOSIS — I10 Essential (primary) hypertension: Secondary | ICD-10-CM | POA: Diagnosis not present

## 2023-02-21 DIAGNOSIS — N182 Chronic kidney disease, stage 2 (mild): Secondary | ICD-10-CM | POA: Insufficient documentation

## 2023-02-21 HISTORY — PX: ANKLE FUSION: SHX881

## 2023-02-21 LAB — BASIC METABOLIC PANEL
Anion gap: 10 (ref 5–15)
BUN: 15 mg/dL (ref 6–20)
CO2: 22 mmol/L (ref 22–32)
Calcium: 8.9 mg/dL (ref 8.9–10.3)
Chloride: 104 mmol/L (ref 98–111)
Creatinine, Ser: 1.34 mg/dL — ABNORMAL HIGH (ref 0.44–1.00)
GFR, Estimated: 47 mL/min — ABNORMAL LOW (ref >60)
Glucose, Bld: 63 mg/dL — ABNORMAL LOW (ref 70–99)
Potassium: 3.8 mmol/L (ref 3.5–5.1)
Sodium: 136 mmol/L (ref 135–145)

## 2023-02-21 LAB — GLUCOSE, CAPILLARY
Glucose-Capillary: 87 mg/dL (ref 70–99)
Glucose-Capillary: 69 mg/dL — ABNORMAL LOW (ref 70–99)
Glucose-Capillary: 115 mg/dL — ABNORMAL HIGH (ref 70–99)
Glucose-Capillary: 116 mg/dL — ABNORMAL HIGH (ref 70–99)
Glucose-Capillary: 115 mg/dL — ABNORMAL HIGH (ref 70–99)
Glucose-Capillary: 244 mg/dL — ABNORMAL HIGH (ref 70–99)
Glucose-Capillary: 219 mg/dL — ABNORMAL HIGH (ref 70–99)

## 2023-02-21 LAB — CBC
HCT: 45.5 % (ref 36.0–46.0)
Hemoglobin: 15.5 g/dL — ABNORMAL HIGH (ref 12.0–15.0)
MCH: 33.3 pg (ref 26.0–34.0)
MCHC: 34.1 g/dL (ref 30.0–36.0)
MCV: 97.6 fL (ref 80.0–100.0)
Platelets: 466 10*3/uL — ABNORMAL HIGH (ref 150–400)
RBC: 4.66 MIL/uL (ref 3.87–5.11)
RDW: 14.9 % (ref 11.5–15.5)
WBC: 15.9 10*3/uL — ABNORMAL HIGH (ref 4.0–10.5)
nRBC: 0 % (ref 0.0–0.2)

## 2023-02-21 LAB — SURGICAL PCR SCREEN
MRSA, PCR: NEGATIVE
Staphylococcus aureus: NEGATIVE

## 2023-02-21 LAB — HEMOGLOBIN A1C
Hgb A1c MFr Bld: 8.5 % — ABNORMAL HIGH (ref 4.8–5.6)
Mean Plasma Glucose: 197 mg/dL

## 2023-02-21 SURGERY — ARTHRODESIS ANKLE
Anesthesia: Regional | Site: Ankle | Laterality: Left

## 2023-02-21 MED ORDER — METHOCARBAMOL 500 MG PO TABS
500.0000 mg | ORAL_TABLET | Freq: Four times a day (QID) | ORAL | Status: DC | PRN
  Administered 2023-02-21: 500 mg via ORAL
  Filled 2023-02-21: qty 1

## 2023-02-21 MED ORDER — DOLUTEGRAVIR-LAMIVUDINE 50-300 MG PO TABS
1.0000 | ORAL_TABLET | Freq: Every day | ORAL | Status: DC
  Administered 2023-02-21: 1 via ORAL
  Filled 2023-02-21 (×2): qty 1

## 2023-02-21 MED ORDER — DOCUSATE SODIUM 100 MG PO CAPS
100.0000 mg | ORAL_CAPSULE | Freq: Two times a day (BID) | ORAL | Status: DC
  Administered 2023-02-21 – 2023-02-22 (×3): 100 mg via ORAL
  Filled 2023-02-21 (×3): qty 1

## 2023-02-21 MED ORDER — AMLODIPINE BESYLATE 5 MG PO TABS
5.0000 mg | ORAL_TABLET | Freq: Every morning | ORAL | Status: DC
  Administered 2023-02-22: 5 mg via ORAL
  Filled 2023-02-21: qty 1

## 2023-02-21 MED ORDER — HYDROMORPHONE HCL 1 MG/ML IJ SOLN: 0.5000 mg | INTRAMUSCULAR | Status: DC | PRN

## 2023-02-21 MED ORDER — METHOCARBAMOL 1000 MG/10ML IJ SOLN: 500.0000 mg | Freq: Four times a day (QID) | INTRAVENOUS | Status: DC | PRN

## 2023-02-21 MED ORDER — LISINOPRIL 20 MG PO TABS
20.0000 mg | ORAL_TABLET | Freq: Every day | ORAL | Status: DC
  Administered 2023-02-22: 20 mg via ORAL
  Filled 2023-02-21: qty 1

## 2023-02-21 MED ORDER — POLYETHYLENE GLYCOL 3350 17 G PO PACK: 17.0000 g | PACK | Freq: Every day | ORAL | Status: DC | PRN

## 2023-02-21 MED ORDER — LACTATED RINGERS IV SOLN: INTRAVENOUS | Status: DC

## 2023-02-21 MED ORDER — OXYCODONE HCL 5 MG PO TABS
10.0000 mg | ORAL_TABLET | ORAL | Status: DC | PRN
  Administered 2023-02-21: 15 mg via ORAL
  Filled 2023-02-21: qty 3

## 2023-02-21 MED ORDER — ONDANSETRON HCL 4 MG/2ML IJ SOLN: 4.0000 mg | Freq: Four times a day (QID) | INTRAMUSCULAR | Status: DC | PRN

## 2023-02-21 MED ORDER — ACETAMINOPHEN 325 MG PO TABS: 325.0000 mg | ORAL_TABLET | Freq: Four times a day (QID) | ORAL | Status: DC | PRN

## 2023-02-21 MED ORDER — ONDANSETRON HCL 4 MG/2ML IJ SOLN
INTRAMUSCULAR | Status: DC | PRN
  Administered 2023-02-21: 4 mg via INTRAVENOUS

## 2023-02-21 MED ORDER — ACETAMINOPHEN 160 MG/5ML PO SOLN: 325.0000 mg | ORAL | Status: DC | PRN

## 2023-02-21 MED ORDER — ROSUVASTATIN CALCIUM 5 MG PO TABS
5.0000 mg | ORAL_TABLET | Freq: Every morning | ORAL | Status: DC
  Administered 2023-02-22: 5 mg via ORAL
  Filled 2023-02-21: qty 1

## 2023-02-21 MED ORDER — PROPOFOL 10 MG/ML IV BOLUS
INTRAVENOUS | Status: DC | PRN
  Administered 2023-02-21 (×3): 20 mg via INTRAVENOUS
  Administered 2023-02-21: 30 mg via INTRAVENOUS

## 2023-02-21 MED ORDER — POVIDONE-IODINE 10 % EX SWAB
2.0000 | Freq: Once | CUTANEOUS | Status: AC
  Administered 2023-02-21: 2 via TOPICAL

## 2023-02-21 MED ORDER — PROPOFOL 500 MG/50ML IV EMUL
INTRAVENOUS | Status: DC | PRN
  Administered 2023-02-21: 100 ug/kg/min via INTRAVENOUS

## 2023-02-21 MED ORDER — ORAL CARE MOUTH RINSE: 15.0000 mL | Freq: Once | OROMUCOSAL | Status: AC

## 2023-02-21 MED ORDER — ONDANSETRON HCL 4 MG/2ML IJ SOLN
INTRAMUSCULAR | Status: AC
  Filled 2023-02-21: qty 2

## 2023-02-21 MED ORDER — INSULIN GLARGINE-YFGN 100 UNIT/ML ~~LOC~~ SOLN
20.0000 [IU] | Freq: Every day | SUBCUTANEOUS | Status: DC
  Administered 2023-02-21: 20 [IU] via SUBCUTANEOUS
  Filled 2023-02-21 (×2): qty 0.2

## 2023-02-21 MED ORDER — LISINOPRIL-HYDROCHLOROTHIAZIDE 20-25 MG PO TABS: 1.0000 | ORAL_TABLET | Freq: Every day | ORAL | Status: DC

## 2023-02-21 MED ORDER — FENTANYL CITRATE (PF) 100 MCG/2ML IJ SOLN
INTRAMUSCULAR | Status: DC | PRN
  Administered 2023-02-21 (×2): 50 ug via INTRAVENOUS

## 2023-02-21 MED ORDER — PROMETHAZINE HCL 25 MG/ML IJ SOLN: 6.2500 mg | INTRAMUSCULAR | Status: DC | PRN

## 2023-02-21 MED ORDER — METOCLOPRAMIDE HCL 5 MG PO TABS: 5.0000 mg | ORAL_TABLET | Freq: Three times a day (TID) | ORAL | Status: DC | PRN

## 2023-02-21 MED ORDER — BISACODYL 10 MG RE SUPP: 10.0000 mg | Freq: Every day | RECTAL | Status: DC | PRN

## 2023-02-21 MED ORDER — ACETAMINOPHEN 10 MG/ML IV SOLN: 1000.0000 mg | Freq: Once | INTRAVENOUS | Status: DC | PRN

## 2023-02-21 MED ORDER — ACETAMINOPHEN 325 MG PO TABS: 325.0000 mg | ORAL_TABLET | ORAL | Status: DC | PRN

## 2023-02-21 MED ORDER — INSULIN ASPART 100 UNIT/ML IJ SOLN
0.0000 [IU] | Freq: Three times a day (TID) | INTRAMUSCULAR | Status: DC
  Administered 2023-02-21: 5 [IU] via SUBCUTANEOUS
  Administered 2023-02-22: 8 [IU] via SUBCUTANEOUS
  Administered 2023-02-22: 3 [IU] via SUBCUTANEOUS

## 2023-02-21 MED ORDER — LIDOCAINE 2% (20 MG/ML) 5 ML SYRINGE
INTRAMUSCULAR | Status: DC | PRN
  Administered 2023-02-21: 20 mg via INTRAVENOUS

## 2023-02-21 MED ORDER — CEFAZOLIN SODIUM-DEXTROSE 2-4 GM/100ML-% IV SOLN
2.0000 g | INTRAVENOUS | Status: AC
  Administered 2023-02-21: 2 g via INTRAVENOUS
  Filled 2023-02-21: qty 100

## 2023-02-21 MED ORDER — FENTANYL CITRATE (PF) 100 MCG/2ML IJ SOLN: 25.0000 ug | INTRAMUSCULAR | Status: DC | PRN

## 2023-02-21 MED ORDER — SODIUM CHLORIDE 0.9 % IV SOLN: INTRAVENOUS | Status: DC

## 2023-02-21 MED ORDER — FENTANYL CITRATE (PF) 100 MCG/2ML IJ SOLN
INTRAMUSCULAR | Status: AC
  Filled 2023-02-21: qty 2

## 2023-02-21 MED ORDER — INSULIN ASPART 100 UNIT/ML IJ SOLN: 0.0000 [IU] | INTRAMUSCULAR | Status: DC | PRN

## 2023-02-21 MED ORDER — OXYCODONE HCL 5 MG PO TABS: 5.0000 mg | ORAL_TABLET | ORAL | Status: DC | PRN

## 2023-02-21 MED ORDER — ASPIRIN 81 MG PO TBEC
81.0000 mg | DELAYED_RELEASE_TABLET | Freq: Every day | ORAL | Status: DC
  Administered 2023-02-21 – 2023-02-22 (×2): 81 mg via ORAL
  Filled 2023-02-21 (×2): qty 1

## 2023-02-21 MED ORDER — DEXTROSE 50 % IV SOLN
12.5000 g | INTRAVENOUS | Status: AC
  Administered 2023-02-21: 12.5 g via INTRAVENOUS
  Filled 2023-02-21: qty 50

## 2023-02-21 MED ORDER — INSULIN ASPART 100 UNIT/ML IJ SOLN
4.0000 [IU] | Freq: Three times a day (TID) | INTRAMUSCULAR | Status: DC
  Administered 2023-02-21 – 2023-02-22 (×3): 4 [IU] via SUBCUTANEOUS

## 2023-02-21 MED ORDER — PROPOFOL 10 MG/ML IV BOLUS
INTRAVENOUS | Status: AC
  Filled 2023-02-21: qty 20

## 2023-02-21 MED ORDER — CHLORHEXIDINE GLUCONATE 0.12 % MT SOLN
15.0000 mL | Freq: Once | OROMUCOSAL | Status: AC
Start: 2023-02-21 — End: 2023-02-21
  Administered 2023-02-21: 15 mL via OROMUCOSAL
  Filled 2023-02-21: qty 15

## 2023-02-21 MED ORDER — MIDAZOLAM HCL 2 MG/2ML IJ SOLN
INTRAMUSCULAR | Status: AC
  Filled 2023-02-21: qty 2

## 2023-02-21 MED ORDER — CHLORHEXIDINE GLUCONATE 4 % EX LIQD: 60.0000 mL | Freq: Once | CUTANEOUS | Status: DC

## 2023-02-21 MED ORDER — MAGNESIUM CITRATE PO SOLN: 1.0000 | Freq: Once | ORAL | Status: DC | PRN

## 2023-02-21 MED ORDER — GLIMEPIRIDE 4 MG PO TABS
4.0000 mg | ORAL_TABLET | Freq: Every day | ORAL | Status: DC
  Administered 2023-02-22: 4 mg via ORAL
  Filled 2023-02-21: qty 1

## 2023-02-21 MED ORDER — ONDANSETRON HCL 4 MG PO TABS: 4.0000 mg | ORAL_TABLET | Freq: Four times a day (QID) | ORAL | Status: DC | PRN

## 2023-02-21 MED ORDER — MIDAZOLAM HCL 5 MG/5ML IJ SOLN
INTRAMUSCULAR | Status: DC | PRN
  Administered 2023-02-21: 2 mg via INTRAVENOUS

## 2023-02-21 MED ORDER — PHENYLEPHRINE 80 MCG/ML (10ML) SYRINGE FOR IV PUSH (FOR BLOOD PRESSURE SUPPORT)
PREFILLED_SYRINGE | INTRAVENOUS | Status: AC
  Filled 2023-02-21: qty 10

## 2023-02-21 MED ORDER — HYDROCHLOROTHIAZIDE 25 MG PO TABS
25.0000 mg | ORAL_TABLET | Freq: Every day | ORAL | Status: DC
  Administered 2023-02-22: 25 mg via ORAL
  Filled 2023-02-21: qty 1

## 2023-02-21 MED ORDER — PHENYLEPHRINE 80 MCG/ML (10ML) SYRINGE FOR IV PUSH (FOR BLOOD PRESSURE SUPPORT)
PREFILLED_SYRINGE | INTRAVENOUS | Status: DC | PRN
  Administered 2023-02-21 (×4): 80 ug via INTRAVENOUS
  Administered 2023-02-21 (×2): 160 ug via INTRAVENOUS
  Administered 2023-02-21 (×4): 80 ug via INTRAVENOUS

## 2023-02-21 MED ORDER — OXYCODONE HCL 5 MG/5ML PO SOLN: 5.0000 mg | Freq: Once | ORAL | Status: DC | PRN

## 2023-02-21 MED ORDER — 0.9 % SODIUM CHLORIDE (POUR BTL) OPTIME
TOPICAL | Status: DC | PRN
  Administered 2023-02-21: 2000 mL

## 2023-02-21 MED ORDER — METOCLOPRAMIDE HCL 5 MG/ML IJ SOLN: 5.0000 mg | Freq: Three times a day (TID) | INTRAMUSCULAR | Status: DC | PRN

## 2023-02-21 MED ORDER — GABAPENTIN 100 MG PO CAPS
100.0000 mg | ORAL_CAPSULE | Freq: Four times a day (QID) | ORAL | Status: DC
  Administered 2023-02-21 – 2023-02-22 (×4): 100 mg via ORAL
  Filled 2023-02-21 (×4): qty 1

## 2023-02-21 MED ORDER — AMISULPRIDE (ANTIEMETIC) 5 MG/2ML IV SOLN: 10.0000 mg | Freq: Once | INTRAVENOUS | Status: DC | PRN

## 2023-02-21 MED ORDER — OXYCODONE HCL 5 MG PO TABS: 5.0000 mg | ORAL_TABLET | Freq: Once | ORAL | Status: DC | PRN

## 2023-02-21 MED ORDER — NORETHINDRONE ACETATE 5 MG PO TABS
5.0000 mg | ORAL_TABLET | Freq: Every day | ORAL | Status: DC
  Administered 2023-02-21 – 2023-02-22 (×2): 5 mg via ORAL
  Filled 2023-02-21 (×2): qty 1

## 2023-02-21 MED ORDER — CEFAZOLIN SODIUM-DEXTROSE 2-4 GM/100ML-% IV SOLN
2.0000 g | Freq: Four times a day (QID) | INTRAVENOUS | Status: AC
  Administered 2023-02-21 – 2023-02-22 (×3): 2 g via INTRAVENOUS
  Filled 2023-02-21 (×3): qty 100

## 2023-02-21 SURGICAL SUPPLY — 58 items
BAG COUNTER SPONGE SURGICOUNT (BAG) ×1 IMPLANT
BAG SPNG CNTER NS LX DISP (BAG) ×1
BANDAGE ESMARK 6X9 LF (GAUZE/BANDAGES/DRESSINGS) IMPLANT
BIT DRILL SA 2.7 NS (DRILL) IMPLANT
BIT DRILL SA 3.5 NS (DRILL) IMPLANT
BLADE SAW SGTL 83.5X18.5 (BLADE) ×1 IMPLANT
BLADE SURG 10 STRL SS (BLADE) IMPLANT
BNDG CMPR 5X4 CHSV STRCH STRL (GAUZE/BANDAGES/DRESSINGS) ×1
BNDG CMPR 9X6 STRL LF SNTH (GAUZE/BANDAGES/DRESSINGS) ×1
BNDG COHESIVE 4X5 TAN STRL LF (GAUZE/BANDAGES/DRESSINGS) IMPLANT
BNDG ESMARK 6X9 LF (GAUZE/BANDAGES/DRESSINGS) ×1
BNDG GAUZE DERMACEA FLUFF 4 (GAUZE/BANDAGES/DRESSINGS) IMPLANT
BNDG GZE DERMACEA 4 6PLY (GAUZE/BANDAGES/DRESSINGS) ×1
COVER SURGICAL LIGHT HANDLE (MISCELLANEOUS) ×2 IMPLANT
DRAPE INCISE IOBAN 66X45 STRL (DRAPES) ×1 IMPLANT
DRAPE OEC MINIVIEW 54X84 (DRAPES) ×1 IMPLANT
DRAPE U-SHAPE 47X51 STRL (DRAPES) ×1 IMPLANT
DRILL SA 2.7 NS (DRILL) ×1
DRILL SA 3.5 NS (DRILL) ×1
DRIVER CANN SA T20 (ORTHOPEDIC DISPOSABLE SUPPLIES) IMPLANT
DRSG ADAPTIC 3X8 NADH LF (GAUZE/BANDAGES/DRESSINGS) IMPLANT
DURAPREP 26ML APPLICATOR (WOUND CARE) ×1 IMPLANT
ELECT REM PT RETURN 9FT ADLT (ELECTROSURGICAL) ×1
ELECTRODE REM PT RTRN 9FT ADLT (ELECTROSURGICAL) ×1 IMPLANT
GAUZE PAD ABD 8X10 STRL (GAUZE/BANDAGES/DRESSINGS) IMPLANT
GAUZE SPONGE 4X4 12PLY STRL (GAUZE/BANDAGES/DRESSINGS) IMPLANT
GLOVE BIOGEL PI IND STRL 9 (GLOVE) ×1 IMPLANT
GLOVE SURG ORTHO 9.0 STRL STRW (GLOVE) ×1 IMPLANT
GOWN STRL REUS W/ TWL XL LVL3 (GOWN DISPOSABLE) ×3 IMPLANT
GOWN STRL REUS W/TWL XL LVL3 (GOWN DISPOSABLE) ×3
K-WIRE OUTRIGGER LNG SA NS (WIRE) ×1
KIT BASIN OR (CUSTOM PROCEDURE TRAY) ×1 IMPLANT
KIT TURNOVER KIT B (KITS) ×1 IMPLANT
KWIRE OUTRIGGER LNG SA NS (WIRE) IMPLANT
MANIFOLD NEPTUNE II (INSTRUMENTS) ×1 IMPLANT
NS IRRIG 1000ML POUR BTL (IV SOLUTION) ×1 IMPLANT
PACK ORTHO EXTREMITY (CUSTOM PROCEDURE TRAY) ×1 IMPLANT
PAD ARMBOARD 7.5X6 YLW CONV (MISCELLANEOUS) ×2 IMPLANT
PAD CAST 4YDX4 CTTN HI CHSV (CAST SUPPLIES) IMPLANT
PADDING CAST COTTON 4X4 STRL (CAST SUPPLIES) ×1
PLATE ANT ANKLE SM SA LT (Plate) IMPLANT
PUTTY DBM STAGRAFT PLUS 10CC (Putty) IMPLANT
SCREW LOCK SA NS 3.5X26 (Screw) IMPLANT
SCREW NLOCK SA 5X26 (Screw) IMPLANT
SCREW NLOCK SA 5X28 (Screw) IMPLANT
SCREW NLOCK SA NS 3.5X28 (Screw) IMPLANT
SPONGE T-LAP 18X18 ~~LOC~~+RFID (SPONGE) ×1 IMPLANT
STAPLER VISISTAT 35W (STAPLE) ×1 IMPLANT
SUCTION FRAZIER HANDLE 10FR (MISCELLANEOUS) ×1
SUCTION TUBE FRAZIER 10FR DISP (MISCELLANEOUS) ×1 IMPLANT
SUT ETHILON 2 0 PSLX (SUTURE) ×3 IMPLANT
SUT SILK 2 0 (SUTURE) ×1
SUT SILK 2-0 18XBRD TIE 12 (SUTURE) IMPLANT
SUT VIC AB 2-0 CTB1 (SUTURE) ×2 IMPLANT
TOWEL GREEN STERILE (TOWEL DISPOSABLE) ×1 IMPLANT
TOWEL GREEN STERILE FF (TOWEL DISPOSABLE) ×1 IMPLANT
TUBE CONNECTING 12X1/4 (SUCTIONS) ×1 IMPLANT
WATER STERILE IRR 1000ML POUR (IV SOLUTION) ×1 IMPLANT

## 2023-02-21 NOTE — Anesthesia Procedure Notes (Signed)
Anesthesia Regional Block: Adductor canal block   Pre-Anesthetic Checklist: , timeout performed,  Correct Patient, Correct Site, Correct Laterality,  Correct Procedure, Correct Position, site marked,  Risks and benefits discussed,  Surgical consent,  Pre-op evaluation,  At surgeon's request and post-op pain management  Laterality: Left  Prep: chloraprep       Needles:  Injection technique: Single-shot  Needle Type: Echogenic Stimulator Needle     Needle Length: 9cm  Needle Gauge: 21     Additional Needles:   Procedures:,,,, ultrasound used (permanent image in chart),,    Narrative:  Start time: 02/21/2023 9:45 AM End time: 02/21/2023 9:50 AM Injection made incrementally with aspirations every 5 mL.  Performed by: Personally  Anesthesiologist: Effie Berkshire, MD  Additional Notes: Discussed risks and benefits of the nerve block in detail, including but not limited vascular injury, permanent nerve damage and infection.   Patient tolerated the procedure well. Local anesthetic introduced in an incremental fashion under minimal resistance after negative aspirations. No paresthesias were elicited. After completion of the procedure, no acute issues were identified and patient continued to be monitored by RN.

## 2023-02-21 NOTE — Progress Notes (Signed)
Orthopedic Tech Progress Note Patient Details:  Katrina Wolfe [birth year] [id number]  Ortho Devices Type of Ortho Device: CAM walker Ortho Device/Splint Interventions: Ordered, Adjustment   Post Interventions Patient Tolerated: Well Instructions Provided: Care of device, Adjustment of device  [facility] 02/21/2023, 5:35 PM

## 2023-02-21 NOTE — Anesthesia Postprocedure Evaluation (Signed)
Anesthesia Post Note  Patient: Katrina Wolfe  Procedure(s) Performed: REVISION LEFT ANKLE FUSION (Left: Ankle)     Patient location during evaluation: PACU Anesthesia Type: Regional Level of consciousness: awake and alert Pain management: pain level controlled Vital Signs Assessment: post-procedure vital signs reviewed and stable Respiratory status: spontaneous breathing, nonlabored ventilation, respiratory function stable and patient connected to nasal cannula oxygen Cardiovascular status: stable and blood pressure returned to baseline Postop Assessment: no apparent nausea or vomiting Anesthetic complications: no  No notable events documented.  Last Vitals:  Vitals:   02/21/23 1357 02/21/23 1419  BP: 134/74 127/80  Pulse: 95 99  Resp: 17 18  Temp: 36.9 C 36.7 C  SpO2: 100% 98%    Last Pain:  Vitals:   02/21/23 1357  TempSrc:   PainSc: 0-No pain                 Effie Berkshire

## 2023-02-21 NOTE — Anesthesia Preprocedure Evaluation (Addendum)
Anesthesia Evaluation  Patient identified by MRN, date of birth, ID band Patient awake    Reviewed: Allergy & Precautions, Patient's Chart, lab work & pertinent test results  Airway Mallampati: II  TM Distance: >3 FB Neck ROM: Full    Dental  (+) Chipped, Missing, Poor Dentition, Dental Advisory Given   Pulmonary asthma    breath sounds clear to auscultation       Cardiovascular hypertension, Pt. on medications + Peripheral Vascular Disease   Rhythm:Regular Rate:Normal  Echo: 1. Left ventricular ejection fraction, by estimation, is 55 to 60%. The  left ventricle has normal function. The left ventricle has no regional  wall motion abnormalities. There is mild concentric left ventricular  hypertrophy. Left ventricular diastolic  parameters are consistent with Grade I diastolic dysfunction (impaired  relaxation). The average left ventricular global longitudinal strain is  -18.6 %. The global longitudinal strain is normal.   2. Right ventricular systolic function is normal. The right ventricular  size is normal. There is normal pulmonary artery systolic pressure.   3. The mitral valve is normal in structure. No evidence of mitral valve  regurgitation. No evidence of mitral stenosis.   4. The aortic valve is tricuspid. Aortic valve regurgitation is not  visualized. No aortic stenosis is present.   5. The inferior vena cava is normal in size with greater than 50%  respiratory variability, suggesting right atrial pressure of 3 mmHg.     Neuro/Psych negative neurological ROS  negative psych ROS   GI/Hepatic Neg liver ROS,GERD  ,,  Endo/Other  diabetes, Type 2, Oral Hypoglycemic Agents, Insulin Dependent    Renal/GU Renal InsufficiencyRenal disease     Musculoskeletal  (+) Arthritis ,    Abdominal   Peds  Hematology  (+) HIV  Anesthesia Other Findings   Reproductive/Obstetrics                              Anesthesia Physical Anesthesia Plan  ASA: 3  Anesthesia Plan: Regional   Post-op Pain Management:    Induction: Intravenous  PONV Risk Score and Plan: 3 and Ondansetron, Propofol infusion and Midazolam  Airway Management Planned: Natural Airway and Simple Face Mask  Additional Equipment: None  Intra-op Plan:   Post-operative Plan: Extubation in OR  Informed Consent: I have reviewed the patients History and Physical, chart, labs and discussed the procedure including the risks, benefits and alternatives for the proposed anesthesia with the patient or authorized representative who has indicated his/her understanding and acceptance.       Plan Discussed with: CRNA  Anesthesia Plan Comments:        Anesthesia Quick Evaluation

## 2023-02-21 NOTE — Progress Notes (Signed)
PT Cancellation Note  Patient Details Name: Katrina Wolfe MRN: [id number] DOB: May 12, 1966   Cancelled Treatment:    Reason Eval/Treat Not Completed: Other (comment).  To OR today for L ankle fusion revision.  Follow up tomorrow.   Ramond Dial 02/21/2023, 2:26 PM  Mee Hives, PT PhD Acute Rehab Dept. Number: [place] [facility] [place]

## 2023-02-21 NOTE — Anesthesia Procedure Notes (Signed)
Anesthesia Regional Block: Popliteal block   Pre-Anesthetic Checklist: , timeout performed,  Correct Patient, Correct Site, Correct Laterality,  Correct Procedure, Correct Position, site marked,  Risks and benefits discussed,  Surgical consent,  Pre-op evaluation,  At surgeon's request and post-op pain management  Laterality: Left  Prep: chloraprep       Needles:  Injection technique: Single-shot  Needle Type: Echogenic Stimulator Needle     Needle Length: 9cm  Needle Gauge: 21     Additional Needles:   Procedures:,,,, ultrasound used (permanent image in chart),,    Narrative:  Start time: 02/21/2023 9:40 AM End time: 02/21/2023 9:45 AM Injection made incrementally with aspirations every 5 mL.  Performed by: Personally  Anesthesiologist: Effie Berkshire, MD  Additional Notes: Discussed risks and benefits of the nerve block in detail, including but not limited vascular injury, permanent nerve damage and infection.   Patient tolerated the procedure well. Local anesthetic introduced in an incremental fashion under minimal resistance after negative aspirations. No paresthesias were elicited. After completion of the procedure, no acute issues were identified and patient continued to be monitored by RN.

## 2023-02-21 NOTE — Transfer of Care (Signed)
Immediate Anesthesia Transfer of Care Note  Patient: Katrina Wolfe  Procedure(s) Performed: REVISION LEFT ANKLE FUSION (Left: Ankle)  Patient Location: PACU  Anesthesia Type:MAC combined with regional for post-op pain  Level of Consciousness: drowsy and patient cooperative  Airway & Oxygen Therapy: Patient Spontanous Breathing and Patient connected to nasal cannula oxygen  Post-op Assessment: Report given to RN and Post -op Vital signs reviewed and stable  Post vital signs: Reviewed and stable  Last Vitals:  Vitals Value Taken Time  BP 145/85 02/21/23 1200  Temp    Pulse 99 02/21/23 1202  Resp 23 02/21/23 1202  SpO2 100 % 02/21/23 1202  Vitals shown include unvalidated device data.  Last Pain:  Vitals:   02/21/23 0855  TempSrc:   PainSc: 8          Complications: No notable events documented.

## 2023-02-21 NOTE — Op Note (Signed)
02/21/2023  11:51 AM  PATIENT:  Katrina Wolfe    PRE-OPERATIVE DIAGNOSIS: Nonunion Charcot collapse internal fixation of the left ankle.  POST-OPERATIVE DIAGNOSIS:  Same  PROCEDURE:  REVISION LEFT ANKLE FUSION Takedown of the nonunion and a valgus osteotomy of the tibia to correct the varus Charcot deformity. Removal of deep retained hardware. Application of 10 cc stay graft. C-arm fluoroscopy to verify reduction.  SURGEON:  Newt Minion, MD  PHYSICIAN ASSISTANT:None ANESTHESIA:   General  PREOPERATIVE INDICATIONS:  Sondi Gribbon is a  57 y.o. female with a diagnosis of Failure Internal Fixation Left Ankle who failed conservative measures and elected for surgical management.    The risks benefits and alternatives were discussed with the patient preoperatively including but not limited to the risks of infection, bleeding, nerve injury, cardiopulmonary complications, the need for revision surgery, among others, and the patient was willing to proceed.  OPERATIVE IMPLANTS: Anterior fusion plate with 10 cc of stay graft.  '@ENCIMAGES'$ @  OPERATIVE FINDINGS: No fluid no abscess no signs of deep infection.  Serum fluoroscopy verified reduction of the ankle fusion AP and lateral planes.  OPERATIVE PROCEDURE: Patient brought the operating room after undergoing a regional anesthetic.  After adequate levels anesthesia were obtained patient's left lower extremity was prepped using DuraPrep draped into a sterile field a timeout was called.  Her previous anterior incision was used this was carried down to the anterior tibial tendon the anterior tibial tendon sheath was incised and dissection was carried down directly beneath the anterior tibial tendon to avoid the anterior tibial artery and deep peroneal nerve.  Dissection was carried down to bone and subperiosteal dissection was used to identify the joint.  Retractors were placed to protect the soft tissue tendons and vascular bundle.  Patient had a  varus collapse and underwent a valgus osteotomy of the tibia to correct the varus deformity.  The deep screw within bone was removed.  There is fibrinous tissue around the screw but no abscess no signs of infection no fluid.  After correction of the deformity the anterior plate was applied secured with locking screws distally and compression screws proximally.  C-arm possibly verified alignment.  The bone void was then filled with stay graft putty 10 cc.  The wound was irrigated throughout the surgery and the incision was closed using 2-0 nylon a sterile dressing was applied patient was taken the PACU in stable condition.   DISCHARGE PLANNING:  Antibiotic duration: 24 hours of antibiotics  Weightbearing: Touchdown weightbearing on the left  Pain medication: Opioid pathway  Dressing care/ Wound VAC: Dry dressing  Ambulatory devices: Walker  Discharge to: Discharge planning based on therapy recommendations.  Follow-up: In the office 1 week post operative.

## 2023-02-21 NOTE — H&P (Signed)
Katrina Wolfe is an 57 y.o. female.   Chief Complaint: Charcot collapse left ankle fusion. HPI: Patient is a 57 year old woman who was seen in for left ankle pain and deformity. Patient states she has recently had increased pain and increased deformity. She is status post ankle fusion December 2022 and had removal of the hardware approximately 6 months ago in August 2023. Patient was recently seen in the office showing loosening around the retained intramedullary screw. Patient states she just started developing drainage on the plantar aspect of her heel.   Past Medical History:  Diagnosis Date   Abnormal uterine bleeding (AUB)    Arthritis    knees, fingers   Asthma    as child, no problem since 1995   CKD (chronic kidney disease), stage II    Congenital cardiomegaly    per pt has always been told since childhood and siblings    Diabetes mellitus without complication ([place])    Elevated liver function tests    GERD (gastroesophageal reflux disease)    History of genital warts    HIV (human immunodeficiency virus infection) ([place])    DX 1998--  MONITORED BY INFECTIOUS DISEASE-- DR Carlyle Basques   Hypercholesterolemia    Hypertension    Intermittent palpitations    mild-- no meds   Legally blind in left eye, as defined in [place]    trauma as child   Peripheral vascular disease ([place])    Pneumonia    Renal disorder    Tuberculosis    ? 2005 or 2007   Type 2 diabetes mellitus ([place])    Type II    Uterine fibroid    VIN III (vulvar intraepithelial neoplasia III)    and Verrucoid lesion on the mons   Wears glasses     Past Surgical History:  Procedure Laterality Date   CESAREAN SECTION  2001  &  [id number]   CO2 LASER APPLICATION N/A 0000000   Procedure: CO2 LASER APPLICATION AND WIDE VOCAL EXCSION OF LESION ON MONS PUBIS;  Surgeon: Marti Sleigh, MD;  Location: [facility];  Service: Gynecology;  Laterality: N/A;   CASE CANCELLED    CO2 LASER APPLICATION N/A  123456   Procedure: CO2 LASER OF VULVAR;  Surgeon: Marti Sleigh, MD;  Location: [facility];  Service: Gynecology;  Laterality: N/A;   CORNEAL TRANSPLANT Left 12/18/2004   failed   D & C HYSTEROSCOPY /  RESECTION FIBROID  12/18/2002   DILATATION & CURETTAGE/HYSTEROSCOPY WITH MYOSURE N/A 10/01/2019   Procedure: DILATATION & CURETTAGE/HYSTEROSCOPY WITH MYOSURE and HYDROTHERMAL ABLATION;  Surgeon: Thurnell Lose, MD;  Location: [facility];  Service: Gynecology;  Laterality: N/A;  HTA rep will be here. Confirmed on 09/25/19 CS   EYE SURGERY     FOOT ARTHRODESIS Left 11/30/2021   Procedure: LEFT TIBIOCALCANEAL FUSION;  Surgeon: Newt Minion, MD;  Location: [facility];  Service: Orthopedics;  Laterality: Left;   HARDWARE REMOVAL Left 04/04/2019   Procedure: EXCISION BONE AND REMOVE DEEP HARDWARE LEFT 5TH METATARSAL;  Surgeon: Newt Minion, MD;  Location: [facility];  Service: Orthopedics;  Laterality: Left;   HARDWARE REMOVAL Left 08/04/2022   Procedure: REMOVAL DEEP HARDWARE LEFT ANKLE;  Surgeon: Newt Minion, MD;  Location: [facility];  Service: Orthopedics;  Laterality: Left;   I & D EXTREMITY Left 06/27/2021   Procedure: IRRIGATION AND DEBRIDEMENT FOOT ULCER;  Surgeon: Jessy Oto, MD;  Location: [facility];  Service: Orthopedics;  Laterality: Left;  KNEE ARTHROSCOPY W/ ACL RECONSTRUCTION Bilateral 12/18/2006   ORIF TOE FRACTURE Left 08/02/2017   Procedure: OPEN REDUCTION INTERNAL FIXATION (ORIF) FIFTH METATARSAL (TOE) BASE FRACTURE;  Surgeon: Wylene Simmer, MD;  Location: [facility];  Service: Orthopedics;  Laterality: Left;   VULVECTOMY N/A 01/07/2016   Procedure: WIDE LOCAL EXCISION;  Surgeon: Marti Sleigh, MD;  Location: [facility];  Service: Gynecology;  Laterality: N/A;  mons pubis as site    Family History  Problem Relation Age of Onset   Hypertension Mother    Diabetes Father    Cancer Brother    Breast cancer  Cousin    Social History:  reports that she has never smoked. She has never used smokeless tobacco. She reports that she does not drink alcohol and does not use drugs.  Allergies:  Allergies  Allergen Reactions   Shellfish Allergy Anaphylaxis    All types shellfish   Bactrim [Sulfamethoxazole-Trimethoprim] Hives    Medications Prior to Admission  Medication Sig Dispense Refill   acetaminophen (TYLENOL) 500 MG tablet Take 1,000 mg by mouth every 6 (six) hours as needed for moderate pain.     amLODipine (NORVASC) 5 MG tablet Take 5 mg by mouth in the morning.     aspirin EC 81 MG tablet Take 81 mg by mouth daily. Swallow whole.     calcium carbonate (TUMS - DOSED IN MG ELEMENTAL CALCIUM) 500 MG chewable tablet Chew 1 tablet by mouth as needed for indigestion or heartburn.     Calcium Carbonate-Vitamin D 600-200 MG-UNIT TABS Take 1 tablet by mouth daily.     diphenhydrAMINE (BENADRYL) 25 MG tablet Take 25 mg by mouth every 6 (six) hours as needed for allergies (or allergic reactions).     dolutegravir-lamiVUDine (DOVATO) 50-300 MG tablet TAKE ONE TABLET BY MOUTH EVERYDAY AT BEDTIME 30 tablet 1   doxycycline (VIBRA-TABS) 100 MG tablet Take 1 tablet (100 mg total) by mouth 2 (two) times daily. 60 tablet 0   Dulaglutide (TRULICITY) 4.5 0000000 SOPN Inject 4.5 mg into the skin every Friday.     gabapentin (NEURONTIN) 100 MG capsule Take 100 mg by mouth 4 (four) times daily.     glimepiride (AMARYL) 2 MG tablet Take 4 mg by mouth daily with breakfast.     HUMALOG KWIKPEN 100 UNIT/ML KwikPen Inject 10-15 Units into the skin daily with lunch.     HUMALOG MIX 75/25 KWIKPEN (75-25) 100 UNIT/ML Kwikpen Inject 60 Units into the skin 2 (two) times daily. Breakfast and Dinner     HYDROcodone-acetaminophen (NORCO/VICODIN) 5-325 MG tablet Take 1 tablet by mouth every 6 (six) hours as needed for moderate pain. 30 tablet 0   lisinopril-hydrochlorothiazide (ZESTORETIC) 20-25 MG tablet Take 1 tablet by mouth  daily.     norethindrone (AYGESTIN) 5 MG tablet Take 5 mg by mouth in the morning and at bedtime.     rosuvastatin (CRESTOR) 5 MG tablet Take 5 mg by mouth every morning.     traMADol (ULTRAM) 50 MG tablet Take 50 mg by mouth every 4 (four) hours as needed for moderate pain.     COMFORT EZ PEN NEEDLES 31G X 8 MM MISC USE WITH insulin THREE TIMES DAILY AS DIRECTED     Continuous Blood Gluc Receiver (DEXCOM G6 RECEIVER) DEVI as directed 30 days     Insulin Syringe-Needle U-100 (INSULIN SYRINGE 1CC/31GX5/16") 31G X 5/16" 1 ML MISC 1 Units by Does not apply route 2 (two) times daily. 30 each 0  Lancets (ONETOUCH ULTRASOFT) lancets      ONE TOUCH ULTRA TEST test strip       Results for orders placed or performed during the hospital encounter of 02/21/23 (from the past 48 hour(s))  Glucose, capillary     Status: None   Collection Time: 02/21/23  6:40 AM  Result Value Ref Range   Glucose-Capillary 87 70 - 99 mg/dL    Comment: Glucose reference range applies only to samples taken after fasting for at least 8 hours.   No results found.  Review of Systems  All other systems reviewed and are negative.   Blood pressure (!) 142/88, pulse 100, temperature 98.4 F (36.9 C), temperature source Oral, resp. rate 20, height '5\' 9"'$  (1.753 m), weight 119.7 kg, last menstrual period 04/27/2021, SpO2 96 %. Physical Exam  Patient is alert, oriented, no adenopathy, well-dressed, normal affect, normal respiratory effort. Examination patient has a palpable dorsalis pedis pulse there is no swelling around the ankle no cellulitis.  She has progressive varus deformity of the ankle with weightbearing on the lateral side of her foot.  Review of the radiographs shows lucency around the intramedullary screw consistent with either infection or loosening from the Charcot arthropathy.  Patient has a ulcer over the plantar aspect of her foot in the area where the hardware is now prominent.  There is clear drainage that may be  consistent with the screw loosening or possible infection. Assessment/Plan 1. H/O ankle fusion   2. Pain from implanted hardware, subsequent encounter   3. Charcot's arthropathy associated with type 2 diabetes mellitus ([place])       Plan: With the lucency around the screw and progressive deformity patient is having progressive Charcot collapse of the ankle.  We will start her on doxycycline today plan for revision anterior fusion.  Discussed that if patient has developed a deep infection she may require an amputation.  Patient has been actively treating the Charcot collapse of the left ankle for 4 years.  Newt Minion, MD 02/21/2023, 7:00 AM

## 2023-02-22 ENCOUNTER — Telehealth: Payer: Self-pay | Admitting: Orthopedic Surgery

## 2023-02-22 DIAGNOSIS — J45909 Unspecified asthma, uncomplicated: Secondary | ICD-10-CM | POA: Diagnosis not present

## 2023-02-22 DIAGNOSIS — I129 Hypertensive chronic kidney disease with stage 1 through stage 4 chronic kidney disease, or unspecified chronic kidney disease: Secondary | ICD-10-CM | POA: Diagnosis not present

## 2023-02-22 DIAGNOSIS — E1122 Type 2 diabetes mellitus with diabetic chronic kidney disease: Secondary | ICD-10-CM | POA: Diagnosis not present

## 2023-02-22 DIAGNOSIS — Z7985 Long-term (current) use of injectable non-insulin antidiabetic drugs: Secondary | ICD-10-CM | POA: Diagnosis not present

## 2023-02-22 DIAGNOSIS — Z794 Long term (current) use of insulin: Secondary | ICD-10-CM | POA: Diagnosis not present

## 2023-02-22 DIAGNOSIS — N182 Chronic kidney disease, stage 2 (mild): Secondary | ICD-10-CM | POA: Diagnosis not present

## 2023-02-22 DIAGNOSIS — Z7982 Long term (current) use of aspirin: Secondary | ICD-10-CM | POA: Diagnosis not present

## 2023-02-22 DIAGNOSIS — M14672 Charcot's joint, left ankle and foot: Secondary | ICD-10-CM | POA: Diagnosis not present

## 2023-02-22 DIAGNOSIS — Z79899 Other long term (current) drug therapy: Secondary | ICD-10-CM | POA: Diagnosis not present

## 2023-02-22 LAB — GLUCOSE, CAPILLARY
Glucose-Capillary: 178 mg/dL — ABNORMAL HIGH (ref 70–99)
Glucose-Capillary: 284 mg/dL — ABNORMAL HIGH (ref 70–99)

## 2023-02-22 MED ORDER — OXYCODONE-ACETAMINOPHEN 5-325 MG PO TABS: 1.0000 | ORAL_TABLET | ORAL | 0 refills | Status: DC | PRN

## 2023-02-22 NOTE — Discharge Summary (Signed)
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 female who was admitted 02/21/2023 with a chief complaint of Charcot collapse of the ankle fusion, with a final diagnosis of Failure Internal Fixation Left Ankle.  Patient was brought to the operating room on 02/21/2023 and underwent Procedure(s): REVISION LEFT ANKLE FUSION.    Patient was given perioperative antibiotics:  Anti-infectives (From admission, onward)    Start     Dose/Rate Route Frequency Ordered Stop   02/21/23 1600  ceFAZolin (ANCEF) IVPB 2g/100 mL premix        2 g 200 mL/hr over 30 Minutes Intravenous Every 6 hours 02/21/23 1407 02/22/23 0530   02/21/23 1500  dolutegravir-lamiVUDine (DOVATO) 50-300 MG per tablet 1 tablet        1 tablet Oral Daily 02/21/23 1407     02/21/23 0830  ceFAZolin (ANCEF) IVPB 2g/100 mL premix        2 g 200 mL/hr over 30 Minutes Intravenous On call to O.R. 02/21/23 [id number] 02/21/23 1054     .  Patient was given sequential compression devices, early ambulation, and aspirin for DVT prophylaxis.  Recent vital signs: Patient Vitals for the past 24 hrs:  BP Temp Temp src Pulse Resp SpO2  02/22/23 0527 110/77 99 F (37.2 C) Oral 95 18 96 %  02/21/23 2100 128/81 98.9 F (37.2 C) Oral (!) 104 20 98 %  02/21/23 1419 127/80 98.1 F (36.7 C) -- 99 18 98 %  02/21/23 1357 134/74 98.4 F (36.9 C) -- 95 17 100 %  02/21/23 1330 138/89 -- -- (!) 102 (!) 22 100 %  02/21/23 1302 134/86 -- -- 95 19 100 %  02/21/23 1300 -- 98.2 F (36.8 C) -- -- -- --  02/21/23 1245 132/81 -- -- 91 (!) 21 100 %  02/21/23 1230 131/85 -- -- 91 (!) 21 100 %  02/21/23 1215 130/83 -- -- 90 (!) 22 100 %  02/21/23 1200 (!) 145/85 97.7 F (36.5 C) -- 98 (!) 24 100 %  02/21/23 0950 124/76 -- -- 97 (!) 26  97 %  02/21/23 0945 125/68 -- -- 98 20 99 %  02/21/23 0940 119/79 -- -- 99 20 97 %  02/21/23 0935 125/72 -- -- 93 (!) 23 96 %  .  Recent laboratory studies: DG MINI C-ARM IMAGE ONLY  Result Date: 02/21/2023 There is no interpretation for this exam.  This order is for images obtained during a surgical procedure.  Please See "Surgeries" Tab for more information regarding the procedure.    Discharge Medications:   Allergies as of 02/22/2023       Reactions   Shellfish Allergy Anaphylaxis   All types shellfish   Bactrim [sulfamethoxazole-trimethoprim] Hives        Medication List     STOP taking these medications    HYDROcodone-acetaminophen 5-325 MG tablet Commonly known as: NORCO/VICODIN   traMADol 50 MG tablet Commonly known as: ULTRAM       TAKE these medications    acetaminophen 500 MG tablet Commonly known as: TYLENOL Take 1,000 mg by mouth every 6 (six) hours as needed for moderate pain.   amLODipine 5 MG tablet Commonly known as: NORVASC Take 5 mg by mouth in the morning.   aspirin EC 81 MG tablet  Take 81 mg by mouth daily. Swallow whole.   calcium carbonate 500 MG chewable tablet Commonly known as: TUMS - dosed in mg elemental calcium Chew 1 tablet by mouth as needed for indigestion or heartburn.   Calcium Carbonate-Vitamin D 600-200 MG-UNIT Tabs Take 1 tablet by mouth daily.   Comfort EZ Pen Needles 31G X 8 MM Misc Generic drug: Insulin Pen Needle USE WITH insulin THREE TIMES DAILY AS DIRECTED   Dexcom G6 Receiver Devi as directed 30 days   diphenhydrAMINE 25 MG tablet Commonly known as: BENADRYL Take 25 mg by mouth every 6 (six) hours as needed for allergies (or allergic reactions).   Dovato 50-300 MG tablet Generic drug: dolutegravir-lamiVUDine TAKE ONE TABLET BY MOUTH EVERYDAY AT BEDTIME   doxycycline 100 MG tablet Commonly known as: VIBRA-TABS Take 1 tablet (100 mg total) by mouth 2 (two) times daily.   gabapentin 100 MG  capsule Commonly known as: NEURONTIN Take 100 mg by mouth 4 (four) times daily.   glimepiride 2 MG tablet Commonly known as: AMARYL Take 4 mg by mouth daily with breakfast.   HumaLOG KwikPen 100 UNIT/ML KwikPen Generic drug: insulin lispro Inject 10-15 Units into the skin daily with lunch.   HumaLOG Mix 75/25 KwikPen (75-25) 100 UNIT/ML Kwikpen Generic drug: Insulin Lispro Prot & Lispro Inject 60 Units into the skin 2 (two) times daily. Breakfast and Dinner   INSULIN SYRINGE 1CC/31GX5/16" 31G X 5/16" 1 ML Misc 1 Units by Does not apply route 2 (two) times daily.   lisinopril-hydrochlorothiazide 20-25 MG tablet Commonly known as: ZESTORETIC Take 1 tablet by mouth daily.   norethindrone 5 MG tablet Commonly known as: AYGESTIN Take 5 mg by mouth in the morning and at bedtime.   ONE TOUCH ULTRA TEST test strip Generic drug: glucose blood   onetouch ultrasoft lancets   oxyCODONE-acetaminophen 5-325 MG tablet Commonly known as: PERCOCET/ROXICET Take 1 tablet by mouth every 4 (four) hours as needed.   rosuvastatin 5 MG tablet Commonly known as: CRESTOR Take 5 mg by mouth every morning.   Trulicity 4.5 0000000 Sopn Generic drug: Dulaglutide Inject 4.5 mg into the skin every Friday.               Discharge Care Instructions  (From admission, onward)           Start     Ordered   02/22/23 0000  Touch down weight bearing       Question Answer Comment  Laterality left   Extremity Lower      02/22/23 0807            Diagnostic Studies: DG MINI C-ARM IMAGE ONLY  Result Date: 02/21/2023 There is no interpretation for this exam.  This order is for images obtained during a surgical procedure.  Please See "Surgeries" Tab for more information regarding the procedure.    Patient benefited maximally from their hospital stay and there were no complications.     Disposition: Discharge disposition: 01-Home or Self Care      Discharge Instructions      Apply cam walker   Complete by: As directed    Laterality: Left   Call MD / Call 911   Complete by: As directed    If you experience chest pain or shortness of breath, CALL 911 and be transported to the hospital emergency room.  If you develope a fever above 101 F, pus (white drainage) or increased drainage or redness at the wound, or calf pain, call  your surgeon's office.   Constipation Prevention   Complete by: As directed    Drink plenty of fluids.  Prune juice may be helpful.  You may use a stool softener, such as Colace (over the counter) 100 mg twice a day.  Use MiraLax (over the counter) for constipation as needed.   Diet - low sodium heart healthy   Complete by: As directed    Increase activity slowly as tolerated   Complete by: As directed    Post-operative opioid taper instructions:   Complete by: As directed    POST-OPERATIVE OPIOID TAPER INSTRUCTIONS: It is important to wean off of your opioid medication as soon as possible. If you do not need pain medication after your surgery it is ok to stop day one. Opioids include: Codeine, Hydrocodone(Norco, Vicodin), Oxycodone(Percocet, oxycontin) and hydromorphone amongst others.  Long term and even short term use of opiods can cause: Increased pain response Dependence Constipation Depression Respiratory depression And more.  Withdrawal symptoms can include Flu like symptoms Nausea, vomiting And more Techniques to manage these symptoms Hydrate well Eat regular healthy meals Stay active Use relaxation techniques(deep breathing, meditating, yoga) Do Not substitute Alcohol to help with tapering If you have been on opioids for less than two weeks and do not have pain than it is ok to stop all together.  Plan to wean off of opioids This plan should start within one week post op of your joint replacement. Maintain the same interval or time between taking each dose and first decrease the dose.  Cut the total daily intake of  opioids by one tablet each day Next start to increase the time between doses. The last dose that should be eliminated is the evening dose.      Touch down weight bearing   Complete by: As directed    Laterality: left   Extremity: Lower       Follow-up Information     Newt Minion, MD Follow up in 1 week(s).   Specialty: Orthopedic Surgery Contact information: [address] [place] [address] [phone]                  Signed: Newt Minion 02/22/2023, 8:07 AM

## 2023-02-22 NOTE — Evaluation (Addendum)
Physical Therapy Evaluation and Discharge Patient Details Name: Katrina Wolfe MRN: [id number] DOB: 01-Sep-1966 Today's Date: 02/22/2023  History of Present Illness  57 y.o. female who was admitted 02/21/2023 with a chief complaint of Charcot collapse of the ankle fusion, with a final diagnosis of Failure Internal Fixation Left Ankle.  Patient was brought to the operating room on 02/21/2023 and underwent  Lt ankle fusion revision.  Clinical Impression  Patient evaluated by Physical Therapy with no further acute PT needs identified. Very pleasant. All education has been completed and the patient has no further questions. Patient reports feeling back to baseline with mobility. Able to squat pivot transfer to her w/c and back to bed with assist to block her W/c from moving (Lt break does not engage). Would greatly benefit from new w/c as it is in a state of disrepair; discussed further resources if insurance will not cover necessary equipment needs. Reviewed exercises for LEs. Agreeable to OPPT once cleared by surgeon. Seems to have deficient Rt patellar tendon, hx of surgery, and ongoing weakness. Has good family support at home. They assist her up and down a couple of steps to get in/out of home with the wheelchair. Describes proper technique and declines need for practice. All questions answered. See below for any follow-up Physical Therapy or equipment needs. PT is signing off. Thank you for this referral.        Recommendations for follow up therapy are one component of a multi-disciplinary discharge planning process, led by the attending physician.  Recommendations may be updated based on patient status, additional functional criteria and insurance authorization.  Follow Up Recommendations Outpatient PT (Once cleared by surgeon - preferrably once she is WBAT again for progression of gait.)      Assistance Recommended at Discharge Intermittent Supervision/Assistance  Patient can return home with the  following  A little help with walking and/or transfers;A little help with bathing/dressing/bathroom;Assistance with cooking/housework;Help with stairs or ramp for entrance;Assist for transportation    Equipment Recommendations Wheelchair (measurements PT);Wheelchair cushion (measurements PT) (Current wheelchair in disrepair.)  Recommendations for Other Services       Functional Status Assessment Patient has had a recent decline in their functional status and demonstrates the ability to make significant improvements in function in a reasonable and predictable amount of time.     Precautions / Restrictions Precautions Precautions: Fall Required Braces or Orthoses: Other Brace Other Brace: CAM boot Restrictions Weight Bearing Restrictions: Yes LLE Weight Bearing: Touchdown weight bearing Other Position/Activity Restrictions: CAM boot      Mobility  Bed Mobility Overal bed mobility: Modified Independent                  Transfers Overall transfer level: Needs assistance Equipment used: None Transfers: Bed to chair/wheelchair/BSC       Squat pivot transfers: Min assist     General transfer comment: Min assist to block patient's wheelchair. Able to safely squat pivot to w/c and back to bed without physical assistance. Maintains TDWB through LLE with CAM boot on. Pt reports feels baseline.    Ambulation/Gait                  Hotel manager mobility:  (Declines)  Modified Rankin (Stroke Patients Only)       Balance Overall balance assessment: Needs assistance Sitting-balance support: No upper extremity supported, Feet supported Sitting balance-Leahy Scale: Normal  Pertinent Vitals/Pain Pain Assessment Pain Assessment: 0-10 Pain Score: 0-No pain Pain Intervention(s): Monitored during session    Home Living Family/patient expects to be  discharged to:: Private residence Living Arrangements: Children;Other relatives Available Help at Discharge: Family;Available 24 hours/day Type of Home: House Home Access: Stairs to enter Entrance Stairs-Rails: None Entrance Stairs-Number of Steps: 2-3   Home Layout: One level Home Equipment: Conservation officer, nature (2 wheels);Wheelchair - manual;BSC/3in1      Prior Function Prior Level of Function : Independent/Modified Independent             Mobility Comments: stand pivot or scoot transfers to w/c. Has not walked in quite some time (unsure). ADLs Comments: ind. slides down into her bath tub, but has a 3-in-1 per pt report.     Hand Dominance   Dominant Hand: Right    Extremity/Trunk Assessment   Upper Extremity Assessment Upper Extremity Assessment: Defer to OT evaluation    Lower Extremity Assessment Lower Extremity Assessment: LLE deficits/detail;RLE deficits/detail RLE Deficits / Details: Seems to have somewhat of a deficient patellar ligament. Reports hx of injury and surgical procedure. Significant SLR lag. flexion contracture of knee LLE Deficits / Details: heavily bandaged. Some old bloody drainage around bandaging. notes indicate change prior to d/c. LLE: Unable to fully assess due to immobilization (flexion contracture of knee)       Communication   Communication: No difficulties  Cognition Arousal/Alertness: Awake/alert Behavior During Therapy: WFL for tasks assessed/performed Overall Cognitive Status: Within Functional Limits for tasks assessed                                          General Comments General comments (skin integrity, edema, etc.): CAM boot applied, discussed use, precautions, and LE exercises.    Exercises General Exercises - Lower Extremity Quad Sets: Strengthening, Both, 10 reps, Supine Gluteal Sets: Strengthening, Both, 10 reps, Seated Long Arc Quad: Strengthening, Both, 10 reps, Seated Hip ABduction/ADduction:  Strengthening, Both, 10 reps, Seated Straight Leg Raises: Strengthening, Both, 5 reps, Supine   Assessment/Plan    PT Assessment Patient does not need any further PT services  PT Problem List Decreased strength;Decreased range of motion;Decreased activity tolerance;Decreased balance;Decreased mobility;Decreased knowledge of use of DME;Decreased knowledge of precautions;Obesity       PT Treatment Interventions DME instruction;Functional mobility training;Therapeutic activities;Therapeutic exercise;Patient/family education    PT Goals (Current goals can be found in the Care Plan section)  Acute Rehab PT Goals Patient Stated Goal: Go home PT Goal Formulation: All assessment and education complete, DC therapy    Frequency       Co-evaluation               AM-PAC PT "6 Clicks" Mobility  Outcome Measure Help needed turning from your back to your side while in a flat bed without using bedrails?: None Help needed moving from lying on your back to sitting on the side of a flat bed without using bedrails?: None Help needed moving to and from a bed to a chair (including a wheelchair)?: A Little Help needed standing up from a chair using your arms (e.g., wheelchair or bedside chair)?: A Lot Help needed to walk in hospital room?: Total Help needed climbing 3-5 steps with a railing? : Total 6 Click Score: 15    End of Session   Activity Tolerance: Patient tolerated treatment well Patient left: in bed;with call bell/phone  within reach;with bed alarm set Nurse Communication: Mobility status PT Visit Diagnosis: Muscle weakness (generalized) (M62.81);Difficulty in walking, not elsewhere classified (R26.2)    Time: [id number] PT Time Calculation (min) (ACUTE ONLY): 30 min   Charges:   PT Evaluation $PT Eval Low Complexity: 1 Low PT Treatments $Therapeutic Activity: 8-22 mins        Candie Mile, PT, DPT Physical Therapist Acute Rehabilitation Services [place]   Ellouise Newer 02/22/2023, 9:39 AM

## 2023-02-22 NOTE — Care Management (Signed)
    Durable Medical Equipment  (From admission, onward)           Start     Ordered   02/22/23 0947  For home use only DME standard manual wheelchair with seat cushion  Once       Comments: Patient suffers from ankle surgery which impairs their ability to perform daily activities like toileting in the home.  A walker will not resolve issue with performing activities of daily living. A wheelchair will allow patient to safely perform daily activities. Patient can safely propel the wheelchair in the home or has a caregiver who can provide assistance. Length of need 12 months . Accessories: elevating leg rests (ELRs), wheel locks, extensions and anti-tippers.   02/22/23 [id number]

## 2023-02-22 NOTE — Care Management Obs Status (Signed)
[facility] NOTIFICATION   Patient Details  Name: Katrina Wolfe MRN: [id number] Date of Birth: 12-Aug-1966   Medicare Observation Status Notification Given:  Yes    Verdell Carmine, RN 02/22/2023, 11:26 AM

## 2023-02-22 NOTE — Care Management CC44 (Signed)
Condition Code 44 Documentation Completed  Patient Details  Name: Yaelis Pepi MRN: [id number] Date of Birth: 06-29-1966   Condition Code 44 given:  Yes Patient signature on Condition Code 44 notice:  Yes Documentation of 2 MD's agreement:  Yes Code 44 added to claim:  Yes    Verdell Carmine, RN 02/22/2023, 11:26 AM

## 2023-02-22 NOTE — Progress Notes (Signed)
Patient ID: Katrina Wolfe, female   DOB: [birth year], 57 y.o.   MRN: [id number] Patient is postoperative day 1 revision left ankle fusion.  There has been drainage through dressing.  Will have the dressing removed and a new dressing reapplied this morning patient states she feels safe for discharge to home.

## 2023-02-22 NOTE — TOC Transition Note (Addendum)
Transition of Care [facility]) - CM/SW Discharge Note   Patient Details  Name: Katrina Wolfe MRN: [id number] Date of Birth: [birth year]  [facility] CM/SW Contact:  Verdell Carmine, RN Phone Number: 02/22/2023, 9:55 AM   Clinical Narrative:     PT assessed and recommended, wheelchair, hers is  broken at home, as well as OP PT. Referral placed for OP PT, ordered wheelchair from [employer]. If insurance will not cover, PT has spoken to patient about alternative choices. RN and PT messaged regarding WC ordered.  DC after DME delivered.   Final next level of care: Home/Self Care Barriers to Discharge: No Barriers Identified   Patient Goals and CMS Choice      Discharge Placement                         Discharge Plan and Services Additional resources added to the After Visit Summary for                  DME Arranged: Wheelchair manual DME Agency: [employer] Date DME Agency Contacted: 02/22/23 Time DME Agency Contacted: [phone] Representative spoke with at DME Agency: Cyril Mourning            Social Determinants of Health ([place]) Interventions SDOH Screenings   Food Insecurity: No Food Insecurity (02/21/2023)  Housing: Medium Risk (02/21/2023)  Transportation Needs: No Transportation Needs (02/21/2023)  Utilities: At Risk (02/21/2023)  Depression (PHQ2-9): Low Risk  (10/17/2022)  Tobacco Use: Low Risk  (02/21/2023)     Readmission Risk Interventions     No data to display

## 2023-02-22 NOTE — Telephone Encounter (Signed)
Patient nurse Desma Maxim called in from hospital stating the patient is being discharged today and needs instructions please advise patient is ready to leave

## 2023-02-23 ENCOUNTER — Encounter (HOSPITAL_COMMUNITY): Payer: Self-pay | Admitting: Orthopedic Surgery

## 2023-03-02 ENCOUNTER — Other Ambulatory Visit (INDEPENDENT_AMBULATORY_CARE_PROVIDER_SITE_OTHER): Payer: 59

## 2023-03-02 ENCOUNTER — Encounter: Payer: Self-pay | Admitting: Family

## 2023-03-02 ENCOUNTER — Ambulatory Visit (INDEPENDENT_AMBULATORY_CARE_PROVIDER_SITE_OTHER): Payer: 59 | Admitting: Family

## 2023-03-02 DIAGNOSIS — Z981 Arthrodesis status: Secondary | ICD-10-CM | POA: Diagnosis not present

## 2023-03-02 NOTE — Progress Notes (Signed)
Post-Op Visit Note   Patient: Tanisia Wolfe           Date of Birth: 1966/06/11           MRN: [id number] Visit Date: 03/02/2023 PCP: Wenda Low, MD  Chief Complaint:  Chief Complaint  Patient presents with   Left Ankle - Routine Post Op    02/21/23 left ankle fusion revision    HPI:  HPI The patient is a 57 year old woman who presents status post left ankle fusion revision in March 6 of this year.  Reports that she has not been weightbearing that she has been strictly nonweightbearing relates that a few days ago she heard a crunching and felt movement in her ankle.  She has been in a cam walker. Ortho Exam On examination of the left ankle she is able to flex and wiggle her foot and ankle some.  Crepitation with range of motion.  Incision well-approximated with sutures there is 1 drop of bloody drainage there is no concerning sign of the incision.  Visit Diagnoses: No diagnosis found.  Plan: Will place in a fiberglass short leg cast.  Follow-up in 10 days with Dr. Sharol Given.  Follow-Up Instructions: No follow-ups on file.   Imaging: No results found.  Orders:  No orders of the defined types were placed in this encounter.  No orders of the defined types were placed in this encounter.    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, with necrosis of bone ([place])    Diabetes mellitus type 2, uncontrolled, with complications [id number]   Diabetic nephropathy ([place]) 06/26/2021   CKD (chronic kidney disease), stage III ([place]) 06/26/2021   HTN (hypertension) 06/24/2021   HIV (human immunodeficiency virus infection) ([place]) 06/24/2021   DM2 (diabetes mellitus, type 2) ([place]) 06/24/2021   HLD (hyperlipidemia) 06/24/2021   Osteomyelitis of left foot ([place])  06/24/2021   Elevated LFTs 06/24/2021   Acute-on-chronic kidney injury ([place]) 06/24/2021   Osteomyelitis of foot ([place]) 123456   Hardware complicating wound infection ([place])    Subacute osteomyelitis, left ankle and foot ([place])    Open displaced fracture of fifth metatarsal bone of left foot    Healthcare maintenance 04/03/2019   HIV disease ([place]) 10/27/2015   DM type 2 (diabetes mellitus, type 2) ([place]) 10/27/2015   Hyperlipidemia 10/27/2015   Essential hypertension 10/27/2015   Severe vulvar dysplasia, histologically confirmed 10/15/2015   Past Medical History:  Diagnosis Date   Abnormal uterine bleeding (AUB)    Arthritis    knees, fingers   Asthma    as child, no problem since 1995   CKD (chronic kidney disease), stage II    Congenital cardiomegaly    per pt has always been told since childhood and siblings    Diabetes mellitus without complication ([place])    Elevated liver function tests    GERD (gastroesophageal reflux disease)    History of genital warts    HIV (human immunodeficiency virus infection) ([place])    DX 1998--  MONITORED BY INFECTIOUS DISEASE-- DR Carlyle Basques   Hypercholesterolemia    Hypertension    Intermittent palpitations    mild-- no meds   Legally blind in left eye, as defined in [place]    trauma as child   Peripheral vascular  disease ([place])    Pneumonia    Renal disorder    Tuberculosis    ? 2005 or 2007   Type 2 diabetes mellitus ([place])    Type II    Uterine fibroid    VIN III (vulvar intraepithelial neoplasia III)    and Verrucoid lesion on the mons   Wears glasses     Family History  Problem Relation Age of Onset   Hypertension Mother    Diabetes Father    Cancer Brother    Breast cancer Cousin     Past Surgical History:  Procedure Laterality Date   ANKLE FUSION Left 02/21/2023   Procedure: REVISION LEFT ANKLE FUSION;  Surgeon: Newt Minion, MD;  Location: [place];  Service: Orthopedics;  Laterality: Left;   CESAREAN SECTION  2001  &   Q000111Q   CO2 LASER APPLICATION N/A 0000000   Procedure: CO2 LASER APPLICATION AND WIDE VOCAL EXCSION OF LESION ON MONS PUBIS;  Surgeon: Marti Sleigh, MD;  Location: [place];  Service: Gynecology;  Laterality: N/A;   CASE CANCELLED    CO2 LASER APPLICATION N/A 123456   Procedure: CO2 LASER OF VULVAR;  Surgeon: Marti Sleigh, MD;  Location: [facility];  Service: Gynecology;  Laterality: N/A;   CORNEAL TRANSPLANT Left 12/18/2004   failed   D & C HYSTEROSCOPY /  RESECTION FIBROID  12/18/2002   DILATATION & CURETTAGE/HYSTEROSCOPY WITH MYOSURE N/A 10/01/2019   Procedure: DILATATION & CURETTAGE/HYSTEROSCOPY WITH MYOSURE and HYDROTHERMAL ABLATION;  Surgeon: Thurnell Lose, MD;  Location: [place];  Service: Gynecology;  Laterality: N/A;  HTA rep will be here. Confirmed on 09/25/19 CS   EYE SURGERY     FOOT ARTHRODESIS Left 11/30/2021   Procedure: LEFT TIBIOCALCANEAL FUSION;  Surgeon: Newt Minion, MD;  Location: [place];  Service: Orthopedics;  Laterality: Left;   HARDWARE REMOVAL Left 04/04/2019   Procedure: EXCISION BONE AND REMOVE DEEP HARDWARE LEFT 5TH METATARSAL;  Surgeon: Newt Minion, MD;  Location: [place];  Service: Orthopedics;  Laterality: Left;   HARDWARE REMOVAL Left 08/04/2022   Procedure: REMOVAL DEEP HARDWARE LEFT ANKLE;  Surgeon: Newt Minion, MD;  Location: [place];  Service: Orthopedics;  Laterality: Left;   I & D EXTREMITY Left 06/27/2021   Procedure: IRRIGATION AND DEBRIDEMENT FOOT ULCER;  Surgeon: Jessy Oto, MD;  Location: [place];  Service: Orthopedics;  Laterality: Left;   KNEE ARTHROSCOPY W/ ACL RECONSTRUCTION Bilateral 12/18/2006   ORIF TOE FRACTURE Left 08/02/2017   Procedure: OPEN REDUCTION INTERNAL FIXATION (ORIF) FIFTH METATARSAL (TOE) BASE FRACTURE;  Surgeon: Wylene Simmer, MD;  Location: [place];  Service: Orthopedics;  Laterality: Left;   VULVECTOMY N/A 01/07/2016   Procedure:  WIDE LOCAL EXCISION;  Surgeon: Marti Sleigh, MD;  Location: [facility];  Service: Gynecology;  Laterality: N/A;  mons pubis as site   Social History   Occupational History   Not on file  Tobacco Use   Smoking status: Never   Smokeless tobacco: Never  Vaping Use   Vaping Use: Never used  Substance and Sexual Activity   Alcohol use: No    Alcohol/week: 0.0 standard drinks of alcohol   Drug use: No   Sexual activity: Yes    Partners: Male    Birth control/protection: Condom, Post-menopausal

## 2023-03-14 ENCOUNTER — Telehealth: Payer: Self-pay

## 2023-03-14 NOTE — Telephone Encounter (Signed)
I called again and no answer, was in que for someone to answer. Will try again tomorrow.

## 2023-03-14 NOTE — Telephone Encounter (Signed)
Levada Dy with [facility] on triage phone wanting to know if pt still needs the doxycycline. She would like a cb to discuss @ [phone]

## 2023-03-14 NOTE — Telephone Encounter (Signed)
Please see below and advise.

## 2023-03-14 NOTE — Telephone Encounter (Signed)
Yes! She didn't get it yet? I tried calling myself and it sent me to VM

## 2023-03-15 NOTE — Telephone Encounter (Signed)
Called again this morning, was in que again to wait for pharmacy tech to answer. No one did after several minutes, and at that time it sent me automatically to a VM line I which I did leave details on that pt should take her doxycycline.

## 2023-03-20 ENCOUNTER — Ambulatory Visit (INDEPENDENT_AMBULATORY_CARE_PROVIDER_SITE_OTHER): Payer: 59 | Admitting: Family

## 2023-03-20 ENCOUNTER — Encounter: Payer: Self-pay | Admitting: Family

## 2023-03-20 DIAGNOSIS — Z981 Arthrodesis status: Secondary | ICD-10-CM

## 2023-03-20 NOTE — Progress Notes (Signed)
Post-Op Visit Note   Patient: Katrina Wolfe           Date of Birth: 17-Nov-1966           MRN: [id number] Visit Date: 03/20/2023 PCP: Wenda Low, MD  Chief Complaint:  Chief Complaint  Patient presents with   Left Ankle - Routine Post Op    02/21/2023 revision left ankle fusion     HPI:  HPI The patient is a 57 year old woman who presents status post left ankle fusion revision in March 6 of this year.    Has been in a short leg cast for the last 10 days.  Ortho Exam On examination of the left ankle her incision is well-healed this is clean and dry sutures harvested today without incident.  Minimal edema no erythema Visit Diagnoses: No diagnosis found.  Plan: Will place in a fiberglass short leg cast. follow-up in 3 weeks with repeat radiographs.  Follow-Up Instructions: No follow-ups on file.   Imaging: No results found.  Orders:  No orders of the defined types were placed in this encounter.  No orders of the defined types were placed in this encounter.    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, with necrosis of bone    Diabetes mellitus type 2, uncontrolled, with complications [id number]   Diabetic nephropathy 06/26/2021   CKD (chronic kidney disease), stage III 06/26/2021   HTN (hypertension) 06/24/2021   HIV (human immunodeficiency virus infection) 06/24/2021   DM2 (diabetes mellitus, type 2) 06/24/2021   HLD (hyperlipidemia) 06/24/2021   Osteomyelitis of left foot 06/24/2021   Elevated LFTs 06/24/2021   Acute-on-chronic kidney injury 06/24/2021   Osteomyelitis of foot 123456   Hardware complicating wound infection    Subacute osteomyelitis, left ankle and foot    Open displaced fracture of fifth metatarsal bone of left  foot    Healthcare maintenance 04/03/2019   HIV disease 10/27/2015   DM type 2 (diabetes mellitus, type 2) 10/27/2015   Hyperlipidemia 10/27/2015   Essential hypertension 10/27/2015   Severe vulvar dysplasia, histologically confirmed 10/15/2015   Past Medical History:  Diagnosis Date   Abnormal uterine bleeding (AUB)    Arthritis    knees, fingers   Asthma    as child, no problem since 1995   CKD (chronic kidney disease), stage II    Congenital cardiomegaly    per pt has always been told since childhood and siblings    Diabetes mellitus without complication ([name])    Elevated liver function tests    GERD (gastroesophageal reflux disease)    History of genital warts    HIV (human immunodeficiency virus infection) ([place])    DX 1998--  MONITORED BY INFECTIOUS DISEASE-- DR Carlyle Basques   Hypercholesterolemia    Hypertension    Intermittent palpitations    mild-- no meds   Legally blind in left eye, as defined in [place]    trauma as child   Peripheral vascular disease ([place])    Pneumonia    Renal disorder    Tuberculosis    ? 2005 or 2007   Type 2 diabetes mellitus ([place])    Type II    Uterine fibroid    VIN III (vulvar intraepithelial neoplasia III)    and Verrucoid lesion  on the mons   Wears glasses     Family History  Problem Relation Age of Onset   Hypertension Mother    Diabetes Father    Cancer Brother    Breast cancer Cousin     Past Surgical History:  Procedure Laterality Date   ANKLE FUSION Left 02/21/2023   Procedure: REVISION LEFT ANKLE FUSION;  Surgeon: Newt Minion, MD;  Location: [place];  Service: Orthopedics;  Laterality: Left;   CESAREAN SECTION  2001  &  Q000111Q   CO2 LASER APPLICATION N/A 0000000   Procedure: CO2 LASER APPLICATION AND WIDE VOCAL EXCSION OF LESION ON MONS PUBIS;  Surgeon: Marti Sleigh, MD;  Location: [place];  Service: Gynecology;  Laterality: N/A;   CASE CANCELLED    CO2 LASER APPLICATION N/A 123456    Procedure: CO2 LASER OF VULVAR;  Surgeon: Marti Sleigh, MD;  Location: [facility];  Service: Gynecology;  Laterality: N/A;   CORNEAL TRANSPLANT Left 12/18/2004   failed   D & C HYSTEROSCOPY /  RESECTION FIBROID  12/18/2002   DILATATION & CURETTAGE/HYSTEROSCOPY WITH MYOSURE N/A 10/01/2019   Procedure: DILATATION & CURETTAGE/HYSTEROSCOPY WITH MYOSURE and HYDROTHERMAL ABLATION;  Surgeon: Thurnell Lose, MD;  Location: [place];  Service: Gynecology;  Laterality: N/A;  HTA rep will be here. Confirmed on 09/25/19 CS   EYE SURGERY     FOOT ARTHRODESIS Left 11/30/2021   Procedure: LEFT TIBIOCALCANEAL FUSION;  Surgeon: Newt Minion, MD;  Location: [place];  Service: Orthopedics;  Laterality: Left;   HARDWARE REMOVAL Left 04/04/2019   Procedure: EXCISION BONE AND REMOVE DEEP HARDWARE LEFT 5TH METATARSAL;  Surgeon: Newt Minion, MD;  Location: [place];  Service: Orthopedics;  Laterality: Left;   HARDWARE REMOVAL Left 08/04/2022   Procedure: REMOVAL DEEP HARDWARE LEFT ANKLE;  Surgeon: Newt Minion, MD;  Location: [place];  Service: Orthopedics;  Laterality: Left;   I & D EXTREMITY Left 06/27/2021   Procedure: IRRIGATION AND DEBRIDEMENT FOOT ULCER;  Surgeon: Jessy Oto, MD;  Location: [place];  Service: Orthopedics;  Laterality: Left;   KNEE ARTHROSCOPY W/ ACL RECONSTRUCTION Bilateral 12/18/2006   ORIF TOE FRACTURE Left 08/02/2017   Procedure: OPEN REDUCTION INTERNAL FIXATION (ORIF) FIFTH METATARSAL (TOE) BASE FRACTURE;  Surgeon: Wylene Simmer, MD;  Location: [place];  Service: Orthopedics;  Laterality: Left;   VULVECTOMY N/A 01/07/2016   Procedure: WIDE LOCAL EXCISION;  Surgeon: Marti Sleigh, MD;  Location: [facility];  Service: Gynecology;  Laterality: N/A;  mons pubis as site   Social History   Occupational History   Not on file  Tobacco Use   Smoking status: Never   Smokeless tobacco: Never  Vaping Use   Vaping  Use: Never used  Substance and Sexual Activity   Alcohol use: No    Alcohol/week: 0.0 standard drinks of alcohol   Drug use: No   Sexual activity: Yes    Partners: Male    Birth control/protection: Condom, Post-menopausal

## 2023-04-10 ENCOUNTER — Other Ambulatory Visit: Payer: Self-pay

## 2023-04-10 ENCOUNTER — Ambulatory Visit (INDEPENDENT_AMBULATORY_CARE_PROVIDER_SITE_OTHER): Payer: 59 | Admitting: Family

## 2023-04-10 DIAGNOSIS — Z981 Arthrodesis status: Secondary | ICD-10-CM

## 2023-04-11 ENCOUNTER — Encounter: Payer: Self-pay | Admitting: Family

## 2023-04-11 NOTE — Progress Notes (Signed)
Post-Op Visit Note   Patient: Katrina Wolfe           Date of Birth: [birth year]           MRN: [id number] Visit Date: 04/10/2023 PCP: Georgann Housekeeper, MD  Chief Complaint:  Chief Complaint  Patient presents with   Left Ankle - Routine Post Op    02/21/2023 revision left ankle fusion    HPI:  HPI The patient is a 57 year old woman who presents status post left ankle fusion revision in March 6 of this year.    Has been in a short leg cast.  No concerns of pain or new problems reported today.  Ortho Exam On examination of the left ankle her incision is well-healed this is clean and dry.  No tenting of skin. Screw is not palpable. Deformity of the foot and the ankle which is chronic.  No edema no erythema.  Ankle is fixed. Visit Diagnoses:  1. H/O ankle fusion     Plan: Today recommend that she resume her cam walker.  Discussed the hardware failure.  At this time the patient has no pain or concerns.  She will continue nonweightbearing with follow-up with Dr. Lajoyce Corners in 3 weeks Follow-Up Instructions: No follow-ups on file.   Imaging: No results found.  Orders:  Orders Placed This Encounter  Procedures   XR Ankle Complete Left    No orders of the defined types were placed in this encounter.    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, with necrosis of bone    Diabetes mellitus type 2, uncontrolled, with complications 06/26/2021   Diabetic nephropathy 06/26/2021   CKD (chronic kidney disease), stage III 06/26/2021   HTN (hypertension) 06/24/2021   HIV (human immunodeficiency virus infection) 06/24/2021   DM2 (diabetes mellitus, type 2) 06/24/2021   HLD (hyperlipidemia) 06/24/2021   Osteomyelitis of left foot 06/24/2021   Elevated LFTs 06/24/2021    Acute-on-chronic kidney injury 06/24/2021   Osteomyelitis of foot 04/04/2019   Hardware complicating wound infection    Subacute osteomyelitis, left ankle and foot    Open displaced fracture of fifth metatarsal bone of left foot    Healthcare maintenance 04/03/2019   HIV disease 10/27/2015   DM type 2 (diabetes mellitus, type 2) 10/27/2015   Hyperlipidemia 10/27/2015   Essential hypertension 10/27/2015   Severe vulvar dysplasia, histologically confirmed 10/15/2015   Past Medical History:  Diagnosis Date   Abnormal uterine bleeding (AUB)    Arthritis    knees, fingers   Asthma    as child, no problem since 1995   CKD (chronic kidney disease), stage II    Congenital cardiomegaly    per pt has always been told since childhood and siblings    Diabetes mellitus without complication    Elevated liver function tests    GERD (gastroesophageal reflux disease)    History of genital warts    HIV (human immunodeficiency virus infection)    DX 1998--  MONITORED BY INFECTIOUS DISEASE-- DR Judyann Munson   Hypercholesterolemia    Hypertension    Intermittent palpitations    mild-- no meds   Legally blind in left eye, as defined in [place]    trauma as child   Peripheral vascular disease    Pneumonia  Renal disorder    Tuberculosis    ? 2005 or 2007   Type 2 diabetes mellitus    Type II    Uterine fibroid    VIN III (vulvar intraepithelial neoplasia III)    and Verrucoid lesion on the mons   Wears glasses     Family History  Problem Relation Age of Onset   Hypertension Mother    Diabetes Father    Cancer Brother    Breast cancer Cousin     Past Surgical History:  Procedure Laterality Date   ANKLE FUSION Left 02/21/2023   Procedure: REVISION LEFT ANKLE FUSION;  Surgeon: Nadara Mustard, MD;  [facility];  Service: Orthopedics;  Laterality: Left;   CESAREAN SECTION  2001  &  1984   CO2 LASER APPLICATION N/A 11/26/2015   Procedure: CO2 LASER APPLICATION AND WIDE VOCAL  EXCSION OF LESION ON MONS PUBIS;  Surgeon: De Blanch, MD;  Location: [facility];  Service: Gynecology;  Laterality: N/A;   CASE CANCELLED    CO2 LASER APPLICATION N/A 01/07/2016   Procedure: CO2 LASER OF VULVAR;  Surgeon: De Blanch, MD;  Location: [facility];  Service: Gynecology;  Laterality: N/A;   CORNEAL TRANSPLANT Left 12/18/2004   failed   D & C HYSTEROSCOPY /  RESECTION FIBROID  12/18/2002   DILATATION & CURETTAGE/HYSTEROSCOPY WITH MYOSURE N/A 10/01/2019   Procedure: DILATATION & CURETTAGE/HYSTEROSCOPY WITH MYOSURE and HYDROTHERMAL ABLATION;  Surgeon: Geryl Rankins, MD;  Location: [facility];  Service: Gynecology;  Laterality: N/A;  HTA rep will be here. Confirmed on 09/25/19 CS   EYE SURGERY     FOOT ARTHRODESIS Left 11/30/2021   Procedure: LEFT TIBIOCALCANEAL FUSION;  Surgeon: Nadara Mustard, MD;  Location: [facility];  Service: Orthopedics;  Laterality: Left;   HARDWARE REMOVAL Left 04/04/2019   Procedure: EXCISION BONE AND REMOVE DEEP HARDWARE LEFT 5TH METATARSAL;  Surgeon: Nadara Mustard, MD;  [facility];  Service: Orthopedics;  Laterality: Left;   HARDWARE REMOVAL Left 08/04/2022   Procedure: REMOVAL DEEP HARDWARE LEFT ANKLE;  Surgeon: Nadara Mustard, MD;  Location[facility];  Service: Orthopedics;  Laterality: Left;   I & D EXTREMITY Left 06/27/2021   Procedure: IRRIGATION AND DEBRIDEMENT FOOT ULCER;  Surgeon: Kerrin Champagne, MD;  [facility];  Service: Orthopedics;  Laterality: Left;   KNEE ARTHROSCOPY W/ ACL RECONSTRUCTION Bilateral 12/18/2006   ORIF TOE FRACTURE Left 08/02/2017   Procedure: OPEN REDUCTION INTERNAL FIXATION (ORIF) FIFTH METATARSAL (TOE) BASE FRACTURE;  Surgeon: Toni Arthurs, MD;  Location: [facility];  Service: Orthopedics;  Laterality: Left;   VULVECTOMY N/A 01/07/2016   Procedure: WIDE LOCAL EXCISION;  Surgeon: De Blanch, MD;  Location: [facility];  Service: Gynecology;  Laterality: N/A;  mons pubis as site   Social History   Occupational History   Not on file  Tobacco Use   Smoking status: Never   Smokeless tobacco: Never  Vaping Use   Vaping Use: Never used  Substance and Sexual Activity   Alcohol use: No    Alcohol/week: 0.0 standard drinks of alcohol   Drug use: No   Sexual activity: Yes    Partners: Male    Birth control/protection: Condom, Post-menopausal

## 2023-04-17 ENCOUNTER — Ambulatory Visit: Payer: Medicare Other | Admitting: Internal Medicine

## 2023-04-18 ENCOUNTER — Other Ambulatory Visit: Payer: Self-pay | Admitting: Internal Medicine

## 2023-04-18 NOTE — Telephone Encounter (Signed)
Appointment on 5/2

## 2023-04-19 ENCOUNTER — Other Ambulatory Visit: Payer: Self-pay

## 2023-04-19 ENCOUNTER — Encounter: Payer: Self-pay | Admitting: Internal Medicine

## 2023-04-19 ENCOUNTER — Ambulatory Visit (INDEPENDENT_AMBULATORY_CARE_PROVIDER_SITE_OTHER): Payer: 59 | Admitting: Internal Medicine

## 2023-04-19 VITALS — BP 128/84 | HR 98 | Resp 16 | Ht 69.0 in | Wt 264.0 lb

## 2023-04-19 DIAGNOSIS — B20 Human immunodeficiency virus [HIV] disease: Secondary | ICD-10-CM | POA: Diagnosis not present

## 2023-04-19 DIAGNOSIS — Z794 Long term (current) use of insulin: Secondary | ICD-10-CM

## 2023-04-19 DIAGNOSIS — Z79899 Other long term (current) drug therapy: Secondary | ICD-10-CM | POA: Diagnosis not present

## 2023-04-19 DIAGNOSIS — E1100 Type 2 diabetes mellitus with hyperosmolarity without nonketotic hyperglycemic-hyperosmolar coma (NKHHC): Secondary | ICD-10-CM | POA: Diagnosis not present

## 2023-04-19 DIAGNOSIS — N184 Chronic kidney disease, stage 4 (severe): Secondary | ICD-10-CM

## 2023-04-19 MED ORDER — DOVATO 50-300 MG PO TABS: ORAL_TABLET | ORAL | 11 refills | Status: DC

## 2023-04-19 NOTE — Progress Notes (Signed)
RFV: follow up for hiv disease  Patient ID: Katrina Wolfe, female   DOB: [birth year], 57 y.o.   MRN: [id number]  HPI Masako is a 57yo F with well controlled hiv disease, also hx of T2DM, HTN. Underwent revision ankle fusion due to hardware failure on 02/21/2023.on doxycycline. Will likely need to  BS dropped to 50 this morning. Was asleep at the time. Grand-daughter helped with getting her OJ. BS has been running 72-105.     Outpatient Encounter Medications as of 04/19/2023  Medication Sig   acetaminophen (TYLENOL) 500 MG tablet Take 1,000 mg by mouth every 6 (six) hours as needed for moderate pain.   amLODipine (NORVASC) 5 MG tablet Take 5 mg by mouth in the morning.   aspirin EC 81 MG tablet Take 81 mg by mouth daily. Swallow whole.   calcium carbonate (TUMS - DOSED IN MG ELEMENTAL CALCIUM) 500 MG chewable tablet Chew 1 tablet by mouth as needed for indigestion or heartburn.   Calcium Carbonate-Vitamin D 600-200 MG-UNIT TABS Take 1 tablet by mouth daily.   COMFORT EZ PEN NEEDLES 31G X 8 MM MISC USE WITH insulin THREE TIMES DAILY AS DIRECTED   Continuous Blood Gluc Receiver (DEXCOM G6 RECEIVER) DEVI as directed 30 days   diphenhydrAMINE (BENADRYL) 25 MG tablet Take 25 mg by mouth every 6 (six) hours as needed for allergies (or allergic reactions).   dolutegravir-lamiVUDine (DOVATO) 50-300 MG tablet TAKE ONE TABLET BY MOUTH EVERYDAY AT BEDTIME   doxycycline (VIBRA-TABS) 100 MG tablet Take 1 tablet (100 mg total) by mouth 2 (two) times daily. (Patient not taking: Reported on 04/19/2023)   Dulaglutide (TRULICITY) 4.5 MG/0.5ML SOPN Inject 4.5 mg into the skin every Friday.   gabapentin (NEURONTIN) 100 MG capsule Take 100 mg by mouth 4 (four) times daily.   glimepiride (AMARYL) 2 MG tablet Take 4 mg by mouth daily with breakfast.   HUMALOG KWIKPEN 100 UNIT/ML KwikPen Inject 10-15 Units into the skin daily with lunch.   HUMALOG MIX 75/25 KWIKPEN (75-25) 100 UNIT/ML Kwikpen Inject 60 Units into  the skin 2 (two) times daily. Breakfast and Dinner   Insulin Syringe-Needle U-100 (INSULIN SYRINGE 1CC/31GX5/16") 31G X 5/16" 1 ML MISC 1 Units by Does not apply route 2 (two) times daily.   Lancets (ONETOUCH ULTRASOFT) lancets    lisinopril-hydrochlorothiazide (ZESTORETIC) 20-25 MG tablet Take 1 tablet by mouth daily.   norethindrone (AYGESTIN) 5 MG tablet Take 5 mg by mouth in the morning and at bedtime.   ONE TOUCH ULTRA TEST test strip    oxyCODONE-acetaminophen (PERCOCET/ROXICET) 5-325 MG tablet Take 1 tablet by mouth every 4 (four) hours as needed.   rosuvastatin (CRESTOR) 5 MG tablet Take 5 mg by mouth every morning.   No facility-administered encounter medications on file as of 04/19/2023.     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 02/21/2023   S/P ankle fusion 11/30/2021   Charcot's joint, left ankle and foot    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, with necrosis of bone (HCC)    Diabetes mellitus type 2, uncontrolled, with complications 06/26/2021   Diabetic nephropathy (HCC) 06/26/2021   CKD (chronic kidney disease), stage III (HCC) 06/26/2021   HTN (hypertension) 06/24/2021   HIV (human immunodeficiency virus infection) (HCC) 06/24/2021   DM2 (diabetes mellitus, type 2) (HCC) 06/24/2021   HLD (hyperlipidemia) 06/24/2021   Osteomyelitis of left foot (HCC) 06/24/2021   Elevated LFTs  06/24/2021   Acute-on-chronic kidney injury (HCC) 06/24/2021   Osteomyelitis of foot (HCC) 04/04/2019   Hardware complicating wound infection (HCC)    Subacute osteomyelitis, left ankle and foot (HCC)    Open displaced fracture of fifth metatarsal bone of left foot    Healthcare maintenance 04/03/2019   HIV disease (HCC) 10/27/2015   DM type 2 (diabetes mellitus, type 2) (HCC) 10/27/2015   Hyperlipidemia 10/27/2015   Essential hypertension 10/27/2015   Severe vulvar dysplasia, histologically  confirmed 10/15/2015     Health Maintenance Due  Topic Date Due   COVID-19 Vaccine (1) Never done   FOOT EXAM  Never done   OPHTHALMOLOGY EXAM  Never done   Zoster Vaccines- Shingrix (1 of 2) Never done   COLONOSCOPY (Pts 45-38yrs Insurance coverage will need to be confirmed)  Never done   PAP SMEAR-Modifier  01/07/2022   Diabetic kidney evaluation - Urine ACR  05/17/2023   Medicare Annual Wellness (AWV)  05/27/2023     Review of Systems 12 point ros is negative except for hypoglycemia. Except what is mentioned above Physical Exam   BP 128/84   Pulse 98   Resp 16   Ht 5\' 9"  (1.753 m)   Wt 264 lb (119.7 kg)   LMP 04/27/2021   SpO2 98%   BMI 38.99 kg/m   Physical Exam  Constitutional:  oriented to person, place, and time. appears well-developed and well-nourished. No distress.  HENT: [place]/AT, PERRLA, no scleral icterus Mouth/Throat: Oropharynx is clear and moist. No oropharyngeal exudate.  Cardiovascular: Normal rate, regular rhythm and normal heart sounds. Exam reveals no gallop and no friction rub.  No murmur heard.  Pulmonary/Chest: Effort normal and breath sounds normal. No respiratory distress.  has no wheezes.  Neck = supple, no nuchal rigidity Abdominal: Soft. Bowel sounds are normal.  exhibits no distension. There is no tenderness.  Lymphadenopathy: no cervical adenopathy. No axillary adenopathy Neurological: alert and oriented to person, place, and time.  Skin: Skin is warm and dry. No rash noted. No erythema.  Psychiatric: a normal mood and affect.  behavior is normal.   Lab Results  Component Value Date   CD4TCELL 30 (L) 10/17/2022   Lab Results  Component Value Date   CD4TABS 575 10/17/2022   CD4TABS 638 05/19/2021   CD4TABS 654 02/22/2021   Lab Results  Component Value Date   HIV1RNAQUANT Not Detected 10/17/2022   Lab Results  Component Value Date   HEPBSAB NEG 10/13/2015   Lab Results  Component Value Date   LABRPR NON-REACTIVE 10/17/2022     CBC Lab Results  Component Value Date   WBC 15.9 (H) 02/21/2023   RBC 4.66 02/21/2023   HGB 15.5 (H) 02/21/2023   HCT 45.5 02/21/2023   PLT 466 (H) 02/21/2023   MCV 97.6 02/21/2023   MCH 33.3 02/21/2023   MCHC 34.1 02/21/2023   RDW 14.9 02/21/2023   LYMPHSABS 2,059 10/17/2022   MONOABS 0.9 07/04/2021   EOSABS 125 10/17/2022    BMET Lab Results  Component Value Date   NA 136 02/21/2023   K 3.8 02/21/2023   CL 104 02/21/2023   CO2 22 02/21/2023   GLUCOSE 63 (L) 02/21/2023   BUN 15 02/21/2023   CREATININE 1.34 (H) 02/21/2023   CALCIUM 8.9 02/21/2023   GFRNONAA 47 (L) 02/21/2023   GFRAA 48 (L) 02/22/2021      Assessment and Plan  T2DM on insulin = given hypoglycemia this morning and reports throughout the week. I recommend  to decreasing humalog to 55 units bid. Check with dr Donette Larry if that he thinks good idea or needs more of decrease.  Left foot charcot foot/hw failure  = latest surgery still has screw out of place and may need further surgery. Continue on doxy  Hiv disease = refill dovato. Continue on hiv meds. Will check labs  Ckd 3= check bmp to see that it is stable.

## 2023-04-20 LAB — T-HELPER CELL (CD4) - (RCID CLINIC ONLY)
CD4 % Helper T Cell: 33 % (ref 33–65)
CD4 T Cell Abs: 698 /uL (ref 400–1790)

## 2023-04-21 LAB — COMPLETE METABOLIC PANEL WITH GFR
AG Ratio: 1.2 (calc) (ref 1.0–2.5)
ALT: 37 U/L — ABNORMAL HIGH (ref 6–29)
AST: 36 U/L — ABNORMAL HIGH (ref 10–35)
Albumin: 4 g/dL (ref 3.6–5.1)
Alkaline phosphatase (APISO): 59 U/L (ref 37–153)
BUN/Creatinine Ratio: 15 (calc) (ref 6–22)
BUN: 20 mg/dL (ref 7–25)
CO2: 25 mmol/L (ref 20–32)
Calcium: 9.5 mg/dL (ref 8.6–10.4)
Chloride: 103 mmol/L (ref 98–110)
Creat: 1.36 mg/dL — ABNORMAL HIGH (ref 0.50–1.03)
Globulin: 3.3 g/dL (calc) (ref 1.9–3.7)
Glucose, Bld: 66 mg/dL (ref 65–99)
Potassium: 4.2 mmol/L (ref 3.5–5.3)
Sodium: 140 mmol/L (ref 135–146)
Total Bilirubin: 0.5 mg/dL (ref 0.2–1.2)
Total Protein: 7.3 g/dL (ref 6.1–8.1)
eGFR: 46 mL/min/{1.73_m2} — ABNORMAL LOW (ref >=60)

## 2023-04-21 LAB — CBC WITH DIFFERENTIAL/PLATELET
Absolute Monocytes: 789 cells/uL (ref 200–950)
Basophils Absolute: 35 cells/uL (ref 0–200)
Basophils Relative: 0.3 %
Eosinophils Absolute: 162 cells/uL (ref 15–500)
Eosinophils Relative: 1.4 %
HCT: 41.1 % (ref 35.0–45.0)
Hemoglobin: 13.8 g/dL (ref 11.7–15.5)
Lymphs Abs: 2564 cells/uL (ref 850–3900)
MCH: 32.3 pg (ref 27.0–33.0)
MCHC: 33.6 g/dL (ref 32.0–36.0)
MCV: 96.3 fL (ref 80.0–100.0)
MPV: 10.5 fL (ref 7.5–12.5)
Monocytes Relative: 6.8 %
Neutro Abs: 8050 cells/uL — ABNORMAL HIGH (ref 1500–7800)
Neutrophils Relative %: 69.4 %
Platelets: 466 10*3/uL — ABNORMAL HIGH (ref 140–400)
RBC: 4.27 10*6/uL (ref 3.80–5.10)
RDW: 13.9 % (ref 11.0–15.0)
Total Lymphocyte: 22.1 %
WBC: 11.6 10*3/uL — ABNORMAL HIGH (ref 3.8–10.8)

## 2023-04-21 LAB — HIV-1 RNA QUANT-NO REFLEX-BLD
HIV 1 RNA Quant: NOT DETECTED Copies/mL
HIV-1 RNA Quant, Log: NOT DETECTED Log cps/mL

## 2023-05-10 ENCOUNTER — Encounter: Payer: Self-pay | Admitting: Orthopedic Surgery

## 2023-05-10 ENCOUNTER — Ambulatory Visit (INDEPENDENT_AMBULATORY_CARE_PROVIDER_SITE_OTHER): Payer: 59 | Admitting: Orthopedic Surgery

## 2023-05-10 ENCOUNTER — Other Ambulatory Visit: Payer: Self-pay

## 2023-05-10 DIAGNOSIS — E11610 Type 2 diabetes mellitus with diabetic neuropathic arthropathy: Secondary | ICD-10-CM

## 2023-05-10 DIAGNOSIS — Z981 Arthrodesis status: Secondary | ICD-10-CM

## 2023-05-10 NOTE — Progress Notes (Signed)
Office Visit Note   Patient: Katrina Wolfe           Date of Birth: [birth year]           MRN: [id number] Visit Date: 05/10/2023              Requested by: Georgann Housekeeper, MD [address] [place],  [place] [address] PCP: Georgann Housekeeper, MD  Chief Complaint  Patient presents with   Left Ankle - Routine Post Op    02/21/2023 revision left ankle fusion      HPI: Patient is a 57 year old woman status post revision left ankle fusion 2 months out.  Patient has been in a cam walker nonweightbearing she states she has had some increased swelling over the last 2 days.  Assessment & Plan: Visit Diagnoses:  1. H/O ankle fusion   2. Charcot's arthropathy associated with type 2 diabetes mellitus (HCC)     Plan: Patient has had progressive collapse of the hardware secondary to the Charcot arthropathy.  Patient states that she is starting a calcium and vitamin D 3 supplement continue with the fracture boot and will follow-up in 4 weeks.  Three-view radiographs of the left ankle at follow-up.  Follow-Up Instructions: No follow-ups on file.   Ortho Exam  Patient is alert, oriented, no adenopathy, well-dressed, normal affect, normal respiratory effort. Examination the lateral foot wound and incision has completely healed.  There is no redness no cellulitis there is increased swelling.  Patient has a fibrous nonunion of the tibial talar fusion.  No skin complications or ulcers or cellulitis.  Patient has a strong dorsalis pedis pulse.  The good circulation and bony collapse is consistent with Charcot arthropathy.  Imaging: No results found. No images are attached to the encounter.  Labs: Lab Results  Component Value Date   HGBA1C 8.5 (H) 02/21/2023   HGBA1C 8.2 (H) 11/30/2021   HGBA1C 8.8 (H) 06/24/2021   ESRSEDRATE 53 (H) 08/15/2021   ESRSEDRATE 74 (H) 06/30/2021   CRP 18.5 (H) 08/15/2021   CRP 0.9 06/30/2021   REPTSTATUS 07/03/2021 FINAL 06/27/2021   GRAMSTAIN   06/27/2021    RARE WBC PRESENT,BOTH PMN AND MONONUCLEAR NO ORGANISMS SEEN    CULT  06/27/2021    RARE CORYNEBACTERIUM SPECIES Standardized susceptibility testing for this organism is not available. NO ANAEROBES ISOLATED Performed at [facility], [address] [place], [place] [address]    LABORGA PSEUDOMONAS AERUGINOSA 04/04/2019   LABORGA SERRATIA MARCESCENS 04/04/2019   LABORGA STAPHYLOCOCCUS AUREUS 04/04/2019     Lab Results  Component Value Date   ALBUMIN 3.5 08/04/2022   ALBUMIN 2.8 (L) 07/02/2021   ALBUMIN 2.8 (L) 07/01/2021    Lab Results  Component Value Date   MG 1.6 (L) 07/04/2021   MG 1.5 (L) 07/03/2021   MG 1.6 (L) 07/02/2021   No results found for: "VD25OH"  No results found for: "PREALBUMIN"    Latest Ref Rng & Units 04/19/2023    2:06 PM 02/21/2023    8:28 AM 10/17/2022    2:01 PM  CBC EXTENDED  WBC 3.8 - 10.8 Thousand/uL 11.6  15.9  10.4   RBC 3.80 - 5.10 Million/uL 4.27  4.66  4.80   Hemoglobin 11.7 - 15.5 g/dL 82.9  56.2  13.0   HCT 35.0 - 45.0 % 41.1  45.5  46.7   Platelets 140 - 400 Thousand/uL 466  466  389   NEUT# 1,500 - 7,800 cells/uL 8,050  7,571   Lymph# 850 - 3,900 cells/uL 2,564   2,059      There is no height or weight on file to calculate BMI.  Orders:  Orders Placed This Encounter  Procedures   XR Ankle Complete Left   No orders of the defined types were placed in this encounter.    Procedures: No procedures performed  Clinical Data: No additional findings.  ROS:  All other systems negative, except as noted in the HPI. Review of Systems  Objective: Vital Signs: LMP 04/27/2021   Specialty Comments:  No specialty comments available.  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, with  necrosis of bone (HCC)    Diabetes mellitus type 2, uncontrolled, with complications 06/26/2021   Diabetic nephropathy (HCC) 06/26/2021   CKD (chronic kidney disease), stage III (HCC) 06/26/2021   HTN (hypertension) 06/24/2021   HIV (human immunodeficiency virus infection) (HCC) 06/24/2021   DM2 (diabetes mellitus, type 2) (HCC) 06/24/2021   HLD (hyperlipidemia) 06/24/2021   Osteomyelitis of left foot (HCC) 06/24/2021   Elevated LFTs 06/24/2021   Acute-on-chronic kidney injury (HCC) 06/24/2021   Osteomyelitis of foot (HCC) 04/04/2019   Hardware complicating wound infection (HCC)    Subacute osteomyelitis, left ankle and foot (HCC)    Open displaced fracture of fifth metatarsal bone of left foot    Healthcare maintenance 04/03/2019   HIV disease (HCC) 10/27/2015   DM type 2 (diabetes mellitus, type 2) (HCC) 10/27/2015   Hyperlipidemia 10/27/2015   Essential hypertension 10/27/2015   Severe vulvar dysplasia, histologically confirmed 10/15/2015   Past Medical History:  Diagnosis Date   Abnormal uterine bleeding (AUB)    Arthritis    knees, fingers   Asthma    as child, no problem since 1995   CKD (chronic kidney disease), stage II    Congenital cardiomegaly    per pt has always been told since childhood and siblings    Diabetes mellitus without complication (HCC)    Elevated liver function tests    GERD (gastroesophageal reflux disease)    History of genital warts    HIV (human immunodeficiency virus infection) (HCC)    DX 1998--  MONITORED BY INFECTIOUS DISEASE-- DR Judyann Munson   Hypercholesterolemia    Hypertension    Intermittent palpitations    mild-- no meds   Legally blind in left eye, as defined in [place]    trauma as child   Peripheral vascular disease (HCC)    Pneumonia    Renal disorder    Tuberculosis    ? 2005 or 2007   Type 2 diabetes mellitus (HCC)    Type II    Uterine fibroid    VIN III (vulvar intraepithelial neoplasia III)    and Verrucoid lesion on  the mons   Wears glasses     Family History  Problem Relation Age of Onset   Hypertension Mother    Diabetes Father    Cancer Brother    Breast cancer Cousin     Past Surgical History:  Procedure Laterality Date   ANKLE FUSION Left 02/21/2023   Procedure: REVISION LEFT ANKLE FUSION;  Surgeon: Nadara Mustard, MD;  Location: [facility] OR;  Service: Orthopedics;  Laterality: Left;   CESAREAN SECTION  2001  &  1984   CO2 LASER APPLICATION N/A 11/26/2015   Procedure: CO2 LASER APPLICATION AND WIDE VOCAL EXCSION OF LESION ON MONS PUBIS;  Surgeon: De Blanch, MD;  Location[facility];  Service: Gynecology;  Laterality: N/A;   CASE CANCELLED    CO2 LASER APPLICATION N/A 01/07/2016   Procedure: CO2 LASER OF VULVAR;  Surgeon: De Blanch, MD;  Location: [facility];  Service: Gynecology;  Laterality: N/A;   CORNEAL TRANSPLANT Left 12/18/2004   failed   D & C HYSTEROSCOPY /  RESECTION FIBROID  12/18/2002   DILATATION & CURETTAGE/HYSTEROSCOPY WITH MYOSURE N/A 10/01/2019   Procedure: DILATATION & CURETTAGE/HYSTEROSCOPY WITH MYOSURE and HYDROTHERMAL ABLATION;  Surgeon: Geryl Rankins, MD;  Location: [facility];  Service: Gynecology;  Laterality: N/A;  HTA rep will be here. Confirmed on 09/25/19 CS   EYE SURGERY     FOOT ARTHRODESIS Left 11/30/2021   Procedure: LEFT TIBIOCALCANEAL FUSION;  Surgeon: Nadara Mustard, MD;  Location: [facility];  Service: Orthopedics;  Laterality: Left;   HARDWARE REMOVAL Left 04/04/2019   Procedure: EXCISION BONE AND REMOVE DEEP HARDWARE LEFT 5TH METATARSAL;  Surgeon: Nadara Mustard, MD;  Location: [facility] OR;  Service: Orthopedics;  Laterality: Left;   HARDWARE REMOVAL Left 08/04/2022   Procedure: REMOVAL DEEP HARDWARE LEFT ANKLE;  Surgeon: Nadara Mustard, MD;  Location: [facility] OR;  Service: Orthopedics;  Laterality: Left;   I & D EXTREMITY Left 06/27/2021   Procedure: IRRIGATION AND DEBRIDEMENT FOOT ULCER;  Surgeon: Kerrin Champagne, MD;  Location: [facility] OR;  Service: Orthopedics;  Laterality: Left;   KNEE ARTHROSCOPY W/ ACL RECONSTRUCTION Bilateral 12/18/2006   ORIF TOE FRACTURE Left 08/02/2017   Procedure: OPEN REDUCTION INTERNAL FIXATION (ORIF) FIFTH METATARSAL (TOE) BASE FRACTURE;  Surgeon: Toni Arthurs, MD;  Location: [facility];  Service: Orthopedics;  Laterality: Left;   VULVECTOMY N/A 01/07/2016   Procedure: WIDE LOCAL EXCISION;  Surgeon: De Blanch, MD;  Location: [facility];  Service: Gynecology;  Laterality: N/A;  mons pubis as site   Social History   Occupational History   Not on file  Tobacco Use   Smoking status: Never   Smokeless tobacco: Never  Vaping Use   Vaping Use: Never used  Substance and Sexual Activity   Alcohol use: No    Alcohol/week: 0.0 standard drinks of alcohol   Drug use: No   Sexual activity: Yes    Partners: Male    Birth control/protection: Condom, Post-menopausal

## 2023-05-28 DIAGNOSIS — L97519 Non-pressure chronic ulcer of other part of right foot with unspecified severity: Secondary | ICD-10-CM | POA: Diagnosis not present

## 2023-05-28 DIAGNOSIS — E1142 Type 2 diabetes mellitus with diabetic polyneuropathy: Secondary | ICD-10-CM | POA: Diagnosis not present

## 2023-05-28 DIAGNOSIS — I10 Essential (primary) hypertension: Secondary | ICD-10-CM | POA: Diagnosis not present

## 2023-05-28 DIAGNOSIS — E1165 Type 2 diabetes mellitus with hyperglycemia: Secondary | ICD-10-CM | POA: Diagnosis not present

## 2023-05-28 DIAGNOSIS — Z0000 Encounter for general adult medical examination without abnormal findings: Secondary | ICD-10-CM | POA: Diagnosis not present

## 2023-05-28 DIAGNOSIS — E1122 Type 2 diabetes mellitus with diabetic chronic kidney disease: Secondary | ICD-10-CM | POA: Diagnosis not present

## 2023-05-28 DIAGNOSIS — E11621 Type 2 diabetes mellitus with foot ulcer: Secondary | ICD-10-CM | POA: Diagnosis not present

## 2023-05-28 DIAGNOSIS — N1831 Chronic kidney disease, stage 3a: Secondary | ICD-10-CM | POA: Diagnosis not present

## 2023-05-28 DIAGNOSIS — E7800 Pure hypercholesterolemia, unspecified: Secondary | ICD-10-CM | POA: Diagnosis not present

## 2023-05-28 DIAGNOSIS — E11649 Type 2 diabetes mellitus with hypoglycemia without coma: Secondary | ICD-10-CM | POA: Diagnosis not present

## 2023-05-28 DIAGNOSIS — Z794 Long term (current) use of insulin: Secondary | ICD-10-CM | POA: Diagnosis not present

## 2023-06-11 ENCOUNTER — Other Ambulatory Visit (INDEPENDENT_AMBULATORY_CARE_PROVIDER_SITE_OTHER): Payer: 59

## 2023-06-11 ENCOUNTER — Ambulatory Visit (INDEPENDENT_AMBULATORY_CARE_PROVIDER_SITE_OTHER): Payer: 59 | Admitting: Orthopedic Surgery

## 2023-06-11 DIAGNOSIS — E11610 Type 2 diabetes mellitus with diabetic neuropathic arthropathy: Secondary | ICD-10-CM

## 2023-06-11 DIAGNOSIS — Z981 Arthrodesis status: Secondary | ICD-10-CM

## 2023-06-12 ENCOUNTER — Encounter: Payer: Self-pay | Admitting: Orthopedic Surgery

## 2023-06-12 NOTE — Progress Notes (Signed)
Office Visit Note   Patient: Katrina Wolfe           Date of Birth: Jun 20, 1966           MRN: [id number] Visit Date: 06/11/2023              Requested by: Georgann Housekeeper, MD [address] [place],  [place] [address] PCP: Georgann Housekeeper, MD  Chief Complaint  Patient presents with   Left Ankle - Routine Post Op    02/21/2023 revision left ankle fusion      HPI: Patient is a 57 year old woman is seen in follow-up status post revision ankle fusion for Charcot collapse.  Patient states she has been having increasing deformity and laxity.  She is currently on doxycycline.  Assessment & Plan: Visit Diagnoses:  1. H/O ankle fusion   2. Charcot's arthropathy associated with type 2 diabetes mellitus (HCC)     Plan: With the progressive Charcot collapse and failure of the hardware do not feel there are any further foot salvage intervention options.  Have recommended proceeding with a transtibial amputation.  Patient states she understands wished to proceed at this time she would like to proceed around July 8.  Follow-Up Instructions: Return if symptoms worsen or fail to improve.   Ortho Exam  Patient is alert, oriented, no adenopathy, well-dressed, normal affect, normal respiratory effort. Examination patient has had progressive varus collapse of the hindfoot.  She has recurrent lateral foot ulceration.  The fusion site is unstable and mobile.  Imaging: No results found. No images are attached to the encounter.  Labs: Lab Results  Component Value Date   HGBA1C 8.5 (H) 02/21/2023   HGBA1C 8.2 (H) 11/30/2021   HGBA1C 8.8 (H) 06/24/2021   ESRSEDRATE 53 (H) 08/15/2021   ESRSEDRATE 74 (H) 06/30/2021   CRP 18.5 (H) 08/15/2021   CRP 0.9 06/30/2021   REPTSTATUS 07/03/2021 FINAL 06/27/2021   GRAMSTAIN  06/27/2021    RARE WBC PRESENT,BOTH PMN AND MONONUCLEAR NO ORGANISMS SEEN    CULT  06/27/2021    RARE CORYNEBACTERIUM SPECIES Standardized susceptibility testing for  this organism is not available. NO ANAEROBES ISOLATED Performed at [facility], [address] [place], [place] [address]    LABORGA PSEUDOMONAS AERUGINOSA 04/04/2019   LABORGA SERRATIA MARCESCENS 04/04/2019   LABORGA STAPHYLOCOCCUS AUREUS 04/04/2019     Lab Results  Component Value Date   ALBUMIN 3.5 08/04/2022   ALBUMIN 2.8 (L) 07/02/2021   ALBUMIN 2.8 (L) 07/01/2021    Lab Results  Component Value Date   MG 1.6 (L) 07/04/2021   MG 1.5 (L) 07/03/2021   MG 1.6 (L) 07/02/2021   No results found for: "VD25OH"  No results found for: "PREALBUMIN"    Latest Ref Rng & Units 04/19/2023    2:06 PM 02/21/2023    8:28 AM 10/17/2022    2:01 PM  CBC EXTENDED  WBC 3.8 - 10.8 Thousand/uL 11.6  15.9  10.4   RBC 3.80 - 5.10 Million/uL 4.27  4.66  4.80   Hemoglobin 11.7 - 15.5 g/dL 65.7  84.6  96.2   HCT 35.0 - 45.0 % 41.1  45.5  46.7   Platelets 140 - 400 Thousand/uL 466  466  389   NEUT# 1,500 - 7,800 cells/uL 8,050   7,571   Lymph# 850 - 3,900 cells/uL 2,564   2,059      There is no height or weight on file to calculate BMI.  Orders:  Orders  Placed This Encounter  Procedures   XR Ankle Complete Left   No orders of the defined types were placed in this encounter.    Procedures: No procedures performed  Clinical Data: No additional findings.  ROS:  All other systems negative, except as noted in the HPI. Review of Systems  Objective: Vital Signs: LMP 04/27/2021   Specialty Comments:  No specialty comments available.  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, with necrosis of bone (HCC)    Diabetes mellitus type 2, uncontrolled, with complications 06/26/2021   Diabetic nephropathy (HCC) 06/26/2021   CKD (chronic kidney disease),  stage III (HCC) 06/26/2021   HTN (hypertension) 06/24/2021   HIV (human immunodeficiency virus infection) (HCC) 06/24/2021   DM2 (diabetes mellitus, type 2) (HCC) 06/24/2021   HLD (hyperlipidemia) 06/24/2021   Osteomyelitis of left foot (HCC) 06/24/2021   Elevated LFTs 06/24/2021   Acute-on-chronic kidney injury (HCC) 06/24/2021   Osteomyelitis of foot (HCC) 04/04/2019   Hardware complicating wound infection (HCC)    Subacute osteomyelitis, left ankle and foot (HCC)    Open displaced fracture of fifth metatarsal bone of left foot    Healthcare maintenance 04/03/2019   HIV disease (HCC) 10/27/2015   DM type 2 (diabetes mellitus, type 2) (HCC) 10/27/2015   Hyperlipidemia 10/27/2015   Essential hypertension 10/27/2015   Severe vulvar dysplasia, histologically confirmed 10/15/2015   Past Medical History:  Diagnosis Date   Abnormal uterine bleeding (AUB)    Arthritis    knees, fingers   Asthma    as child, no problem since 1995   CKD (chronic kidney disease), stage II    Congenital cardiomegaly    per pt has always been told since childhood and siblings    Diabetes mellitus without complication (HCC)    Elevated liver function tests    GERD (gastroesophageal reflux disease)    History of genital warts    HIV (human immunodeficiency virus infection) (HCC)    DX 1998--  MONITORED BY INFECTIOUS DISEASE-- DR Judyann Munson   Hypercholesterolemia    Hypertension    Intermittent palpitations    mild-- no meds   Legally blind in left eye, as defined in [place]    trauma as child   Peripheral vascular disease (HCC)    Pneumonia    Renal disorder    Tuberculosis    ? 2005 or 2007   Type 2 diabetes mellitus (HCC)    Type II    Uterine fibroid    VIN III (vulvar intraepithelial neoplasia III)    and Verrucoid lesion on the mons   Wears glasses     Family History  Problem Relation Age of Onset   Hypertension Mother    Diabetes Father    Cancer Brother    Breast cancer Cousin      Past Surgical History:  Procedure Laterality Date   ANKLE FUSION Left 02/21/2023   Procedure: REVISION LEFT ANKLE FUSION;  Surgeon: Nadara Mustard, MD;  [facility];  Service: Orthopedics;  Laterality: Left;   CESAREAN SECTION  2001  &  1984   CO2 LASER APPLICATION N/A 11/26/2015   Procedure: CO2 LASER APPLICATION AND WIDE VOCAL EXCSION OF LESION ON MONS PUBIS;  Surgeon: De Blanch, MD;  Location: [facility];  Service: Gynecology;  Laterality: N/A;   CASE CANCELLED    CO2 LASER APPLICATION N/A 01/07/2016   Procedure: CO2 LASER OF VULVAR;  Surgeon: De Blanch, MD;  Location: [facility];  Service: Gynecology;  Laterality: N/A;   CORNEAL TRANSPLANT Left 12/18/2004   failed   D & C HYSTEROSCOPY /  RESECTION FIBROID  12/18/2002   DILATATION & CURETTAGE/HYSTEROSCOPY WITH MYOSURE N/A 10/01/2019   Procedure: DILATATION & CURETTAGE/HYSTEROSCOPY WITH MYOSURE and HYDROTHERMAL ABLATION;  Surgeon: Geryl Rankins, MD;  Location: [facility];  Service: Gynecology;  Laterality: N/A;  HTA rep will be here. Confirmed on 09/25/19 CS   EYE SURGERY     FOOT ARTHRODESIS Left 11/30/2021   Procedure: LEFT TIBIOCALCANEAL FUSION;  Surgeon: Nadara Mustard, MD;  Location: [facility] OR;  Service: Orthopedics;  Laterality: Left;   HARDWARE REMOVAL Left 04/04/2019   Procedure: EXCISION BONE AND REMOVE DEEP HARDWARE LEFT 5TH METATARSAL;  Surgeon: Nadara Mustard, MD;  [facility];  Service: Orthopedics;  Laterality: Left;   HARDWARE REMOVAL Left 08/04/2022   Procedure: REMOVAL DEEP HARDWARE LEFT ANKLE;  Surgeon: Nadara Mustard, MD;  Location: [facility] OR;  Service: Orthopedics;  Laterality: Left;   I & D EXTREMITY Left 06/27/2021   Procedure: IRRIGATION AND DEBRIDEMENT FOOT ULCER;  Surgeon: Kerrin Champagne, MD;  [facility];  Service: Orthopedics;  Laterality: Left;   KNEE ARTHROSCOPY W/ ACL RECONSTRUCTION Bilateral 12/18/2006   ORIF TOE FRACTURE Left  08/02/2017   Procedure: OPEN REDUCTION INTERNAL FIXATION (ORIF) FIFTH METATARSAL (TOE) BASE FRACTURE;  Surgeon: Toni Arthurs, MD;  Location: [facility];  Service: Orthopedics;  Laterality: Left;   VULVECTOMY N/A 01/07/2016   Procedure: WIDE LOCAL EXCISION;  Surgeon: De Blanch, MD;  Location: [facility];  Service: Gynecology;  Laterality: N/A;  mons pubis as site   Social History   Occupational History   Not on file  Tobacco Use   Smoking status: Never   Smokeless tobacco: Never  Vaping Use   Vaping Use: Never used  Substance and Sexual Activity   Alcohol use: No    Alcohol/week: 0.0 standard drinks of alcohol   Drug use: No   Sexual activity: Yes    Partners: Male    Birth control/protection: Condom, Post-menopausal

## 2023-06-25 ENCOUNTER — Encounter (HOSPITAL_COMMUNITY): Payer: Self-pay | Admitting: Orthopedic Surgery

## 2023-06-25 ENCOUNTER — Other Ambulatory Visit: Payer: Self-pay

## 2023-06-25 NOTE — Progress Notes (Signed)
SDW call  Patient was given pre-op instructions over the phone. Patient verbalized understanding of instructions provided.    PCP - Dr. Georgann Housekeeper Cardiologist - Dr. Lance Muss Pulmonary: denies   PPM/ICD - denies  Chest x-ray - n/a EKG -  DOS 06/27/2023 Stress Test - ECHO - 06/27/2022 Cardiac Cath -   Sleep Study/sleep apnea/CPAP: Denies  Type II diabetic Fasting Blood sugar range: 100-300 How often check sugars: Continuous with Dexcom G6 to right arm Trulicity, states last dose was end of May, 2024.  She has been out of this medication.  Glimepiride (Amaryl), instructed do not take the day of surgery Actos, instructed do not take the day of surgery Humalog 75/25, instructed evening dose on 06/26/2023 to use 32 units (70% regular dose).  Day of surgery zero units   Blood Thinner Instructions: denies Aspirin Instructions: states last dose 7/8/20204   ERAS Protcol - Yes, clear fluids until 1130 PRE-SURGERY Ensure or G2-    COVID TEST- n/a    Anesthesia review: HTN, DM, PVD, CKD, HIV   Patient denies shortness of breath, fever, cough and chest pain over the phone call  Your procedure is scheduled on Wednesday June 27, 2023  Report to [facility] Main Entrance "A" at 1150 A.M., then check in with the Admitting office.  Call this number if you have problems the morning of surgery:  [phone]   If you have any questions prior to your surgery date call [phone]: Open Monday-Friday 8am-4pm If you experience any cold or flu symptoms such as cough, fever, chills, shortness of breath, etc. between now and your scheduled surgery, please notify us at the above number    Remember:  Do not eat after midnight the night before your surgery  You may drink clear liquids until  1130   the morning of your surgery.   Clear liquids allowed are: Water, Non-Citrus Juices (without pulp), Carbonated Beverages, Clear Tea, Black Coffee ONLY (NO MILK, CREAM OR POWDERED CREAMER of any  kind), and Gatorade   Take these medicines the morning of surgery with A SIP OF WATER:  Doxycycline, gabapentin, protonix,  As needed: Tylenol, ultram  As of today, STOP taking any Aspirin (unless otherwise instructed by your surgeon) Aleve, Naproxen, Ibuprofen, Motrin, Advil, Goody's, BC's, all herbal medications, fish oil, and all vitamins.

## 2023-06-26 MED ORDER — TRANEXAMIC ACID 1000 MG/10ML IV SOLN
2000.0000 mg | INTRAVENOUS | Status: DC
  Filled 2023-06-26: qty 20

## 2023-06-26 NOTE — Anesthesia Preprocedure Evaluation (Signed)
Anesthesia Evaluation    Airway        Dental   Pulmonary           Cardiovascular hypertension,      Neuro/Psych    GI/Hepatic   Endo/Other  diabetes    Renal/GU      Musculoskeletal   Abdominal   Peds  Hematology   Anesthesia Other Findings   Reproductive/Obstetrics                             Anesthesia Physical Anesthesia Plan  ASA:   Anesthesia Plan:    Post-op Pain Management:    Induction:   PONV Risk Score and Plan:   Airway Management Planned:   Additional Equipment:   Intra-op Plan:   Post-operative Plan:   Informed Consent:   Plan Discussed with:   Anesthesia Plan Comments: (PAT note written 06/26/2023 by Allison Zelenak, PA-C.  )       Anesthesia Quick Evaluation    hardware 08/04/22 & revision 02/21/23), obesity.    She had cardiology evaluation for palpitations on 06/07/22 by Dr. Eldridge Dace. Reported feeling like heart  racing for a few seconds about once a month. Stress seemed to be a trigger. Can feel SOB with episodes. No chest pain or syncope. Symptoms correlated with PVC on EKG. Echo 06/27/22 showed LVEF 55-60%, no regional wall motion abnormalities, grade 1 diastolic dysfunction, normal RV systolic function, normal PASP, no significant valvular disease.  As needed cardiology follow-up recommended.   A1c 8.5% on 02/21/23. She wears a continuous Dexcom G6 (right arm) glucose monitor. She reportedly ran out of Trulicity, so last dose was in May. Other DM medications include glimepiride, Actos, Humalog 75/25.   EKG: 06/07/2022: Sinus rhythm with occasional premature ventricular complexes.  Cannot rule out anterior infarct, age undetermined.     CV: Echo 06/27/22: IMPRESSIONS   1. Left ventricular ejection fraction, by estimation, is 55 to 60%. The  left ventricle has normal function. The left ventricle has no regional  wall motion abnormalities. There is mild concentric left ventricular  hypertrophy. Left ventricular diastolic  parameters are consistent with Grade I diastolic dysfunction (impaired  relaxation). The average left ventricular global longitudinal strain is  -18.6 %. The global longitudinal strain is normal.   2. Right ventricular systolic function is normal. The right ventricular  size is normal. There is normal pulmonary artery systolic pressure.   3. The mitral valve is normal in structure. No evidence of mitral valve  regurgitation. No evidence of mitral stenosis.   4. The aortic valve is tricuspid. Aortic valve regurgitation is not  visualized. No aortic stenosis is present.   5. The inferior vena cava is normal in size with greater than 50%  respiratory variability, suggesting right atrial pressure of 3 mmHg.  )       Anesthesia Quick Evaluation

## 2023-06-26 NOTE — Progress Notes (Signed)
Anesthesia Chart Review: SAME DAY WORK-UP  Case: [id number] Date/Time: 06/27/23 1406   Procedure: LEFT BELOW KNEE AMPUTATION (Left: Knee)   Anesthesia type: Choice   Pre-op diagnosis: Charcot Collapse Left Ankle   [facility] ROOM 04 / [facility]   Surgeons: Nadara Mustard, MD       DISCUSSION: Patient is a 57 year old female scheduled for the above procedure.   History includes never smoker, HIV, HTN, hypercholesterolemia, DM2, cardiomegaly (added to history in 2016; mild concentric LVH 06/27/22 echo), palpitations (2016; correlated with occasional PVC 05/2022), GERD, left eye legally blind (due to trauma; left corneal transplant 2006), childhood asthma, TB (2005 or 2007; negative Interferon Gamma Release Assay 12/08/15), CKD (stage III), PVD, vulvar cancer (s/p wide excision high grade squamous VIN III lesion 01/07/16), fibroid/endometrial polyp (s/p D&C/endometrial ablation 10/01/19), left 5th metatarsal fracture (s/p ORIF 08/02/17, s/p removal of hardware due to infection 04/04/19), left Charcot foot (s/p left tibiocalcaneal fusion 11/30/21, removal of deep hardware for failed hardware 08/04/22 & revision 02/21/23), obesity.    She had cardiology evaluation for palpitations on 06/07/22 by Dr. Eldridge Dace. Reported feeling like heart racing for a few seconds about once a month. Stress seemed to be a trigger. Can feel SOB with episodes. No chest pain or syncope. Symptoms correlated with PVC on EKG. Echo 06/27/22 showed LVEF 55-60%, no regional wall motion abnormalities, grade 1 diastolic dysfunction, normal RV systolic function, normal PASP, no significant valvular disease.  As needed cardiology follow-up recommended.  A1c 8.5% on 02/21/23. She wears a continuous Dexcom G6 (right arm) glucose monitor. She reportedly ran out of Trulicity, so last dose was in May. Other DM medications include glimepiride, Actos, Humalog 75/25.   Anesthesia to evaluate on the day of surgery.     VS: BP 124/73   Pulse 89   Temp  36.7 C (Oral)   Resp 18   Ht 5' 9.5" (1.765 m)   Wt 121.1 kg   SpO2 97%   BMI 38.86 kg/m  LMP documented as 09/2020.    VS: Ht 5' 9.5" (1.765 m)   Wt 119.7 kg   LMP 04/27/2021   BMI 38.43 kg/m  BP Readings from Last 3 Encounters:  04/19/23 128/84  02/22/23 123/75  10/17/22 102/63   Pulse Readings from Last 3 Encounters:  04/19/23 98  02/22/23 98  10/17/22 82    PROVIDERS: Georgann Housekeeper, MD is PCP [facility]) - Lance Muss, MD is cardiologist. Follow-up as needed per 06/07/22 office visit for palpitations.  Judyann Munson, MD is ID. She has been treated for HIV since 1999. Moved to [place] from [place] ~ 2015. Last visit 04/19/23 for follow-up well controlled HIV. On Dovato.   Mickie Kay, MD is ophthalmologist [facility])   LABS: For day of surgery as indicated. Last results in [facility]: [facility]onda W include: Lab Results  Component Value Date   WBC 11.6 (H) 04/19/2023   HGB 13.8 04/19/2023   HCT 41.1 04/19/2023   PLT 466 (H) 04/19/2023   GLUCOSE 66 04/19/2023   ALT 37 (H) 04/19/2023   AST 36 (H) 04/19/2023   NA 140 04/19/2023   K 4.2 04/19/2023   CL 103 04/19/2023   CREATININE 1.36 (H) 04/19/2023   BUN 20 04/19/2023   CO2 25 04/19/2023   HGBA1C 8.5 (H) 02/21/2023    EKG: 06/07/2022: Sinus rhythm with occasional premature ventricular complexes.  Cannot rule out anterior infarct, age undetermined.   CV: Echo 06/27/22: IMPRESSIONS   1. Left ventricular ejection  fraction, by estimation, is 55 to 60%. The  left ventricle has normal function. The left ventricle has no regional  wall motion abnormalities. There is mild concentric left ventricular  hypertrophy. Left ventricular diastolic  parameters are consistent with Grade I diastolic dysfunction (impaired  relaxation). The average left ventricular global longitudinal strain is  -18.6 %. The global longitudinal strain is normal.   2. Right ventricular systolic function is normal. The right ventricular  size is normal. There  is normal pulmonary artery systolic pressure.   3. The mitral valve is normal in structure. No evidence of mitral valve  regurgitation. No evidence of mitral stenosis.   4. The aortic valve is tricuspid. Aortic valve regurgitation is not  visualized. No aortic stenosis is present.   5. The inferior vena cava is normal in size with greater than 50%  respiratory variability, suggesting right atrial pressure of 3 mmHg.    Past Medical History:  Diagnosis Date   Abnormal uterine bleeding (AUB)    Arthritis    knees, fingers   Asthma    as child, no problem since 1995   Bell's palsy 2004   CKD (chronic kidney disease), stage II    Complication of anesthesia    Congenital cardiomegaly    per pt has always been told since childhood and siblings    Diabetes mellitus without complication (HCC)    Dysrhythmia    Occasionally skipped beats   Elevated liver function tests    GERD (gastroesophageal reflux disease)    History of genital warts    HIV (human immunodeficiency virus infection) (HCC)    DX 1998--  MONITORED BY INFECTIOUS DISEASE-- DR Judyann Munson   Hypercholesterolemia    Hypertension    Intermittent palpitations    mild-- no meds   Legally blind in left eye, as defined in [place]    trauma as child   Peripheral vascular disease (HCC)    Pneumonia    PONV (postoperative nausea and vomiting)    Renal disorder    Tuberculosis    ? 2005 or 2007   Type 2 diabetes mellitus (HCC)    Type II    Uterine fibroid    VIN III (vulvar intraepithelial neoplasia III)    and Verrucoid lesion on the mons   Wears glasses     Past Surgical History:  Procedure Laterality Date   ANKLE FUSION Left 02/21/2023   Procedure: REVISION LEFT ANKLE FUSION;  Surgeon: Nadara Mustard, MD;  [facility]: [facility];  Service: Orthopedics;  Laterality: Left;   CESAREAN SECTION  2001  &  1984   CO2 LASER APPLICATION N/A 11/26/2015   Procedure: CO2 LASER APPLICATION AND WIDE VOCAL EXCSION OF LESION ON MONS PUBIS;   Surgeon: De Blanch, MD;  Location: [facility];  Service: Gynecology;  Laterality: N/A;   CASE CANCELLED    CO2 LASER APPLICATION N/A 01/07/2016   Procedure: CO2 LASER OF VULVAR;  Surgeon: De Blanch, MD;  Location: [facility];  Service: Gynecology;  Laterality: N/A;   CORNEAL TRANSPLANT Left 12/18/2004   failed   D & C HYSTEROSCOPY /  RESECTION FIBROID  12/18/2002   DILATATION & CURETTAGE/HYSTEROSCOPY WITH MYOSURE N/A 10/01/2019   Procedure: DILATATION & CURETTAGE/HYSTEROSCOPY WITH MYOSURE and HYDROTHERMAL ABLATION;  Surgeon: Geryl Rankins, MD;  Location: [facility];  Service: Gynecology;  Laterality: N/A;  HTA rep will be here. Confirmed on 09/25/19 CS   EYE SURGERY     FOOT ARTHRODESIS  Left 11/30/2021   Procedure: LEFT TIBIOCALCANEAL FUSION;  Surgeon: Nadara Mustard, MD;  Location: [facility] OR;  Service: Orthopedics;  Laterality: Left;   HARDWARE REMOVAL Left 04/04/2019   Procedure: EXCISION BONE AND REMOVE DEEP HARDWARE LEFT 5TH METATARSAL;  Surgeon: Nadara Mustard, MD;  [facility];  Service: Orthopedics;  Laterality: Left;   HARDWARE REMOVAL Left 08/04/2022   Procedure: REMOVAL DEEP HARDWARE LEFT ANKLE;  Surgeon: Nadara Mustard, MD;  Location: [facility] OR;  Service: Orthopedics;  Laterality: Left;   I & D EXTREMITY Left 06/27/2021   Procedure: IRRIGATION AND DEBRIDEMENT FOOT ULCER;  Surgeon: Kerrin Champagne, MD;  [facility];  Service: Orthopedics;  Laterality: Left;   KNEE ARTHROSCOPY W/ ACL RECONSTRUCTION Bilateral 12/18/2006   ORIF TOE FRACTURE Left 08/02/2017   Procedure: OPEN REDUCTION INTERNAL FIXATION (ORIF) FIFTH METATARSAL (TOE) BASE FRACTURE;  Surgeon: Toni Arthurs, MD;  Location: [facility];  Service: Orthopedics;  Laterality: Left;   VULVECTOMY N/A 01/07/2016   Procedure: WIDE LOCAL EXCISION;  Surgeon: De Blanch, MD;  Location: [facility];  Service: Gynecology;   Laterality: N/A;  mons pubis as site    MEDICATIONS: No current facility-administered medications for this encounter.    acetaminophen (TYLENOL) 500 MG tablet   aspirin EC 81 MG tablet   calcium carbonate (TUMS - DOSED IN MG ELEMENTAL CALCIUM) 500 MG chewable tablet   Calcium Carbonate-Vitamin D 600-200 MG-UNIT TABS   diphenhydrAMINE (BENADRYL) 25 MG tablet   dolutegravir-lamiVUDine (DOVATO) 50-300 MG tablet   doxycycline (VIBRA-TABS) 100 MG tablet   Dulaglutide (TRULICITY) 4.5 MG/0.5ML SOPN   gabapentin (NEURONTIN) 100 MG capsule   glimepiride (AMARYL) 2 MG tablet   HUMALOG MIX 75/25 KWIKPEN (75-25) 100 UNIT/ML Kwikpen   lisinopril-hydrochlorothiazide (ZESTORETIC) 20-25 MG tablet   norethindrone (AYGESTIN) 5 MG tablet   pantoprazole (PROTONIX) 20 MG tablet   pioglitazone (ACTOS) 30 MG tablet   rosuvastatin (CRESTOR) 5 MG tablet   traMADol (ULTRAM) 50 MG tablet   amLODipine (NORVASC) 5 MG tablet   COMFORT EZ PEN NEEDLES 31G X 8 MM MISC   Continuous Blood Gluc Receiver (DEXCOM G6 RECEIVER) DEVI   Insulin Syringe-Needle U-100 (INSULIN SYRINGE 1CC/31GX5/16") 31G X 5/16" 1 ML MISC   Lancets (ONETOUCH ULTRASOFT) lancets   ONE TOUCH ULTRA TEST test strip    Shonna Chock, PA-C Surgical Short Stay/Anesthesiology [facility] Phone [phone] [facility] Phone [phone] 06/26/2023 9:24 AM

## 2023-06-27 ENCOUNTER — Inpatient Hospital Stay (HOSPITAL_COMMUNITY): Payer: 59 | Admitting: Vascular Surgery

## 2023-06-27 ENCOUNTER — Other Ambulatory Visit: Payer: Self-pay

## 2023-06-27 ENCOUNTER — Inpatient Hospital Stay (HOSPITAL_COMMUNITY)
Admission: RE | Admit: 2023-06-27 | Discharge: 2023-07-01 | DRG: 041 | Disposition: A | Discharge status: Rehabilitation Facility | Payer: 59 | Attending: Orthopedic Surgery | Admitting: Orthopedic Surgery

## 2023-06-27 ENCOUNTER — Encounter (HOSPITAL_COMMUNITY): Payer: Self-pay | Admitting: Orthopedic Surgery

## 2023-06-27 ENCOUNTER — Encounter (HOSPITAL_COMMUNITY)
Admission: RE | Disposition: A | Discharge status: Rehabilitation Facility | Payer: Self-pay | Source: Home / Self Care | Attending: Orthopedic Surgery

## 2023-06-27 DIAGNOSIS — M869 Osteomyelitis, unspecified: Secondary | ICD-10-CM | POA: Diagnosis not present

## 2023-06-27 DIAGNOSIS — Z8611 Personal history of tuberculosis: Secondary | ICD-10-CM

## 2023-06-27 DIAGNOSIS — E7800 Pure hypercholesterolemia, unspecified: Secondary | ICD-10-CM | POA: Diagnosis present

## 2023-06-27 DIAGNOSIS — M14662 Charcot's joint, left knee: Secondary | ICD-10-CM | POA: Diagnosis not present

## 2023-06-27 DIAGNOSIS — I10 Essential (primary) hypertension: Secondary | ICD-10-CM

## 2023-06-27 DIAGNOSIS — H548 Legal blindness, as defined in USA: Secondary | ICD-10-CM | POA: Diagnosis present

## 2023-06-27 DIAGNOSIS — Z947 Corneal transplant status: Secondary | ICD-10-CM

## 2023-06-27 DIAGNOSIS — Z21 Asymptomatic human immunodeficiency virus [HIV] infection status: Secondary | ICD-10-CM | POA: Diagnosis present

## 2023-06-27 DIAGNOSIS — E11610 Type 2 diabetes mellitus with diabetic neuropathic arthropathy: Secondary | ICD-10-CM | POA: Diagnosis not present

## 2023-06-27 DIAGNOSIS — Z833 Family history of diabetes mellitus: Secondary | ICD-10-CM

## 2023-06-27 DIAGNOSIS — I493 Ventricular premature depolarization: Secondary | ICD-10-CM | POA: Diagnosis not present

## 2023-06-27 DIAGNOSIS — M14672 Charcot's joint, left ankle and foot: Secondary | ICD-10-CM | POA: Diagnosis not present

## 2023-06-27 DIAGNOSIS — Z91013 Allergy to seafood: Secondary | ICD-10-CM

## 2023-06-27 DIAGNOSIS — Z809 Family history of malignant neoplasm, unspecified: Secondary | ICD-10-CM

## 2023-06-27 DIAGNOSIS — E1122 Type 2 diabetes mellitus with diabetic chronic kidney disease: Secondary | ICD-10-CM | POA: Diagnosis not present

## 2023-06-27 DIAGNOSIS — E785 Hyperlipidemia, unspecified: Secondary | ICD-10-CM

## 2023-06-27 DIAGNOSIS — Z981 Arthrodesis status: Secondary | ICD-10-CM | POA: Diagnosis not present

## 2023-06-27 DIAGNOSIS — L97329 Non-pressure chronic ulcer of left ankle with unspecified severity: Secondary | ICD-10-CM | POA: Diagnosis not present

## 2023-06-27 DIAGNOSIS — G8918 Other acute postprocedural pain: Secondary | ICD-10-CM | POA: Diagnosis not present

## 2023-06-27 DIAGNOSIS — E11622 Type 2 diabetes mellitus with other skin ulcer: Secondary | ICD-10-CM | POA: Diagnosis not present

## 2023-06-27 DIAGNOSIS — N182 Chronic kidney disease, stage 2 (mild): Secondary | ICD-10-CM | POA: Diagnosis present

## 2023-06-27 DIAGNOSIS — Z89512 Acquired absence of left leg below knee: Secondary | ICD-10-CM | POA: Diagnosis present

## 2023-06-27 DIAGNOSIS — J45909 Unspecified asthma, uncomplicated: Secondary | ICD-10-CM

## 2023-06-27 DIAGNOSIS — Z86002 Personal history of in-situ neoplasm of other and unspecified genital organs: Secondary | ICD-10-CM

## 2023-06-27 DIAGNOSIS — M86272 Subacute osteomyelitis, left ankle and foot: Secondary | ICD-10-CM | POA: Diagnosis not present

## 2023-06-27 DIAGNOSIS — Z803 Family history of malignant neoplasm of breast: Secondary | ICD-10-CM

## 2023-06-27 DIAGNOSIS — Z882 Allergy status to sulfonamides status: Secondary | ICD-10-CM

## 2023-06-27 DIAGNOSIS — Z01818 Encounter for other preprocedural examination: Principal | ICD-10-CM

## 2023-06-27 DIAGNOSIS — N183 Chronic kidney disease, stage 3 unspecified: Secondary | ICD-10-CM | POA: Diagnosis not present

## 2023-06-27 DIAGNOSIS — Z8544 Personal history of malignant neoplasm of other female genital organs: Secondary | ICD-10-CM | POA: Diagnosis not present

## 2023-06-27 DIAGNOSIS — E1169 Type 2 diabetes mellitus with other specified complication: Secondary | ICD-10-CM | POA: Diagnosis not present

## 2023-06-27 DIAGNOSIS — I129 Hypertensive chronic kidney disease with stage 1 through stage 4 chronic kidney disease, or unspecified chronic kidney disease: Secondary | ICD-10-CM | POA: Diagnosis not present

## 2023-06-27 DIAGNOSIS — E1152 Type 2 diabetes mellitus with diabetic peripheral angiopathy with gangrene: Secondary | ICD-10-CM | POA: Diagnosis present

## 2023-06-27 DIAGNOSIS — I70261 Atherosclerosis of native arteries of extremities with gangrene, right leg: Secondary | ICD-10-CM | POA: Diagnosis present

## 2023-06-27 DIAGNOSIS — Z8249 Family history of ischemic heart disease and other diseases of the circulatory system: Secondary | ICD-10-CM

## 2023-06-27 DIAGNOSIS — L97529 Non-pressure chronic ulcer of other part of left foot with unspecified severity: Secondary | ICD-10-CM | POA: Diagnosis not present

## 2023-06-27 HISTORY — DX: Cardiac arrhythmia, unspecified: I49.9

## 2023-06-27 HISTORY — DX: Other complications of anesthesia, initial encounter: T88.59XA

## 2023-06-27 HISTORY — PX: AMPUTATION: SHX166

## 2023-06-27 HISTORY — DX: Other specified postprocedural states: Z98.890

## 2023-06-27 LAB — COMPREHENSIVE METABOLIC PANEL
ALT: 21 U/L (ref 0–44)
AST: 21 U/L (ref 15–41)
Albumin: 3.4 g/dL — ABNORMAL LOW (ref 3.5–5.0)
Alkaline Phosphatase: 60 U/L (ref 38–126)
Anion gap: 10 (ref 5–15)
BUN: 16 mg/dL (ref 6–20)
CO2: 23 mmol/L (ref 22–32)
Calcium: 9.1 mg/dL (ref 8.9–10.3)
Chloride: 107 mmol/L (ref 98–111)
Creatinine, Ser: 1.33 mg/dL — ABNORMAL HIGH (ref 0.44–1.00)
GFR, Estimated: 47 mL/min — ABNORMAL LOW (ref >60)
Glucose, Bld: 107 mg/dL — ABNORMAL HIGH (ref 70–99)
Potassium: 4.1 mmol/L (ref 3.5–5.1)
Sodium: 140 mmol/L (ref 135–145)
Total Bilirubin: 0.4 mg/dL (ref 0.3–1.2)
Total Protein: 7.4 g/dL (ref 6.5–8.1)

## 2023-06-27 LAB — CBC WITH DIFFERENTIAL/PLATELET
Abs Immature Granulocytes: 0.03 10*3/uL (ref 0.00–0.07)
Basophils Absolute: 0 10*3/uL (ref 0.0–0.1)
Basophils Relative: 0 %
Eosinophils Absolute: 0.1 10*3/uL (ref 0.0–0.5)
Eosinophils Relative: 2 %
HCT: 45.5 % (ref 36.0–46.0)
Hemoglobin: 15.3 g/dL — ABNORMAL HIGH (ref 12.0–15.0)
Immature Granulocytes: 0 %
Lymphocytes Relative: 24 %
Lymphs Abs: 2.3 10*3/uL (ref 0.7–4.0)
MCH: 32.5 pg (ref 26.0–34.0)
MCHC: 33.6 g/dL (ref 30.0–36.0)
MCV: 96.6 fL (ref 80.0–100.0)
Monocytes Absolute: 0.7 10*3/uL (ref 0.1–1.0)
Monocytes Relative: 7 %
Neutro Abs: 6.3 10*3/uL (ref 1.7–7.7)
Neutrophils Relative %: 67 %
Platelets: 322 10*3/uL (ref 150–400)
RBC: 4.71 MIL/uL (ref 3.87–5.11)
RDW: 15.1 % (ref 11.5–15.5)
WBC: 9.5 10*3/uL (ref 4.0–10.5)
nRBC: 0 % (ref 0.0–0.2)

## 2023-06-27 LAB — HEMOGLOBIN A1C
Hgb A1c MFr Bld: 7.7 % — ABNORMAL HIGH (ref 4.8–5.6)
Mean Plasma Glucose: 174.29 mg/dL

## 2023-06-27 LAB — GLUCOSE, CAPILLARY
Glucose-Capillary: 131 mg/dL — ABNORMAL HIGH (ref 70–99)
Glucose-Capillary: 109 mg/dL — ABNORMAL HIGH (ref 70–99)
Glucose-Capillary: 106 mg/dL — ABNORMAL HIGH (ref 70–99)

## 2023-06-27 LAB — PREALBUMIN: Prealbumin: 32 mg/dL (ref 18–38)

## 2023-06-27 SURGERY — AMPUTATION BELOW KNEE
Anesthesia: Regional | Site: Knee | Laterality: Left

## 2023-06-27 MED ORDER — ACETAMINOPHEN 325 MG PO TABS
325.0000 mg | ORAL_TABLET | Freq: Four times a day (QID) | ORAL | Status: DC | PRN
  Administered 2023-06-28 – 2023-07-01 (×3): 650 mg via ORAL
  Filled 2023-06-27 (×3): qty 2

## 2023-06-27 MED ORDER — CHLORHEXIDINE GLUCONATE 0.12 % MT SOLN
OROMUCOSAL | Status: AC
  Administered 2023-06-27: 15 mL via OROMUCOSAL
  Filled 2023-06-27: qty 15

## 2023-06-27 MED ORDER — ONDANSETRON HCL 4 MG/2ML IJ SOLN
4.0000 mg | Freq: Four times a day (QID) | INTRAMUSCULAR | Status: DC | PRN
  Administered 2023-06-27 – 2023-06-29 (×6): 4 mg via INTRAVENOUS
  Filled 2023-06-27 (×6): qty 2

## 2023-06-27 MED ORDER — MAGNESIUM SULFATE 2 GM/50ML IV SOLN: 2.0000 g | Freq: Every day | INTRAVENOUS | Status: DC | PRN

## 2023-06-27 MED ORDER — ORAL CARE MOUTH RINSE: 15.0000 mL | Freq: Once | OROMUCOSAL | Status: AC

## 2023-06-27 MED ORDER — METOPROLOL TARTRATE 5 MG/5ML IV SOLN: 2.0000 mg | INTRAVENOUS | Status: DC | PRN

## 2023-06-27 MED ORDER — GLIMEPIRIDE 2 MG PO TABS
2.0000 mg | ORAL_TABLET | Freq: Every day | ORAL | Status: DC
  Administered 2023-06-28 – 2023-07-01 (×4): 2 mg via ORAL
  Filled 2023-06-27 (×4): qty 1

## 2023-06-27 MED ORDER — CEFAZOLIN SODIUM-DEXTROSE 2-4 GM/100ML-% IV SOLN
INTRAVENOUS | Status: AC
  Filled 2023-06-27: qty 100

## 2023-06-27 MED ORDER — ROSUVASTATIN CALCIUM 5 MG PO TABS
5.0000 mg | ORAL_TABLET | Freq: Every day | ORAL | Status: DC
  Administered 2023-06-28 – 2023-06-30 (×3): 5 mg via ORAL
  Filled 2023-06-27 (×3): qty 1

## 2023-06-27 MED ORDER — DULAGLUTIDE 4.5 MG/0.5ML ~~LOC~~ SOAJ: 4.5000 mg | SUBCUTANEOUS | Status: DC

## 2023-06-27 MED ORDER — LACTATED RINGERS IV SOLN: INTRAVENOUS | Status: DC

## 2023-06-27 MED ORDER — CEFAZOLIN SODIUM-DEXTROSE 2-4 GM/100ML-% IV SOLN
2.0000 g | INTRAVENOUS | Status: AC
  Administered 2023-06-27: 2 g via INTRAVENOUS

## 2023-06-27 MED ORDER — PANTOPRAZOLE SODIUM 40 MG PO TBEC: 40.0000 mg | DELAYED_RELEASE_TABLET | Freq: Every day | ORAL | Status: DC

## 2023-06-27 MED ORDER — GUAIFENESIN-DM 100-10 MG/5ML PO SYRP: 15.0000 mL | ORAL_SOLUTION | ORAL | Status: DC | PRN

## 2023-06-27 MED ORDER — INSULIN ASPART 100 UNIT/ML IJ SOLN: 0.0000 [IU] | INTRAMUSCULAR | Status: DC | PRN

## 2023-06-27 MED ORDER — HYDROMORPHONE HCL 1 MG/ML IJ SOLN
0.5000 mg | INTRAMUSCULAR | Status: DC | PRN
  Administered 2023-06-27 – 2023-06-28 (×3): 1 mg via INTRAVENOUS
  Administered 2023-06-28: 0.5 mg via INTRAVENOUS
  Administered 2023-06-29 – 2023-06-30 (×4): 1 mg via INTRAVENOUS
  Filled 2023-06-27 (×8): qty 1

## 2023-06-27 MED ORDER — ONDANSETRON HCL 4 MG/2ML IJ SOLN
INTRAMUSCULAR | Status: AC
  Filled 2023-06-27: qty 2

## 2023-06-27 MED ORDER — DOLUTEGRAVIR-LAMIVUDINE 50-300 MG PO TABS
1.0000 | ORAL_TABLET | Freq: Every day | ORAL | Status: DC
  Administered 2023-06-28 – 2023-06-30 (×3): 1 via ORAL
  Filled 2023-06-27 (×6): qty 1

## 2023-06-27 MED ORDER — PHENYLEPHRINE 80 MCG/ML (10ML) SYRINGE FOR IV PUSH (FOR BLOOD PRESSURE SUPPORT)
PREFILLED_SYRINGE | INTRAVENOUS | Status: AC
  Filled 2023-06-27: qty 10

## 2023-06-27 MED ORDER — ROPIVACAINE HCL 5 MG/ML IJ SOLN
INTRAMUSCULAR | Status: DC | PRN
  Administered 2023-06-27: 20 mL via PERINEURAL

## 2023-06-27 MED ORDER — OXYCODONE HCL 5 MG PO TABS
5.0000 mg | ORAL_TABLET | ORAL | Status: DC | PRN
  Administered 2023-06-28: 10 mg via ORAL
  Filled 2023-06-27: qty 2

## 2023-06-27 MED ORDER — PROPOFOL 10 MG/ML IV BOLUS
INTRAVENOUS | Status: DC | PRN
  Administered 2023-06-27: 150 mg via INTRAVENOUS
  Administered 2023-06-27: 20 mg via INTRAVENOUS

## 2023-06-27 MED ORDER — SUCCINYLCHOLINE CHLORIDE 200 MG/10ML IV SOSY
PREFILLED_SYRINGE | INTRAVENOUS | Status: DC | PRN
  Administered 2023-06-27: 160 mg via INTRAVENOUS

## 2023-06-27 MED ORDER — INSULIN GLARGINE-YFGN 100 UNIT/ML ~~LOC~~ SOLN
10.0000 [IU] | Freq: Every day | SUBCUTANEOUS | Status: DC
  Administered 2023-06-27 – 2023-06-28 (×2): 10 [IU] via SUBCUTANEOUS
  Filled 2023-06-27 (×3): qty 0.1

## 2023-06-27 MED ORDER — LISINOPRIL-HYDROCHLOROTHIAZIDE 20-25 MG PO TABS: 1.0000 | ORAL_TABLET | Freq: Every day | ORAL | Status: DC

## 2023-06-27 MED ORDER — FENTANYL CITRATE (PF) 100 MCG/2ML IJ SOLN
INTRAMUSCULAR | Status: AC
  Administered 2023-06-27: 100 ug via INTRAVENOUS
  Filled 2023-06-27: qty 2

## 2023-06-27 MED ORDER — LISINOPRIL 20 MG PO TABS
20.0000 mg | ORAL_TABLET | Freq: Every day | ORAL | Status: DC
  Administered 2023-06-27 – 2023-07-01 (×5): 20 mg via ORAL
  Filled 2023-06-27 (×5): qty 1

## 2023-06-27 MED ORDER — ESMOLOL HCL 100 MG/10ML IV SOLN
INTRAVENOUS | Status: DC | PRN
  Administered 2023-06-27: 40 mg via INTRAVENOUS

## 2023-06-27 MED ORDER — 0.9 % SODIUM CHLORIDE (POUR BTL) OPTIME
TOPICAL | Status: DC | PRN
  Administered 2023-06-27: 1000 mL

## 2023-06-27 MED ORDER — DEXAMETHASONE SODIUM PHOSPHATE 10 MG/ML IJ SOLN
INTRAMUSCULAR | Status: AC
  Filled 2023-06-27: qty 1

## 2023-06-27 MED ORDER — FENTANYL CITRATE (PF) 100 MCG/2ML IJ SOLN: 100.0000 ug | Freq: Once | INTRAMUSCULAR | Status: AC

## 2023-06-27 MED ORDER — CEFAZOLIN SODIUM-DEXTROSE 2-4 GM/100ML-% IV SOLN
2.0000 g | Freq: Three times a day (TID) | INTRAVENOUS | Status: AC
  Administered 2023-06-27 (×2): 2 g via INTRAVENOUS
  Filled 2023-06-27 (×2): qty 100

## 2023-06-27 MED ORDER — BUPIVACAINE HCL (PF) 0.5 % IJ SOLN
INTRAMUSCULAR | Status: DC | PRN
  Administered 2023-06-27: 10 mL via PERINEURAL

## 2023-06-27 MED ORDER — TRANEXAMIC ACID-NACL 1000-0.7 MG/100ML-% IV SOLN
1000.0000 mg | INTRAVENOUS | Status: AC
  Administered 2023-06-27: 1000 mg via INTRAVENOUS

## 2023-06-27 MED ORDER — POLYETHYLENE GLYCOL 3350 17 G PO PACK
17.0000 g | PACK | Freq: Every day | ORAL | Status: DC | PRN
  Administered 2023-07-01: 17 g via ORAL
  Filled 2023-06-27: qty 1

## 2023-06-27 MED ORDER — GABAPENTIN 100 MG PO CAPS
100.0000 mg | ORAL_CAPSULE | Freq: Four times a day (QID) | ORAL | Status: DC
  Administered 2023-06-27 – 2023-07-01 (×14): 100 mg via ORAL
  Filled 2023-06-27 (×15): qty 1

## 2023-06-27 MED ORDER — POTASSIUM CHLORIDE CRYS ER 20 MEQ PO TBCR: 20.0000 meq | EXTENDED_RELEASE_TABLET | Freq: Every day | ORAL | Status: DC | PRN

## 2023-06-27 MED ORDER — MIDAZOLAM HCL 2 MG/2ML IJ SOLN: 2.0000 mg | Freq: Once | INTRAMUSCULAR | Status: AC

## 2023-06-27 MED ORDER — DOCUSATE SODIUM 100 MG PO CAPS
100.0000 mg | ORAL_CAPSULE | Freq: Every day | ORAL | Status: DC
  Administered 2023-06-28 – 2023-07-01 (×4): 100 mg via ORAL
  Filled 2023-06-27 (×4): qty 1

## 2023-06-27 MED ORDER — BISACODYL 5 MG PO TBEC
5.0000 mg | DELAYED_RELEASE_TABLET | Freq: Every day | ORAL | Status: DC | PRN
  Administered 2023-06-29: 5 mg via ORAL
  Filled 2023-06-27: qty 1

## 2023-06-27 MED ORDER — ONDANSETRON HCL 4 MG/2ML IJ SOLN
INTRAMUSCULAR | Status: DC | PRN
  Administered 2023-06-27: 4 mg via INTRAVENOUS

## 2023-06-27 MED ORDER — LIDOCAINE 2% (20 MG/ML) 5 ML SYRINGE
INTRAMUSCULAR | Status: DC | PRN
  Administered 2023-06-27: 100 mg via INTRAVENOUS

## 2023-06-27 MED ORDER — NORETHINDRONE ACETATE 5 MG PO TABS
5.0000 mg | ORAL_TABLET | Freq: Two times a day (BID) | ORAL | Status: DC
  Administered 2023-06-28 – 2023-07-01 (×8): 5 mg via ORAL
  Filled 2023-06-27 (×9): qty 1

## 2023-06-27 MED ORDER — VITAMIN C 500 MG PO TABS
1000.0000 mg | ORAL_TABLET | Freq: Every day | ORAL | Status: DC
  Administered 2023-06-27 – 2023-07-01 (×5): 1000 mg via ORAL
  Filled 2023-06-27 (×5): qty 2

## 2023-06-27 MED ORDER — OXYCODONE HCL 5 MG PO TABS
10.0000 mg | ORAL_TABLET | ORAL | Status: DC | PRN
  Administered 2023-06-27: 10 mg via ORAL
  Administered 2023-06-28 – 2023-06-29 (×3): 15 mg via ORAL
  Filled 2023-06-27 (×4): qty 3
  Filled 2023-06-27: qty 2

## 2023-06-27 MED ORDER — HYDROCHLOROTHIAZIDE 25 MG PO TABS
25.0000 mg | ORAL_TABLET | Freq: Every day | ORAL | Status: DC
  Administered 2023-06-27 – 2023-07-01 (×5): 25 mg via ORAL
  Filled 2023-06-27 (×5): qty 1

## 2023-06-27 MED ORDER — BUPIVACAINE LIPOSOME 1.3 % IJ SUSP
INTRAMUSCULAR | Status: DC | PRN
  Administered 2023-06-27: 10 mL via PERINEURAL

## 2023-06-27 MED ORDER — SODIUM CHLORIDE 0.9 % IV SOLN: INTRAVENOUS | Status: DC

## 2023-06-27 MED ORDER — LIDOCAINE 2% (20 MG/ML) 5 ML SYRINGE
INTRAMUSCULAR | Status: AC
  Filled 2023-06-27: qty 5

## 2023-06-27 MED ORDER — LABETALOL HCL 5 MG/ML IV SOLN
10.0000 mg | INTRAVENOUS | Status: DC | PRN
  Administered 2023-06-28: 10 mg via INTRAVENOUS
  Filled 2023-06-27: qty 4

## 2023-06-27 MED ORDER — JUVEN PO PACK
1.0000 | PACK | Freq: Two times a day (BID) | ORAL | Status: DC
  Administered 2023-06-27 – 2023-07-01 (×8): 1 via ORAL
  Filled 2023-06-27 (×8): qty 1

## 2023-06-27 MED ORDER — HYDRALAZINE HCL 20 MG/ML IJ SOLN: 5.0000 mg | INTRAMUSCULAR | Status: DC | PRN

## 2023-06-27 MED ORDER — MIDAZOLAM HCL 2 MG/2ML IJ SOLN
INTRAMUSCULAR | Status: AC
  Administered 2023-06-27: 2 mg via INTRAVENOUS
  Filled 2023-06-27: qty 2

## 2023-06-27 MED ORDER — PHENOL 1.4 % MT LIQD: 1.0000 | OROMUCOSAL | Status: DC | PRN

## 2023-06-27 MED ORDER — MAGNESIUM CITRATE PO SOLN: 1.0000 | Freq: Once | ORAL | Status: DC | PRN

## 2023-06-27 MED ORDER — TRANEXAMIC ACID-NACL 1000-0.7 MG/100ML-% IV SOLN
INTRAVENOUS | Status: AC
  Filled 2023-06-27: qty 100

## 2023-06-27 MED ORDER — PIOGLITAZONE HCL 30 MG PO TABS
30.0000 mg | ORAL_TABLET | ORAL | Status: DC
  Administered 2023-06-28 – 2023-07-01 (×4): 30 mg via ORAL
  Filled 2023-06-27 (×4): qty 1

## 2023-06-27 MED ORDER — ASPIRIN 81 MG PO TBEC
81.0000 mg | DELAYED_RELEASE_TABLET | Freq: Every day | ORAL | Status: DC
  Administered 2023-06-28 – 2023-07-01 (×4): 81 mg via ORAL
  Filled 2023-06-27 (×4): qty 1

## 2023-06-27 MED ORDER — ALUM & MAG HYDROXIDE-SIMETH 200-200-20 MG/5ML PO SUSP: 15.0000 mL | ORAL | Status: DC | PRN

## 2023-06-27 MED ORDER — PROPOFOL 500 MG/50ML IV EMUL
INTRAVENOUS | Status: DC | PRN
  Administered 2023-06-27: 50 ug/kg/min via INTRAVENOUS

## 2023-06-27 MED ORDER — ZINC SULFATE 220 (50 ZN) MG PO CAPS
220.0000 mg | ORAL_CAPSULE | Freq: Every day | ORAL | Status: DC
  Administered 2023-06-27 – 2023-07-01 (×5): 220 mg via ORAL
  Filled 2023-06-27 (×5): qty 1

## 2023-06-27 MED ORDER — CHLORHEXIDINE GLUCONATE 0.12 % MT SOLN: 15.0000 mL | Freq: Once | OROMUCOSAL | Status: AC

## 2023-06-27 MED ORDER — PANTOPRAZOLE SODIUM 20 MG PO TBEC
20.0000 mg | DELAYED_RELEASE_TABLET | ORAL | Status: DC
  Administered 2023-06-28 – 2023-07-01 (×4): 20 mg via ORAL
  Filled 2023-06-27 (×4): qty 1

## 2023-06-27 SURGICAL SUPPLY — 42 items
BAG COUNTER SPONGE SURGICOUNT (BAG) IMPLANT
BAG SPNG CNTER NS LX DISP (BAG)
BLADE SAW RECIP 87.9 MT (BLADE) ×1 IMPLANT
BLADE SURG 21 STRL SS (BLADE) ×1 IMPLANT
BNDG CMPR 5X6 CHSV STRCH STRL (GAUZE/BANDAGES/DRESSINGS)
BNDG COHESIVE 6X5 TAN ST LF (GAUZE/BANDAGES/DRESSINGS) IMPLANT
CANISTER WOUND CARE 500ML ATS (WOUND CARE) ×1 IMPLANT
CANISTER WOUNDNEG PRESSURE 500 (CANNISTER) IMPLANT
COVER SURGICAL LIGHT HANDLE (MISCELLANEOUS) ×1 IMPLANT
CUFF TOURN SGL QUICK 34 (TOURNIQUET CUFF) ×1
CUFF TRNQT CYL 34X4.125X (TOURNIQUET CUFF) ×1 IMPLANT
DRAPE DERMATAC (DRAPES) IMPLANT
DRAPE INCISE IOBAN 66X45 STRL (DRAPES) ×1 IMPLANT
DRAPE U-SHAPE 47X51 STRL (DRAPES) ×1 IMPLANT
DRESSING PREVENA PLUS CUSTOM (GAUZE/BANDAGES/DRESSINGS) ×1 IMPLANT
DRSG PREVENA PLUS CUSTOM (GAUZE/BANDAGES/DRESSINGS) ×1
DURAPREP 26ML APPLICATOR (WOUND CARE) ×1 IMPLANT
ELECT REM PT RETURN 9FT ADLT (ELECTROSURGICAL) ×1
ELECTRODE REM PT RTRN 9FT ADLT (ELECTROSURGICAL) ×1 IMPLANT
GLOVE BIOGEL PI IND STRL 9 (GLOVE) ×1 IMPLANT
GLOVE SURG ORTHO 9.0 STRL STRW (GLOVE) ×1 IMPLANT
GOWN STRL REUS W/ TWL XL LVL3 (GOWN DISPOSABLE) ×2 IMPLANT
GOWN STRL REUS W/TWL XL LVL3 (GOWN DISPOSABLE) ×2
GRAFT SKIN WND MICRO 38 (Tissue) IMPLANT
GRAFT SKIN WND OMEGA3 SB 7X10 (Tissue) IMPLANT
KIT BASIN OR (CUSTOM PROCEDURE TRAY) ×1 IMPLANT
KIT TURNOVER KIT B (KITS) ×1 IMPLANT
MANIFOLD NEPTUNE II (INSTRUMENTS) ×1 IMPLANT
NS IRRIG 1000ML POUR BTL (IV SOLUTION) ×1 IMPLANT
PACK ORTHO EXTREMITY (CUSTOM PROCEDURE TRAY) ×1 IMPLANT
PAD ARMBOARD 7.5X6 YLW CONV (MISCELLANEOUS) ×1 IMPLANT
PREVENA RESTOR ARTHOFORM 46X30 (CANNISTER) ×1 IMPLANT
SPONGE T-LAP 18X18 ~~LOC~~+RFID (SPONGE) IMPLANT
STAPLER VISISTAT 35W (STAPLE) IMPLANT
STOCKINETTE IMPERVIOUS LG (DRAPES) ×1 IMPLANT
SUT ETHILON 2 0 PSLX (SUTURE) IMPLANT
SUT SILK 2 0 (SUTURE) ×1
SUT SILK 2-0 18XBRD TIE 12 (SUTURE) ×1 IMPLANT
SUT VIC AB 1 CTX 27 (SUTURE) ×2 IMPLANT
TOWEL GREEN STERILE (TOWEL DISPOSABLE) ×1 IMPLANT
TUBE CONNECTING 12X1/4 (SUCTIONS) ×1 IMPLANT
YANKAUER SUCT BULB TIP NO VENT (SUCTIONS) ×1 IMPLANT

## 2023-06-27 NOTE — Anesthesia Procedure Notes (Signed)
Anesthesia Regional Block: Adductor canal block   Pre-Anesthetic Checklist: , timeout performed,  Correct Patient, Correct Site, Correct Laterality,  Correct Procedure, Correct Position, site marked,  Risks and benefits discussed,  Surgical consent,  Pre-op evaluation,  At surgeon's request and post-op pain management  Laterality: Right  Prep: chloraprep       Needles:  Injection technique: Single-shot  Needle Type: Echogenic Stimulator Needle     Needle Length: 5cm  Needle Gauge: 22     Additional Needles:   Procedures:,,,, ultrasound used (permanent image in chart),,    Narrative:  Start time: 06/27/2023 12:00 PM End time: 06/27/2023 12:05 PM Injection made incrementally with aspirations every 5 mL.  Performed by: Personally  Anesthesiologist: Bethena Midget, MD  Additional Notes: Functioning IV was confirmed and monitors were applied.  A 50mm 22ga Arrow echogenic stimulator needle was used. Sterile prep and drape,hand hygiene and sterile gloves were used. Ultrasound guidance: relevant anatomy identified, needle position confirmed, local anesthetic spread visualized around nerve(s)., vascular puncture avoided.  Image printed for medical record. Negative aspiration and negative test dose prior to incremental administration of local anesthetic. The patient tolerated the procedure well.

## 2023-06-27 NOTE — Progress Notes (Signed)
Orthopedic Tech Progress Note Patient Details:  Katrina Wolfe 1966-04-27 [id number]  Patient has AMPUSHIELD BK    Patient ID: Katrina Wolfe, female   DOB: [birth year], 57 y.o.   MRN: [id number]  [name] 06/27/2023, 3:02 PM

## 2023-06-27 NOTE — Op Note (Signed)
06/27/2023  3:07 PM  PATIENT:  Katrina Wolfe    PRE-OPERATIVE DIAGNOSIS:  Charcot Collapse Left Ankle with osteomyelitis  POST-OPERATIVE DIAGNOSIS:  Same  PROCEDURE:  LEFT BELOW KNEE AMPUTATION Application of Kerecis micro graft 38 cm and Kerecis sheet 7 x 10 cm. Application of Prevena customizable and Prevena arthroform wound VAC dressings Application of Vive Wear stump shrinker and the Hanger limb protector  SURGEON:  Nadara Mustard, MD  ANESTHESIA:   General  PREOPERATIVE INDICATIONS:  Katrina Wolfe is a  57 y.o. female with a diagnosis of Charcot Collapse Left Ankle who failed conservative measures and elected for surgical management.    The risks benefits and alternatives were discussed with the patient preoperatively including but not limited to the risks of infection, bleeding, nerve injury, cardiopulmonary complications, the need for revision surgery, among others, and the patient was willing to proceed.  OPERATIVE IMPLANTS:   Implant Name Type Inv. Item Serial No. Manufacturer Lot No. LRB No. Used Action  GRAFT SKIN WND OMEGA3 SB 7X10 - [id number] Tissue GRAFT SKIN WND OMEGA3 SB 7X10  [employer] [id number] Left 1 Implanted  GRAFT SKIN WND MICRO 38 - [id number] Tissue GRAFT SKIN WND MICRO 38  [employer] [id number] Left 1 Implanted     OPERATIVE FINDINGS: calcified arteries  OPERATIVE PROCEDURE: Patient was brought to the operating room after undergoing a regional anesthetic.  After adequate levels anesthesia were obtained a thigh tourniquet was placed and the lower extremity was prepped using DuraPrep draped into a sterile field. The foot was draped out of the sterile field with impervious stockinette.  A timeout was called and the tourniquet inflated.  A transverse skin incision was made 12 cm distal to the tibial tubercle, the incision curved proximally, and a large posterior flap was created.  The tibia was transected just proximal to the skin incision and beveled  anteriorly.  The fibula was transected just proximal to the tibial incision.  The sciatic nerve was pulled cut and allowed to retract.  The vascular bundles were suture ligated with 2-0 silk.  The tourniquet was deflated and hemostasis obtained.      The Kerecis micro powder 38 cm was applied to the open wound that has a 200 cm surface area.  The 7 x 10 cm Kerecis sheet was then folded and secured to the distal tibia and fibula with #1 Vicryl.   The deep and superficial fascial layers were closed using #1 Vicryl.  The skin was closed using staples.    The Prevena customizable dressing was applied this was overwrapped with the arthroform sponge.  Katrina Wolfe was used to secure the sponges and the circumferential compression was secured to the skin with Dermatac.  This was connected to the wound VAC pump and had a good suction fit this was covered with a stump shrinker and a limb protector.  Patient was taken to the PACU in stable condition.   DISCHARGE PLANNING:  Antibiotic duration: 24-hour antibiotics  Weightbearing: Nonweightbearing on the operative extremity  Pain medication: Opioid pathway  Dressing care/ Wound VAC: Continue wound VAC with the Prevena plus pump at discharge for 1 week  Ambulatory devices: Walker or kneeling scooter  Discharge to: Discharge planning based on recommendations per physical therapy  Follow-up: In the office 1 week after discharge.

## 2023-06-27 NOTE — Transfer of Care (Signed)
Immediate Anesthesia Transfer of Care Note  Patient: Katrina Wolfe  Procedure(s) Performed: LEFT BELOW KNEE AMPUTATION (Left: Knee)  Patient Location: PACU  Anesthesia Type:General and Regional  Level of Consciousness: awake, alert , and oriented  Airway & Oxygen Therapy: Patient Spontanous Breathing  Post-op Assessment: Report given to RN, Post -op Vital signs reviewed and stable, and Patient moving all extremities  Post vital signs: stable  Last Vitals:  Vitals Value Taken Time  BP 141/89 06/27/23 1500  Temp 36.9 C 06/27/23 1500  Pulse 83 06/27/23 1511  Resp 16 06/27/23 1511  SpO2 98 % 06/27/23 1511  Vitals shown include unvalidated device data.  Last Pain:  Vitals:   06/27/23 1500  TempSrc:   PainSc: 0-No pain         Complications:  Encounter Notable Events  Notable Event Outcome Phase Comment  Difficult to intubate - unexpected  In Recovery Filed from anesthesia note documentation.

## 2023-06-27 NOTE — Anesthesia Procedure Notes (Addendum)
Anesthesia Regional Block: Popliteal block   Pre-Anesthetic Checklist: , timeout performed,  Correct Patient, Correct Site, Correct Laterality,  Correct Procedure, Correct Position, site marked,  Risks and benefits discussed,  Surgical consent,  Pre-op evaluation,  At surgeon's request and post-op pain management  Laterality: Left  Prep: chloraprep       Needles:  Injection technique: Single-shot  Needle Type: Echogenic Stimulator Needle     Needle Length: 5cm  Needle Gauge: 22     Additional Needles:   Procedures:, nerve stimulator,,, ultrasound used (permanent image in chart),,     Nerve Stimulator or Paresthesia:  Response: foot, 0.45 mA  Additional Responses:   Narrative:  Start time: 06/27/2023 11:55 AM End time: 06/27/2023 12:00 PM Injection made incrementally with aspirations every 5 mL.  Performed by: Personally  Anesthesiologist: Bethena Midget, MD  Additional Notes: Functioning IV was confirmed and monitors were applied.  A 50mm 22ga Arrow echogenic stimulator needle was used. Sterile prep and drape,hand hygiene and sterile gloves were used. Ultrasound guidance: relevant anatomy identified, needle position confirmed, local anesthetic spread visualized around nerve(s)., vascular puncture avoided.  Image printed for medical record. Negative aspiration and negative test dose prior to incremental administration of local anesthetic. The patient tolerated the procedure well.

## 2023-06-27 NOTE — Interval H&P Note (Signed)
History and Physical Interval Note:  06/27/2023 10:25 AM  Katrina Wolfe  has presented today for surgery, with the diagnosis of Charcot Collapse Left Ankle.  The various methods of treatment have been discussed with the patient and family. After consideration of risks, benefits and other options for treatment, the patient has consented to  Procedure(s): LEFT BELOW KNEE AMPUTATION (Left) as a surgical intervention.  The patient's history has been reviewed, patient examined, no change in status, stable for surgery.  I have reviewed the patient's chart and labs.  Questions were answered to the patient's satisfaction.     Nadara Mustard

## 2023-06-27 NOTE — Anesthesia Procedure Notes (Signed)
Procedure Name: Intubation Date/Time: 06/27/2023 2:32 PM  Performed by: Kayleen Memos, CRNAPre-anesthesia Checklist: Patient identified, Emergency Drugs available, Suction available, Patient being monitored and Timeout performed Patient Re-evaluated:Patient Re-evaluated prior to induction Oxygen Delivery Method: Circle system utilized Induction Type: IV induction and Rapid sequence Ventilation: Mask ventilation with difficulty and Two handed mask ventilation required Laryngoscope Size: Glidescope and 4 Grade View: Grade II Tube type: Oral Tube size: 7.5 mm Number of attempts: 1 Airway Equipment and Method: Rigid stylet and Video-laryngoscopy Placement Confirmation: ETT inserted through vocal cords under direct vision, positive ETCO2, CO2 detector and breath sounds checked- equal and bilateral Secured at: 26 cm Tube secured with: Tape Dental Injury: Teeth and Oropharynx as per pre-operative assessment  Difficulty Due To: Difficulty was unanticipated and Difficult Airway- due to anterior larynx

## 2023-06-27 NOTE — Progress Notes (Signed)
Pt. Unable to bring Trulicity injection from home due to back order at pharmacy for two months. Pharmacy notified

## 2023-06-27 NOTE — Plan of Care (Signed)

## 2023-06-27 NOTE — H&P (Signed)
Katrina Wolfe is an 57 y.o. female.   Chief Complaint: progressive instability and pain left ankle HPI: Patient is a 57 year old woman is seen in follow-up status post revision ankle fusion for Charcot collapse. Patient states she has been having increasing deformity and laxity. She is currently on doxycycline.   Past Medical History:  Diagnosis Date   Abnormal uterine bleeding (AUB)    Arthritis    knees, fingers   Asthma    as child, no problem since 1995   Bell's palsy 2004   CKD (chronic kidney disease), stage II    Complication of anesthesia    Congenital cardiomegaly    per pt has always been told since childhood and siblings    Diabetes mellitus without complication (HCC)    Dysrhythmia    Occasionally skipped beats   Elevated liver function tests    GERD (gastroesophageal reflux disease)    History of genital warts    HIV (human immunodeficiency virus infection) (HCC)    DX 1998--  MONITORED BY INFECTIOUS DISEASE-- DR Judyann Munson   Hypercholesterolemia    Hypertension    Intermittent palpitations    mild-- no meds   Legally blind in left eye, as defined in [place]    trauma as child   Peripheral vascular disease (HCC)    Pneumonia    PONV (postoperative nausea and vomiting)    Renal disorder    Tuberculosis    ? 2005 or 2007   Type 2 diabetes mellitus (HCC)    Type II    Uterine fibroid    VIN III (vulvar intraepithelial neoplasia III)    and Verrucoid lesion on the mons   Wears glasses     Past Surgical History:  Procedure Laterality Date   ANKLE FUSION Left 02/21/2023   Procedure: REVISION LEFT ANKLE FUSION;  Surgeon: Nadara Mustard, MD;  Location: [facility] OR;  Service: Orthopedics;  Laterality: Left;   CESAREAN SECTION  2001  &  1984   CO2 LASER APPLICATION N/A 11/26/2015   Procedure: CO2 LASER APPLICATION AND WIDE VOCAL EXCSION OF LESION ON MONS PUBIS;  Surgeon: De Blanch, MD;  Location: [facility];  Service: Gynecology;  Laterality:  N/A;   CASE CANCELLED    CO2 LASER APPLICATION N/A 01/07/2016   Procedure: CO2 LASER OF VULVAR;  Surgeon: De Blanch, MD;  Location: [facility];  Service: Gynecology;  Laterality: N/A;   CORNEAL TRANSPLANT Left 12/18/2004   failed   D & C HYSTEROSCOPY /  RESECTION FIBROID  12/18/2002   DILATATION & CURETTAGE/HYSTEROSCOPY WITH MYOSURE N/A 10/01/2019   Procedure: DILATATION & CURETTAGE/HYSTEROSCOPY WITH MYOSURE and HYDROTHERMAL ABLATION;  Surgeon: Geryl Rankins, MD;  Location: [facility];  Service: Gynecology;  Laterality: N/A;  [employer] rep will be here. Confirmed on 09/25/19 CS   EYE SURGERY     FOOT ARTHRODESIS Left 11/30/2021   Procedure: LEFT TIBIOCALCANEAL FUSION;  Surgeon: Nadara Mustard, MD;  Location: [facility] OR;  Service: Orthopedics;  Laterality: Left;   HARDWARE REMOVAL Left 04/04/2019   Procedure: EXCISION BONE AND REMOVE DEEP HARDWARE LEFT 5TH METATARSAL;  Surgeon: Nadara Mustard, MD;  Location: [facility] OR;  Service: Orthopedics;  Laterality: Left;   HARDWARE REMOVAL Left 08/04/2022   Procedure: REMOVAL DEEP HARDWARE LEFT ANKLE;  Surgeon: Nadara Mustard, MD;  Location: [facility] OR;  Service: Orthopedics;  Laterality: Left;   I & D EXTREMITY Left 06/27/2021   Procedure: IRRIGATION AND DEBRIDEMENT FOOT ULCER;  Surgeon:  Kerrin Champagne, MD;  Location: [facility] OR;  Service: Orthopedics;  Laterality: Left;   KNEE ARTHROSCOPY W/ ACL RECONSTRUCTION Bilateral 12/18/2006   ORIF TOE FRACTURE Left 08/02/2017   Procedure: OPEN REDUCTION INTERNAL FIXATION (ORIF) FIFTH METATARSAL (TOE) BASE FRACTURE;  Surgeon: Toni Arthurs, MD;  Location: [facility];  Service: Orthopedics;  Laterality: Left;   VULVECTOMY N/A 01/07/2016   Procedure: WIDE LOCAL EXCISION;  Surgeon: De Blanch, MD;  Location: [facility];  Service: Gynecology;  Laterality: N/A;  mons pubis as site    Family History  Problem Relation Age of Onset   Hypertension Mother     Diabetes Father    Cancer Brother    Breast cancer Cousin    Social History:  reports that she has never smoked. She has never used smokeless tobacco. She reports that she does not drink alcohol and does not use drugs.  Allergies:  Allergies  Allergen Reactions   Shellfish Allergy Anaphylaxis    All types shellfish   Bactrim [Sulfamethoxazole-Trimethoprim] Hives    No medications prior to admission.    No results found for this or any previous visit (from the past 48 hour(s)). No results found.  Review of Systems  All other systems reviewed and are negative.   Height 5' 9.5" (1.765 m), weight 119.7 kg, last menstrual period 04/27/2021. Physical Exam   Patient is alert, oriented, no adenopathy, well-dressed, normal affect, normal respiratory effort. Examination patient has had progressive varus collapse of the hindfoot.  She has recurrent lateral foot ulceration.  The fusion site is unstable and mobile. Assessment/Plan 1. H/O ankle fusion   2. Charcot's arthropathy associated with type 2 diabetes mellitus (HCC)       Plan: With the progressive Charcot collapse and failure of the hardware do not feel there are any further foot salvage intervention options.  Have recommended proceeding with a transtibial amputation.  Patient states she understands wished to proceed at this time she would like to proceed around July 8.  Nadara Mustard, MD 06/27/2023, 6:47 AM

## 2023-06-28 ENCOUNTER — Encounter (HOSPITAL_COMMUNITY): Payer: Self-pay | Admitting: Orthopedic Surgery

## 2023-06-28 LAB — CBC
HCT: 37.7 % (ref 36.0–46.0)
Hemoglobin: 12.6 g/dL (ref 12.0–15.0)
MCH: 31.8 pg (ref 26.0–34.0)
MCHC: 33.4 g/dL (ref 30.0–36.0)
MCV: 95.2 fL (ref 80.0–100.0)
Platelets: 331 10*3/uL (ref 150–400)
RBC: 3.96 MIL/uL (ref 3.87–5.11)
RDW: 15.2 % (ref 11.5–15.5)
WBC: 13.9 10*3/uL — ABNORMAL HIGH (ref 4.0–10.5)
nRBC: 0 % (ref 0.0–0.2)

## 2023-06-28 LAB — BASIC METABOLIC PANEL
Anion gap: 10 (ref 5–15)
BUN: 18 mg/dL (ref 6–20)
CO2: 19 mmol/L — ABNORMAL LOW (ref 22–32)
Calcium: 8.4 mg/dL — ABNORMAL LOW (ref 8.9–10.3)
Chloride: 105 mmol/L (ref 98–111)
Creatinine, Ser: 1.51 mg/dL — ABNORMAL HIGH (ref 0.44–1.00)
GFR, Estimated: 40 mL/min — ABNORMAL LOW (ref >60)
Glucose, Bld: 270 mg/dL — ABNORMAL HIGH (ref 70–99)
Potassium: 4.2 mmol/L (ref 3.5–5.1)
Sodium: 134 mmol/L — ABNORMAL LOW (ref 135–145)

## 2023-06-28 NOTE — Evaluation (Signed)
Occupational Therapy Evaluation Patient Details Name: Katrina Wolfe MRN: [id number] DOB: Dec 06, 1966 Today's Date: 06/28/2023   History of Present Illness The pt is a 57 yo female presenting 7/10 for L BKA to manage charcot collapse of L ankle. PMH includes: arthritis, asthma, Bell's palsy, CKD II, DM II, HIV, HTN, PVD, blind in L eye, and multiple previous L ankle surgeries.   Clinical Impression   Pt admitted for above dx, PTA pt reports being Mod I in bADLs and ambulating using w/c, she lives with her family members. Pt currently s/p L BKA and presents with impaired balance, she is unable to perform stand pivots on her RLE due to pain with pressure and consistent buckling. Pt min guard for lateral scoots and needs min guard setup for bADLs in sitting. Reinforced ampushield use and the relief of compressive devices periodically to prevent skin breakdown.  Pt would benefit from continued acute skilled OT services to address deficits and help transition to next level of care. Patient has the potential to reach Mod I and demos the ability to tolerate 3 hours of therapy. Pt would benefit from an intensive rehab program to help maximize functional independence.      Recommendations for follow up therapy are one component of a multi-disciplinary discharge planning process, led by the attending physician.  Recommendations may be updated based on patient status, additional functional criteria and insurance authorization.   Assistance Recommended at Discharge Frequent or constant Supervision/Assistance  Patient can return home with the following A little help with walking and/or transfers;Assistance with cooking/housework;Help with stairs or ramp for entrance;Assist for transportation;A little help with bathing/dressing/bathroom    Functional Status Assessment  Patient has had a recent decline in their functional status and demonstrates the ability to make significant improvements in function in a  reasonable and predictable amount of time.  Equipment Recommendations  Other (comment) (TBD at next level of care)    Recommendations for Other Services Rehab consult     Precautions / Restrictions Precautions Precautions: Fall Precaution Comments: L BKA, R knee buckles Restrictions Weight Bearing Restrictions: Yes LLE Weight Bearing: Non weight bearing      Mobility Bed Mobility Overal bed mobility: Needs Assistance Bed Mobility: Sit to Supine       Sit to supine: Min guard   General bed mobility comments: Pt completing scooting EOB and in bed with min guard    Transfers Overall transfer level: Needs assistance Equipment used: None Transfers: Bed to chair/wheelchair/BSC            Lateral/Scoot Transfers: Min guard        Balance Overall balance assessment: Needs assistance Sitting-balance support: Bilateral upper extremity supported, Feet supported Sitting balance-Leahy Scale: Fair                                     ADL either performed or assessed with clinical judgement   ADL Overall ADL's : Needs assistance/impaired Eating/Feeding: Independent;Sitting   Grooming: Sitting;Min guard   Upper Body Bathing: Modified independent;Sitting   Lower Body Bathing: Sitting/lateral leans;Set up;Min guard   Upper Body Dressing : Modified independent;Sitting   Lower Body Dressing: Sitting/lateral leans;Min guard Lower Body Dressing Details (indicate cue type and reason): Reinforced education on donning/doffing ampushield   Toilet Transfer Details (indicate cue type and reason): NT   Toileting - Clothing Manipulation Details (indicate cue type and reason): NT   Tub/Shower Transfer Details (indicate  cue type and reason): NT         Vision   Additional Comments: Blind in L eye at baseline     Perception     Praxis      Pertinent Vitals/Pain Pain Assessment Pain Assessment: 0-10 Pain Score: 3  Pain Location: incision Pain  Descriptors / Indicators: Discomfort, Tightness Pain Intervention(s): Monitored during session, Repositioned     Hand Dominance Right   Extremity/Trunk Assessment Upper Extremity Assessment Upper Extremity Assessment: Overall WFL for tasks assessed   Lower Extremity Assessment Lower Extremity Assessment: RLE deficits/detail;LLE deficits/detail RLE Deficits / Details: pt with good strength at hip in all directions, and good ROM and strength at ankle to MMT. pt with inability to achieve R knee extension and demos minimal strength in quad to maintain knee extension. pt reports due to pain in knee after surgery in 2008, is unable to bear weight and when she does it buckles. The pt also has increased separation between inferior aspect of patella and tibia. patella is able to move without pain. RLE: Unable to fully assess due to pain RLE Sensation: WNL RLE Coordination: WNL LLE Deficits / Details: good strength at hip and WFL for ROM at hip and knee. grossly 4/5 to MMT LLE: Unable to fully assess due to immobilization (limb protector)   Cervical / Trunk Assessment Cervical / Trunk Assessment: Normal;Other exceptions Cervical / Trunk Exceptions: large body habitus   Communication Communication Communication: No difficulties   Cognition Arousal/Alertness: Awake/alert Behavior During Therapy: WFL for tasks assessed/performed Overall Cognitive Status: Within Functional Limits for tasks assessed                                       General Comments  pt reports dizziness after initial transition to long-sitting, after return to supine BP 154/78 (101) and dropped to 143/97 (108) after transition to sitting. low of 93/80 (83) with pt sitting in her recliner but recovered to 144/86 by end of session    Exercises     Shoulder Instructions      Home Living Family/patient expects to be discharged to:: Private residence Living Arrangements: Children;Other relatives (2 adult  sons and 3 grandkids (22, 71, and 33)) Available Help at Discharge: Family;Available 24 hours/day Type of Home: House Home Access: Stairs to enter [employer] of Steps: 2-3 (family is working on a ramp) Science writer: None Home Layout: One level     Bathroom Shower/Tub: Chief Strategy Officer: Handicapped height Bathroom Accessibility: Yes   Home Equipment: Agricultural consultant (2 wheels);Wheelchair - manual;BSC/3in1   Additional Comments: pt reports her toilet is close enough to tub for her to slide over onto the edge of tub      Prior Functioning/Environment Prior Level of Function : Independent/Modified Independent             Mobility Comments: scooting to WC, RLE too painful for wt bearing. self-propell ADLs Comments: pt transfers from [facility] to toilet to tub wall and then sits on floor of shower and uses hand held shower head. Mod I with ADLs        OT Problem List: Impaired balance (sitting and/or standing)      OT Treatment/Interventions: Self-care/ADL training;Therapeutic activities;Therapeutic exercise;Patient/family education;Balance training    OT Goals(Current goals can be found in the care plan section) Acute Rehab OT Goals Patient Stated Goal: get stronger in rehab OT Goal  Formulation: With patient Time For Goal Achievement: 07/12/23 Potential to Achieve Goals: Good ADL Goals Pt Will Perform Grooming: sitting;with set-up;with modified independence Pt Will Perform Lower Body Dressing: with supervision;sitting/lateral leans Pt Will Transfer to Toilet: squat pivot transfer;bedside commode;with supervision;with set-up Additional ADL Goal #2: pt will don/doff ampushield independently while sitting EOB to protect residual limb in preparation for functional mobility  OT Frequency: Min 1X/week    Co-evaluation              AM-PAC OT "6 Clicks" Daily Activity     Outcome Measure Help from another person eating meals?: None Help  from another person taking care of personal grooming?: A Little Help from another person toileting, which includes using toliet, bedpan, or urinal?: A Little Help from another person bathing (including washing, rinsing, drying)?: A Little Help from another person to put on and taking off regular upper body clothing?: None Help from another person to put on and taking off regular lower body clothing?: A Little 6 Click Score: 20   End of Session Equipment Utilized During Treatment: Gait belt;Rolling walker (2 wheels) Nurse Communication: Mobility status  Activity Tolerance: Patient tolerated treatment well Patient left: in bed;with call bell/phone within reach  OT Visit Diagnosis: Unsteadiness on feet (R26.81);Other abnormalities of gait and mobility (R26.89)                Time: 4098-1191 OT Time Calculation (min): 15 min Charges:  OT General Charges $OT Visit: 1 Visit OT Evaluation $OT Eval Moderate Complexity: 1 Mod  06/28/2023  AB, OTR/L  Acute Rehabilitation Services  Office: [phone]   Tristan Schroeder 06/28/2023, 12:41 PM

## 2023-06-28 NOTE — Evaluation (Signed)
Physical Therapy Evaluation Patient Details Name: Katrina Wolfe MRN: [id number] DOB: 06-16-1966 Today's Date: 06/28/2023  History of Present Illness  The pt is a 57 yo female presenting 7/10 for L BKA to manage charcot collapse of L ankle. PMH includes: arthritis, asthma, Bell's palsy, CKD II, DM II, HIV, HTN, PVD, blind in L eye, and multiple previous L ankle surgeries.   Clinical Impression  Pt in bed upon arrival of PT, agreeable to evaluation at this time. Prior to admission the pt was independent with transfers, using LLE as "good" leg to stabilize with transfers from [facility]. The pt lives with 2 adult children and 3 teenage grandchildren in a home with 2-3 steps to enter. She reports R knee buckling whenever she attempts to stand, and therefore has been confined to mobility in [facility] primarily. The pt was able to complete good ROM exercises with LLE and demos good strength at hip. She required minG-minA to complete bed mobility and transfer to [facility] this session. She is able to use her RLE to assist with small hip clearance for lateral scooting, but required increased time and minA to complete movement as well as to re-position in WC. Pt hopeful to improve functional strength in RLE as well as strength and ROM in LLE to prepare for prosthetic. Would benefit from intensive therapies after d/c to maximize functional independence and prepare best for eventual prosthetic training.    VITALS:  - supine in bed - BP: 154/78 (101); HR: 87bpm - sitting EOB - BP: 143/97 (108); HR: 86bpm - sitting in WC after transfer - BP: 93/80 (86); HR: 91bpm - sitting in WC, end of session - BP: 144/86 (102); HR: 83bpm    Assistance Recommended at Discharge Frequent or constant Supervision/Assistance  If plan is discharge home, recommend the following:  Can travel by private vehicle  Two people to help with walking and/or transfers;A little help with bathing/dressing/bathroom;Assistance with  cooking/housework;Help with stairs or ramp for entrance;Assist for transportation        Equipment Recommendations None recommended by PT  Recommendations for Other Services  Rehab consult    Functional Status Assessment Patient has had a recent decline in their functional status and demonstrates the ability to make significant improvements in function in a reasonable and predictable amount of time.     Precautions / Restrictions Precautions Precautions: Fall Precaution Comments: L BKA Restrictions Weight Bearing Restrictions: Yes LLE Weight Bearing: Non weight bearing      Mobility  Bed Mobility Overal bed mobility: Needs Assistance Bed Mobility: Rolling, Supine to Sit Rolling: Min guard   Supine to sit: Min guard     General bed mobility comments: minG with use of bed rails, can pull to long-sitting or pivot to sitting EOB with minG and increased time    Transfers Overall transfer level: Needs assistance Equipment used: None Transfers: Bed to chair/wheelchair/BSC            Lateral/Scoot Transfers: Min assist General transfer comment: minA to complete lateral scoot to R side. pt with RLE on the ground and is able to use slightly for hip clearance. predominantly using UE to create movement. minA to manage pad and complete movement.    Ambulation/Gait               General Gait Details: pt not ambulating at baseline for safety as she reports R knee buckling when standing     Balance Overall balance assessment: Needs assistance Sitting-balance support: Bilateral upper extremity supported, Feet  supported Sitting balance-Leahy Scale: Fair                                       Pertinent Vitals/Pain Pain Assessment Pain Assessment: Faces Faces Pain Scale: Hurts a little bit Pain Location: incision Pain Descriptors / Indicators: Discomfort, Tightness Pain Intervention(s): Limited activity within patient's tolerance, Monitored during  session, Repositioned    Home Living Family/patient expects to be discharged to:: Private residence Living Arrangements: Children;Other relatives (2 adult sons and 3 grandkids (22, 59, and 52)) Available Help at Discharge: Family;Available 24 hours/day Type of Home: House Home Access: Stairs to enter Entrance Stairs-Rails: None Entrance Stairs-Number of Steps: 2-3 (family is working on a ramp)   Home Layout: One level        Prior Function Prior Level of Function : Independent/Modified Independent             Mobility Comments: scooting to WC, RLE too painful for wt bearing. self-propell ADLs Comments: pt transfers from [facility] to toilet to tub wall and then sits on floor of shower and uses hand held shower head. independent with ADLs     Hand Dominance   Dominant Hand: Right    Extremity/Trunk Assessment   Upper Extremity Assessment Upper Extremity Assessment: Defer to OT evaluation;Overall WFL for tasks assessed    Lower Extremity Assessment Lower Extremity Assessment: RLE deficits/detail;LLE deficits/detail RLE Deficits / Details: pt with good strength at hip in all directions, and good ROM and strength at ankle to MMT. pt with inability to achieve R knee extension and demos minimal strength in quad to maintain knee extension. pt reports due to pain in knee after surgery in 2008, is unable to bear weight and when she does it buckles. The pt also has increased separation between inferior aspect of patella and tibia. patella is able to move without pain. RLE: Unable to fully assess due to pain RLE Sensation: WNL RLE Coordination: WNL LLE Deficits / Details: good strength at hip and WFL for ROM at hip and knee. grossly 4/5 to MMT LLE: Unable to fully assess due to immobilization (limb protector)    Cervical / Trunk Assessment Cervical / Trunk Assessment: Normal;Other exceptions Cervical / Trunk Exceptions: large body habitus  Communication   Communication: No difficulties   Cognition Arousal/Alertness: Awake/alert Behavior During Therapy: WFL for tasks assessed/performed Overall Cognitive Status: Within Functional Limits for tasks assessed                                          General Comments General comments (skin integrity, edema, etc.): pt reports dizziness after initial transition to long-sitting, after return to supine BP 154/78 (101) and dropped to 143/97 (108) after transition to sitting. low of 93/80 (83) with pt sitting in her recliner but recovered to 144/86 by end of session    Exercises Amputee Exercises Quad Sets: AROM, Left, 10 reps, Seated Hip ABduction/ADduction: AROM, Left, 5 reps, Supine Chair Push Up: AROM, 5 reps   Assessment/Plan    PT Assessment Patient needs continued PT services  PT Problem List Decreased strength;Decreased range of motion;Decreased activity tolerance;Decreased balance;Decreased mobility;Pain       PT Treatment Interventions DME instruction;Gait training;Functional mobility training;Therapeutic activities;Therapeutic exercise;Balance training;Patient/family education    PT Goals (Current goals can be found in the Care  Plan section)  Acute Rehab PT Goals Patient Stated Goal: return home after rehab, improve strength in RLE PT Goal Formulation: With patient Time For Goal Achievement: 07/12/23 Potential to Achieve Goals: Good    Frequency Min 3X/week        AM-PAC PT "6 Clicks" Mobility  Outcome Measure Help needed turning from your back to your side while in a flat bed without using bedrails?: A Little Help needed moving from lying on your back to sitting on the side of a flat bed without using bedrails?: A Little Help needed moving to and from a bed to a chair (including a wheelchair)?: A Little Help needed standing up from a chair using your arms (e.g., wheelchair or bedside chair)?: Total Help needed to walk in hospital room?: Total Help needed climbing 3-5 steps with a railing?  : Total 6 Click Score: 12    End of Session Equipment Utilized During Treatment: Other (comment) (limb protector) Activity Tolerance: Patient tolerated treatment well Patient left: in chair;with call bell/phone within reach (personal WC) Nurse Communication: Mobility status PT Visit Diagnosis: Other abnormalities of gait and mobility (R26.89);History of falling (Z91.81);Muscle weakness (generalized) (M62.81);Pain Pain - Right/Left: Left Pain - part of body: Leg    Time: 1610-9604 PT Time Calculation (min) (ACUTE ONLY): 44 min   Charges:   PT Evaluation $PT Eval Low Complexity: 1 Low PT Treatments $Therapeutic Exercise: 8-22 mins $Therapeutic Activity: 8-22 mins PT General Charges $$ ACUTE PT VISIT: 1 Visit         Vickki Muff, PT, DPT   Acute Rehabilitation Department Office [phone] Secure Chat Communication Preferred  Ronnie Derby 06/28/2023, 11:21 AM

## 2023-06-28 NOTE — Inpatient Diabetes Management (Signed)
Inpatient Diabetes Program Recommendations  AACE/ADA: New Consensus Statement on Inpatient Glycemic Control (2015)  Target Ranges:  Prepandial:   less than 140 mg/dL      Peak postprandial:   less than 180 mg/dL (1-2 hours)      Critically ill patients:  140 - 180 mg/dL   Lab Results  Component Value Date   GLUCAP 106 (H) 06/27/2023   HGBA1C 7.7 (H) 06/27/2023    Review of Glycemic Control  Latest Reference Range & Units 06/28/23 03:35  Glucose 70 - 99 mg/dL 161 (H)   Diabetes history: DM 2 Outpatient Diabetes medications: Trulicity 4.5 mg wkly, Amaryl 2 mg Daily, Actos 30 mg Daily, 75/25 45 units bid Current orders for Inpatient glycemic control:  Semglee 10 units qhs Actos 30 mg Daily Amaryl 2 mg Daily  A1c 7.7% on 7/10  Inpatient Diabetes Program Recommendations:    Pt on 75/25 insulin at home:  -   Order CBG checks ACHS -   Add Novolog 0-15 units tid + hs scale -   Increase Semglee to 20 units  Thanks,  Christena Deem RN, MSN, BC-ADM Inpatient Diabetes Coordinator Team Pager [phone] (8a-5p)

## 2023-06-28 NOTE — Progress Notes (Signed)
Patient ID: Katrina Wolfe, female   DOB: 03-24-1966, 57 y.o.   MRN: [id number] Patient is postoperative day 1 left below-knee amputation.  There is no drainage in the wound VAC canister with a good suction fit.  Plan for discharge to rehab based on therapy recommendations.

## 2023-06-28 NOTE — Progress Notes (Signed)
  Inpatient Rehabilitation Admissions Coordinator   Met with patient at bedside for rehab assessment. We discussed goals and expectations of CIR admit. She is nonambulatory at baseline due to right knee issues. At home are her 2 sons, 57 yo and 70 yo as well as grand kids. Sons assist her in her wheelchair to get up two steps into her home pta. Her sister arrives from [place] Sunday to provide transportation home. She is close to baseline level to go home, but needs additional practice with sons to maneuver 2 step entry into home as well as updated DME for home.  It is unlikely that [facility] medic[facility] will approve CIR admit at this level of function and I do not feel she needs SNF rehab before d/c home. I will follow her progress tomorrow for final rehab recommendations. Please call me with any questions.   Ottie Glazier, RN, MSN Rehab Admissions Coordinator [phone]

## 2023-06-29 LAB — CBC
HCT: 37.4 % (ref 36.0–46.0)
Hemoglobin: 12.8 g/dL (ref 12.0–15.0)
MCH: 33.1 pg (ref 26.0–34.0)
MCHC: 34.2 g/dL (ref 30.0–36.0)
MCV: 96.6 fL (ref 80.0–100.0)
Platelets: 311 10*3/uL (ref 150–400)
RBC: 3.87 MIL/uL (ref 3.87–5.11)
RDW: 15 % (ref 11.5–15.5)
WBC: 14.8 10*3/uL — ABNORMAL HIGH (ref 4.0–10.5)
nRBC: 0 % (ref 0.0–0.2)

## 2023-06-29 LAB — BASIC METABOLIC PANEL
Anion gap: 11 (ref 5–15)
BUN: 13 mg/dL (ref 6–20)
CO2: 20 mmol/L — ABNORMAL LOW (ref 22–32)
Calcium: 8.2 mg/dL — ABNORMAL LOW (ref 8.9–10.3)
Chloride: 102 mmol/L (ref 98–111)
Creatinine, Ser: 1.29 mg/dL — ABNORMAL HIGH (ref 0.44–1.00)
GFR, Estimated: 49 mL/min — ABNORMAL LOW (ref >60)
Glucose, Bld: 214 mg/dL — ABNORMAL HIGH (ref 70–99)
Potassium: 3.9 mmol/L (ref 3.5–5.1)
Sodium: 133 mmol/L — ABNORMAL LOW (ref 135–145)

## 2023-06-29 LAB — GLUCOSE, CAPILLARY
Glucose-Capillary: 259 mg/dL — ABNORMAL HIGH (ref 70–99)
Glucose-Capillary: 164 mg/dL — ABNORMAL HIGH (ref 70–99)
Glucose-Capillary: 192 mg/dL — ABNORMAL HIGH (ref 70–99)

## 2023-06-29 MED ORDER — INSULIN GLARGINE-YFGN 100 UNIT/ML ~~LOC~~ SOLN
20.0000 [IU] | Freq: Every day | SUBCUTANEOUS | Status: DC
  Administered 2023-06-29 – 2023-06-30 (×2): 20 [IU] via SUBCUTANEOUS
  Filled 2023-06-29 (×3): qty 0.2

## 2023-06-29 MED ORDER — INSULIN ASPART 100 UNIT/ML IJ SOLN
0.0000 [IU] | Freq: Three times a day (TID) | INTRAMUSCULAR | Status: DC
  Administered 2023-06-29: 3 [IU] via SUBCUTANEOUS
  Administered 2023-06-29: 8 [IU] via SUBCUTANEOUS
  Administered 2023-06-30: 3 [IU] via SUBCUTANEOUS
  Administered 2023-06-30: 5 [IU] via SUBCUTANEOUS
  Administered 2023-06-30 – 2023-07-01 (×2): 2 [IU] via SUBCUTANEOUS

## 2023-06-29 MED ORDER — INSULIN ASPART 100 UNIT/ML IJ SOLN: 0.0000 [IU] | Freq: Every day | INTRAMUSCULAR | Status: DC

## 2023-06-29 NOTE — Progress Notes (Signed)
Physical Therapy Treatment Patient Details Name: Katrina Wolfe MRN: [id number] DOB: 1966-06-24 Today's Date: 06/29/2023   History of Present Illness The pt is a 57 yo female presenting 7/10 for L BKA to manage charcot collapse of L ankle. PMH includes: arthritis, asthma, Bell's palsy, CKD II, DM II, HIV, HTN, PVD, blind in L eye, and multiple previous L ankle surgeries, prior R TKA    PT Comments  Pt making good progress.  She has transferred OOB to [facility] with nursing earlier and reports comfortable with scooting transfers like baseline.  Unable to practice transfer with therapy due to nausea/vomiting/hot but pt transferring to EOB and scooting at EOB without difficulty.  She reports family has gotten a ramp to enter her home and will be there to support at d/c.  Pt reporting no pain and tolerated all exercises well.  On L BKA has good flexion and quad strength but ext lacking about 10-15 degrees.  Pt tolerating stretches and placed in limb protector end of session.  Educated on importance of resting straight and encouraged to do quad sets.  On R LE - pt reports chronic buckling and "dent" in knee.  Noting that patella on R significantly more proximal than L LE, pt has no terminal knee ext, and difficult to palpate patella tendon - pt reports this is all chronic.     Assistance Recommended at Discharge Intermittent Supervision/Assistance  If plan is discharge home, recommend the following:  Can travel by private vehicle    A little help with walking and/or transfers;A little help with bathing/dressing/bathroom;Assistance with cooking/housework;Help with stairs or ramp for entrance      Equipment Recommendations  None recommended by PT    Recommendations for Other Services       Precautions / Restrictions Precautions Precautions: Fall Precaution Comments: L BKA, R knee buckles Restrictions LLE Weight Bearing: Non weight bearing     Mobility  Bed Mobility Overal bed mobility: Needs  Assistance Bed Mobility: Supine to Sit, Sit to Supine Rolling: Supervision   Supine to sit: Supervision Sit to supine: Supervision        Transfers                   General transfer comment: Declined OOB transfer due to hot, nausea, vomiting.  She got to [facility] earlier with nursing and RN reports pt did with min guard/supervision without difficulty.  Pt also expressed confidence in scooting transfers.  Has been scooting at baseline.  Reports not ambulated since 2019    Ambulation/Gait                   Stairs             Wheelchair Mobility     Tilt Bed    Modified Rankin (Stroke Patients Only)       Balance Overall balance assessment: Needs assistance Sitting-balance support: No upper extremity supported Sitting balance-Leahy Scale: Good Sitting balance - Comments: able to sit EOB without difficulty                                    Cognition Arousal/Alertness: Awake/alert Behavior During Therapy: WFL for tasks assessed/performed Overall Cognitive Status: Within Functional Limits for tasks assessed  Exercises Amputee Exercises Quad Sets: AROM, Both (10x2 on towel roll for full ext) Towel Squeeze: AROM, Both, Seated (10x2) Hip ABduction/ADduction: AROM, Left, Supine (10x2, cues to keep knee straight) Hip Flexion/Marching: AROM, Both, Seated (10x2) Knee Flexion: AROM, Left, Seated (10x2 with gentle stretch) Knee Extension: AROM, Left, AAROM, Right, Seated (10x2; gentle stretch on L end range; full assist R end range) Attempted SAQ on R but pt with no terminal knee ext strength Other Exercises Other Exercises: Knee ext stretch on R - 30 sec hold x 3 on towel roll    General Comments General comments (skin integrity, edema, etc.): Pt sweating, nauseated, and vomited ( ) during session but wants to work on exercises.  Did not feel like transfer.  BP was 150's/70's when  sitting. Pt reports unable to have BM, then gets hot, nauseated, can't eat.  States this happens at home too.  Notified RN.  Pt had c/o R knee buckling and "dent" in knee at baseline.  Noted patella more superior than L, patella tendon not palpatable, and pt with no terminal knee ext.  These are all chronic issues for pt.    Pt had difficulty with terminal knee ext with tight end feel on L.  Encouraged quad sets, wearing limb protector, and resting with leg straight.       Pertinent Vitals/Pain Pain Assessment Pain Assessment: No/denies pain    Home Living                          Prior Function            PT Goals (current goals can now be found in the care plan section) Progress towards PT goals: Progressing toward goals    Frequency    Min 3X/week      PT Plan Discharge plan needs to be updated    Co-evaluation              AM-PAC PT "6 Clicks" Mobility   Outcome Measure  Help needed turning from your back to your side while in a flat bed without using bedrails?: None Help needed moving from lying on your back to sitting on the side of a flat bed without using bedrails?: None Help needed moving to and from a bed to a chair (including a wheelchair)?: None Help needed standing up from a chair using your arms (e.g., wheelchair or bedside chair)?: Total (baseline) Help needed to walk in hospital room?: Total (baseline) Help needed climbing 3-5 steps with a railing? : Total (baseline) 6 Click Score: 15    End of Session Equipment Utilized During Treatment: Other (comment) (limb protector) Activity Tolerance: Patient tolerated treatment well;Other (comment) (Pt tolerating exercises without pain but had nausea that limited OOB) Patient left: with call bell/phone within reach;in bed;with bed alarm set Nurse Communication: Mobility status;Other (comment) (nausea/vomiting/no BM) PT Visit Diagnosis: Other abnormalities of gait and mobility (R26.89);History of  falling (Z91.81);Muscle weakness (generalized) (M62.81)     Time: 1630-1701 PT Time Calculation (min) (ACUTE ONLY): 31 min  Charges:    $Therapeutic Exercise: 8-22 mins $Therapeutic Activity: 8-22 mins PT General Charges $$ ACUTE PT VISIT: 1 Visit                     Anise Salvo, PT Acute Rehab [facility] Rehab [phone]    Rayetta Humphrey 06/29/2023, 5:15 PM

## 2023-06-29 NOTE — Progress Notes (Signed)
Call placed to Dr. Audrie Lia on-call, pt c/o severe nausea and needs a med, Zofran last given at 1713, order for every 6 hrs, Dr. Lajoyce Corners returned call and gave VO to give an additional dose of Zofran now.

## 2023-06-29 NOTE — Progress Notes (Signed)
Inpatient Rehabilitation Admissions Coordinator   I met with patient at bedside She is making preparations to d/c home on Sunday when her sister arrives from out of town, Her cousin has bought her a ramp and it is to be installed Saturday. We will sign off at this time.  Ottie Glazier, RN, MSN Rehab Admissions Coordinator [phone] 06/29/2023 12:32 PM

## 2023-06-29 NOTE — Progress Notes (Signed)
Patient ID: Katrina Wolfe, female   DOB: [birth year], 57 y.o.   MRN: [id number] Patient is status post transtibial amputation.  Plan for discharge to home on Sunday.  Patient has no complaints this morning.

## 2023-06-29 NOTE — Anesthesia Postprocedure Evaluation (Addendum)
Anesthesia Post Note  Patient: Katrina Wolfe  Procedure(s) Performed: LEFT BELOW KNEE AMPUTATION (Left: Knee)     Patient location during evaluation: PACU Anesthesia Type: Regional and MAC Level of consciousness: awake and alert Pain management: pain level controlled Vital Signs Assessment: post-procedure vital signs reviewed and stable Respiratory status: spontaneous breathing, nonlabored ventilation, respiratory function stable and patient connected to nasal cannula oxygen Cardiovascular status: blood pressure returned to baseline and stable Postop Assessment: no apparent nausea or vomiting Anesthetic complications: yes   Encounter Notable Events  Notable Event Outcome Phase Comment  Difficult to intubate - unexpected  In Recovery Filed from anesthesia note documentation.    Last Vitals:  Vitals:   06/29/23 0441 06/29/23 0740  BP: (!) 148/88 135/73  Pulse: 98 96  Resp: 16 18  Temp:  36.9 C  SpO2: 97% 93%    Last Pain:  Vitals:   06/29/23 0026  TempSrc:   PainSc: Asleep                 [name]

## 2023-06-30 LAB — GLUCOSE, CAPILLARY
Glucose-Capillary: 142 mg/dL — ABNORMAL HIGH (ref 70–99)
Glucose-Capillary: 159 mg/dL — ABNORMAL HIGH (ref 70–99)
Glucose-Capillary: 215 mg/dL — ABNORMAL HIGH (ref 70–99)
Glucose-Capillary: 177 mg/dL — ABNORMAL HIGH (ref 70–99)

## 2023-06-30 NOTE — Plan of Care (Signed)
  Problem: Education: Goal: Knowledge of the prescribed therapeutic regimen will improve Outcome: Progressing Goal: Ability to verbalize activity precautions or restrictions will improve Outcome: Progressing Goal: Understanding of discharge needs will improve Outcome: Progressing   Problem: Activity: Goal: Ability to perform//tolerate increased activity and mobilize with assistive devices will improve Outcome: Progressing   

## 2023-06-30 NOTE — Plan of Care (Signed)
  Problem: Education: Goal: Knowledge of the prescribed therapeutic regimen will improve Outcome: Progressing   Problem: Activity: Goal: Ability to perform//tolerate increased activity and mobilize with assistive devices will improve Outcome: Progressing   

## 2023-06-30 NOTE — Plan of Care (Signed)
  Problem: Education: Goal: Knowledge of the prescribed therapeutic regimen will improve Outcome: Progressing Goal: Ability to verbalize activity precautions or restrictions will improve Outcome: Progressing   Problem: Clinical Measurements: Goal: Postoperative complications will be avoided or minimized Outcome: Progressing   Problem: Self-Care: Goal: Ability to meet self-care needs will improve Outcome: Progressing   Problem: Pain Management: Goal: Pain level will decrease with appropriate interventions Outcome: Progressing

## 2023-06-30 NOTE — Progress Notes (Signed)
Patient ID: Katrina Wolfe, female   DOB: 01/03/1966, 57 y.o.   MRN: [id number] Patient is comfortable this morning.  There is no drainage in the wound VAC canister there is a good suction fit.  Plan for discharge to home on Sunday with the Praveena plus portable wound VAC pump.

## 2023-07-01 LAB — GLUCOSE, CAPILLARY: Glucose-Capillary: 131 mg/dL — ABNORMAL HIGH (ref 70–99)

## 2023-07-01 MED ORDER — OXYCODONE HCL 5 MG PO TABS: 5.0000 mg | ORAL_TABLET | ORAL | 0 refills | Status: AC | PRN

## 2023-07-01 NOTE — TOC Transition Note (Signed)
Transition of Care [facility]) - CM/SW Discharge Note   Patient Details  Name: Katrina Wolfe MRN: [id number] Date of Birth: 1966-01-21  [facility] of [facility]) CM/SW Contact:  Ronny Bacon, RN Phone Number: 07/01/2023, 9:08 AM   Clinical Narrative:  Patient being discharged home today. Patient reports that her son will transport her home at discharge. Patient confirms that she has BSC, Shower bench, wheel chair ramp and WC at home. HH PT and OT arranged with [facility].    Final next level of care: Home w Home Health Services Barriers to Discharge: No Barriers Identified   Patient Goals and CMS Choice      Discharge Placement                         Discharge Plan and Services Additional resources added to the After Visit Summary for                            HH Arranged: PT, OT HH Agency: [facility] Date [facility] Agency Contacted: 07/01/23 Time HH Agency Contacted: [phone] Representative spoke with at [facility] Agency: Kandee Keen  Social Determinants of Health (SDOH) Interventions SDOH Screenings   Food Insecurity: No Food Insecurity (06/27/2023)  Housing: Low Risk  (06/27/2023)  Transportation Needs: No Transportation Needs (06/27/2023)  Utilities: Not At Risk (06/27/2023)  Depression (PHQ2-9): Low Risk  (04/19/2023)  Tobacco Use: Low Risk  (06/27/2023)     Readmission Risk Interventions     No data to display

## 2023-07-01 NOTE — Progress Notes (Signed)
Patient to discharge to home driven by sister [place]. Education provided on NWB to LLE and to wear NPWT when out of bed, and to keep stump elevated to reduce swelling. Education provided to keep stump wrapped to help with shaping. Education provided on signs and symptoms of infection. Wound vac changed to Prevena to take home. Education provided on not smoking. PIV removed. Pain addressed. AVS printed and reviewed with patient. Oliver Barre, RN

## 2023-07-01 NOTE — Discharge Summary (Signed)
Discharge Diagnoses:  Principal Problem:   S/P BKA (below knee amputation) unilateral, left (HCC)   Surgeries: Procedure(s): LEFT BELOW KNEE AMPUTATION on 06/27/2023    Consultants:   Discharged Condition: Improved  Hospital Course: Katrina Wolfe is an 57 y.o. female who was admitted 06/27/2023 with a chief complaint of infected charcot left ankle, with a final diagnosis of Charcot Collapse Left Ankle.  Patient was brought to the operating room on 06/27/2023 and underwent Procedure(s): LEFT BELOW KNEE AMPUTATION.    Patient was given perioperative antibiotics:  Anti-infectives (From admission, onward)    Start     Dose/Rate Route Frequency Ordered Stop   06/27/23 1800  dolutegravir-lamiVUDine (DOVATO) 50-300 MG per tablet 1 tablet        1 tablet Oral Daily 06/27/23 1533     06/27/23 1600  ceFAZolin (ANCEF) IVPB 2g/100 mL premix        2 g 200 mL/hr over 30 Minutes Intravenous Every 8 hours 06/27/23 1533 06/28/23 2039   06/27/23 1045  ceFAZolin (ANCEF) IVPB 2g/100 mL premix        2 g 200 mL/hr over 30 Minutes Intravenous On call to O.R. 06/27/23 1043 06/27/23 1421   06/27/23 1045  ceFAZolin (ANCEF) 2-4 GM/100ML-% IVPB       Note to Pharmacy: Isabel Caprice, Destiny: cabinet override      06/27/23 1045 06/27/23 1421     .  Patient was given sequential compression devices, early ambulation, and aspirin for DVT prophylaxis.  Recent vital signs: Patient Vitals for the past 24 hrs:  BP Temp Temp src Pulse Resp SpO2  07/01/23 0734 133/89 98 F (36.7 C) Oral 89 16 98 %  07/01/23 0436 (!) 155/75 -- -- (!) 41 15 95 %  06/30/23 2009 (!) 147/71 98.7 F (37.1 C) Oral (!) 103 17 95 %  06/30/23 1636 (!) 145/89 98.3 F (36.8 C) -- (!) 53 20 98 %  06/30/23 0843 (!) 154/81 98.5 F (36.9 C) -- 83 -- 94 %  .  Recent laboratory studies: No results found.  Discharge Medications:   Allergies as of 07/01/2023       Reactions   Shellfish Allergy Anaphylaxis   All types shellfish   Bactrim  [sulfamethoxazole-trimethoprim] Hives        Medication List     STOP taking these medications    doxycycline 100 MG tablet Commonly known as: VIBRA-TABS   traMADol 50 MG tablet Commonly known as: ULTRAM       TAKE these medications    acetaminophen 500 MG tablet Commonly known as: TYLENOL Take 1,000 mg by mouth every 6 (six) hours as needed for moderate pain.   amLODipine 5 MG tablet Commonly known as: NORVASC Take 5 mg by mouth in the morning.   aspirin EC 81 MG tablet Take 81 mg by mouth daily. Swallow whole.   calcium carbonate 500 MG chewable tablet Commonly known as: TUMS - dosed in mg elemental calcium Chew 1 tablet by mouth as needed for indigestion or heartburn.   Calcium Carbonate-Vitamin D 600-200 MG-UNIT Tabs Take 1 tablet by mouth daily.   Comfort EZ Pen Needles 31G X 8 MM Misc Generic drug: Insulin Pen Needle USE WITH insulin THREE TIMES DAILY AS DIRECTED   Dexcom G6 Receiver Devi as directed 30 days   diphenhydrAMINE 25 MG tablet Commonly known as: BENADRYL Take 25 mg by mouth every 6 (six) hours as needed for allergies (or allergic reactions).   Dovato 50-300 MG tablet Generic drug:  dolutegravir-lamiVUDine TAKE ONE TABLET BY MOUTH EVERYDAY AT BEDTIME   gabapentin 100 MG capsule Commonly known as: NEURONTIN Take 100 mg by mouth 4 (four) times daily.   glimepiride 2 MG tablet Commonly known as: AMARYL Take 2 mg by mouth daily with breakfast.   HumaLOG Mix 75/25 KwikPen (75-25) 100 UNIT/ML KwikPen Generic drug: Insulin Lispro Prot & Lispro Inject 45 Units into the skin 2 (two) times daily. Breakfast and Dinner   INSULIN SYRINGE 1CC/31GX5/16" 31G X 5/16" 1 ML Misc 1 Units by Does not apply route 2 (two) times daily.   lisinopril-hydrochlorothiazide 20-25 MG tablet Commonly known as: ZESTORETIC Take 1 tablet by mouth daily.   norethindrone 5 MG tablet Commonly known as: AYGESTIN Take 5 mg by mouth in the morning and at bedtime.    ONE TOUCH ULTRA TEST test strip Generic drug: glucose blood   onetouch ultrasoft lancets   oxyCODONE 5 MG immediate release tablet Commonly known as: Oxy IR/ROXICODONE Take 1 tablet (5 mg total) by mouth every 4 (four) hours as needed for severe pain.   pantoprazole 20 MG tablet Commonly known as: PROTONIX Take 20 mg by mouth every morning.   pioglitazone 30 MG tablet Commonly known as: ACTOS Take 30 mg by mouth every morning.   rosuvastatin 5 MG tablet Commonly known as: CRESTOR Take 5 mg by mouth daily with lunch.   Trulicity 4.5 MG/0.5ML Sopn Generic drug: Dulaglutide Inject 4.5 mg into the skin every Friday.        Diagnostic Studies: XR Ankle Complete Left  Result Date: 06/12/2023 Patient has progressive Charcot collapse of the left ankle with failure of the hardware and nonunion at the fusion site.   Patient benefited maximally from their hospital stay and there were no complications.     Disposition: Discharge disposition: 01-Home or Self Care      Discharge Instructions     Call MD / Call 911   Complete by: As directed    If you experience chest pain or shortness of breath, CALL 911 and be transported to the hospital emergency room.  If you develope a fever above 101 F, pus (white drainage) or increased drainage or redness at the wound, or calf pain, call your surgeon's office.   Constipation Prevention   Complete by: As directed    Drink plenty of fluids.  Prune juice may be helpful.  You may use a stool softener, such as Colace (over the counter) 100 mg twice a day.  Use MiraLax (over the counter) for constipation as needed.   Diet - low sodium heart healthy   Complete by: As directed    Increase activity slowly as tolerated   Complete by: As directed    Negative Pressure Wound Therapy - Incisional   Complete by: As directed    Attach vac dressing to prevena pump at discharge   Post-operative opioid taper instructions:   Complete by: As directed     POST-OPERATIVE OPIOID TAPER INSTRUCTIONS: It is important to wean off of your opioid medication as soon as possible. If you do not need pain medication after your surgery it is ok to stop day one. Opioids include: Codeine, Hydrocodone(Norco, Vicodin), Oxycodone(Percocet, oxycontin) and hydromorphone amongst others.  Long term and even short term use of opiods can cause: Increased pain response Dependence Constipation Depression Respiratory depression And more.  Withdrawal symptoms can include Flu like symptoms Nausea, vomiting And more Techniques to manage these symptoms Hydrate well Eat regular healthy meals Stay active Use  relaxation techniques(deep breathing, meditating, yoga) Do Not substitute Alcohol to help with tapering If you have been on opioids for less than two weeks and do not have pain than it is ok to stop all together.  Plan to wean off of opioids This plan should start within one week post op of your joint replacement. Maintain the same interval or time between taking each dose and first decrease the dose.  Cut the total daily intake of opioids by one tablet each day Next start to increase the time between doses. The last dose that should be eliminated is the evening dose.          Follow-up Information     Nadara Mustard, MD Follow up in 1 week(s).   Specialty: Orthopedic Surgery Contact information: [address] [place] [address] [phone]                  Signed: Nadara Mustard 07/01/2023, 8:29 AM

## 2023-07-01 NOTE — Discharge Summary (Signed)
Discharge Diagnoses:  Principal Problem:   S/P BKA (below knee amputation) unilateral, left (HCC)   Surgeries: Procedure(s): LEFT BELOW KNEE AMPUTATION on 06/27/2023    Consultants:   Discharged Condition: Improved  Hospital Course: Katrina Wolfe is an 57 y.o. female who was admitted 06/27/2023 with a chief complaint of infected Charcot ankle., with a final diagnosis of Charcot Collapse Left Ankle.  Patient was brought to the operating room on 06/27/2023 and underwent Procedure(s): LEFT BELOW KNEE AMPUTATION.    Patient was given perioperative antibiotics:  Anti-infectives (From admission, onward)    Start     Dose/Rate Route Frequency Ordered Stop   06/27/23 1800  dolutegravir-lamiVUDine (DOVATO) 50-300 MG per tablet 1 tablet        1 tablet Oral Daily 06/27/23 1533     06/27/23 1600  ceFAZolin (ANCEF) IVPB 2g/100 mL premix        2 g 200 mL/hr over 30 Minutes Intravenous Every 8 hours 06/27/23 1533 06/28/23 2039   06/27/23 1045  ceFAZolin (ANCEF) IVPB 2g/100 mL premix        2 g 200 mL/hr over 30 Minutes Intravenous On call to O.R. 06/27/23 1043 06/27/23 1421   06/27/23 1045  ceFAZolin (ANCEF) 2-4 GM/100ML-% IVPB       Note to Pharmacy: Isabel Caprice, Destiny: cabinet override      06/27/23 1045 06/27/23 1421     .  Patient was given sequential compression devices, early ambulation, and aspirin for DVT prophylaxis.  Recent vital signs: Patient Vitals for the past 24 hrs:  BP Temp Temp src Pulse Resp SpO2  07/01/23 0734 133/89 98 F (36.7 C) Oral 89 16 98 %  07/01/23 0436 (!) 155/75 -- -- (!) 41 15 95 %  06/30/23 2009 (!) 147/71 98.7 F (37.1 C) Oral (!) 103 17 95 %  06/30/23 1636 (!) 145/89 98.3 F (36.8 C) -- (!) 53 20 98 %  .  Recent laboratory studies: No results found.  Discharge Medications:   Allergies as of 07/01/2023       Reactions   Shellfish Allergy Anaphylaxis   All types shellfish   Bactrim [sulfamethoxazole-trimethoprim] Hives        Medication List      STOP taking these medications    doxycycline 100 MG tablet Commonly known as: VIBRA-TABS   traMADol 50 MG tablet Commonly known as: ULTRAM       TAKE these medications    acetaminophen 500 MG tablet Commonly known as: TYLENOL Take 1,000 mg by mouth every 6 (six) hours as needed for moderate pain.   amLODipine 5 MG tablet Commonly known as: NORVASC Take 5 mg by mouth in the morning.   aspirin EC 81 MG tablet Take 81 mg by mouth daily. Swallow whole.   calcium carbonate 500 MG chewable tablet Commonly known as: TUMS - dosed in mg elemental calcium Chew 1 tablet by mouth as needed for indigestion or heartburn.   Calcium Carbonate-Vitamin D 600-200 MG-UNIT Tabs Take 1 tablet by mouth daily.   Comfort EZ Pen Needles 31G X 8 MM Misc Generic drug: Insulin Pen Needle USE WITH insulin THREE TIMES DAILY AS DIRECTED   Dexcom G6 Receiver Devi as directed 30 days   diphenhydrAMINE 25 MG tablet Commonly known as: BENADRYL Take 25 mg by mouth every 6 (six) hours as needed for allergies (or allergic reactions).   Dovato 50-300 MG tablet Generic drug: dolutegravir-lamiVUDine TAKE ONE TABLET BY MOUTH EVERYDAY AT BEDTIME   gabapentin 100 MG capsule  Commonly known as: NEURONTIN Take 100 mg by mouth 4 (four) times daily.   glimepiride 2 MG tablet Commonly known as: AMARYL Take 2 mg by mouth daily with breakfast.   HumaLOG Mix 75/25 KwikPen (75-25) 100 UNIT/ML KwikPen Generic drug: Insulin Lispro Prot & Lispro Inject 45 Units into the skin 2 (two) times daily. Breakfast and Dinner   INSULIN SYRINGE 1CC/31GX5/16" 31G X 5/16" 1 ML Misc 1 Units by Does not apply route 2 (two) times daily.   lisinopril-hydrochlorothiazide 20-25 MG tablet Commonly known as: ZESTORETIC Take 1 tablet by mouth daily.   norethindrone 5 MG tablet Commonly known as: AYGESTIN Take 5 mg by mouth in the morning and at bedtime.   ONE TOUCH ULTRA TEST test strip Generic drug: glucose blood    onetouch ultrasoft lancets   oxyCODONE 5 MG immediate release tablet Commonly known as: Oxy IR/ROXICODONE Take 1 tablet (5 mg total) by mouth every 4 (four) hours as needed for severe pain.   pantoprazole 20 MG tablet Commonly known as: PROTONIX Take 20 mg by mouth every morning.   pioglitazone 30 MG tablet Commonly known as: ACTOS Take 30 mg by mouth every morning.   rosuvastatin 5 MG tablet Commonly known as: CRESTOR Take 5 mg by mouth daily with lunch.   Trulicity 4.5 MG/0.5ML Sopn Generic drug: Dulaglutide Inject 4.5 mg into the skin every Friday.        Diagnostic Studies: XR Ankle Complete Left  Result Date: 06/12/2023 Patient has progressive Charcot collapse of the left ankle with failure of the hardware and nonunion at the fusion site.   Patient benefited maximally from their hospital stay and there were no complications.     Disposition: Discharge disposition: 01-Home or Self Care      Discharge Instructions     Call MD / Call 911   Complete by: As directed    If you experience chest pain or shortness of breath, CALL 911 and be transported to the hospital emergency room.  If you develope a fever above 101 F, pus (white drainage) or increased drainage or redness at the wound, or calf pain, call your surgeon's office.   Call MD / Call 911   Complete by: As directed    If you experience chest pain or shortness of breath, CALL 911 and be transported to the hospital emergency room.  If you develope a fever above 101 F, pus (white drainage) or increased drainage or redness at the wound, or calf pain, call your surgeon's office.   Constipation Prevention   Complete by: As directed    Drink plenty of fluids.  Prune juice may be helpful.  You may use a stool softener, such as Colace (over the counter) 100 mg twice a day.  Use MiraLax (over the counter) for constipation as needed.   Constipation Prevention   Complete by: As directed    Drink plenty of fluids.   Prune juice may be helpful.  You may use a stool softener, such as Colace (over the counter) 100 mg twice a day.  Use MiraLax (over the counter) for constipation as needed.   Diet - low sodium heart healthy   Complete by: As directed    Diet - low sodium heart healthy   Complete by: As directed    Face-to-face encounter (required for Medicare/Medicaid patients)   Complete by: As directed    I Nadara Mustard certify that this patient is under my care and that I, or a nurse  practitioner or physician's assistant working with me, had a face-to-face encounter that meets the physician face-to-face encounter requirements with this patient on 07/01/2023. The encounter with the patient was in whole, or in part for the following medical condition(s) which is the primary reason for home health care (List medical condition): Below-knee amputation.   The encounter with the patient was in whole, or in part, for the following medical condition, which is the primary reason for home health care: Below-knee amputation   I certify that, based on my findings, the following services are medically necessary home health services: Physical therapy   Reason for Medically Necessary Home Health Services: Therapy- Investment banker, operational, Patent examiner   My clinical findings support the need for the above services: Unable to leave home safely without assistance and/or assistive device   Further, I certify that my clinical findings support that this patient is homebound due to: Unsafe ambulation due to balance issues   Home Health   Complete by: As directed    To provide the following care/treatments:  PT OT     Increase activity slowly as tolerated   Complete by: As directed    Increase activity slowly as tolerated   Complete by: As directed    Negative Pressure Wound Therapy - Incisional   Complete by: As directed    Attach vac dressing to prevena pump at discharge   Post-operative opioid taper  instructions:   Complete by: As directed    POST-OPERATIVE OPIOID TAPER INSTRUCTIONS: It is important to wean off of your opioid medication as soon as possible. If you do not need pain medication after your surgery it is ok to stop day one. Opioids include: Codeine, Hydrocodone(Norco, Vicodin), Oxycodone(Percocet, oxycontin) and hydromorphone amongst others.  Long term and even short term use of opiods can cause: Increased pain response Dependence Constipation Depression Respiratory depression And more.  Withdrawal symptoms can include Flu like symptoms Nausea, vomiting And more Techniques to manage these symptoms Hydrate well Eat regular healthy meals Stay active Use relaxation techniques(deep breathing, meditating, yoga) Do Not substitute Alcohol to help with tapering If you have been on opioids for less than two weeks and do not have pain than it is ok to stop all together.  Plan to wean off of opioids This plan should start within one week post op of your joint replacement. Maintain the same interval or time between taking each dose and first decrease the dose.  Cut the total daily intake of opioids by one tablet each day Next start to increase the time between doses. The last dose that should be eliminated is the evening dose.      Post-operative opioid taper instructions:   Complete by: As directed    POST-OPERATIVE OPIOID TAPER INSTRUCTIONS: It is important to wean off of your opioid medication as soon as possible. If you do not need pain medication after your surgery it is ok to stop day one. Opioids include: Codeine, Hydrocodone(Norco, Vicodin), Oxycodone(Percocet, oxycontin) and hydromorphone amongst others.  Long term and even short term use of opiods can cause: Increased pain response Dependence Constipation Depression Respiratory depression And more.  Withdrawal symptoms can include Flu like symptoms Nausea, vomiting And more Techniques to manage these  symptoms Hydrate well Eat regular healthy meals Stay active Use relaxation techniques(deep breathing, meditating, yoga) Do Not substitute Alcohol to help with tapering If you have been on opioids for less than two weeks and do not have pain than it is ok  to stop all together.  Plan to wean off of opioids This plan should start within one week post op of your joint replacement. Maintain the same interval or time between taking each dose and first decrease the dose.  Cut the total daily intake of opioids by one tablet each day Next start to increase the time between doses. The last dose that should be eliminated is the evening dose.          Follow-up Information     Nadara Mustard, MD Follow up in 1 week(s).   Specialty: Orthopedic Surgery Contact information: [address] [place] [address] [phone]                  Signed: Nadara Mustard 07/01/2023, 9:13 AM

## 2023-07-02 LAB — SURGICAL PATHOLOGY

## 2023-07-06 ENCOUNTER — Other Ambulatory Visit: Payer: Self-pay

## 2023-07-06 ENCOUNTER — Ambulatory Visit (INDEPENDENT_AMBULATORY_CARE_PROVIDER_SITE_OTHER): Payer: 59 | Admitting: Family

## 2023-07-06 DIAGNOSIS — Z89512 Acquired absence of left leg below knee: Secondary | ICD-10-CM

## 2023-07-06 DIAGNOSIS — M25561 Pain in right knee: Secondary | ICD-10-CM

## 2023-07-07 ENCOUNTER — Encounter: Payer: Self-pay | Admitting: Family

## 2023-07-07 NOTE — Progress Notes (Addendum)
Post-Op Visit Note   Patient: Katrina Wolfe           Date of Birth: 1966/10/29           MRN: [id number] Visit Date: 07/06/2023 PCP: Georgann Housekeeper, MD  Chief Complaint:  Chief Complaint  Patient presents with   Left Leg - Routine Post Op    06/29/2023 left BKA d/c wound vac     HPI:  HPI The patient is a 57 year old woman seen status post left below-knee amputation July 17.  Wound VAC removed today.  Has begun physical therapy.  She is having difficulty due to her right knee pain and subluxation of the patella on the right.  Wonders what could be done about this.  Ortho Exam On examination left residual limb this is well-approximated with staples there is no gaping or drainage  Visit Diagnoses:  1. Right knee pain, unspecified chronicity     Plan: Begin daily Dial soap cleansing.  Dry dressings.  Shrinker around-the-clock.  She will follow-up in 2 weeks for staple removal.  Would like to discuss her right knee pain with Dr. Lajoyce Corners at next visit radiographs performed today  Follow-Up Instructions: No follow-ups on file.   Imaging: No results found.  Orders:  Orders Placed This Encounter  Procedures   XR Knee 1-2 Views Right   No orders of the defined types were placed in this encounter.    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, with necrosis of bone (HCC)    Diabetes mellitus type 2, uncontrolled, with complications 06/26/2021   Diabetic nephropathy (HCC) 06/26/2021   CKD (chronic kidney disease), stage III (HCC) 06/26/2021   HTN (hypertension) 06/24/2021   HIV (human immunodeficiency virus infection) (HCC) 06/24/2021   DM2 (diabetes mellitus, type 2) (HCC)  06/24/2021   HLD (hyperlipidemia) 06/24/2021   Osteomyelitis of left foot (HCC) 06/24/2021   Elevated LFTs 06/24/2021   Acute-on-chronic kidney injury (HCC) 06/24/2021   Osteomyelitis of foot (HCC) 04/04/2019   Hardware complicating wound infection (HCC)    Subacute osteomyelitis, left ankle and foot (HCC)    Open displaced fracture of fifth metatarsal bone of left foot    Healthcare maintenance 04/03/2019   HIV disease (HCC) 10/27/2015   DM type 2 (diabetes mellitus, type 2) (HCC) 10/27/2015   Hyperlipidemia 10/27/2015   Essential hypertension 10/27/2015   Severe vulvar dysplasia, histologically confirmed 10/15/2015   Past Medical History:  Diagnosis Date   Abnormal uterine bleeding (AUB)    Arthritis    knees, fingers   Asthma    as child, no problem since 1995   Bell's palsy 2004   CKD (chronic kidney disease), stage II    Complication of anesthesia    Congenital cardiomegaly    per pt has always been told since childhood and siblings    Diabetes mellitus without complication (HCC)    Dysrhythmia    Occasionally skipped beats   Elevated liver function tests    GERD (gastroesophageal reflux disease)    History of genital warts    HIV (human immunodeficiency virus infection) (HCC)    DX 1998--  MONITORED BY INFECTIOUS DISEASE-- DR Judyann Munson   Hypercholesterolemia    Hypertension  Intermittent palpitations    mild-- no meds   Legally blind in left eye, as defined in [place]    trauma as child   Peripheral vascular disease (HCC)    Pneumonia    PONV (postoperative nausea and vomiting)    Renal disorder    Tuberculosis    ? 2005 or 2007   Type 2 diabetes mellitus (HCC)    Type II    Uterine fibroid    VIN III (vulvar intraepithelial neoplasia III)    and Verrucoid lesion on the mons   Wears glasses     Family History  Problem Relation Age of Onset   Hypertension Mother    Diabetes Father    Cancer Brother    Breast cancer Cousin     Past Surgical  History:  Procedure Laterality Date   AMPUTATION Left 06/27/2023   Procedure: LEFT BELOW KNEE AMPUTATION;  Surgeon: Nadara Mustard, MD;  Location: [facility] OR;  Service: Orthopedics;  Laterality: Left;   ANKLE FUSION Left 02/21/2023   Procedure: REVISION LEFT ANKLE FUSION;  Surgeon: Nadara Mustard, MD;  Location: [facility];  Service: Orthopedics;  Laterality: Left;   CESAREAN SECTION  2001  &  1984   CO2 LASER APPLICATION N/A 11/26/2015   Procedure: CO2 LASER APPLICATION AND WIDE VOCAL EXCSION OF LESION ON MONS PUBIS;  Surgeon: De Blanch, MD;  Location: [facility];  Service: Gynecology;  Laterality: N/A;   CASE CANCELLED    CO2 LASER APPLICATION N/A 01/07/2016   Procedure: CO2 LASER OF VULVAR;  Surgeon: De Blanch, MD;  Location: [facility];  Service: Gynecology;  Laterality: N/A;   CORNEAL TRANSPLANT Left 12/18/2004   failed   D & C HYSTEROSCOPY /  RESECTION FIBROID  12/18/2002   DILATATION & CURETTAGE/HYSTEROSCOPY WITH MYOSURE N/A 10/01/2019   Procedure: DILATATION & CURETTAGE/HYSTEROSCOPY WITH MYOSURE and HYDROTHERMAL ABLATION;  Surgeon: Geryl Rankins, MD;  Location: [facility];  Service: Gynecology;  Laterality: N/A;  HTA rep will be here. Confirmed on 09/25/19 CS   EYE SURGERY     FOOT ARTHRODESIS Left 11/30/2021   Procedure: LEFT TIBIOCALCANEAL FUSION;  Surgeon: Nadara Mustard, MD;  Location: [facility] OR;  Service: Orthopedics;  Laterality: Left;   HARDWARE REMOVAL Left 04/04/2019   Procedure: EXCISION BONE AND REMOVE DEEP HARDWARE LEFT 5TH METATARSAL;  Surgeon: Nadara Mustard, MD;  Location: [facility] OR;  Service: Orthopedics;  Laterality: Left;   HARDWARE REMOVAL Left 08/04/2022   Procedure: REMOVAL DEEP HARDWARE LEFT ANKLE;  Surgeon: Nadara Mustard, MD;  Location: [facility] OR;  Service: Orthopedics;  Laterality: Left;   I & D EXTREMITY Left 06/27/2021   Procedure: IRRIGATION AND DEBRIDEMENT FOOT ULCER;  Surgeon: Kerrin Champagne, MD;  Location:  [facility] OR;  Service: Orthopedics;  Laterality: Left;   KNEE ARTHROSCOPY W/ ACL RECONSTRUCTION Bilateral 12/18/2006   ORIF TOE FRACTURE Left 08/02/2017   Procedure: OPEN REDUCTION INTERNAL FIXATION (ORIF) FIFTH METATARSAL (TOE) BASE FRACTURE;  Surgeon: Toni Arthurs, MD;  Location: [facility];  Service: Orthopedics;  Laterality: Left;   VULVECTOMY N/A 01/07/2016   Procedure: WIDE LOCAL EXCISION;  Surgeon: De Blanch, MD;  Location: [facility];  Service: Gynecology;  Laterality: N/A;  mons pubis as site   Social History   Occupational History   Not on file  Tobacco Use   Smoking status: Never   Smokeless tobacco: Never  Vaping Use   Vaping status: Never Used  Substance and  Sexual Activity   Alcohol use: No    Alcohol/week: 0.0 standard drinks of alcohol   Drug use: No   Sexual activity: Yes    Partners: Male    Birth control/protection: Condom, Post-menopausal

## 2023-07-19 ENCOUNTER — Ambulatory Visit (INDEPENDENT_AMBULATORY_CARE_PROVIDER_SITE_OTHER): Payer: 59 | Admitting: Orthopedic Surgery

## 2023-07-19 DIAGNOSIS — Z89512 Acquired absence of left leg below knee: Secondary | ICD-10-CM

## 2023-08-02 ENCOUNTER — Encounter: Payer: Self-pay | Admitting: Orthopedic Surgery

## 2023-08-02 NOTE — Progress Notes (Signed)
Patient is a 57 year old woman who is 3 weeks status post left below-knee amputation.  We will harvest the staples today continue with the stump shrinker.  Patient does have patella alta on the right status post patella tendon repair.  She lacks about 45 degrees of full extension.  Will need to address this in the future possibly with bracing.  Patient is a new left transtibial  amputee.  Patient's current comorbidities are not expected to impact the ability to function with the prescribed prosthesis. Patient verbally communicates a strong desire to use a prosthesis. Patient currently requires mobility aids to ambulate without a prosthesis.  Expects not to use mobility aids with a new prosthesis.  Patient is a K2 level ambulator that will use a prosthesis to walk around their home and the community over low level environmental barriers.

## 2023-08-15 ENCOUNTER — Encounter: Payer: Self-pay | Admitting: Family

## 2023-08-15 ENCOUNTER — Ambulatory Visit (INDEPENDENT_AMBULATORY_CARE_PROVIDER_SITE_OTHER): Payer: 59 | Admitting: Family

## 2023-08-15 DIAGNOSIS — Z89512 Acquired absence of left leg below knee: Secondary | ICD-10-CM

## 2023-08-15 NOTE — Progress Notes (Signed)
Post-Op Visit Note   Patient: Katrina Wolfe           Date of Birth: Apr 27, 1966           MRN: [id number] Visit Date: 08/15/2023 PCP: Georgann Housekeeper, MD  Chief Complaint: No chief complaint on file.   HPI:  HPI The patient is a 57 year old woman who is seen status post left below-knee amputation July 10 Ortho Exam On examination left residual limb this is well consolidated well-healed.  There are 2 remaining staples these were harvested today without incident there is no erythema warmth or sign of infection  Visit Diagnoses: No diagnosis found.  Plan: Proceed with prosthesis set up.  Patient plans to follow-up with Dr. Lajoyce Corners prior to beginning gait training to discuss her right knee pain, patella alta.  Follow-Up Instructions: No follow-ups on file.   Imaging: No results found.  Orders:  No orders of the defined types were placed in this encounter.  No orders of the defined types were placed in this encounter.    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, with necrosis of bone (HCC)    Diabetes mellitus type 2, uncontrolled, with complications 06/26/2021   Diabetic nephropathy (HCC) 06/26/2021   CKD (chronic kidney disease), stage III (HCC) 06/26/2021   HTN (hypertension) 06/24/2021   HIV (human immunodeficiency virus infection) (HCC) 06/24/2021   DM2 (diabetes mellitus, type 2) (HCC) 06/24/2021   HLD (hyperlipidemia) 06/24/2021   Osteomyelitis of left foot (HCC) 06/24/2021   Elevated LFTs 06/24/2021   Acute-on-chronic kidney injury (HCC) 06/24/2021   Osteomyelitis of foot (HCC) 04/04/2019   Hardware complicating wound infection (HCC)    Subacute osteomyelitis, left ankle  and foot (HCC)    Open displaced fracture of fifth metatarsal bone of left foot    Healthcare maintenance 04/03/2019   HIV disease (HCC) 10/27/2015   DM type 2 (diabetes mellitus, type 2) (HCC) 10/27/2015   Hyperlipidemia 10/27/2015   Essential hypertension 10/27/2015   Severe vulvar dysplasia, histologically confirmed 10/15/2015   Past Medical History:  Diagnosis Date   Abnormal uterine bleeding (AUB)    Arthritis    knees, fingers   Asthma    as child, no problem since 1995   Bell's palsy 2004   CKD (chronic kidney disease), stage II    Complication of anesthesia    Congenital cardiomegaly    per pt has always been told since childhood and siblings    Diabetes mellitus without complication (HCC)    Dysrhythmia    Occasionally skipped beats   Elevated liver function tests    GERD (gastroesophageal reflux disease)    History of genital warts    HIV (human immunodeficiency virus infection) (HCC)    DX 1998--  MONITORED BY INFECTIOUS DISEASE-- DR Judyann Munson   Hypercholesterolemia    Hypertension    Intermittent palpitations    mild-- no meds   Legally blind in left eye, as defined in [place]    trauma as child   Peripheral vascular disease (HCC)    Pneumonia    PONV (postoperative nausea and vomiting)    Renal disorder    Tuberculosis    ? 2005 or 2007   Type  2 diabetes mellitus (HCC)    Type II    Uterine fibroid    VIN III (vulvar intraepithelial neoplasia III)    and Verrucoid lesion on the mons   Wears glasses     Family History  Problem Relation Age of Onset   Hypertension Mother    Diabetes Father    Cancer Brother    Breast cancer Cousin     Past Surgical History:  Procedure Laterality Date   AMPUTATION Left 06/27/2023   Procedure: LEFT BELOW KNEE AMPUTATION;  Surgeon: Nadara Mustard, MD;  Location: [facility] OR;  Service: Orthopedics;  Laterality: Left;   ANKLE FUSION Left 02/21/2023   Procedure: REVISION LEFT ANKLE FUSION;  Surgeon: Nadara Mustard, MD;   Location: [facility];  Service: Orthopedics;  Laterality: Left;   CESAREAN SECTION  2001  &  1984   CO2 LASER APPLICATION N/A 11/26/2015   Procedure: CO2 LASER APPLICATION AND WIDE VOCAL EXCSION OF LESION ON MONS PUBIS;  Surgeon: De Blanch, MD;  Location: [facility];  Service: Gynecology;  Laterality: N/A;   CASE CANCELLED    CO2 LASER APPLICATION N/A 01/07/2016   Procedure: CO2 LASER OF VULVAR;  Surgeon: De Blanch, MD;  Location: [facility];  Service: Gynecology;  Laterality: N/A;   CORNEAL TRANSPLANT Left 12/18/2004   failed   D & C HYSTEROSCOPY /  RESECTION FIBROID  12/18/2002   DILATATION & CURETTAGE/HYSTEROSCOPY WITH MYOSURE N/A 10/01/2019   Procedure: DILATATION & CURETTAGE/HYSTEROSCOPY WITH MYOSURE and HYDROTHERMAL ABLATION;  Surgeon: Geryl Rankins, MD;  Location: [facility];  Service: Gynecology;  Laterality: N/A;  HTA rep will be here. Confirmed on 09/25/19 CS   EYE SURGERY     FOOT ARTHRODESIS Left 11/30/2021   Procedure: LEFT TIBIOCALCANEAL FUSION;  Surgeon: Nadara Mustard, MD;  Location: [facility] OR;  Service: Orthopedics;  Laterality: Left;   HARDWARE REMOVAL Left 04/04/2019   Procedure: EXCISION BONE AND REMOVE DEEP HARDWARE LEFT 5TH METATARSAL;  Surgeon: Nadara Mustard, MD;  [facility];  Service: Orthopedics;  Laterality: Left;   HARDWARE REMOVAL Left 08/04/2022   Procedure: REMOVAL DEEP HARDWARE LEFT ANKLE;  Surgeon: Nadara Mustard, MD;  Location: [facility] OR;  Service: Orthopedics;  Laterality: Left;   I & D EXTREMITY Left 06/27/2021   Procedure: IRRIGATION AND DEBRIDEMENT FOOT ULCER;  Surgeon: Kerrin Champagne, MD;  Location: [facility];  Service: Orthopedics;  Laterality: Left;   KNEE ARTHROSCOPY W/ ACL RECONSTRUCTION Bilateral 12/18/2006   ORIF TOE FRACTURE Left 08/02/2017   Procedure: OPEN REDUCTION INTERNAL FIXATION (ORIF) FIFTH METATARSAL (TOE) BASE FRACTURE;  Surgeon: Toni Arthurs, MD;  Location: [facility];  Service: Orthopedics;  Laterality: Left;   VULVECTOMY N/A 01/07/2016   Procedure: WIDE LOCAL EXCISION;  Surgeon: De Blanch, MD;  Location: [facility];  Service: Gynecology;  Laterality: N/A;  mons pubis as site   Social History   Occupational History   Not on file  Tobacco Use   Smoking status: Never   Smokeless tobacco: Never  Vaping Use   Vaping status: Never Used  Substance and Sexual Activity   Alcohol use: No    Alcohol/week: 0.0 standard drinks of alcohol   Drug use: No   Sexual activity: Yes    Partners: Male    Birth control/protection: Condom, Post-menopausal

## 2023-08-27 ENCOUNTER — Other Ambulatory Visit: Payer: Self-pay | Admitting: Internal Medicine

## 2023-08-27 DIAGNOSIS — Z1231 Encounter for screening mammogram for malignant neoplasm of breast: Secondary | ICD-10-CM

## 2023-08-29 DIAGNOSIS — E1142 Type 2 diabetes mellitus with diabetic polyneuropathy: Secondary | ICD-10-CM | POA: Diagnosis not present

## 2023-08-29 DIAGNOSIS — E1122 Type 2 diabetes mellitus with diabetic chronic kidney disease: Secondary | ICD-10-CM | POA: Diagnosis not present

## 2023-08-29 DIAGNOSIS — K6282 Dysplasia of anus: Secondary | ICD-10-CM | POA: Diagnosis not present

## 2023-08-29 DIAGNOSIS — H5452A1 Low vision left eye category 1, normal vision right eye: Secondary | ICD-10-CM | POA: Diagnosis not present

## 2023-08-29 DIAGNOSIS — N1831 Chronic kidney disease, stage 3a: Secondary | ICD-10-CM | POA: Diagnosis not present

## 2023-08-29 DIAGNOSIS — Z89512 Acquired absence of left leg below knee: Secondary | ICD-10-CM | POA: Diagnosis not present

## 2023-08-29 DIAGNOSIS — Z794 Long term (current) use of insulin: Secondary | ICD-10-CM | POA: Diagnosis not present

## 2023-09-22 ENCOUNTER — Ambulatory Visit
Admission: RE | Admit: 2023-09-22 | Discharge: 2023-09-22 | Disposition: A | Discharge status: Home / Self Care | Payer: 59 | Source: Ambulatory Visit | Attending: Internal Medicine | Admitting: Internal Medicine

## 2023-09-22 DIAGNOSIS — Z1231 Encounter for screening mammogram for malignant neoplasm of breast: Secondary | ICD-10-CM | POA: Diagnosis not present

## 2023-09-26 ENCOUNTER — Ambulatory Visit: Payer: 59

## 2023-10-16 ENCOUNTER — Encounter: Payer: Self-pay | Admitting: Family

## 2023-10-16 ENCOUNTER — Other Ambulatory Visit (INDEPENDENT_AMBULATORY_CARE_PROVIDER_SITE_OTHER): Payer: 59

## 2023-10-16 ENCOUNTER — Ambulatory Visit (INDEPENDENT_AMBULATORY_CARE_PROVIDER_SITE_OTHER): Payer: 59 | Admitting: Orthopedic Surgery

## 2023-10-16 DIAGNOSIS — M25562 Pain in left knee: Secondary | ICD-10-CM | POA: Diagnosis not present

## 2023-10-16 DIAGNOSIS — Z89512 Acquired absence of left leg below knee: Secondary | ICD-10-CM | POA: Diagnosis not present

## 2023-10-16 DIAGNOSIS — S86811S Strain of other muscle(s) and tendon(s) at lower leg level, right leg, sequela: Secondary | ICD-10-CM

## 2023-10-16 NOTE — Progress Notes (Signed)
Office Visit Note   Patient: Katrina Wolfe           Date of Birth: [birth year]           MRN: [id number] Visit Date: 10/16/2023              Requested by: Georgann Housekeeper, MD [address] [place],  [place] [address] PCP: Georgann Housekeeper, MD  Chief Complaint  Patient presents with   Left Leg - Follow-up    06/27/23 left BKA      HPI: Patient is a 57 year old woman who is 4 months status post left below-knee amputation.  Patient had a patella tendon rupture over 2 years ago on the right.  Patient states she does not have active extension of the right knee.  Assessment & Plan: Visit Diagnoses:  1. Acute pain of left knee   2. S/P BKA (below knee amputation) unilateral, left (HCC)   3. Rupture of right patellar tendon, sequela     Plan: Patient will proceed with prosthetic fitting for her left BKA.  Patient will need a knee brace on the right with active extension.  Prescription provided for a brace.  Patient states she is picking up her leg this week and then will begin therapy.  Follow-Up Instructions: Return in about 2 months (around 12/16/2023).   Ortho Exam  Patient is alert, oriented, no adenopathy, well-dressed, normal affect, normal respiratory effort. Examination left knee patient has full active flexion and extension no laxity she does have patella alta but the extensor mechanism is intact.  On the right knee patient has a chronic right patella tendon rupture.  She has no active extension.  She has quad tendon atrophy.  Imaging: XR Knee 1-2 Views Left  Result Date: 10/16/2023 2 view radiographs of the left knee shows patella alta.  No acute fractures.  No images are attached to the encounter.  Labs: Lab Results  Component Value Date   HGBA1C 7.7 (H) 06/27/2023   HGBA1C 8.5 (H) 02/21/2023   HGBA1C 8.2 (H) 11/30/2021   ESRSEDRATE 53 (H) 08/15/2021   ESRSEDRATE 74 (H) 06/30/2021   CRP 18.5 (H) 08/15/2021   CRP 0.9 06/30/2021   REPTSTATUS  07/03/2021 FINAL 06/27/2021   GRAMSTAIN  06/27/2021    RARE WBC PRESENT,BOTH PMN AND MONONUCLEAR NO ORGANISMS SEEN    CULT  06/27/2021    RARE CORYNEBACTERIUM SPECIES Standardized susceptibility testing for this organism is not available. NO ANAEROBES ISOLATED Performed at [facility], [address], [place], [place] [address]    LABORGA PSEUDOMONAS AERUGINOSA 04/04/2019   LABORGA SERRATIA MARCESCENS 04/04/2019   LABORGA STAPHYLOCOCCUS AUREUS 04/04/2019     Lab Results  Component Value Date   ALBUMIN 3.4 (L) 06/27/2023   ALBUMIN 3.5 08/04/2022   ALBUMIN 2.8 (L) 07/02/2021   PREALBUMIN 32 06/27/2023    Lab Results  Component Value Date   MG 1.6 (L) 07/04/2021   MG 1.5 (L) 07/03/2021   MG 1.6 (L) 07/02/2021   No results found for: "VD25OH"  Lab Results  Component Value Date   PREALBUMIN 32 06/27/2023      Latest Ref Rng & Units 06/29/2023    1:50 AM 06/28/2023    3:35 AM 06/27/2023   11:54 AM  CBC EXTENDED  WBC 4.0 - 10.5 K/uL 14.8  13.9  9.5   RBC 3.87 - 5.11 MIL/uL 3.87  3.96  4.71   Hemoglobin 12.0 - 15.0 g/dL 74.2  59.5  63.8  HCT 36.0 - 46.0 % 37.4  37.7  45.5   Platelets 150 - 400 K/uL 311  331  322   NEUT# 1.7 - 7.7 K/uL   6.3   Lymph# 0.7 - 4.0 K/uL   2.3      There is no height or weight on file to calculate BMI.  Orders:  Orders Placed This Encounter  Procedures   XR Knee 1-2 Views Left   No orders of the defined types were placed in this encounter.    Procedures: No procedures performed  Clinical Data: No additional findings.  ROS:  All other systems negative, except as noted in the HPI. Review of Systems  Objective: Vital Signs: LMP 04/27/2021   Specialty Comments:  No specialty comments available.  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/21/2023   S/P ankle fusion 11/30/2021   Charcot's  joint, left ankle and foot    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, with necrosis of bone (HCC)    Diabetes mellitus type 2, uncontrolled, with complications 06/26/2021   Diabetic nephropathy (HCC) 06/26/2021   CKD (chronic kidney disease), stage III (HCC) 06/26/2021   HTN (hypertension) 06/24/2021   HIV (human immunodeficiency virus infection) (HCC) 06/24/2021   DM2 (diabetes mellitus, type 2) (HCC) 06/24/2021   HLD (hyperlipidemia) 06/24/2021   Osteomyelitis of left foot (HCC) 06/24/2021   Elevated LFTs 06/24/2021   Acute-on-chronic kidney injury (HCC) 06/24/2021   Osteomyelitis of foot (HCC) 04/04/2019   Hardware complicating wound infection (HCC)    Subacute osteomyelitis, left ankle and foot (HCC)    Open displaced fracture of fifth metatarsal bone of left foot    Healthcare maintenance 04/03/2019   HIV disease (HCC) 10/27/2015   DM type 2 (diabetes mellitus, type 2) (HCC) 10/27/2015   Hyperlipidemia 10/27/2015   Essential hypertension 10/27/2015   Severe vulvar dysplasia, histologically confirmed 10/15/2015   Past Medical History:  Diagnosis Date   Abnormal uterine bleeding (AUB)    Arthritis    knees, fingers   Asthma    as child, no problem since 1995   Bell's palsy 2004   CKD (chronic kidney disease), stage II    Complication of anesthesia    Congenital cardiomegaly    per pt has always been told since childhood and siblings    Diabetes mellitus without complication (HCC)    Dysrhythmia    Occasionally skipped beats   Elevated liver function tests    GERD (gastroesophageal reflux disease)    History of genital warts    HIV (human immunodeficiency virus infection) (HCC)    DX 1998--  MONITORED BY INFECTIOUS DISEASE-- DR Judyann Munson   Hypercholesterolemia    Hypertension    Intermittent palpitations    mild-- no meds   Legally blind in left eye, as defined in [place]    trauma as child   Peripheral vascular  disease (HCC)    Pneumonia    PONV (postoperative nausea and vomiting)    Renal disorder    Tuberculosis    ? 2005 or 2007   Type 2 diabetes mellitus (HCC)    Type II    Uterine fibroid    VIN III (vulvar intraepithelial neoplasia III)    and Verrucoid lesion on the mons   Wears glasses     Family History  Problem Relation Age of  Onset   Hypertension Mother    Diabetes Father    Cancer Brother    Breast cancer Cousin     Past Surgical History:  Procedure Laterality Date   AMPUTATION Left 06/27/2023   Procedure: LEFT BELOW KNEE AMPUTATION;  Surgeon: Nadara Mustard, MD;  Location: [facility] OR;  Service: Orthopedics;  Laterality: Left;   ANKLE FUSION Left 02/21/2023   Procedure: REVISION LEFT ANKLE FUSION;  Surgeon: Nadara Mustard, MD;  Location: [facility];  Service: Orthopedics;  Laterality: Left;   CESAREAN SECTION  2001  &  1984   CO2 LASER APPLICATION N/A 11/26/2015   Procedure: CO2 LASER APPLICATION AND WIDE VOCAL EXCSION OF LESION ON MONS PUBIS;  Surgeon: De Blanch, MD;  Location: [facility];  Service: Gynecology;  Laterality: N/A;   CASE CANCELLED    CO2 LASER APPLICATION N/A 01/07/2016   Procedure: CO2 LASER OF VULVAR;  Surgeon: De Blanch, MD;  [facility]  Service: Gynecology;  Laterality: N/A;   CORNEAL TRANSPLANT Left 12/18/2004   failed   D & C HYSTEROSCOPY /  RESECTION FIBROID  12/18/2002   DILATATION & CURETTAGE/HYSTEROSCOPY WITH MYOSURE N/A 10/01/2019   Procedure: DILATATION & CURETTAGE/HYSTEROSCOPY WITH MYOSURE and HYDROTHERMAL ABLATION;  Surgeon: Geryl Rankins, MD;  Location: [facility];  Service: Gynecology;  Laterality: N/A;  HTA rep will be here. Confirmed on 09/25/19 CS   EYE SURGERY     FOOT ARTHRODESIS Left 11/30/2021   Procedure: LEFT TIBIOCALCANEAL FUSION;  Surgeon: Nadara Mustard, MD;  Location: [facility] OR;  Service: Orthopedics;  Laterality: Left;   HARDWARE REMOVAL Left 04/04/2019    Procedure: EXCISION BONE AND REMOVE DEEP HARDWARE LEFT 5TH METATARSAL;  Surgeon: Nadara Mustard, MD;  [facility];  Service: Orthopedics;  Laterality: Left;   HARDWARE REMOVAL Left 08/04/2022   Procedure: REMOVAL DEEP HARDWARE LEFT ANKLE;  Surgeon: Nadara Mustard, MD;  [facility];  Service: Orthopedics;  Laterality: Left;   I & D EXTREMITY Left 06/27/2021   Procedure: IRRIGATION AND DEBRIDEMENT FOOT ULCER;  Surgeon: Kerrin Champagne, MD;  Location: [facility];  Service: Orthopedics;  Laterality: Left;   KNEE ARTHROSCOPY W/ ACL RECONSTRUCTION Bilateral 12/18/2006   ORIF TOE FRACTURE Left 08/02/2017   Procedure: OPEN REDUCTION INTERNAL FIXATION (ORIF) FIFTH METATARSAL (TOE) BASE FRACTURE;  Surgeon: Toni Arthurs, MD;  Location: [facility];  Service: Orthopedics;  Laterality: Left;   VULVECTOMY N/A 01/07/2016   Procedure: WIDE LOCAL EXCISION;  Surgeon: De Blanch, MD;  Location: [facility];  Service: Gynecology;  Laterality: N/A;  mons pubis as site   Social History   Occupational History   Not on file  Tobacco Use   Smoking status: Never   Smokeless tobacco: Never  Vaping Use   Vaping status: Never Used  Substance and Sexual Activity   Alcohol use: No    Alcohol/week: 0.0 standard drinks of alcohol   Drug use: No   Sexual activity: Yes    Partners: Male    Birth control/protection: Condom, Post-menopausal

## 2023-10-17 DIAGNOSIS — E785 Hyperlipidemia, unspecified: Secondary | ICD-10-CM | POA: Diagnosis not present

## 2023-10-17 DIAGNOSIS — I129 Hypertensive chronic kidney disease with stage 1 through stage 4 chronic kidney disease, or unspecified chronic kidney disease: Secondary | ICD-10-CM | POA: Diagnosis not present

## 2023-10-17 DIAGNOSIS — N1831 Chronic kidney disease, stage 3a: Secondary | ICD-10-CM | POA: Diagnosis not present

## 2023-10-17 DIAGNOSIS — E1122 Type 2 diabetes mellitus with diabetic chronic kidney disease: Secondary | ICD-10-CM | POA: Diagnosis not present

## 2023-10-18 DIAGNOSIS — Z89512 Acquired absence of left leg below knee: Secondary | ICD-10-CM | POA: Diagnosis not present

## 2023-10-22 ENCOUNTER — Other Ambulatory Visit: Payer: Self-pay

## 2023-10-22 ENCOUNTER — Ambulatory Visit: Payer: 59 | Admitting: Internal Medicine

## 2023-10-22 VITALS — BP 120/85 | HR 94 | Temp 98.0°F

## 2023-10-22 DIAGNOSIS — B20 Human immunodeficiency virus [HIV] disease: Secondary | ICD-10-CM | POA: Diagnosis not present

## 2023-10-22 MED ORDER — DOVATO 50-300 MG PO TABS: ORAL_TABLET | ORAL | 11 refills | Status: DC

## 2023-10-22 NOTE — Progress Notes (Signed)
RFV: follow up for hiv disease  Patient ID: Katrina Wolfe, female   DOB: 03/20/1966, 57 y.o.   MRN: [id number]  HPI Katrina Wolfe is a  57yo F with well controlled hiv disease, CD 4 count of 698/VL<20 in may 2024, currently on dovato. Hx of left bka. Left eye blindness  Was on trulicity was contributing to some CKD so now changed to ozempic. Feels like she has lost 6 lbs.  Doesn't necessarily have much side effects from it.   Has episodes of hypoglycemia of 40 in early morning. Now insulin taking only once aday instead of bid.   Outpatient Encounter Medications as of 10/22/2023  Medication Sig   acetaminophen (TYLENOL) 500 MG tablet Take 1,000 mg by mouth every 6 (six) hours as needed for moderate pain.   amLODipine (NORVASC) 5 MG tablet Take 5 mg by mouth in the morning.   aspirin EC 81 MG tablet Take 81 mg by mouth daily. Swallow whole.   calcium carbonate (TUMS - DOSED IN MG ELEMENTAL CALCIUM) 500 MG chewable tablet Chew 1 tablet by mouth as needed for indigestion or heartburn.   Calcium Carbonate-Vitamin D 600-200 MG-UNIT TABS Take 1 tablet by mouth daily.   COMFORT EZ PEN NEEDLES 31G X 8 MM MISC USE WITH insulin THREE TIMES DAILY AS DIRECTED   Continuous Blood Gluc Receiver (DEXCOM G6 RECEIVER) DEVI as directed 30 days   diphenhydrAMINE (BENADRYL) 25 MG tablet Take 25 mg by mouth every 6 (six) hours as needed for allergies (or allergic reactions).   dolutegravir-lamiVUDine (DOVATO) 50-300 MG tablet TAKE ONE TABLET BY MOUTH EVERYDAY AT BEDTIME   gabapentin (NEURONTIN) 100 MG capsule Take 100 mg by mouth 4 (four) times daily.   glimepiride (AMARYL) 2 MG tablet Take 2 mg by mouth daily with breakfast.   HUMALOG MIX 75/25 KWIKPEN (75-25) 100 UNIT/ML Kwikpen Inject 45 Units into the skin 2 (two) times daily. Breakfast and Dinner   Insulin Syringe-Needle U-100 (INSULIN SYRINGE 1CC/31GX5/16") 31G X 5/16" 1 ML MISC 1 Units by Does not apply route 2 (two) times daily.   Lancets (ONETOUCH  ULTRASOFT) lancets    lisinopril-hydrochlorothiazide (ZESTORETIC) 20-25 MG tablet Take 1 tablet by mouth daily.   norethindrone (AYGESTIN) 5 MG tablet Take 5 mg by mouth in the morning and at bedtime.   ONE TOUCH ULTRA TEST test strip    pantoprazole (PROTONIX) 20 MG tablet Take 20 mg by mouth every morning.   pioglitazone (ACTOS) 30 MG tablet Take 30 mg by mouth every morning.   rosuvastatin (CRESTOR) 5 MG tablet Take 5 mg by mouth daily with lunch.   traMADol (ULTRAM) 50 MG tablet Take 50 mg by mouth 3 (three) times daily as needed.   Dulaglutide (TRULICITY) 4.5 MG/0.5ML SOPN Inject 4.5 mg into the skin every Friday. (Patient not taking: Reported on 10/22/2023)   oxyCODONE (OXY IR/ROXICODONE) 5 MG immediate release tablet Take 1 tablet (5 mg total) by mouth every 4 (four) hours as needed for severe pain. (Patient not taking: Reported on 10/22/2023)   No facility-administered encounter medications on file as of 10/22/2023.     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, with necrosis of bone (HCC)    Diabetes mellitus  type 2, uncontrolled, with complications 06/26/2021   Diabetic nephropathy (HCC) 06/26/2021   CKD (chronic kidney disease), stage III (HCC) 06/26/2021   HTN (hypertension) 06/24/2021   HIV (human immunodeficiency virus infection) (HCC) 06/24/2021   DM2 (diabetes mellitus, type 2) (HCC) 06/24/2021   HLD (hyperlipidemia) 06/24/2021   Osteomyelitis of left foot (HCC) 06/24/2021   Elevated LFTs 06/24/2021   Acute-on-chronic kidney injury (HCC) 06/24/2021   Osteomyelitis of foot (HCC) 04/04/2019   Hardware complicating wound infection (HCC)    Subacute osteomyelitis, left ankle and foot (HCC)    Open  displaced fracture of fifth metatarsal bone of left foot    Healthcare maintenance 04/03/2019   HIV disease (HCC) 10/27/2015   DM type 2 (diabetes mellitus, type 2) (HCC) 10/27/2015   Hyperlipidemia 10/27/2015   Essential hypertension 10/27/2015   Severe vulvar dysplasia, histologically confirmed 10/15/2015     Health Maintenance Due  Topic Date Due   COVID-19 Vaccine (1) Never done   FOOT EXAM  Never done   OPHTHALMOLOGY EXAM  Never done   Zoster Vaccines- Shingrix (1 of 2) 08/16/1985   Diabetic kidney evaluation - Urine ACR  05/17/2023   INFLUENZA VACCINE  07/19/2023     Review of Systems  Constitutional: Negative for fever, chills, diaphoresis, activity change, appetite change, fatigue and unexpected weight change.  HENT: Negative for congestion, sore throat, rhinorrhea, sneezing, trouble swallowing and sinus pressure.  Eyes: Negative for photophobia and visual disturbance.  Respiratory: Negative for cough, chest tightness, shortness of breath, wheezing and stridor.  Cardiovascular: Negative for chest pain, palpitations and leg swelling.  Gastrointestinal: Negative for nausea, vomiting, abdominal pain, diarrhea,but has acid reflux Genitourinary: Negative for dysuria, hematuria, flank pain and difficulty urinating.  Musculoskeletal: Negative for myalgias, back pain, joint swelling, arthralgias and gait problem.  Skin: Negative for color change, pallor, rash and wound.  Neurological: Negative for dizziness, tremors, weakness and light-headedness.  Hematological: Negative for adenopathy. Does not bruise/bleed easily.  Psychiatric/Behavioral: Negative for behavioral problems, confusion, sleep disturbance, dysphoric mood, decreased concentration and agitation.   Physical Exam   BP 120/85   Pulse 94   Temp 98 F (36.7 C) (Temporal)   LMP 04/27/2021   SpO2 96%   Physical Exam  Constitutional:  oriented to person, place, and time. appears well-developed and well-nourished. No  distress.  HENT: Bowlegs/AT, PERRLA, no scleral icterus. Left eye blindness Mouth/Throat: Oropharynx is clear and moist. No oropharyngeal exudate.  Cardiovascular: Normal rate, regular rhythm and normal heart sounds. Exam reveals no gallop and no friction rub.  No murmur heard.  Pulmonary/Chest: Effort normal and breath sounds normal. No respiratory distress.  has no wheezes.  Neck = supple, no nuchal rigidity Ext: left bka Neurological: alert and oriented to person, place, and time.  Skin: Skin is warm and dry. No rash noted. No erythema.  Psychiatric: a normal mood and affect.  behavior is normal.   Lab Results  Component Value Date   CD4TCELL 33 04/19/2023   Lab Results  Component Value Date   CD4TABS 698 04/19/2023   CD4TABS 575 10/17/2022   CD4TABS 638 05/19/2021   Lab Results  Component Value Date   HIV1RNAQUANT Not Detected 04/19/2023   Lab Results  Component Value Date   HEPBSAB NEG 10/13/2015   Lab Results  Component Value Date   LABRPR NON-REACTIVE 10/17/2022    CBC Lab Results  Component Value Date   WBC 14.8 (H) 06/29/2023   RBC 3.87 06/29/2023   HGB 12.8 06/29/2023  HCT 37.4 06/29/2023   PLT 311 06/29/2023   MCV 96.6 06/29/2023   MCH 33.1 06/29/2023   MCHC 34.2 06/29/2023   RDW 15.0 06/29/2023   LYMPHSABS 2.3 06/27/2023   MONOABS 0.7 06/27/2023   EOSABS 0.1 06/27/2023    BMET Lab Results  Component Value Date   NA 133 (L) 06/29/2023   K 3.9 06/29/2023   CL 102 06/29/2023   CO2 20 (L) 06/29/2023   GLUCOSE 214 (H) 06/29/2023   BUN 13 06/29/2023   CREATININE 1.29 (H) 06/29/2023   CALCIUM 8.2 (L) 06/29/2023   GFRNONAA 49 (L) 06/29/2023   GFRAA 48 (L) 02/22/2021      Assessment and Plan   Hiv disease = will check cd 4 count and hiv VL  Long term medication management = track labs down to find cr (done last week)  Health maintenance = defer covid and flu shot; up todate to pap and mammo.   GERD = having more reflux -- going to see  gastro this week; pantoprazole not working. And recommend to take famotidine at night   I have personally spent 34 minutes involved in face-to-face and non-face-to-face activities for this patient on the day of the visit. Professional time spent includes the following activities: Preparing to see the patient (review of tests), Obtaining and/or reviewing separately obtained history (admission/discharge record), Performing a medically appropriate examination and/or evaluation , Ordering medications/tests/procedures, referring and communicating with other health care professionals, Documenting clinical information in the EMR, Independently interpreting results (not separately reported), Communicating results to the patient/family/caregiver, Counseling and educating the patient/family/caregiver and Care coordination (not separately reported).

## 2023-10-23 LAB — T-HELPER CELL (CD4) - (RCID CLINIC ONLY)
CD4 % Helper T Cell: 32 % — ABNORMAL LOW (ref 33–65)
CD4 T Cell Abs: 739 /uL (ref 400–1790)

## 2023-10-24 LAB — HIV-1 RNA QUANT-NO REFLEX-BLD
HIV 1 RNA Quant: <20 {copies}/mL — ABNORMAL HIGH
HIV-1 RNA Quant, Log: <1.3 {Log_copies}/mL — ABNORMAL HIGH

## 2023-10-30 DIAGNOSIS — K6282 Dysplasia of anus: Secondary | ICD-10-CM | POA: Diagnosis not present

## 2023-10-30 DIAGNOSIS — Z860101 Personal history of adenomatous and serrated colon polyps: Secondary | ICD-10-CM | POA: Diagnosis not present

## 2023-11-05 DIAGNOSIS — M2351 Chronic instability of knee, right knee: Secondary | ICD-10-CM | POA: Diagnosis not present

## 2023-11-20 DIAGNOSIS — K6389 Other specified diseases of intestine: Secondary | ICD-10-CM | POA: Diagnosis not present

## 2023-11-20 DIAGNOSIS — K648 Other hemorrhoids: Secondary | ICD-10-CM | POA: Diagnosis not present

## 2023-11-21 ENCOUNTER — Other Ambulatory Visit: Payer: Self-pay | Admitting: Obstetrics and Gynecology

## 2023-11-21 ENCOUNTER — Other Ambulatory Visit (HOSPITAL_COMMUNITY)
Admission: RE | Admit: 2023-11-21 | Discharge: 2023-11-21 | Disposition: A | Discharge status: Home / Self Care | Payer: 59 | Source: Ambulatory Visit | Attending: Obstetrics and Gynecology | Admitting: Obstetrics and Gynecology

## 2023-11-21 DIAGNOSIS — Z9189 Other specified personal risk factors, not elsewhere classified: Secondary | ICD-10-CM | POA: Diagnosis not present

## 2023-11-21 DIAGNOSIS — Z1151 Encounter for screening for human papillomavirus (HPV): Secondary | ICD-10-CM | POA: Insufficient documentation

## 2023-11-21 DIAGNOSIS — Z01419 Encounter for gynecological examination (general) (routine) without abnormal findings: Secondary | ICD-10-CM | POA: Insufficient documentation

## 2023-11-21 DIAGNOSIS — R87610 Atypical squamous cells of undetermined significance on cytologic smear of cervix (ASC-US): Secondary | ICD-10-CM | POA: Diagnosis not present

## 2023-11-23 DIAGNOSIS — K6389 Other specified diseases of intestine: Secondary | ICD-10-CM | POA: Diagnosis not present

## 2023-11-27 LAB — CYTOLOGY - PAP
Comment: NEGATIVE
Diagnosis: UNDETERMINED — AB
High risk HPV: NEGATIVE

## 2023-12-17 ENCOUNTER — Ambulatory Visit (INDEPENDENT_AMBULATORY_CARE_PROVIDER_SITE_OTHER): Payer: 59 | Admitting: Orthopedic Surgery

## 2023-12-17 ENCOUNTER — Encounter: Payer: Self-pay | Admitting: Orthopedic Surgery

## 2023-12-17 DIAGNOSIS — Z89512 Acquired absence of left leg below knee: Secondary | ICD-10-CM

## 2023-12-17 DIAGNOSIS — M25562 Pain in left knee: Secondary | ICD-10-CM | POA: Diagnosis not present

## 2023-12-17 NOTE — Progress Notes (Signed)
Office Visit Note   Patient: Katrina Wolfe           Date of Birth: Jan 05, 1966           MRN: [id number] Visit Date: 12/17/2023              Requested by: Katrina Housekeeper, MD [address] [place],  [place] [address] PCP: Katrina Housekeeper, MD  Chief Complaint  Patient presents with   Left Leg - Follow-up    06/27/23 left BKA      HPI: Patient is a 57 year old woman who is 6 months status post left below-knee amputation.  Patient complains of some burning pain over the lateral aspect of the residual limb.  Assessment & Plan: Visit Diagnoses:  1. S/P BKA (below knee amputation) unilateral, left (HCC)   2. Acute pain of left knee     Plan: Will make an appointment for physical therapy and gait training.  Follow-Up Instructions: Return in about 2 months (around 02/15/2024).   Ortho Exam  Patient is alert, oriented, no adenopathy, well-dressed, normal affect, normal respiratory effort. Examination there is no redness cellulitis or swelling no open ulcers.  There is no tenderness to palpation.  Patient does have decreased muscle tone with the residual limb.  This is most likely the source of her pain.  Imaging: No results found. No images are attached to the encounter.  Labs: Lab Results  Component Value Date   HGBA1C 7.7 (H) 06/27/2023   HGBA1C 8.5 (H) 02/21/2023   HGBA1C 8.2 (H) 11/30/2021   ESRSEDRATE 53 (H) 08/15/2021   ESRSEDRATE 74 (H) 06/30/2021   CRP 18.5 (H) 08/15/2021   CRP 0.9 06/30/2021   REPTSTATUS 07/03/2021 FINAL 06/27/2021   GRAMSTAIN  06/27/2021    RARE WBC PRESENT,BOTH PMN AND MONONUCLEAR NO ORGANISMS SEEN    CULT  06/27/2021    RARE CORYNEBACTERIUM SPECIES Standardized susceptibility testing for this organism is not available. NO ANAEROBES ISOLATED Performed at [facility], [address], [place], [place] [address]    LABORGA PSEUDOMONAS AERUGINOSA 04/04/2019   LABORGA SERRATIA MARCESCENS 04/04/2019   LABORGA  STAPHYLOCOCCUS AUREUS 04/04/2019     Lab Results  Component Value Date   ALBUMIN 3.4 (L) 06/27/2023   ALBUMIN 3.5 08/04/2022   ALBUMIN 2.8 (L) 07/02/2021   PREALBUMIN 32 06/27/2023    Lab Results  Component Value Date   MG 1.6 (L) 07/04/2021   MG 1.5 (L) 07/03/2021   MG 1.6 (L) 07/02/2021   No results found for: "VD25OH"  Lab Results  Component Value Date   PREALBUMIN 32 06/27/2023      Latest Ref Rng & Units 06/29/2023    1:50 AM 06/28/2023    3:35 AM 06/27/2023   11:54 AM  CBC EXTENDED  WBC 4.0 - 10.5 K/uL 14.8  13.9  9.5   RBC 3.87 - 5.11 MIL/uL 3.87  3.96  4.71   Hemoglobin 12.0 - 15.0 g/dL 82.9  56.2  13.0   HCT 36.0 - 46.0 % 37.4  37.7  45.5   Platelets 150 - 400 K/uL 311  331  322   NEUT# 1.7 - 7.7 K/uL   6.3   Lymph# 0.7 - 4.0 K/uL   2.3      There is no height or weight on file to calculate BMI.  Orders:  Orders Placed This Encounter  Procedures   Ambulatory referral to Physical Therapy   No orders of the defined types were placed in  this encounter.    Procedures: No procedures performed  Clinical Data: No additional findings.  ROS:  All other systems negative, except as noted in the HPI. Review of Systems  Objective: Vital Signs: LMP 04/27/2021   Specialty Comments:  No specialty comments available.  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, with necrosis of bone (HCC)    Diabetes mellitus type 2, uncontrolled, with complications 06/26/2021   Diabetic nephropathy (HCC) 06/26/2021   CKD (chronic kidney disease), stage III (HCC) 06/26/2021   HTN (hypertension) 06/24/2021   HIV (human immunodeficiency virus infection) (HCC) 06/24/2021   DM2  (diabetes mellitus, type 2) (HCC) 06/24/2021   HLD (hyperlipidemia) 06/24/2021   Osteomyelitis of left foot (HCC) 06/24/2021   Elevated LFTs 06/24/2021   Acute-on-chronic kidney injury (HCC) 06/24/2021   Osteomyelitis of foot (HCC) 04/04/2019   Hardware complicating wound infection (HCC)    Subacute osteomyelitis, left ankle and foot (HCC)    Open displaced fracture of fifth metatarsal bone of left foot    Healthcare maintenance 04/03/2019   HIV disease (HCC) 10/27/2015   DM type 2 (diabetes mellitus, type 2) (HCC) 10/27/2015   Hyperlipidemia 10/27/2015   Essential hypertension 10/27/2015   Severe vulvar dysplasia, histologically confirmed 10/15/2015   Past Medical History:  Diagnosis Date   Abnormal uterine bleeding (AUB)    Arthritis    knees, fingers   Asthma    as child, no problem since 1995   Bell's palsy 2004   CKD (chronic kidney disease), stage II    Complication of anesthesia    Congenital cardiomegaly    per pt has always been told since childhood and siblings    Diabetes mellitus without complication (HCC)    Dysrhythmia    Occasionally skipped beats   Elevated liver function tests    GERD (gastroesophageal reflux disease)    History of genital warts    HIV (human immunodeficiency virus infection) (HCC)    DX 1998--  MONITORED BY INFECTIOUS DISEASE-- DR Katrina Wolfe   Hypercholesterolemia    Hypertension    Intermittent palpitations    mild-- no meds   Legally blind in left eye, as defined in [place]    trauma as child   Peripheral vascular disease (HCC)    Pneumonia    PONV (postoperative nausea and vomiting)    Renal disorder    Tuberculosis    ? 2005 or 2007   Type 2 diabetes mellitus (HCC)    Type II    Uterine fibroid    VIN III (vulvar intraepithelial neoplasia III)    and Verrucoid lesion on the mons   Wears glasses     Family History  Problem Relation Age of Onset   Hypertension Mother    Diabetes Father    Cancer Brother    Breast cancer  Cousin     Past Surgical History:  Procedure Laterality Date   AMPUTATION Left 06/27/2023   Procedure: LEFT BELOW KNEE AMPUTATION;  Surgeon: Katrina Mustard, MD;  Location: [facility] OR;  Service: Orthopedics;  Laterality: Left;   ANKLE FUSION Left 02/21/2023   Procedure: REVISION LEFT ANKLE FUSION;  Surgeon: Katrina Mustard, MD;  Location: [facility] OR;  Service:  Orthopedics;  Laterality: Left;   CESAREAN SECTION  2001  &  1984   CO2 LASER APPLICATION N/A 11/26/2015   Procedure: CO2 LASER APPLICATION AND WIDE VOCAL EXCSION OF LESION ON MONS PUBIS;  Surgeon: De Blanch, MD;  Location: [facility] [place];  Service: Gynecology;  Laterality: N/A;   CASE CANCELLED    CO2 LASER APPLICATION N/A 01/07/2016   Procedure: CO2 LASER OF VULVAR;  Surgeon: De Blanch, MD;  Location: [facility];  Service: Gynecology;  Laterality: N/A;   CORNEAL TRANSPLANT Left 12/18/2004   failed   D & C HYSTEROSCOPY /  RESECTION FIBROID  12/18/2002   DILATATION & CURETTAGE/HYSTEROSCOPY WITH MYOSURE N/A 10/01/2019   Procedure: DILATATION & CURETTAGE/HYSTEROSCOPY WITH MYOSURE and HYDROTHERMAL ABLATION;  Surgeon: Geryl Rankins, MD;  Location: [facility];  Service: Gynecology;  Laterality: N/A;  HTA rep will be here. Confirmed on 09/25/19 CS   EYE SURGERY     FOOT ARTHRODESIS Left 11/30/2021   Procedure: LEFT TIBIOCALCANEAL FUSION;  Surgeon: Katrina Mustard, MD;  Location: [facility] OR;  Service: Orthopedics;  Laterality: Left;   HARDWARE REMOVAL Left 04/04/2019   Procedure: EXCISION BONE AND REMOVE DEEP HARDWARE LEFT 5TH METATARSAL;  Surgeon: Katrina Mustard, MD;  Location: [facility] OR;  Service: Orthopedics;  Laterality: Left;   HARDWARE REMOVAL Left 08/04/2022   Procedure: REMOVAL DEEP HARDWARE LEFT ANKLE;  Surgeon: Katrina Mustard, MD;  Location[facility] OR;  Service: Orthopedics;  Laterality: Left;   I & D EXTREMITY Left 06/27/2021   Procedure: IRRIGATION AND DEBRIDEMENT FOOT ULCER;  Surgeon:  Kerrin Champagne, MD;  Location: [facility] OR;  Service: Orthopedics;  Laterality: Left;   KNEE ARTHROSCOPY W/ ACL RECONSTRUCTION Bilateral 12/18/2006   ORIF TOE FRACTURE Left 08/02/2017   Procedure: OPEN REDUCTION INTERNAL FIXATION (ORIF) FIFTH METATARSAL (TOE) BASE FRACTURE;  Surgeon: Toni Arthurs, MD;  Location: [facility];  Service: Orthopedics;  Laterality: Left;   VULVECTOMY N/A 01/07/2016   Procedure: WIDE LOCAL EXCISION;  Surgeon: De Blanch, MD;  Location: [facility];  Service: Gynecology;  Laterality: N/A;  mons pubis as site   Social History   Occupational History   Not on file  Tobacco Use   Smoking status: Never   Smokeless tobacco: Never  Vaping Use   Vaping status: Never Used  Substance and Sexual Activity   Alcohol use: No    Alcohol/week: 0.0 standard drinks of alcohol   Drug use: No   Sexual activity: Yes    Partners: Male    Birth control/protection: Condom, Post-menopausal

## 2023-12-26 DIAGNOSIS — Z794 Long term (current) use of insulin: Secondary | ICD-10-CM | POA: Diagnosis not present

## 2023-12-26 DIAGNOSIS — E1142 Type 2 diabetes mellitus with diabetic polyneuropathy: Secondary | ICD-10-CM | POA: Diagnosis not present

## 2023-12-26 DIAGNOSIS — Z978 Presence of other specified devices: Secondary | ICD-10-CM | POA: Diagnosis not present

## 2023-12-26 DIAGNOSIS — N1831 Chronic kidney disease, stage 3a: Secondary | ICD-10-CM | POA: Diagnosis not present

## 2023-12-26 DIAGNOSIS — Z23 Encounter for immunization: Secondary | ICD-10-CM | POA: Diagnosis not present

## 2023-12-26 DIAGNOSIS — Z89512 Acquired absence of left leg below knee: Secondary | ICD-10-CM | POA: Diagnosis not present

## 2024-01-01 ENCOUNTER — Ambulatory Visit: Payer: 59

## 2024-01-10 ENCOUNTER — Ambulatory Visit: Payer: 59 | Attending: Orthopedic Surgery

## 2024-01-10 ENCOUNTER — Other Ambulatory Visit: Payer: Self-pay

## 2024-01-10 DIAGNOSIS — G8929 Other chronic pain: Secondary | ICD-10-CM | POA: Diagnosis not present

## 2024-01-10 DIAGNOSIS — R2689 Other abnormalities of gait and mobility: Secondary | ICD-10-CM | POA: Insufficient documentation

## 2024-01-10 DIAGNOSIS — M6281 Muscle weakness (generalized): Secondary | ICD-10-CM | POA: Diagnosis not present

## 2024-01-10 DIAGNOSIS — Z89512 Acquired absence of left leg below knee: Secondary | ICD-10-CM | POA: Diagnosis not present

## 2024-01-10 DIAGNOSIS — M25561 Pain in right knee: Secondary | ICD-10-CM | POA: Diagnosis not present

## 2024-01-10 DIAGNOSIS — R2681 Unsteadiness on feet: Secondary | ICD-10-CM | POA: Insufficient documentation

## 2024-01-10 NOTE — Therapy (Signed)
OUTPATIENT PHYSICAL THERAPY PROSTHETICS EVALUATION   Patient Name: Katrina Wolfe MRN: [id number] DOB:1966/08/03, 58 y.o., female Today's Date: 01/10/2024  PCP: Dr. Tyson Dense REFERRING PROVIDER: Nadara Mustard, MD  END OF SESSION:  PT End of Session - 01/10/24 1022     Visit Number 1    Number of Visits 13    Date for PT Re-Evaluation 03/06/24    Authorization Type [employer] Medicare/ [employer]    PT Start Time 1020    PT Stop Time 1105    PT Time Calculation (min) 45 min    Equipment Utilized During Treatment Gait belt    Activity Tolerance Patient tolerated treatment well    Behavior During Therapy WFL for tasks assessed/performed             Past Medical History:  Diagnosis Date   Abnormal uterine bleeding (AUB)    Arthritis    knees, fingers   Asthma    as child, no problem since 1995   Bell's palsy 2004   CKD (chronic kidney disease), stage II    Complication of anesthesia    Congenital cardiomegaly    per pt has always been told since childhood and siblings    Diabetes mellitus without complication (HCC)    Dysrhythmia    Occasionally skipped beats   Elevated liver function tests    GERD (gastroesophageal reflux disease)    History of genital warts    HIV (human immunodeficiency virus infection) (HCC)    DX 1998--  MONITORED BY INFECTIOUS DISEASE-- DR Judyann Munson   Hypercholesterolemia    Hypertension    Intermittent palpitations    mild-- no meds   Legally blind in left eye, as defined in [place]    trauma as child   Peripheral vascular disease (HCC)    Pneumonia    PONV (postoperative nausea and vomiting)    Renal disorder    Tuberculosis    ? 2005 or 2007   Type 2 diabetes mellitus (HCC)    Type II    Uterine fibroid    VIN III (vulvar intraepithelial neoplasia III)    and Verrucoid lesion on the mons   Wears glasses    Past Surgical History:  Procedure Laterality Date   AMPUTATION Left 06/27/2023   Procedure: LEFT BELOW KNEE  AMPUTATION;  Surgeon: Nadara Mustard, MD;  Location: [facility] OR;  Service: Orthopedics;  Laterality: Left;   ANKLE FUSION Left 02/21/2023   Procedure: REVISION LEFT ANKLE FUSION;  Surgeon: Nadara Mustard, MD;  Location: [facility] OR;  Service: Orthopedics;  Laterality: Left;   CESAREAN SECTION  2001  &  1984   CO2 LASER APPLICATION N/A 11/26/2015   Procedure: CO2 LASER APPLICATION AND WIDE VOCAL EXCSION OF LESION ON MONS PUBIS;  Surgeon: De Blanch, MD;  Location: [facility];  Service: Gynecology;  Laterality: N/A;   CASE CANCELLED    CO2 LASER APPLICATION N/A 01/07/2016   Procedure: CO2 LASER OF VULVAR;  Surgeon: De Blanch, MD;  Location: [facility];  Service: Gynecology;  Laterality: N/A;   CORNEAL TRANSPLANT Left 12/18/2004   failed   D & C HYSTEROSCOPY /  RESECTION FIBROID  12/18/2002   DILATATION & CURETTAGE/HYSTEROSCOPY WITH MYOSURE N/A 10/01/2019   Procedure: DILATATION & CURETTAGE/HYSTEROSCOPY WITH MYOSURE and HYDROTHERMAL ABLATION;  Surgeon: Geryl Rankins, MD;  Location: [facility];  Service: Gynecology;  Laterality: N/A;  HTA rep will be here. Confirmed on 09/25/19 CS   EYE  SURGERY     FOOT ARTHRODESIS Left 11/30/2021   Procedure: LEFT TIBIOCALCANEAL FUSION;  Surgeon: Nadara Mustard, MD;  Location: [facility] OR;  Service: Orthopedics;  Laterality: Left;   HARDWARE REMOVAL Left 04/04/2019   Procedure: EXCISION BONE AND REMOVE DEEP HARDWARE LEFT 5TH METATARSAL;  Surgeon: Nadara Mustard, MD;  Location: [facility] OR;  Service: Orthopedics;  Laterality: Left;   HARDWARE REMOVAL Left 08/04/2022   Procedure: REMOVAL DEEP HARDWARE LEFT ANKLE;  Surgeon: Nadara Mustard, MD;  Location: [facility] OR;  Service: Orthopedics;  Laterality: Left;   I & D EXTREMITY Left 06/27/2021   Procedure: IRRIGATION AND DEBRIDEMENT FOOT ULCER;  Surgeon: Kerrin Champagne, MD;  Location: [facility] OR;  Service: Orthopedics;  Laterality: Left;   KNEE ARTHROSCOPY W/ ACL RECONSTRUCTION  Bilateral 12/18/2006   ORIF TOE FRACTURE Left 08/02/2017   Procedure: OPEN REDUCTION INTERNAL FIXATION (ORIF) FIFTH METATARSAL (TOE) BASE FRACTURE;  Surgeon: Toni Arthurs, MD;  Location: [facility];  Service: Orthopedics;  Laterality: Left;   VULVECTOMY N/A 01/07/2016   Procedure: WIDE LOCAL EXCISION;  Surgeon: De Blanch, MD;  Location: [facility];  Service: Gynecology;  Laterality: N/A;  mons pubis as site   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, with necrosis of bone (HCC)    Diabetes mellitus type 2, uncontrolled, with complications 06/26/2021   Diabetic nephropathy (HCC) 06/26/2021   CKD (chronic kidney disease), stage III (HCC) 06/26/2021   HTN (hypertension) 06/24/2021   HIV (human immunodeficiency virus infection) (HCC) 06/24/2021   DM2 (diabetes mellitus, type 2) (HCC) 06/24/2021   HLD (hyperlipidemia) 06/24/2021   Osteomyelitis of left foot (HCC) 06/24/2021   Elevated LFTs 06/24/2021   Acute-on-chronic kidney injury (HCC) 06/24/2021   Osteomyelitis of foot (HCC) 04/04/2019   Hardware complicating wound infection (HCC)    Subacute osteomyelitis, left ankle and foot (HCC)    Open displaced fracture of fifth metatarsal bone of left foot    Healthcare maintenance 04/03/2019   HIV disease (HCC) 10/27/2015   DM type 2 (diabetes mellitus, type 2) (HCC) 10/27/2015   Hyperlipidemia 10/27/2015   Essential hypertension 10/27/2015   Severe vulvar dysplasia, histologically confirmed 10/15/2015    ONSET DATE: 12/17/2023  REFERRING DIAG: Z61.096 (ICD-10-CM) - S/P BKA (below knee amputation) unilateral, left (HCC)  THERAPY DIAG:  Other abnormalities of gait and  mobility  Muscle weakness (generalized)  Hx of BKA, left (HCC)  Chronic pain of right knee  Rationale for Evaluation and Treatment: Rehabilitation  SUBJECTIVE:   SUBJECTIVE STATEMENT: Patient is a 58 year old woman who is 6 months status post left below-knee amputation. Patient complains of some burning pain over the lateral aspect of the residual limb. Received her prosthetic leg mid to late October 2024. Pt had L ankle surgery in 07/2017 with Dr. Toni Arthurs. Pt was reporting her foot was turning in after her surgery. Pt reports swelling never went down. Dr. Lajoyce Corners revised surgery in late 2019. The plates were removed in 2020 and then he put new plates in there after infection was cleared in 2023. But screws were keep coming out and ultimately they decided to do amputation in July 2024. Pt reports ligament and tendon rupture in patella in 2008  and reports of knee pain with standing and walking.   Pt accompanied by: self  PERTINENT HISTORY: HIV, PMH includes: arthritis, asthma, Bell's palsy, CKD II, DM II, HIV, HTN, PVD, blind in L eye, and multiple previous L ankle surgeries, prior R TKA  PAIN:  Are you having pain? Yes: NPRS scale: 7/10 in residual leg; 10/10 in R knee with transfers Pain location: Lateral residual leg, pt also reports of phantom pain at night that wakes her up. Pain description: neuropathic (burning), phantom Aggravating factors: none Relieving factors: Tramadol  PRECAUTIONS: Fall and Other: HIV  RED FLAGS: None   WEIGHT BEARING RESTRICTIONS: No  FALLS: Has patient fallen in last 6 months? No  LIVING ENVIRONMENT: Lives with: lives with their family and both sons, daughter in Social worker, grandkids Lives in: House/apartment Home Access: Stairs to enter and Ramped entrance Home layout: Two level Stairs:  has stairs but also has ramp entry. Has following equipment at home: Dan Humphreys - 4 wheeled and shower chair  PLOF: Requires assistive device for independence and Needs  assistance with gait  PATIENT GOALS: to be able to walk better (pt hasn't walked well since 2018)  OBJECTIVE:  Note: Objective measures were completed at Evaluation unless otherwise noted.  COGNITION: Overall cognitive status: Within functional limits for tasks assessed   LOWER EXTREMITY ROM:  Active ROM Right eval Left eval  Hip flexion    Hip extension    Hip abduction    Hip adduction    Hip internal rotation    Hip external rotation    Knee flexion    Knee extension    Ankle dorsiflexion    Ankle plantarflexion    Ankle inversion    Ankle eversion     (Blank rows = not tested)  LOWER EXTREMITY MMT:  MMT Right eval Left eval  Hip flexion    Hip extension    Hip abduction    Hip adduction    Hip internal rotation    Hip external rotation    Knee flexion    Knee extension    Ankle dorsiflexion    Ankle plantarflexion    Ankle inversion    Ankle eversion    (Blank rows = not tested)  TRANSFERS: Sit to stand: Total A Stand to sit: Total A Lateral scoot transfers: SBA  GAIT: UNABLE  FUNCTIONAL TESTS: Unable to perform any functional tests as patient is non ambulatory at this time    CURRENT PROSTHETIC WEAR ASSESSMENT: Patient is independent with: skin check, residual limb care, prosthetic cleaning, and ply sock cleaning Patient is dependent with: residual limb care, correct ply sock adjustment, proper wear schedule/adjustment, and proper weight-bearing schedule/adjustment Donning prosthesis: SBA Doffing prosthesis: SBA Prosthetic wear tolerance: 1 hours/day, 7 days/week Prosthetic weight bearing tolerance: 0 minutes Edema: present Residual limb condition: dry scabs present, well healed   PATIENT SURVEYS:  LEFS 8/80  TREATMENT DATE:  Attempted max A x 1 transfer with 2 WW but patient unable to stand up from wheelchair due  to weakness and pain in R (non amputated) knee. Donned/doffed prosthesis with SBA Pt provided ply sock adjustment education, cleaning and care for prosthetic leg and residual leg Donned R knee brace, needed to adjust the brace as patient hadn't worn it in long time as patient is unable to WB on R LE due to pain and weakness.   Pt educated on increasing wear time by adding 1 hour every week  PATIENT EDUCATION: Education details: see above Person educated: Patient Education method: Medical illustrator Education comprehension: verbalized understanding  HOME EXERCISE PROGRAM: TBD  ASSESSMENT:  CLINICAL IMPRESSION: Patient is a 58 y.o. female who was seen today for physical therapy evaluation and treatment for L BKA. Patient also has significant history of R knee pain with hx of patella tendon rupture and lack of active knee extension in R knee. Pt has specialized knee brace to support her but patient is currently unable to stand with it due to pain and weakness in R LE. Functional tests and measures were not performed as patient is currently non ambulatory and lateral scoot transfers only. Patient reports of significant functional disability based on Lower Extremity Functional Test score of 8/80. Patient will benefit from skilled PT to improve transfers and functional mobility.  OBJECTIVE IMPAIRMENTS: Abnormal gait, decreased activity tolerance, decreased balance, decreased endurance, decreased mobility, difficulty walking, decreased strength, hypomobility, increased edema, increased muscle spasms, impaired flexibility, improper body mechanics, postural dysfunction, prosthetic dependency , and pain.   ACTIVITY LIMITATIONS: carrying, lifting, bending, standing, squatting, stairs, transfers, and locomotion level  PARTICIPATION LIMITATIONS: meal prep, cleaning, laundry, driving, shopping, community activity, and yard work  PERSONAL FACTORS: Time since onset of  injury/illness/exacerbation are also affecting patient's functional outcome.   REHAB POTENTIAL: Good  CLINICAL DECISION MAKING: Stable/uncomplicated  EVALUATION COMPLEXITY: Low   GOALS: Goals reviewed with patient? Yes  SHORT TERM GOALS: Target date: 02/07/2024     Patient will tolerate wearing prosthetic for 4 hours/day without discomfort or skin irritation Baseline: 1hour (1/23) Goal status: INITIAL  2.  Patient will demonstrate proper donning and doffing of the prosthetic independently without any assistance. Baseline: SBA, needs cueing (1/23) Goal status: INITIAL  3.  Patient will be be able to WB on prosthetic for total of 5 min/day to improve functional use. Baseline: unable to stand due to pain in R LE (non amputated Extremity) (1/23) Goal status: INITIAL  4.  Patient will be able to perform stand to sit with max A x 1 with use of RW and be able to perform stand pivot transfers.  Baseline: lateral scoot from chair to bed (SBA) (1/23) Goal status: INITIAL   LONG TERM GOALS: Target date: 03/06/2024   Patient will be able to ambulate 20 feet with RW and CGA to improve functional ambulation Baseline: hasn't ambulated since 2018 (1/23) Goal status: INITIAL  2.  Pt will be able to perform stand transer with walker with SBA only Baseline: lateral scoot with SBA (1/23) Goal status: INITIAL  3.  Patient will demo 15% improvement on LEFS to improve functional ability Baseline: 8/80 (1/23) Goal status: INITIAL  4.  Patient will be able to stand for at least 15 min per day in total with use of RW or appropriate AD to improve functional WB Baseline: unable to stand (1/23) Goal status: INITIAL    PLAN:  PT FREQUENCY: 2x/week  PT  DURATION: 8 weeks  PLANNED INTERVENTIONS: 97164- PT Re-evaluation, 97110-Therapeutic exercises, 97530- Therapeutic activity, 97112- Neuromuscular re-education, [phone]- Self Care, [id number]- Manual therapy, [phone]- Gait training, [phone]-  Prosthetic training, Patient/Family education, Balance training, Stair training, Joint mobilization, Scar mobilization, DME instructions, Wheelchair mobility training, Cryotherapy, and Moist heat  PLAN FOR NEXT SESSION: Pt is +2 asssist currently due to patella tendon rupture in R Knee, attempt sit to stand in parallel bar with max A x 2; OR standing frame from elevated bed; measure R KNEE ROM   Ileana Ladd, PT 01/10/2024, 12:06 PM

## 2024-01-14 DIAGNOSIS — N1831 Chronic kidney disease, stage 3a: Secondary | ICD-10-CM | POA: Diagnosis not present

## 2024-01-15 ENCOUNTER — Ambulatory Visit: Payer: 59 | Admitting: Physical Therapy

## 2024-01-15 DIAGNOSIS — M6281 Muscle weakness (generalized): Secondary | ICD-10-CM | POA: Diagnosis not present

## 2024-01-15 DIAGNOSIS — R2681 Unsteadiness on feet: Secondary | ICD-10-CM

## 2024-01-15 DIAGNOSIS — G8929 Other chronic pain: Secondary | ICD-10-CM | POA: Diagnosis not present

## 2024-01-15 DIAGNOSIS — M25561 Pain in right knee: Secondary | ICD-10-CM | POA: Diagnosis not present

## 2024-01-15 DIAGNOSIS — R2689 Other abnormalities of gait and mobility: Secondary | ICD-10-CM | POA: Diagnosis not present

## 2024-01-15 DIAGNOSIS — Z89512 Acquired absence of left leg below knee: Secondary | ICD-10-CM | POA: Diagnosis not present

## 2024-01-15 NOTE — Therapy (Signed)
OUTPATIENT PHYSICAL THERAPY PROSTHETICS TREATMENT   Patient Name: Katrina Wolfe MRN: [id number] DOB:[birth year], 58 y.o., female Today's Date: 01/15/2024  PCP: Dr. Tyson Dense REFERRING PROVIDER: Nadara Mustard, MD  END OF SESSION:  PT End of Session - 01/15/24 0932     Visit Number 2    Number of Visits 13    Date for PT Re-Evaluation 03/06/24    Authorization Type [employer] Medicare/ [place] Medicaid    PT Start Time [phone]    PT Stop Time 1005   Pt unable to tolerate full session   PT Time Calculation (min) 34 min    Equipment Utilized During Treatment Gait belt;Other (comment)   L BKA socket   Activity Tolerance Patient limited by pain    Behavior During Therapy [facility] for tasks assessed/performed              Past Medical History:  Diagnosis Date   Abnormal uterine bleeding (AUB)    Arthritis    knees, fingers   Asthma    as child, no problem since 1995   Bell's palsy 2004   CKD (chronic kidney disease), stage II    Complication of anesthesia    Congenital cardiomegaly    per pt has always been told since childhood and siblings    Diabetes mellitus without complication (HCC)    Dysrhythmia    Occasionally skipped beats   Elevated liver function tests    GERD (gastroesophageal reflux disease)    History of genital warts    HIV (human immunodeficiency virus infection) (HCC)    DX 1998--  MONITORED BY INFECTIOUS DISEASE-- DR Judyann Munson   Hypercholesterolemia    Hypertension    Intermittent palpitations    mild-- no meds   Legally blind in left eye, as defined in [place]    trauma as child   Peripheral vascular disease (HCC)    Pneumonia    PONV (postoperative nausea and vomiting)    Renal disorder    Tuberculosis    ? 2005 or 2007   Type 2 diabetes mellitus (HCC)    Type II    Uterine fibroid    VIN III (vulvar intraepithelial neoplasia III)    and Verrucoid lesion on the mons   Wears glasses    Past Surgical History:  Procedure Laterality Date    AMPUTATION Left 06/27/2023   Procedure: LEFT BELOW KNEE AMPUTATION;  Surgeon: Nadara Mustard, MD;  Location: [facility] OR;  Service: Orthopedics;  Laterality: Left;   ANKLE FUSION Left 02/21/2023   Procedure: REVISION LEFT ANKLE FUSION;  Surgeon: Nadara Mustard, MD;  Location: [facility] OR;  Service: Orthopedics;  Laterality: Left;   CESAREAN SECTION  2001  &  1984   CO2 LASER APPLICATION N/A 11/26/2015   Procedure: CO2 LASER APPLICATION AND WIDE VOCAL EXCSION OF LESION ON MONS PUBIS;  Surgeon: De Blanch, MD;  Location: [facility];  Service: Gynecology;  Laterality: N/A;   CASE CANCELLED    CO2 LASER APPLICATION N/A 01/07/2016   Procedure: CO2 LASER OF VULVAR;  Surgeon: De Blanch, MD;  Location: [facility];  Service: Gynecology;  Laterality: N/A;   CORNEAL TRANSPLANT Left 12/18/2004   failed   D & C HYSTEROSCOPY /  RESECTION FIBROID  12/18/2002   DILATATION & CURETTAGE/HYSTEROSCOPY WITH MYOSURE N/A 10/01/2019   Procedure: DILATATION & CURETTAGE/HYSTEROSCOPY WITH MYOSURE and HYDROTHERMAL ABLATION;  Surgeon: Geryl Rankins, MD;  Location: [facility];  Service: Gynecology;  Laterality: N/A;  HTA rep will be here. Confirmed on 09/25/19 CS   EYE SURGERY     FOOT ARTHRODESIS Left 11/30/2021   Procedure: LEFT TIBIOCALCANEAL FUSION;  Surgeon: Nadara Mustard, MD;  Location: [facility] OR;  Service: Orthopedics;  Laterality: Left;   HARDWARE REMOVAL Left 04/04/2019   Procedure: EXCISION BONE AND REMOVE DEEP HARDWARE LEFT 5TH METATARSAL;  Surgeon: Nadara Mustard, MD;  Location: [facility] OR;  Service: Orthopedics;  Laterality: Left;   HARDWARE REMOVAL Left 08/04/2022   Procedure: REMOVAL DEEP HARDWARE LEFT ANKLE;  Surgeon: Nadara Mustard, MD;  Location: [facility] OR;  Service: Orthopedics;  Laterality: Left;   I & D EXTREMITY Left 06/27/2021   Procedure: IRRIGATION AND DEBRIDEMENT FOOT ULCER;  Surgeon: Kerrin Champagne, MD;  Location: [facility] OR;  Service: Orthopedics;   Laterality: Left;   KNEE ARTHROSCOPY W/ ACL RECONSTRUCTION Bilateral 12/18/2006   ORIF TOE FRACTURE Left 08/02/2017   Procedure: OPEN REDUCTION INTERNAL FIXATION (ORIF) FIFTH METATARSAL (TOE) BASE FRACTURE;  Surgeon: Toni Arthurs, MD;  Location:  SURGERY CENTER;  Service: Orthopedics;  Laterality: Left;   VULVECTOMY N/A 01/07/2016   Procedure: WIDE LOCAL EXCISION;  Surgeon: De Blanch, MD;  Location: [facility];  Service: Gynecology;  Laterality: N/A;  mons pubis as site   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, with necrosis of bone (HCC)    Diabetes mellitus type 2, uncontrolled, with complications 06/26/2021   Diabetic nephropathy (HCC) 06/26/2021   CKD (chronic kidney disease), stage III (HCC) 06/26/2021   HTN (hypertension) 06/24/2021   HIV (human immunodeficiency virus infection) (HCC) 06/24/2021   DM2 (diabetes mellitus, type 2) (HCC) 06/24/2021   HLD (hyperlipidemia) 06/24/2021   Osteomyelitis of left foot (HCC) 06/24/2021   Elevated LFTs 06/24/2021   Acute-on-chronic kidney injury (HCC) 06/24/2021   Osteomyelitis of foot (HCC) 04/04/2019   Hardware complicating wound infection (HCC)    Subacute osteomyelitis, left ankle and foot (HCC)    Open displaced fracture of fifth metatarsal bone of left foot    Healthcare maintenance 04/03/2019   HIV disease (HCC) 10/27/2015   DM type 2 (diabetes mellitus, type 2) (HCC) 10/27/2015   Hyperlipidemia 10/27/2015   Essential hypertension 10/27/2015   Severe vulvar dysplasia, histologically confirmed 10/15/2015    ONSET DATE: 12/17/2023  REFERRING DIAG: Z61.096 (ICD-10-CM) - S/P BKA (below knee amputation) unilateral,  left (HCC)  THERAPY DIAG:  Other abnormalities of gait and mobility  Muscle weakness (generalized)  Chronic pain of right knee  Unsteadiness on feet  Rationale for Evaluation and Treatment: Rehabilitation  SUBJECTIVE:   SUBJECTIVE STATEMENT: Patient presents in WC, LLE in bag on her chair. Pt reports significant pain today but no falls or acute changes. Reports she is now wearing her LLE 2 hours per day   Pt accompanied by: self  PERTINENT HISTORY: HIV, PMH includes: arthritis, asthma, Bell's palsy, CKD II, DM II, HIV, HTN, PVD, blind in L eye, and multiple previous L ankle surgeries, prior R TKA  PAIN:  Are you having pain? Yes: NPRS scale: 7/10 in R leg; 5/10 in L knee  Pain location: Lateral residual leg, pt also reports of phantom pain at night that wakes her up. Pain description: neuropathic (burning),  phantom Aggravating factors: none Relieving factors: Tramadol  PRECAUTIONS: Fall and Other: HIV  RED FLAGS: None   WEIGHT BEARING RESTRICTIONS: No  FALLS: Has patient fallen in last 6 months? No  LIVING ENVIRONMENT: Lives with: lives with their family and both sons, daughter in Social worker, grandkids Lives in: House/apartment Home Access: Stairs to enter and Ramped entrance Home layout: Two level Stairs:  has stairs but also has ramp entry. Has following equipment at home: Dan Humphreys - 4 wheeled and shower chair  PLOF: Requires assistive device for independence and Needs assistance with gait  PATIENT GOALS: to be able to walk better (pt hasn't walked well since 2018)  OBJECTIVE:  Note: Objective measures were completed at Evaluation unless otherwise noted.  COGNITION: Overall cognitive status: Within functional limits for tasks assessed   LOWER EXTREMITY ROM:  Active ROM Right eval Left eval  Hip flexion    Hip extension    Hip abduction    Hip adduction    Hip internal rotation    Hip external rotation    Knee flexion    Knee extension    Ankle  dorsiflexion    Ankle plantarflexion    Ankle inversion    Ankle eversion     (Blank rows = not tested)  LOWER EXTREMITY MMT:  MMT Right eval Left eval  Hip flexion    Hip extension    Hip abduction    Hip adduction    Hip internal rotation    Hip external rotation    Knee flexion    Knee extension    Ankle dorsiflexion    Ankle plantarflexion    Ankle inversion    Ankle eversion    (Blank rows = not tested)  TRANSFERS: Sit to stand: Total A Stand to sit: Total A Lateral scoot transfers: SBA  GAIT: UNABLE  FUNCTIONAL TESTS: Unable to perform any functional tests as patient is non ambulatory at this time    CURRENT PROSTHETIC WEAR ASSESSMENT: Patient is independent with: skin check, residual limb care, prosthetic cleaning, and ply sock cleaning Patient is dependent with: residual limb care, correct ply sock adjustment, proper wear schedule/adjustment, and proper weight-bearing schedule/adjustment Donning prosthesis: SBA Doffing prosthesis: SBA Prosthetic wear tolerance: 1 hours/day, 7 days/week Prosthetic weight bearing tolerance: 0 minutes Edema: present Residual limb condition: dry scabs present, well healed   PATIENT SURVEYS:  LEFS 8/80                                                                                                                              TREATMENT DATE:   Ther Act  Measured P/ROM of R knee extension in seated position: -30 degrees  Pt lacking full knee extension of L knee, but did not formally measure  Pt performed lateral scoot transfer to L side from WC to mat table mod I. Pt then donned L BKA prosthetic mod I, no socks applied this date.  Elevated mat to 25" tall  and pt performed sit to stand transfer w/min A x2 and RW and was able to hold stand for ~20s. Pt assumes forward flexed posture and is unable to fully extend BLEs, L knee more flexed due to height of prosthesis. On second stand, placed 2" step under RLE and donned Icarus  brace to R knee w/max A to assist w/R knee extension. Pt performed second stand w/min A x2 and held for ~10s. Pt reporting 8/10 pain in R knee and unable to attempt to stand again.  Supervisor adjusted pt's brakes on WC during session so that chair will lock.  Pt doffed prosthetic mod I and performed lateral scoot transfer from lowered mat table to WC mod I to L side.  Therapist to contact prosthetist, Thayer Ohm, to request L prosthetic be lowered to assist w/transfers and reduce weightbearing on RLE.  Pt rated pain in R knee as 8/10 at end of session.    PATIENT EDUCATION: Education details: Continue wearing leg 2 hours per day, next appointment day and time, plan to contact prosthetist  Person educated: Patient Education method: Medical illustrator Education comprehension: verbalized understanding  HOME EXERCISE PROGRAM: TBD  ASSESSMENT:  CLINICAL IMPRESSION: Emphasis of skilled PT session on assessing transfers and ROM of bilateral knees. Pt lacking 30 degrees of extension w/P/ROM on  RLE and cannot obtain full knee extension on L, but did not formally measure. Pt most limited by R knee pain and L BKA prosthetic being too long. Pt was able to perform sit to stand from 25" mat table w/min A x2 but could not hold stand for >10s due to severe R knee pain, even w/Icarus brace on. Will contact prosthetist and inquire about L prosthetic being shortened. Continue POC.    OBJECTIVE IMPAIRMENTS: Abnormal gait, decreased activity tolerance, decreased balance, decreased endurance, decreased mobility, difficulty walking, decreased strength, hypomobility, increased edema, increased muscle spasms, impaired flexibility, improper body mechanics, postural dysfunction, prosthetic dependency , and pain.   ACTIVITY LIMITATIONS: carrying, lifting, bending, standing, squatting, stairs, transfers, and locomotion level  PARTICIPATION LIMITATIONS: meal prep, cleaning, laundry, driving, shopping, community  activity, and yard work  PERSONAL FACTORS: Time since onset of injury/illness/exacerbation are also affecting patient's functional outcome.   REHAB POTENTIAL: Good  CLINICAL DECISION MAKING: Stable/uncomplicated  EVALUATION COMPLEXITY: Low   GOALS: Goals reviewed with patient? Yes  SHORT TERM GOALS: Target date: 02/07/2024     Patient will tolerate wearing prosthetic for 4 hours/day without discomfort or skin irritation Baseline: 1hour (1/23) Goal status: INITIAL  2.  Patient will demonstrate proper donning and doffing of the prosthetic independently without any assistance. Baseline: SBA, needs cueing (1/23) Goal status: INITIAL  3.  Patient will be be able to WB on prosthetic for total of 5 min/day to improve functional use. Baseline: unable to stand due to pain in R LE (non amputated Extremity) (1/23) Goal status: INITIAL  4.  Patient will be able to perform stand to sit with max A x 1 with use of RW and be able to perform stand pivot transfers.  Baseline: lateral scoot from chair to bed (SBA) (1/23) Goal status: INITIAL   LONG TERM GOALS: Target date: 03/06/2024   Patient will be able to ambulate 20 feet with RW and CGA to improve functional ambulation Baseline: hasn't ambulated since 2018 (1/23) Goal status: INITIAL  2.  Pt will be able to perform stand transer with walker with SBA only Baseline: lateral scoot with SBA (1/23) Goal status: INITIAL  3.  Patient  will demo 15% improvement on LEFS to improve functional ability Baseline: 8/80 (1/23) Goal status: INITIAL  4.  Patient will be able to stand for at least 15 min per day in total with use of RW or appropriate AD to improve functional WB Baseline: unable to stand (1/23) Goal status: INITIAL    PLAN:  PT FREQUENCY: 2x/week  PT DURATION: 8 weeks  PLANNED INTERVENTIONS: [id number]- PT Re-evaluation, 97110-Therapeutic exercises, 97530- Therapeutic activity, 97112- Neuromuscular re-education, 97535- Self  Care, [id number]- Manual therapy, [phone]- Gait training, [phone]- Prosthetic training, Patient/Family education, Balance training, Stair training, Joint mobilization, Scar mobilization, DME instructions, Wheelchair mobility training, Cryotherapy, and Moist heat  PLAN FOR NEXT SESSION: Pt is +2 asssist currently due to patella tendon rupture in R Knee, attempt sit to stand in parallel bar with max A x 2; OR standing frame from elevated bed;   Establish quad dominant HEP in sitting and supine. Did Alethia Berthold hear from [place]?    Jill Alexanders Jillene Wehrenberg, PT, DPT 01/15/2024, 10:09 AM

## 2024-01-17 ENCOUNTER — Ambulatory Visit: Payer: 59 | Admitting: Physical Therapy

## 2024-01-17 DIAGNOSIS — R2681 Unsteadiness on feet: Secondary | ICD-10-CM

## 2024-01-17 DIAGNOSIS — R2689 Other abnormalities of gait and mobility: Secondary | ICD-10-CM | POA: Diagnosis not present

## 2024-01-17 DIAGNOSIS — G8929 Other chronic pain: Secondary | ICD-10-CM | POA: Diagnosis not present

## 2024-01-17 DIAGNOSIS — M6281 Muscle weakness (generalized): Secondary | ICD-10-CM

## 2024-01-17 DIAGNOSIS — Z89512 Acquired absence of left leg below knee: Secondary | ICD-10-CM | POA: Diagnosis not present

## 2024-01-17 DIAGNOSIS — M25561 Pain in right knee: Secondary | ICD-10-CM | POA: Diagnosis not present

## 2024-01-17 NOTE — Therapy (Signed)
OUTPATIENT PHYSICAL THERAPY PROSTHETICS TREATMENT   Patient Name: Katrina Wolfe MRN: [id number] DOB:10/13/1966, 58 y.o., female Today's Date: 01/17/2024  PCP: Dr. Tyson Dense REFERRING PROVIDER: Nadara Mustard, MD  END OF SESSION:  PT End of Session - 01/17/24 0936     Visit Number 3    Number of Visits 13    Date for PT Re-Evaluation 03/06/24    Authorization Type [employer] Medicare/ [place] [employer]    PT Start Time [phone]    PT Stop Time 1012    PT Time Calculation (min) 38 min    Equipment Utilized During Treatment Gait belt;Other (comment)   L BKA socket   Activity Tolerance Patient tolerated treatment well    Behavior During Therapy WFL for tasks assessed/performed              Past Medical History:  Diagnosis Date   Abnormal uterine bleeding (AUB)    Arthritis    knees, fingers   Asthma    as child, no problem since 1995   Bell's palsy 2004   CKD (chronic kidney disease), stage II    Complication of anesthesia    Congenital cardiomegaly    per pt has always been told since childhood and siblings    Diabetes mellitus without complication (HCC)    Dysrhythmia    Occasionally skipped beats   Elevated liver function tests    GERD (gastroesophageal reflux disease)    History of genital warts    HIV (human immunodeficiency virus infection) (HCC)    DX 1998--  MONITORED BY INFECTIOUS DISEASE-- DR Judyann Munson   Hypercholesterolemia    Hypertension    Intermittent palpitations    mild-- no meds   Legally blind in left eye, as defined in [place]    trauma as child   Peripheral vascular disease (HCC)    Pneumonia    PONV (postoperative nausea and vomiting)    Renal disorder    Tuberculosis    ? 2005 or 2007   Type 2 diabetes mellitus (HCC)    Type II    Uterine fibroid    VIN III (vulvar intraepithelial neoplasia III)    and Verrucoid lesion on the mons   Wears glasses    Past Surgical History:  Procedure Laterality Date   AMPUTATION Left 06/27/2023    Procedure: LEFT BELOW KNEE AMPUTATION;  Surgeon: Nadara Mustard, MD;  Location: [facility] OR;  Service: Orthopedics;  Laterality: Left;   ANKLE FUSION Left 02/21/2023   Procedure: REVISION LEFT ANKLE FUSION;  Surgeon: Nadara Mustard, MD;  Location: [facility] OR;  Service: Orthopedics;  Laterality: Left;   CESAREAN SECTION  2001  &  1984   CO2 LASER APPLICATION N/A 11/26/2015   Procedure: CO2 LASER APPLICATION AND WIDE VOCAL EXCSION OF LESION ON MONS PUBIS;  Surgeon: De Blanch, MD;  Location: [facility];  Service: Gynecology;  Laterality: N/A;   CASE CANCELLED    CO2 LASER APPLICATION N/A 01/07/2016   Procedure: CO2 LASER OF VULVAR;  Surgeon: De Blanch, MD;  [facility];  Service: Gynecology;  Laterality: N/A;   CORNEAL TRANSPLANT Left 12/18/2004   failed   D & C HYSTEROSCOPY /  RESECTION FIBROID  12/18/2002   DILATATION & CURETTAGE/HYSTEROSCOPY WITH MYOSURE N/A 10/01/2019   Procedure: DILATATION & CURETTAGE/HYSTEROSCOPY WITH MYOSURE and HYDROTHERMAL ABLATION;  Surgeon: Geryl Rankins, MD;  Location: [facility];  Service: Gynecology;  Laterality: N/A;  HTA rep will be here. Confirmed  on 09/25/19 CS   EYE SURGERY     FOOT ARTHRODESIS Left 11/30/2021   Procedure: LEFT TIBIOCALCANEAL FUSION;  Surgeon: Nadara Mustard, MD;  Location: [facility] OR;  Service: Orthopedics;  Laterality: Left;   HARDWARE REMOVAL Left 04/04/2019   Procedure: EXCISION BONE AND REMOVE DEEP HARDWARE LEFT 5TH METATARSAL;  Surgeon: Nadara Mustard, MD;  Location: [facility] OR;  Service: Orthopedics;  Laterality: Left;   HARDWARE REMOVAL Left 08/04/2022   Procedure: REMOVAL DEEP HARDWARE LEFT ANKLE;  Surgeon: Nadara Mustard, MD;  Location: [facility] OR;  Service: Orthopedics;  Laterality: Left;   I & D EXTREMITY Left 06/27/2021   Procedure: IRRIGATION AND DEBRIDEMENT FOOT ULCER;  Surgeon: Kerrin Champagne, MD;  Location: [facility] OR;  Service: Orthopedics;  Laterality: Left;   KNEE ARTHROSCOPY  W/ ACL RECONSTRUCTION Bilateral 12/18/2006   ORIF TOE FRACTURE Left 08/02/2017   Procedure: OPEN REDUCTION INTERNAL FIXATION (ORIF) FIFTH METATARSAL (TOE) BASE FRACTURE;  Surgeon: Toni Arthurs, MD;  Location: [facility];  Service: Orthopedics;  Laterality: Left;   VULVECTOMY N/A 01/07/2016   Procedure: WIDE LOCAL EXCISION;  Surgeon: De Blanch, MD;  Location: [facility];  Service: Gynecology;  Laterality: N/A;  mons pubis as site   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, with necrosis of bone (HCC)    Diabetes mellitus type 2, uncontrolled, with complications 06/26/2021   Diabetic nephropathy (HCC) 06/26/2021   CKD (chronic kidney disease), stage III (HCC) 06/26/2021   HTN (hypertension) 06/24/2021   HIV (human immunodeficiency virus infection) (HCC) 06/24/2021   DM2 (diabetes mellitus, type 2) (HCC) 06/24/2021   HLD (hyperlipidemia) 06/24/2021   Osteomyelitis of left foot (HCC) 06/24/2021   Elevated LFTs 06/24/2021   Acute-on-chronic kidney injury (HCC) 06/24/2021   Osteomyelitis of foot (HCC) 04/04/2019   Hardware complicating wound infection (HCC)    Subacute osteomyelitis, left ankle and foot (HCC)    Open displaced fracture of fifth metatarsal bone of left foot    Healthcare maintenance 04/03/2019   HIV disease (HCC) 10/27/2015   DM type 2 (diabetes mellitus, type 2) (HCC) 10/27/2015   Hyperlipidemia 10/27/2015   Essential hypertension 10/27/2015   Severe vulvar dysplasia, histologically confirmed 10/15/2015    ONSET DATE: 12/17/2023  REFERRING DIAG: Z61.096 (ICD-10-CM) - S/P BKA (below knee amputation) unilateral, left (HCC)  THERAPY DIAG:  Other  abnormalities of gait and mobility  Muscle weakness (generalized)  Unsteadiness on feet  Chronic pain of right knee  Rationale for Evaluation and Treatment: Rehabilitation  SUBJECTIVE:   SUBJECTIVE STATEMENT: Patient presents in WC, LLE in bag on her chair. Pt reports she had severe pain following last session so took pain meds this morning. Pain is at a 4/10 today.   Pt accompanied by: self  PERTINENT HISTORY: HIV, PMH includes: arthritis, asthma, Bell's palsy, CKD II, DM II, HIV, HTN, PVD, blind in L eye, and multiple previous L ankle surgeries, prior R TKA  PAIN:  Are you having pain? Yes: NPRS scale: 4/10 in R leg; 4/10 in L knee  Pain location: Lateral residual leg, pt also reports of phantom pain at night that wakes her up. Pain description: neuropathic (burning), phantom Aggravating factors: none Relieving factors: Tramadol  PRECAUTIONS: Fall and Other: HIV  RED FLAGS: None   WEIGHT BEARING RESTRICTIONS: No  FALLS: Has patient fallen in last 6 months? No  LIVING ENVIRONMENT: Lives with: lives with their family and both sons, daughter in Social worker, grandkids Lives in: House/apartment Home Access: Stairs to enter and Ramped entrance Home layout: Two level Stairs:  has stairs but also has ramp entry. Has following equipment at home: Dan Humphreys - 4 wheeled and shower chair  PLOF: Requires assistive device for independence and Needs assistance with gait  PATIENT GOALS: to be able to walk better (pt hasn't walked well since 2018)  OBJECTIVE:  Note: Objective measures were completed at Evaluation unless otherwise noted.  COGNITION: Overall cognitive status: Within functional limits for tasks assessed   LOWER EXTREMITY ROM:  Active ROM Right eval Left eval  Hip flexion    Hip extension    Hip abduction    Hip adduction    Hip internal rotation    Hip external rotation    Knee flexion    Knee extension    Ankle dorsiflexion    Ankle plantarflexion    Ankle  inversion    Ankle eversion     (Blank rows = not tested)  LOWER EXTREMITY MMT:  MMT Right eval Left eval  Hip flexion    Hip extension    Hip abduction    Hip adduction    Hip internal rotation    Hip external rotation    Knee flexion    Knee extension    Ankle dorsiflexion    Ankle plantarflexion    Ankle inversion    Ankle eversion    (Blank rows = not tested)  TRANSFERS: Sit to stand: Total A Stand to sit: Total A Lateral scoot transfers: SBA  GAIT: UNABLE  FUNCTIONAL TESTS: Unable to perform any functional tests as patient is non ambulatory at this time    CURRENT PROSTHETIC WEAR ASSESSMENT: Patient is independent with: skin check, residual limb care, prosthetic cleaning, and ply sock cleaning Patient is dependent with: residual limb care, correct ply sock adjustment, proper wear schedule/adjustment, and proper weight-bearing schedule/adjustment Donning prosthesis: SBA Doffing prosthesis: SBA Prosthetic wear tolerance: 1 hours/day, 7 days/week Prosthetic weight bearing tolerance: 0 minutes Edema: present Residual limb condition: dry scabs present, well healed   PATIENT SURVEYS:  LEFS 8/80                                                                                                                            TREATMENT DATE:   Ther Ex Pt performed lateral scoot transfer from WC to mat table on L side mod I (LLE not donned). The following exercises were performed for improved quad/hip strength, knee ROM and posterior chain strength:  Seated LAQ, x10 per side. Very minimal quad activation on R side. Added to HEP (See bolded below)  Supine quad set, x10 reps per side. Required bolster underneath RLE as pt unable to tolerate full knee  extension, so did not add to HEP.  Sidelying clams, x15 per side. Added to HEP (see bolded below)  Single leg glute bridge, x10 reps. Min cues to maintain breathing throughout. Added to HEP (see bolded below)  Pt donned LLE  prosthesis mod I but provided education on proper sock management.  Pt performed lateral scoot transfer from mat to [facility] on R side mod I.  SciFit multi-peaks level 5 for 8 minutes using BUE/BLEs for dynamic cardiovascular warmup, improved knee ROM and increased amplitude of stepping. RPE of 3/10 and pt reported 3/10 pain following activity    PATIENT EDUCATION: Education details: Initial HEP, information on prosthetist shortening leg  Person educated: Patient Education method: Explanation, Demonstration, Verbal cues, and Handouts Education comprehension: verbalized understanding, returned demonstration, and needs further education  HOME EXERCISE PROGRAM: Access Code: [email or web address] URL: [email or web address] Date: 01/17/2024 Prepared by: Alethia Berthold Ximenna Fonseca  Exercises - Single Leg Bridge  - 1 x daily - 7 x weekly - 3 sets - 10 reps - Clamshell  - 1 x daily - 7 x weekly - 3 sets - 10 reps - Seated knee extension   - 1 x daily - 7 x weekly - 3 sets - 10 reps  ASSESSMENT:  CLINICAL IMPRESSION: Emphasis of skilled PT session on improved quad strength, knee extension ROM and posterior chain strength. Established initial HEP to assist w/transfers. Pt w/minimal quad activation in RLE and could not tolerate supine position without bolster due to knee pain. Pt did perform well on SciFit and was able to achieve significantly more knee extension w/movement. Pt reports she has not heard from [facility] about shortening leg, but prosthetist out of town. Continue POC.    OBJECTIVE IMPAIRMENTS: Abnormal gait, decreased activity tolerance, decreased balance, decreased endurance, decreased mobility, difficulty walking, decreased strength, hypomobility, increased edema, increased muscle spasms, impaired flexibility, improper body mechanics, postural dysfunction, prosthetic dependency , and pain.   ACTIVITY LIMITATIONS: carrying, lifting, bending, standing, squatting, stairs, transfers, and locomotion  level  PARTICIPATION LIMITATIONS: meal prep, cleaning, laundry, driving, shopping, community activity, and yard work  PERSONAL FACTORS: Time since onset of injury/illness/exacerbation are also affecting patient's functional outcome.   REHAB POTENTIAL: Good  CLINICAL DECISION MAKING: Stable/uncomplicated  EVALUATION COMPLEXITY: Low   GOALS: Goals reviewed with patient? Yes  SHORT TERM GOALS: Target date: 02/07/2024     Patient will tolerate wearing prosthetic for 4 hours/day without discomfort or skin irritation Baseline: 1hour (1/23) Goal status: INITIAL  2.  Patient will demonstrate proper donning and doffing of the prosthetic independently without any assistance. Baseline: SBA, needs cueing (1/23) Goal status: INITIAL  3.  Patient will be be able to WB on prosthetic for total of 5 min/day to improve functional use. Baseline: unable to stand due to pain in R LE (non amputated Extremity) (1/23) Goal status: INITIAL  4.  Patient will be able to perform stand to sit with max A x 1 with use of RW and be able to perform stand pivot transfers.  Baseline: lateral scoot from chair to bed (SBA) (1/23) Goal status: INITIAL   LONG TERM GOALS: Target date: 03/06/2024   Patient will be able to ambulate 20 feet with RW and CGA to improve functional ambulation Baseline: hasn't ambulated since 2018 (1/23) Goal status: INITIAL  2.  Pt will be able to perform stand transer with walker with SBA only Baseline: lateral scoot with SBA (1/23) Goal status: INITIAL  3.  Patient will demo 15% improvement on LEFS to improve functional ability  Baseline: 8/80 (1/23) Goal status: INITIAL  4.  Patient will be able to stand for at least 15 min per day in total with use of RW or appropriate AD to improve functional WB Baseline: unable to stand (1/23) Goal status: INITIAL    PLAN:  PT FREQUENCY: 2x/week  PT DURATION: 8 weeks  PLANNED INTERVENTIONS: 97164- PT Re-evaluation,  97110-Therapeutic exercises, 97530- Therapeutic activity, 97112- Neuromuscular re-education, 97535- Self Care, 16109- Manual therapy, [phone]- Gait training, [phone]- Prosthetic training, Patient/Family education, Balance training, Stair training, Joint mobilization, Scar mobilization, DME instructions, Wheelchair mobility training, Cryotherapy, and Moist heat  PLAN FOR NEXT SESSION: Pt is +2 asssist currently due to patella tendon rupture in bilateral knees. Start w/scifit for improved knee extension ROM. Try stands from elevated mat (25"). Stedy (to allow more knee flexion)?   Did pt hear from [place]?    Jill Alexanders Kimothy Kishimoto, PT, DPT 01/17/2024, 10:15 AM

## 2024-01-21 DIAGNOSIS — I129 Hypertensive chronic kidney disease with stage 1 through stage 4 chronic kidney disease, or unspecified chronic kidney disease: Secondary | ICD-10-CM | POA: Diagnosis not present

## 2024-01-21 DIAGNOSIS — E1122 Type 2 diabetes mellitus with diabetic chronic kidney disease: Secondary | ICD-10-CM | POA: Diagnosis not present

## 2024-01-21 DIAGNOSIS — E785 Hyperlipidemia, unspecified: Secondary | ICD-10-CM | POA: Diagnosis not present

## 2024-01-21 DIAGNOSIS — N1831 Chronic kidney disease, stage 3a: Secondary | ICD-10-CM | POA: Diagnosis not present

## 2024-01-22 ENCOUNTER — Encounter: Payer: Self-pay | Admitting: Physical Therapy

## 2024-01-22 ENCOUNTER — Encounter: Payer: 59 | Admitting: Physical Therapy

## 2024-01-22 ENCOUNTER — Ambulatory Visit: Payer: 59 | Attending: Orthopedic Surgery | Admitting: Physical Therapy

## 2024-01-22 DIAGNOSIS — R2689 Other abnormalities of gait and mobility: Secondary | ICD-10-CM | POA: Diagnosis not present

## 2024-01-22 DIAGNOSIS — M6281 Muscle weakness (generalized): Secondary | ICD-10-CM | POA: Diagnosis not present

## 2024-01-22 DIAGNOSIS — M25551 Pain in right hip: Secondary | ICD-10-CM | POA: Diagnosis not present

## 2024-01-22 DIAGNOSIS — R2681 Unsteadiness on feet: Secondary | ICD-10-CM | POA: Diagnosis not present

## 2024-01-22 DIAGNOSIS — M25561 Pain in right knee: Secondary | ICD-10-CM | POA: Diagnosis not present

## 2024-01-22 DIAGNOSIS — G8929 Other chronic pain: Secondary | ICD-10-CM | POA: Insufficient documentation

## 2024-01-22 NOTE — Therapy (Signed)
 OUTPATIENT PHYSICAL THERAPY PROSTHETICS TREATMENT   Patient Name: Katrina Wolfe MRN: [id number] DOB:1966-01-14, 58 y.o., female Today's Date: 01/22/2024  PCP: Dr. Ardell Seen REFERRING PROVIDER: Harden Jerona GAILS, MD  END OF SESSION:  PT End of Session - 01/22/24 1513     Visit Number 4    Number of Visits 13    Date for PT Re-Evaluation 03/06/24    Authorization Type [employer] Medicare/ [employer]    PT Start Time 1509   pt agreeable to being seen early   PT Stop Time 1549    PT Time Calculation (min) 40 min    Equipment Utilized During Treatment Gait belt;Other (comment)   L BKA prosthetic   Activity Tolerance Patient tolerated treatment well;Patient limited by pain    Behavior During Therapy WFL for tasks assessed/performed              Past Medical History:  Diagnosis Date   Abnormal uterine bleeding (AUB)    Arthritis    knees, fingers   Asthma    as child, no problem since 1995   Bell's palsy 2004   CKD (chronic kidney disease), stage II    Complication of anesthesia    Congenital cardiomegaly    per pt has always been told since childhood and siblings    Diabetes mellitus without complication (HCC)    Dysrhythmia    Occasionally skipped beats   Elevated liver function tests    GERD (gastroesophageal reflux disease)    History of genital warts    HIV (human immunodeficiency virus infection) (HCC)    DX 1998--  MONITORED BY INFECTIOUS DISEASE-- DR MONTIE BOLOGNA   Hypercholesterolemia    Hypertension    Intermittent palpitations    mild-- no meds   Legally blind in left eye, as defined in [place]     trauma as child   Peripheral vascular disease (HCC)    Pneumonia    PONV (postoperative nausea and vomiting)    Renal disorder    Tuberculosis    ? 2005 or 2007   Type 2 diabetes mellitus (HCC)    Type II    Uterine fibroid    VIN III (vulvar intraepithelial neoplasia III)    and Verrucoid lesion on the mons   Wears glasses    Past Surgical  History:  Procedure Laterality Date   AMPUTATION Left 06/27/2023   Procedure: LEFT BELOW KNEE AMPUTATION;  Surgeon: Harden Jerona GAILS, MD;  Location: [facility] OR;  Service: Orthopedics;  Laterality: Left;   ANKLE FUSION Left 02/21/2023   Procedure: REVISION LEFT ANKLE FUSION;  Surgeon: Harden Jerona GAILS, MD;  Location: [facility] OR;  Service: Orthopedics;  Laterality: Left;   CESAREAN SECTION  2001  &  1984   CO2 LASER APPLICATION N/A 11/26/2015   Procedure: CO2 LASER APPLICATION AND WIDE VOCAL EXCSION OF LESION ON MONS PUBIS;  Surgeon: Toribio Percy, MD;  Location: [facility];  Service: Gynecology;  Laterality: N/A;   CASE CANCELLED    CO2 LASER APPLICATION N/A 01/07/2016   Procedure: CO2 LASER OF VULVAR;  Surgeon: Toribio Percy, MD;  Location: [facility];  Service: Gynecology;  Laterality: N/A;   CORNEAL TRANSPLANT Left 12/18/2004   failed   D & C HYSTEROSCOPY /  RESECTION FIBROID  12/18/2002   DILATATION & CURETTAGE/HYSTEROSCOPY WITH MYOSURE N/A 10/01/2019   Procedure: DILATATION & CURETTAGE/HYSTEROSCOPY WITH MYOSURE and HYDROTHERMAL ABLATION;  Surgeon: Timmie Norris, MD;  Location: [facility];  Service: Gynecology;  Laterality: N/A;  HTA rep will be here. Confirmed on 09/25/19 CS   EYE SURGERY     FOOT ARTHRODESIS Left 11/30/2021   Procedure: LEFT TIBIOCALCANEAL FUSION;  Surgeon: Harden Jerona GAILS, MD;  Location: [facility] OR;  Service: Orthopedics;  Laterality: Left;   HARDWARE REMOVAL Left 04/04/2019   Procedure: EXCISION BONE AND REMOVE DEEP HARDWARE LEFT 5TH METATARSAL;  Surgeon: Harden Jerona GAILS, MD;  Location: [facility] OR;  Service: Orthopedics;  Laterality: Left;   HARDWARE REMOVAL Left 08/04/2022   Procedure: REMOVAL DEEP HARDWARE LEFT ANKLE;  Surgeon: Harden Jerona GAILS, MD;  Location: [facility] OR;  Service: Orthopedics;  Laterality: Left;   I & D EXTREMITY Left 06/27/2021   Procedure: IRRIGATION AND DEBRIDEMENT FOOT ULCER;  Surgeon: Lucilla Lynwood BRAVO, MD;  Location:  [facility] OR;  Service: Orthopedics;  Laterality: Left;   KNEE ARTHROSCOPY W/ ACL RECONSTRUCTION Bilateral 12/18/2006   ORIF TOE FRACTURE Left 08/02/2017   Procedure: OPEN REDUCTION INTERNAL FIXATION (ORIF) FIFTH METATARSAL (TOE) BASE FRACTURE;  Surgeon: Kit Rush, MD;  Location: [facility];  Service: Orthopedics;  Laterality: Left;   VULVECTOMY N/A 01/07/2016   Procedure: WIDE LOCAL EXCISION;  Surgeon: Toribio Percy, MD;  Location: [facility];  Service: Gynecology;  Laterality: N/A;  mons pubis as site   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, with necrosis of bone (HCC)    Diabetes mellitus type 2, uncontrolled, with complications 06/26/2021   Diabetic nephropathy (HCC) 06/26/2021   CKD (chronic kidney disease), stage III (HCC) 06/26/2021   HTN (hypertension) 06/24/2021   HIV (human immunodeficiency virus infection) (HCC) 06/24/2021   DM2 (diabetes mellitus, type 2) (HCC) 06/24/2021   HLD (hyperlipidemia) 06/24/2021   Osteomyelitis of left foot (HCC) 06/24/2021   Elevated LFTs 06/24/2021   Acute-on-chronic kidney injury (HCC) 06/24/2021   Osteomyelitis of foot (HCC) 04/04/2019   Hardware complicating wound infection (HCC)    Subacute osteomyelitis, left ankle and foot (HCC)    Open displaced fracture of fifth metatarsal bone of left foot    Healthcare maintenance 04/03/2019   HIV disease (HCC) 10/27/2015   DM type 2 (diabetes mellitus, type 2) (HCC) 10/27/2015   Hyperlipidemia 10/27/2015   Essential hypertension 10/27/2015   Severe vulvar dysplasia, histologically confirmed 10/15/2015    ONSET DATE: 12/17/2023  REFERRING DIAG: S10.487 (ICD-10-CM) - S/P BKA (below  knee amputation) unilateral, left (HCC)  THERAPY DIAG:  Other abnormalities of gait and mobility  Muscle weakness (generalized)  Unsteadiness on feet  Chronic pain of right knee  Rationale for Evaluation and Treatment: Rehabilitation  SUBJECTIVE:   SUBJECTIVE STATEMENT: Patient presents in WC, LLE in bag on her chair.  Pain is at a 4/10 today in residual limb.  She denies recent falls or acute changes.  She has not heard from [place] but has an appt on 2/18.  Pt accompanied by: self  PERTINENT HISTORY: HIV, PMH includes: arthritis, asthma, Bell's palsy, CKD II, DM II, HIV, HTN, PVD, blind in L eye, and multiple previous L ankle surgeries, prior R TKA  PAIN:  Are you having pain? Yes: NPRS scale: 4/10 in R leg; 4/10 in L knee  Pain location: Lateral residual leg, pt also reports of phantom  pain at night that wakes her up. Pain description: neuropathic (burning), phantom Aggravating factors: none Relieving factors: Tramadol   PRECAUTIONS: Fall and Other: HIV  RED FLAGS: None   WEIGHT BEARING RESTRICTIONS: No  FALLS: Has patient fallen in last 6 months? No  LIVING ENVIRONMENT: Lives with: lives with their family and both sons, daughter in social worker, grandkids Lives in: House/apartment Home Access: Stairs to enter and Ramped entrance Home layout: Two level Stairs:  has stairs but also has ramp entry. Has following equipment at home: Vannie - 4 wheeled and shower chair  PLOF: Requires assistive device for independence and Needs assistance with gait  PATIENT GOALS: to be able to walk better (pt hasn't walked well since 2018)  OBJECTIVE:  Note: Objective measures were completed at Evaluation unless otherwise noted.  COGNITION: Overall cognitive status: Within functional limits for tasks assessed   LOWER EXTREMITY ROM:  Active ROM Right eval Left eval  Hip flexion    Hip extension    Hip abduction    Hip adduction    Hip internal rotation    Hip external rotation     Knee flexion    Knee extension    Ankle dorsiflexion    Ankle plantarflexion    Ankle inversion    Ankle eversion     (Blank rows = not tested)  LOWER EXTREMITY MMT:  MMT Right eval Left eval  Hip flexion    Hip extension    Hip abduction    Hip adduction    Hip internal rotation    Hip external rotation    Knee flexion    Knee extension    Ankle dorsiflexion    Ankle plantarflexion    Ankle inversion    Ankle eversion    (Blank rows = not tested)  TRANSFERS: Sit to stand: Total A Stand to sit: Total A Lateral scoot transfers: SBA  GAIT: UNABLE  FUNCTIONAL TESTS: Unable to perform any functional tests as patient is non ambulatory at this time    CURRENT PROSTHETIC WEAR ASSESSMENT: Patient is independent with: skin check, residual limb care, prosthetic cleaning, and ply sock cleaning Patient is dependent with: residual limb care, correct ply sock adjustment, proper wear schedule/adjustment, and proper weight-bearing schedule/adjustment Donning prosthesis: SBA Doffing prosthesis: SBA Prosthetic wear tolerance: 1 hours/day, 7 days/week Prosthetic weight bearing tolerance: 0 minutes Edema: present Residual limb condition: dry scabs present, well healed   PATIENT SURVEYS:  LEFS 8/80                                                                                                                            TREATMENT DATE:  Self-Care: Time spent watching pt don prosthetic independently and using socks to adjust fit.  She does well with task only requiring single cue to remove sock due to limb volume and noted socket fit when pushing limb down. Discussed possible need for sock following activity and she may note increased clicking into socket as  fluid leaves residual limb. Discuss shortening prosthetic w/ Bobby at upcoming appt - wrote down as pt worried she will forget.  TherAct: +2 unavailable at start of session as scheduled therapist went home and no other  assist available at time. SciFit x8 minutes in multi-peaks mode progressing to level 5.0 using BUE/BLE for LE edema management, quad strengthening, and activity tolerance.  Had pt don knee brace prior to modI lateral scoot transfer to left from W/C > mat table. SBA using pull-to-stand method to Stedy from 67 cm elevated mat surface x1, increased height by 1cm and repeated stand x2, pt unable to hold stand so immediately goes back into sitting on mat, she prefers to adjust hands to pushing on side bars during upright vs forward orientation, she needs prolonged seated rest b/w due to fatigue and right knee pain that temporarily increases in weight-bearing, she attempts to shift some weight to left during last stand due to pain Returned to w/c mod I lateral left scoot  PATIENT EDUCATION: Education details:   See above.  Continue HEP. Person educated: Patient Education method: Explanation, Demonstration, Verbal cues, and Handouts Education comprehension: verbalized understanding, returned demonstration, and needs further education  HOME EXERCISE PROGRAM: Access Code: [email or web address] URL: [email or web address] Date: 01/17/2024 Prepared by: Marlon Plaster  Exercises - Single Leg Bridge  - 1 x daily - 7 x weekly - 3 sets - 10 reps - Clamshell  - 1 x daily - 7 x weekly - 3 sets - 10 reps - Seated knee extension   - 1 x daily - 7 x weekly - 3 sets - 10 reps  ASSESSMENT:  CLINICAL IMPRESSION: PT continued to address quad engagement and standing tolerance this visit.  Increased height of mat table to 68cm w/ pt able to pull-to-stand SBA x3.  She experienced increased right knee pain and required immediate sit on each rep, but tolerated standing better today than session prior and required less assist.  Will continue to address standing tolerance and decreased need for assist with ongoing POC.   OBJECTIVE IMPAIRMENTS: Abnormal gait, decreased activity tolerance, decreased balance, decreased  endurance, decreased mobility, difficulty walking, decreased strength, hypomobility, increased edema, increased muscle spasms, impaired flexibility, improper body mechanics, postural dysfunction, prosthetic dependency , and pain.   ACTIVITY LIMITATIONS: carrying, lifting, bending, standing, squatting, stairs, transfers, and locomotion level  PARTICIPATION LIMITATIONS: meal prep, cleaning, laundry, driving, shopping, community activity, and yard work  PERSONAL FACTORS: Time since onset of injury/illness/exacerbation are also affecting patient's functional outcome.   REHAB POTENTIAL: Good  CLINICAL DECISION MAKING: Stable/uncomplicated  EVALUATION COMPLEXITY: Low   GOALS: Goals reviewed with patient? Yes  SHORT TERM GOALS: Target date: 02/07/2024     Patient will tolerate wearing prosthetic for 4 hours/day without discomfort or skin irritation Baseline: 1hour (1/23) Goal status: INITIAL  2.  Patient will demonstrate proper donning and doffing of the prosthetic independently without any assistance. Baseline: SBA, needs cueing (1/23) Goal status: INITIAL  3.  Patient will be be able to WB on prosthetic for total of 5 min/day to improve functional use. Baseline: unable to stand due to pain in R LE (non amputated Extremity) (1/23) Goal status: INITIAL  4.  Patient will be able to perform stand to sit with max A x 1 with use of RW and be able to perform stand pivot transfers.  Baseline: lateral scoot from chair to bed (SBA) (1/23) Goal status: INITIAL   LONG TERM GOALS: Target date: 03/06/2024   Patient will be able  to ambulate 20 feet with RW and CGA to improve functional ambulation Baseline: hasn't ambulated since 2018 (1/23) Goal status: INITIAL  2.  Pt will be able to perform stand transer with walker with SBA only Baseline: lateral scoot with SBA (1/23) Goal status: INITIAL  3.  Patient will demo 15% improvement on LEFS to improve functional ability Baseline: 8/80  (1/23) Goal status: INITIAL  4.  Patient will be able to stand for at least 15 min per day in total with use of RW or appropriate AD to improve functional WB Baseline: unable to stand (1/23) Goal status: INITIAL    PLAN:  PT FREQUENCY: 2x/week  PT DURATION: 8 weeks  PLANNED INTERVENTIONS: 97164- PT Re-evaluation, 97110-Therapeutic exercises, 97530- Therapeutic activity, 97112- Neuromuscular re-education, 97535- Self Care, [id number]- Manual therapy, [phone]- Gait training, [phone]- Prosthetic training, Patient/Family education, Balance training, Stair training, Joint mobilization, Scar mobilization, DME instructions, Wheelchair mobility training, Cryotherapy, and Moist heat  PLAN FOR NEXT SESSION: Pt is +2 asssist currently due to patella tendon rupture in bilateral knees. Start w/scifit for improved knee extension ROM. Try stands from elevated mat (25). Stedy (to allow more knee flexion) - prolonged standing holds!  (Did well from 68cm height w/o +2)  Did pt hear from [place]? Appt 2/18   Daved KATHEE Bull, PT, DPT 01/22/2024, 4:12 PM

## 2024-01-24 ENCOUNTER — Ambulatory Visit: Payer: 59 | Admitting: Physical Therapy

## 2024-01-24 DIAGNOSIS — M25561 Pain in right knee: Secondary | ICD-10-CM | POA: Diagnosis not present

## 2024-01-24 DIAGNOSIS — R2689 Other abnormalities of gait and mobility: Secondary | ICD-10-CM | POA: Diagnosis not present

## 2024-01-24 DIAGNOSIS — M6281 Muscle weakness (generalized): Secondary | ICD-10-CM

## 2024-01-24 DIAGNOSIS — G8929 Other chronic pain: Secondary | ICD-10-CM | POA: Diagnosis not present

## 2024-01-24 DIAGNOSIS — R2681 Unsteadiness on feet: Secondary | ICD-10-CM | POA: Diagnosis not present

## 2024-01-24 DIAGNOSIS — M25551 Pain in right hip: Secondary | ICD-10-CM | POA: Diagnosis not present

## 2024-01-24 NOTE — Therapy (Addendum)
 OUTPATIENT PHYSICAL THERAPY PROSTHETICS TREATMENT   Patient Name: Katrina Wolfe MRN: [id number] DOB:Sep 25, 1966, 58 y.o., female Today's Date: 01/24/2024  PCP: Dr. Ardell Seen REFERRING PROVIDER: Harden Jerona GAILS, MD  END OF SESSION:  PT End of Session - 01/24/24 1321     Visit Number 5    Number of Visits 13    Date for PT Re-Evaluation 03/06/24    Authorization Type [employer] Medicare/ [employer]    PT Start Time 1318    PT Stop Time 1353   Unable to tolerate full session due to pain   PT Time Calculation (min) 35 min    Equipment Utilized During Treatment Gait belt;Other (comment)   L BKA prosthetic   Activity Tolerance Patient limited by pain;Patient tolerated treatment well    Behavior During Therapy [facility] for tasks assessed/performed               Past Medical History:  Diagnosis Date   Abnormal uterine bleeding (AUB)    Arthritis    knees, fingers   Asthma    as child, no problem since 1995   Bell's palsy 2004   CKD (chronic kidney disease), stage II    Complication of anesthesia    Congenital cardiomegaly    per pt has always been told since childhood and siblings    Diabetes mellitus without complication (HCC)    Dysrhythmia    Occasionally skipped beats   Elevated liver function tests    GERD (gastroesophageal reflux disease)    History of genital warts    HIV (human immunodeficiency virus infection) (HCC)    DX 1998--  MONITORED BY INFECTIOUS DISEASE-- DR MONTIE BOLOGNA   Hypercholesterolemia    Hypertension    Intermittent palpitations    mild-- no meds   Legally blind in left eye, as defined in [place]     trauma as child   Peripheral vascular disease (HCC)    Pneumonia    PONV (postoperative nausea and vomiting)    Renal disorder    Tuberculosis    ? 2005 or 2007   Type 2 diabetes mellitus (HCC)    Type II    Uterine fibroid    VIN III (vulvar intraepithelial neoplasia III)    and Verrucoid lesion on the mons   Wears glasses    Past  Surgical History:  Procedure Laterality Date   AMPUTATION Left 06/27/2023   Procedure: LEFT BELOW KNEE AMPUTATION;  Surgeon: Harden Jerona GAILS, MD;  Location: [facility] OR;  Service: Orthopedics;  Laterality: Left;   ANKLE FUSION Left 02/21/2023   Procedure: REVISION LEFT ANKLE FUSION;  Surgeon: Harden Jerona GAILS, MD;  Location: [facility] OR;  Service: Orthopedics;  Laterality: Left;   CESAREAN SECTION  2001  &  1984   CO2 LASER APPLICATION N/A 11/26/2015   Procedure: CO2 LASER APPLICATION AND WIDE VOCAL EXCSION OF LESION ON MONS PUBIS;  Surgeon: Toribio Percy, MD;  Location: [facility];  Service: Gynecology;  Laterality: N/A;   CASE CANCELLED    CO2 LASER APPLICATION N/A 01/07/2016   Procedure: CO2 LASER OF VULVAR;  Surgeon: Toribio Percy, MD;  Location: [facility];  Service: Gynecology;  Laterality: N/A;   CORNEAL TRANSPLANT Left 12/18/2004   failed   D & C HYSTEROSCOPY /  RESECTION FIBROID  12/18/2002   DILATATION & CURETTAGE/HYSTEROSCOPY WITH MYOSURE N/A 10/01/2019   Procedure: DILATATION & CURETTAGE/HYSTEROSCOPY WITH MYOSURE and HYDROTHERMAL ABLATION;  Surgeon: Timmie Norris, MD;  Location: [facility];  Service: Gynecology;  Laterality: N/A;  HTA rep will be here. Confirmed on 09/25/19 CS   EYE SURGERY     FOOT ARTHRODESIS Left 11/30/2021   Procedure: LEFT TIBIOCALCANEAL FUSION;  Surgeon: Harden Jerona GAILS, MD;  Location: [facility] OR;  Service: Orthopedics;  Laterality: Left;   HARDWARE REMOVAL Left 04/04/2019   Procedure: EXCISION BONE AND REMOVE DEEP HARDWARE LEFT 5TH METATARSAL;  Surgeon: Harden Jerona GAILS, MD;  Location: [facility] OR;  Service: Orthopedics;  Laterality: Left;   HARDWARE REMOVAL Left 08/04/2022   Procedure: REMOVAL DEEP HARDWARE LEFT ANKLE;  Surgeon: Harden Jerona GAILS, MD;  Location: [facility] OR;  Service: Orthopedics;  Laterality: Left;   I & D EXTREMITY Left 06/27/2021   Procedure: IRRIGATION AND DEBRIDEMENT FOOT ULCER;  Surgeon: Lucilla Lynwood BRAVO, MD;   Location: [facility] OR;  Service: Orthopedics;  Laterality: Left;   KNEE ARTHROSCOPY W/ ACL RECONSTRUCTION Bilateral 12/18/2006   ORIF TOE FRACTURE Left 08/02/2017   Procedure: OPEN REDUCTION INTERNAL FIXATION (ORIF) FIFTH METATARSAL (TOE) BASE FRACTURE;  Surgeon: Kit Rush, MD;  Location: [facility];  Service: Orthopedics;  Laterality: Left;   VULVECTOMY N/A 01/07/2016   Procedure: WIDE LOCAL EXCISION;  Surgeon: Toribio Percy, MD;  Location: [facility];  Service: Gynecology;  Laterality: N/A;  mons pubis as site   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, with necrosis of bone (HCC)    Diabetes mellitus type 2, uncontrolled, with complications 06/26/2021   Diabetic nephropathy (HCC) 06/26/2021   CKD (chronic kidney disease), stage III (HCC) 06/26/2021   HTN (hypertension) 06/24/2021   HIV (human immunodeficiency virus infection) (HCC) 06/24/2021   DM2 (diabetes mellitus, type 2) (HCC) 06/24/2021   HLD (hyperlipidemia) 06/24/2021   Osteomyelitis of left foot (HCC) 06/24/2021   Elevated LFTs 06/24/2021   Acute-on-chronic kidney injury (HCC) 06/24/2021   Osteomyelitis of foot (HCC) 04/04/2019   Hardware complicating wound infection (HCC)    Subacute osteomyelitis, left ankle and foot (HCC)    Open displaced fracture of fifth metatarsal bone of left foot    Healthcare maintenance 04/03/2019   HIV disease (HCC) 10/27/2015   DM type 2 (diabetes mellitus, type 2) (HCC) 10/27/2015   Hyperlipidemia 10/27/2015   Essential hypertension 10/27/2015   Severe vulvar dysplasia, histologically confirmed 10/15/2015    ONSET DATE: 12/17/2023  REFERRING DIAG: S10.487 (ICD-10-CM) - S/P  BKA (below knee amputation) unilateral, left (HCC)  THERAPY DIAG:  Other abnormalities of gait and mobility  Muscle weakness (generalized)  Unsteadiness on feet  Chronic pain of right knee  Rationale for Evaluation and Treatment: Rehabilitation  SUBJECTIVE:   SUBJECTIVE STATEMENT: Patient presents in WC, LLE in bag on her chair.  Pain is at a 3/10 today in residual limb.  She denies recent falls or acute changes. Took pain meds prior to session.   Pt accompanied by: self  PERTINENT HISTORY: HIV, PMH includes: arthritis, asthma, Bell's palsy, CKD II, DM II, HIV, HTN, PVD, blind in L eye, and multiple previous L ankle surgeries, prior R TKA  PAIN:  Are you having pain? Yes: NPRS scale: 3/10 in R leg; 3/10 in L knee  Pain location: Lateral residual leg, pt also reports of phantom pain at night  that wakes her up. Pain description: neuropathic (burning), phantom Aggravating factors: none Relieving factors: Tramadol   PRECAUTIONS: Fall and Other: HIV  RED FLAGS: None   WEIGHT BEARING RESTRICTIONS: No  FALLS: Has patient fallen in last 6 months? No  LIVING ENVIRONMENT: Lives with: lives with their family and both sons, daughter in social worker, grandkids Lives in: House/apartment Home Access: Stairs to enter and Ramped entrance Home layout: Two level Stairs:  has stairs but also has ramp entry. Has following equipment at home: Vannie - 4 wheeled and shower chair  PLOF: Requires assistive device for independence and Needs assistance with gait  PATIENT GOALS: to be able to walk better (pt hasn't walked well since 2018)  OBJECTIVE:  Note: Objective measures were completed at Evaluation unless otherwise noted.  COGNITION: Overall cognitive status: Within functional limits for tasks assessed   LOWER EXTREMITY ROM:  Active ROM Right eval Left eval  Hip flexion    Hip extension    Hip abduction    Hip adduction    Hip internal rotation    Hip external rotation    Knee  flexion    Knee extension    Ankle dorsiflexion    Ankle plantarflexion    Ankle inversion    Ankle eversion     (Blank rows = not tested)  LOWER EXTREMITY MMT:  MMT Right eval Left eval  Hip flexion    Hip extension    Hip abduction    Hip adduction    Hip internal rotation    Hip external rotation    Knee flexion    Knee extension    Ankle dorsiflexion    Ankle plantarflexion    Ankle inversion    Ankle eversion    (Blank rows = not tested)  TRANSFERS: Sit to stand: Total A Stand to sit: Total A Lateral scoot transfers: SBA  GAIT: UNABLE  FUNCTIONAL TESTS: Unable to perform any functional tests as patient is non ambulatory at this time    CURRENT PROSTHETIC WEAR ASSESSMENT: Patient is independent with: skin check, residual limb care, prosthetic cleaning, and ply sock cleaning Patient is dependent with: residual limb care, correct ply sock adjustment, proper wear schedule/adjustment, and proper weight-bearing schedule/adjustment Donning prosthesis: SBA Doffing prosthesis: SBA Prosthetic wear tolerance: 1 hours/day, 7 days/week Prosthetic weight bearing tolerance: 0 minutes Edema: present Residual limb condition: dry scabs present, well healed   PATIENT SURVEYS:  LEFS 8/80                                                                                                                            TREATMENT DATE:  Self-Care: Time spent watching pt don prosthetic independently and using socks to adjust fit. Pt independent w/this today and no socks required.    TherAct: SciFit x8 minutes in multi-peaks mode progressing to level 2.5 using BUE/BLE for LE edema management, quad strengthening, improved knee extension ROM and activity tolerance. Pt able to achieve almost 0 degrees  of knee extension w/movement on R side.  Pt performed lateral transfer from [facility] to mat table mod I to L side. Donned Icarus brace to RLE w/max A.  Elevated height of mat to 62cm and placed  2 step under RLE in Stedy to accommodate for leg length discrepancy and offload some weight from RLE.  Sit to stand in stedy x4 reps for improved BLE strength, weightbearing tolerance and functional mobility. On first rep, pt able to hold for ~60s w/min A x2 and max multimodal cues for glute activation and upright posture. Noted trembling of RLE throughout as well as significant weight shift to L side. Placed seat of Stedy down and had pt perform remaining 3 reps from stedy seat rather than mat w/CGA for improved glute extension activation. Pt able to hold stand for ~15s on reps 2 and 3, unable to hold on rep 4 due to R knee pain. Pt rated knee pain as 5/10 w/significant trembling of RLE noted, so ended session.  Pt doffed L prosthetic mod I at edge of mat and performed lateral scoot transfer from lowered mat to WC on R side mod I w/o LLE donned. Pt removed Icarus brace mod I as well.   PATIENT EDUCATION: Education details:   See above.  Continue HEP. Person educated: Patient Education method: Explanation, Demonstration, Verbal cues, and Handouts Education comprehension: verbalized understanding, returned demonstration, and needs further education  HOME EXERCISE PROGRAM: Access Code: [email or web address] URL: [email or web address] Date: 01/17/2024 Prepared by: Marlon Keileigh Vahey  Exercises - Single Leg Bridge  - 1 x daily - 7 x weekly - 3 sets - 10 reps - Clamshell  - 1 x daily - 7 x weekly - 3 sets - 10 reps - Seated knee extension   - 1 x daily - 7 x weekly - 3 sets - 10 reps  ASSESSMENT:  CLINICAL IMPRESSION: Emphasis of skilled PT session on weightbearing tolerance, transfers and improved A/ROM of R knee extension. Pt reporting lower levels of pain at baseline today, took pain meds prior to session. Noted almost neutral knee extension of RLE on SciFit today. Pt able to pull to stand from lower mat height (62 cm) w/min A x2 for facilitation of hip extension and was able to hold stand for ~1  minute prior to needing to sit due to R knee pain. Pt able to pull to stand x4 times today but did report increase in R knee pain and was unable to hold stand on final rep due to pain and weakness. Continue POC.   OBJECTIVE IMPAIRMENTS: Abnormal gait, decreased activity tolerance, decreased balance, decreased endurance, decreased mobility, difficulty walking, decreased strength, hypomobility, increased edema, increased muscle spasms, impaired flexibility, improper body mechanics, postural dysfunction, prosthetic dependency , and pain.   ACTIVITY LIMITATIONS: carrying, lifting, bending, standing, squatting, stairs, transfers, and locomotion level  PARTICIPATION LIMITATIONS: meal prep, cleaning, laundry, driving, shopping, community activity, and yard work  PERSONAL FACTORS: Time since onset of injury/illness/exacerbation are also affecting patient's functional outcome.   REHAB POTENTIAL: Good  CLINICAL DECISION MAKING: Stable/uncomplicated  EVALUATION COMPLEXITY: Low   GOALS: Goals reviewed with patient? Yes  SHORT TERM GOALS: Target date: 02/07/2024     Patient will tolerate wearing prosthetic for 4 hours/day without discomfort or skin irritation Baseline: 1hour (1/23) Goal status: INITIAL  2.  Patient will demonstrate proper donning and doffing of the prosthetic independently without any assistance. Baseline: SBA, needs cueing (1/23) Goal status: INITIAL  3.  Patient will be be able to [facility]  on prosthetic for total of 5 min/day to improve functional use. Baseline: unable to stand due to pain in R LE (non amputated Extremity) (1/23) Goal status: INITIAL  4.  Patient will be able to perform stand to sit with max A x 1 with use of RW and be able to perform stand pivot transfers.  Baseline: lateral scoot from chair to bed (SBA) (1/23) Goal status: INITIAL   LONG TERM GOALS: Target date: 03/06/2024   Patient will be able to ambulate 20 feet with RW and CGA to improve functional  ambulation Baseline: hasn't ambulated since 2018 (1/23) Goal status: INITIAL  2.  Pt will be able to perform stand transer with walker with SBA only Baseline: lateral scoot with SBA (1/23) Goal status: INITIAL  3.  Patient will demo 15% improvement on LEFS to improve functional ability Baseline: 8/80 (1/23) Goal status: INITIAL  4.  Patient will be able to stand for at least 15 min per day in total with use of RW or appropriate AD to improve functional WB Baseline: unable to stand (1/23) Goal status: INITIAL    PLAN:  PT FREQUENCY: 2x/week  PT DURATION: 8 weeks  PLANNED INTERVENTIONS: 97164- PT Re-evaluation, 97110-Therapeutic exercises, 97530- Therapeutic activity, 97112- Neuromuscular re-education, 97535- Self Care, [id number]- Manual therapy, [phone]- Gait training, [phone]- Prosthetic training, Patient/Family education, Balance training, Stair training, Joint mobilization, Scar mobilization, DME instructions, Wheelchair mobility training, Cryotherapy, and Moist heat  PLAN FOR NEXT SESSION: Stand pivot? Pt is +2 asssist currently due to patella tendon rupture in bilateral knees. Start w/scifit for improved knee extension ROM. Try stands from elevated mat (25). Stedy (to allow more knee flexion) - prolonged standing holds!    Did pt hear from [place]? Appt 2/18   Chanequa Spees E Michaelle Bottomley, PT, DPT 01/24/2024, 1:56 PM

## 2024-01-29 ENCOUNTER — Encounter: Payer: 59 | Admitting: Physical Therapy

## 2024-01-31 ENCOUNTER — Ambulatory Visit: Payer: 59 | Admitting: Physical Therapy

## 2024-01-31 DIAGNOSIS — R2681 Unsteadiness on feet: Secondary | ICD-10-CM | POA: Diagnosis not present

## 2024-01-31 DIAGNOSIS — R2689 Other abnormalities of gait and mobility: Secondary | ICD-10-CM

## 2024-01-31 DIAGNOSIS — M25551 Pain in right hip: Secondary | ICD-10-CM | POA: Diagnosis not present

## 2024-01-31 DIAGNOSIS — M6281 Muscle weakness (generalized): Secondary | ICD-10-CM | POA: Diagnosis not present

## 2024-01-31 DIAGNOSIS — G8929 Other chronic pain: Secondary | ICD-10-CM | POA: Diagnosis not present

## 2024-01-31 DIAGNOSIS — M25561 Pain in right knee: Secondary | ICD-10-CM | POA: Diagnosis not present

## 2024-01-31 NOTE — Therapy (Signed)
OUTPATIENT PHYSICAL THERAPY PROSTHETICS TREATMENT   Patient Name: Katrina Wolfe MRN: [id number] DOB:1966/02/26, 58 y.o., female Today's Date: 01/31/2024  PCP: Dr. Tyson Dense REFERRING PROVIDER: Nadara Mustard, MD  END OF SESSION:  PT End of Session - 01/31/24 1020     Visit Number 6    Number of Visits 13    Date for PT Re-Evaluation 03/06/24    Authorization Type [employer] Medicare/ [employer]    PT Start Time 1018    PT Stop Time 1056    PT Time Calculation (min) 38 min    Equipment Utilized During Treatment Gait belt;Other (comment)   L BKA prosthetic   Activity Tolerance Patient limited by pain;Patient tolerated treatment well    Behavior During Therapy [facility] for tasks assessed/performed               Past Medical History:  Diagnosis Date   Abnormal uterine bleeding (AUB)    Arthritis    knees, fingers   Asthma    as child, no problem since 1995   Bell's palsy 2004   CKD (chronic kidney disease), stage II    Complication of anesthesia    Congenital cardiomegaly    per pt has always been told since childhood and siblings    Diabetes mellitus without complication (HCC)    Dysrhythmia    Occasionally skipped beats   Elevated liver function tests    GERD (gastroesophageal reflux disease)    History of genital warts    HIV (human immunodeficiency virus infection) (HCC)    DX 1998--  MONITORED BY INFECTIOUS DISEASE-- DR Judyann Munson   Hypercholesterolemia    Hypertension    Intermittent palpitations    mild-- no meds   Legally blind in left eye, as defined in [place]    trauma as child   Peripheral vascular disease (HCC)    Pneumonia    PONV (postoperative nausea and vomiting)    Renal disorder    Tuberculosis    ? 2005 or 2007   Type 2 diabetes mellitus (HCC)    Type II    Uterine fibroid    VIN III (vulvar intraepithelial neoplasia III)    and Verrucoid lesion on the mons   Wears glasses    Past Surgical History:  Procedure Laterality Date    AMPUTATION Left 06/27/2023   Procedure: LEFT BELOW KNEE AMPUTATION;  Surgeon: Nadara Mustard, MD;  Location: [facility] OR;  Service: Orthopedics;  Laterality: Left;   ANKLE FUSION Left 02/21/2023   Procedure: REVISION LEFT ANKLE FUSION;  Surgeon: Nadara Mustard, MD;  Location: [facility] OR;  Service: Orthopedics;  Laterality: Left;   CESAREAN SECTION  2001  &  1984   CO2 LASER APPLICATION N/A 11/26/2015   Procedure: CO2 LASER APPLICATION AND WIDE VOCAL EXCSION OF LESION ON MONS PUBIS;  Surgeon: De Blanch, MD;  Location: [facility];  Service: Gynecology;  Laterality: N/A;   CASE CANCELLED    CO2 LASER APPLICATION N/A 01/07/2016   Procedure: CO2 LASER OF VULVAR;  Surgeon: De Blanch, MD;  Location: [facility];  Service: Gynecology;  Laterality: N/A;   CORNEAL TRANSPLANT Left 12/18/2004   failed   D & C HYSTEROSCOPY /  RESECTION FIBROID  12/18/2002   DILATATION & CURETTAGE/HYSTEROSCOPY WITH MYOSURE N/A 10/01/2019   Procedure: DILATATION & CURETTAGE/HYSTEROSCOPY WITH MYOSURE and HYDROTHERMAL ABLATION;  Surgeon: Geryl Rankins, MD;  Location: [facility];  Service: Gynecology;  Laterality: N/A;  HTA rep  will be here. Confirmed on 09/25/19 CS   EYE SURGERY     FOOT ARTHRODESIS Left 11/30/2021   Procedure: LEFT TIBIOCALCANEAL FUSION;  Surgeon: Nadara Mustard, MD;  Location: [facility] OR;  Service: Orthopedics;  Laterality: Left;   HARDWARE REMOVAL Left 04/04/2019   Procedure: EXCISION BONE AND REMOVE DEEP HARDWARE LEFT 5TH METATARSAL;  Surgeon: Nadara Mustard, MD;  Location: [facility] OR;  Service: Orthopedics;  Laterality: Left;   HARDWARE REMOVAL Left 08/04/2022   Procedure: REMOVAL DEEP HARDWARE LEFT ANKLE;  Surgeon: Nadara Mustard, MD;  Location: [facility] OR;  Service: Orthopedics;  Laterality: Left;   I & D EXTREMITY Left 06/27/2021   Procedure: IRRIGATION AND DEBRIDEMENT FOOT ULCER;  Surgeon: Kerrin Champagne, MD;  Location: [facility] OR;  Service: Orthopedics;   Laterality: Left;   KNEE ARTHROSCOPY W/ ACL RECONSTRUCTION Bilateral 12/18/2006   ORIF TOE FRACTURE Left 08/02/2017   Procedure: OPEN REDUCTION INTERNAL FIXATION (ORIF) FIFTH METATARSAL (TOE) BASE FRACTURE;  Surgeon: Toni Arthurs, MD;  Location: [facility];  Service: Orthopedics;  Laterality: Left;   VULVECTOMY N/A 01/07/2016   Procedure: WIDE LOCAL EXCISION;  Surgeon: De Blanch, MD;  Location: [facility];  Service: Gynecology;  Laterality: N/A;  mons pubis as site   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, with necrosis of bone (HCC)    Diabetes mellitus type 2, uncontrolled, with complications 06/26/2021   Diabetic nephropathy (HCC) 06/26/2021   CKD (chronic kidney disease), stage III (HCC) 06/26/2021   HTN (hypertension) 06/24/2021   HIV (human immunodeficiency virus infection) (HCC) 06/24/2021   DM2 (diabetes mellitus, type 2) (HCC) 06/24/2021   HLD (hyperlipidemia) 06/24/2021   Osteomyelitis of left foot (HCC) 06/24/2021   Elevated LFTs 06/24/2021   Acute-on-chronic kidney injury (HCC) 06/24/2021   Osteomyelitis of foot (HCC) 04/04/2019   Hardware complicating wound infection (HCC)    Subacute osteomyelitis, left ankle and foot (HCC)    Open displaced fracture of fifth metatarsal bone of left foot    Healthcare maintenance 04/03/2019   HIV disease (HCC) 10/27/2015   DM type 2 (diabetes mellitus, type 2) (HCC) 10/27/2015   Hyperlipidemia 10/27/2015   Essential hypertension 10/27/2015   Severe vulvar dysplasia, histologically confirmed 10/15/2015    ONSET DATE: 12/17/2023  REFERRING DIAG: N82.956 (ICD-10-CM) - S/P BKA (below knee amputation) unilateral,  left (HCC)  THERAPY DIAG:  Other abnormalities of gait and mobility  Muscle weakness (generalized)  Unsteadiness on feet  Rationale for Evaluation and Treatment: Rehabilitation  SUBJECTIVE:   SUBJECTIVE STATEMENT: Patient presents in WC, LLE in bag on her chair. Reports having a rough week, had the flu and has had a lot of pain in distal LLE. Pain is at a 6/10 today. HEP is going well   Pt accompanied by: self  PERTINENT HISTORY: HIV, PMH includes: arthritis, asthma, Bell's palsy, CKD II, DM II, HIV, HTN, PVD, blind in L eye, and multiple previous L ankle surgeries, prior R TKA  PAIN:  Are you having pain? Yes: NPRS scale: 3/10 in R leg; 3/10 in L knee  Pain location: Lateral residual leg, pt also reports of phantom pain at night that wakes her up. Pain description: neuropathic (burning), phantom Aggravating  factors: none Relieving factors: Tramadol  PRECAUTIONS: Fall and Other: HIV  RED FLAGS: None   WEIGHT BEARING RESTRICTIONS: No  FALLS: Has patient fallen in last 6 months? No  LIVING ENVIRONMENT: Lives with: lives with their family and both sons, daughter in Social worker, grandkids Lives in: House/apartment Home Access: Stairs to enter and Ramped entrance Home layout: Two level Stairs:  has stairs but also has ramp entry. Has following equipment at home: Dan Humphreys - 4 wheeled and shower chair  PLOF: Requires assistive device for independence and Needs assistance with gait  PATIENT GOALS: to be able to walk better (pt hasn't walked well since 2018)  OBJECTIVE:  Note: Objective measures were completed at Evaluation unless otherwise noted.  COGNITION: Overall cognitive status: Within functional limits for tasks assessed   LOWER EXTREMITY ROM:  Active ROM Right eval Left eval  Hip flexion    Hip extension    Hip abduction    Hip adduction    Hip internal rotation    Hip external rotation    Knee flexion    Knee extension    Ankle dorsiflexion    Ankle  plantarflexion    Ankle inversion    Ankle eversion     (Blank rows = not tested)  LOWER EXTREMITY MMT:  MMT Right eval Left eval  Hip flexion    Hip extension    Hip abduction    Hip adduction    Hip internal rotation    Hip external rotation    Knee flexion    Knee extension    Ankle dorsiflexion    Ankle plantarflexion    Ankle inversion    Ankle eversion    (Blank rows = not tested)  TRANSFERS: Sit to stand: Total A Stand to sit: Total A Lateral scoot transfers: SBA  GAIT: UNABLE  FUNCTIONAL TESTS: Unable to perform any functional tests as patient is non ambulatory at this time    CURRENT PROSTHETIC WEAR ASSESSMENT: Patient is independent with: skin check, residual limb care, prosthetic cleaning, and ply sock cleaning Patient is dependent with: residual limb care, correct ply sock adjustment, proper wear schedule/adjustment, and proper weight-bearing schedule/adjustment Donning prosthesis: SBA Doffing prosthesis: SBA Prosthetic wear tolerance: 1 hours/day, 7 days/week Prosthetic weight bearing tolerance: 0 minutes Edema: present Residual limb condition: dry scabs present, well healed   PATIENT SURVEYS:  LEFS 8/80                                                                                                                            TREATMENT DATE:  Self-Care: Time spent watching pt don prosthetic independently and using socks to adjust fit. Pt independent w/this today and donned 3-ply sock.  Discussed plan for therapist to call prosthetist, Reita Cliche, and request for him to come to pt's visit on 2/18 to adjust socket.    TherAct: SciFit multi-peaks level 4 for 10 minutes using BUE/BLEs for neural priming for reciprocal movement, dynamic cardiovascular warmup, knee  extension ROM and increased amplitude of stepping. Pt able to achieve almost 0 degrees of knee extension w/movement and reported decline in knee pain w/activity.   NMR  Pt performed lateral  transfer from WC to mat table mod I to L side.   Elevated height of mat to 60 cm and placed 2" step under RLE in Stedy to accommodate for leg length discrepancy and offload some weight from RLE.  Sit to stand in stedy x4 reps for improved BLE strength, weightbearing tolerance and functional mobility:  Rep 1: max A to stand, held for 10s, significant weight shift to L side. Max multimodal cues for upright posture and facilitation of glute extension throughout.  Rep 2: Max A, held for 15s  Rep 3: Max A, held for 30s  Rep 4: max A, held for 2 minutes w/max encouraging cues.  Pt doffed L prosthetic mod I at edge of mat and performed lateral scoot transfer from lowered mat to WC on R side mod I w/o LLE donned.     PATIENT EDUCATION: Education details:  Continue HEP. Person educated: Patient Education method: Explanation, Demonstration, Verbal cues, and Handouts Education comprehension: verbalized understanding, returned demonstration, and needs further education  HOME EXERCISE PROGRAM: Access Code: [email or web address] URL: [email or web address] Date: 01/17/2024 Prepared by: Alethia Berthold Joban Colledge  Exercises - Single Leg Bridge  - 1 x daily - 7 x weekly - 3 sets - 10 reps - Clamshell  - 1 x daily - 7 x weekly - 3 sets - 10 reps - Seated knee extension   - 1 x daily - 7 x weekly - 3 sets - 10 reps  ASSESSMENT:  CLINICAL IMPRESSION: Emphasis of skilled PT session on weightbearing tolerance, transfers and improved A/ROM of R knee extension. Pt has had rough week, battling the flu and having a tooth extraction day prior. Despite this, pt still able to perform 4 sit to stands and hold final stand for 2 minutes. Pt does require max A to stand, especially from lower height. Will call pt's prosthetist to discuss him joining next session to adjust socket.  Continue POC.   OBJECTIVE IMPAIRMENTS: Abnormal gait, decreased activity tolerance, decreased balance, decreased endurance, decreased mobility,  difficulty walking, decreased strength, hypomobility, increased edema, increased muscle spasms, impaired flexibility, improper body mechanics, postural dysfunction, prosthetic dependency , and pain.   ACTIVITY LIMITATIONS: carrying, lifting, bending, standing, squatting, stairs, transfers, and locomotion level  PARTICIPATION LIMITATIONS: meal prep, cleaning, laundry, driving, shopping, community activity, and yard work  PERSONAL FACTORS: Time since onset of injury/illness/exacerbation are also affecting patient's functional outcome.   REHAB POTENTIAL: Good  CLINICAL DECISION MAKING: Stable/uncomplicated  EVALUATION COMPLEXITY: Low   GOALS: Goals reviewed with patient? Yes  SHORT TERM GOALS: Target date: 02/07/2024     Patient will tolerate wearing prosthetic for 4 hours/day without discomfort or skin irritation Baseline: 1hour (1/23) Goal status: INITIAL  2.  Patient will demonstrate proper donning and doffing of the prosthetic independently without any assistance. Baseline: SBA, needs cueing (1/23) Goal status: INITIAL  3.  Patient will be be able to WB on prosthetic for total of 5 min/day to improve functional use. Baseline: unable to stand due to pain in R LE (non amputated Extremity) (1/23) Goal status: INITIAL  4.  Patient will be able to perform stand to sit with max A x 1 with use of RW and be able to perform stand pivot transfers.  Baseline: lateral scoot from chair to bed (SBA) (1/23) Goal status: INITIAL  LONG TERM GOALS: Target date: 03/06/2024   Patient will be able to ambulate 20 feet with RW and CGA to improve functional ambulation Baseline: hasn't ambulated since 2018 (1/23) Goal status: INITIAL  2.  Pt will be able to perform stand transer with walker with SBA only Baseline: lateral scoot with SBA (1/23) Goal status: INITIAL  3.  Patient will demo 15% improvement on LEFS to improve functional ability Baseline: 8/80 (1/23) Goal status: INITIAL  4.   Patient will be able to stand for at least 15 min per day in total with use of RW or appropriate AD to improve functional WB Baseline: unable to stand (1/23) Goal status: INITIAL    PLAN:  PT FREQUENCY: 2x/week  PT DURATION: 8 weeks  PLANNED INTERVENTIONS: [id number]- PT Re-evaluation, 97110-Therapeutic exercises, 97530- Therapeutic activity, 97112- Neuromuscular re-education, 97535- Self Care, 16109- Manual therapy, [phone]- Gait training, [phone]- Prosthetic training, Patient/Family education, Balance training, Stair training, Joint mobilization, Scar mobilization, DME instructions, Wheelchair mobility training, Cryotherapy, and Moist heat  PLAN FOR NEXT SESSION: Stand pivot? Pt is +2 asssist currently due to patella tendon rupture in bilateral knees. Start w/scifit for improved knee extension ROM. Try stands from elevated mat (25"). Stedy (to allow more knee flexion) - prolonged standing holds! Core stability, prone glutes, weight shifts     Did pt hear from [place] [facility]? Appt 2/18   Jill Alexanders Alessandro Griep, PT, DPT 01/31/2024, 11:08 AM

## 2024-02-05 ENCOUNTER — Ambulatory Visit: Payer: 59 | Admitting: Physical Therapy

## 2024-02-05 DIAGNOSIS — M25551 Pain in right hip: Secondary | ICD-10-CM | POA: Diagnosis not present

## 2024-02-05 DIAGNOSIS — G8929 Other chronic pain: Secondary | ICD-10-CM | POA: Diagnosis not present

## 2024-02-05 DIAGNOSIS — R2689 Other abnormalities of gait and mobility: Secondary | ICD-10-CM

## 2024-02-05 DIAGNOSIS — R2681 Unsteadiness on feet: Secondary | ICD-10-CM

## 2024-02-05 DIAGNOSIS — M6281 Muscle weakness (generalized): Secondary | ICD-10-CM | POA: Diagnosis not present

## 2024-02-05 DIAGNOSIS — M25561 Pain in right knee: Secondary | ICD-10-CM | POA: Diagnosis not present

## 2024-02-05 NOTE — Therapy (Signed)
OUTPATIENT PHYSICAL THERAPY PROSTHETICS TREATMENT   Patient Name: Katrina Wolfe MRN: [id number] DOB:05-Dec-1966, 58 y.o., female Today's Date: 02/05/2024  PCP: Dr. Tyson Dense REFERRING PROVIDER: Nadara Mustard, MD  END OF SESSION:  PT End of Session - 02/05/24 0935     Visit Number 7    Number of Visits 13    Date for PT Re-Evaluation 03/06/24    Authorization Type [employer] Medicare/ [employer]    PT Start Time [phone]    PT Stop Time 1013    PT Time Calculation (min) 40 min    Equipment Utilized During Treatment Gait belt;Other (comment)   L BKA prosthetic   Activity Tolerance Patient limited by pain;Patient tolerated treatment well    Behavior During Therapy [facility] for tasks assessed/performed               Past Medical History:  Diagnosis Date   Abnormal uterine bleeding (AUB)    Arthritis    knees, fingers   Asthma    as child, no problem since 1995   Bell's palsy 2004   CKD (chronic kidney disease), stage II    Complication of anesthesia    Congenital cardiomegaly    per pt has always been told since childhood and siblings    Diabetes mellitus without complication (HCC)    Dysrhythmia    Occasionally skipped beats   Elevated liver function tests    GERD (gastroesophageal reflux disease)    History of genital warts    HIV (human immunodeficiency virus infection) (HCC)    DX 1998--  MONITORED BY INFECTIOUS DISEASE-- DR Judyann Munson   Hypercholesterolemia    Hypertension    Intermittent palpitations    mild-- no meds   Legally blind in left eye, as defined in [place]    trauma as child   Peripheral vascular disease (HCC)    Pneumonia    PONV (postoperative nausea and vomiting)    Renal disorder    Tuberculosis    ? 2005 or 2007   Type 2 diabetes mellitus (HCC)    Type II    Uterine fibroid    VIN III (vulvar intraepithelial neoplasia III)    and Verrucoid lesion on the mons   Wears glasses    Past Surgical History:  Procedure Laterality Date    AMPUTATION Left 06/27/2023   Procedure: LEFT BELOW KNEE AMPUTATION;  Surgeon: Nadara Mustard, MD;  Location: [facility] OR;  Service: Orthopedics;  Laterality: Left;   ANKLE FUSION Left 02/21/2023   Procedure: REVISION LEFT ANKLE FUSION;  Surgeon: Nadara Mustard, MD;  Location: [facility] OR;  Service: Orthopedics;  Laterality: Left;   CESAREAN SECTION  2001  &  1984   CO2 LASER APPLICATION N/A 11/26/2015   Procedure: CO2 LASER APPLICATION AND WIDE VOCAL EXCSION OF LESION ON MONS PUBIS;  Surgeon: De Blanch, MD;  Location: [facility];  Service: Gynecology;  Laterality: N/A;   CASE CANCELLED    CO2 LASER APPLICATION N/A 01/07/2016   Procedure: CO2 LASER OF VULVAR;  Surgeon: De Blanch, MD;  Location: [facility];  Service: Gynecology;  Laterality: N/A;   CORNEAL TRANSPLANT Left 12/18/2004   failed   D & C HYSTEROSCOPY /  RESECTION FIBROID  12/18/2002   DILATATION & CURETTAGE/HYSTEROSCOPY WITH MYOSURE N/A 10/01/2019   Procedure: DILATATION & CURETTAGE/HYSTEROSCOPY WITH MYOSURE and HYDROTHERMAL ABLATION;  Surgeon: Geryl Rankins, MD;  Location: [facility];  Service: Gynecology;  Laterality: N/A;  HTA rep  will be here. Confirmed on 09/25/19 CS   EYE SURGERY     FOOT ARTHRODESIS Left 11/30/2021   Procedure: LEFT TIBIOCALCANEAL FUSION;  Surgeon: Nadara Mustard, MD;  Location: [facility] OR;  Service: Orthopedics;  Laterality: Left;   HARDWARE REMOVAL Left 04/04/2019   Procedure: EXCISION BONE AND REMOVE DEEP HARDWARE LEFT 5TH METATARSAL;  Surgeon: Nadara Mustard, MD;  Location: [facility] OR;  Service: Orthopedics;  Laterality: Left;   HARDWARE REMOVAL Left 08/04/2022   Procedure: REMOVAL DEEP HARDWARE LEFT ANKLE;  Surgeon: Nadara Mustard, MD;  Location: [facility] OR;  Service: Orthopedics;  Laterality: Left;   I & D EXTREMITY Left 06/27/2021   Procedure: IRRIGATION AND DEBRIDEMENT FOOT ULCER;  Surgeon: Kerrin Champagne, MD;  Location: [facility] OR;  Service: Orthopedics;   Laterality: Left;   KNEE ARTHROSCOPY W/ ACL RECONSTRUCTION Bilateral 12/18/2006   ORIF TOE FRACTURE Left 08/02/2017   Procedure: OPEN REDUCTION INTERNAL FIXATION (ORIF) FIFTH METATARSAL (TOE) BASE FRACTURE;  Surgeon: Toni Arthurs, MD;  Location: [facility];  Service: Orthopedics;  Laterality: Left;   VULVECTOMY N/A 01/07/2016   Procedure: WIDE LOCAL EXCISION;  Surgeon: De Blanch, MD;  Location: [facility];  Service: Gynecology;  Laterality: N/A;  mons pubis as site   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, with necrosis of bone (HCC)    Diabetes mellitus type 2, uncontrolled, with complications 06/26/2021   Diabetic nephropathy (HCC) 06/26/2021   CKD (chronic kidney disease), stage III (HCC) 06/26/2021   HTN (hypertension) 06/24/2021   HIV (human immunodeficiency virus infection) (HCC) 06/24/2021   DM2 (diabetes mellitus, type 2) (HCC) 06/24/2021   HLD (hyperlipidemia) 06/24/2021   Osteomyelitis of left foot (HCC) 06/24/2021   Elevated LFTs 06/24/2021   Acute-on-chronic kidney injury (HCC) 06/24/2021   Osteomyelitis of foot (HCC) 04/04/2019   Hardware complicating wound infection (HCC)    Subacute osteomyelitis, left ankle and foot (HCC)    Open displaced fracture of fifth metatarsal bone of left foot    Healthcare maintenance 04/03/2019   HIV disease (HCC) 10/27/2015   DM type 2 (diabetes mellitus, type 2) (HCC) 10/27/2015   Hyperlipidemia 10/27/2015   Essential hypertension 10/27/2015   Severe vulvar dysplasia, histologically confirmed 10/15/2015    ONSET DATE: 12/17/2023  REFERRING DIAG: N62.952 (ICD-10-CM) - S/P BKA (below knee amputation) unilateral,  left (HCC)  THERAPY DIAG:  Other abnormalities of gait and mobility  Muscle weakness (generalized)  Unsteadiness on feet  Rationale for Evaluation and Treatment: Rehabilitation  SUBJECTIVE:   SUBJECTIVE STATEMENT: Patient presents in WC, LLE in bag on her chair. Reports a burning sensation in LLE over the past few days. Rating pain as a 5/10 today.   Pt accompanied by: self and Prosthetist, Reita Cliche   PERTINENT HISTORY: HIV, PMH includes: arthritis, asthma, Bell's palsy, CKD II, DM II, HIV, HTN, PVD, blind in L eye, and multiple previous L ankle surgeries, prior R TKA  PAIN:  Are you having pain? Yes: NPRS scale: 5/10 in R leg; 5/10 in L knee  Pain location: Lateral residual leg, pt also reports of phantom pain at night that wakes her up. Pain description: neuropathic (burning), phantom Aggravating factors: none Relieving factors: Tramadol  PRECAUTIONS:  Fall and Other: HIV  RED FLAGS: None   WEIGHT BEARING RESTRICTIONS: No  FALLS: Has patient fallen in last 6 months? No  LIVING ENVIRONMENT: Lives with: lives with their family and both sons, daughter in Social worker, grandkids Lives in: House/apartment Home Access: Stairs to enter and Ramped entrance Home layout: Two level Stairs:  has stairs but also has ramp entry. Has following equipment at home: Dan Humphreys - 4 wheeled and shower chair  PLOF: Requires assistive device for independence and Needs assistance with gait  PATIENT GOALS: to be able to walk better (pt hasn't walked well since 2018)  OBJECTIVE:  Note: Objective measures were completed at Evaluation unless otherwise noted.  COGNITION: Overall cognitive status: Within functional limits for tasks assessed   LOWER EXTREMITY ROM:  Active ROM Right eval Left eval  Hip flexion    Hip extension    Hip abduction    Hip adduction    Hip internal rotation    Hip external rotation    Knee flexion    Knee extension    Ankle dorsiflexion    Ankle plantarflexion     Ankle inversion    Ankle eversion     (Blank rows = not tested)  LOWER EXTREMITY MMT:  MMT Right eval Left eval  Hip flexion    Hip extension    Hip abduction    Hip adduction    Hip internal rotation    Hip external rotation    Knee flexion    Knee extension    Ankle dorsiflexion    Ankle plantarflexion    Ankle inversion    Ankle eversion    (Blank rows = not tested)  TRANSFERS: Sit to stand: Total A Stand to sit: Total A Lateral scoot transfers: SBA  GAIT: UNABLE  FUNCTIONAL TESTS: Unable to perform any functional tests as patient is non ambulatory at this time    CURRENT PROSTHETIC WEAR ASSESSMENT: Patient is independent with: skin check, residual limb care, prosthetic cleaning, and ply sock cleaning Patient is dependent with: residual limb care, correct ply sock adjustment, proper wear schedule/adjustment, and proper weight-bearing schedule/adjustment Donning prosthesis: SBA Doffing prosthesis: SBA Prosthetic wear tolerance: 1 hours/day, 7 days/week Prosthetic weight bearing tolerance: 0 minutes Edema: present Residual limb condition: dry scabs present, well healed   PATIENT SURVEYS:  LEFS 8/80                                                                                                                            TREATMENT DATE:  Self-Care: Pt donned L BKA w/support from prosthetist as pt has difficulty aligning liner straight, resulting in crooked pin.  Bobby assisted in donning Icarus brace to RLE as well as pt placing brace too low on leg.    NMR  Pt performed lateral transfer from WC to mat table mod I to L side.   Elevated height of mat to 63 cm and placed 2" step under RLE in [place] to  accommodate for leg length discrepancy and offload some weight from RLE.  Sit to stand in stedy x1 reps w/mod A to allow Bobby to measure length of legs and position of pelvis to properly adjust socket, BLE strength and endurance. Pt held stand for ~3.5-4 minutes  w/heavy forward flexed posture, RLE trembling throughout. Cued pt to perform weight shift to L side to offload RLE, which pt able to do w/min A. Pt unable to lift RLE once standing.  Stand to sit w/min A to assist w/eccentric control  Reita Cliche took pt's L prosthesis to adjust and will return to clinic next week.  The following were performed on mat table for improved posterior chain strength, quad strength and lateral weight shifting:  Prone glute kickbacks, x15 per side. Added to HEP. Very difficult for pt and she relied on lifting hip to perform  Seated hip abduction w/red theraband, x16 reps  Alt banded marches w/red theraband, x10 reps per side  Banded seated march overs using 10# KB, x10 reps on RLE only  Knee extension over 10# KB, x10 res on RLE. Pt very challenged by this and relied on hip flexion to lift leg over object rather than extend leg.   RPE of 5/10 following acitivity   PATIENT EDUCATION: Education details:  Continue HEP, next appointment time and date  Person educated: Patient Education method: Explanation, Demonstration, Verbal cues, and Handouts Education comprehension: verbalized understanding, returned demonstration, and needs further education  HOME EXERCISE PROGRAM: Access Code: [email or web address] URL: [email or web address] Date: 01/17/2024 Prepared by: Alethia Berthold Marialice Newkirk  Exercises - Single Leg Bridge  - 1 x daily - 7 x weekly - 3 sets - 10 reps - Clamshell  - 1 x daily - 7 x weekly - 3 sets - 10 reps - Seated knee extension   - 1 x daily - 7 x weekly - 3 sets - 10 reps - Beginner Prone Single Leg Raise  - 1 x daily - 7 x weekly - 1-2 sets - 10-15 reps  ASSESSMENT:  CLINICAL IMPRESSION: Emphasis of skilled PT session on assessing limb length, endurance, and functional BLE strength. Prosthetist, Reita Cliche, present for session to assess need to L prosthetic adjustment. Pt able to hold stand in [place] for ~4 minutes today, but relies heavily on BUE support and forward  flexed posture. Pt's RLE trembling throughout remainder of session but pt did tolerate well. Reita Cliche took pt's L prosthesis to adjust at [place] and will return to clinic by next week. Continue POC.   OBJECTIVE IMPAIRMENTS: Abnormal gait, decreased activity tolerance, decreased balance, decreased endurance, decreased mobility, difficulty walking, decreased strength, hypomobility, increased edema, increased muscle spasms, impaired flexibility, improper body mechanics, postural dysfunction, prosthetic dependency , and pain.   ACTIVITY LIMITATIONS: carrying, lifting, bending, standing, squatting, stairs, transfers, and locomotion level  PARTICIPATION LIMITATIONS: meal prep, cleaning, laundry, driving, shopping, community activity, and yard work  PERSONAL FACTORS: Time since onset of injury/illness/exacerbation are also affecting patient's functional outcome.   REHAB POTENTIAL: Good  CLINICAL DECISION MAKING: Stable/uncomplicated  EVALUATION COMPLEXITY: Low   GOALS: Goals reviewed with patient? Yes  SHORT TERM GOALS: Target date: 02/07/2024     Patient will tolerate wearing prosthetic for 4 hours/day without discomfort or skin irritation Baseline: 1hour (1/23) Goal status: INITIAL  2.  Patient will demonstrate proper donning and doffing of the prosthetic independently without any assistance. Baseline: SBA, needs cueing (1/23) Goal status: INITIAL  3.  Patient will be be able to WB on prosthetic for total of 5  min/day to improve functional use. Baseline: unable to stand due to pain in R LE (non amputated Extremity) (1/23) Goal status: INITIAL  4.  Patient will be able to perform stand to sit with max A x 1 with use of RW and be able to perform stand pivot transfers.  Baseline: lateral scoot from chair to bed (SBA) (1/23) Goal status: INITIAL   LONG TERM GOALS: Target date: 03/06/2024   Patient will be able to ambulate 20 feet with RW and CGA to improve functional  ambulation Baseline: hasn't ambulated since 2018 (1/23) Goal status: INITIAL  2.  Pt will be able to perform stand transer with walker with SBA only Baseline: lateral scoot with SBA (1/23) Goal status: INITIAL  3.  Patient will demo 15% improvement on LEFS to improve functional ability Baseline: 8/80 (1/23) Goal status: INITIAL  4.  Patient will be able to stand for at least 15 min per day in total with use of RW or appropriate AD to improve functional WB Baseline: unable to stand (1/23) Goal status: INITIAL    PLAN:  PT FREQUENCY: 2x/week  PT DURATION: 8 weeks  PLANNED INTERVENTIONS: 97164- PT Re-evaluation, 97110-Therapeutic exercises, 97530- Therapeutic activity, 97112- Neuromuscular re-education, 97535- Self Care, 16109- Manual therapy, [phone]- Gait training, [phone]- Prosthetic training, Patient/Family education, Balance training, Stair training, Joint mobilization, Scar mobilization, DME instructions, Wheelchair mobility training, Cryotherapy, and Moist heat  PLAN FOR NEXT SESSION: Goals. Stand pivot? Pt is +2 asssist currently due to patella tendon rupture in bilateral knees. Start w/scifit for improved knee extension ROM. Try stands from elevated mat (25"). Stedy (to allow more knee flexion) - prolonged standing holds! Core stability, prone glutes, weight shifts      Jill Alexanders Vi Biddinger, PT, DPT 02/05/2024, 10:26 AM

## 2024-02-07 ENCOUNTER — Ambulatory Visit: Payer: 59 | Admitting: Physical Therapy

## 2024-02-12 ENCOUNTER — Ambulatory Visit: Payer: 59 | Admitting: Physical Therapy

## 2024-02-12 DIAGNOSIS — R2689 Other abnormalities of gait and mobility: Secondary | ICD-10-CM | POA: Diagnosis not present

## 2024-02-12 DIAGNOSIS — R2681 Unsteadiness on feet: Secondary | ICD-10-CM

## 2024-02-12 DIAGNOSIS — M25561 Pain in right knee: Secondary | ICD-10-CM | POA: Diagnosis not present

## 2024-02-12 DIAGNOSIS — G8929 Other chronic pain: Secondary | ICD-10-CM

## 2024-02-12 DIAGNOSIS — M6281 Muscle weakness (generalized): Secondary | ICD-10-CM

## 2024-02-12 DIAGNOSIS — M25551 Pain in right hip: Secondary | ICD-10-CM | POA: Diagnosis not present

## 2024-02-12 NOTE — Therapy (Signed)
 OUTPATIENT PHYSICAL THERAPY PROSTHETICS TREATMENT   Patient Name: Katrina Wolfe MRN: [id number] DOB:1966/05/31, 58 y.o., female Today's Date: 02/12/2024  PCP: Dr. Tyson Dense REFERRING PROVIDER: Nadara Mustard, MD  END OF SESSION:  PT End of Session - 02/12/24 0943     Visit Number 8    Number of Visits 13    Date for PT Re-Evaluation 03/06/24    Authorization Type [employer] Medicare/ [employer]    PT Start Time [phone]   Pt arrived late   PT Stop Time 1012    PT Time Calculation (min) 30 min    Equipment Utilized During Treatment Gait belt;Other (comment)   L BKA prosthetic   Activity Tolerance Patient tolerated treatment well    Behavior During Therapy WFL for tasks assessed/performed               Past Medical History:  Diagnosis Date   Abnormal uterine bleeding (AUB)    Arthritis    knees, fingers   Asthma    as child, no problem since 1995   Bell's palsy 2004   CKD (chronic kidney disease), stage II    Complication of anesthesia    Congenital cardiomegaly    per pt has always been told since childhood and siblings    Diabetes mellitus without complication (HCC)    Dysrhythmia    Occasionally skipped beats   Elevated liver function tests    GERD (gastroesophageal reflux disease)    History of genital warts    HIV (human immunodeficiency virus infection) (HCC)    DX 1998--  MONITORED BY INFECTIOUS DISEASE-- DR Judyann Munson   Hypercholesterolemia    Hypertension    Intermittent palpitations    mild-- no meds   Legally blind in left eye, as defined in [place]    trauma as child   Peripheral vascular disease (HCC)    Pneumonia    PONV (postoperative nausea and vomiting)    Renal disorder    Tuberculosis    ? 2005 or 2007   Type 2 diabetes mellitus (HCC)    Type II    Uterine fibroid    VIN III (vulvar intraepithelial neoplasia III)    and Verrucoid lesion on the mons   Wears glasses    Past Surgical History:  Procedure Laterality Date    AMPUTATION Left 06/27/2023   Procedure: LEFT BELOW KNEE AMPUTATION;  Surgeon: Nadara Mustard, MD;  Location: [facility] OR;  Service: Orthopedics;  Laterality: Left;   ANKLE FUSION Left 02/21/2023   Procedure: REVISION LEFT ANKLE FUSION;  Surgeon: Nadara Mustard, MD;  Location: [facility] OR;  Service: Orthopedics;  Laterality: Left;   CESAREAN SECTION  2001  &  1984   CO2 LASER APPLICATION N/A 11/26/2015   Procedure: CO2 LASER APPLICATION AND WIDE VOCAL EXCSION OF LESION ON MONS PUBIS;  Surgeon: De Blanch, MD;  Location: [facility];  Service: Gynecology;  Laterality: N/A;   CASE CANCELLED    CO2 LASER APPLICATION N/A 01/07/2016   Procedure: CO2 LASER OF VULVAR;  Surgeon: De Blanch, MD;  Location: [facility];  Service: Gynecology;  Laterality: N/A;   CORNEAL TRANSPLANT Left 12/18/2004   failed   D & C HYSTEROSCOPY /  RESECTION FIBROID  12/18/2002   DILATATION & CURETTAGE/HYSTEROSCOPY WITH MYOSURE N/A 10/01/2019   Procedure: DILATATION & CURETTAGE/HYSTEROSCOPY WITH MYOSURE and HYDROTHERMAL ABLATION;  Surgeon: Geryl Rankins, MD;  Location: [facility];  Service: Gynecology;  Laterality: N/A;  HTA  rep will be here. Confirmed on 09/25/19 CS   EYE SURGERY     FOOT ARTHRODESIS Left 11/30/2021   Procedure: LEFT TIBIOCALCANEAL FUSION;  Surgeon: Nadara Mustard, MD;  Location: [facility] OR;  Service: Orthopedics;  Laterality: Left;   HARDWARE REMOVAL Left 04/04/2019   Procedure: EXCISION BONE AND REMOVE DEEP HARDWARE LEFT 5TH METATARSAL;  Surgeon: Nadara Mustard, MD;  Location: [facility] OR;  Service: Orthopedics;  Laterality: Left;   HARDWARE REMOVAL Left 08/04/2022   Procedure: REMOVAL DEEP HARDWARE LEFT ANKLE;  Surgeon: Nadara Mustard, MD;  Location: [facility] OR;  Service: Orthopedics;  Laterality: Left;   I & D EXTREMITY Left 06/27/2021   Procedure: IRRIGATION AND DEBRIDEMENT FOOT ULCER;  Surgeon: Kerrin Champagne, MD;  Location: [facility] OR;  Service: Orthopedics;   Laterality: Left;   KNEE ARTHROSCOPY W/ ACL RECONSTRUCTION Bilateral 12/18/2006   ORIF TOE FRACTURE Left 08/02/2017   Procedure: OPEN REDUCTION INTERNAL FIXATION (ORIF) FIFTH METATARSAL (TOE) BASE FRACTURE;  Surgeon: Toni Arthurs, MD;  Location: [facility];  Service: Orthopedics;  Laterality: Left;   VULVECTOMY N/A 01/07/2016   Procedure: WIDE LOCAL EXCISION;  Surgeon: De Blanch, MD;  Location: [facility];  Service: Gynecology;  Laterality: N/A;  mons pubis as site   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, with necrosis of bone (HCC)    Diabetes mellitus type 2, uncontrolled, with complications 06/26/2021   Diabetic nephropathy (HCC) 06/26/2021   CKD (chronic kidney disease), stage III (HCC) 06/26/2021   HTN (hypertension) 06/24/2021   HIV (human immunodeficiency virus infection) (HCC) 06/24/2021   DM2 (diabetes mellitus, type 2) (HCC) 06/24/2021   HLD (hyperlipidemia) 06/24/2021   Osteomyelitis of left foot (HCC) 06/24/2021   Elevated LFTs 06/24/2021   Acute-on-chronic kidney injury (HCC) 06/24/2021   Osteomyelitis of foot (HCC) 04/04/2019   Hardware complicating wound infection (HCC)    Subacute osteomyelitis, left ankle and foot (HCC)    Open displaced fracture of fifth metatarsal bone of left foot    Healthcare maintenance 04/03/2019   HIV disease (HCC) 10/27/2015   DM type 2 (diabetes mellitus, type 2) (HCC) 10/27/2015   Hyperlipidemia 10/27/2015   Essential hypertension 10/27/2015   Severe vulvar dysplasia, histologically confirmed 10/15/2015    ONSET DATE: 12/17/2023  REFERRING DIAG: W09.811 (ICD-10-CM) - S/P BKA (below knee amputation) unilateral,  left (HCC)  THERAPY DIAG:  Other abnormalities of gait and mobility  Muscle weakness (generalized)  Unsteadiness on feet  Chronic pain of right knee  Rationale for Evaluation and Treatment: Rehabilitation  SUBJECTIVE:   SUBJECTIVE STATEMENT: Patient presents in WC. States she had a rough weekend but no pain today.   Pt accompanied by: self   PERTINENT HISTORY: HIV, PMH includes: arthritis, asthma, Bell's palsy, CKD II, DM II, HIV, HTN, PVD, blind in L eye, and multiple previous L ankle surgeries, prior R TKA  PAIN:  Are you having pain? No: NPRS scale: 0/10 in R leg; 0/10 in L knee  Pain location: Lateral residual leg, pt also reports of phantom pain at night that wakes her up. Pain description: neuropathic (burning), phantom Aggravating factors: none Relieving factors: Tramadol  PRECAUTIONS: Fall and Other: HIV  RED FLAGS: None  WEIGHT BEARING RESTRICTIONS: No  FALLS: Has patient fallen in last 6 months? No  LIVING ENVIRONMENT: Lives with: lives with their family and both sons, daughter in Social worker, grandkids Lives in: House/apartment Home Access: Stairs to enter and Ramped entrance Home layout: Two level Stairs:  has stairs but also has ramp entry. Has following equipment at home: Dan Humphreys - 4 wheeled and shower chair  PLOF: Requires assistive device for independence and Needs assistance with gait  PATIENT GOALS: to be able to walk better (pt hasn't walked well since 2018)  OBJECTIVE:  Note: Objective measures were completed at Evaluation unless otherwise noted.  COGNITION: Overall cognitive status: Within functional limits for tasks assessed   LOWER EXTREMITY ROM:  Active ROM Right eval Left eval  Hip flexion    Hip extension    Hip abduction    Hip adduction    Hip internal rotation    Hip external rotation    Knee flexion    Knee extension    Ankle dorsiflexion    Ankle plantarflexion    Ankle inversion    Ankle eversion     (Blank rows = not  tested)  LOWER EXTREMITY MMT:  MMT Right eval Left eval  Hip flexion    Hip extension    Hip abduction    Hip adduction    Hip internal rotation    Hip external rotation    Knee flexion    Knee extension    Ankle dorsiflexion    Ankle plantarflexion    Ankle inversion    Ankle eversion    (Blank rows = not tested)  TRANSFERS: Sit to stand: Total A Stand to sit: Total A Lateral scoot transfers: SBA  GAIT: UNABLE  FUNCTIONAL TESTS: Unable to perform any functional tests as patient is non ambulatory at this time    CURRENT PROSTHETIC WEAR ASSESSMENT: Patient is independent with: skin check, residual limb care, prosthetic cleaning, and ply sock cleaning Patient is dependent with: residual limb care, correct ply sock adjustment, proper wear schedule/adjustment, and proper weight-bearing schedule/adjustment Donning prosthesis: SBA Doffing prosthesis: SBA Prosthetic wear tolerance: 1 hours/day, 7 days/week Prosthetic weight bearing tolerance: 0 minutes Edema: present Residual limb condition: dry scabs present, well healed   PATIENT SURVEYS:  LEFS 8/80                                                                                                                            TREATMENT DATE:  Self-Care: Pt donned newly adjusted L BKA prosthetic mod I and Icarus brace w/min A   Ther Act  SciFit multi-peaks level 5.5 for 6 minutes using BUE/BLEs for neural priming for reciprocal movement, dynamic cardiovascular warmup and increased amplitude of stepping. Noted increased ease of placing legs into pedals w/shortened prosthesis.    NMR  In // bars for improved transfers, pre-gait training and weightbearing tolerance on LLE:  Pt performed sit to stands in bars w/CGA-min A and was able to  hold stand for >1 minute at a time w/min cues for hip extension. CGA while standing.  Pt ambulated length of // bars x2 w/CGA-min A and close WC follow. Cues for increased step length w/LLE  and provided min guard at L knee in case of buckling. Pt relying heavily on BUE support to progress RLE but no buckling noted.   Gait Training  Gait pattern: step through pattern, decreased step length- Left, decreased stance time- Right, decreased stride length, decreased hip/knee flexion- Right, decreased hip/knee flexion- Left, antalgic, lateral hip instability, and lateral lean- Left Distance walked: 4', 11'  Assistive device utilized: Environmental consultant - 2 wheeled Level of assistance: Min A and close WC follow  Comments: Pt able to perform sit to stand from [facility] to RW w/CGA. Min A to assist progression of RW and min cues for increased step length w/LLE    RPE of 4/10 following acitivity   PATIENT EDUCATION: Education details:  Continue HEP, next appointment time and date, resume wear schedule w/prosthetic  Person educated: Patient Education method: Programmer, multimedia, Demonstration, Verbal cues, and Handouts Education comprehension: verbalized understanding, returned demonstration, and needs further education  HOME EXERCISE PROGRAM: Access Code: [email or web address] URL: [email or web address] Date: 01/17/2024 Prepared by: Alethia Berthold Dewarren Ledbetter  Exercises - Single Leg Bridge  - 1 x daily - 7 x weekly - 3 sets - 10 reps - Clamshell  - 1 x daily - 7 x weekly - 3 sets - 10 reps - Seated knee extension   - 1 x daily - 7 x weekly - 3 sets - 10 reps - Beginner Prone Single Leg Raise  - 1 x daily - 7 x weekly - 1-2 sets - 10-15 reps  ASSESSMENT:  CLINICAL IMPRESSION: Emphasis of skilled PT session on STG assessment, pre-gait and gait training w/newly adjusted L prosthesis. Pt able to perform sit to stands from [facility] this date w/CGA-min A in // bars and to RW for first time. Pt initiated gait training this date, w/longest bout being 4' w/RW and min A. Pt has met 1 STG this date but will further assess goals next session. Continue POC.   OBJECTIVE IMPAIRMENTS: Abnormal gait, decreased activity tolerance,  decreased balance, decreased endurance, decreased mobility, difficulty walking, decreased strength, hypomobility, increased edema, increased muscle spasms, impaired flexibility, improper body mechanics, postural dysfunction, prosthetic dependency , and pain.   ACTIVITY LIMITATIONS: carrying, lifting, bending, standing, squatting, stairs, transfers, and locomotion level  PARTICIPATION LIMITATIONS: meal prep, cleaning, laundry, driving, shopping, community activity, and yard work  PERSONAL FACTORS: Time since onset of injury/illness/exacerbation are also affecting patient's functional outcome.   REHAB POTENTIAL: Good  CLINICAL DECISION MAKING: Stable/uncomplicated  EVALUATION COMPLEXITY: Low   GOALS: Goals reviewed with patient? Yes  SHORT TERM GOALS: Target date: 02/07/2024     Patient will tolerate wearing prosthetic for 4 hours/day without discomfort or skin irritation Baseline: 1hour (1/23) Goal status: INITIAL  2.  Patient will demonstrate proper donning and doffing of the prosthetic independently without any assistance. Baseline: SBA, needs cueing (1/23) Goal status: MET  3.  Patient will be be able to WB on prosthetic for total of 5 min/day to improve functional use. Baseline: unable to stand due to pain in R LE (non amputated Extremity) (1/23) Goal status: INITIAL  4.  Patient will be able to perform stand to sit with max A x 1 with use of RW and be able to perform stand pivot transfers.  Baseline: lateral scoot from chair to bed (SBA) (1/23); sit to stands  w/min A  Goal status: IN PROGRESS    LONG TERM GOALS: Target date: 03/06/2024   Patient will be able to ambulate 20 feet with RW and CGA to improve functional ambulation Baseline: hasn't ambulated since 2018 (1/23) Goal status: INITIAL  2.  Pt will be able to perform stand transer with walker with SBA only Baseline: lateral scoot with SBA (1/23) Goal status: INITIAL  3.  Patient will demo 15% improvement on  LEFS to improve functional ability Baseline: 8/80 (1/23) Goal status: INITIAL  4.  Patient will be able to stand for at least 15 min per day in total with use of RW or appropriate AD to improve functional WB Baseline: unable to stand (1/23) Goal status: INITIAL    PLAN:  PT FREQUENCY: 2x/week  PT DURATION: 8 weeks  PLANNED INTERVENTIONS: 97164- PT Re-evaluation, 97110-Therapeutic exercises, 97530- Therapeutic activity, 97112- Neuromuscular re-education, 97535- Self Care, 16109- Manual therapy, [phone]- Gait training, [phone]- Prosthetic training, Patient/Family education, Balance training, Stair training, Joint mobilization, Scar mobilization, DME instructions, Wheelchair mobility training, Cryotherapy, and Moist heat  PLAN FOR NEXT SESSION: Goals. Stand pivot. Pt is +2 asssist currently due to patella tendon rupture in bilateral knees. Start w/scifit for improved knee extension ROM. Gait Training w/WC follow    Jill Alexanders Gwendy Boeder, PT, DPT 02/12/2024, 10:14 AM

## 2024-02-14 ENCOUNTER — Ambulatory Visit: Payer: 59 | Admitting: Physical Therapy

## 2024-02-14 DIAGNOSIS — R2689 Other abnormalities of gait and mobility: Secondary | ICD-10-CM

## 2024-02-14 DIAGNOSIS — M25561 Pain in right knee: Secondary | ICD-10-CM | POA: Diagnosis not present

## 2024-02-14 DIAGNOSIS — M6281 Muscle weakness (generalized): Secondary | ICD-10-CM | POA: Diagnosis not present

## 2024-02-14 DIAGNOSIS — G8929 Other chronic pain: Secondary | ICD-10-CM | POA: Diagnosis not present

## 2024-02-14 DIAGNOSIS — R2681 Unsteadiness on feet: Secondary | ICD-10-CM

## 2024-02-14 DIAGNOSIS — M25551 Pain in right hip: Secondary | ICD-10-CM

## 2024-02-14 NOTE — Therapy (Addendum)
 OUTPATIENT PHYSICAL THERAPY PROSTHETICS TREATMENT   Patient Name: Katrina Wolfe MRN: [id number] DOB:1966/08/01, 58 y.o., female Today's Date: 02/14/2024  PCP: Dr. Tyson Dense REFERRING PROVIDER: Nadara Mustard, MD  END OF SESSION:  PT End of Session - 02/14/24 1019     Visit Number 9    Number of Visits 13    Date for PT Re-Evaluation 03/06/24    Authorization Type [employer] Medicare/ [employer]    PT Start Time 1017    PT Stop Time 1059    PT Time Calculation (min) 42 min    Equipment Utilized During Treatment Gait belt;Other (comment)   L BKA prosthetic   Activity Tolerance Patient limited by pain    Behavior During Therapy WFL for tasks assessed/performed                Past Medical History:  Diagnosis Date   Abnormal uterine bleeding (AUB)    Arthritis    knees, fingers   Asthma    as child, no problem since 1995   Bell's palsy 2004   CKD (chronic kidney disease), stage II    Complication of anesthesia    Congenital cardiomegaly    per pt has always been told since childhood and siblings    Diabetes mellitus without complication (HCC)    Dysrhythmia    Occasionally skipped beats   Elevated liver function tests    GERD (gastroesophageal reflux disease)    History of genital warts    HIV (human immunodeficiency virus infection) (HCC)    DX 1998--  MONITORED BY INFECTIOUS DISEASE-- DR Judyann Munson   Hypercholesterolemia    Hypertension    Intermittent palpitations    mild-- no meds   Legally blind in left eye, as defined in [place]    trauma as child   Peripheral vascular disease (HCC)    Pneumonia    PONV (postoperative nausea and vomiting)    Renal disorder    Tuberculosis    ? 2005 or 2007   Type 2 diabetes mellitus (HCC)    Type II    Uterine fibroid    VIN III (vulvar intraepithelial neoplasia III)    and Verrucoid lesion on the mons   Wears glasses    Past Surgical History:  Procedure Laterality Date   AMPUTATION Left 06/27/2023    Procedure: LEFT BELOW KNEE AMPUTATION;  Surgeon: Nadara Mustard, MD;  Location: [facility] OR;  Service: Orthopedics;  Laterality: Left;   ANKLE FUSION Left 02/21/2023   Procedure: REVISION LEFT ANKLE FUSION;  Surgeon: Nadara Mustard, MD;  Location: [facility] OR;  Service: Orthopedics;  Laterality: Left;   CESAREAN SECTION  2001  &  1984   CO2 LASER APPLICATION N/A 11/26/2015   Procedure: CO2 LASER APPLICATION AND WIDE VOCAL EXCSION OF LESION ON MONS PUBIS;  Surgeon: De Blanch, MD;  Location: [facility];  Service: Gynecology;  Laterality: N/A;   CASE CANCELLED    CO2 LASER APPLICATION N/A 01/07/2016   Procedure: CO2 LASER OF VULVAR;  Surgeon: De Blanch, MD;  Location: [facility];  Service: Gynecology;  Laterality: N/A;   CORNEAL TRANSPLANT Left 12/18/2004   failed   D & C HYSTEROSCOPY /  RESECTION FIBROID  12/18/2002   DILATATION & CURETTAGE/HYSTEROSCOPY WITH MYOSURE N/A 10/01/2019   Procedure: DILATATION & CURETTAGE/HYSTEROSCOPY WITH MYOSURE and HYDROTHERMAL ABLATION;  Surgeon: Geryl Rankins, MD;  Location: [facility];  Service: Gynecology;  Laterality: N/A;  HTA rep will be  here. Confirmed on 09/25/19 CS   EYE SURGERY     FOOT ARTHRODESIS Left 11/30/2021   Procedure: LEFT TIBIOCALCANEAL FUSION;  Surgeon: Nadara Mustard, MD;  Location: [facility] OR;  Service: Orthopedics;  Laterality: Left;   HARDWARE REMOVAL Left 04/04/2019   Procedure: EXCISION BONE AND REMOVE DEEP HARDWARE LEFT 5TH METATARSAL;  Surgeon: Nadara Mustard, MD;  Location: [facility] OR;  Service: Orthopedics;  Laterality: Left;   HARDWARE REMOVAL Left 08/04/2022   Procedure: REMOVAL DEEP HARDWARE LEFT ANKLE;  Surgeon: Nadara Mustard, MD;  Location: [facility] OR;  Service: Orthopedics;  Laterality: Left;   I & D EXTREMITY Left 06/27/2021   Procedure: IRRIGATION AND DEBRIDEMENT FOOT ULCER;  Surgeon: Kerrin Champagne, MD;  Location: [facility] OR;  Service: Orthopedics;  Laterality: Left;   KNEE ARTHROSCOPY  W/ ACL RECONSTRUCTION Bilateral 12/18/2006   ORIF TOE FRACTURE Left 08/02/2017   Procedure: OPEN REDUCTION INTERNAL FIXATION (ORIF) FIFTH METATARSAL (TOE) BASE FRACTURE;  Surgeon: Toni Arthurs, MD;  Location: [facility];  Service: Orthopedics;  Laterality: Left;   VULVECTOMY N/A 01/07/2016   Procedure: WIDE LOCAL EXCISION;  Surgeon: De Blanch, MD;  Location: [facility];  Service: Gynecology;  Laterality: N/A;  mons pubis as site   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, with necrosis of bone (HCC)    Diabetes mellitus type 2, uncontrolled, with complications 06/26/2021   Diabetic nephropathy (HCC) 06/26/2021   CKD (chronic kidney disease), stage III (HCC) 06/26/2021   HTN (hypertension) 06/24/2021   HIV (human immunodeficiency virus infection) (HCC) 06/24/2021   DM2 (diabetes mellitus, type 2) (HCC) 06/24/2021   HLD (hyperlipidemia) 06/24/2021   Osteomyelitis of left foot (HCC) 06/24/2021   Elevated LFTs 06/24/2021   Acute-on-chronic kidney injury (HCC) 06/24/2021   Osteomyelitis of foot (HCC) 04/04/2019   Hardware complicating wound infection (HCC)    Subacute osteomyelitis, left ankle and foot (HCC)    Open displaced fracture of fifth metatarsal bone of left foot    Healthcare maintenance 04/03/2019   HIV disease (HCC) 10/27/2015   DM type 2 (diabetes mellitus, type 2) (HCC) 10/27/2015   Hyperlipidemia 10/27/2015   Essential hypertension 10/27/2015   Severe vulvar dysplasia, histologically confirmed 10/15/2015    ONSET DATE: 12/17/2023  REFERRING DIAG: Z61.096 (ICD-10-CM) - S/P BKA (below knee amputation) unilateral, left (HCC)  THERAPY DIAG:  Other  abnormalities of gait and mobility  Muscle weakness (generalized)  Unsteadiness on feet  Chronic pain of right knee  Pain in right hip  Rationale for Evaluation and Treatment: Rehabilitation  SUBJECTIVE:   SUBJECTIVE STATEMENT: Patient presents in WC. States she felt pretty good after last session. Having some R hip pain but nothing in her knees.   Pt accompanied by: self   PERTINENT HISTORY: HIV, PMH includes: arthritis, asthma, Bell's palsy, CKD II, DM II, HIV, HTN, PVD, blind in L eye, and multiple previous L ankle surgeries, prior R TKA  PAIN:  Are you having pain? Yes: NPRS scale: 4/10 in R hip  Pain location: Lateral residual leg, pt also reports of phantom pain at night that wakes her up. Pain description: neuropathic (burning), phantom Aggravating factors: none Relieving factors: Tramadol  PRECAUTIONS: Fall and Other:  HIV  RED FLAGS: None   WEIGHT BEARING RESTRICTIONS: No  FALLS: Has patient fallen in last 6 months? No  LIVING ENVIRONMENT: Lives with: lives with their family and both sons, daughter in Social worker, grandkids Lives in: House/apartment Home Access: Stairs to enter and Ramped entrance Home layout: Two level Stairs:  has stairs but also has ramp entry. Has following equipment at home: Dan Humphreys - 4 wheeled and shower chair  PLOF: Requires assistive device for independence and Needs assistance with gait  PATIENT GOALS: to be able to walk better (pt hasn't walked well since 2018)  OBJECTIVE:  Note: Objective measures were completed at Evaluation unless otherwise noted.  COGNITION: Overall cognitive status: Within functional limits for tasks assessed   LOWER EXTREMITY ROM:  Active ROM Right eval Left eval  Hip flexion    Hip extension    Hip abduction    Hip adduction    Hip internal rotation    Hip external rotation    Knee flexion    Knee extension    Ankle dorsiflexion    Ankle plantarflexion    Ankle inversion    Ankle eversion      (Blank rows = not tested)  LOWER EXTREMITY MMT:  MMT Right eval Left eval  Hip flexion    Hip extension    Hip abduction    Hip adduction    Hip internal rotation    Hip external rotation    Knee flexion    Knee extension    Ankle dorsiflexion    Ankle plantarflexion    Ankle inversion    Ankle eversion    (Blank rows = not tested)  TRANSFERS: Sit to stand: Total A Stand to sit: Total A Lateral scoot transfers: SBA  GAIT: UNABLE  FUNCTIONAL TESTS: Unable to perform any functional tests as patient is non ambulatory at this time    CURRENT PROSTHETIC WEAR ASSESSMENT: Patient is independent with: skin check, residual limb care, prosthetic cleaning, and ply sock cleaning Patient is dependent with: residual limb care, correct ply sock adjustment, proper wear schedule/adjustment, and proper weight-bearing schedule/adjustment Donning prosthesis: SBA Doffing prosthesis: SBA Prosthetic wear tolerance: 1 hours/day, 7 days/week Prosthetic weight bearing tolerance: 0 minutes Edema: present Residual limb condition: dry scabs present, well healed   PATIENT SURVEYS:  LEFS 8/80                                                                                                                            TREATMENT DATE:  Self-Care: Pt donned newly adjusted L BKA prosthetic mod I and Icarus brace w/min A   Ther Act  SciFit multi-peaks level 7 for 8 minutes using BUE/BLEs for neural priming for reciprocal movement, dynamic cardiovascular warmup and increased amplitude of stepping.  Pt performed lateral transfer from [facility] to mat table mod I Performed 2 sit to stands from mat to RW w/mod A and pt unable to tolerate stand due to pain in R hip.  In supine, attempted modified Thomas stretch and R hip distraction, but pt unable to tolerate due to pain. Performed deep tissue massage w/lacrosse ball for pain relief.   Trigger Point Dry Needling  Initial Treatment: Pt instructed on Dry  Needling rational, procedures, and possible side effects. Pt instructed to expect mild to moderate muscle soreness later in the day and/or into the next day.  Pt instructed in methods to reduce muscle soreness. Pt instructed to continue prescribed HEP. Patient was educated on signs and symptoms of infection and other risk factors and advised to seek medical attention should they occur.  Patient verbalized understanding of these instructions and education.   Patient Verbal Consent Given: Yes Education Handout Provided: Yes Muscles Treated: R quad and TFL Electrical Stimulation Performed: No Treatment Response/Outcome: deep ache/pressure; muscle twitch detected; decreased tenderness to palpation following treatment   Trigger Point Dry Needling  What is Trigger Point Dry Needling (DN)? DN is a physical therapy technique used to treat muscle pain and dysfunction. Specifically, DN helps deactivate muscle trigger points (muscle knots).  A thin filiform needle is used to penetrate the skin and stimulate the underlying trigger point. The goal is for a local twitch response (LTR) to occur and for the trigger point to relax. No medication of any kind is injected during the procedure.   What Does Trigger Point Dry Needling Feel Like?  The procedure feels different for each individual patient. Some patients report that they do not actually feel the needle enter the skin and overall the process is not painful. Very mild bleeding may occur. However, many patients feel a deep cramping in the muscle in which the needle was inserted. This is the local twitch response.   How Will I feel after the treatment? Soreness is normal, and the onset of soreness may not occur for a few hours. Typically this soreness does not last longer than two days.  Bruising is uncommon, however; ice can be used to decrease any possible bruising.  In rare cases feeling tired or nauseous after the treatment is normal. In addition,  your symptoms may get worse before they get better, this period will typically not last longer than 24 hours.   What Can I do After My Treatment? Increase your hydration by drinking more water for the next 24 hours.  You may place ice or heat on the areas treated that have become sore, however, do not use heat on inflamed or bruised areas. Heat often brings more relief post needling. You can continue your regular activities, but vigorous activity is not recommended initially after the treatment for 24 hours. DN is best combined with other physical therapy such as strengthening, stretching, and other therapies.   What are the complications? While your therapist has had extensive training in minimizing the risks of trigger point dry needling, it is important to understand the risks of any procedure.  Risks include bleeding, pain, fatigue, hematoma, infection, vertigo, nausea or nerve involvement. Monitor for any changes to your skin or sensation. Contact your therapist or MD with concerns.  A rare but serious complication is a pneumothorax over or near your middle and upper chest and back If you have dry needling in this area, monitor for the following symptoms: Shortness of breath on exertion and/or Difficulty taking a deep breath and/or Chest Pain and/or A dry cough If any of the above symptoms develop, please go to the nearest emergency room or call 911. Tell them you had dry needling over your  thorax and report any symptoms you are having. Please follow-up with your treating therapist after you complete the medical evaluation.       PATIENT EDUCATION: Education details:  Continue HEP, next appointment time and date, increase wear schedule w/prosthetic to 3 hours/day, TPDN (see above)  Person educated: Patient Education method: Explanation Education comprehension: verbalized understanding and needs further education  HOME EXERCISE PROGRAM: Access Code: [email or web address] URL:  [email or web address] Date: 01/17/2024 Prepared by: Alethia Berthold Deantre Bourdon  Exercises - Single Leg Bridge  - 1 x daily - 7 x weekly - 3 sets - 10 reps - Clamshell  - 1 x daily - 7 x weekly - 3 sets - 10 reps - Seated knee extension   - 1 x daily - 7 x weekly - 3 sets - 10 reps - Beginner Prone Single Leg Raise  - 1 x daily - 7 x weekly - 1-2 sets - 10-15 reps  ASSESSMENT:  CLINICAL IMPRESSION: Emphasis of skilled PT session on continuing STG assessment and pain modulation via TPDN. Pt reports having significant pain in R hip this date and was unable to tolerate standing or stretching. Attempted stand pivot w/RW and pt able to perform sit to stand from low mat to RW w/mod A, but immediately sat down due to pain. Pt tolerated TPDN to R hip well and reported slight decrease in pain by end of session. Pt has met 1 STG this date and has been greatly limited by prosthetic being too long and R knee pain. However, pt is progressing well towards goals and will anticipate pt meeting goals soon. Continue POC.   OBJECTIVE IMPAIRMENTS: Abnormal gait, decreased activity tolerance, decreased balance, decreased endurance, decreased mobility, difficulty walking, decreased strength, hypomobility, increased edema, increased muscle spasms, impaired flexibility, improper body mechanics, postural dysfunction, prosthetic dependency , and pain.   ACTIVITY LIMITATIONS: carrying, lifting, bending, standing, squatting, stairs, transfers, and locomotion level  PARTICIPATION LIMITATIONS: meal prep, cleaning, laundry, driving, shopping, community activity, and yard work  PERSONAL FACTORS: Time since onset of injury/illness/exacerbation are also affecting patient's functional outcome.   REHAB POTENTIAL: Good  CLINICAL DECISION MAKING: Stable/uncomplicated  EVALUATION COMPLEXITY: Low   GOALS: Goals reviewed with patient? Yes  SHORT TERM GOALS: Target date: 02/07/2024     Patient will tolerate wearing  prosthetic for 4 hours/day without discomfort or skin irritation Baseline: 1hour (1/23); 2 hours (2/27) Goal status: IN PROGRESS  2.  Patient will demonstrate proper donning and doffing of the prosthetic independently without any assistance. Baseline: SBA, needs cueing (1/23) Goal status: MET  3.  Patient will be be able to WB on prosthetic for total of 5 min/day to improve functional use. Baseline: unable to stand due to pain in R LE (non amputated Extremity) (1/23) Goal status: IN PROGRESS   4.  Patient will be able to perform stand to sit with max A x 1 with use of RW and be able to perform stand pivot transfers.  Baseline: lateral scoot from chair to bed (SBA) (1/23); sit to stands w/min A  Goal status: IN PROGRESS    LONG TERM GOALS: Target date: 03/06/2024   Patient will be able to ambulate 20 feet with RW and CGA to improve functional ambulation Baseline: hasn't ambulated since 2018 (1/23) Goal status: INITIAL  2.  Pt will be able to perform stand transer with walker with SBA only Baseline: lateral scoot with SBA (1/23) Goal status: INITIAL  3.  Patient will demo 15% improvement on LEFS to improve functional ability  Baseline: 8/80 (1/23) Goal status: INITIAL  4.  Patient will be able to stand for at least 15 min per day in total with use of RW or appropriate AD to improve functional WB Baseline: unable to stand (1/23) Goal status: INITIAL    PLAN:  PT FREQUENCY: 2x/week  PT DURATION: 8 weeks  PLANNED INTERVENTIONS: 97164- PT Re-evaluation, 97110-Therapeutic exercises, 97530- Therapeutic activity, 97112- Neuromuscular re-education, 97535- Self Care, 09811- Manual therapy, [phone]- Gait training, [phone]- Prosthetic training, Patient/Family education, Balance training, Stair training, Dry Needling, Joint mobilization, Scar mobilization, DME instructions, Wheelchair mobility training, Cryotherapy, and Moist heat  PLAN FOR NEXT SESSION:. 10th visit PN. Stand pivot. Pt is  +2 asssist currently due to patella tendon rupture in bilateral knees. Start w/scifit for improved knee extension ROM. Gait Training w/WC follow    Jill Alexanders Pryor Guettler, PT, DPT Peter Congo, PT, DPT, CSRS  02/14/2024, 11:08 AM

## 2024-02-14 NOTE — Addendum Note (Signed)
 Addended by: Josephine Igo on: 02/14/2024 12:17 PM   Modules accepted: Orders

## 2024-02-18 ENCOUNTER — Ambulatory Visit (INDEPENDENT_AMBULATORY_CARE_PROVIDER_SITE_OTHER): Payer: 59 | Admitting: Orthopedic Surgery

## 2024-02-18 DIAGNOSIS — Z89512 Acquired absence of left leg below knee: Secondary | ICD-10-CM | POA: Diagnosis not present

## 2024-02-18 DIAGNOSIS — M25571 Pain in right ankle and joints of right foot: Secondary | ICD-10-CM

## 2024-02-19 ENCOUNTER — Telehealth: Payer: Self-pay | Admitting: Orthopedic Surgery

## 2024-02-19 ENCOUNTER — Other Ambulatory Visit: Payer: Self-pay | Admitting: Orthopedic Surgery

## 2024-02-19 ENCOUNTER — Ambulatory Visit: Payer: 59 | Attending: Orthopedic Surgery | Admitting: Physical Therapy

## 2024-02-19 DIAGNOSIS — G8929 Other chronic pain: Secondary | ICD-10-CM | POA: Insufficient documentation

## 2024-02-19 DIAGNOSIS — R2681 Unsteadiness on feet: Secondary | ICD-10-CM | POA: Insufficient documentation

## 2024-02-19 DIAGNOSIS — R2689 Other abnormalities of gait and mobility: Secondary | ICD-10-CM | POA: Insufficient documentation

## 2024-02-19 DIAGNOSIS — M25561 Pain in right knee: Secondary | ICD-10-CM | POA: Insufficient documentation

## 2024-02-19 DIAGNOSIS — M6281 Muscle weakness (generalized): Secondary | ICD-10-CM | POA: Diagnosis not present

## 2024-02-19 MED ORDER — METHOCARBAMOL 500 MG PO TABS: 500.0000 mg | ORAL_TABLET | Freq: Three times a day (TID) | ORAL | 0 refills | Status: DC | PRN

## 2024-02-19 NOTE — Telephone Encounter (Signed)
 Pt's went to therapy today and requesting a muscle relaxer. Please send medication to pharmacy on file. Pt phone number is [phone]. Please cal pt to confirm pharmacy

## 2024-02-19 NOTE — Telephone Encounter (Signed)
 I called and notified pt of rx

## 2024-02-19 NOTE — Therapy (Signed)
 OUTPATIENT PHYSICAL THERAPY PROSTHETICS TREATMENT- 10TH VISIT PROGRESS NOTE   Patient Name: Katrina Wolfe MRN: [id number] DOB:Sep 04, 1966, 58 y.o., female Today's Date: 02/19/2024  PCP: Dr. Tyson Dense REFERRING PROVIDER: Nadara Mustard, MD  Physical Therapy Progress Note   Dates of Reporting Period:01/10/24 - 02/19/24  See Note below for Objective Data and Assessment of Progress/Goals.    END OF SESSION:  PT End of Session - 02/19/24 0937     Visit Number 10    Number of Visits 13    Date for PT Re-Evaluation 03/06/24    Authorization Type [employer] Medicare/ [employer]    PT Start Time [phone]   Pt arrived late   PT Stop Time 1005    PT Time Calculation (min) 29 min    Equipment Utilized During Treatment Gait belt;Other (comment)   L BKA prosthetic   Activity Tolerance Patient limited by pain    Behavior During Therapy WFL for tasks assessed/performed                Past Medical History:  Diagnosis Date   Abnormal uterine bleeding (AUB)    Arthritis    knees, fingers   Asthma    as child, no problem since 1995   Bell's palsy 2004   CKD (chronic kidney disease), stage II    Complication of anesthesia    Congenital cardiomegaly    per pt has always been told since childhood and siblings    Diabetes mellitus without complication (HCC)    Dysrhythmia    Occasionally skipped beats   Elevated liver function tests    GERD (gastroesophageal reflux disease)    History of genital warts    HIV (human immunodeficiency virus infection) (HCC)    DX 1998--  MONITORED BY INFECTIOUS DISEASE-- DR Judyann Munson   Hypercholesterolemia    Hypertension    Intermittent palpitations    mild-- no meds   Legally blind in left eye, as defined in [place]    trauma as child   Peripheral vascular disease (HCC)    Pneumonia    PONV (postoperative nausea and vomiting)    Renal disorder    Tuberculosis    ? 2005 or 2007   Type 2 diabetes mellitus (HCC)    Type II    Uterine  fibroid    VIN III (vulvar intraepithelial neoplasia III)    and Verrucoid lesion on the mons   Wears glasses    Past Surgical History:  Procedure Laterality Date   AMPUTATION Left 06/27/2023   Procedure: LEFT BELOW KNEE AMPUTATION;  Surgeon: Nadara Mustard, MD;  Location: [facility] OR;  Service: Orthopedics;  Laterality: Left;   ANKLE FUSION Left 02/21/2023   Procedure: REVISION LEFT ANKLE FUSION;  Surgeon: Nadara Mustard, MD;  Location: [facility] OR;  Service: Orthopedics;  Laterality: Left;   CESAREAN SECTION  2001  &  1984   CO2 LASER APPLICATION N/A 11/26/2015   Procedure: CO2 LASER APPLICATION AND WIDE VOCAL EXCSION OF LESION ON MONS PUBIS;  Surgeon: De Blanch, MD;  Location: [facility];  Service: Gynecology;  Laterality: N/A;   CASE CANCELLED    CO2 LASER APPLICATION N/A 01/07/2016   Procedure: CO2 LASER OF VULVAR;  Surgeon: De Blanch, MD;  Location: [facility];  Service: Gynecology;  Laterality: N/A;   CORNEAL TRANSPLANT Left 12/18/2004   failed   D & C HYSTEROSCOPY /  RESECTION FIBROID  12/18/2002   DILATATION & CURETTAGE/HYSTEROSCOPY WITH MYOSURE N/A  10/01/2019   Procedure: DILATATION & CURETTAGE/HYSTEROSCOPY WITH MYOSURE and HYDROTHERMAL ABLATION;  Surgeon: Geryl Rankins, MD;  Location: [facility];  Service: Gynecology;  Laterality: N/A;  HTA rep will be here. Confirmed on 09/25/19 CS   EYE SURGERY     FOOT ARTHRODESIS Left 11/30/2021   Procedure: LEFT TIBIOCALCANEAL FUSION;  Surgeon: Nadara Mustard, MD;  Location: [facility] OR;  Service: Orthopedics;  Laterality: Left;   HARDWARE REMOVAL Left 04/04/2019   Procedure: EXCISION BONE AND REMOVE DEEP HARDWARE LEFT 5TH METATARSAL;  Surgeon: Nadara Mustard, MD;  Location: [facility] OR;  Service: Orthopedics;  Laterality: Left;   HARDWARE REMOVAL Left 08/04/2022   Procedure: REMOVAL DEEP HARDWARE LEFT ANKLE;  Surgeon: Nadara Mustard, MD;  Location: [facility] OR;  Service: Orthopedics;  Laterality:  Left;   I & D EXTREMITY Left 06/27/2021   Procedure: IRRIGATION AND DEBRIDEMENT FOOT ULCER;  Surgeon: Kerrin Champagne, MD;  Location: [facility] OR;  Service: Orthopedics;  Laterality: Left;   KNEE ARTHROSCOPY W/ ACL RECONSTRUCTION Bilateral 12/18/2006   ORIF TOE FRACTURE Left 08/02/2017   Procedure: OPEN REDUCTION INTERNAL FIXATION (ORIF) FIFTH METATARSAL (TOE) BASE FRACTURE;  Surgeon: Toni Arthurs, MD;  Location: [facility];  Service: Orthopedics;  Laterality: Left;   VULVECTOMY N/A 01/07/2016   Procedure: WIDE LOCAL EXCISION;  Surgeon: De Blanch, MD;  Location: [facility];  Service: Gynecology;  Laterality: N/A;  mons pubis as site   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, with necrosis of bone (HCC)    Diabetes mellitus type 2, uncontrolled, with complications 06/26/2021   Diabetic nephropathy (HCC) 06/26/2021   CKD (chronic kidney disease), stage III (HCC) 06/26/2021   HTN (hypertension) 06/24/2021   HIV (human immunodeficiency virus infection) (HCC) 06/24/2021   DM2 (diabetes mellitus, type 2) (HCC) 06/24/2021   HLD (hyperlipidemia) 06/24/2021   Osteomyelitis of left foot (HCC) 06/24/2021   Elevated LFTs 06/24/2021   Acute-on-chronic kidney injury (HCC) 06/24/2021   Osteomyelitis of foot (HCC) 04/04/2019   Hardware complicating wound infection (HCC)    Subacute osteomyelitis, left ankle and foot (HCC)    Open displaced fracture of fifth metatarsal bone of left foot    Healthcare maintenance 04/03/2019   HIV disease (HCC) 10/27/2015   DM type 2 (diabetes mellitus, type 2) (HCC) 10/27/2015   Hyperlipidemia 10/27/2015   Essential hypertension 10/27/2015   Severe  vulvar dysplasia, histologically confirmed 10/15/2015    ONSET DATE: 12/17/2023  REFERRING DIAG: F62.130 (ICD-10-CM) - S/P BKA (below knee amputation) unilateral, left (HCC)  THERAPY DIAG:  Unsteadiness on feet  Muscle weakness (generalized)  Chronic pain of right knee  Other abnormalities of gait and mobility  Rationale for Evaluation and Treatment: Rehabilitation  SUBJECTIVE:   SUBJECTIVE STATEMENT: Patient presents in WC. States her R hip is still a bit painful but better than last week. Up to wearing the LLE for 2 hours per day. No falls. Saw Dr. Lajoyce Corners yesterday and is getting an orthotic for her R foot.   Pt accompanied by: self   PERTINENT HISTORY: HIV, PMH includes: arthritis, asthma, Bell's palsy, CKD II, DM II, HIV, HTN, PVD, blind in L eye, and multiple previous L ankle surgeries, prior  R TKA  PAIN:  Are you having pain? Yes: NPRS scale: 4/10 in R hip  Pain location: Lateral residual leg, pt also reports of phantom pain at night that wakes her up. Pain description: neuropathic (burning), phantom Aggravating factors: none Relieving factors: Tramadol  PRECAUTIONS: Fall and Other: HIV  RED FLAGS: None   WEIGHT BEARING RESTRICTIONS: No  FALLS: Has patient fallen in last 6 months? No  LIVING ENVIRONMENT: Lives with: lives with their family and both sons, daughter in Social worker, grandkids Lives in: House/apartment Home Access: Stairs to enter and Ramped entrance Home layout: Two level Stairs:  has stairs but also has ramp entry. Has following equipment at home: Dan Humphreys - 4 wheeled and shower chair  PLOF: Requires assistive device for independence and Needs assistance with gait  PATIENT GOALS: to be able to walk better (pt hasn't walked well since 2018)  OBJECTIVE:  Note: Objective measures were completed at Evaluation unless otherwise noted.  COGNITION: Overall cognitive status: Within functional limits for tasks assessed   LOWER EXTREMITY ROM:  Active ROM  Right eval Left eval  Hip flexion    Hip extension    Hip abduction    Hip adduction    Hip internal rotation    Hip external rotation    Knee flexion    Knee extension    Ankle dorsiflexion    Ankle plantarflexion    Ankle inversion    Ankle eversion     (Blank rows = not tested)  LOWER EXTREMITY MMT:  MMT Right eval Left eval  Hip flexion    Hip extension    Hip abduction    Hip adduction    Hip internal rotation    Hip external rotation    Knee flexion    Knee extension    Ankle dorsiflexion    Ankle plantarflexion    Ankle inversion    Ankle eversion    (Blank rows = not tested)  TRANSFERS: Sit to stand: Total A Stand to sit: Total A Lateral scoot transfers: SBA  GAIT: UNABLE  FUNCTIONAL TESTS: Unable to perform any functional tests as patient is non ambulatory at this time    CURRENT PROSTHETIC WEAR ASSESSMENT: Patient is independent with: skin check, residual limb care, prosthetic cleaning, and ply sock cleaning Patient is dependent with: residual limb care, correct ply sock adjustment, proper wear schedule/adjustment, and proper weight-bearing schedule/adjustment Donning prosthesis: SBA Doffing prosthesis: SBA Prosthetic wear tolerance: 1 hours/day, 7 days/week Prosthetic weight bearing tolerance: 0 minutes Edema: present Residual limb condition: dry scabs present, well healed   PATIENT SURVEYS:  LEFS 8/80                                                                                                                            TREATMENT DATE:  Self-Care/ther act: Pt donned L BKA prosthetic mod I  SciFit multi-peaks level 8 for 8 minutes using BUE/BLEs for neural priming for reciprocal movement, dynamic cardiovascular warmup  and increased amplitude of stepping. Pt reporting no change in R hip pain following activity.  Seated resisted hip abduction w/green theraband, x10 reps, for improved functional hip strength and pain modulation. Pt reported  feeling a "pinching" in R hip w/movement w/increased pain levels, so discontinued movement  Pt extremely TTP along VML and unable to tolerate stretching in any plane or light touch.  Recommended pt call Dr. Lajoyce Corners and request muscle relaxant for R quad as she is unable to tolerate standing, prone lying, side lying or hip flexion of RLE. Pt verbalized understanding and reported she would call Dr. Lajoyce Corners today. Informed pt that we can pause PT until pain is more managed due to limited PT visits and poor response to heat, modalities and stretching for pain management.     PATIENT EDUCATION: Education details:  Continue HEP, calling Dr. Lajoyce Corners, next appointment time and date, increase wear schedule w/prosthetic to 3 hours/day  Person educated: Patient Education method: Explanation Education comprehension: verbalized understanding and needs further education  HOME EXERCISE PROGRAM: Access Code: [email or web address] URL: [email or web address] Date: 01/17/2024 Prepared by: Alethia Berthold Zoella Roberti  Exercises - Single Leg Bridge  - 1 x daily - 7 x weekly - 3 sets - 10 reps - Clamshell  - 1 x daily - 7 x weekly - 3 sets - 10 reps - Seated knee extension   - 1 x daily - 7 x weekly - 3 sets - 10 reps - Beginner Prone Single Leg Raise  - 1 x daily - 7 x weekly - 1-2 sets - 10-15 reps  ASSESSMENT:  CLINICAL IMPRESSION: Session limited due to continued pain levels in R hip/quad. Pt reports TPDN helped "some", but her pain continues to be debilitating and she forgot to tell Dr. Lajoyce Corners about it yesterday. Pt gets no pain relief w/stretching, heat, massage or modalities, so recommend pt call Dr. Lajoyce Corners and inquire about muscle relaxants as her pain is limiting her ability to stand and perform transfers. Without pain, pt is able to perform sit to stands w/min-mod A and ambulated 11' w/RW and min A. Pt continues to progress towards STG and LTGs, pain limiting. Continue POC.   OBJECTIVE IMPAIRMENTS: Abnormal gait, decreased  activity tolerance, decreased balance, decreased endurance, decreased mobility, difficulty walking, decreased strength, hypomobility, increased edema, increased muscle spasms, impaired flexibility, improper body mechanics, postural dysfunction, prosthetic dependency , and pain.   ACTIVITY LIMITATIONS: carrying, lifting, bending, standing, squatting, stairs, transfers, and locomotion level  PARTICIPATION LIMITATIONS: meal prep, cleaning, laundry, driving, shopping, community activity, and yard work  PERSONAL FACTORS: Time since onset of injury/illness/exacerbation are also affecting patient's functional outcome.   REHAB POTENTIAL: Good  CLINICAL DECISION MAKING: Stable/uncomplicated  EVALUATION COMPLEXITY: Low   GOALS: Goals reviewed with patient? Yes  SHORT TERM GOALS: Target date: 02/07/2024     Patient will tolerate wearing prosthetic for 4 hours/day without discomfort or skin irritation Baseline: 1hour (1/23); 2 hours (2/27) Goal status: IN PROGRESS  2.  Patient will demonstrate proper donning and doffing of the prosthetic independently without any assistance. Baseline: SBA, needs cueing (1/23) Goal status: MET  3.  Patient will be be able to WB on prosthetic for total of 5 min/day to improve functional use. Baseline: unable to stand due to pain in R LE (non amputated Extremity) (1/23) Goal status: IN PROGRESS   4.  Patient will be able to perform stand to sit with max A x 1 with use of RW and be able to perform stand pivot transfers.  Baseline: lateral scoot from chair to bed (SBA) (1/23); sit to stands w/min A  Goal status: IN PROGRESS    LONG TERM GOALS: Target date: 03/06/2024   Patient will be able to ambulate 20 feet with RW and CGA to improve functional ambulation Baseline: hasn't ambulated since 2018 (1/23) Goal status: INITIAL  2.  Pt will be able to perform stand transer with walker with SBA only Baseline: lateral scoot with SBA (1/23) Goal status:  INITIAL  3.  Patient will demo 15% improvement on LEFS to improve functional ability Baseline: 8/80 (1/23) Goal status: INITIAL  4.  Patient will be able to stand for at least 15 min per day in total with use of RW or appropriate AD to improve functional WB Baseline: unable to stand (1/23) Goal status: INITIAL    PLAN:  PT FREQUENCY: 2x/week  PT DURATION: 8 weeks  PLANNED INTERVENTIONS: 97164- PT Re-evaluation, 97110-Therapeutic exercises, 97530- Therapeutic activity, 97112- Neuromuscular re-education, 97535- Self Care, 11914- Manual therapy, [phone]- Gait training, [phone]- Prosthetic training, Patient/Family education, Balance training, Stair training, Dry Needling, Joint mobilization, Scar mobilization, DME instructions, Wheelchair mobility training, Cryotherapy, and Moist heat  PLAN FOR NEXT SESSION:.Did she call Dr. Lajoyce Corners? Stand pivot. Pt is +2 asssist currently due to patella tendon rupture in bilateral knees. Start w/scifit for improved knee extension ROM. Gait Training w/WC follow    Jill Alexanders Jalaiyah Throgmorton, PT, DPT  02/19/2024, 10:05 AM

## 2024-02-19 NOTE — Telephone Encounter (Signed)
 Pt was in the office yesterday for eval. Went to therapy today and in the notes it was documented that the therapist advised the pt to call for a muscle relaxer due to inability to participate in therapy please advise.

## 2024-02-21 ENCOUNTER — Ambulatory Visit: Payer: 59 | Admitting: Physical Therapy

## 2024-02-25 ENCOUNTER — Encounter: Payer: Self-pay | Admitting: Orthopedic Surgery

## 2024-02-25 ENCOUNTER — Other Ambulatory Visit: Payer: Self-pay | Admitting: Orthopedic Surgery

## 2024-02-25 NOTE — Progress Notes (Signed)
 Office Visit Note   Patient: Katrina Wolfe           Date of Birth: Aug 14, 1966           MRN: [id number] Visit Date: 02/18/2024              Requested by: Georgann Housekeeper, MD [address] [place],  [place] [address] PCP: Georgann Housekeeper, MD  Chief Complaint  Patient presents with   Left Leg - Follow-up    Hx left BKA      HPI: Continue current gait training for the amputation. Patient is a 58 year old woman who presents for 2 separate issues.  She is status post left transtibial amputation currently and gait training.  She complains of instability with inversion instability of the right foot and ankle.  Assessment & Plan: Visit Diagnoses:  1. S/P BKA (below knee amputation) unilateral, left (HCC)   2. Pain in right ankle and joints of right foot     Plan: Prescription provided for custom orthotics with lateral posting with [employer].  Follow-Up Instructions: No follow-ups on file.   Ortho Exam  Patient is alert, oriented, no adenopathy, well-dressed, normal affect, normal respiratory effort. Examination patient has a well-healed left transtibial amputation.  Examination of right foot patient has varus instability.  Her foot can correct to neutral.  There are no ulcers no cellulitis.  Imaging: No results found. No images are attached to the encounter.  Labs: Lab Results  Component Value Date   HGBA1C 7.7 (H) 06/27/2023   HGBA1C 8.5 (H) 02/21/2023   HGBA1C 8.2 (H) 11/30/2021   ESRSEDRATE 53 (H) 08/15/2021   ESRSEDRATE 74 (H) 06/30/2021   CRP 18.5 (H) 08/15/2021   CRP 0.9 06/30/2021   REPTSTATUS 07/03/2021 FINAL 06/27/2021   GRAMSTAIN  06/27/2021    RARE WBC PRESENT,BOTH PMN AND MONONUCLEAR NO ORGANISMS SEEN    CULT  06/27/2021    RARE CORYNEBACTERIUM SPECIES Standardized susceptibility testing for this organism is not available. NO ANAEROBES ISOLATED Performed at [facility], [address] [place], [place] [address]    LABORGA  PSEUDOMONAS AERUGINOSA 04/04/2019   LABORGA SERRATIA MARCESCENS 04/04/2019   LABORGA STAPHYLOCOCCUS AUREUS 04/04/2019     Lab Results  Component Value Date   ALBUMIN 3.4 (L) 06/27/2023   ALBUMIN 3.5 08/04/2022   ALBUMIN 2.8 (L) 07/02/2021   PREALBUMIN 32 06/27/2023    Lab Results  Component Value Date   MG 1.6 (L) 07/04/2021   MG 1.5 (L) 07/03/2021   MG 1.6 (L) 07/02/2021   No results found for: "VD25OH"  Lab Results  Component Value Date   PREALBUMIN 32 06/27/2023      Latest Ref Rng & Units 06/29/2023    1:50 AM 06/28/2023    3:35 AM 06/27/2023   11:54 AM  CBC EXTENDED  WBC 4.0 - 10.5 K/uL 14.8  13.9  9.5   RBC 3.87 - 5.11 MIL/uL 3.87  3.96  4.71   Hemoglobin 12.0 - 15.0 g/dL 82.9  56.2  13.0   HCT 36.0 - 46.0 % 37.4  37.7  45.5   Platelets 150 - 400 K/uL 311  331  322   NEUT# 1.7 - 7.7 K/uL   6.3   Lymph# 0.7 - 4.0 K/uL   2.3      There is no height or weight on file to calculate BMI.  Orders:  No orders of the defined types were placed in this encounter.  No orders of the  defined types were placed in this encounter.    Procedures: No procedures performed  Clinical Data: No additional findings.  ROS:  All other systems negative, except as noted in the HPI. Review of Systems  Objective: Vital Signs: LMP 04/27/2021   Specialty Comments:  No specialty comments available.  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, with necrosis of bone (HCC)    Diabetes mellitus type 2, uncontrolled, with complications 06/26/2021   Diabetic nephropathy (HCC) 06/26/2021   CKD (chronic kidney disease), stage III (HCC) 06/26/2021   HTN (hypertension) 06/24/2021   HIV  (human immunodeficiency virus infection) (HCC) 06/24/2021   DM2 (diabetes mellitus, type 2) (HCC) 06/24/2021   HLD (hyperlipidemia) 06/24/2021   Osteomyelitis of left foot (HCC) 06/24/2021   Elevated LFTs 06/24/2021   Acute-on-chronic kidney injury (HCC) 06/24/2021   Osteomyelitis of foot (HCC) 04/04/2019   Hardware complicating wound infection (HCC)    Subacute osteomyelitis, left ankle and foot (HCC)    Open displaced fracture of fifth metatarsal bone of left foot    Healthcare maintenance 04/03/2019   HIV disease (HCC) 10/27/2015   DM type 2 (diabetes mellitus, type 2) (HCC) 10/27/2015   Hyperlipidemia 10/27/2015   Essential hypertension 10/27/2015   Severe vulvar dysplasia, histologically confirmed 10/15/2015   Past Medical History:  Diagnosis Date   Abnormal uterine bleeding (AUB)    Arthritis    knees, fingers   Asthma    as child, no problem since 1995   Bell's palsy 2004   CKD (chronic kidney disease), stage II    Complication of anesthesia    Congenital cardiomegaly    per pt has always been told since childhood and siblings    Diabetes mellitus without complication (HCC)    Dysrhythmia    Occasionally skipped beats   Elevated liver function tests    GERD (gastroesophageal reflux disease)    History of genital warts    HIV (human immunodeficiency virus infection) (HCC)    DX 1998--  MONITORED BY INFECTIOUS DISEASE-- DR Judyann Munson   Hypercholesterolemia    Hypertension    Intermittent palpitations    mild-- no meds   Legally blind in left eye, as defined in [place]    trauma as child   Peripheral vascular disease (HCC)    Pneumonia    PONV (postoperative nausea and vomiting)    Renal disorder    Tuberculosis    ? 2005 or 2007   Type 2 diabetes mellitus (HCC)    Type II    Uterine fibroid    VIN III (vulvar intraepithelial neoplasia III)    and Verrucoid lesion on the mons   Wears glasses     Family History  Problem Relation Age of Onset   Hypertension  Mother    Diabetes Father    Cancer Brother    Breast cancer Cousin     Past Surgical History:  Procedure Laterality Date   AMPUTATION Left 06/27/2023   Procedure: LEFT BELOW KNEE AMPUTATION;  Surgeon: Nadara Mustard, MD;  Location: [facility] OR;  Service: Orthopedics;  Laterality: Left;   ANKLE FUSION Left 02/21/2023   Procedure: REVISION LEFT ANKLE FUSION;  Surgeon: Nadara Mustard, MD;  Location: [facility] OR;  Service: Orthopedics;  Laterality: Left;   CESAREAN SECTION  2001  &  1984   CO2 LASER APPLICATION N/A 11/26/2015   Procedure: CO2 LASER APPLICATION AND WIDE VOCAL EXCSION OF LESION ON MONS PUBIS;  Surgeon: De Blanch, MD;  Location: [facility];  Service: Gynecology;  Laterality: N/A;   CASE CANCELLED    CO2 LASER APPLICATION N/A 01/07/2016   Procedure: CO2 LASER OF VULVAR;  Surgeon: De Blanch, MD;  Location: [facility];  Service: Gynecology;  Laterality: N/A;   CORNEAL TRANSPLANT Left 12/18/2004   failed   D & C HYSTEROSCOPY /  RESECTION FIBROID  12/18/2002   DILATATION & CURETTAGE/HYSTEROSCOPY WITH MYOSURE N/A 10/01/2019   Procedure: DILATATION & CURETTAGE/HYSTEROSCOPY WITH MYOSURE and HYDROTHERMAL ABLATION;  Surgeon: Geryl Rankins, MD;  Location: [facility];  Service: Gynecology;  Laterality: N/A;  HTA rep will be here. Confirmed on 09/25/19 CS   EYE SURGERY     FOOT ARTHRODESIS Left 11/30/2021   Procedure: LEFT TIBIOCALCANEAL FUSION;  Surgeon: Nadara Mustard, MD;  Location: [facility] OR;  Service: Orthopedics;  Laterality: Left;   HARDWARE REMOVAL Left 04/04/2019   Procedure: EXCISION BONE AND REMOVE DEEP HARDWARE LEFT 5TH METATARSAL;  Surgeon: Nadara Mustard, MD;  Location[facility];  Service: Orthopedics;  Laterality: Left;   HARDWARE REMOVAL Left 08/04/2022   Procedure: REMOVAL DEEP HARDWARE LEFT ANKLE;  Surgeon: Nadara Mustard, MD;  Location: [facility] OR;  Service: Orthopedics;  Laterality: Left;   I & D EXTREMITY Left 06/27/2021    Procedure: IRRIGATION AND DEBRIDEMENT FOOT ULCER;  Surgeon: Kerrin Champagne, MD;  Location: [facility];  Service: Orthopedics;  Laterality: Left;   KNEE ARTHROSCOPY W/ ACL RECONSTRUCTION Bilateral 12/18/2006   ORIF TOE FRACTURE Left 08/02/2017   Procedure: OPEN REDUCTION INTERNAL FIXATION (ORIF) FIFTH METATARSAL (TOE) BASE FRACTURE;  Surgeon: Toni Arthurs, MD;  Location: [facility];  Service: Orthopedics;  Laterality: Left;   VULVECTOMY N/A 01/07/2016   Procedure: WIDE LOCAL EXCISION;  Surgeon: De Blanch, MD;  Location: [facility];  Service: Gynecology;  Laterality: N/A;  mons pubis as site   Social History   Occupational History   Not on file  Tobacco Use   Smoking status: Never   Smokeless tobacco: Never  Vaping Use   Vaping status: Never Used  Substance and Sexual Activity   Alcohol use: No    Alcohol/week: 0.0 standard drinks of alcohol   Drug use: No   Sexual activity: Yes    Partners: Male    Birth control/protection: Condom, Post-menopausal

## 2024-02-26 ENCOUNTER — Ambulatory Visit: Payer: Self-pay | Admitting: Physical Therapy

## 2024-02-26 DIAGNOSIS — R2681 Unsteadiness on feet: Secondary | ICD-10-CM | POA: Diagnosis not present

## 2024-02-26 DIAGNOSIS — R2689 Other abnormalities of gait and mobility: Secondary | ICD-10-CM | POA: Diagnosis not present

## 2024-02-26 DIAGNOSIS — M6281 Muscle weakness (generalized): Secondary | ICD-10-CM

## 2024-02-26 DIAGNOSIS — G8929 Other chronic pain: Secondary | ICD-10-CM

## 2024-02-26 DIAGNOSIS — M25561 Pain in right knee: Secondary | ICD-10-CM | POA: Diagnosis not present

## 2024-02-26 NOTE — Therapy (Signed)
 OUTPATIENT PHYSICAL THERAPY PROSTHETICS TREATMENT   Patient Name: Breely Panik MRN: [id number] DOB:1966-10-10, 58 y.o., female Today's Date: 02/26/2024  PCP: Dr. Tyson Dense REFERRING PROVIDER: Nadara Mustard, MD    END OF SESSION:  PT End of Session - 02/26/24 0848     Visit Number 11    Number of Visits 13    Date for PT Re-Evaluation 03/06/24    Authorization Type [employer] Medicare/ [place] Medicaid    PT Start Time [phone]    PT Stop Time 0914    PT Time Calculation (min) 27 min    Equipment Utilized During Treatment Gait belt;Other (comment)   L BKA prosthetic   Activity Tolerance Patient limited by pain    Behavior During Therapy WFL for tasks assessed/performed                Past Medical History:  Diagnosis Date   Abnormal uterine bleeding (AUB)    Arthritis    knees, fingers   Asthma    as child, no problem since 1995   Bell's palsy 2004   CKD (chronic kidney disease), stage II    Complication of anesthesia    Congenital cardiomegaly    per pt has always been told since childhood and siblings    Diabetes mellitus without complication (HCC)    Dysrhythmia    Occasionally skipped beats   Elevated liver function tests    GERD (gastroesophageal reflux disease)    History of genital warts    HIV (human immunodeficiency virus infection) (HCC)    DX 1998--  MONITORED BY INFECTIOUS DISEASE-- DR Judyann Munson   Hypercholesterolemia    Hypertension    Intermittent palpitations    mild-- no meds   Legally blind in left eye, as defined in [place]    trauma as child   Peripheral vascular disease (HCC)    Pneumonia    PONV (postoperative nausea and vomiting)    Renal disorder    Tuberculosis    ? 2005 or 2007   Type 2 diabetes mellitus (HCC)    Type II    Uterine fibroid    VIN III (vulvar intraepithelial neoplasia III)    and Verrucoid lesion on the mons   Wears glasses    Past Surgical History:  Procedure Laterality Date   AMPUTATION Left  06/27/2023   Procedure: LEFT BELOW KNEE AMPUTATION;  Surgeon: Nadara Mustard, MD;  Location: [facility] OR;  Service: Orthopedics;  Laterality: Left;   ANKLE FUSION Left 02/21/2023   Procedure: REVISION LEFT ANKLE FUSION;  Surgeon: Nadara Mustard, MD;  Location: [facility] OR;  Service: Orthopedics;  Laterality: Left;   CESAREAN SECTION  2001  &  1984   CO2 LASER APPLICATION N/A 11/26/2015   Procedure: CO2 LASER APPLICATION AND WIDE VOCAL EXCSION OF LESION ON MONS PUBIS;  Surgeon: De Blanch, MD;  Location: [facility];  Service: Gynecology;  Laterality: N/A;   CASE CANCELLED    CO2 LASER APPLICATION N/A 01/07/2016   Procedure: CO2 LASER OF VULVAR;  Surgeon: De Blanch, MD;  Location: [facility];  Service: Gynecology;  Laterality: N/A;   CORNEAL TRANSPLANT Left 12/18/2004   failed   D & C HYSTEROSCOPY /  RESECTION FIBROID  12/18/2002   DILATATION & CURETTAGE/HYSTEROSCOPY WITH MYOSURE N/A 10/01/2019   Procedure: DILATATION & CURETTAGE/HYSTEROSCOPY WITH MYOSURE and HYDROTHERMAL ABLATION;  Surgeon: Geryl Rankins, MD;  Location: [facility];  Service: Gynecology;  Laterality: N/A;  HTA rep  will be here. Confirmed on 09/25/19 CS   EYE SURGERY     FOOT ARTHRODESIS Left 11/30/2021   Procedure: LEFT TIBIOCALCANEAL FUSION;  Surgeon: Nadara Mustard, MD;  Location: [facility] OR;  Service: Orthopedics;  Laterality: Left;   HARDWARE REMOVAL Left 04/04/2019   Procedure: EXCISION BONE AND REMOVE DEEP HARDWARE LEFT 5TH METATARSAL;  Surgeon: Nadara Mustard, MD;  Location: [facility] OR;  Service: Orthopedics;  Laterality: Left;   HARDWARE REMOVAL Left 08/04/2022   Procedure: REMOVAL DEEP HARDWARE LEFT ANKLE;  Surgeon: Nadara Mustard, MD;  Location: [facility] OR;  Service: Orthopedics;  Laterality: Left;   I & D EXTREMITY Left 06/27/2021   Procedure: IRRIGATION AND DEBRIDEMENT FOOT ULCER;  Surgeon: Kerrin Champagne, MD;  Location: [facility] OR;  Service: Orthopedics;  Laterality: Left;   KNEE  ARTHROSCOPY W/ ACL RECONSTRUCTION Bilateral 12/18/2006   ORIF TOE FRACTURE Left 08/02/2017   Procedure: OPEN REDUCTION INTERNAL FIXATION (ORIF) FIFTH METATARSAL (TOE) BASE FRACTURE;  Surgeon: Toni Arthurs, MD;  Location:  SURGERY CENTER;  Service: Orthopedics;  Laterality: Left;   VULVECTOMY N/A 01/07/2016   Procedure: WIDE LOCAL EXCISION;  Surgeon: De Blanch, MD;  Location: [facility];  Service: Gynecology;  Laterality: N/A;  mons pubis as site   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, with necrosis of bone (HCC)    Diabetes mellitus type 2, uncontrolled, with complications 06/26/2021   Diabetic nephropathy (HCC) 06/26/2021   CKD (chronic kidney disease), stage III (HCC) 06/26/2021   HTN (hypertension) 06/24/2021   HIV (human immunodeficiency virus infection) (HCC) 06/24/2021   DM2 (diabetes mellitus, type 2) (HCC) 06/24/2021   HLD (hyperlipidemia) 06/24/2021   Osteomyelitis of left foot (HCC) 06/24/2021   Elevated LFTs 06/24/2021   Acute-on-chronic kidney injury (HCC) 06/24/2021   Osteomyelitis of foot (HCC) 04/04/2019   Hardware complicating wound infection (HCC)    Subacute osteomyelitis, left ankle and foot (HCC)    Open displaced fracture of fifth metatarsal bone of left foot    Healthcare maintenance 04/03/2019   HIV disease (HCC) 10/27/2015   DM type 2 (diabetes mellitus, type 2) (HCC) 10/27/2015   Hyperlipidemia 10/27/2015   Essential hypertension 10/27/2015   Severe vulvar dysplasia, histologically confirmed 10/15/2015    ONSET DATE: 12/17/2023  REFERRING DIAG: N82.956 (ICD-10-CM) - S/P BKA (below knee amputation) unilateral, left (HCC)  THERAPY DIAG:   Muscle weakness (generalized)  Unsteadiness on feet  Chronic pain of right knee  Other abnormalities of gait and mobility  Rationale for Evaluation and Treatment: Rehabilitation  SUBJECTIVE:   SUBJECTIVE STATEMENT: Patient presents in WC. States the prescription for a muscle relaxer was sent in last week but her pharmacy did not fill it. Called again yesterday and hopes to get it tomorrow. Denies falls. Wearing L prosthetic 3 hours/day   Pt accompanied by: self   PERTINENT HISTORY: HIV, PMH includes: arthritis, asthma, Bell's palsy, CKD II, DM II, HIV, HTN, PVD, blind in L eye, and multiple previous L ankle surgeries, prior R TKA  PAIN:  Are you having pain? Yes: NPRS scale: 3/10 in R hip  Pain location: Lateral residual leg, pt also reports of phantom pain at night that wakes her up. Pain description:  neuropathic (burning), phantom Aggravating factors: none Relieving factors: Tramadol  PRECAUTIONS: Fall and Other: HIV  RED FLAGS: None   WEIGHT BEARING RESTRICTIONS: No  FALLS: Has patient fallen in last 6 months? No  LIVING ENVIRONMENT: Lives with: lives with their family and both sons, daughter in Social worker, grandkids Lives in: House/apartment Home Access: Stairs to enter and Ramped entrance Home layout: Two level Stairs:  has stairs but also has ramp entry. Has following equipment at home: Dan Humphreys - 4 wheeled and shower chair  PLOF: Requires assistive device for independence and Needs assistance with gait  PATIENT GOALS: to be able to walk better (pt hasn't walked well since 2018)  OBJECTIVE:  Note: Objective measures were completed at Evaluation unless otherwise noted.  COGNITION: Overall cognitive status: Within functional limits for tasks assessed   LOWER EXTREMITY ROM:  Active ROM Right eval Left eval  Hip flexion    Hip extension    Hip abduction    Hip adduction    Hip internal rotation    Hip external rotation    Knee flexion    Knee extension     Ankle dorsiflexion    Ankle plantarflexion    Ankle inversion    Ankle eversion     (Blank rows = not tested)  LOWER EXTREMITY MMT:  MMT Right eval Left eval  Hip flexion    Hip extension    Hip abduction    Hip adduction    Hip internal rotation    Hip external rotation    Knee flexion    Knee extension    Ankle dorsiflexion    Ankle plantarflexion    Ankle inversion    Ankle eversion    (Blank rows = not tested)  TRANSFERS: Sit to stand: Total A Stand to sit: Total A Lateral scoot transfers: SBA  GAIT: UNABLE  FUNCTIONAL TESTS: Unable to perform any functional tests as patient is non ambulatory at this time    CURRENT PROSTHETIC WEAR ASSESSMENT: Patient is independent with: skin check, residual limb care, prosthetic cleaning, and ply sock cleaning Patient is dependent with: residual limb care, correct ply sock adjustment, proper wear schedule/adjustment, and proper weight-bearing schedule/adjustment Donning prosthesis: SBA Doffing prosthesis: SBA Prosthetic wear tolerance: 3 hours/day, 7 days/week Prosthetic weight bearing tolerance: 0 minutes Edema: present Residual limb condition: dry scabs present, well healed   PATIENT SURVEYS:  LEFS 8/80                                                                                                                            TREATMENT DATE:  Self-Care/ther act: Pt donned L BKA prosthetic and Icarus brace on RLE mod I  SciFit multi-peaks level 8 for 10 minutes using BUE/BLEs for neural priming for reciprocal movement, dynamic cardiovascular warmup and increased amplitude of stepping. No report of pain w/activity  Pt performed lateral scoot transfer from WC to mat on L side mod I From 61 cm height, pt performed  3 sit to stands from mat to RW w/min A and held for 10-20s for improved weight bearing tolerance, functional BLE strength and pre-gait training. Pt unable to tolerate weightbearing well due to R hip/knee pain and  noted significant spasticity in RLE at rest. Pt requesting to end session due to increase in RLE pain (5/10 in knee, 4/10 in proximal lateral quad).  Pt performed lateral transfer from mat to WC on R side mod I.    PATIENT EDUCATION: Education details:  Continue HEP, build up wear schedule to 3.5 hours/day  Person educated: Patient Education method: Explanation Education comprehension: verbalized understanding and needs further education  HOME EXERCISE PROGRAM: Access Code: [email or web address] URL: [email or web address] Date: 01/17/2024 Prepared by: Alethia Berthold Inmer Nix  Exercises - Single Leg Bridge  - 1 x daily - 7 x weekly - 3 sets - 10 reps - Clamshell  - 1 x daily - 7 x weekly - 3 sets - 10 reps - Seated knee extension   - 1 x daily - 7 x weekly - 3 sets - 10 reps - Beginner Prone Single Leg Raise  - 1 x daily - 7 x weekly - 1-2 sets - 10-15 reps  ASSESSMENT:  CLINICAL IMPRESSION: Session limited due to continued pain levels in R hip/quad/knee. Pt reports she has not obtained her muscle relaxer yet due to pharmacy issues but is hopeful to get it tomorrow. Pt's hip pain slightly more tolerable today but pt continues to demonstrate significant tone in RLE, limiting her ability to stand and walk. Pt is wearing L prosthetic 3 hours per day but has been unable to work on standing at home due to RLE pain. Continue POC.   OBJECTIVE IMPAIRMENTS: Abnormal gait, decreased activity tolerance, decreased balance, decreased endurance, decreased mobility, difficulty walking, decreased strength, hypomobility, increased edema, increased muscle spasms, impaired flexibility, improper body mechanics, postural dysfunction, prosthetic dependency , and pain.   ACTIVITY LIMITATIONS: carrying, lifting, bending, standing, squatting, stairs, transfers, and locomotion level  PARTICIPATION LIMITATIONS: meal prep, cleaning, laundry, driving, shopping, community activity, and yard work  PERSONAL FACTORS: Time  since onset of injury/illness/exacerbation are also affecting patient's functional outcome.   REHAB POTENTIAL: Good  CLINICAL DECISION MAKING: Stable/uncomplicated  EVALUATION COMPLEXITY: Low   GOALS: Goals reviewed with patient? Yes  SHORT TERM GOALS: Target date: 02/07/2024     Patient will tolerate wearing prosthetic for 4 hours/day without discomfort or skin irritation Baseline: 1hour (1/23); 2 hours (2/27) Goal status: IN PROGRESS  2.  Patient will demonstrate proper donning and doffing of the prosthetic independently without any assistance. Baseline: SBA, needs cueing (1/23) Goal status: MET  3.  Patient will be be able to WB on prosthetic for total of 5 min/day to improve functional use. Baseline: unable to stand due to pain in R LE (non amputated Extremity) (1/23) Goal status: IN PROGRESS   4.  Patient will be able to perform stand to sit with max A x 1 with use of RW and be able to perform stand pivot transfers.  Baseline: lateral scoot from chair to bed (SBA) (1/23); sit to stands w/min A  Goal status: IN PROGRESS    LONG TERM GOALS: Target date: 03/06/2024   Patient will be able to ambulate 20 feet with RW and CGA to improve functional ambulation Baseline: hasn't ambulated since 2018 (1/23) Goal status: INITIAL  2.  Pt will be able to perform stand transer with walker with SBA only Baseline: lateral scoot with SBA (1/23) Goal status: INITIAL  3.  Patient will demo 15% improvement on LEFS to improve functional ability Baseline: 8/80 (1/23) Goal status: INITIAL  4.  Patient will be able to stand for at least 15 min per day in total with use of RW or appropriate AD to improve functional WB Baseline: unable to stand (1/23) Goal status: INITIAL    PLAN:  PT FREQUENCY: 2x/week  PT DURATION: 8 weeks  PLANNED INTERVENTIONS: [id number]- PT Re-evaluation, 97110-Therapeutic exercises, 97530- Therapeutic activity, 97112- Neuromuscular re-education, 97535- Self  Care, 54098- Manual therapy, [phone]- Gait training, [phone]- Prosthetic training, Patient/Family education, Balance training, Stair training, Dry Needling, Joint mobilization, Scar mobilization, DME instructions, Wheelchair mobility training, Cryotherapy, and Moist heat  PLAN FOR NEXT SESSION:.Did she get prescription? Stand pivot. Pt is +2 asssist currently due to patella tendon rupture in bilateral knees. Start w/scifit for improved knee extension ROM. Gait Training w/WC follow    Jill Alexanders Kayon Dozier, PT, DPT  02/26/2024, 9:19 AM

## 2024-02-29 ENCOUNTER — Ambulatory Visit: Payer: Self-pay | Admitting: Physical Therapy

## 2024-02-29 DIAGNOSIS — R2689 Other abnormalities of gait and mobility: Secondary | ICD-10-CM

## 2024-02-29 DIAGNOSIS — R2681 Unsteadiness on feet: Secondary | ICD-10-CM

## 2024-02-29 DIAGNOSIS — G8929 Other chronic pain: Secondary | ICD-10-CM

## 2024-02-29 DIAGNOSIS — M6281 Muscle weakness (generalized): Secondary | ICD-10-CM | POA: Diagnosis not present

## 2024-02-29 DIAGNOSIS — M25561 Pain in right knee: Secondary | ICD-10-CM | POA: Diagnosis not present

## 2024-02-29 NOTE — Therapy (Signed)
 OUTPATIENT PHYSICAL THERAPY PROSTHETICS TREATMENT   Patient Name: Katrina Wolfe MRN: [id number] DOB:07/01/1966, 58 y.o., female Today's Date: 02/29/2024  PCP: Dr. Tyson Dense REFERRING PROVIDER: Nadara Mustard, MD    END OF SESSION:  PT End of Session - 02/29/24 0934     Visit Number 12    Number of Visits 13    Date for PT Re-Evaluation 03/06/24    Authorization Type [employer] Medicare/ [employer]    PT Start Time [phone]    PT Stop Time 1012    PT Time Calculation (min) 40 min    Equipment Utilized During Treatment Gait belt;Other (comment)   L BKA prosthetic   Activity Tolerance Patient tolerated treatment well    Behavior During Therapy WFL for tasks assessed/performed                 Past Medical History:  Diagnosis Date   Abnormal uterine bleeding (AUB)    Arthritis    knees, fingers   Asthma    as child, no problem since 1995   Bell's palsy 2004   CKD (chronic kidney disease), stage II    Complication of anesthesia    Congenital cardiomegaly    per pt has always been told since childhood and siblings    Diabetes mellitus without complication (HCC)    Dysrhythmia    Occasionally skipped beats   Elevated liver function tests    GERD (gastroesophageal reflux disease)    History of genital warts    HIV (human immunodeficiency virus infection) (HCC)    DX 1998--  MONITORED BY INFECTIOUS DISEASE-- DR Judyann Munson   Hypercholesterolemia    Hypertension    Intermittent palpitations    mild-- no meds   Legally blind in left eye, as defined in [place]    trauma as child   Peripheral vascular disease (HCC)    Pneumonia    PONV (postoperative nausea and vomiting)    Renal disorder    Tuberculosis    ? 2005 or 2007   Type 2 diabetes mellitus (HCC)    Type II    Uterine fibroid    VIN III (vulvar intraepithelial neoplasia III)    and Verrucoid lesion on the mons   Wears glasses    Past Surgical History:  Procedure Laterality Date   AMPUTATION  Left 06/27/2023   Procedure: LEFT BELOW KNEE AMPUTATION;  Surgeon: Nadara Mustard, MD;  Location: [facility] OR;  Service: Orthopedics;  Laterality: Left;   ANKLE FUSION Left 02/21/2023   Procedure: REVISION LEFT ANKLE FUSION;  Surgeon: Nadara Mustard, MD;  Location: [facility] OR;  Service: Orthopedics;  Laterality: Left;   CESAREAN SECTION  2001  &  1984   CO2 LASER APPLICATION N/A 11/26/2015   Procedure: CO2 LASER APPLICATION AND WIDE VOCAL EXCSION OF LESION ON MONS PUBIS;  Surgeon: De Blanch, MD;  Location: [facility];  Service: Gynecology;  Laterality: N/A;   CASE CANCELLED    CO2 LASER APPLICATION N/A 01/07/2016   Procedure: CO2 LASER OF VULVAR;  Surgeon: De Blanch, MD;  Location: [facility];  Service: Gynecology;  Laterality: N/A;   CORNEAL TRANSPLANT Left 12/18/2004   failed   D & C HYSTEROSCOPY /  RESECTION FIBROID  12/18/2002   DILATATION & CURETTAGE/HYSTEROSCOPY WITH MYOSURE N/A 10/01/2019   Procedure: DILATATION & CURETTAGE/HYSTEROSCOPY WITH MYOSURE and HYDROTHERMAL ABLATION;  Surgeon: Geryl Rankins, MD;  Location: [facility];  Service: Gynecology;  Laterality: N/A;  HTA  rep will be here. Confirmed on 09/25/19 CS   EYE SURGERY     FOOT ARTHRODESIS Left 11/30/2021   Procedure: LEFT TIBIOCALCANEAL FUSION;  Surgeon: Nadara Mustard, MD;  Location: [facility] OR;  Service: Orthopedics;  Laterality: Left;   HARDWARE REMOVAL Left 04/04/2019   Procedure: EXCISION BONE AND REMOVE DEEP HARDWARE LEFT 5TH METATARSAL;  Surgeon: Nadara Mustard, MD;  Location: [facility] OR;  Service: Orthopedics;  Laterality: Left;   HARDWARE REMOVAL Left 08/04/2022   Procedure: REMOVAL DEEP HARDWARE LEFT ANKLE;  Surgeon: Nadara Mustard, MD;  Location: [facility] OR;  Service: Orthopedics;  Laterality: Left;   I & D EXTREMITY Left 06/27/2021   Procedure: IRRIGATION AND DEBRIDEMENT FOOT ULCER;  Surgeon: Kerrin Champagne, MD;  Location: [facility] OR;  Service: Orthopedics;  Laterality: Left;    KNEE ARTHROSCOPY W/ ACL RECONSTRUCTION Bilateral 12/18/2006   ORIF TOE FRACTURE Left 08/02/2017   Procedure: OPEN REDUCTION INTERNAL FIXATION (ORIF) FIFTH METATARSAL (TOE) BASE FRACTURE;  Surgeon: Toni Arthurs, MD;  Location: [facility];  Service: Orthopedics;  Laterality: Left;   VULVECTOMY N/A 01/07/2016   Procedure: WIDE LOCAL EXCISION;  Surgeon: De Blanch, MD;  Location: [facility];  Service: Gynecology;  Laterality: N/A;  mons pubis as site   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, with necrosis of bone (HCC)    Diabetes mellitus type 2, uncontrolled, with complications 06/26/2021   Diabetic nephropathy (HCC) 06/26/2021   CKD (chronic kidney disease), stage III (HCC) 06/26/2021   HTN (hypertension) 06/24/2021   HIV (human immunodeficiency virus infection) (HCC) 06/24/2021   DM2 (diabetes mellitus, type 2) (HCC) 06/24/2021   HLD (hyperlipidemia) 06/24/2021   Osteomyelitis of left foot (HCC) 06/24/2021   Elevated LFTs 06/24/2021   Acute-on-chronic kidney injury (HCC) 06/24/2021   Osteomyelitis of foot (HCC) 04/04/2019   Hardware complicating wound infection (HCC)    Subacute osteomyelitis, left ankle and foot (HCC)    Open displaced fracture of fifth metatarsal bone of left foot    Healthcare maintenance 04/03/2019   HIV disease (HCC) 10/27/2015   DM type 2 (diabetes mellitus, type 2) (HCC) 10/27/2015   Hyperlipidemia 10/27/2015   Essential hypertension 10/27/2015   Severe vulvar dysplasia, histologically confirmed 10/15/2015    ONSET DATE: 12/17/2023  REFERRING DIAG: A21.308 (ICD-10-CM) - S/P BKA (below knee amputation) unilateral, left (HCC)  THERAPY  DIAG:  Muscle weakness (generalized)  Unsteadiness on feet  Chronic pain of right knee  Other abnormalities of gait and mobility  Rationale for Evaluation and Treatment: Rehabilitation  SUBJECTIVE:   SUBJECTIVE STATEMENT: Patient presents in WC. States she still has not received her muscle relaxer but took a Tramadol prior to session, so pain is not bad. No falls   Pt accompanied by: self   PERTINENT HISTORY: HIV, PMH includes: arthritis, asthma, Bell's palsy, CKD II, DM II, HIV, HTN, PVD, blind in L eye, and multiple previous L ankle surgeries, prior R TKA  PAIN:  Are you having pain? Yes: NPRS scale: 2.5/10 in R hip  Pain location: Lateral residual leg, pt also reports of phantom pain at night that wakes her up. Pain description: neuropathic (burning), phantom Aggravating factors: none Relieving factors: Tramadol  PRECAUTIONS:  Fall and Other: HIV  RED FLAGS: None   WEIGHT BEARING RESTRICTIONS: No  FALLS: Has patient fallen in last 6 months? No  LIVING ENVIRONMENT: Lives with: lives with their family and both sons, daughter in Social worker, grandkids Lives in: House/apartment Home Access: Stairs to enter and Ramped entrance Home layout: Two level Stairs:  has stairs but also has ramp entry. Has following equipment at home: Dan Humphreys - 4 wheeled and shower chair  PLOF: Requires assistive device for independence and Needs assistance with gait  PATIENT GOALS: to be able to walk better (pt hasn't walked well since 2018)  OBJECTIVE:  Note: Objective measures were completed at Evaluation unless otherwise noted.  COGNITION: Overall cognitive status: Within functional limits for tasks assessed   LOWER EXTREMITY ROM:  Active ROM Right eval Left eval  Hip flexion    Hip extension    Hip abduction    Hip adduction    Hip internal rotation    Hip external rotation    Knee flexion    Knee extension    Ankle dorsiflexion    Ankle plantarflexion    Ankle inversion    Ankle  eversion     (Blank rows = not tested)  LOWER EXTREMITY MMT:  MMT Right eval Left eval  Hip flexion    Hip extension    Hip abduction    Hip adduction    Hip internal rotation    Hip external rotation    Knee flexion    Knee extension    Ankle dorsiflexion    Ankle plantarflexion    Ankle inversion    Ankle eversion    (Blank rows = not tested)  TRANSFERS: Sit to stand: Total A Stand to sit: Total A Lateral scoot transfers: SBA  GAIT: UNABLE  FUNCTIONAL TESTS: Unable to perform any functional tests as patient is non ambulatory at this time    CURRENT PROSTHETIC WEAR ASSESSMENT: Patient is independent with: skin check, residual limb care, prosthetic cleaning, and ply sock cleaning Patient is dependent with: residual limb care, correct ply sock adjustment, proper wear schedule/adjustment, and proper weight-bearing schedule/adjustment Donning prosthesis: SBA Doffing prosthesis: SBA Prosthetic wear tolerance: 3 hours/day, 7 days/week Prosthetic weight bearing tolerance: 0 minutes Edema: present Residual limb condition: dry scabs present, well healed   PATIENT SURVEYS:  LEFS 8/80                                                                                                                            TREATMENT DATE:  Self-Care/ther act/Manual therapy: Pt donned L BKA prosthetic and Icarus brace on RLE mod I  SciFit multi-peaks level 8.5 for 10 minutes using BUE/BLEs for neural priming for reciprocal movement, dynamic cardiovascular warmup and increased amplitude of stepping. Pt reported slight decrease of pain in R hip w/activity, rating it as 1.5/10.  Pt performed lateral scoot transfer from WC to mat on L side mod I From 58 cm height, pt performed sit  to stands from mat to RW w/CGA: Stand 1: 30s hold w/min cues to shift to L, wide BOS Stand 2: 65s hold w/more narrow BOS. Pt reporting increased pain w/weightbearing, so halted stands  Used spikey ball and lacross  ball to roll out vastus lateralis and IT band bilaterally, x5 minutes. Pt w/several trigger points along ITB and VL bilaterally, but only painful on RLE.  Applied scraping tool to bilateral IT band and vastus lateralis, x5 minutes per side. Educated pt on doing this at home w/butterknife (dull edge) as she reported good pain relief following scraping.     PATIENT EDUCATION: Education details:  Continue HEP, build up wear schedule to 3.5 hours/day  Person educated: Patient Education method: Explanation Education comprehension: verbalized understanding and needs further education  HOME EXERCISE PROGRAM: Access Code: [email or web address] URL: [email or web address] Date: 01/17/2024 Prepared by: Alethia Berthold Octave Montrose  Exercises - Single Leg Bridge  - 1 x daily - 7 x weekly - 3 sets - 10 reps - Clamshell  - 1 x daily - 7 x weekly - 3 sets - 10 reps - Seated knee extension   - 1 x daily - 7 x weekly - 3 sets - 10 reps - Beginner Prone Single Leg Raise  - 1 x daily - 7 x weekly - 1-2 sets - 10-15 reps  ASSESSMENT:  CLINICAL IMPRESSION: Emphasis of skilled PT session on improved functional mobility of BLEs, transfers and pain modulation of RLE via scraping. Pt w/lower pain levels this date due to taking Tramadol prior to session, has not received muscle relaxer yet. Pt able to perform 2 sit to stands w/CGA only but continues to be limited by RLE pain (hip and knee). Noted significant muscle knots and trigger points along ITB and vastus lateralis bilaterally, pt symptomatic on R side only. Pt reported good relief w/scraping tool so will continue to use in therapy.  Continue POC.   OBJECTIVE IMPAIRMENTS: Abnormal gait, decreased activity tolerance, decreased balance, decreased endurance, decreased mobility, difficulty walking, decreased strength, hypomobility, increased edema, increased muscle spasms, impaired flexibility, improper body mechanics, postural dysfunction, prosthetic dependency , and pain.    ACTIVITY LIMITATIONS: carrying, lifting, bending, standing, squatting, stairs, transfers, and locomotion level  PARTICIPATION LIMITATIONS: meal prep, cleaning, laundry, driving, shopping, community activity, and yard work  PERSONAL FACTORS: Time since onset of injury/illness/exacerbation are also affecting patient's functional outcome.   REHAB POTENTIAL: Good  CLINICAL DECISION MAKING: Stable/uncomplicated  EVALUATION COMPLEXITY: Low   GOALS: Goals reviewed with patient? Yes  SHORT TERM GOALS: Target date: 02/07/2024     Patient will tolerate wearing prosthetic for 4 hours/day without discomfort or skin irritation Baseline: 1hour (1/23); 2 hours (2/27) Goal status: IN PROGRESS  2.  Patient will demonstrate proper donning and doffing of the prosthetic independently without any assistance. Baseline: SBA, needs cueing (1/23) Goal status: MET  3.  Patient will be be able to WB on prosthetic for total of 5 min/day to improve functional use. Baseline: unable to stand due to pain in R LE (non amputated Extremity) (1/23) Goal status: IN PROGRESS   4.  Patient will be able to perform stand to sit with max A x 1 with use of RW and be able to perform stand pivot transfers.  Baseline: lateral scoot from chair to bed (SBA) (1/23); sit to stands w/min A  Goal status: IN PROGRESS    LONG TERM GOALS: Target date: 03/06/2024   Patient will be able to ambulate 20 feet with RW and CGA to  improve functional ambulation Baseline: hasn't ambulated since 2018 (1/23) Goal status: INITIAL  2.  Pt will be able to perform stand transer with walker with SBA only Baseline: lateral scoot with SBA (1/23) Goal status: INITIAL  3.  Patient will demo 15% improvement on LEFS to improve functional ability Baseline: 8/80 (1/23) Goal status: INITIAL  4.  Patient will be able to stand for at least 15 min per day in total with use of RW or appropriate AD to improve functional WB Baseline: unable to  stand (1/23) Goal status: INITIAL    PLAN:  PT FREQUENCY: 2x/week  PT DURATION: 8 weeks  PLANNED INTERVENTIONS: [id number]- PT Re-evaluation, 97110-Therapeutic exercises, 97530- Therapeutic activity, 97112- Neuromuscular re-education, 97535- Self Care, 16109- Manual therapy, [phone]- Gait training, [phone]- Prosthetic training, Patient/Family education, Balance training, Stair training, Dry Needling, Joint mobilization, Scar mobilization, DME instructions, Wheelchair mobility training, Cryotherapy, and Moist heat  PLAN FOR NEXT SESSION:.Did she get prescription? Stand pivot. Pt is +2 asssist currently due to patella tendon rupture in bilateral knees. Start w/scifit for improved knee extension ROM. Gait Training w/WC follow    Jill Alexanders Shaquandra Galano, PT, DPT  02/29/2024, 10:15 AM

## 2024-03-04 ENCOUNTER — Ambulatory Visit: Payer: Self-pay | Admitting: Physical Therapy

## 2024-03-04 DIAGNOSIS — M6281 Muscle weakness (generalized): Secondary | ICD-10-CM | POA: Diagnosis not present

## 2024-03-04 DIAGNOSIS — M25561 Pain in right knee: Secondary | ICD-10-CM | POA: Diagnosis not present

## 2024-03-04 DIAGNOSIS — R2681 Unsteadiness on feet: Secondary | ICD-10-CM

## 2024-03-04 DIAGNOSIS — R2689 Other abnormalities of gait and mobility: Secondary | ICD-10-CM | POA: Diagnosis not present

## 2024-03-04 DIAGNOSIS — G8929 Other chronic pain: Secondary | ICD-10-CM

## 2024-03-04 NOTE — Therapy (Signed)
 OUTPATIENT PHYSICAL THERAPY PROSTHETICS TREATMENT- RECERTIFICATION    Patient Name: Natividad Schlosser MRN: [id number] DOB:[birth year], 58 y.o., female Today's Date: 03/04/2024  PCP: Dr. Tyson Dense REFERRING PROVIDER: Nadara Mustard, MD    END OF SESSION:  PT End of Session - 03/04/24 0934     Visit Number 13    Number of Visits 25   Recert   Date for PT Re-Evaluation 04/22/24    Authorization Type [employer] Medicare/ [employer]    PT Start Time 0930    PT Stop Time 1010    PT Time Calculation (min) 40 min    Equipment Utilized During Treatment Gait belt;Other (comment)   L BKA prosthetic   Activity Tolerance Patient limited by pain    Behavior During Therapy WFL for tasks assessed/performed                  Past Medical History:  Diagnosis Date   Abnormal uterine bleeding (AUB)    Arthritis    knees, fingers   Asthma    as child, no problem since 1995   Bell's palsy 2004   CKD (chronic kidney disease), stage II    Complication of anesthesia    Congenital cardiomegaly    per pt has always been told since childhood and siblings    Diabetes mellitus without complication (HCC)    Dysrhythmia    Occasionally skipped beats   Elevated liver function tests    GERD (gastroesophageal reflux disease)    History of genital warts    HIV (human immunodeficiency virus infection) (HCC)    DX 1998--  MONITORED BY INFECTIOUS DISEASE-- DR Judyann Munson   Hypercholesterolemia    Hypertension    Intermittent palpitations    mild-- no meds   Legally blind in left eye, as defined in [place]    trauma as child   Peripheral vascular disease (HCC)    Pneumonia    PONV (postoperative nausea and vomiting)    Renal disorder    Tuberculosis    ? 2005 or 2007   Type 2 diabetes mellitus (HCC)    Type II    Uterine fibroid    VIN III (vulvar intraepithelial neoplasia III)    and Verrucoid lesion on the mons   Wears glasses    Past Surgical History:  Procedure Laterality  Date   AMPUTATION Left 06/27/2023   Procedure: LEFT BELOW KNEE AMPUTATION;  Surgeon: Nadara Mustard, MD;  Location: [facility] OR;  Service: Orthopedics;  Laterality: Left;   ANKLE FUSION Left 02/21/2023   Procedure: REVISION LEFT ANKLE FUSION;  Surgeon: Nadara Mustard, MD;  Location: [facility] OR;  Service: Orthopedics;  Laterality: Left;   CESAREAN SECTION  2001  &  1984   CO2 LASER APPLICATION N/A 11/26/2015   Procedure: CO2 LASER APPLICATION AND WIDE VOCAL EXCSION OF LESION ON MONS PUBIS;  Surgeon: De Blanch, MD;  Location: [facility];  Service: Gynecology;  Laterality: N/A;   CASE CANCELLED    CO2 LASER APPLICATION N/A 01/07/2016   Procedure: CO2 LASER OF VULVAR;  Surgeon: De Blanch, MD;  Location: [facility];  Service: Gynecology;  Laterality: N/A;   CORNEAL TRANSPLANT Left 12/18/2004   failed   D & C HYSTEROSCOPY /  RESECTION FIBROID  12/18/2002   DILATATION & CURETTAGE/HYSTEROSCOPY WITH MYOSURE N/A 10/01/2019   Procedure: DILATATION & CURETTAGE/HYSTEROSCOPY WITH MYOSURE and HYDROTHERMAL ABLATION;  Surgeon: Geryl Rankins, MD;  Location: [facility];  Service: Gynecology;  Laterality: N/A;  HTA rep will be here. Confirmed on 09/25/19 CS   EYE SURGERY     FOOT ARTHRODESIS Left 11/30/2021   Procedure: LEFT TIBIOCALCANEAL FUSION;  Surgeon: Nadara Mustard, MD;  Location: [facility] OR;  Service: Orthopedics;  Laterality: Left;   HARDWARE REMOVAL Left 04/04/2019   Procedure: EXCISION BONE AND REMOVE DEEP HARDWARE LEFT 5TH METATARSAL;  Surgeon: Nadara Mustard, MD;  Location: [facility] OR;  Service: Orthopedics;  Laterality: Left;   HARDWARE REMOVAL Left 08/04/2022   Procedure: REMOVAL DEEP HARDWARE LEFT ANKLE;  Surgeon: Nadara Mustard, MD;  Location: [facility] OR;  Service: Orthopedics;  Laterality: Left;   I & D EXTREMITY Left 06/27/2021   Procedure: IRRIGATION AND DEBRIDEMENT FOOT ULCER;  Surgeon: Kerrin Champagne, MD;  Location: [facility] OR;  Service: Orthopedics;   Laterality: Left;   KNEE ARTHROSCOPY W/ ACL RECONSTRUCTION Bilateral 12/18/2006   ORIF TOE FRACTURE Left 08/02/2017   Procedure: OPEN REDUCTION INTERNAL FIXATION (ORIF) FIFTH METATARSAL (TOE) BASE FRACTURE;  Surgeon: Toni Arthurs, MD;  Location: [facility];  Service: Orthopedics;  Laterality: Left;   VULVECTOMY N/A 01/07/2016   Procedure: WIDE LOCAL EXCISION;  Surgeon: De Blanch, MD;  Location: [facility];  Service: Gynecology;  Laterality: N/A;  mons pubis as site   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, with necrosis of bone (HCC)    Diabetes mellitus type 2, uncontrolled, with complications 06/26/2021   Diabetic nephropathy (HCC) 06/26/2021   CKD (chronic kidney disease), stage III (HCC) 06/26/2021   HTN (hypertension) 06/24/2021   HIV (human immunodeficiency virus infection) (HCC) 06/24/2021   DM2 (diabetes mellitus, type 2) (HCC) 06/24/2021   HLD (hyperlipidemia) 06/24/2021   Osteomyelitis of left foot (HCC) 06/24/2021   Elevated LFTs 06/24/2021   Acute-on-chronic kidney injury (HCC) 06/24/2021   Osteomyelitis of foot (HCC) 04/04/2019   Hardware complicating wound infection (HCC)    Subacute osteomyelitis, left ankle and foot (HCC)    Open displaced fracture of fifth metatarsal bone of left foot    Healthcare maintenance 04/03/2019   HIV disease (HCC) 10/27/2015   DM type 2 (diabetes mellitus, type 2) (HCC) 10/27/2015   Hyperlipidemia 10/27/2015   Essential hypertension 10/27/2015   Severe vulvar dysplasia, histologically confirmed 10/15/2015    ONSET DATE: 12/17/2023  REFERRING DIAG: Y86.578 (ICD-10-CM) - S/P BKA (below knee amputation) unilateral,  left (HCC)  THERAPY DIAG:  Muscle weakness (generalized)  Unsteadiness on feet  Chronic pain of right knee  Other abnormalities of gait and mobility  Rationale for Evaluation and Treatment: Rehabilitation  SUBJECTIVE:   SUBJECTIVE STATEMENT: Patient presents in WC. Reports she received her muscle relaxer on Saturday and her R hip has been feeling a lot better. Having some pain in residual LLE but is tolerable.   Pt accompanied by: self   PERTINENT HISTORY: HIV, PMH includes: arthritis, asthma, Bell's palsy, CKD II, DM II, HIV, HTN, PVD, blind in L eye, and multiple previous L ankle surgeries, prior R TKA  PAIN:  Are you having pain? Yes: NPRS scale: 3/10 in distal LLE  Pain location: Lateral residual leg, pt also reports of phantom pain at night that wakes her up. Pain description: neuropathic (burning), phantom  Aggravating factors: none Relieving factors: Tramadol  PRECAUTIONS: Fall and Other: HIV  RED FLAGS: None   WEIGHT BEARING RESTRICTIONS: No  FALLS: Has patient fallen in last 6 months? No  LIVING ENVIRONMENT: Lives with: lives with their family and both sons, daughter in Social worker, grandkids Lives in: House/apartment Home Access: Stairs to enter and Ramped entrance Home layout: Two level Stairs:  has stairs but also has ramp entry. Has following equipment at home: Dan Humphreys - 4 wheeled and shower chair  PLOF: Requires assistive device for independence and Needs assistance with gait  PATIENT GOALS: to be able to walk better (pt hasn't walked well since 2018)  OBJECTIVE:  Note: Objective measures were completed at Evaluation unless otherwise noted.  COGNITION: Overall cognitive status: Within functional limits for tasks assessed   LOWER EXTREMITY ROM:  Active ROM Right eval Left eval  Hip flexion    Hip extension    Hip abduction    Hip adduction    Hip internal rotation    Hip external rotation    Knee flexion    Knee extension    Ankle dorsiflexion     Ankle plantarflexion    Ankle inversion    Ankle eversion     (Blank rows = not tested)  LOWER EXTREMITY MMT:  MMT Right eval Left eval  Hip flexion    Hip extension    Hip abduction    Hip adduction    Hip internal rotation    Hip external rotation    Knee flexion    Knee extension    Ankle dorsiflexion    Ankle plantarflexion    Ankle inversion    Ankle eversion    (Blank rows = not tested)  TRANSFERS: Sit to stand: Total A Stand to sit: Total A Lateral scoot transfers: SBA  GAIT: UNABLE  FUNCTIONAL TESTS: Unable to perform any functional tests as patient is non ambulatory at this time    CURRENT PROSTHETIC WEAR ASSESSMENT: Patient is independent with: skin check, residual limb care, prosthetic cleaning, and ply sock cleaning Patient is dependent with: residual limb care, correct ply sock adjustment, proper wear schedule/adjustment, and proper weight-bearing schedule/adjustment Donning prosthesis: SBA Doffing prosthesis: SBA Prosthetic wear tolerance: 3 hours/day, 7 days/week Prosthetic weight bearing tolerance: 0 minutes Edema: present Residual limb condition: dry scabs present, well healed   PATIENT SURVEYS:  LEFS 8/80                                                                                                                            TREATMENT DATE:  Self-Care/Ther Act Pt donned L BKA prosthetic and Icarus brace on RLE mod I  Educated pt on importance of wearing shrinker on LLE if not wearing leg and performing desensitization techniques to LLE to assist w/phantom pain. Added to HEP (See bolded below)  SciFit multi-peaks level 9 for 8 minutes using BUE/BLEs for neural priming for reciprocal movement, dynamic cardiovascular warmup and increased amplitude of  stepping. Pt reported slight decrease of pain in R hip w/activity, rating it as 1.5/10.  Pt performed single sit to stand from [facility] to RW w/CGA but was unable to tolerate weightbearing due to pain in  R knee. Pt reports she is out of Tramadol and will not be receiving refill until April. Pt reports she is supposed to receive 90-day supply but has only been receiving a 10-day supply, so has been taking them sparingly. Encouraged pt to call PCP and request updated prescription sent to a local pharmacy (pt receiving meds from [place] currently). Pt reports she will call today  Pt's current WC is in desperate need for replacement, so discussed need for manual chair vs power chair. At this time, pt more appropriate for power chair due to risk for pressure injuries and not being a functional ambulator. Pt reports she would prefer power chair as she has transportation and a ramped entry, so will request order today.     PATIENT EDUCATION: Education details:  Continue HEP, build up wear schedule to 4 hours/day, see self-care section  Person educated: Patient Education method: Explanation Education comprehension: verbalized understanding and needs further education  HOME EXERCISE PROGRAM: Access Code: [email or web address] URL: [email or web address] Date: 01/17/2024 Prepared by: Alethia Berthold Takeshia Wenk  Exercises - Single Leg Bridge  - 1 x daily - 7 x weekly - 3 sets - 10 reps - Clamshell  - 1 x daily - 7 x weekly - 3 sets - 10 reps - Seated knee extension   - 1 x daily - 7 x weekly - 3 sets - 10 reps - Beginner Prone Single Leg Raise  - 1 x daily - 7 x weekly - 1-2 sets - 10-15 reps  Desensitization Techniques: -Light touch/pressure:  using fingertip pressure to slowly move up small segments of the affected body part until outside the area of pain and then back to where you started -Deep pressure:  using fingertips to squeeze in small segments starting outside the affected area, crossing over the affected area, and then to the other side of the affected area -Tapping:  fingertips tap with firm pressure over the affected area, can move around the affected area as well -Brushing/scratching:  use fingertips  to brush/scratch over and around the affected area -Textures:  use soft and rough textured items like cotton balls vs wash cloths to brush or rub with light into firm pressure over and around the affected area  Repeat these 3-4x per day.   ASSESSMENT:  CLINICAL IMPRESSION: Session limited as pt w/increased R knee pain in stance and has ran out of Tramadol. Pt did receive muscle relaxer and has no pain in hip. Pt no longer TTP along vastus lateralis and states her leg is tremendously better, but needs Tramadol to tolerate therapy on her R knee. Pt now able to perform sit to stand transfers from [facility] to RW w/CGA but unable to tolerate stance or gait training without Tramadol. Pt to call PCP to request updated prescription and find a local pharmacy, as pt currently receiving meds from [place]. Continue POC.   OBJECTIVE IMPAIRMENTS: Abnormal gait, decreased activity tolerance, decreased balance, decreased endurance, decreased mobility, difficulty walking, decreased strength, hypomobility, increased edema, increased muscle spasms, impaired flexibility, improper body mechanics, postural dysfunction, prosthetic dependency , and pain.   ACTIVITY LIMITATIONS: carrying, lifting, bending, standing, squatting, stairs, transfers, and locomotion level  PARTICIPATION LIMITATIONS: meal prep, cleaning, laundry, driving, shopping, community activity, and yard work  PERSONAL FACTORS: Time since onset of injury/illness/exacerbation  are also affecting patient's functional outcome.   REHAB POTENTIAL: Good  CLINICAL DECISION MAKING: Stable/uncomplicated  EVALUATION COMPLEXITY: Low   GOALS: Goals reviewed with patient? Yes  SHORT TERM GOALS: Target date: 02/07/2024   Patient will tolerate wearing prosthetic for 4 hours/day without discomfort or skin irritation Baseline: 1hour (1/23); 2 hours (2/27) Goal status: IN PROGRESS  2.  Patient will demonstrate proper donning and doffing of the prosthetic independently  without any assistance. Baseline: SBA, needs cueing (1/23) Goal status: MET  3.  Patient will be be able to WB on prosthetic for total of 5 min/day to improve functional use. Baseline: unable to stand due to pain in R LE (non amputated Extremity) (1/23) Goal status: IN PROGRESS   4.  Patient will be able to perform stand to sit with max A x 1 with use of RW and be able to perform stand pivot transfers.  Baseline: lateral scoot from chair to bed (SBA) (1/23); sit to stands w/min A  Goal status: IN PROGRESS    LONG TERM GOALS: Target date: 03/06/2024   Patient will be able to ambulate 20 feet with RW and CGA to improve functional ambulation Baseline: hasn't ambulated since 2018 (1/23) Goal status: IN PROGRESS   2.  Pt will be able to perform stand transer with walker with SBA only Baseline: lateral scoot with SBA (1/23) Goal status: IN PROGRESS   3.  Patient will demo 15% improvement on LEFS to improve functional ability Baseline: 8/80 (1/23) Goal status: IN PROGRESS   4.  Patient will be able to stand for at least 15 min per day in total with use of RW or appropriate AD to improve functional WB Baseline: unable to stand (1/23) Goal status: INITIAL  NEW SHORT TERM GOALS FOR EXTENDED POC:   Target date: 04/01/2024  Pt will perform stand pivot w/RW and min A for improved weightbearing tolerance on LLE and independence  Baseline:  Goal status: INITIAL  2.  Pt will ambulate 20' w/RW and min A for improved endurance and weightbearing tolerance  Baseline: 103' w/min A  Goal status: INITIAL  3.   Patient will demo 15% improvement on LEFS to improve functional ability Baseline:  8/80 (1/23) Goal status: IN PROGRESS   NEW LONG TERM GOALS FOR EXTENDED POC:  Target date: 04/15/2024  Pt will ambulate 50' w/RW and min A for improved independence and endurance for household distances  Baseline: 45' w/min A  Goal status: INITIAL  2.  Pt will perform stand pivot w/RW and SBA for  improved independence and functional mobility   Baseline:  Goal status: INITIAL    PLAN:  PT FREQUENCY: 2x/week  PT DURATION: 8 weeks  PLANNED INTERVENTIONS: 97164- PT Re-evaluation, 97110-Therapeutic exercises, 97530- Therapeutic activity, 97112- Neuromuscular re-education, 97535- Self Care, 82956- Manual therapy, [phone]- Gait training, [phone]- Prosthetic training, Patient/Family education, Balance training, Stair training, Dry Needling, Joint mobilization, Scar mobilization, DME instructions, Wheelchair mobility training, Cryotherapy, and Moist heat  PLAN FOR NEXT SESSION:. Stand pivot. Pt is +2 asssist currently due to patella tendon rupture in bilateral knees. Start w/scifit for improved knee extension ROM. Gait Training w/WC follow    Jill Alexanders Phelicia Dantes, PT, DPT  03/04/2024, 10:12 AM

## 2024-03-05 ENCOUNTER — Telehealth: Payer: Self-pay | Admitting: Physical Therapy

## 2024-03-05 NOTE — Telephone Encounter (Signed)
 Dr. Lajoyce Corners,  Katrina Wolfe is being seen by physical therapy for R BKA and bilateral patellar tendon ruptures.  The patient would benefit from a WC evaluation for a custom power WC to improve patient's safety and mobility.    If you agree, please place an order in [facility] workque in [facility] or fax the order to [phone].   Thank you, Josephine Igo, PT, DPT [facility] [address] [place], [place]  [address] Phone:  [phone] Fax:  [phone]

## 2024-03-06 ENCOUNTER — Ambulatory Visit: Payer: Self-pay | Admitting: Physical Therapy

## 2024-03-06 ENCOUNTER — Other Ambulatory Visit: Payer: Self-pay | Admitting: Orthopedic Surgery

## 2024-03-06 DIAGNOSIS — Z89512 Acquired absence of left leg below knee: Secondary | ICD-10-CM

## 2024-03-08 ENCOUNTER — Emergency Department (HOSPITAL_COMMUNITY)
Admission: EM | Admit: 2024-03-08 | Discharge: 2024-03-08 | Disposition: A | Discharge status: Home / Self Care | Attending: Emergency Medicine | Admitting: Emergency Medicine

## 2024-03-08 ENCOUNTER — Encounter (HOSPITAL_COMMUNITY): Payer: Self-pay

## 2024-03-08 ENCOUNTER — Emergency Department (HOSPITAL_COMMUNITY)

## 2024-03-08 ENCOUNTER — Other Ambulatory Visit: Payer: Self-pay

## 2024-03-08 DIAGNOSIS — H04133 Lacrimal cyst, bilateral lacrimal glands: Secondary | ICD-10-CM | POA: Insufficient documentation

## 2024-03-08 DIAGNOSIS — Z21 Asymptomatic human immunodeficiency virus [HIV] infection status: Secondary | ICD-10-CM | POA: Diagnosis not present

## 2024-03-08 DIAGNOSIS — N644 Mastodynia: Secondary | ICD-10-CM | POA: Diagnosis not present

## 2024-03-08 DIAGNOSIS — Z7984 Long term (current) use of oral hypoglycemic drugs: Secondary | ICD-10-CM | POA: Insufficient documentation

## 2024-03-08 DIAGNOSIS — Z89512 Acquired absence of left leg below knee: Secondary | ICD-10-CM | POA: Diagnosis not present

## 2024-03-08 DIAGNOSIS — Z794 Long term (current) use of insulin: Secondary | ICD-10-CM | POA: Insufficient documentation

## 2024-03-08 DIAGNOSIS — Z79899 Other long term (current) drug therapy: Secondary | ICD-10-CM | POA: Insufficient documentation

## 2024-03-08 DIAGNOSIS — M7641 Tibial collateral bursitis [Pellegrini-Stieda], right leg: Secondary | ICD-10-CM | POA: Diagnosis not present

## 2024-03-08 DIAGNOSIS — M7640 Tibial collateral bursitis [Pellegrini-Stieda], unspecified leg: Secondary | ICD-10-CM | POA: Diagnosis not present

## 2024-03-08 DIAGNOSIS — M25561 Pain in right knee: Secondary | ICD-10-CM | POA: Diagnosis not present

## 2024-03-08 DIAGNOSIS — Z7982 Long term (current) use of aspirin: Secondary | ICD-10-CM | POA: Diagnosis not present

## 2024-03-08 DIAGNOSIS — H5789 Other specified disorders of eye and adnexa: Secondary | ICD-10-CM | POA: Diagnosis present

## 2024-03-08 DIAGNOSIS — L309 Dermatitis, unspecified: Secondary | ICD-10-CM | POA: Insufficient documentation

## 2024-03-08 DIAGNOSIS — I10 Essential (primary) hypertension: Secondary | ICD-10-CM | POA: Diagnosis not present

## 2024-03-08 DIAGNOSIS — E119 Type 2 diabetes mellitus without complications: Secondary | ICD-10-CM | POA: Insufficient documentation

## 2024-03-08 LAB — BASIC METABOLIC PANEL
Anion gap: 12 (ref 5–15)
BUN: 12 mg/dL (ref 6–20)
CO2: 22 mmol/L (ref 22–32)
Calcium: 9.2 mg/dL (ref 8.9–10.3)
Chloride: 105 mmol/L (ref 98–111)
Creatinine, Ser: 1.24 mg/dL — ABNORMAL HIGH (ref 0.44–1.00)
GFR, Estimated: 51 mL/min — ABNORMAL LOW (ref >60)
Glucose, Bld: 162 mg/dL — ABNORMAL HIGH (ref 70–99)
Potassium: 4.2 mmol/L (ref 3.5–5.1)
Sodium: 139 mmol/L (ref 135–145)

## 2024-03-08 LAB — CBC
HCT: 47.8 % — ABNORMAL HIGH (ref 36.0–46.0)
Hemoglobin: 16.2 g/dL — ABNORMAL HIGH (ref 12.0–15.0)
MCH: 34.3 pg — ABNORMAL HIGH (ref 26.0–34.0)
MCHC: 33.9 g/dL (ref 30.0–36.0)
MCV: 101.3 fL — ABNORMAL HIGH (ref 80.0–100.0)
Platelets: 409 10*3/uL — ABNORMAL HIGH (ref 150–400)
RBC: 4.72 MIL/uL (ref 3.87–5.11)
RDW: 12.9 % (ref 11.5–15.5)
WBC: 10.2 10*3/uL (ref 4.0–10.5)
nRBC: 0 % (ref 0.0–0.2)

## 2024-03-08 MED ORDER — FLUORESCEIN SODIUM 1 MG OP STRP
ORAL_STRIP | OPHTHALMIC | Status: AC
  Filled 2024-03-08: qty 1

## 2024-03-08 MED ORDER — NYSTATIN 100000 UNIT/GM EX POWD: 1.0000 | Freq: Three times a day (TID) | CUTANEOUS | 0 refills | Status: AC

## 2024-03-08 NOTE — ED Triage Notes (Signed)
 Pt came in via POV d/t Lt eye draining for the past month, does report a cyst on top of it. Did see an eye Dr last year & she states they were unable to determine how to remove the cyst that is on top of it. Pt reports the pain in the Lt eye is 8/10, is legally blind in that eye at baseline. Pt also endorses Lt breast "burning" pain that started a week ago & the pain in her Rt knee that she is doing therapy on is becoming intolerable. A/Ox4, Lt BKA amputation, uses w/c at baseline.

## 2024-03-08 NOTE — ED Provider Triage Note (Signed)
 Emergency Medicine Provider Triage Evaluation Note  Etheleen Nicks , a 58 y.o. female  was evaluated in triage.  Pt presents with multiple complaints.  Patient has history of blindness in the left eye, has been seeing an eye doctor because she has a cyst in the upper eyelid.  She states has been draining for the past month, and she most recently saw an eye doctor last year, they were unable to figure out how to remove the cyst.  She has been having some "burning" pain underneath her right breast for about a week or so.  Her granddaughter noticed some redness as well.  She is not sure if she could have changed detergents or something recently.  She is also having pain in the right knee.  She has a history of a left BKA potation, follows with Dr. Lajoyce Corners.  Uses a wheelchair at baseline.  Most recently saw Dr. Lajoyce Corners a month ago.  She had physical therapy last week and had severe pain in her right hip and upper leg, and they mention that she has a lot of scar tissue.  She is concerned because she has had prior surgery on the right knee, and thinks that it could all be connected.  Review of Systems  Positive: Left eye pain, right breast rash, right knee pain, chills Negative: Fever  Physical Exam  BP 136/89   Pulse 91   Temp 98 F (36.7 C)   Resp 18   Ht 5' 9.5" (1.765 m)   Wt 119.3 kg   LMP 04/27/2021   SpO2 99%   BMI 38.28 kg/m  Gen:   Awake, no distress   Resp:  Normal effort  MSK:   Moves extremities without difficulty  Other:  Mild redness under the R breast  Medical Decision Making  Medically screening exam initiated at 11:34 AM.  Appropriate orders placed.  Nalia Mcvicar was informed that the remainder of the evaluation will be completed by another provider, this initial triage assessment does not replace that evaluation, and the importance of remaining in the ED until their evaluation is complete.  Workup initiated   Su Monks, PA-C 03/08/24 1137

## 2024-03-08 NOTE — Discharge Instructions (Addendum)
 Today you were seen for right knee pain, left eye watering, and dermatitis of the breast.  Please pick up your nystatin powder and use as directed.  Please follow-up with ophthalmology and orthopedics for further evaluation and treatment of your eye and knee issue.  Thank you for letting us treat you today. After reviewing your labs and imaging, I feel you are safe to go home. Please follow up with your PCP in the next several days and provide them with your records from this visit. Return to the Emergency Room if pain becomes severe or symptoms worsen.

## 2024-03-08 NOTE — ED Provider Notes (Signed)
 [place] EMERGENCY DEPARTMENT AT [facility] Provider Note   CSN: [id number] Arrival date & time: 03/08/24  1023     History  Chief Complaint  Patient presents with   Lt Eye Pain   Rt Knee Pain   Lt Breast Pain    Katrina Wolfe is a 58 y.o. female past medical history significant for diabetes, HIV, hypertension, legally blind presents today for left eye drainage x 1 month, breast " burning" pain that began a week ago, and right knee pain.  Patient denies any recent injury to right knee and states that she has had pain in it for years.  Patient also notes that she was seen approximately 1 year ago for a cyst on her left eyelid for which surgery was brought up, patient has not seen ophthalmology for this issue within the last year although her symptoms have progressed and she is having more frequent pain and tearing.  Patient denies any new injury to the eye.  Patient reports bilateral pain under  HPI     Home Medications Prior to Admission medications   Medication Sig Start Date End Date Taking? Authorizing Provider  nystatin (MYCOSTATIN/NYSTOP) powder Apply 1 Application topically 3 (three) times daily. 03/08/24  Yes Dolphus Jenny, PA-C  acetaminophen (TYLENOL) 500 MG tablet Take 1,000 mg by mouth every 6 (six) hours as needed for moderate pain.    [provider]  amLODipine (NORVASC) 5 MG tablet Take 5 mg by mouth in the morning.    [provider]  aspirin EC 81 MG tablet Take 81 mg by mouth daily. Swallow whole.    [provider]  calcium carbonate (TUMS - DOSED IN MG ELEMENTAL CALCIUM) 500 MG chewable tablet Chew 1 tablet by mouth as needed for indigestion or heartburn.    [provider]  Calcium Carbonate-Vitamin D 600-200 MG-UNIT TABS Take 1 tablet by mouth daily.    [provider]  COMFORT EZ PEN NEEDLES 31G X 8 MM MISC USE WITH insulin THREE TIMES DAILY AS DIRECTED 01/25/22   [provider]  Continuous  Blood Gluc Receiver (DEXCOM G6 RECEIVER) DEVI as directed 30 days 06/13/22   [provider]  diphenhydrAMINE (BENADRYL) 25 MG tablet Take 25 mg by mouth every 6 (six) hours as needed for allergies (or allergic reactions).    [provider]  dolutegravir-lamiVUDine (DOVATO) 50-300 MG tablet TAKE ONE TABLET BY MOUTH EVERYDAY AT BEDTIME 10/22/23   Judyann Munson, MD  Dulaglutide (TRULICITY) 4.5 MG/0.5ML SOPN Inject 4.5 mg into the skin every Friday. Patient not taking: Reported on 10/22/2023    [provider]  gabapentin (NEURONTIN) 100 MG capsule Take 100 mg by mouth 4 (four) times daily.    [provider]  glimepiride (AMARYL) 2 MG tablet Take 2 mg by mouth daily with breakfast.    [provider]  HUMALOG MIX 75/25 KWIKPEN (75-25) 100 UNIT/ML Kwikpen Inject 45 Units into the skin 2 (two) times daily. Breakfast and Dinner 10/06/15   [provider]  Insulin Syringe-Needle U-100 (INSULIN SYRINGE 1CC/31GX5/16") 31G X 5/16" 1 ML MISC 1 Units by Does not apply route 2 (two) times daily. 11/26/15   Lavera Guise, MD  Lancets [facility] ULTRASOFT) lancets  11/29/15   [provider]  lisinopril-hydrochlorothiazide (ZESTORETIC) 20-25 MG tablet Take 1 tablet by mouth daily.    [provider]  methocarbamol (ROBAXIN) 500 MG tablet TAKE 1 TABLET BY MOUTH EVERY 8 HOURS AS NEEDED FOR MUSCLE  SPASMS 02/26/24   Adonis Huguenin, NP  norethindrone (AYGESTIN) 5 MG tablet Take 5 mg by mouth in the morning and at bedtime.    [provider]  ONE TOUCH ULTRA TEST test strip  11/29/15   [provider]  oxyCODONE (OXY IR/ROXICODONE) 5 MG immediate release tablet Take 1 tablet (5 mg total) by mouth every 4 (four) hours as needed for severe pain. Patient not taking: Reported on 10/22/2023 07/01/23   Nadara Mustard, MD  pantoprazole (PROTONIX) 20 MG tablet Take 20 mg by mouth every morning. 05/29/23   [provider]  pioglitazone  (ACTOS) 30 MG tablet Take 30 mg by mouth every morning. 06/19/23   [provider]  rosuvastatin (CRESTOR) 5 MG tablet Take 5 mg by mouth daily with lunch. 07/27/22   [provider]  traMADol (ULTRAM) 50 MG tablet Take 50 mg by mouth 3 (three) times daily as needed.    [provider]      Allergies    Shellfish allergy and Bactrim [sulfamethoxazole-trimethoprim]    Review of Systems   Review of Systems  Eyes:        Eye watering  Musculoskeletal:  Positive for arthralgias.  Skin:  Positive for rash.    Physical Exam Updated Vital Signs BP 136/89   Pulse 91   Temp 98 F (36.7 C)   Resp 18   Ht 5' 9.5" (1.765 m)   Wt 119.3 kg   LMP 04/27/2021   SpO2 99%   BMI 38.28 kg/m  Physical Exam Vitals and nursing note reviewed.  Constitutional:      General: She is not in acute distress.    Appearance: She is well-developed.  HENT:     Head: Normocephalic and atraumatic.  Eyes:     Conjunctiva/sclera: Conjunctivae normal.     Right eye: Right conjunctiva is not injected. No chemosis.    Left eye: Left conjunctiva is not injected. No chemosis.    Comments: Patient has enlarged lacrimal gland of the left eye.  No erythema or injury noted, no pain with extraocular movements.  Minimal tearing noted on exam.  Unable to assess vision in this eye as patient is blind in this eye at baseline.  Cardiovascular:     Rate and Rhythm: Normal rate and regular rhythm.     Heart sounds: No murmur heard. Pulmonary:     Effort: Pulmonary effort is normal. No respiratory distress.     Breath sounds: Normal breath sounds.  Abdominal:     Palpations: Abdomen is soft.     Tenderness: There is no abdominal tenderness.  Musculoskeletal:        General: No swelling.     Cervical back: Neck supple.     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  Patient has mild tenderness to tibial plateau.  Patient has full range  of motion, neurovascularly intact, +2 dorsalis pedis pulse.  Skin:    General: Skin is warm and dry.     Capillary Refill: Capillary refill takes less than 2 seconds.     Comments: Patient has diffuse moist, mildly erythematous, scaly rash underneath bilateral breast folds.  Neurological:     Mental Status: She is alert.  Psychiatric:        Mood and Affect: Mood normal.     ED Results / Procedures / Treatments   Labs (all labs ordered are listed, but only abnormal results are displayed) Labs Reviewed  CBC - Abnormal; Notable for the following components:      Result Value   Hemoglobin 16.2 (*)    HCT 47.8 (*)    MCV 101.3 (*)    MCH 34.3 (*)    Platelets 409 (*)    All other components within normal limits  BASIC METABOLIC PANEL - Abnormal; Notable for the following components:   Glucose, Bld 162 (*)    Creatinine, Ser 1.24 (*)    GFR, Estimated 51 (*)    All other components within normal limits    EKG None  Radiology DG Knee Complete 4 Views Right Result Date: 03/08/2024 CLINICAL DATA:  Right knee pain EXAM: RIGHT KNEE - COMPLETE 4+ VIEW COMPARISON:  07/06/2023 FINDINGS: Patella alta, Insall-Salvati ratio 2.2, with indistinct and lobulated contour at the expected location of the patellar tendon. Chronic ossification medially along the medial femoral condyle compatible with Pellegrini-Stieda disease. Both of these findings are not substantially changed from 07/06/2023. Tricompartmental marginal spurring most striking in the patellofemoral compartment. No fracture or acute bony findings. IMPRESSION: 1. Patella alta with Insall-Salvati ratio 2.2, with indistinct and lobulated contour at the expected location of the patellar tendon. 2. Pellegrini-Stieda disease. 3. Tricompartmental marginal spurring most striking in the patellofemoral compartment. Electronically Signed   By: Gaylyn Rong M.D.   On: 03/08/2024 12:47    Procedures Procedures    Medications Ordered in  ED Medications - No data to display  ED Course/ Medical Decision Making/ A&P                                 Medical Decision Making  This patient presents to the ED with chief complaint(s) of left eye pain, right knee pain, left breast pain with pertinent past medical history of diabetes, hypertension, CKD which further complicates the presenting complaint. The complaint involves an extensive differential diagnosis and also carries with it a high risk of complications and morbidity.    The differential diagnosis includes cellulitis, yeast dermatitis, bacterial conjunctivitis, corneal abrasion, arthritis, musculoskeletal pain  Additional history obtained: Records reviewed orthopedic notes  ED Course and Reassessment:   Independent labs interpretation:  The following labs were independently interpreted:  CBC: Elevated hemoglobin which is not uncommon per historical values BMP: Elevated creatinine which is chronic per historical values  Independent visualization of imaging: - I independently visualized the following imaging with scope of interpretation limited to determining acute life threatening conditions related to emergency care: Right knee x-ray, which revealed Patella alta with Insall-Salvati ratio 2.2, with indistinct and lobulated contour at the expected location of the patellar tendon. Pellegrini-Stieda disease. Tricompartmental marginal spurring most striking in the patellofemoral compartment.  Consultation: - Consulted or discussed management/test interpretation w/ external professional: None  Consideration for admission or further workup: Considered for admission or further workup however the patient wants vital signs, exam, labs, and imaging were reassuring.  Patient's enlarged lacrimal gland on L eye will be referred to ophthalmology for further evaluation and treatment.  Patient already sees orthopedics for other concerns and will be referred to follow-up for right knee  findings today.  Patient's breast exam most consistent with dermatitis, possibly yeast given exam.  Patient will be prescribed nystatin powder for this issue and told to follow-up with primary care physician if her symptoms persist for further evaluation and treatment.         Final Clinical Impression(s) / ED Diagnoses Final diagnoses:  Dermatitis  Cyst of both lacrimal glands  Pellegrini-Stieda disease, right    Rx / DC Orders ED Discharge Orders          Ordered    nystatin (MYCOSTATIN/NYSTOP) powder  3 times daily        03/08/24 1441              Dolphus Jenny, PA-C 03/08/24 1446    Vanetta Mulders, MD 03/10/24 2012

## 2024-03-09 ENCOUNTER — Telehealth: Payer: Self-pay

## 2024-03-09 NOTE — Telephone Encounter (Signed)
 Patient called in to say the pharmacy never received the prescription for nystatin. Called [facility] on [address] and left a message with script as written. Per request

## 2024-03-11 ENCOUNTER — Ambulatory Visit: Admitting: Physical Therapy

## 2024-03-11 DIAGNOSIS — G8929 Other chronic pain: Secondary | ICD-10-CM | POA: Diagnosis not present

## 2024-03-11 DIAGNOSIS — M6281 Muscle weakness (generalized): Secondary | ICD-10-CM

## 2024-03-11 DIAGNOSIS — M25561 Pain in right knee: Secondary | ICD-10-CM | POA: Diagnosis not present

## 2024-03-11 DIAGNOSIS — R2689 Other abnormalities of gait and mobility: Secondary | ICD-10-CM

## 2024-03-11 DIAGNOSIS — R2681 Unsteadiness on feet: Secondary | ICD-10-CM

## 2024-03-11 NOTE — Therapy (Signed)
 OUTPATIENT PHYSICAL THERAPY PROSTHETICS TREATMENT   Patient Name: Katrina Wolfe MRN: [id number] DOB:June 24, 1966, 58 y.o., female Today's Date: 03/11/2024  PCP: Dr. Tyson Dense REFERRING PROVIDER: Nadara Mustard, MD    END OF SESSION:  PT End of Session - 03/11/24 0833     Visit Number 14    Number of Visits 25   Recert   Date for PT Re-Evaluation 04/22/24    Authorization Type [employer] Medicare/ [employer]    PT Start Time 0800    PT Stop Time 0832    PT Time Calculation (min) 32 min    Equipment Utilized During Treatment Other (comment)   L BKA prosthetic   Activity Tolerance Patient limited by pain    Behavior During Therapy [facility] for tasks assessed/performed                   Past Medical History:  Diagnosis Date   Abnormal uterine bleeding (AUB)    Arthritis    knees, fingers   Asthma    as child, no problem since 1995   Bell's palsy 2004   CKD (chronic kidney disease), stage II    Complication of anesthesia    Congenital cardiomegaly    per pt has always been told since childhood and siblings    Diabetes mellitus without complication (HCC)    Dysrhythmia    Occasionally skipped beats   Elevated liver function tests    GERD (gastroesophageal reflux disease)    History of genital warts    HIV (human immunodeficiency virus infection) (HCC)    DX 1998--  MONITORED BY INFECTIOUS DISEASE-- DR Judyann Munson   Hypercholesterolemia    Hypertension    Intermittent palpitations    mild-- no meds   Legally blind in left eye, as defined in [place]    trauma as child   Peripheral vascular disease (HCC)    Pneumonia    PONV (postoperative nausea and vomiting)    Renal disorder    Tuberculosis    ? 2005 or 2007   Type 2 diabetes mellitus (HCC)    Type II    Uterine fibroid    VIN III (vulvar intraepithelial neoplasia III)    and Verrucoid lesion on the mons   Wears glasses    Past Surgical History:  Procedure Laterality Date   AMPUTATION Left  06/27/2023   Procedure: LEFT BELOW KNEE AMPUTATION;  Surgeon: Nadara Mustard, MD;  Location[facility] OR;  Service: Orthopedics;  Laterality: Left;   ANKLE FUSION Left 02/21/2023   Procedure: REVISION LEFT ANKLE FUSION;  Surgeon: Nadara Mustard, MD;  Location: [facility] OR;  Service: Orthopedics;  Laterality: Left;   CESAREAN SECTION  2001  &  1984   CO2 LASER APPLICATION N/A 11/26/2015   Procedure: CO2 LASER APPLICATION AND WIDE VOCAL EXCSION OF LESION ON MONS PUBIS;  Surgeon: De Blanch, MD;  Location: [facility];  Service: Gynecology;  Laterality: N/A;   CASE CANCELLED    CO2 LASER APPLICATION N/A 01/07/2016   Procedure: CO2 LASER OF VULVAR;  Surgeon: De Blanch, MD;  Location: [facility];  Service: Gynecology;  Laterality: N/A;   CORNEAL TRANSPLANT Left 12/18/2004   failed   D & C HYSTEROSCOPY /  RESECTION FIBROID  12/18/2002   DILATATION & CURETTAGE/HYSTEROSCOPY WITH MYOSURE N/A 10/01/2019   Procedure: DILATATION & CURETTAGE/HYSTEROSCOPY WITH MYOSURE and HYDROTHERMAL ABLATION;  Surgeon: Geryl Rankins, MD;  Location: [facility];  Service: Gynecology;  Laterality:  N/A;  HTA rep will be here. Confirmed on 09/25/19 CS   EYE SURGERY     FOOT ARTHRODESIS Left 11/30/2021   Procedure: LEFT TIBIOCALCANEAL FUSION;  Surgeon: Nadara Mustard, MD;  Location: [facility] OR;  Service: Orthopedics;  Laterality: Left;   HARDWARE REMOVAL Left 04/04/2019   Procedure: EXCISION BONE AND REMOVE DEEP HARDWARE LEFT 5TH METATARSAL;  Surgeon: Nadara Mustard, MD;  Location: [facility] OR;  Service: Orthopedics;  Laterality: Left;   HARDWARE REMOVAL Left 08/04/2022   Procedure: REMOVAL DEEP HARDWARE LEFT ANKLE;  Surgeon: Nadara Mustard, MD;  Location: [facility] OR;  Service: Orthopedics;  Laterality: Left;   I & D EXTREMITY Left 06/27/2021   Procedure: IRRIGATION AND DEBRIDEMENT FOOT ULCER;  Surgeon: Kerrin Champagne, MD;  Location: [facility] OR;  Service: Orthopedics;  Laterality: Left;   KNEE  ARTHROSCOPY W/ ACL RECONSTRUCTION Bilateral 12/18/2006   ORIF TOE FRACTURE Left 08/02/2017   Procedure: OPEN REDUCTION INTERNAL FIXATION (ORIF) FIFTH METATARSAL (TOE) BASE FRACTURE;  Surgeon: Toni Arthurs, MD;  Location: [facility];  Service: Orthopedics;  Laterality: Left;   VULVECTOMY N/A 01/07/2016   Procedure: WIDE LOCAL EXCISION;  Surgeon: De Blanch, MD;  Location: [facility];  Service: Gynecology;  Laterality: N/A;  mons pubis as site   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, with necrosis of bone (HCC)    Diabetes mellitus type 2, uncontrolled, with complications 06/26/2021   Diabetic nephropathy (HCC) 06/26/2021   CKD (chronic kidney disease), stage III (HCC) 06/26/2021   HTN (hypertension) 06/24/2021   HIV (human immunodeficiency virus infection) (HCC) 06/24/2021   DM2 (diabetes mellitus, type 2) (HCC) 06/24/2021   HLD (hyperlipidemia) 06/24/2021   Osteomyelitis of left foot (HCC) 06/24/2021   Elevated LFTs 06/24/2021   Acute-on-chronic kidney injury (HCC) 06/24/2021   Osteomyelitis of foot (HCC) 04/04/2019   Hardware complicating wound infection (HCC)    Subacute osteomyelitis, left ankle and foot (HCC)    Open displaced fracture of fifth metatarsal bone of left foot    Healthcare maintenance 04/03/2019   HIV disease (HCC) 10/27/2015   DM type 2 (diabetes mellitus, type 2) (HCC) 10/27/2015   Hyperlipidemia 10/27/2015   Essential hypertension 10/27/2015   Severe vulvar dysplasia, histologically confirmed 10/15/2015    ONSET DATE: 12/17/2023  REFERRING DIAG: Z61.096 (ICD-10-CM) - S/P BKA (below knee amputation) unilateral, left (HCC)  THERAPY DIAG:   Muscle weakness (generalized)  Unsteadiness on feet  Chronic pain of right knee  Other abnormalities of gait and mobility  Rationale for Evaluation and Treatment: Rehabilitation  SUBJECTIVE:   SUBJECTIVE STATEMENT: Patient presents in WC. Reports she went to the ED over the weekend due to severe knee pain. States her pain is getting worse.   Pt accompanied by: self   PERTINENT HISTORY: HIV, PMH includes: arthritis, asthma, Bell's palsy, CKD II, DM II, HIV, HTN, PVD, blind in L eye, and multiple previous L ankle surgeries, prior R TKA  PAIN:  Are you having pain? Yes: NPRS scale: 8/10  Pain location: RLE Pain description: Aching    PRECAUTIONS: Fall and Other: HIV  RED FLAGS: None   WEIGHT BEARING RESTRICTIONS: No  FALLS: Has patient fallen in last 6 months?  No  LIVING ENVIRONMENT: Lives with: lives with their family and both sons, daughter in Social worker, grandkids Lives in: House/apartment Home Access: Stairs to enter and Ramped entrance Home layout: Two level Stairs:  has stairs but also has ramp entry. Has following equipment at home: Dan Humphreys - 4 wheeled and shower chair  PLOF: Requires assistive device for independence and Needs assistance with gait  PATIENT GOALS: to be able to walk better (pt hasn't walked well since 2018)  OBJECTIVE:  Note: Objective measures were completed at Evaluation unless otherwise noted.  COGNITION: Overall cognitive status: Within functional limits for tasks assessed   LOWER EXTREMITY ROM:  Active ROM Right eval Left eval  Hip flexion    Hip extension    Hip abduction    Hip adduction    Hip internal rotation    Hip external rotation    Knee flexion    Knee extension    Ankle dorsiflexion    Ankle plantarflexion    Ankle inversion    Ankle eversion     (Blank rows = not tested)  LOWER EXTREMITY MMT:  MMT Right eval Left eval  Hip flexion    Hip extension    Hip abduction    Hip adduction    Hip internal rotation     Hip external rotation    Knee flexion    Knee extension    Ankle dorsiflexion    Ankle plantarflexion    Ankle inversion    Ankle eversion    (Blank rows = not tested)  TRANSFERS: Sit to stand: Total A Stand to sit: Total A Lateral scoot transfers: SBA  GAIT: UNABLE  FUNCTIONAL TESTS: Unable to perform any functional tests as patient is non ambulatory at this time    CURRENT PROSTHETIC WEAR ASSESSMENT: Patient is independent with: skin check, residual limb care, prosthetic cleaning, and ply sock cleaning Patient is dependent with: residual limb care, correct ply sock adjustment, proper wear schedule/adjustment, and proper weight-bearing schedule/adjustment Donning prosthesis: SBA Doffing prosthesis: SBA Prosthetic wear tolerance: 3 hours/day, 7 days/week Prosthetic weight bearing tolerance: 0 minutes Edema: present Residual limb condition: dry scabs present, well healed   PATIENT SURVEYS:  LEFS 8/80                                                                                                                            TREATMENT DATE:  Self-Care Discussed updated imaging of R knee and recommendation from ER staff to see Dr. Lajoyce Corners ASAP. At this time, pt's R knee pain is a major limiting factor in PT. Pt able to ambulate up to 11' at a time w/CGA-min A and LLE prosthetic but has been unable to tolerate full weightbearing in several weeks due to severe R knee pain. At this time, plan to pause land PT until pt can see Dr. Lajoyce Corners and will schedule power WC eval for April. Therapist to message Dr. Lajoyce Corners to request earlier appointment as pt does not  see Dr. Lajoyce Corners until June. Pt in agreement with plan.     PATIENT EDUCATION: Education details: See above Person educated: Patient Education method: Explanation Education comprehension: verbalized understanding  HOME EXERCISE PROGRAM: Access Code: [email or web address] URL: [email or web address] Date: 01/17/2024 Prepared by:  Alethia Berthold Rashawnda Gaba  Exercises - Single Leg Bridge  - 1 x daily - 7 x weekly - 3 sets - 10 reps - Clamshell  - 1 x daily - 7 x weekly - 3 sets - 10 reps - Seated knee extension   - 1 x daily - 7 x weekly - 3 sets - 10 reps - Beginner Prone Single Leg Raise  - 1 x daily - 7 x weekly - 1-2 sets - 10-15 reps  Desensitization Techniques: -Light touch/pressure:  using fingertip pressure to slowly move up small segments of the affected body part until outside the area of pain and then back to where you started -Deep pressure:  using fingertips to squeeze in small segments starting outside the affected area, crossing over the affected area, and then to the other side of the affected area -Tapping:  fingertips tap with firm pressure over the affected area, can move around the affected area as well -Brushing/scratching:  use fingertips to brush/scratch over and around the affected area -Textures:  use soft and rough textured items like cotton balls vs wash cloths to brush or rub with light into firm pressure over and around the affected area  Repeat these 3-4x per day.   ASSESSMENT:  CLINICAL IMPRESSION: Session limited due to severe R knee pain. Pt reports she had to go to ED over the weekend due to this severe pain and was told she needed sooner appointment w/Dr. Lajoyce Corners. At this time, will schedule pt's power WC eval and plan to pause land PT until pt can see Dr. Lajoyce Corners regarding R knee. Continue POC.   OBJECTIVE IMPAIRMENTS: Abnormal gait, decreased activity tolerance, decreased balance, decreased endurance, decreased mobility, difficulty walking, decreased strength, hypomobility, increased edema, increased muscle spasms, impaired flexibility, improper body mechanics, postural dysfunction, prosthetic dependency , and pain.   ACTIVITY LIMITATIONS: carrying, lifting, bending, standing, squatting, stairs, transfers, and locomotion level  PARTICIPATION LIMITATIONS: meal prep, cleaning, laundry, driving,  shopping, community activity, and yard work  PERSONAL FACTORS: Time since onset of injury/illness/exacerbation are also affecting patient's functional outcome.   REHAB POTENTIAL: Good  CLINICAL DECISION MAKING: Stable/uncomplicated  EVALUATION COMPLEXITY: Low   GOALS: Goals reviewed with patient? Yes  SHORT TERM GOALS: Target date: 02/07/2024   Patient will tolerate wearing prosthetic for 4 hours/day without discomfort or skin irritation Baseline: 1hour (1/23); 2 hours (2/27) Goal status: IN PROGRESS  2.  Patient will demonstrate proper donning and doffing of the prosthetic independently without any assistance. Baseline: SBA, needs cueing (1/23) Goal status: MET  3.  Patient will be be able to WB on prosthetic for total of 5 min/day to improve functional use. Baseline: unable to stand due to pain in R LE (non amputated Extremity) (1/23) Goal status: IN PROGRESS   4.  Patient will be able to perform stand to sit with max A x 1 with use of RW and be able to perform stand pivot transfers.  Baseline: lateral scoot from chair to bed (SBA) (1/23); sit to stands w/min A  Goal status: IN PROGRESS    LONG TERM GOALS: Target date: 03/06/2024   Patient will be able to ambulate 20 feet with RW and CGA to improve functional ambulation Baseline: hasn't ambulated since 2018 (1/23)  Goal status: IN PROGRESS   2.  Pt will be able to perform stand transer with walker with SBA only Baseline: lateral scoot with SBA (1/23) Goal status: IN PROGRESS   3.  Patient will demo 15% improvement on LEFS to improve functional ability Baseline: 8/80 (1/23) Goal status: IN PROGRESS   4.  Patient will be able to stand for at least 15 min per day in total with use of RW or appropriate AD to improve functional WB Baseline: unable to stand (1/23) Goal status: INITIAL  NEW SHORT TERM GOALS FOR EXTENDED POC:   Target date: 04/01/2024  Pt will perform stand pivot w/RW and min A for improved weightbearing  tolerance on LLE and independence  Baseline:  Goal status: INITIAL  2.  Pt will ambulate 20' w/RW and min A for improved endurance and weightbearing tolerance  Baseline: 29' w/min A  Goal status: INITIAL  3.   Patient will demo 15% improvement on LEFS to improve functional ability Baseline:  8/80 (1/23) Goal status: IN PROGRESS   NEW LONG TERM GOALS FOR EXTENDED POC:  Target date: 04/15/2024  Pt will ambulate 50' w/RW and min A for improved independence and endurance for household distances  Baseline: 72' w/min A  Goal status: INITIAL  2.  Pt will perform stand pivot w/RW and SBA for improved independence and functional mobility   Baseline:  Goal status: INITIAL    PLAN:  PT FREQUENCY: 2x/week  PT DURATION: 8 weeks  PLANNED INTERVENTIONS: 97164- PT Re-evaluation, 97110-Therapeutic exercises, 97530- Therapeutic activity, 97112- Neuromuscular re-education, 97535- Self Care, 16109- Manual therapy, [phone]- Gait training, [phone]- Prosthetic training, Patient/Family education, Balance training, Stair training, Dry Needling, Joint mobilization, Scar mobilization, DME instructions, Wheelchair mobility training, Cryotherapy, and Moist heat  PLAN FOR NEXT SESSION:. Stand pivot. Pt is +2 asssist currently due to patella tendon rupture in bilateral knees. Start w/scifit for improved knee extension ROM. Gait Training w/WC follow    Jill Alexanders Amirra Herling, PT, DPT  03/11/2024, 8:33 AM

## 2024-03-13 ENCOUNTER — Telehealth: Payer: Self-pay | Admitting: Physical Therapy

## 2024-03-13 ENCOUNTER — Ambulatory Visit: Admitting: Physical Therapy

## 2024-03-13 NOTE — Telephone Encounter (Signed)
 Dr. Lajoyce Corners,   I saw this pt in PT on 3/25 and her right knee pain continues to be a significant limiting factor with her mobility and functional use of LLE prosthetic. Pt went to the ED this past weekend due to severe R knee pain and was told she needed to see you sooner than June. Pt is highly motivated and was able to ambulate ~15' w/RW and CGA-min A using LLE prosthetic a few weeks ago, but since then has been significantly limited by R knee pain. Taking a muscle relaxer has improved spasticity of her R quad but nothing seems to ease her joint pain.   Would it be possible to get her in to see you this week?   Thanks,  Jill Alexanders Elease Swarm, PT, DPT

## 2024-03-17 NOTE — Telephone Encounter (Signed)
 Called and sw pt and she is able to come in tomorrow at 2:15

## 2024-03-18 ENCOUNTER — Ambulatory Visit: Admitting: Orthopedic Surgery

## 2024-03-18 ENCOUNTER — Ambulatory Visit: Admitting: Physical Therapy

## 2024-03-18 DIAGNOSIS — M25561 Pain in right knee: Secondary | ICD-10-CM | POA: Diagnosis not present

## 2024-03-18 DIAGNOSIS — Z89512 Acquired absence of left leg below knee: Secondary | ICD-10-CM | POA: Diagnosis not present

## 2024-03-18 DIAGNOSIS — S86811S Strain of other muscle(s) and tendon(s) at lower leg level, right leg, sequela: Secondary | ICD-10-CM | POA: Diagnosis not present

## 2024-03-20 ENCOUNTER — Encounter: Admitting: Physical Therapy

## 2024-03-25 ENCOUNTER — Encounter: Admitting: Physical Therapy

## 2024-03-25 ENCOUNTER — Ambulatory Visit: Admitting: Orthopedic Surgery

## 2024-03-27 ENCOUNTER — Ambulatory Visit: Admitting: Physical Therapy

## 2024-03-27 NOTE — Progress Notes (Signed)
 Office Visit Note   Patient: Katrina Wolfe           Date of Birth: 02-17-1966           MRN: [id number] Visit Date: 03/18/2024              Requested by: Georgann Housekeeper, MD [address] [place],  [place] [address] PCP: Georgann Housekeeper, MD  Chief Complaint  Patient presents with   Right Knee - Pain      HPI: Patient is a 58 year old woman who presents with 3 separate issues.  #1 she is having increasing right knee pain.  She is status post a patella tendon rupture and repair in 2008 in [place].  Patient is status post left below-knee amputation.  Patient states she started developing pain in the right knee in 2022.  Assessment & Plan: Visit Diagnoses:  1. Right knee pain, unspecified chronicity   2. S/P BKA (below knee amputation) unilateral, left (HCC)   3. Rupture of right patellar tendon, sequela     Plan: With the chronic patellar tendon rupture and patella alta do not feel that surgical reconstruction would be successful.  Recommend that she use her knee brace.  Discussed that a knee fusion is an option.  Follow-Up Instructions: No follow-ups on file.   Ortho Exam  Patient is alert, oriented, no adenopathy, well-dressed, normal affect, normal respiratory effort. Examination patient is a stable left transtibial amputation no ulcers.  Examination of the left knee she has patella alta with a palpable defect of the patella tendon with extensor lag of about 45 degrees.  She has pain to palpation of the patellofemoral joint.  Patient currently has a powered wheelchair on order.  Discussed that she will need the power wheelchair for activities of daily living due to the chronic patella tendon rupture and the left transtibial amputation.  Imaging: No results found. No images are attached to the encounter.  Labs: Lab Results  Component Value Date   HGBA1C 7.7 (H) 06/27/2023   HGBA1C 8.5 (H) 02/21/2023   HGBA1C 8.2 (H) 11/30/2021   ESRSEDRATE 53 (H)  08/15/2021   ESRSEDRATE 74 (H) 06/30/2021   CRP 18.5 (H) 08/15/2021   CRP 0.9 06/30/2021   REPTSTATUS 07/03/2021 FINAL 06/27/2021   GRAMSTAIN  06/27/2021    RARE WBC PRESENT,BOTH PMN AND MONONUCLEAR NO ORGANISMS SEEN    CULT  06/27/2021    RARE CORYNEBACTERIUM SPECIES Standardized susceptibility testing for this organism is not available. NO ANAEROBES ISOLATED Performed at [facility], [address], [place], [place] [address]    LABORGA PSEUDOMONAS AERUGINOSA 04/04/2019   LABORGA SERRATIA MARCESCENS 04/04/2019   LABORGA STAPHYLOCOCCUS AUREUS 04/04/2019     Lab Results  Component Value Date   ALBUMIN 3.4 (L) 06/27/2023   ALBUMIN 3.5 08/04/2022   ALBUMIN 2.8 (L) 07/02/2021   PREALBUMIN 32 06/27/2023    Lab Results  Component Value Date   MG 1.6 (L) 07/04/2021   MG 1.5 (L) 07/03/2021   MG 1.6 (L) 07/02/2021   No results found for: "VD25OH"  Lab Results  Component Value Date   PREALBUMIN 32 06/27/2023      Latest Ref Rng & Units 03/08/2024   11:50 AM 06/29/2023    1:50 AM 06/28/2023    3:35 AM  CBC EXTENDED  WBC 4.0 - 10.5 K/uL 10.2  14.8  13.9   RBC 3.87 - 5.11 MIL/uL 4.72  3.87  3.96   Hemoglobin 12.0 -  15.0 g/dL 09.8  11.9  14.7   HCT 36.0 - 46.0 % 47.8  37.4  37.7   Platelets 150 - 400 K/uL 409  311  331      There is no height or weight on file to calculate BMI.  Orders:  No orders of the defined types were placed in this encounter.  No orders of the defined types were placed in this encounter.    Procedures: No procedures performed  Clinical Data: No additional findings.  ROS:  All other systems negative, except as noted in the HPI. Review of Systems  Objective: Vital Signs: LMP 04/27/2021   Specialty Comments:  No specialty comments available.  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/21/2023   S/P  ankle fusion 11/30/2021   Charcot's joint, left ankle and foot    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, with necrosis of bone (HCC)    Diabetes mellitus type 2, uncontrolled, with complications 06/26/2021   Diabetic nephropathy (HCC) 06/26/2021   CKD (chronic kidney disease), stage III (HCC) 06/26/2021   HTN (hypertension) 06/24/2021   HIV (human immunodeficiency virus infection) (HCC) 06/24/2021   DM2 (diabetes mellitus, type 2) (HCC) 06/24/2021   HLD (hyperlipidemia) 06/24/2021   Osteomyelitis of left foot (HCC) 06/24/2021   Elevated LFTs 06/24/2021   Acute-on-chronic kidney injury (HCC) 06/24/2021   Osteomyelitis of foot (HCC) 04/04/2019   Hardware complicating wound infection (HCC)    Subacute osteomyelitis, left ankle and foot (HCC)    Open displaced fracture of fifth metatarsal bone of left foot    Healthcare maintenance 04/03/2019   HIV disease (HCC) 10/27/2015   DM type 2 (diabetes mellitus, type 2) (HCC) 10/27/2015   Hyperlipidemia 10/27/2015   Essential hypertension 10/27/2015   Severe vulvar dysplasia, histologically confirmed 10/15/2015   Past Medical History:  Diagnosis Date   Abnormal uterine bleeding (AUB)    Arthritis    knees, fingers   Asthma    as child, no problem since 1995   Bell's palsy 2004   CKD (chronic kidney disease), stage II    Complication of anesthesia    Congenital cardiomegaly    per pt has always been told since childhood and siblings    Diabetes mellitus without complication (HCC)    Dysrhythmia    Occasionally skipped beats   Elevated liver function tests    GERD (gastroesophageal reflux disease)    History of genital warts    HIV (human immunodeficiency virus infection) (HCC)    DX 1998--  MONITORED BY INFECTIOUS DISEASE-- DR Judyann Munson   Hypercholesterolemia    Hypertension    Intermittent palpitations    mild-- no meds   Legally blind in left eye, as defined in [place]    trauma  as child   Peripheral vascular disease (HCC)    Pneumonia    PONV (postoperative nausea and vomiting)    Renal disorder    Tuberculosis    ? 2005 or 2007   Type 2 diabetes mellitus (HCC)    Type II    Uterine fibroid    VIN III (vulvar intraepithelial neoplasia III)    and Verrucoid lesion on the mons   Wears glasses     Family History  Problem Relation Age of Onset   Hypertension Mother    Diabetes Father    Cancer  Brother    Breast cancer Cousin     Past Surgical History:  Procedure Laterality Date   AMPUTATION Left 06/27/2023   Procedure: LEFT BELOW KNEE AMPUTATION;  Surgeon: Nadara Mustard, MD;  Location: [facility] OR;  Service: Orthopedics;  Laterality: Left;   ANKLE FUSION Left 02/21/2023   Procedure: REVISION LEFT ANKLE FUSION;  Surgeon: Nadara Mustard, MD;  Location: [facility] OR;  Service: Orthopedics;  Laterality: Left;   CESAREAN SECTION  2001  &  1984   CO2 LASER APPLICATION N/A 11/26/2015   Procedure: CO2 LASER APPLICATION AND WIDE VOCAL EXCSION OF LESION ON MONS PUBIS;  Surgeon: De Blanch, MD;  Location: [facility];  Service: Gynecology;  Laterality: N/A;   CASE CANCELLED    CO2 LASER APPLICATION N/A 01/07/2016   Procedure: CO2 LASER OF VULVAR;  Surgeon: De Blanch, MD;  Location: [facility];  Service: Gynecology;  Laterality: N/A;   CORNEAL TRANSPLANT Left 12/18/2004   failed   D & C HYSTEROSCOPY /  RESECTION FIBROID  12/18/2002   DILATATION & CURETTAGE/HYSTEROSCOPY WITH MYOSURE N/A 10/01/2019   Procedure: DILATATION & CURETTAGE/HYSTEROSCOPY WITH MYOSURE and HYDROTHERMAL ABLATION;  Surgeon: Geryl Rankins, MD;  Location: [facility];  Service: Gynecology;  Laterality: N/A;  HTA rep will be here. Confirmed on 09/25/19 CS   EYE SURGERY     FOOT ARTHRODESIS Left 11/30/2021   Procedure: LEFT TIBIOCALCANEAL FUSION;  Surgeon: Nadara Mustard, MD;  Location: [facility] OR;  Service: Orthopedics;  Laterality: Left;   HARDWARE  REMOVAL Left 04/04/2019   Procedure: EXCISION BONE AND REMOVE DEEP HARDWARE LEFT 5TH METATARSAL;  Surgeon: Nadara Mustard, MD;  Location: [facility] OR;  Service: Orthopedics;  Laterality: Left;   HARDWARE REMOVAL Left 08/04/2022   Procedure: REMOVAL DEEP HARDWARE LEFT ANKLE;  Surgeon: Nadara Mustard, MD;  Location: [facility] OR;  Service: Orthopedics;  Laterality: Left;   I & D EXTREMITY Left 06/27/2021   Procedure: IRRIGATION AND DEBRIDEMENT FOOT ULCER;  Surgeon: Kerrin Champagne, MD;  Location: [facility] OR;  Service: Orthopedics;  Laterality: Left;   KNEE ARTHROSCOPY W/ ACL RECONSTRUCTION Bilateral 12/18/2006   ORIF TOE FRACTURE Left 08/02/2017   Procedure: OPEN REDUCTION INTERNAL FIXATION (ORIF) FIFTH METATARSAL (TOE) BASE FRACTURE;  Surgeon: Toni Arthurs, MD;  Location: [facility];  Service: Orthopedics;  Laterality: Left;   VULVECTOMY N/A 01/07/2016   Procedure: WIDE LOCAL EXCISION;  Surgeon: De Blanch, MD;  Location: [facility];  Service: Gynecology;  Laterality: N/A;  mons pubis as site   Social History   Occupational History   Not on file  Tobacco Use   Smoking status: Never   Smokeless tobacco: Never  Vaping Use   Vaping status: Never Used  Substance and Sexual Activity   Alcohol use: No    Alcohol/week: 0.0 standard drinks of alcohol   Drug use: No   Sexual activity: Yes    Partners: Male    Birth control/protection: Condom, Post-menopausal

## 2024-04-01 ENCOUNTER — Ambulatory Visit: Attending: Orthopedic Surgery

## 2024-04-01 ENCOUNTER — Ambulatory Visit: Admitting: Physical Therapy

## 2024-04-01 DIAGNOSIS — G8929 Other chronic pain: Secondary | ICD-10-CM | POA: Diagnosis not present

## 2024-04-01 DIAGNOSIS — Z89512 Acquired absence of left leg below knee: Secondary | ICD-10-CM | POA: Insufficient documentation

## 2024-04-01 DIAGNOSIS — M25561 Pain in right knee: Secondary | ICD-10-CM | POA: Insufficient documentation

## 2024-04-01 DIAGNOSIS — R2689 Other abnormalities of gait and mobility: Secondary | ICD-10-CM | POA: Insufficient documentation

## 2024-04-01 DIAGNOSIS — M6281 Muscle weakness (generalized): Secondary | ICD-10-CM | POA: Insufficient documentation

## 2024-04-01 DIAGNOSIS — R2681 Unsteadiness on feet: Secondary | ICD-10-CM | POA: Diagnosis not present

## 2024-04-01 NOTE — Therapy (Addendum)
 OUTPATIENT PHYSICAL THERAPY WHEELCHAIR EVALUATION   Patient Name: Katrina Wolfe MRN: [id number] DOB:[birth year], 58 y.o., female Today's Date: 04/01/2024  END OF SESSION:  PT End of Session - 04/01/24 0934     Visit Number 1    Number of Visits 1   Recert   Date for PT Re-Evaluation 04/01/24    Authorization Type [employer] Medicare/ [employer]    PT Start Time [id number]    PT Stop Time 1015    PT Time Calculation (min) 40 min    Equipment Utilized During Treatment Other (comment)   L BKA prosthetic   Activity Tolerance Patient limited by pain    Behavior During Therapy WFL for tasks assessed/performed             Past Medical History:  Diagnosis Date   Abnormal uterine bleeding (AUB)    Arthritis    knees, fingers   Asthma    as child, no problem since 1995   Bell's palsy 2004   CKD (chronic kidney disease), stage II    Complication of anesthesia    Congenital cardiomegaly    per pt has always been told since childhood and siblings    Diabetes mellitus without complication (HCC)    Dysrhythmia    Occasionally skipped beats   Elevated liver function tests    GERD (gastroesophageal reflux disease)    History of genital warts    HIV (human immunodeficiency virus infection) (HCC)    DX 1998--  MONITORED BY INFECTIOUS DISEASE-- DR MONTIE BOLOGNA   Hypercholesterolemia    Hypertension    Intermittent palpitations    mild-- no meds   Legally blind in left eye, as defined in [place]     trauma as child   Peripheral vascular disease (HCC)    Pneumonia    PONV (postoperative nausea and vomiting)    Renal disorder    Tuberculosis    ? 2005 or 2007   Type 2 diabetes mellitus (HCC)    Type II    Uterine fibroid    VIN III (vulvar intraepithelial neoplasia III)    and Verrucoid lesion on the mons   Wears glasses    Past Surgical History:  Procedure Laterality Date   AMPUTATION Left 06/27/2023   Procedure: LEFT BELOW KNEE AMPUTATION;  Surgeon: Harden Jerona GAILS, MD;   Location: [facility] OR;  Service: Orthopedics;  Laterality: Left;   ANKLE FUSION Left 02/21/2023   Procedure: REVISION LEFT ANKLE FUSION;  Surgeon: Harden Jerona GAILS, MD;  Location: [facility] OR;  Service: Orthopedics;  Laterality: Left;   CESAREAN SECTION  2001  &  1984   CO2 LASER APPLICATION N/A 11/26/2015   Procedure: CO2 LASER APPLICATION AND WIDE VOCAL EXCSION OF LESION ON MONS PUBIS;  Surgeon: Toribio Percy, MD;  Location: [facility];  Service: Gynecology;  Laterality: N/A;   CASE CANCELLED    CO2 LASER APPLICATION N/A 01/07/2016   Procedure: CO2 LASER OF VULVAR;  Surgeon: Toribio Percy, MD;  Location: [facility];  Service: Gynecology;  Laterality: N/A;   CORNEAL TRANSPLANT Left 12/18/2004   failed   D & C HYSTEROSCOPY /  RESECTION FIBROID  12/18/2002   DILATATION & CURETTAGE/HYSTEROSCOPY WITH MYOSURE N/A 10/01/2019   Procedure: DILATATION & CURETTAGE/HYSTEROSCOPY WITH MYOSURE and HYDROTHERMAL ABLATION;  Surgeon: Timmie Norris, MD;  Location: [facility];  Service: Gynecology;  Laterality: N/A;  HTA rep will be here. Confirmed on 09/25/19 CS   EYE SURGERY  FOOT ARTHRODESIS Left 11/30/2021   Procedure: LEFT TIBIOCALCANEAL FUSION;  Surgeon: Harden Jerona GAILS, MD;  Location: [facility] OR;  Service: Orthopedics;  Laterality: Left;   HARDWARE REMOVAL Left 04/04/2019   Procedure: EXCISION BONE AND REMOVE DEEP HARDWARE LEFT 5TH METATARSAL;  Surgeon: Harden Jerona GAILS, MD;  Location: [facility] OR;  Service: Orthopedics;  Laterality: Left;   HARDWARE REMOVAL Left 08/04/2022   Procedure: REMOVAL DEEP HARDWARE LEFT ANKLE;  Surgeon: Harden Jerona GAILS, MD;  Location: [facility] OR;  Service: Orthopedics;  Laterality: Left;   I & D EXTREMITY Left 06/27/2021   Procedure: IRRIGATION AND DEBRIDEMENT FOOT ULCER;  Surgeon: Lucilla Lynwood BRAVO, MD;  Location: [facility] OR;  Service: Orthopedics;  Laterality: Left;   KNEE ARTHROSCOPY W/ ACL RECONSTRUCTION Bilateral 12/18/2006   ORIF TOE FRACTURE Left  08/02/2017   Procedure: OPEN REDUCTION INTERNAL FIXATION (ORIF) FIFTH METATARSAL (TOE) BASE FRACTURE;  Surgeon: Kit Rush, MD;  Location: [facility];  Service: Orthopedics;  Laterality: Left;   VULVECTOMY N/A 01/07/2016   Procedure: WIDE LOCAL EXCISION;  Surgeon: Toribio Percy, MD;  Location: 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, with necrosis of bone (HCC)    Diabetes mellitus type 2, uncontrolled, with complications 06/26/2021   Diabetic nephropathy (HCC) 06/26/2021   CKD (chronic kidney disease), stage III (HCC) 06/26/2021   HTN (hypertension) 06/24/2021   HIV (human immunodeficiency virus infection) (HCC) 06/24/2021   DM2 (diabetes mellitus, type 2) (HCC) 06/24/2021   HLD (hyperlipidemia) 06/24/2021   Osteomyelitis of left foot (HCC) 06/24/2021   Elevated LFTs 06/24/2021   Acute-on-chronic kidney injury (HCC) 06/24/2021   Osteomyelitis of foot (HCC) 04/04/2019   Hardware complicating wound infection (HCC)    Subacute osteomyelitis, left ankle and foot (HCC)    Open displaced fracture of fifth metatarsal bone of left foot    Healthcare maintenance 04/03/2019   HIV disease (HCC) 10/27/2015   DM type 2 (diabetes mellitus, type 2) (HCC) 10/27/2015   Hyperlipidemia 10/27/2015   Essential hypertension 10/27/2015   Severe vulvar dysplasia, histologically confirmed 10/15/2015    PCP: Dr. Ardell Seen  REFERRING PROVIDER: Dr. Jerona Harden  THERAPY DIAG:  Unsteadiness on feet  Muscle weakness (generalized)  Chronic pain of right knee  Other abnormalities of gait and  mobility  Rationale for Evaluation and Treatment Rehabilitation  SUBJECTIVE:  SUBJECTIVE STATEMENT: Pt presents for wheelchair evaluation. L BKA 06/27/2023. Received prosthetic leg in 09/2023. Pt had  L ankle surgery in 2018 to to repair  the leg. Surgery failed  where she couldn't walk on her left leg and she was rolling her foot and walking on ankle. Pt had revision and ultimately decided to do BKA in 2024. Patient also slipped on ice in 2007 and tore the tendons and ligaments in both sides of the knee. She did surgery in 2008. She was able to walk with walker for little bit for short distances and was using power chair when she was in [place]. Patient recently had a meeting with her orthopedic surgeon who reported that she has completely torn her patella tendon and it is not repairable at this point.  Pt reports of intermittent muscle spasms in R leg from her pain.Pt reports of lower back pain from prolonged sitting and not having adequate seating or having proper back support.  Pt is also leally blind in L eye.  PRECAUTIONS: Fall  RED FLAGS: None  WEIGHT BEARING RESTRICTIONS No    OCCUPATION: disability  PLOF:  Requires assistive device for independence, Needs assistance with ADLs, Needs assistance with homemaking, and Needs assistance with gait  PATIENT GOALS: to obtain power mobility device to improve function at home          MEDICAL HISTORY:  Primary diagnosis onset: 06/27/23 - DOS L BKA     Medical Diagnosis with ICD-10 code: Z89.512 (ICD-10-CM) - S/P BKA (below knee amputation) unilateral, left (HCC)   [] Progressive disease  Relevant future surgeries:     Height: 5' 9 Weight: 262 lbs Explain recent changes or trends in weight:      History:  Past Medical History:  Diagnosis Date    Abnormal uterine bleeding (AUB)    Arthritis    knees, fingers   Asthma    as child, no problem since 1995   Bell's palsy 2004   CKD (chronic kidney disease), stage II    Complication of anesthesia    Congenital cardiomegaly    per pt has always been told since childhood and siblings    Diabetes mellitus without complication (HCC)    Dysrhythmia    Occasionally skipped beats   Elevated liver function tests    GERD (gastroesophageal reflux disease)    History of genital warts    HIV (human immunodeficiency virus infection) (HCC)    DX 1998--  MONITORED BY INFECTIOUS DISEASE-- DR MONTIE BOLOGNA   Hypercholesterolemia    Hypertension    Intermittent palpitations    mild-- no meds   Legally blind in left [place], [place] defined in [place]     trauma as child   Peripheral vascular disease (HCC)    Pneumonia    PONV (postoperative nausea and vomiting)    Renal disorder    Tuberculosis    ? 2005 or 2007   Type 2 diabetes mellitus (HCC)    Type II    Uterine fibroid    VIN III (vulvar intraepithelial neoplasia III)    and Verrucoid lesion on the mons   Wears glasses        Cardio Status:  Functional Limitations:   [x] Intact  []  Impaired      Respiratory Status:  Functional Limitations:   [x] Intact  [] Impaired   [] SOB [] COPD [] O2 Dependent ______LPM  [] Ventilator Dependent  Resp equip:  Objective Measure(s):   Orthotics:   [] Amputee:                                                             [] Prosthesis:        HOME ENVIRONMENT:  [x] House [] Condo/town home [] Apartment [] Asst living [] LTCF         [] Own  [x] Rent   [] Lives alone [x] Lives with others -        2 sons, daughter in law, 3 grandkids                     Hours without assistance: 8 hours  [x] Home is accessible to patient                                 Storage of wheelchair:  [x] In home   [] Other Comments:        COMMUNITY :  TRANSPORTATION:  [] Car [] Secretary/administrator [] Adapted w/c Lift []  Ambulance [] Other:                     [] Sits in wheelchair during transport   Where is w/c stored during transport?  [] T[employer]s  []  [employer]  r   [] Self-Driver       Drive while in  Biomedical scientist [] yes [x] no   Employment and/or school:  Specific requirements pertaining to mobility        Other:  COMMUNICATION:  Verbal Communication  [x] WFL [] receptive [] WFL [] expressive [] Understandable  [] Difficult to understand  [] non-communicative  Primary Language:__English____________ 2nd:_____________  Communication provided by:[x] Patient [] Family [] Caregiver [] Translator   [] Uses an augmentative communication device     Manufacturer/Model :                                                                MOBILITY/BALANCE:  Sitting Balance  Standing Balance  Transfers  Ambulation   [x] WFL      [] WFL  [] Independent  []  Independent   [] Uses UE for balance in sitting Comments:  [] Uses UE/device for stability Comments:  [x]  Min assist  []  Ambulates independently with       device:___________________      []  Mod assist  []  Able to ambulate ______ feet        safely/functionally/independently   []  Min assist  []  Min assist  []  Max assist  []  Non-functional ambulator         History/High risk of falls   []  Mod assist  []  Mod assist  []  Dependent  [x]  Unable to ambulate   []  Max  assist  []  Max assist  Transfer method:[] 1 person [] 2 person [] sliding board [] squat pivot [] stand pivot [] mechanical patient lift  [x] other: scooting  []  Unable  [x]  Unable    Fall History: # of falls in the past 6 months? 0 # of "near" falls in the past 6 months? 0    CURRENT SEATING / MOBILITY:  Current Mobility Device: [] None [] Cane/Walker [x] Manual [] Dependent [] Dependent w/ Tilt rScooter  [] Power (  type of control): Pt had purchased a manual wheelchair from [employer] in 2020 but it got rusted and frame of the wheelchair broke. Patient currently using a manual wheelchair that was bought by  her family.   Manufacturer: unsure Model:  Serial #:   Size:  Color:  Age: 68+  Purchased by whom: Stalls?  Current condition of mobility base:  manual  Current seating system:                    none                                                   Age of seating system:  5+  Describe posture in present seating system: Pt currently is propelling wheelchair with bil UE only. She is not able to propel the wheelchair with R LE due to significant amount of pain. Manual wheelchair mobility is limiting with bil UE due to R shoulder pain 7/10 and L shoulder pain 6/10.   Is the current mobility meeting medical necessity?:  [] Yes [x] No Describe: see above                                    Ability to complete Mobility-Related Activities of Daily Living (MRADL's) with Current Mobility Device:   Move room to room  [x] Independent  [] Min [] Mod [] Max assist  [] Unable  Comments:   Meal prep  [x] Independent  [] Min [] Mod [] Max assist  [] Unable    Feeding  [x] Independent  [] Min [] Mod [] Max assist  [] Unable    Bathing  [x] Independent  [] Min [] Mod [] Max assist  [] Unable    Grooming  [x] Independent  [] Min [] Mod [] Max assist  [] Unable    UE dressing  [x] Independent  [] Min [] Mod [] Max assist  [] Unable    LE dressing  [x] Independent   [] Min [] Mod [] Max assist  [] Unable    Toileting  [x] Independent  [] Min [] Mod [] Max assist  [] Unable    Bowel Mgt: [x]  Continent []  Incontinent []  Accidents []  Diapers []  Colostomy []  Bowel Program:  Bladder Mgt: [x]  Continent []  Incontinent []  Accidents []  Diapers []  Urinal []  Intermittent Cath []  Indwelling Cath []  Supra-pubic Cath     Current Mobility Equipment Trialed/ Ruled Out:    Does not meet mobility needs due to:    Mark all boxes that indicate inability to use the specific equipment listed     Meets needs for safe  independent functional  ambulation  / mobility    Risk of  Falling or History of Falls    Enviromental limitations      Cognition    Safety concerns  with  physical ability    Decreased / limitations endurance  & strength     Decreased / limitations  motor skills  & coordination    Pain    Pace /  Speed    Cardiac and/or  respiratory condition    Contra - indicated by diagnosis   Cane/Crutches  []   []   []   []   []   []   []   []   []   []   [x]    Walker / Rollator  []  NA   []   [x]   []   []   [x]   [x]   [x]   [x]   []   []   [x]   Manual Wheelchair [id number]:  []  NA  []   []   [x]   []   []   [x]   [x]   [x]   [x]   []   []    Manual W/C (K0005) with power assist  [x]  NA  []   []   []   []   []   []   []   []   []   []   []    Scooter  [x]  NA  []   []   []   []   []   []   []   []   []   []   []    Power Wheelchair: standard joystick  []  NA  [x]   []   []   []   []   []   []   []   []   []   []    Power Wheelchair: alternative controls  []  NA  []   []   []   []   []   []   []   []   []   []   []    Summary:  The least costly alternative for independent functional mobility was found to be:    []  Crutch/Cane  []  Walker []  Manual w/c  []  Manual w/c with power assist   []  Scooter   [x]  Power w/c std joystick   []  Power w/c alternative control        []  Requires dependent care mobility device   Cabin crew for [employer] skills are adequate for safe mobility equipment operation  [x]   Yes []   No  Patient is willing and motivated to use recommended mobility equipment  [x]   Yes []   No       []  Patient is unable to safely operate mobility equipment independently and requires dependent care equipment Comments:           SENSATION and SKIN ISSUES:  Sensation [x]  Intact  []  Impaired []  Absent []  Hyposensate []  Hypersensate  []  Defensiveness  Location(s) of impairment:    Pressure Relief Method(s):  [x]  Lean side to side to offload (without risk of falling)  []   W/C push up (4+ times/hour for 15+ seconds) []  Stand up (without risk of falling)    []  Other: (Describe): Effective pressure relief method(s) above can be performed consistently throughout the day:  [] Yes  [x]  No If not, Why?: unable to perform w/c push ups for required time for effective pressure relief method.  Skin Integrity Risk:       []  Low risk           [x]  Moderate risk            []  High risk  If high risk, explain:   Skin Issues/Skin Integrity  Current skin Issues  []  Yes [x]  No [x]  Intact  []   Red area   []   Open area  []  Scar tissue  []  At risk from prolonged sitting  Where: History of Skin Issues  [x]  Yes []  No Where : L proximal posterior thigh When: June 2020 Stage: stage 1-2 Hx of skin flap surgeries  []  Yes [x]  No Where:  When:  Pain: [x]  Yes []  No   Pain Location(s):  R knee pain (10/10- with transfers and WB), R shoulder pain 7/10, L shoulder pain 6/10 with propulsion of wheelchair, back pain Intensity scale: (0-10) :7-10/10 How does pain interfere with mobility and/or MRADLs? - Limited use of bil UE to propel manual wheelchair due to shoulder pain, unable to stand/ambulate/perform functional stand/pivot transfers due to limited strength and pain in R knee.        MAT EVALUATION:  Neuro-Muscular Status: (Tone, Reflexive, Responses, etc.)     [  x]  Intact   []  Spasticity:  []  Hypotonicity  []  Fluctuating  []  Muscle Spasms  []  Poor Righting Reactions/Poor Equilibrium Reactions  []  Primal Reflex(s):    Comments:            COMMENTS:    POSTURE:     Comments:  Pelvis Anterior/Posterior:  [x]  Neutral   []  Posterior  []  Anterior  []  Fixed - No movement []  Tendency away from neutral []  Flexible []  Self-correction []  External correction Obliquity (viewed from front)  [x]  WFL []  R Obliquity []  L Obliquity  []  Fixed - No movement []  Tendency away from neutral []  Flexible []  Self-correction []  External correction Rotation  [x]  WFL []  R anterior []  L anterior  []  Fixed - No movement []  Tendency away from neutral []  Flexible []  Self-correction []  External correction Tonal Influence Pelvis:  [x]  Normal []  Flaccid []  Low tone []   Spasticity []  Dystonia []  Pelvis thrust []  Other:    Trunk Anterior/Posterior:  [x]  WFL []  Thoracic kyphosis []  Lumbar lordosis  []  Fixed - No movement []  Tendency away from neutral []  Flexible []  Self-correction []  External correction  [x]  WFL []  Convex to left  []  Convex to right []  S-curve   []  C-curve []  Multiple curves []  Tendency away from neutral []  Flexible []  Self-correction []  External correction Rotation of shoulders and upper trunk:  [x]  Neutral []  Left-anterior []  Right- anterior []  Fixed- no movement []  Tendency away from neutral []  Flexible []  Self correction []  External correction Tonal influence Trunk:  [x]  Normal []  Flaccid []  Low tone []  Spasticity []  Dystonia []  Other:   Head & Neck  [x]  Functional []  Flexed    []  Extended []  Rotated right  []  Rotated left []  Laterally flexed right []  Laterally flexed left []  Cervical hyperextension   [x]  Good head control []  Adequate head control []  Limited head control []  Absent head control Describe tone/movement of head and neck:      Lower Extremity Measurements: LE MMT:  MMT Right 04/01/2024 Left 04/01/2024  Hip flexion 3+ 4  Hip extension    Hip abduction 3+ 4  Hip adduction    Knee flexion 3+ 5  Knee extension 2- 5  Ankle dorsiflexion 3+ na  Ankle plantarflexion     (Blank rows = not tested)  LE ROM:  AROM Right 04/01/2024 Left 04/01/2024  Hip flexion    Hip extension    Hip abduction    Hip adduction    Knee flexion WNL WNL  Knee extension Lacks -45 deg to full extension WNL  Ankle dorsiflexion    Ankle plantarflexion     (Blank rows = not tested)  Hip positions:  [x]  Neutral   []  Abducted   []  Adducted  []  Subluxed   []  Dislocated   []  Fixed   []  Tendency away from neutral [x]  Flexible [x]  Self-correction []  External correction   Hip Windswept:[x]  Neutral  []  Right    []  Left  []  Subluxed   []  Dislocated   []  Fixed   []  Tendency away from neutral [x]   Flexible [x]  Self-correction []  External correction  LE Tone: [x]  Normal []  Low tone []  Spasticity []  Flaccid []  Dystonia []  Rocks/Extends at hip []  Thrust into knee extension []  Pushes legs downward into footrest  Foot positioning: ROM Concerns: Dorsiflexed: []  Right   []  Left Plantar flexed: []  Right    []  Left Inversion: []  Right    []  Left Eversion: []  Right    []   Left  LE Edema: []  1+ (Barely detectable impression when finger is pressed into skin) [x]  2+ (slight indentation. 15 seconds to rebound) - In R foot/ankle, L residual leg []  3+ (deeper indentation. 30 seconds to rebound) []  4+ (>30 seconds to rebound)  UE Measurements:  UPPER EXTREMITY ROM: WNL with pain in R shoulder with end range flexion and internal rotation  Active ROM Right 04/01/2024 Left 04/01/2024  Shoulder flexion    Shoulder abduction    Shoulder adduction    Elbow flexion    Elbow extension    Wrist flexion    Wrist extension    (Blank rows = not tested)  UPPER EXTREMITY MMT:  MMT Right 04/01/2024 Left 04/01/2024  Shoulder flexion 3+ 4+  Shoulder abduction 3+ 4+  Shoulder adduction    Elbow flexion 5 5  Elbow extension 5 5  Wrist flexion    Wrist extension    Pinch strength    Grip strength 52 lbs 57 lbs  (Blank rows = not tested)  Shoulder Posture:  Right Tendency towards Left  [x]   Functional [x]    []   Elevation []    []   Depression []    []   Protraction []    []   Retraction []    []   Internal rotation []    []   External rotation []    []   Subluxed []     UE Tone: [x]  Normal []  Flaccid []  Low tone []  Spasticity  []  Dystonia []  Other:   UE Edema: [x]  1+ (Barely detectable impression when finger is pressed into skin) []  2+ (slight indentation. 15 seconds to rebound) []  3+ (deeper indentation. 30 seconds to rebound) []  4+ (>30 seconds to rebound)  Wrist/Hand: Handedness: [x]  Right   []  Left   []  NA: Comments:  Right  Left  [x]   WNL [x]    []   Limitations []    []    Contractures []    []   Fisting []    []   Tremors []    []   Weak grasp []    []   Poor dexterity []    []   Hand movement non functional []    []   Paralysis []         MOBILITY BASE RECOMMENDATIONS and JUSTIFICATION:  MOBILITY BASE  JUSTIFICATION   Manufacturer:    Model:                              Color:  Seat Width:   Seat Depth    []  Manual mobility base (continue below)   []  Scooter/POV  [x]  Power mobility base   Number of hours per day spent in above selected mobility base: 16+  Typical daily mobility base use Schedule: during the awake hours of the day to move from room to room, perform transfers, COOK, ADLs   [x]  is not a safe, functional ambulator  [x]  limitation prevents from completing a MRADL(s) within a reasonable time frame    [x]  limitation places at high risk of morbidity or mortality secondary to  the attempts to perform a    MRADL(s)  []  limitation prevents accomplishing a MRADL(s) entirely  [x]  provide independent mobility  [x]  equipment is a lifetime medical need  [x]  walker or cane inadequate  [x]  any type manual wheelchair      inadequate  [x]  scooter/POV inadequate      []  requires dependent mobility          MANUAL MOBILITY      []  Standard manual  wheelchair  K0001      Arm:    []  both []  right  []  left      Foot:   []  both []  right   []  left  []  self-propels wheelchair  []  will use on regular basis  []  chair fits throughout home  []  willing and motivated to use  []  propels with assistance     []  dependent use   []  Standard hemi-manual wheelchair  K0002      Arm:    []  both []  right  []  left      Foot:   []  both []  right   []  left  []  lower seat height required to foot propel  []  short stature  []  self-propels wheelchair  []  will use on regular basis  []  chair fits throughout home  []  willing and motivated to use   []  propels with assistance  []  dependent use   []  Lightweight manual wheelchair  K0003      Arm:    []  both []  right  []   left      Foot:   []  both  []  right  []  left                   []  hemi height required  []  medical condition and weight of  wheelchair affect ability to self      propel standard manual wheelchair in the residence  []  can and does self-propel (marginal propulsion skills)  []  daily use _________hours  []  chair fits throughout home  []  willing and motivated to use  []  lower seat height required to foot propel  []  short stature   []  High strength lightweight manual  wheelchair (B[employer]zy Ultra 4)  K0004     Arm:    []  both []  right  []  left     Foot:   []  both []  right   []  left                                                                  []  hemi height required []  medical condition and weight of wheelchair affect ability to self propel while engaging in frequent MRADL(s) that cannot be performed in a standard or lightweight manual wheelchair  []  daily use _________hours  []  chair fits throughout home  []  willing and motivated to use  []  prevent repetitive use injuries   []  lower seat height required to foot propel  []  short stature    []  Ultra-lightweight manual wheelchair  K0005     Arm:    []  both []  right  []  left     Foot:   []  both []  right  []  left       []  hemi height required  []  heavy duty    Front seat to floor _____ inches      Rear seat to floor _____ inches      Back height _____ inches     Back angle ______ degrees      Front angle _____ degrees  []   full-time manual wheelchair user  []  Requires individualized fitting and optimal adjustments for multiple features that include adjustable axle configuration, fully adjustable center of gravity, wheel camber, seat and back angle, angle of seat  slope, which cannot be accommodated by a K0001 through K0004 manual wheelchair  []  prevent repetitive use injuries  []  daily use_________hours   []  user has high activity patterns that frequently require  them  to go out into the community for the purpose of independently  accomplishing high level MRADL activities. Examples of these might include a combination of; shopping, work, school, Photographer, childcare, independently loading and unloading from a vehicle etc.  []  lower seat height required to foot propel  []  short stature  []  heavy duty -  weight over 250lbs   []  Current chair is a K0005   manufacture:___________________  model:_________________  serial#____________________  age:_________    []  First time [id number] user (complete trial)  K0004 time and # of strokes to propel 30 feet: ________seconds _________strokes  X9994 time and # of strokes to propel 30 feet: ________seconds _________strokes  What was the result of the trial between the K0004 and K0005 manual wheelchair? ___    What features of the K0005 w/c are needed as compared to the K0004 base? Why?___    []  adjustable seat and back angle changes the angle of seat slope of the frame to attain a gravity assisted position for efficient propulsion and proper weight distribution along the frame     []  the front of the wheelchair will be configured higher than the back of the chair to allow gravity to assist the user with postural stability  []  the center of the wheel will be positioned for stability, safety and efficient propulsion  []  adjustable axle allows for vertical, horizontal, camber and overall width changes  throughout the wheels for adjustment of the client's exact needs and abilities.   []  adjustable axle increases the stability and function of the chair allowing for adjustment of the center of gravity.   []  accommodates the client's anatomical position in the chair maximizing independence in mobility and maneuverability in all environments.   []  create a minimal fixed tilt-in space to assist in positioning.   []  Describe users full-time manual wheelchair activity patterns:___    []  Power assist Comments:  []  prevent repetitive use injuries  []  repetitive strain injury present in     shoulder girdle    []  shoulder pain is (> or =) to 7/10     during manual propulsion       Current Pain _____/10  []  requires conservation of energy to participate in MRADL(s) runable to propel up ramps or curbs using manual wheelchair  []  been K0005 user greater than one year  []  user unwilling to use power      wheelchair (reason): []  less expensive option to power   wheelchair   []  rim activated power assist -      decreased strength   []  Heavy duty manual wheelchair       K0006     Arm:    []  both []  right  []  left     Foot:   []  both []  right  []  left     []  hemi height required    []  Dependent base  []  user exceeds 250lbs  []  non-functional ambulator    []  extreme spasticity  []  over active movement   []  broken frame/hx of repeated     repairs  []  able to self-propel in residence       []  lower seat to floor height required  []  unable to self-propel in residence   []  Extra heavy duty manual wheelchair  [id number]  Arm:    []  both []  right  []  left     Foot:   []  both []  right  []  left     []  hemi height required  []  Dependent base  []  user exceeds 300lbs  []  non-functional ambulator    []  able to self-propel in residence   []  lower seat to floor height required  []  unable to self-propel in residence     []  Manual wheelchair with tilt [phone]      (Manual "Tilt-n-Space")  []  patient is dependent for transfers  []  patient requires frequent       positioning for pressure relief   []  patient requires frequent      positioning for poor/absent trunk control        []  Stroller Base  []  infant/child   []  unable to propel manual      wheelchair  []  allows for growth  []  non-functional ambulator  []  non-functional UE  []  independent mobility is not a goal at this time    MANUAL FRAME OPTIONS      Push handles  []  extended   []  angle adjustable   []  standard  []  caregiver access  []  caregiver assist    []  allows "hooking" to enable      increased ability to perform ADLs or  maintain balance   []  Angle Adjustable Back  []  postural control  []  control of tone/spasticity  []  accommodation of range of motion  []  UE functional control  []  accommodation for seating system    Rear wheel placement  []  std/fixed  [] fully adjustableramputee   []  camber ________degree  []  removable rear wheel  []  non-removable rear wheel  Wheel size _______  Wheel style_______________________  []  improved UE access to wheels  []  increase propulsion ability  []  improved stability  []  changing angle in space for      improvement of postural stability  []  remove for transport    []  allow for seating system to fit on  base  []  amputee placement  []  1-arm drive access   r R  r L  []  enable propulsion of manual       wheelchair with one arm    []  amputee placement   Wheel rims/ Hand rims  []  Standard    []  Specialized-____ []  provide ability to propel manual   []  increase self-propulsion with hand wheelchair weakness/decreased grasp     []  Spoke protector/guard   []  prevent hands from getting caught in spokes   Tires:  []  pneumatic  []  flat free inserts  []  solid  Style:  []  decrease roll resistance              []  prevent frequent flats  []  increase shock absorbency  []  decrease maintenance   []  decrease pain from road shock    []  decrease spasms from road shock    Wheel Locks:    []  push []  pull []  scissor  []  lock wheels for transfers  []  lock wheels from rolling   Brake/wheel lock extension:  []  R  []  L  []  allow user to operate wheel locks due to decreased reach or strength   Caster housing:  Caster size:                      Style:                                          []   suspension fork  []  maneuverability   []  stability of wheelchair   []  durability  []  maintenance  []  angle adjustment for posture  []  allow for feet to come under        wheelchair base  []  allows change in seat to floor  height   []  increase shock absorbency  []  decrease pain from road  shock  []  decrease spasms from road    shock   []  Side guards  []  prevent clothing getting caught in wheel or becoming soiled   [] provide hip and pelvic stability  []  eliminates contact between body and wheels  []  limit hand contact with wheels   []  Anti-tippers      []  prevent wheelchair from tipping    backward  []  assist caregiver with curbs     POWER MOBILITY      []  Scooter/POV    []  can safely operate   []  can safely transfer   []  has adequate trunk stability   []  cannot functionally propel  manual wheelchair    [x]  Power mobility base    [x]  non-ambulatory   [x]  cannot functionally propel manual wheelchair   [x]  cannot functionally and safely      operate scooter/POV  [x]  can safely operate power       wheelchair  [x]  home is accessible  [x]  willing to use power wheelchair     Tilt  [x]  Powered tilt on powered chair  []  Powered tilt on manual chair  []  Manual tilt on manual chair Comments:  [x]  change position for pressure      []  Relief/cannot weight shift   [x]  change position against      gravitational force on head and      shoulders   [x]  decrease pain  []  blood pressure management   []  control autonomic dysreflexia  []  decrease respiratory distress  []  management of spasticity  []  management of low tone  [x]  facilitate postural control   [x]  rest periods   [x]  control edema  []  increase sitting tolerance   []  aid with transfers     Recline   []  Power recline on power chair  []  Manual recline on manual chair  Comments:    []  intermittent catheterization  []  manage spasticity  []  accommodate femur to back angle  []  change position for pressure relief/cannot weight shift rhigh risk of pressure sore development  []  tilt alone does not accomplish     effective pressure relief, maximum pressure relief achieved at -      _______ degrees tilt   _______ degrees recline   []  difficult to transfer to and from bed []  rest periods and sleeping in chair  []   repositioning for transfers  []  bring to full recline for ADL care  []  clothing/diaper changes in chair  []  gravity PEG tube feeding  []  head positioning  []  decrease pain  []  blood pressure management   []  control autonomic dysreflexia  []  decrease respiratory distress  []  user on ventilator     Elevator on mobility base  [x]  Power wheelchair  []  Scooter  [x]  increase Indep in transfers   [x]  increase Indep in ADLs    [x]  bathroom function and safety  [x]  kitchen/cooking function and safety  [x]  shopping  [x]  raise height for communication at standing level  [x]  raise height for eye contact which reduces cervical neck strain and pain  [x]  drive at raised height for safety and navigating crowds  []   Other:   []  Vertical position system  (anterior tilt)     (Drive locks-out)    []  Stand       (Drive enabled)  []  independent weight bearing  []  decrease joint contractures  []  decrease/manage spasticity  []  decrease/manage spasms  []  pressure distribution away from   scapula, sacrum, coccyx, and ischial tuberosity  []  increase digestion and elimination   []  access to counters and cabinets  []  increase reach  []  increase interaction with others at eye level, reduces neck strain  []  increase performance of       MRADL(s)      Power elevating legrest    []  Center mount (Single) 85-170 degrees       []  Standard (Pair) 100-170 degrees  []  position legs at 90 degrees, not available with std power ELR  []  center mount tucks into chair to decrease turning radius in home, not available with std power ELR  []  provide change in position for LE  []  elevate legs during recline    []  maintain placement of feet on      footplate  []  decrease edema  []  improve circulation  []  actuator needed to elevate legrest  []  actuator needed to articulate legrest preventing knees from flexing  []  Increase ground clearance over      curbs  []   STD (pair) independently                     elevate  legrest   POWER WHEELCHAIR CONTROLS      Controls/input device  []  Expandable  [x]  Non-expandable  []  Proportional  [x]  Right Hand []  Left Hand  []  Non-proportional/switches/head-array  []  Electrical/proximity         []   Mechanical      Manufacturer:___________________   Type:________________________ [x]  provides access for controlling wheelchair  [x]  programming for accurate control  []  progressive disease/changing condition  []  required for alternative drive      controls       []  lacks motor control to operate  proportional drive control  []  unable to understand proportional controls  []  limited movement/strength  []  extraneous movement / tremors / ataxic / spastic       []  Upgraded electronics controller/harness    []  Single power (tilt or recline)   []  Expandable    []  Non-expandable plus   []  Multi-power (tilt, recline, power legrest, power seat lift, vertical positioning system, stand)  []  allows input device to communicate with drive motors  []  harness provides necessary connections between the controller, input device, and seat functions     []  needed in order to operate power seat functions through joystick/ input device  []  required for alternative drive controls     []  Enhanced display  []  required to connect all alternative drive controls   []  required for upgraded joystick      (lite-throw, heavy duty, micro)  []  Allows user to see in which mode and drive the wheelchair is set; necessary for alternate controls       []  Upgraded tracking electronics  []  correct tracking when on uneven surfaces makes switch driving more efficient and less fatiguing  []  increase safety when driving  []  increase ability to traverse thresholds    []  Safety / reset / mode switches     Type:    []  Used to change modes and stop the wheelchair when driving     [x]  [facility] for joystick / input  device/switches  [x]  swing away for access or transfers   [x]  attaches joystick / input device /  switches to wheelchair   [x]  provides for consistent access  [x]  midline for optimal placement    []  Attendant controlled joystick plus     mount  []  safety  []  long distance driving  []  operation of seat functions  []  compliance with transportation regulations    [x]  Battery x2 [x]  required to power (power assist / scooter/ power wc / other):   []  Power inverter (24V to 12V)  []  required for ventilator / respiratory equipment / other:     CHAIR OPTIONS MANUAL & POWER      Armrests   [x]  adjustable height []  removable  []  swing away []  fixed  [x]  flip back  []  reclining  [x]  full length pads []  desk []  tube arms []  gel pads  [x]  provide support with elbow at 90    [x]  remove/flip back/swing away for  transfers  [x]  provide support and positioning of upper body    [x]  allow to come closer to table top  [x]  remove for access to tables  []  provide support for w/c tray  [x]  change of height/angles for variable activities   []  Elbow support / Elbow stop  []  keep elbow positioned on arm pad  []  keep arms from falling off arm pad  during tilt and/or recline   Upper Extremity Support  []  Arm trough  []   R  []   L  Style:  []  swivel mount []  fixed mount   []  posterior hand support  []   tray  []  full tray  []  joystick cut out  []   R  []   L  Style:  []  decrease gravitational pull on      shoulders  []  provide support to increase UE  function  []  provide hand support in natural    position  []  position flaccid UE  []  decrease subluxation    []  decrease edema       []  manage spasticity   []  provide midline positioning  []  provide work surface  []  placement for AAC/ Computer/ EADL       Hangers/ Legrests   []  ______ degree  []  Elevating []  articulating  []  swing away []  fixed []  lift off  []  heavy duty  []  adjustable knee angle  []  adjustable calf panel   []  longer extension tube              []  provide LE support  []  maintain placement of feet on      footplate   []  accommodate  lower leg length  []  accommodate to hamstring       tightness  []  enable transfers  []  provide change in position for LE's  []  elevate legs during recline    []  decrease edema  []  durability      Foot support   [x]  footplate []  R []  L [x]  flip up           []  Depth adjustable   []  angle adjustable  []  foot board/one piece    [x]  provide foot support  []  accommodate to ankle ROM  [x]  allow foot to go under wheelchair base  [x]  enable transfers     []  Shoe holders  []  position foot    []  decrease / manage spasticity  []  control position of LE  []  stability    []  safety     []   Ankle strap/heel      loops  []  support foot on foot support  []  decrease extraneous movement  []  provide input to heel   []  protect foot     []  Amputee adapter []  R  []  L     Style:                  Size:  []  Provide support for stump/residual extremity    []  Transportation tie-down  []  to provide crash tested tie-down brackets    []  Crutch/cane holder    []  O2 holder    []  IV hanger   []  Ventilator tray/mount    []  stabilize accessory on wheelchair       Component  Justification     []  Seat cushion      []  accommodate impaired sensation  []  decubitus ulcers present or history  []  unable to shift weight  []  increase pressure distribution  []  prevent pelvic extension  []  custom required "off-the-shelf"    seat cushion will not accommodate deformity  []  stabilize/promote pelvis alignment  []  stabilize/promote femur alignment  []  accommodate obliquity  []  accommodate multiple deformity  []  incontinent/accidents  []  low maintenance     []  seat mounts                 []  fixed []  removable  []  attach seat platform/cushion to wheelchair frame    []  Seat wedge    []  provide increased aggressiveness of seat shape to decrease sliding  down in the seat  []  accommodate ROM        []  Cover replacement   []  protect back or seat cushion  []  incontinent/accidents    []  Solid seat / insert    []  support  cushion to prevent      hammocking  []  allows attachment of cushion to mobility base    []  Lateral pelvic/thigh/hip     support (Guides)     []  decrease abduction  []  accommodate pelvis  []  position upper legs  []  accommodate spasticity  []  removable for transfers     []  Lateral pelvic/thigh      supports mounts  []  fixed   []  swing-away   []  removable  []  mounts lateral pelvic/thigh supports     []  mounts lateral pelvic/thigh supports swing-away or removable for transfers    []  Medial thigh support ([place]mel)  [] decrease adduction  [] accommodate ROM  []  remove for transfers   []  alignment      []  Medial thigh   []  fixed      support mounts      []  swing-away   []  removable  []  mounts medial thigh supports   []  Mounts medial supports swing- away or removable for transfers       Component  Justification   []  Back       []  provide posterior trunk support []  facilitate tone  []  provide lumbar/sacral support []  accommodate deformity  []  support trunk in midline   []  custom required "off-the-shelf" back support will not accommodate deformity   []  provide lateral trunk support []  accommodate or decrease tone            []  Back mounts  []  fixed  []  removable  []  attach back rest/cushion to wheelchair frame   []  Lateral trunk      supports  []  R []  L  []  decrease lateral trunk leaning  []  accommodate asymmetry    []  contour for  increased contact  []  safety    []  control of tone    []  Lateral trunk      supports mounts  []  fixed  []  swing-away   []  removable  []  mounts lateral trunk supports     []  Mounts lateral trunk supports swing-away or removable for transfers   []  Anterior chest      strap, vest     []  decrease forward movement of shoulder  []  decrease forward movement of trunk  []  safety/stability  []  added abdominal support  []  trunk alignment  []  assistance with shoulder control   []  decrease shoulder elevation    []  Headrest      []  provide posterior head support  []   provide posterior neck support  []  provide lateral head support  []  provide anterior head support  []  support during tilt and recline  []  improve feeding     []  improve respiration  []  placement of switches  []  safety    []  accommodate ROM   []  accommodate tone  []  improve visual orientation   []  Headrest           []  fixed []  removable []  flip down      Mounting hardware   []  swing-away laterals/switches  []  mount headrest   []  mounts headrest flip down or  removable for transfers  []  mount headrest swing-away laterals   []  mount switches     []  Neck Support    []  decrease neck rotation  []  decrease forward neck flexion   Pelvic Positioner    [x]  std hip belt          []  padded hip belt  []  dual pull hip belt  []  four point hip belt  [x]  stabilize tone  [x]  decrease falling out of chair  []  prevent excessive extension  []  special pull angle to control      rotation  []  pad for protection over boney   prominence  []  promote comfort    []  Essential needs        bag/pouch   []  medicines []  special food rorthotics []  clothing changes  []  diapers  []  catheter/hygiene []  ostomy supplies   The above equipment has a life- long use expectancy.  Growth and changes in medical and/or functional conditions would be the exceptions.   SUMMARY:    ASSESSMENT:  CLINICAL IMPRESSION: The patient is a 58 year old female who presented for physical therapy evaluation related to power mobility needs. Her medical history is significant for stage II chronic kidney disease, diabetes mellitus, left below-knee amputation (BKA), multiple right knee surgeries with associated structural damage (including possible patellar tendon rupture and ligament involvement), bilateral shoulder pain, right knee pain, and dependency on a lower limb prosthesis.  The patient is currently non-functional as an ambulator due to right knee pain, weakness, and prosthetic dependence on the left. She is using a manual wheelchair  for in-home and community mobility; however, her ability to self-propel is significantly limited due to bilateral shoulder pain, decreased right upper extremity strength, and her elevated body mass index (BMI 38). These impairments limit her independence and ability to safely complete mobility-related activities of daily living (MRADLs).  She requires assistance for transfers and is currently able to perform bed-to-chair transfers with scooting and standby/minimal assistance, depending on the height of the target surface. Due to bilateral shoulder pain and upper extremity weakness, she is unable to perform independent pressure relief via upper extremity push-ups, placing her at  increased risk for skin breakdown. A custom cushion alone is insufficient for pressure relief given her high wheelchair use and body mechanics.  A Group 2 power wheelchair with power tilt and seat elevation functions is medically necessary to meet the patient's functional needs and prevent further musculoskeletal injury. Specifically:  Power tilt will provide essential pressure relief by redistributing weight without requiring upper extremity effort. This function also promotes postural control, relieves back pain during prolonged sitting, and assists in managing lower extremity muscle spasms.  Seat elevation will enable the patient to reach overhead and deep surfaces in the kitchen and bathroom, facilitating independence with MRADLs. It will also assist in safe transfers by allowing her to match the wheelchair seat height with various surfaces, reducing strain on her shoulders and right knee.  Given her limited manual mobility and upper body impairments, this is the least costly and most functionally appropriate option to maximize safety, reduce caregiver burden, and enhance quality of life.  The patient is motivated and a willing user of power mobility equipment. Provision of a Group 2 power wheelchair with power tilt and seat  elevator will significantly improve her independence, reduce the risk of further injury, and support her in safely performing essential daily tasks.   OBJECTIVE IMPAIRMENTS Abnormal gait, decreased activity tolerance, decreased balance, decreased endurance, decreased mobility, difficulty walking, decreased ROM, decreased strength, hypomobility, increased edema, increased muscle spasms, impaired flexibility, impaired UE functional use, impaired vision/preception, prosthetic dependency , and pain.   ACTIVITY LIMITATIONS carrying, lifting, standing, squatting, stairs, transfers, bed mobility, bathing, toileting, reach over head, and locomotion level  PARTICIPATION LIMITATIONS: meal prep, cleaning, laundry, shopping, community activity, and yard work  PERSONAL FACTORS Time since onset of injury/illness/exacerbation and 3+ comorbidities: L BKA, R knee pain, bil shoulder pain CKD, DM, legally blind in L eye are also affecting patient's functional outcome.   REHAB POTENTIAL: Good  CLINICAL DECISION MAKING: Stable/uncomplicated  EVALUATION COMPLEXITY: Moderate                                   GOALS: One time visit. No goals established.    PLAN: PT FREQUENCY: one time visit    Raj LOISE Blanch, PT 04/01/2024, 10:40 AM    I concur with the above findings and recommendations of the therapist:  Physician name printed:         Physician's signature:      Date:     Addended 07/30/24 for medical justification for insurance company [facility] [address] [place], [place]  [address] Phone:  [phone]   Fax:  [phone]   July 25, 2024    No Recipients   Patient: Katrina Wolfe  MRN: [id number]  Date of Birth: March 12, 1966    Medical Necessity Letter for Power Mobility Device   Patient Name: Meriam Patient Name] Date of Birth: Meriam DOB] Insurance Provider: [employer] Date: Meriam Date] Referring Provider: Meriam  Referring Physician Name] PT Evaluator: Raj Blanch, Supervisor Rehabilitation   Dear Medical Review Team,   I am writing to support the medical necessity of a Group 2 power wheelchair with power tilt and seat elevation functions for the above-named patient, a 58 year old female with complex medical and functional impairments that significantly limit her mobility and independence.   Medical History and Functional Limitations: The patient presents with a history of:   Stage II chronic kidney disease Diabetes mellitus Left below-knee amputation (BKA) Multiple  right knee surgeries with structural damage (possible patellar tendon rupture and ligament involvement) Bilateral shoulder pain and right upper extremity weakness Elevated BMI (38) These conditions result in:   Non-functional ambulation due to right knee pain and prosthetic dependence Limited manual wheelchair propulsion due to shoulder pain and upper extremity weakness Dependence on others for transfers and mobility-related activities of daily living Dear To To whom this may concern,   I am writing to support the medical necessity of a Group 2 power wheelchair with power tilt and seat elevation functions for the above-named patient, a 58 year old female with complex medical and functional impairments that significantly limit her mobility and independence.   Medical History and Functional Limitations: The patient presents with a history of: Stage II chronic kidney disease Diabetes mellitus Left below-knee amputation (BKA) Multiple right knee surgeries with structural damage (possible patellar tendon rupture and ligament involvement) Bilateral shoulder pain and right upper extremity weakness Elevated BMI (38) LE edema These conditions result in: Non-functional ambulation due to right knee pain and prosthetic dependence Limited manual wheelchair propulsion due to shoulder pain and upper extremity weakness Dependence on others  for transfers and mobility-related activities of daily living (MRADLs) Inability to perform independent pressure relief, increasing risk for skin breakdown   A Group 2 power wheelchair with power tilt and seat elevation is medically necessary for the following reasons: Power Tilt Function: Enables pressure relief without upper extremity exertion, reducing risk of skin breakdown Promotes postural control and alleviates back pain during prolonged sitting Assists in managing lower extremity muscle spasms Seat Elevation Function: Facilitates access to overhead and deep surfaces in the kitchen and bathroom Enhances independence with MRADLs Supports safer transfers by allowing seat height adjustment to match various surfaces, reducing strain on shoulders and knees     Clinical Rationale: Given her inability to ambulate and self-propel a manual wheelchair, and her upper body impairments, a power wheelchair with these features is the least costly, most functionally appropriate solution. It will: Maximize safety and independence Reduce caregiver burden Prevent further musculoskeletal injury Improve quality of life The patient is motivated and capable of using power mobility equipment appropriately. She has demonstrated willingness and understanding of its use during evaluation. Conclusion: I strongly recommend approval of a Group 2 power wheelchair with power tilt and seat elevation functions to meet this patient's medical and functional needs. This equipment is essential for her safety, independence, and overall well-being.   Please feel free to contact me for any additional information or clarification.   Sincerely, Raj Blanch, PT

## 2024-04-02 NOTE — Addendum Note (Signed)
 Addended by: Kristine Phalen on: 04/02/2024 01:33 PM   Modules accepted: Orders

## 2024-04-03 ENCOUNTER — Ambulatory Visit: Admitting: Physical Therapy

## 2024-04-03 DIAGNOSIS — R2681 Unsteadiness on feet: Secondary | ICD-10-CM

## 2024-04-03 DIAGNOSIS — R2689 Other abnormalities of gait and mobility: Secondary | ICD-10-CM

## 2024-04-03 DIAGNOSIS — M6281 Muscle weakness (generalized): Secondary | ICD-10-CM | POA: Diagnosis not present

## 2024-04-03 DIAGNOSIS — Z89512 Acquired absence of left leg below knee: Secondary | ICD-10-CM | POA: Diagnosis not present

## 2024-04-03 DIAGNOSIS — M25561 Pain in right knee: Secondary | ICD-10-CM | POA: Diagnosis not present

## 2024-04-03 DIAGNOSIS — G8929 Other chronic pain: Secondary | ICD-10-CM | POA: Diagnosis not present

## 2024-04-03 NOTE — Therapy (Signed)
 OUTPATIENT PHYSICAL THERAPY PROSTHETICS TREATMENT- DISCHARGE SUMMARY   Patient Name: Katrina Wolfe MRN: [id number] DOB:[birth year], 58 y.o., female Today's Date: 04/03/2024  PCP: Dr. Tyson Dense REFERRING PROVIDER: Nadara Mustard, MD  PHYSICAL THERAPY DISCHARGE SUMMARY  Visits from Start of Care: 15  Current functional level related to goals / functional outcomes: Pt independent w/WC mobility and requires up to mod A for transfers due to low height of chair and R knee pain    Remaining deficits: Severe R knee pain, L BKA, bilateral patellar tendon rupture, decreased functional strength, blind in L eye, high fall risk    Education / Equipment: HEP, prosthetic wear schedule    Patient agrees to discharge. Patient goals were  not assessed . Patient is being discharged due to maximized rehab potential.      END OF SESSION:  PT End of Session - 04/03/24 0936     Visit Number 15    Number of Visits 25   Recert   Date for PT Re-Evaluation 04/22/24    Authorization Type [employer] Medicare/ [employer]    PT Start Time 0932    PT Stop Time [phone]   DC   PT Time Calculation (min) 24 min    Equipment Utilized During Treatment Other (comment)   L BKA prosthetic   Activity Tolerance Patient limited by pain    Behavior During Therapy WFL for tasks assessed/performed                    Past Medical History:  Diagnosis Date   Abnormal uterine bleeding (AUB)    Arthritis    knees, fingers   Asthma    as child, no problem since 1995   Bell's palsy 2004   CKD (chronic kidney disease), stage II    Complication of anesthesia    Congenital cardiomegaly    per pt has always been told since childhood and siblings    Diabetes mellitus without complication (HCC)    Dysrhythmia    Occasionally skipped beats   Elevated liver function tests    GERD (gastroesophageal reflux disease)    History of genital warts    HIV (human immunodeficiency virus infection) (HCC)    DX  1998--  MONITORED BY INFECTIOUS DISEASE-- DR Judyann Munson   Hypercholesterolemia    Hypertension    Intermittent palpitations    mild-- no meds   Legally blind in left eye, as defined in [place]    trauma as child   Peripheral vascular disease (HCC)    Pneumonia    PONV (postoperative nausea and vomiting)    Renal disorder    Tuberculosis    ? 2005 or 2007   Type 2 diabetes mellitus (HCC)    Type II    Uterine fibroid    VIN III (vulvar intraepithelial neoplasia III)    and Verrucoid lesion on the mons   Wears glasses    Past Surgical History:  Procedure Laterality Date   AMPUTATION Left 06/27/2023   Procedure: LEFT BELOW KNEE AMPUTATION;  Surgeon: Nadara Mustard, MD;  Location: [facility] OR;  Service: Orthopedics;  Laterality: Left;   ANKLE FUSION Left 02/21/2023   Procedure: REVISION LEFT ANKLE FUSION;  Surgeon: Nadara Mustard, MD;  Location: [facility] OR;  Service: Orthopedics;  Laterality: Left;   CESAREAN SECTION  2001  &  1984   CO2 LASER APPLICATION N/A 11/26/2015   Procedure: CO2 LASER APPLICATION AND WIDE VOCAL EXCSION OF LESION ON MONS PUBIS;  Surgeon: De Blanch, MD;  Location: [facility];  Service: Gynecology;  Laterality: N/A;   CASE CANCELLED    CO2 LASER APPLICATION N/A 01/07/2016   Procedure: CO2 LASER OF VULVAR;  Surgeon: De Blanch, MD;  Location: [facility];  Service: Gynecology;  Laterality: N/A;   CORNEAL TRANSPLANT Left 12/18/2004   failed   D & C HYSTEROSCOPY /  RESECTION FIBROID  12/18/2002   DILATATION & CURETTAGE/HYSTEROSCOPY WITH MYOSURE N/A 10/01/2019   Procedure: DILATATION & CURETTAGE/HYSTEROSCOPY WITH MYOSURE and HYDROTHERMAL ABLATION;  Surgeon: Geryl Rankins, MD;  Location: [facility];  Service: Gynecology;  Laterality: N/A;  HTA rep will be here. Confirmed on 09/25/19 CS   EYE SURGERY     FOOT ARTHRODESIS Left 11/30/2021   Procedure: LEFT TIBIOCALCANEAL FUSION;  Surgeon: Nadara Mustard, MD;   Location: [facility] OR;  Service: Orthopedics;  Laterality: Left;   HARDWARE REMOVAL Left 04/04/2019   Procedure: EXCISION BONE AND REMOVE DEEP HARDWARE LEFT 5TH METATARSAL;  Surgeon: Nadara Mustard, MD;  Location: [facility] OR;  Service: Orthopedics;  Laterality: Left;   HARDWARE REMOVAL Left 08/04/2022   Procedure: REMOVAL DEEP HARDWARE LEFT ANKLE;  Surgeon: Nadara Mustard, MD;  Location: [facility] OR;  Service: Orthopedics;  Laterality: Left;   I & D EXTREMITY Left 06/27/2021   Procedure: IRRIGATION AND DEBRIDEMENT FOOT ULCER;  Surgeon: Kerrin Champagne, MD;  Location: [facility] OR;  Service: Orthopedics;  Laterality: Left;   KNEE ARTHROSCOPY W/ ACL RECONSTRUCTION Bilateral 12/18/2006   ORIF TOE FRACTURE Left 08/02/2017   Procedure: OPEN REDUCTION INTERNAL FIXATION (ORIF) FIFTH METATARSAL (TOE) BASE FRACTURE;  Surgeon: Toni Arthurs, MD;  Location: [facility];  Service: Orthopedics;  Laterality: Left;   VULVECTOMY N/A 01/07/2016   Procedure: WIDE LOCAL EXCISION;  Surgeon: De Blanch, MD;  Location: [facility];  Service: Gynecology;  Laterality: N/A;  mons pubis as site   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, with necrosis of bone (HCC)    Diabetes mellitus type 2, uncontrolled, with complications 06/26/2021   Diabetic nephropathy (HCC) 06/26/2021   CKD (chronic kidney disease), stage III (HCC) 06/26/2021   HTN (hypertension) 06/24/2021   HIV (human immunodeficiency virus infection) (HCC) 06/24/2021   DM2 (diabetes mellitus, type 2) (HCC) 06/24/2021   HLD (hyperlipidemia) 06/24/2021   Osteomyelitis of left foot (HCC) 06/24/2021   Elevated LFTs 06/24/2021   Acute-on-chronic kidney injury (HCC)  06/24/2021   Osteomyelitis of foot (HCC) 04/04/2019   Hardware complicating wound infection (HCC)    Subacute osteomyelitis, left ankle and foot (HCC)    Open displaced fracture of fifth metatarsal bone of left foot    Healthcare maintenance 04/03/2019   HIV disease (HCC) 10/27/2015   DM type 2 (diabetes mellitus, type 2) (HCC) 10/27/2015   Hyperlipidemia 10/27/2015   Essential hypertension 10/27/2015   Severe vulvar dysplasia, histologically confirmed 10/15/2015    ONSET DATE: 12/17/2023  REFERRING DIAG: Z61.096 (ICD-10-CM) - S/P BKA (below knee amputation) unilateral, left (HCC)  THERAPY DIAG:  Chronic pain of right knee  Unsteadiness on feet  Muscle weakness (generalized)  Other abnormalities of gait and mobility  Rationale for Evaluation and Treatment: Rehabilitation  SUBJECTIVE:  SUBJECTIVE STATEMENT: Patient presents in WC. States she tried to stand at home but her R knee pain is too intense and she is afraid of it buckling on her. No falls. Still wearing L BKA prosthetic ~4 hours per day but has had more pain in residual limb recently.   Pt accompanied by: self   PERTINENT HISTORY: HIV, PMH includes: arthritis, asthma, Bell's palsy, CKD II, DM II, HIV, HTN, PVD, blind in L eye, and multiple previous L ankle surgeries, prior R TKA  PAIN:  Are you having pain? Yes: NPRS scale: 8/10  Pain location: RLE Pain description: Aching    PRECAUTIONS: Fall and Other: HIV  RED FLAGS: None   WEIGHT BEARING RESTRICTIONS: No  FALLS: Has patient fallen in last 6 months? No  LIVING ENVIRONMENT: Lives with: lives with their family and both sons, daughter in Social worker, grandkids Lives in: House/apartment Home Access: Stairs to enter and Ramped entrance Home layout: Two level Stairs:  has stairs but also has ramp entry. Has following equipment at home: Otho Blitz - 4 wheeled and shower chair  PLOF: Requires assistive device for independence and Needs assistance with  gait  PATIENT GOALS: to be able to walk better (pt hasn't walked well since 2018)  OBJECTIVE:  Note: Objective measures were completed at Evaluation unless otherwise noted.  COGNITION: Overall cognitive status: Within functional limits for tasks assessed   LOWER EXTREMITY ROM:  Active ROM Right eval Left eval  Hip flexion    Hip extension    Hip abduction    Hip adduction    Hip internal rotation    Hip external rotation    Knee flexion    Knee extension    Ankle dorsiflexion    Ankle plantarflexion    Ankle inversion    Ankle eversion     (Blank rows = not tested)  LOWER EXTREMITY MMT:  MMT Right eval Left eval  Hip flexion    Hip extension    Hip abduction    Hip adduction    Hip internal rotation    Hip external rotation    Knee flexion    Knee extension    Ankle dorsiflexion    Ankle plantarflexion    Ankle inversion    Ankle eversion    (Blank rows = not tested)  TRANSFERS: Sit to stand: Total A Stand to sit: Total A Lateral scoot transfers: SBA  GAIT: UNABLE  FUNCTIONAL TESTS: Unable to perform any functional tests as patient is non ambulatory at this time    CURRENT PROSTHETIC WEAR ASSESSMENT: Patient is independent with: skin check, residual limb care, prosthetic cleaning, and ply sock cleaning Patient is dependent with: residual limb care, correct ply sock adjustment, proper wear schedule/adjustment, and proper weight-bearing schedule/adjustment Donning prosthesis: SBA Doffing prosthesis: SBA Prosthetic wear tolerance: 3 hours/day, 7 days/week Prosthetic weight bearing tolerance: 0 minutes Edema: present Residual limb condition: dry scabs present, well healed   PATIENT SURVEYS:  LEFS 8/80  TREATMENT DATE:  Self-Care Discussed appointment w/Dr. Julio Ohm and pros/cons of knee fusion. Informed pt that this procedure is  solely for pain modulation, not functional use of leg and it is pt's decision.  Informed pt that she has reached her max potential in therapy w/current circumstance as she is unable to work on transfers at home w/current WC and pain in RLE. Pt in agreement with this and will plan to pause PT until pt obtains new power chair and then reassess.  Verbally reviewed HEP and provided pt w/red and green theraband to work on strength at home.  Educated pt on importance of building up prosthetic wear schedule to 10-12 hours daily for proper shaping of LLE and pain modulation. Pt verbalized understanding.     PATIENT EDUCATION: Education details: See above Person educated: Patient Education method: Explanation Education comprehension: verbalized understanding  HOME EXERCISE PROGRAM: Access Code: [email or web address] URL: [email or web address] Date: 01/17/2024 Prepared by: Burleigh Carp Namish Krise  Exercises - Single Leg Bridge  - 1 x daily - 7 x weekly - 3 sets - 10 reps - Clamshell  - 1 x daily - 7 x weekly - 3 sets - 10 reps - Seated knee extension   - 1 x daily - 7 x weekly - 3 sets - 10 reps - Beginner Prone Single Leg Raise  - 1 x daily - 7 x weekly - 1-2 sets - 10-15 reps  Desensitization Techniques: -Light touch/pressure:  using fingertip pressure to slowly move up small segments of the affected body part until outside the area of pain and then back to where you started -Deep pressure:  using fingertips to squeeze in small segments starting outside the affected area, crossing over the affected area, and then to the other side of the affected area -Tapping:  fingertips tap with firm pressure over the affected area, can move around the affected area as well -Brushing/scratching:  use fingertips to brush/scratch over and around the affected area -Textures:  use soft and rough textured items like cotton balls vs wash cloths to brush or rub with light into firm pressure over and around the affected  area  Repeat these 3-4x per day.   ASSESSMENT:  CLINICAL IMPRESSION: Emphasis of skilled PT session on pt education and DC from PT. Pt has not met any STG or LTG this date and is largely limited by severe R knee pain. Pt also unable to practice transfers at home due to low height of current WC, so has reached max potential in OPPT at this time. Pt did complete power WC eval this week and will plan on returning to PT once new WC is in. Pt educated on pros/cons of knee fusion surgery and informed it is entirely her decision. Pt verbalized understanding and agreement with PT plan at this time. Pt to work on improving wear time of L prosthetic while on break from PT to shape LLE and work on desensitization.   OBJECTIVE IMPAIRMENTS: Abnormal gait, decreased activity tolerance, decreased balance, decreased endurance, decreased mobility, difficulty walking, decreased strength, hypomobility, increased edema, increased muscle spasms, impaired flexibility, improper body mechanics, postural dysfunction, prosthetic dependency , and pain.   ACTIVITY LIMITATIONS: carrying, lifting, bending, standing, squatting, stairs, transfers, and locomotion level  PARTICIPATION LIMITATIONS: meal prep, cleaning, laundry, driving, shopping, community activity, and yard work  PERSONAL FACTORS: Time since onset of injury/illness/exacerbation are also affecting patient's functional outcome.   REHAB POTENTIAL: Good  CLINICAL DECISION MAKING: Stable/uncomplicated  EVALUATION COMPLEXITY: Low   GOALS: Goals reviewed with  patient? Yes  SHORT TERM GOALS: Target date: 02/07/2024   Patient will tolerate wearing prosthetic for 4 hours/day without discomfort or skin irritation Baseline: 1hour (1/23); 2 hours (2/27) Goal status: IN PROGRESS  2.  Patient will demonstrate proper donning and doffing of the prosthetic independently without any assistance. Baseline: SBA, needs cueing (1/23) Goal status: MET  3.  Patient will  be be able to WB on prosthetic for total of 5 min/day to improve functional use. Baseline: unable to stand due to pain in R LE (non amputated Extremity) (1/23) Goal status: IN PROGRESS   4.  Patient will be able to perform stand to sit with max A x 1 with use of RW and be able to perform stand pivot transfers.  Baseline: lateral scoot from chair to bed (SBA) (1/23); sit to stands w/min A  Goal status: IN PROGRESS    LONG TERM GOALS: Target date: 03/06/2024   Patient will be able to ambulate 20 feet with RW and CGA to improve functional ambulation Baseline: hasn't ambulated since 2018 (1/23) Goal status: IN PROGRESS   2.  Pt will be able to perform stand transer with walker with SBA only Baseline: lateral scoot with SBA (1/23) Goal status: IN PROGRESS   3.  Patient will demo 15% improvement on LEFS to improve functional ability Baseline: 8/80 (1/23) Goal status: IN PROGRESS   4.  Patient will be able to stand for at least 15 min per day in total with use of RW or appropriate AD to improve functional WB Baseline: unable to stand (1/23) Goal status: INITIAL  NEW SHORT TERM GOALS FOR EXTENDED POC:   Target date: 04/01/2024  Pt will perform stand pivot w/RW and min A for improved weightbearing tolerance on LLE and independence  Baseline:  Goal status: NOT MET  2.  Pt will ambulate 20' w/RW and min A for improved endurance and weightbearing tolerance  Baseline: 1' w/min A  Goal status: NOT MET  3.   Patient will demo 15% improvement on LEFS to improve functional ability Baseline:  8/80 (1/23) Goal status: NOT MET   NEW LONG TERM GOALS FOR EXTENDED POC:  Target date: 04/15/2024  Pt will ambulate 50' w/RW and min A for improved independence and endurance for household distances  Baseline: 62' w/min A  Goal status: NOT MET  2.  Pt will perform stand pivot w/RW and SBA for improved independence and functional mobility   Baseline:  Goal status: NOT MET    PLAN:  PT  FREQUENCY: 2x/week  PT DURATION: 8 weeks  PLANNED INTERVENTIONS: 97164- PT Re-evaluation, 97110-Therapeutic exercises, 97530- Therapeutic activity, 97112- Neuromuscular re-education, 97535- Self Care, 40981- Manual therapy, [phone]- Gait training, [phone]- Prosthetic training, Patient/Family education, Balance training, Stair training, Dry Needling, Joint mobilization, Scar mobilization, DME instructions, Wheelchair mobility training, Cryotherapy, and Moist heat   Leaman Abe E Adriane Guglielmo, PT, DPT  04/03/2024, 9:58 AM

## 2024-04-08 ENCOUNTER — Encounter: Admitting: Physical Therapy

## 2024-04-10 ENCOUNTER — Ambulatory Visit: Admitting: Physical Therapy

## 2024-04-15 ENCOUNTER — Encounter: Admitting: Physical Therapy

## 2024-04-16 ENCOUNTER — Other Ambulatory Visit: Payer: Self-pay

## 2024-04-16 DIAGNOSIS — B20 Human immunodeficiency virus [HIV] disease: Secondary | ICD-10-CM

## 2024-04-16 DIAGNOSIS — Z79899 Other long term (current) drug therapy: Secondary | ICD-10-CM

## 2024-04-16 DIAGNOSIS — Z113 Encounter for screening for infections with a predominantly sexual mode of transmission: Secondary | ICD-10-CM

## 2024-04-17 ENCOUNTER — Encounter: Admitting: Physical Therapy

## 2024-04-21 ENCOUNTER — Other Ambulatory Visit: Payer: 59

## 2024-04-21 ENCOUNTER — Other Ambulatory Visit: Payer: Self-pay

## 2024-04-21 DIAGNOSIS — B20 Human immunodeficiency virus [HIV] disease: Secondary | ICD-10-CM

## 2024-04-21 DIAGNOSIS — Z79899 Other long term (current) drug therapy: Secondary | ICD-10-CM

## 2024-04-21 DIAGNOSIS — Z113 Encounter for screening for infections with a predominantly sexual mode of transmission: Secondary | ICD-10-CM

## 2024-04-22 LAB — C. TRACHOMATIS/N. GONORRHOEAE RNA
C. trachomatis RNA, TMA: NOT DETECTED
N. gonorrhoeae RNA, TMA: NOT DETECTED

## 2024-04-23 LAB — CBC WITH DIFFERENTIAL/PLATELET
Absolute Lymphocytes: 2373 {cells}/uL (ref 850–3900)
Absolute Monocytes: 622 {cells}/uL (ref 200–950)
Basophils Absolute: 34 {cells}/uL (ref 0–200)
Basophils Relative: 0.3 %
Eosinophils Absolute: 181 {cells}/uL (ref 15–500)
Eosinophils Relative: 1.6 %
HCT: 45.8 % — ABNORMAL HIGH (ref 35.0–45.0)
Hemoglobin: 15.3 g/dL (ref 11.7–15.5)
MCH: 33.5 pg — ABNORMAL HIGH (ref 27.0–33.0)
MCHC: 33.4 g/dL (ref 32.0–36.0)
MCV: 100.2 fL — ABNORMAL HIGH (ref 80.0–100.0)
MPV: 11.2 fL (ref 7.5–12.5)
Monocytes Relative: 5.5 %
Neutro Abs: 8091 {cells}/uL — ABNORMAL HIGH (ref 1500–7800)
Neutrophils Relative %: 71.6 %
Platelets: 337 10*3/uL (ref 140–400)
RBC: 4.57 10*6/uL (ref 3.80–5.10)
RDW: 12.5 % (ref 11.0–15.0)
Total Lymphocyte: 21 %
WBC: 11.3 10*3/uL — ABNORMAL HIGH (ref 3.8–10.8)

## 2024-04-23 LAB — LIPID PANEL
Cholesterol: 110 mg/dL (ref <200)
HDL: 39 mg/dL — ABNORMAL LOW (ref >=50)
LDL Cholesterol (Calc): 52 mg/dL
Non-HDL Cholesterol (Calc): 71 mg/dL (ref <130)
Total CHOL/HDL Ratio: 2.8 (calc) (ref <5.0)
Triglycerides: 104 mg/dL (ref <150)

## 2024-04-23 LAB — COMPLETE METABOLIC PANEL WITHOUT GFR
AG Ratio: 1.3 (calc) (ref 1.0–2.5)
ALT: 10 U/L (ref 6–29)
AST: 16 U/L (ref 10–35)
Albumin: 4 g/dL (ref 3.6–5.1)
Alkaline phosphatase (APISO): 40 U/L (ref 37–153)
BUN/Creatinine Ratio: 11 (calc) (ref 6–22)
BUN: 13 mg/dL (ref 7–25)
CO2: 23 mmol/L (ref 20–32)
Calcium: 9.8 mg/dL (ref 8.6–10.4)
Chloride: 102 mmol/L (ref 98–110)
Creat: 1.15 mg/dL — ABNORMAL HIGH (ref 0.50–1.03)
Globulin: 3 g/dL (ref 1.9–3.7)
Glucose, Bld: 147 mg/dL — ABNORMAL HIGH (ref 65–99)
Potassium: 4.1 mmol/L (ref 3.5–5.3)
Sodium: 138 mmol/L (ref 135–146)
Total Bilirubin: 0.3 mg/dL (ref 0.2–1.2)
Total Protein: 7 g/dL (ref 6.1–8.1)

## 2024-04-23 LAB — T-HELPER CELLS (CD4) COUNT (NOT AT ARMC)
Absolute CD4: 830 {cells}/uL (ref 490–1740)
CD4 T Helper %: 33 % (ref 30–61)
Total lymphocyte count: 2541 {cells}/uL (ref 850–3900)

## 2024-04-23 LAB — RPR: RPR Ser Ql: NONREACTIVE

## 2024-04-23 LAB — HIV-1 RNA QUANT-NO REFLEX-BLD
HIV 1 RNA Quant: NOT DETECTED {copies}/mL
HIV-1 RNA Quant, Log: NOT DETECTED {Log_copies}/mL

## 2024-04-28 NOTE — Progress Notes (Signed)
 The ASCVD Risk score (Arnett DK, et al., 2019) failed to calculate for the following reasons:   The valid total cholesterol range is 130 to 320 mg/dL  Katrina Wolfe, BSN, RN

## 2024-05-05 ENCOUNTER — Other Ambulatory Visit: Payer: Self-pay

## 2024-05-05 ENCOUNTER — Encounter: Payer: Self-pay | Admitting: Internal Medicine

## 2024-05-05 ENCOUNTER — Ambulatory Visit (INDEPENDENT_AMBULATORY_CARE_PROVIDER_SITE_OTHER): Payer: Self-pay | Admitting: Internal Medicine

## 2024-05-05 VITALS — BP 156/101 | HR 76 | Resp 16 | Ht 69.5 in | Wt 263.0 lb

## 2024-05-05 DIAGNOSIS — R058 Other specified cough: Secondary | ICD-10-CM | POA: Diagnosis not present

## 2024-05-05 DIAGNOSIS — B20 Human immunodeficiency virus [HIV] disease: Secondary | ICD-10-CM | POA: Diagnosis not present

## 2024-05-05 DIAGNOSIS — Z79899 Other long term (current) drug therapy: Secondary | ICD-10-CM

## 2024-05-05 MED ORDER — DOVATO 50-300 MG PO TABS: ORAL_TABLET | ORAL | 11 refills | Status: DC

## 2024-05-05 NOTE — Progress Notes (Unsigned)
 RFV: follow up hiv disease  Patient ID: Katrina Wolfe, female   DOB: 1966/09/17, 58 y.o.   MRN: [id number]  HPI On dovato , not missing any doses. 830 cd-4 count with undetectable VL on 04/21/2024 !  Right bka- well healed. Going to PT in June, after she gets power chair Had asthma exacerbation in end march - notices having residual phlegm, no chills, no fever  Changing trulicity  to ozempic - no problems for taking meds  Outpatient Encounter Medications as of 05/05/2024  Medication Sig   acetaminophen  (TYLENOL ) 500 MG tablet Take 1,000 mg by mouth every 6 (six) hours as needed for moderate pain.   amLODipine  (NORVASC ) 5 MG tablet Take 5 mg by mouth in the morning.   aspirin  EC 81 MG tablet Take 81 mg by mouth daily. Swallow whole.   calcium  carbonate (TUMS - DOSED IN MG ELEMENTAL CALCIUM ) 500 MG chewable tablet Chew 1 tablet by mouth as needed for indigestion or heartburn.   Calcium  Carbonate-Vitamin D 600-200 MG-UNIT TABS Take 1 tablet by mouth daily.   COMFORT EZ PEN NEEDLES 31G X 8 MM MISC USE WITH insulin  THREE TIMES DAILY AS DIRECTED   Continuous Blood Gluc Receiver (DEXCOM G6 RECEIVER) DEVI as directed 30 days   Continuous Glucose Sensor (DEXCOM G6 SENSOR) MISC Apply topically every other day.   Continuous Glucose Transmitter (DEXCOM G6 TRANSMITTER) MISC USE AS DIRECTED EVERY 90 DAYS   diphenhydrAMINE (BENADRYL) 25 MG tablet Take 25 mg by mouth every 6 (six) hours as needed for allergies (or allergic reactions).   dolutegravir -lamiVUDine  (DOVATO ) 50-300 MG tablet TAKE ONE TABLET BY MOUTH EVERYDAY AT BEDTIME   gabapentin  (NEURONTIN ) 100 MG capsule Take 100 mg by mouth 4 (four) times daily.   HUMALOG  MIX 75/25 KWIKPEN (75-25) 100 UNIT/ML Kwikpen Inject 45 Units into the skin 2 (two) times daily. Breakfast and Dinner   Insulin  Syringe-Needle U-100 (INSULIN  SYRINGE 1CC/31GX5/16") 31G X 5/16" 1 ML MISC 1 Units by Does not apply route 2 (two) times daily.   Lancets (ONETOUCH ULTRASOFT)  lancets    lisinopril -hydrochlorothiazide  (ZESTORETIC ) 20-25 MG tablet Take 1 tablet by mouth daily.   methocarbamol  (ROBAXIN ) 500 MG tablet TAKE 1 TABLET BY MOUTH EVERY 8 HOURS AS NEEDED FOR MUSCLE SPASMS   norethindrone  (AYGESTIN ) 5 MG tablet Take 5 mg by mouth in the morning and at bedtime.   nystatin  (MYCOSTATIN /NYSTOP ) powder Apply 1 Application topically 3 (three) times daily.   ONE TOUCH ULTRA TEST test strip    pantoprazole  (PROTONIX ) 20 MG tablet Take 20 mg by mouth every morning.   pioglitazone  (ACTOS ) 30 MG tablet Take 30 mg by mouth every morning.   rosuvastatin  (CRESTOR ) 5 MG tablet Take 5 mg by mouth daily with lunch.   Semaglutide, 2 MG/DOSE, (OZEMPIC, 2 MG/DOSE,) 8 MG/3ML SOPN Inject into the skin.   traMADol  (ULTRAM ) 50 MG tablet Take 50 mg by mouth 3 (three) times daily as needed.   Dulaglutide  (TRULICITY ) 4.5 MG/0.5ML SOPN Inject 4.5 mg into the skin every Friday. (Patient not taking: Reported on 10/22/2023)   glimepiride  (AMARYL ) 2 MG tablet Take 2 mg by mouth daily with breakfast.   HUMALOG  KWIKPEN 100 UNIT/ML KwikPen SMARTSIG:10-15 Unit(s) SUB-Q Daily (Patient not taking: Reported on 05/05/2024)   oxyCODONE  (OXY IR/ROXICODONE ) 5 MG immediate release tablet Take 1 tablet (5 mg total) by mouth every 4 (four) hours as needed for severe pain. (Patient not taking: Reported on 05/05/2024)   No facility-administered encounter medications on file as of 05/05/2024.  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, with necrosis of bone (HCC)    Diabetes mellitus type 2, uncontrolled, with complications 06/26/2021   Diabetic nephropathy (HCC) 06/26/2021   CKD (chronic kidney disease), stage III (HCC)  06/26/2021   HTN (hypertension) 06/24/2021   HIV (human immunodeficiency virus infection) (HCC) 06/24/2021   DM2 (diabetes mellitus, type 2) (HCC) 06/24/2021   HLD (hyperlipidemia) 06/24/2021   Osteomyelitis of left foot (HCC) 06/24/2021   Elevated LFTs 06/24/2021   Acute-on-chronic kidney injury (HCC) 06/24/2021   Osteomyelitis of foot (HCC) 04/04/2019   Hardware complicating wound infection (HCC)    Subacute osteomyelitis, left ankle and foot (HCC)    Open displaced fracture of fifth metatarsal bone of left foot    Healthcare maintenance 04/03/2019   HIV disease (HCC) 10/27/2015   DM type 2 (diabetes mellitus, type 2) (HCC) 10/27/2015   Hyperlipidemia 10/27/2015   Essential hypertension 10/27/2015   Severe vulvar dysplasia, histologically confirmed 10/15/2015     Health Maintenance Due  Topic Date Due   COVID-19 Vaccine (1) Never done   FOOT EXAM  Never done   OPHTHALMOLOGY EXAM  Never done   Zoster Vaccines- Shingrix (1 of 2) 08/16/1985   Pneumococcal Vaccine 31-65 Years old (3 of 3 - PPSV23, PCV20 or PCV21) 10/26/2020   Diabetic kidney evaluation - Urine ACR  05/17/2023   HEMOGLOBIN A1C  12/28/2023   Medicare Annual Wellness (AWV)  05/27/2024     Review of Systems  Physical Exam   BP (!) 156/101   Pulse 76   Resp 16   Ht 5' 9.5" (1.765 m)   Wt 263 lb (119.3 kg)   LMP 04/27/2021   SpO2 99%   BMI 38.28 kg/m    Lab Results  Component Value Date   CD4TCELL 33 04/21/2024   Lab Results  Component Value Date   CD4TABS 739 10/22/2023   CD4TABS 698 04/19/2023   CD4TABS 575 10/17/2022   Lab Results  Component Value Date   HIV1RNAQUANT NOT DETECTED 04/21/2024   Lab Results  Component Value Date   HEPBSAB NEG 10/13/2015   Lab Results  Component Value Date   LABRPR NON-REACTIVE 04/21/2024    CBC Lab Results  Component Value Date   WBC 11.3 (H) 04/21/2024   RBC 4.57 04/21/2024   HGB 15.3 04/21/2024   HCT 45.8 (H) 04/21/2024   PLT 337 04/21/2024    MCV 100.2 (H) 04/21/2024   MCH 33.5 (H) 04/21/2024   MCHC 33.4 04/21/2024   RDW 12.5 04/21/2024   LYMPHSABS 2.3 06/27/2023   MONOABS 0.7 06/27/2023   EOSABS 181 04/21/2024    BMET Lab Results  Component Value Date   NA 138 04/21/2024   K 4.1 04/21/2024   CL 102 04/21/2024   CO2 23 04/21/2024   GLUCOSE 147 (H) 04/21/2024   BUN 13 04/21/2024   CREATININE 1.15 (H) 04/21/2024   CALCIUM  9.8 04/21/2024   GFRNONAA 51 (L) 03/08/2024   GFRAA 48 (L) 02/22/2021      Assessment and Plan  Mucinex  for post viral cough/bronchitis Hiv continue on dovato 

## 2024-05-19 ENCOUNTER — Encounter: Payer: Self-pay | Admitting: Orthopedic Surgery

## 2024-05-19 ENCOUNTER — Ambulatory Visit (INDEPENDENT_AMBULATORY_CARE_PROVIDER_SITE_OTHER): Admitting: Orthopedic Surgery

## 2024-05-19 DIAGNOSIS — Z89512 Acquired absence of left leg below knee: Secondary | ICD-10-CM | POA: Diagnosis not present

## 2024-05-19 NOTE — Progress Notes (Signed)
 Office Visit Note   Patient: Katrina Wolfe           Date of Birth: 11-09-1966           MRN: [id number] Visit Date: 05/19/2024              Requested by: Jearldine Mina, MD [address] [place],  [place] [address] PCP: Jearldine Mina, MD  Chief Complaint  Patient presents with   f/u left BKA incision      HPI: Patient is a 58 year old woman is seen in follow-up for left below-knee amputation.  Patient states she has been having pain that is burning over the anterior compartment.  Assessment & Plan: Visit Diagnoses: No diagnosis found.  Plan: Continue with Neurontin  continue with the stump shrinker and slowly increase her use of the prosthesis.  Patient states her therapy is on hold until she gets her power chair and then she will resume therapy.  Follow-Up Instructions: Return if symptoms worsen or fail to improve.   Ortho Exam  Patient is alert, oriented, no adenopathy, well-dressed, normal affect, normal respiratory effort. Examination of the left below-knee amputation there is no swelling or cellulitis.  No open ulcers no drainage.  No fluctuation no signs of deep abscess. Narcotics: @CHLMME @  Imaging: No results found. No images are attached to the encounter.  Labs: Lab Results  Component Value Date   HGBA1C 7.7 (H) 06/27/2023   HGBA1C 8.5 (H) 02/21/2023   HGBA1C 8.2 (H) 11/30/2021   ESRSEDRATE 53 (H) 08/15/2021   ESRSEDRATE 74 (H) 06/30/2021   CRP 18.5 (H) 08/15/2021   CRP 0.9 06/30/2021   REPTSTATUS 07/03/2021 FINAL 06/27/2021   GRAMSTAIN  06/27/2021    RARE WBC PRESENT,BOTH PMN AND MONONUCLEAR NO ORGANISMS SEEN    CULT  06/27/2021    RARE CORYNEBACTERIUM SPECIES Standardized susceptibility testing for this organism is not available. NO ANAEROBES ISOLATED Performed at [facility], [address], [place], [place] [address]    LABORGA PSEUDOMONAS AERUGINOSA 04/04/2019   LABORGA SERRATIA MARCESCENS 04/04/2019   LABORGA  STAPHYLOCOCCUS AUREUS 04/04/2019     Lab Results  Component Value Date   ALBUMIN 3.4 (L) 06/27/2023   ALBUMIN 3.5 08/04/2022   ALBUMIN 2.8 (L) 07/02/2021   PREALBUMIN 32 06/27/2023    Lab Results  Component Value Date   MG 1.6 (L) 07/04/2021   MG 1.5 (L) 07/03/2021   MG 1.6 (L) 07/02/2021   No results found for: "VD25OH"  Lab Results  Component Value Date   PREALBUMIN 32 06/27/2023      Latest Ref Rng & Units 04/21/2024   10:56 AM 03/08/2024   11:50 AM 06/29/2023    1:50 AM  CBC EXTENDED  WBC 3.8 - 10.8 Thousand/uL 11.3  10.2  14.8   RBC 3.80 - 5.10 Million/uL 4.57  4.72  3.87   Hemoglobin 11.7 - 15.5 g/dL 40.1  02.7  25.3   HCT 35.0 - 45.0 % 45.8  47.8  37.4   Platelets 140 - 400 Thousand/uL 337  409  311   NEUT# 1,500 - 7,800 cells/uL 8,091        There is no height or weight on file to calculate BMI.  Orders:  No orders of the defined types were placed in this encounter.  No orders of the defined types were placed in this encounter.    Procedures: No procedures performed  Clinical Data: No additional findings.  ROS:  All other systems negative, except  as noted in the HPI. Review of Systems  Objective: Vital Signs: LMP 04/27/2021   Specialty Comments:  No specialty comments available.  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, with necrosis of bone (HCC)    Diabetes mellitus type 2, uncontrolled, with complications 06/26/2021   Diabetic nephropathy (HCC) 06/26/2021   CKD (chronic kidney disease), stage III (HCC) 06/26/2021   HTN (hypertension) 06/24/2021   HIV (human immunodeficiency virus infection) (HCC) 06/24/2021   DM2 (diabetes mellitus, type 2)  (HCC) 06/24/2021   HLD (hyperlipidemia) 06/24/2021   Osteomyelitis of left foot (HCC) 06/24/2021   Elevated LFTs 06/24/2021   Acute-on-chronic kidney injury (HCC) 06/24/2021   Osteomyelitis of foot (HCC) 04/04/2019   Hardware complicating wound infection (HCC)    Subacute osteomyelitis, left ankle and foot (HCC)    Open displaced fracture of fifth metatarsal bone of left foot    Healthcare maintenance 04/03/2019   HIV disease (HCC) 10/27/2015   DM type 2 (diabetes mellitus, type 2) (HCC) 10/27/2015   Hyperlipidemia 10/27/2015   Essential hypertension 10/27/2015   Severe vulvar dysplasia, histologically confirmed 10/15/2015   Past Medical History:  Diagnosis Date   Abnormal uterine bleeding (AUB)    Arthritis    knees, fingers   Asthma    as child, no problem since 1995   Bell's palsy 2004   CKD (chronic kidney disease), stage II    Complication of anesthesia    Congenital cardiomegaly    per pt has always been told since childhood and siblings    Diabetes mellitus without complication (HCC)    Dysrhythmia    Occasionally skipped beats   Elevated liver function tests    GERD (gastroesophageal reflux disease)    History of genital warts    HIV (human immunodeficiency virus infection) (HCC)    DX 1998--  MONITORED BY INFECTIOUS DISEASE-- DR Liane Redman   Hypercholesterolemia    Hypertension    Intermittent palpitations    mild-- no meds   Legally blind in left eye, as defined in [place]     trauma as child   Peripheral vascular disease (HCC)    Peroneal tendinitis of left lower extremity 03/08/2018   Pneumonia    PONV (postoperative nausea and vomiting)    Renal disorder    Tuberculosis    ? 2005 or 2007   Type 2 diabetes mellitus (HCC)    Type II    Uterine fibroid    VIN III (vulvar intraepithelial neoplasia III)    and Verrucoid lesion on the mons   Wears glasses     Family History  Problem Relation Age of Onset   Hypertension Mother    Diabetes Father     Cancer Brother    Breast cancer Cousin     Past Surgical History:  Procedure Laterality Date   AMPUTATION Left 06/27/2023   Procedure: LEFT BELOW KNEE AMPUTATION;  Surgeon: Timothy Ford, MD;  Location: [facility] OR;  Service: Orthopedics;  Laterality: Left;   ANKLE FUSION Left 02/21/2023   Procedure: REVISION LEFT ANKLE FUSION;  Surgeon: Timothy Ford, MD;  Location: [facility] OR;  Service: Orthopedics;  Laterality: Left;   CESAREAN SECTION  2001  &  1984  CO2 LASER APPLICATION N/A 11/26/2015   Procedure: CO2 LASER APPLICATION AND WIDE VOCAL EXCSION OF LESION ON MONS PUBIS;  Surgeon: Valeen Gartner, MD;  Location: [facility] [place];  Service: Gynecology;  Laterality: N/A;   CASE CANCELLED    CO2 LASER APPLICATION N/A 01/07/2016   Procedure: CO2 LASER OF VULVAR;  Surgeon: Valeen Gartner, MD;  Location: [facility];  Service: Gynecology;  Laterality: N/A;   CORNEAL TRANSPLANT Left 12/18/2004   failed   D & C HYSTEROSCOPY /  RESECTION FIBROID  12/18/2002   DILATATION & CURETTAGE/HYSTEROSCOPY WITH MYOSURE N/A 10/01/2019   Procedure: DILATATION & CURETTAGE/HYSTEROSCOPY WITH MYOSURE and HYDROTHERMAL ABLATION;  Surgeon: Johnn Najjar, MD;  Location: [facility];  Service: Gynecology;  Laterality: N/A;  HTA rep will be here. Confirmed on 09/25/19 CS   EYE SURGERY     FOOT ARTHRODESIS Left 11/30/2021   Procedure: LEFT TIBIOCALCANEAL FUSION;  Surgeon: Timothy Ford, MD;  Location: [facility] OR;  Service: Orthopedics;  Laterality: Left;   HARDWARE REMOVAL Left 04/04/2019   Procedure: EXCISION BONE AND REMOVE DEEP HARDWARE LEFT 5TH METATARSAL;  Surgeon: Timothy Ford, MD;  Location: [facility] OR;  Service: Orthopedics;  Laterality: Left;   HARDWARE REMOVAL Left 08/04/2022   Procedure: REMOVAL DEEP HARDWARE LEFT ANKLE;  Surgeon: Timothy Ford, MD;  Location: [facility] OR;  Service: Orthopedics;  Laterality: Left;   I & D EXTREMITY Left 06/27/2021   Procedure: IRRIGATION AND  DEBRIDEMENT FOOT ULCER;  Surgeon: Alphonso Jean, MD;  Location: [facility] OR;  Service: Orthopedics;  Laterality: Left;   KNEE ARTHROSCOPY W/ ACL RECONSTRUCTION Bilateral 12/18/2006   ORIF TOE FRACTURE Left 08/02/2017   Procedure: OPEN REDUCTION INTERNAL FIXATION (ORIF) FIFTH METATARSAL (TOE) BASE FRACTURE;  Surgeon: Amada Backer, MD;  Location: [facility];  Service: Orthopedics;  Laterality: Left;   VULVECTOMY N/A 01/07/2016   Procedure: WIDE LOCAL EXCISION;  Surgeon: Valeen Gartner, MD;  Location: [facility];  Service: Gynecology;  Laterality: N/A;  mons pubis as site   Social History   Occupational History   Not on file  Tobacco Use   Smoking status: Never    Passive exposure: Never   Smokeless tobacco: Never  Vaping Use   Vaping status: Never Used  Substance and Sexual Activity   Alcohol use: No    Alcohol/week: 0.0 standard drinks of alcohol   Drug use: No   Sexual activity: Yes    Partners: Male    Birth control/protection: Condom, Post-menopausal

## 2024-05-29 DIAGNOSIS — Z89512 Acquired absence of left leg below knee: Secondary | ICD-10-CM | POA: Diagnosis not present

## 2024-05-29 DIAGNOSIS — E1165 Type 2 diabetes mellitus with hyperglycemia: Secondary | ICD-10-CM | POA: Diagnosis not present

## 2024-05-29 DIAGNOSIS — E1142 Type 2 diabetes mellitus with diabetic polyneuropathy: Secondary | ICD-10-CM | POA: Diagnosis not present

## 2024-05-29 DIAGNOSIS — N1831 Chronic kidney disease, stage 3a: Secondary | ICD-10-CM | POA: Diagnosis not present

## 2024-05-29 DIAGNOSIS — Z978 Presence of other specified devices: Secondary | ICD-10-CM | POA: Diagnosis not present

## 2024-05-29 DIAGNOSIS — I10 Essential (primary) hypertension: Secondary | ICD-10-CM | POA: Diagnosis not present

## 2024-05-29 DIAGNOSIS — Z794 Long term (current) use of insulin: Secondary | ICD-10-CM | POA: Diagnosis not present

## 2024-05-29 DIAGNOSIS — E7800 Pure hypercholesterolemia, unspecified: Secondary | ICD-10-CM | POA: Diagnosis not present

## 2024-05-29 DIAGNOSIS — Z0000 Encounter for general adult medical examination without abnormal findings: Secondary | ICD-10-CM | POA: Diagnosis not present

## 2024-05-29 DIAGNOSIS — H5440 Blindness, one eye, unspecified eye: Secondary | ICD-10-CM | POA: Diagnosis not present

## 2024-06-05 ENCOUNTER — Telehealth: Payer: Self-pay

## 2024-06-05 NOTE — Telephone Encounter (Signed)
 Christine from [employer] called regarding powered wheelchair. States 1st page was incomplete. Need to have patients name and what equipment(powered wheelchair) would like a CB to discuss.  CB [phone] Ext [phone]

## 2024-06-12 NOTE — Telephone Encounter (Signed)
 Received completed and will fax back.

## 2024-07-24 ENCOUNTER — Telehealth: Payer: Self-pay | Admitting: Pharmacist

## 2024-07-24 NOTE — Progress Notes (Signed)
    07/24/2024 Name: Katrina Wolfe MRN: [id number] DOB: [birth year]  Chief Complaint  Patient presents with   Diabetes    Katrina Wolfe is a 58 y.o. year old female who presented for a telephone visit.   They were referred to the pharmacist by a quality report for assistance in managing diabetes.   TNM Diabetes patient  Subjective:  Care Team: Primary Care Provider: Ransom Other, MD ; Next Scheduled Visit: 10/31/24 Clinical Pharmacist: Aloysius Lewis, PharmD  Medication Access/Adherence  Current Pharmacy:  [facility] [place], [place] - [address] [place] [address] Phone: [phone] Fax: [phone]  [id number] - [place], [place] - [address] AT [facility] [address] [place] [address] Phone: [phone] Fax: [phone]   Patient reports affordability concerns with their medications: No  Patient reports access/transportation concerns to their pharmacy: No  Patient reports adherence concerns with their medications:  No     Diabetes:  Current medications: Glimepiride  2mg  daily, Ozempic 2mg  weekly, Pioglitazone  30mg  daily, Humalog  sliding scale, Humalog  75/25- 30 in AM + 20 in PM Medications tried in the past: Trulicity  4.5mg  (2024), Farxiga  (2023- yeast infx), Jardiance  (2024- yeast infx)  Using Dexcom G6 reader Advised to write information down  Patient denies hypoglycemic s/sx including dizziness, shakiness, sweating. Patient denies hyperglycemic symptoms including polyuria, polydipsia, polyphagia, nocturia, neuropathy, blurred vision.  Current medication access support: [facility] D-SNP plan  A1c 05/29/24: 8.2%  Objective:  Lab Results  Component Value Date   HGBA1C 7.7 (H) 06/27/2023    Lab Results  Component Value Date   CREATININE 1.15 (H) 04/21/2024   BUN 13 04/21/2024   NA 138 04/21/2024   K 4.1 04/21/2024   CL 102 04/21/2024   CO2 23  04/21/2024    Lab Results  Component Value Date   CHOL 110 04/21/2024   HDL 39 (L) 04/21/2024   LDLCALC 52 04/21/2024   TRIG 104 04/21/2024   CHOLHDL 2.8 04/21/2024    Medications Reviewed Today   Medications were not reviewed in this encounter       Assessment/Plan:   Diabetes: - Currently uncontrolled - Reviewed long term cardiovascular and renal outcomes of uncontrolled blood sugar - Reviewed goal A1c, goal fasting, and goal 2 hour post prandial glucose - Uses Dexcom G6 reader to track and does NOT have a Dexcom Clarity account    Follow Up Plan:  - Follow-up on 8/21 at 11AM for BG readings - Patient was reporting lows at prior visit with Dr. Husain on 6/12, therefore Humalog  was changed to 30U in AM + 20U in PM (instead of 35U twice a day prior) - Advised to write down fasting BG readings and whenever she has lows (the time, BG reading, and how many units taken prior)  Patient to be signed up for RPM through [employer] for DM + BP soon; not signed up yet   Aloysius Lewis, PharmD [facility]  Phone Number: [phone]

## 2024-07-25 ENCOUNTER — Telehealth: Payer: Self-pay

## 2024-08-07 ENCOUNTER — Telehealth: Payer: Self-pay | Admitting: Pharmacist

## 2024-08-07 NOTE — Progress Notes (Signed)
    08/07/2024 Name: Katrina Wolfe MRN: [id number] DOB: 1966/03/10  Chief Complaint  Patient presents with   Diabetes    Katrina Wolfe is a 58 y.o. year old female who presented for a telephone visit.   They were referred to the pharmacist by a quality report for assistance in managing diabetes.   TNM Diabetes patient  Subjective:  Care Team: Primary Care Provider: Ransom Other, MD ; Next Scheduled Visit: 10/31/24 Clinical Pharmacist: Aloysius Lewis, PharmD  Medication Access/Adherence  Current Pharmacy:  [facility] [place], [place] - [address] [place] [address] Phone: [phone] Fax: [phone]  [id number] - [place], [place] - [address] AT [facility] [address] [place] [address] Phone: [phone] Fax: [phone]   Patient reports affordability concerns with their medications: No  Patient reports access/transportation concerns to their pharmacy: No  Patient reports adherence concerns with their medications:  No     Diabetes:  Current medications: Glimepiride  2mg  daily, Ozempic 2mg  weekly, Pioglitazone  30mg  daily,  Humalog  75/25- 30 in AM + 20 in PM Medications tried in the past: Trulicity  4.5mg  (2024), Farxiga  (2023- yeast infx), Jardiance  (2024- yeast infx)  Using Dexcom G6 reader Advised to write information down  Readings + Time from 8/21 visit: 67 7:16AM 153 10AM 118 9:17AM 102 9:47AM *57 10:12PM 8/13 69 10:54AM *47 5:42AM 8/14 138 7:13AM 133 9:17AM 125 8:15AM 194 7:30AM 123 9:30AM 122 8:30AM  Patient denies hypoglycemic s/sx including dizziness, shakiness, sweating. Patient denies hyperglycemic symptoms including polyuria, polydipsia, polyphagia, nocturia, neuropathy, blurred vision.  Current medication access support: [facility] D-SNP plan  A1c 05/29/24: 8.2%  Objective:  Lab Results  Component Value Date   HGBA1C 7.7 (H)  06/27/2023    Lab Results  Component Value Date   CREATININE 1.15 (H) 04/21/2024   BUN 13 04/21/2024   NA 138 04/21/2024   K 4.1 04/21/2024   CL 102 04/21/2024   CO2 23 04/21/2024    Lab Results  Component Value Date   CHOL 110 04/21/2024   HDL 39 (L) 04/21/2024   LDLCALC 52 04/21/2024   TRIG 104 04/21/2024   CHOLHDL 2.8 04/21/2024    Medications Reviewed Today   Medications were not reviewed in this encounter       Assessment/Plan:   Diabetes: - Currently uncontrolled - Reviewed long term cardiovascular and renal outcomes of uncontrolled blood sugar - Reviewed goal A1c, goal fasting, and goal 2 hour post prandial glucose - Uses Dexcom G6 reader to track and does NOT have a Dexcom Clarity account    Follow Up Plan:  - Follow-up 9/25 for more low BG tracking information - Readings have been higher since switching the sensor; no changes at this time (could have used a faulty sensor prior) - Advised low readings at night can be from sleeping on the sensor- consider switching positions first if this occurs; symptomatic with lows (gets shakey) - Pain on left side last night- said it was related to calcium  stones; will consider PCP visit or urgent care if pain worsens  - Provided GI's phone number to schedule follow-up visit if needed for gallstone concerns  Patient to be signed up for RPM through [employer] for DM + BP soon; not signed up yet   Aloysius Lewis, PharmD [facility]  Phone Number: [phone]

## 2024-09-11 ENCOUNTER — Telehealth: Payer: Self-pay | Admitting: Pharmacist

## 2024-09-11 NOTE — Progress Notes (Signed)
    09/11/2024 Name: Katrina Wolfe MRN: [id number] DOB: 02-13-1966  Chief Complaint  Patient presents with   Diabetes    Katrina Wolfe is a 58 y.o. year old female who presented for a telephone visit.   They were referred to the pharmacist by a quality report for assistance in managing diabetes.   TNM Diabetes patient  Subjective:  Care Team: Primary Care Provider: Ransom Other, MD ; Next Scheduled Visit: 10/31/24 Clinical Pharmacist: Aloysius Lewis, PharmD  Medication Access/Adherence  Current Pharmacy:  [facility] [place] [facility] [place], [place] - [address] [place] [address] Phone: [phone] Fax: [phone]  [id number] - [place], [place] - [address] AT [facility] [address] [place] [address] Phone: [phone] Fax: [phone]   Patient reports affordability concerns with their medications: No  Patient reports access/transportation concerns to their pharmacy: No  Patient reports adherence concerns with their medications:  No     Diabetes:  Current medications: Glimepiride  2mg  daily, Ozempic 2mg  weekly, Pioglitazone  30mg  daily,  Humalog  75/25- 30 in AM + 15 in PM Medications tried in the past: Trulicity  4.5mg  (2024), Farxiga  (2023- yeast infx), Jardiance  (2024- yeast infx)  Using Dexcom G6 reader Advised to write information down  Readings + Time from 8/21 visit: 67 7:16AM 153 10AM 118 9:17AM 102 9:47AM *57 10:12PM 8/13 69 10:54AM *47 5:42AM 8/14 138 7:13AM 133 9:17AM 125 8:15AM 194 7:30AM 123 9:30AM 122 8:30AM  Patient denies hypoglycemic s/sx including dizziness, shakiness, sweating. Patient denies hyperglycemic symptoms including polyuria, polydipsia, polyphagia, nocturia, neuropathy, blurred vision.  Current medication access support: [facility] D-SNP plan  A1c 05/29/24: 8.2%  Objective:  Lab Results  Component Value Date   HGBA1C 7.7 (H)  06/27/2023    Lab Results  Component Value Date   CREATININE 1.15 (H) 04/21/2024   BUN 13 04/21/2024   NA 138 04/21/2024   K 4.1 04/21/2024   CL 102 04/21/2024   CO2 23 04/21/2024    Lab Results  Component Value Date   CHOL 110 04/21/2024   HDL 39 (L) 04/21/2024   LDLCALC 52 04/21/2024   TRIG 104 04/21/2024   CHOLHDL 2.8 04/21/2024    Medications Reviewed Today   Medications were not reviewed in this encounter       Assessment/Plan:   Diabetes: - Currently uncontrolled - Reviewed long term cardiovascular and renal outcomes of uncontrolled blood sugar - Reviewed goal A1c, goal fasting, and goal 2 hour post prandial glucose - Uses Dexcom G6 reader to track and does NOT have a Dexcom Clarity account    Follow Up Plan:  - Follow-up 10/27 for more low BG tracking information - No lows at this time  - Self-decreased to 15U in PM since lows were occurring at the higher doses - Since this change, readings have ranged from 99 to 182  - Advised if around 182, needs to slowly increase the PM dose up by 2 units until readings are consistently <130 in the morning  Patient to be signed up for RPM through [employer] for DM + BP soon; not signed up yet   Aloysius Lewis, PharmD [facility]  Phone Number: [phone]

## 2024-10-15 ENCOUNTER — Telehealth: Payer: Self-pay | Admitting: Pharmacist

## 2024-10-15 NOTE — Progress Notes (Signed)
10/15/2024 Name: Katrina Wolfe MRN: [id number] DOB: 12-Aug-1966  Chief Complaint  Patient presents with   Diabetes    Katrina Wolfe is a 58 y.o. year old female who presented for a telephone visit.   They were referred to the pharmacist by a quality report for assistance in managing diabetes.   TNM Diabetes patient  Subjective:  Care Team: Primary Care Provider: Ransom Other, MD ; Next Scheduled Visit: 10/31/24 Clinical Pharmacist: Aloysius Lewis, PharmD  Medication Access/Adherence  Current Pharmacy:  [facility] [place]a GLENWOOD [place], [place] - [address] [place] [address] Phone: [phone] Fax: [phone]  [id number] [place], [place] - [address] AT [facility] [address] [place] [address] Phone: [phone] Fax: [phone]   Patient reports affordability concerns with their medications: No  Patient reports access/transportation concerns to their pharmacy: No  Patient reports adherence concerns with their medications:  No     Diabetes:  Current medications: Glimepiride  2mg  daily, Ozempic 2mg  weekly, Pioglitazone  30mg  daily,  Humalog  75/25- 25 in AM + 20 in PM Medications tried in the past: Trulicity  4.5mg  (2024), Farxiga  (2023- yeast infx), Jardiance  (2024- yeast infx)  Using Dexcom G6 reader- can only get even log unless able to download Advised to write information down  Readings info for 10/19: Lowest: 64 Highest: 302 this morning (had french fries last night) 155 during phone call- ate 2 hours ago Changed insulin  regimen from 30 in AM + 15 in PM to 25 in AM + 20 in PM about a week ago after low occurred    Readings + Time from 8/21 visit: 67 7:16AM 153 10AM 118 9:17AM 102 9:47AM *57 10:12PM 8/13 69 10:54AM *47 5:42AM 8/14 138 7:13AM 133 9:17AM 125 8:15AM 194 7:30AM 123 9:30AM 122 8:30AM  Patient denies hypoglycemic s/sx  including dizziness, shakiness, sweating. Patient denies hyperglycemic symptoms including polyuria, polydipsia, polyphagia, nocturia, neuropathy, blurred vision.  Current medication access support: [facility] D-SNP plan  A1c 05/29/24: 8.2%  Objective:  Lab Results  Component Value Date   HGBA1C 7.7 (H) 06/27/2023    Lab Results  Component Value Date   CREATININE 1.15 (H) 04/21/2024   BUN 13 04/21/2024   NA 138 04/21/2024   K 4.1 04/21/2024   CL 102 04/21/2024   CO2 23 04/21/2024    Lab Results  Component Value Date   CHOL 110 04/21/2024   HDL 39 (L) 04/21/2024   LDLCALC 52 04/21/2024   TRIG 104 04/21/2024   CHOLHDL 2.8 04/21/2024    Medications Reviewed Today   Medications were not reviewed in this encounter       Assessment/Plan:   Diabetes: - Currently uncontrolled - Reviewed long term cardiovascular and renal outcomes of uncontrolled blood sugar - Reviewed goal A1c, goal fasting, and goal 2 hour post prandial glucose - Uses Dexcom G6 reader to track and does NOT have a Dexcom Clarity account    Follow Up Plan:  - Follow-up 11/26 for BG readings  - Self-adjusted insulin  about 1 week ago- no lows since then - Will be seeing Dr. Husain in about 2 weeks, so will see how A1c returns at that point  - Won't be at the office on Friday to download the report unfortunately  Patient to be signed up for RPM through [employer] for DM + BP soon; not signed up yet   Aloysius Lewis, PharmD, BCACP  & [facility] Phone Number:  [phone]  

## 2024-10-20 ENCOUNTER — Encounter: Payer: Self-pay | Admitting: Radiology

## 2024-10-22 ENCOUNTER — Telehealth: Payer: Self-pay

## 2024-10-22 NOTE — Telephone Encounter (Signed)
 Tc received form [employer] today regarding need to get client's insulin  as she has been out of her medications for several days because her electricity was off and insulin  became warm and she felt it was no longer good.medication . Staff concerned . CCN stated she would check with medical director at [facility] to get her seen there and get her back on her meds . After several calls to connect client with care . CCN learned that client had been picked up by her transport service and was no longer at the [facility]  as arrangements /referral to medical [place] on [place]  was going to be [facility] would follow up with client at home to secure an appointment. Nurse called client at home and she had no transportation for today .CCN then made several calls to identify her PCP .Client is seen at [facility] Internal medicine .CCN  will follow up with her PCP. TC to PCP office left message to call client about  her need for insulin .  TC call made again to client states her PCP office did call her and will get back with her after consulting with her PCP regarding her insulin  need . Client states she has an appointment for follow up 10-31-24  CCN counseled regarding blood sugar readings and importance of getting back on her meds . She will call back to her PCP's office if she doesn't hear back. CCN will follow up later. Client thanked CCN for her assistance and calls

## 2024-10-28 NOTE — Telephone Encounter (Signed)
 TC to follow up with client needing her insulin . CCN received a call from the [facility] [employer] stating client was on site waiting on her transport pick up made staff ware that she had been out of her insulin  for several days because her utilities were off and had no way to keep her insulin  cold . Nurse made several calls to get client seen and identified her PCP [facility] Internal medicine . TC to [place] asking them to assist client with insulin  need. .Later call made back to client and Katrina Wolfe had made contact and will follow up after consult with MD what to do regarding he r medication refill CCN informed client if she didn't hear back to call  her PCP again .CCN will follow up next week . Client is checking her blood glucose levels and understands seriousness of too low or high readings. Understands what to look for.. Has CCN number will call if needed.

## 2024-11-05 ENCOUNTER — Other Ambulatory Visit: Payer: Self-pay

## 2024-11-05 ENCOUNTER — Ambulatory Visit: Admitting: Internal Medicine

## 2024-11-05 ENCOUNTER — Encounter: Payer: Self-pay | Admitting: Internal Medicine

## 2024-11-05 VITALS — BP 128/85 | HR 67 | Temp 98.7°F

## 2024-11-05 DIAGNOSIS — Z79899 Other long term (current) drug therapy: Secondary | ICD-10-CM | POA: Diagnosis not present

## 2024-11-05 DIAGNOSIS — Z23 Encounter for immunization: Secondary | ICD-10-CM

## 2024-11-05 DIAGNOSIS — E786 Lipoprotein deficiency: Secondary | ICD-10-CM

## 2024-11-05 DIAGNOSIS — E1100 Type 2 diabetes mellitus with hyperosmolarity without nonketotic hyperglycemic-hyperosmolar coma (NKHHC): Secondary | ICD-10-CM

## 2024-11-05 DIAGNOSIS — E119 Type 2 diabetes mellitus without complications: Secondary | ICD-10-CM

## 2024-11-05 DIAGNOSIS — E785 Hyperlipidemia, unspecified: Secondary | ICD-10-CM

## 2024-11-05 DIAGNOSIS — B20 Human immunodeficiency virus [HIV] disease: Secondary | ICD-10-CM | POA: Diagnosis not present

## 2024-11-05 DIAGNOSIS — Z794 Long term (current) use of insulin: Secondary | ICD-10-CM

## 2024-11-05 NOTE — Progress Notes (Signed)
 Patient ID: Katrina Wolfe, female   DOB: May 12, 1966, 58 y.o.   MRN: [id number]  HPI Katrina Wolfe is a 58yo F with hiv disease on dovato , cd 4 count of 830/VL<20  ; hx of left bka. Doing well.   Low vision of left eye - there is a cyst about her eye- seen at [facility]-- tylenol  500mg  for pain   Follows up dr husain for recent hypoglycemia. Now down to 15 am and 20 pm -- now better glucose management, not having hypoglycemia.  Outpatient Encounter Medications as of 11/05/2024  Medication Sig   acetaminophen  (TYLENOL ) 500 MG tablet Take 1,000 mg by mouth every 6 (six) hours as needed for moderate pain.   amLODipine  (NORVASC ) 5 MG tablet Take 5 mg by mouth in the morning.   aspirin  EC 81 MG tablet Take 81 mg by mouth daily. Swallow whole.   calcium  carbonate (TUMS - DOSED IN MG ELEMENTAL CALCIUM ) 500 MG chewable tablet Chew 1 tablet by mouth as needed for indigestion or heartburn.   Calcium  Carbonate-Vitamin D 600-200 MG-UNIT TABS Take 1 tablet by mouth daily.   COMFORT EZ PEN NEEDLES 31G X 8 MM MISC USE WITH insulin  THREE TIMES DAILY AS DIRECTED   Continuous Blood Gluc Receiver (DEXCOM G6 RECEIVER) DEVI as directed 30 days   Continuous Glucose Sensor (DEXCOM G6 SENSOR) MISC Apply topically every other day.   Continuous Glucose Transmitter (DEXCOM G6 TRANSMITTER) MISC USE AS DIRECTED EVERY 90 DAYS   diphenhydrAMINE (BENADRYL) 25 MG tablet Take 25 mg by mouth every 6 (six) hours as needed for allergies (or allergic reactions).   dolutegravir -lamiVUDine  (DOVATO ) 50-300 MG tablet TAKE ONE TABLET BY MOUTH EVERYDAY AT BEDTIME   gabapentin  (NEURONTIN ) 100 MG capsule Take 100 mg by mouth 4 (four) times daily.   glimepiride  (AMARYL ) 2 MG tablet Take 2 mg by mouth daily with breakfast.   HUMALOG  MIX 75/25 KWIKPEN (75-25) 100 UNIT/ML Kwikpen Inject 45 Units into the skin 2 (two) times daily. Breakfast and Dinner   Insulin  Syringe-Needle U-100 (INSULIN  SYRINGE 1CC/31GX5/16) 31G X 5/16 1 ML MISC 1 Units  by Does not apply route 2 (two) times daily.   Lancets (ONETOUCH ULTRASOFT) lancets    lisinopril -hydrochlorothiazide  (ZESTORETIC ) 20-25 MG tablet Take 1 tablet by mouth daily.   methocarbamol  (ROBAXIN ) 500 MG tablet TAKE 1 TABLET BY MOUTH EVERY 8 HOURS AS NEEDED FOR MUSCLE SPASMS   norethindrone  (AYGESTIN ) 5 MG tablet Take 5 mg by mouth in the morning and at bedtime.   nystatin  (MYCOSTATIN /NYSTOP ) powder Apply 1 Application topically 3 (three) times daily.   ONE TOUCH ULTRA TEST test strip    pantoprazole  (PROTONIX ) 20 MG tablet Take 20 mg by mouth every morning.   pioglitazone  (ACTOS ) 30 MG tablet Take 30 mg by mouth every morning.   rosuvastatin  (CRESTOR ) 5 MG tablet Take 5 mg by mouth daily with lunch.   Semaglutide, 2 MG/DOSE, (OZEMPIC, 2 MG/DOSE,) 8 MG/3ML SOPN Inject into the skin.   traMADol  (ULTRAM ) 50 MG tablet Take 50 mg by mouth 3 (three) times daily as needed.   Dulaglutide  (TRULICITY ) 4.5 MG/0.5ML SOPN Inject 4.5 mg into the skin every Friday. (Patient not taking: Reported on 11/05/2024)   HUMALOG  KWIKPEN 100 UNIT/ML KwikPen SMARTSIG:10-15 Unit(s) SUB-Q Daily (Patient not taking: Reported on 11/05/2024)   oxyCODONE  (OXY IR/ROXICODONE ) 5 MG immediate release tablet Take 1 tablet (5 mg total) by mouth every 4 (four) hours as needed for severe pain. (Patient not taking: Reported on 11/05/2024)   No  facility-administered encounter medications on file as of 11/05/2024.     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, with necrosis of bone (HCC)    Diabetes mellitus type 2, uncontrolled, with complications 06/26/2021   Diabetic nephropathy (HCC) 06/26/2021   CKD (chronic kidney disease), stage III (HCC)  06/26/2021   HTN (hypertension) 06/24/2021   HIV (human immunodeficiency virus infection) (HCC) 06/24/2021   DM2 (diabetes mellitus, type 2) (HCC) 06/24/2021   HLD (hyperlipidemia) 06/24/2021   Osteomyelitis of left foot (HCC) 06/24/2021   Elevated LFTs 06/24/2021   Acute-on-chronic kidney injury 06/24/2021   Osteomyelitis of foot (HCC) 04/04/2019   Hardware complicating wound infection    Subacute osteomyelitis, left ankle and foot (HCC)    Open displaced fracture of fifth metatarsal bone of left foot    Healthcare maintenance 04/03/2019   HIV disease (HCC) 10/27/2015   DM type 2 (diabetes mellitus, type 2) (HCC) 10/27/2015   Hyperlipidemia 10/27/2015   Essential hypertension 10/27/2015   Severe vulvar dysplasia, histologically confirmed 10/15/2015     Health Maintenance Due  Topic Date Due   COVID-19 Vaccine (1) Never done   FOOT EXAM  Never done   Hepatitis B Vaccines 19-59 Average Risk (1 of 3 - 19+ 3-dose series) Never done   Zoster Vaccines- Shingrix (1 of 2) 08/16/1985   Pneumococcal Vaccine: 50+ Years (3 of 3 - PCV20 or PCV21) 10/26/2020   Diabetic kidney evaluation - Urine ACR  05/17/2023   OPHTHALMOLOGY EXAM  08/30/2023   HEMOGLOBIN A1C  12/28/2023   Influenza Vaccine  07/18/2024     Review of Systems 12 point ros is otherwise negative except for hypoglycemia Physical Exam   BP 128/85   Pulse 67   Temp 98.7 F (37.1 C) (Oral)   LMP 04/27/2021   SpO2 98%   Physical Exam  Constitutional:  oriented to person, place, and time. appears well-developed and well-nourished. No distress.  HENT: [place]/AT, PERRLA, no scleral icterus Mouth/Throat: Oropharynx is clear and moist. No oropharyngeal exudate.  Cardiovascular: Normal rate, regular rhythm and normal heart sounds. Exam reveals no gallop and no friction rub.  No murmur heard.  Pulmonary/Chest: Effort normal and breath sounds normal. No respiratory distress.  has no wheezes.  Neck = supple, no nuchal  rigidity Abdominal: Soft. Bowel sounds are normal.  exhibits no distension. There is no tenderness.  Lymphadenopathy: no cervical adenopathy. No axillary adenopathy Neurological: alert and oriented to person, place, and time.  Skin: Skin is warm and dry. No rash noted. No erythema.  Psychiatric: a normal mood and affect.  behavior is normal.   Lab Results  Component Value Date   CD4TCELL 33 04/21/2024   Lab Results  Component Value Date   CD4TABS 739 10/22/2023   CD4TABS 698 04/19/2023   CD4TABS 575 10/17/2022   Lab Results  Component Value Date   HIV1RNAQUANT NOT DETECTED 04/21/2024   Lab Results  Component Value Date   HEPBSAB NEG 10/13/2015   Lab Results  Component Value Date   LABRPR NON-REACTIVE 04/21/2024    CBC Lab Results  Component Value Date   WBC 11.3 (H) 04/21/2024   RBC 4.57 04/21/2024   HGB 15.3 04/21/2024   HCT 45.8 (H)  04/21/2024   PLT 337 04/21/2024   MCV 100.2 (H) 04/21/2024   MCH 33.5 (H) 04/21/2024   MCHC 33.4 04/21/2024   RDW 12.5 04/21/2024   LYMPHSABS 2.3 06/27/2023   MONOABS 0.7 06/27/2023   EOSABS 181 04/21/2024    BMET Lab Results  Component Value Date   NA 138 04/21/2024   K 4.1 04/21/2024   CL 102 04/21/2024   CO2 23 04/21/2024   GLUCOSE 147 (H) 04/21/2024   BUN 13 04/21/2024   CREATININE 1.15 (H) 04/21/2024   CALCIUM  9.8 04/21/2024   GFRNONAA 51 (L) 03/08/2024   GFRAA 48 (L) 02/22/2021      Assessment and Plan Hiv disease= will check labs to see that she is well controlled with cd 4 count and hiv viral load  Long term medication management = will check cbc an bmp to see still at baseline while on dovato   Low hdl (39) = unable to take artovastatin due to LFTs. Abn; recommend diet modification if possible but has food allergy  Hypoglycemia (T2DM on insulin ) = had recent changes to regimen to improve better BS control  Deferred vaccines

## 2024-11-06 LAB — T-HELPER CELL (CD4) - (RCID CLINIC ONLY)
CD4 % Helper T Cell: 32 % — ABNORMAL LOW (ref 33–65)
CD4 T Cell Abs: 721 /uL (ref 400–1790)

## 2024-11-07 LAB — COMPLETE METABOLIC PANEL WITHOUT GFR
AG Ratio: 1.4 (calc) (ref 1.0–2.5)
ALT: 12 U/L (ref 6–29)
AST: 13 U/L (ref 10–35)
Albumin: 3.9 g/dL (ref 3.6–5.1)
Alkaline phosphatase (APISO): 28 U/L — ABNORMAL LOW (ref 37–153)
BUN/Creatinine Ratio: 15 (calc) (ref 6–22)
BUN: 18 mg/dL (ref 7–25)
CO2: 22 mmol/L (ref 20–32)
Calcium: 9.3 mg/dL (ref 8.6–10.4)
Chloride: 105 mmol/L (ref 98–110)
Creat: 1.18 mg/dL — ABNORMAL HIGH (ref 0.50–1.03)
Globulin: 2.8 g/dL (ref 1.9–3.7)
Glucose, Bld: 104 mg/dL — ABNORMAL HIGH (ref 65–99)
Potassium: 3.9 mmol/L (ref 3.5–5.3)
Sodium: 141 mmol/L (ref 135–146)
Total Bilirubin: 0.5 mg/dL (ref 0.2–1.2)
Total Protein: 6.7 g/dL (ref 6.1–8.1)

## 2024-11-07 LAB — CBC WITH DIFFERENTIAL/PLATELET
Absolute Lymphocytes: 2498 {cells}/uL (ref 850–3900)
Absolute Monocytes: 706 {cells}/uL (ref 200–950)
Basophils Absolute: 34 {cells}/uL (ref 0–200)
Basophils Relative: 0.3 %
Eosinophils Absolute: 179 {cells}/uL (ref 15–500)
Eosinophils Relative: 1.6 %
HCT: 45 % (ref 35.0–45.0)
Hemoglobin: 15.4 g/dL (ref 11.7–15.5)
MCH: 34.4 pg — ABNORMAL HIGH (ref 27.0–33.0)
MCHC: 34.2 g/dL (ref 32.0–36.0)
MCV: 100.4 fL — ABNORMAL HIGH (ref 80.0–100.0)
MPV: 11.1 fL (ref 7.5–12.5)
Monocytes Relative: 6.3 %
Neutro Abs: 7784 {cells}/uL (ref 1500–7800)
Neutrophils Relative %: 69.5 %
Platelets: 368 Thousand/uL (ref 140–400)
RBC: 4.48 Million/uL (ref 3.80–5.10)
RDW: 12.5 % (ref 11.0–15.0)
Total Lymphocyte: 22.3 %
WBC: 11.2 Thousand/uL — ABNORMAL HIGH (ref 3.8–10.8)

## 2024-11-07 LAB — HIV-1 RNA QUANT-NO REFLEX-BLD
HIV 1 RNA Quant: NOT DETECTED {copies}/mL
HIV-1 RNA Quant, Log: NOT DETECTED {Log_copies}/mL

## 2024-11-07 LAB — SYPHILIS: RPR W/REFLEX TO RPR TITER AND TREPONEMAL ANTIBODIES, TRADITIONAL SCREENING AND DIAGNOSIS ALGORITHM: RPR Ser Ql: NONREACTIVE

## 2024-11-12 ENCOUNTER — Telehealth: Payer: Self-pay | Admitting: Pharmacist

## 2024-11-12 NOTE — Progress Notes (Signed)
   11/12/2024  Patient ID: [name], female   DOB: 24-Jul-1966, 58 y.o.   MRN: [id number]  Called and spoke with the patient on the phone this morning.  Diabetes:  Current medications: Glipizide ER 5mg  daily (coming next week), Ozempic 2mg  weekly, Pioglitazone  30mg  daily, Humalog  75/25- 25 in AM + 20 in PM Medications tried in the past: Trulicity  4.5mg  (2024), Farxiga  (2023- yeast infx), Jardiance  (2024- yeast infx)  Using Dexcom G6 reader- can only get even log unless able to download Advised to write information down  **Summary for PCP:** - Follow-up 12/5 for BG readings  - Reports fasting BG was 170 this morning- advised goal is <130  - Advised to start writing fasting BG's down for us  to review on 12/5  - Just got her sensors re-applied after waiting on shipment - Discussed how to take insulin  properly, which is 15-30 minutes prior to eating to avoid mealtime peaks   Aloysius Lewis, PharmD, [facility] & [facility] Phone Number: [phone]

## 2024-11-21 ENCOUNTER — Telehealth: Payer: Self-pay | Admitting: Pharmacist

## 2024-11-21 NOTE — Progress Notes (Signed)
   11/21/2024  Patient ID: [name], female   DOB: 19-Jan-1966, 58 y.o.   MRN: [id number]  Called and spoke with the patient on the phone this morning. Was not home during the time of our call, so wasn't able to get as much information as preferred.  Diabetes:  Current medications: Glipizide ER 5mg  daily (coming next shipment), Ozempic 2mg  weekly, Pioglitazone  30mg  daily, Humalog  75/25- 25 in AM + 20 in PM Medications tried in the past: Trulicity  4.5mg  (2024), Farxiga  (2023- yeast infx), Jardiance  (2024- yeast infx)  Using Dexcom G6 reader- can only get even log unless able to download Advised to write information down  **Summary for PCP:** - Follow-up 1/2 for BG readings  - Reports fasting BG's have been 138 to 177 lately- advised goal is <130  - Continue writing fasting BG readings down - Doing well since readjusting timing of mealtime insulin - not having as many peaks - Need to see how BG's look after adding Glipizide- should ship 12/11 - Requesting albuterol inhaler for history of bronchitis- sending message to PCP   Aloysius Lewis, PharmD, [facility] [place] & [facility] Phone Number: [phone]

## 2024-12-19 ENCOUNTER — Telehealth: Payer: Self-pay | Admitting: Pharmacist

## 2024-12-19 DIAGNOSIS — Z21 Asymptomatic human immunodeficiency virus [HIV] infection status: Secondary | ICD-10-CM

## 2024-12-19 NOTE — Progress Notes (Signed)
" ° °  12/19/2024  Patient ID: Katrina Wolfe, female   DOB: [birth year], 58 y.o.   MRN: [id number]  Called and spoke with the patient on the phone this morning. Reports she got the Lispro in the mail instead of the Humalog  75/25 she was taking. Due to this, has been taking the same dose, but just mealtime only. Reports needing a new script for both that and the Tramadol .   Also asked about getting 4 boxes of gloves with [employer]. Said the script was only sent in for 1 box and needs more.  Provided new card insurance information for [facility] on the phone, if needed for med shipment with [facility].  ID: [id number] BIN: [id number] PCN: [id number] GRP: [id number]  Regarding Norethindrone - advised script sent in to the pharmacy on 12/31, but won't arrive until 1/12 which is when it's due again.    Aloysius Lewis, PharmD, [facility] & [facility] Phone Number: [phone]  "

## 2024-12-25 NOTE — Progress Notes (Addendum)
" ° °  12/25/2024  Patient ID: Katrina Wolfe, female   DOB: Dec 31, 1965, 59 y.o.   MRN: [id number]  Called [facility]. Should have every script except for Dexcom G6, Dovato , and Ozempic 2mg . Can refill everything except the Dovato . Will need to call the specialty pharmacy line to get that set-up.  Sent in the Ozempic 2mg  and Dexcom G6 refills today by PCP.  Called specialty line at [phone] and said refills can be sent over to the normal [facility] fax number- will be transferred to Specialty pharmacy upon arrival. Sending message to Dr. Luiz to send in 4 fills, which is how many should've been left on the script until 05/05/2025.    Aloysius Lewis, PharmD, [facility] [place] & [facility] Phone Number: [phone]  "

## 2024-12-25 NOTE — Addendum Note (Signed)
 Addended by: Jamal Pavon M on: 12/25/2024 10:30 AM   Modules accepted: Orders

## 2024-12-29 ENCOUNTER — Other Ambulatory Visit: Payer: Self-pay

## 2024-12-29 MED ORDER — DOVATO 50-300 MG PO TABS: ORAL_TABLET | ORAL | 5 refills | Status: AC

## 2025-01-12 ENCOUNTER — Telehealth: Payer: Self-pay | Admitting: Pharmacist

## 2025-01-12 NOTE — Progress Notes (Signed)
" ° °  01/12/2025  Patient ID: Katrina Wolfe, female   DOB: 1966/03/21, 59 y.o.   MRN: [id number]  Called and spoke with the patient on the phone today for DM check-in. Reports lately, her highest blood sugar readings was 197 and lowest was 74.  131 AM, 167 night 121 AM, 147 night 137 AM, 74 night- believes she took her insulin  too early  Tries not to eat after 8PM. Continues with regimen of Novolog  70/30, Ozempic 2mg  weekly, Pioglitazone  30mg  daily, Glipizide ER 5mg  daily.  Asked about muscle relaxer. Advised this was shipped on 1/21, but likely hasn't arrived yet due to the weather. Should be coming though.  No other concerns at this time. Re-iterated upcoming lab and appointment times. No further concerns at this time. will see how A1c looks in February and follow-up afterwards.    Aloysius Lewis, PharmD, [facility] & [facility] Phone Number: [phone]   "

## 2025-01-15 ENCOUNTER — Encounter: Payer: Self-pay | Admitting: *Deleted

## 2025-01-15 ENCOUNTER — Telehealth: Payer: Self-pay | Admitting: Family

## 2025-01-15 NOTE — Progress Notes (Signed)
 Chelcie Estorga                                          MRN: [id number]   01/15/2025   The [facility] Quality Team Specialist reviewed this patient medical record for the purposes of chart review for care gap closure. The following were reviewed: chart review for care gap closure-glycemic status assessment.    VBCI Quality Team

## 2025-01-15 NOTE — Telephone Encounter (Signed)
 Katrina Wolfe with [facility] called wanting a refill of Methocarbamol  mg. Pharmacy is [facility]. Call back number is [phone].

## 2025-01-20 MED ORDER — METHOCARBAMOL 500 MG PO TABS: 500.0000 mg | ORAL_TABLET | Freq: Three times a day (TID) | ORAL | 10 refills | Status: AC | PRN

## 2025-01-20 NOTE — Telephone Encounter (Signed)
 done

## 2025-04-06 ENCOUNTER — Ambulatory Visit: Admitting: Internal Medicine
